# Patient Record
Sex: Male | Born: 1950
Health system: Southern US, Community
[De-identification: ages and names within clinical notes are randomized; demographics above are authoritative.]

## PROBLEM LIST (undated history)

## (undated) DIAGNOSIS — N179 Acute kidney failure, unspecified: Secondary | ICD-10-CM

## (undated) DIAGNOSIS — I10 Essential (primary) hypertension: Secondary | ICD-10-CM

## (undated) DIAGNOSIS — I219 Acute myocardial infarction, unspecified: Secondary | ICD-10-CM

## (undated) DIAGNOSIS — D649 Anemia, unspecified: Secondary | ICD-10-CM

## (undated) DIAGNOSIS — G629 Polyneuropathy, unspecified: Secondary | ICD-10-CM

## (undated) DIAGNOSIS — F32A Depression, unspecified: Secondary | ICD-10-CM

## (undated) DIAGNOSIS — M459 Ankylosing spondylitis of unspecified sites in spine: Secondary | ICD-10-CM

## (undated) DIAGNOSIS — I209 Angina pectoris, unspecified: Secondary | ICD-10-CM

## (undated) DIAGNOSIS — I2510 Atherosclerotic heart disease of native coronary artery without angina pectoris: Secondary | ICD-10-CM

## (undated) DIAGNOSIS — E785 Hyperlipidemia, unspecified: Secondary | ICD-10-CM

## (undated) DIAGNOSIS — M1990 Unspecified osteoarthritis, unspecified site: Secondary | ICD-10-CM

## (undated) DIAGNOSIS — H269 Unspecified cataract: Secondary | ICD-10-CM

## (undated) DIAGNOSIS — F419 Anxiety disorder, unspecified: Secondary | ICD-10-CM

## (undated) DIAGNOSIS — F329 Major depressive disorder, single episode, unspecified: Secondary | ICD-10-CM

## (undated) DIAGNOSIS — K219 Gastro-esophageal reflux disease without esophagitis: Secondary | ICD-10-CM

## (undated) HISTORY — PX: ACHILLES TENDON REPAIR: SUR1153

## (undated) HISTORY — DX: Acute kidney failure, unspecified: N17.9

## (undated) HISTORY — DX: Acute myocardial infarction, unspecified: I21.9

## (undated) HISTORY — PX: CORONARY ARTERY BYPASS GRAFT: SHX141

## (undated) HISTORY — PX: CARDIAC CATHETERIZATION: SHX172

## (undated) HISTORY — PX: CORONARY ANGIOPLASTY WITH STENT PLACEMENT: SHX49

## (undated) HISTORY — DX: Atherosclerotic heart disease of native coronary artery without angina pectoris: I25.10

## (undated) HISTORY — PX: TONSILLECTOMY: SUR1361

## (undated) HISTORY — DX: Hyperlipidemia, unspecified: E78.5

## (undated) HISTORY — DX: Anxiety disorder, unspecified: F41.9

## (undated) HISTORY — DX: Depression, unspecified: F32.A

## (undated) HISTORY — DX: Ankylosing spondylitis of unspecified sites in spine: M45.9

## (undated) HISTORY — DX: Major depressive disorder, single episode, unspecified: F32.9

## (undated) HISTORY — DX: Gastro-esophageal reflux disease without esophagitis: K21.9

## (undated) HISTORY — DX: Polyneuropathy, unspecified: G62.9

## (undated) HISTORY — DX: Unspecified cataract: H26.9

## (undated) HISTORY — PX: CATARACT EXTRACTION: SUR2

## (undated) HISTORY — PX: FINGER SURGERY: SHX640

## (undated) HISTORY — DX: Essential (primary) hypertension: I10

## (undated) HISTORY — DX: Anemia, unspecified: D64.9

---

## 1999-06-14 ENCOUNTER — Emergency Department (HOSPITAL_COMMUNITY): Admission: EM | Admit: 1999-06-14 | Discharge: 1999-06-14 | Payer: Self-pay | Admitting: Emergency Medicine

## 1999-06-24 ENCOUNTER — Emergency Department (HOSPITAL_COMMUNITY): Admission: EM | Admit: 1999-06-24 | Discharge: 1999-06-24 | Payer: Self-pay | Admitting: Emergency Medicine

## 2000-08-09 ENCOUNTER — Inpatient Hospital Stay (HOSPITAL_COMMUNITY): Admission: EM | Admit: 2000-08-09 | Discharge: 2000-08-14 | Payer: Self-pay | Admitting: Internal Medicine

## 2000-08-09 ENCOUNTER — Encounter: Payer: Self-pay | Admitting: Emergency Medicine

## 2000-09-09 ENCOUNTER — Encounter (HOSPITAL_COMMUNITY): Admission: RE | Admit: 2000-09-09 | Discharge: 2000-10-08 | Payer: Self-pay | Admitting: Psychiatry

## 2000-10-09 ENCOUNTER — Encounter (HOSPITAL_COMMUNITY): Admission: RE | Admit: 2000-10-09 | Discharge: 2000-10-27 | Payer: Self-pay | Admitting: Internal Medicine

## 2001-03-11 HISTORY — PX: KNEE ARTHROSCOPY W/ LASER: SHX1872

## 2004-05-02 ENCOUNTER — Ambulatory Visit: Payer: Self-pay | Admitting: Cardiology

## 2004-05-10 ENCOUNTER — Ambulatory Visit: Payer: Self-pay

## 2004-05-23 ENCOUNTER — Ambulatory Visit: Payer: Self-pay | Admitting: Cardiology

## 2004-05-23 ENCOUNTER — Inpatient Hospital Stay (HOSPITAL_COMMUNITY): Admission: AD | Admit: 2004-05-23 | Discharge: 2004-05-26 | Payer: Self-pay | Admitting: Cardiology

## 2004-06-04 ENCOUNTER — Ambulatory Visit: Payer: Self-pay | Admitting: Cardiology

## 2004-06-11 ENCOUNTER — Ambulatory Visit: Payer: Self-pay | Admitting: Cardiology

## 2004-09-18 ENCOUNTER — Ambulatory Visit: Payer: Self-pay | Admitting: Cardiology

## 2004-09-20 ENCOUNTER — Ambulatory Visit: Payer: Self-pay | Admitting: Cardiology

## 2004-11-14 ENCOUNTER — Ambulatory Visit: Payer: Self-pay

## 2004-11-14 ENCOUNTER — Ambulatory Visit: Payer: Self-pay | Admitting: Cardiology

## 2004-12-19 ENCOUNTER — Ambulatory Visit: Payer: Self-pay | Admitting: Cardiology

## 2005-06-17 ENCOUNTER — Ambulatory Visit: Payer: Self-pay | Admitting: Cardiology

## 2005-06-17 ENCOUNTER — Ambulatory Visit: Payer: Self-pay

## 2005-06-25 ENCOUNTER — Ambulatory Visit: Payer: Self-pay | Admitting: Cardiology

## 2006-06-09 ENCOUNTER — Ambulatory Visit: Payer: Self-pay

## 2006-06-09 LAB — CONVERTED CEMR LAB
Albumin: 3.7 g/dL (ref 3.5–5.2)
Bilirubin, Direct: 0.1 mg/dL (ref 0.0–0.3)
Direct LDL: 79.7 mg/dL
HDL: 46 mg/dL (ref >39.0)
Total Bilirubin: 0.7 mg/dL (ref 0.3–1.2)
Triglycerides: 237 mg/dL (ref 0–149)
VLDL: 47 mg/dL — ABNORMAL HIGH (ref 0–40)

## 2006-07-01 ENCOUNTER — Ambulatory Visit: Payer: Self-pay | Admitting: Cardiology

## 2006-07-08 ENCOUNTER — Ambulatory Visit: Payer: Self-pay | Admitting: Cardiology

## 2006-07-08 LAB — CONVERTED CEMR LAB
BUN: 8 mg/dL (ref 6–23)
Creatinine, Ser: 1 mg/dL (ref 0.4–1.5)
GFR calc non Af Amer: 82 mL/min
Potassium: 5.4 meq/L — ABNORMAL HIGH (ref 3.5–5.1)

## 2006-07-13 ENCOUNTER — Emergency Department (HOSPITAL_COMMUNITY): Admission: EM | Admit: 2006-07-13 | Discharge: 2006-07-13 | Payer: Self-pay | Admitting: Emergency Medicine

## 2006-07-16 ENCOUNTER — Encounter: Admission: RE | Admit: 2006-07-16 | Discharge: 2006-07-16 | Payer: Self-pay | Admitting: Sports Medicine

## 2007-06-08 ENCOUNTER — Ambulatory Visit: Payer: Self-pay

## 2007-06-08 LAB — CONVERTED CEMR LAB
ALT: 25 units/L (ref 0–53)
AST: 29 units/L (ref 0–37)
Albumin: 3.8 g/dL (ref 3.5–5.2)
Alkaline Phosphatase: 50 units/L (ref 39–117)
BUN: 9 mg/dL (ref 6–23)
CO2: 31 meq/L (ref 19–32)
Chloride: 105 meq/L (ref 96–112)
Cholesterol: 140 mg/dL (ref 0–200)
Creatinine, Ser: 0.9 mg/dL (ref 0.4–1.5)
GFR calc non Af Amer: 92 mL/min
Glucose, Bld: 86 mg/dL (ref 70–99)
LDL Cholesterol: 76 mg/dL (ref 0–99)
Total Protein: 7.3 g/dL (ref 6.0–8.3)
Triglycerides: 50 mg/dL (ref 0–149)

## 2007-06-26 ENCOUNTER — Ambulatory Visit: Payer: Self-pay | Admitting: Cardiology

## 2007-08-17 ENCOUNTER — Inpatient Hospital Stay (HOSPITAL_COMMUNITY): Admission: RE | Admit: 2007-08-17 | Discharge: 2007-08-18 | Payer: Self-pay | Admitting: Orthopedic Surgery

## 2008-06-15 DIAGNOSIS — I10 Essential (primary) hypertension: Secondary | ICD-10-CM | POA: Insufficient documentation

## 2008-06-15 DIAGNOSIS — K219 Gastro-esophageal reflux disease without esophagitis: Secondary | ICD-10-CM | POA: Insufficient documentation

## 2008-06-15 DIAGNOSIS — F329 Major depressive disorder, single episode, unspecified: Secondary | ICD-10-CM

## 2008-06-15 DIAGNOSIS — M459 Ankylosing spondylitis of unspecified sites in spine: Secondary | ICD-10-CM

## 2008-06-15 DIAGNOSIS — E785 Hyperlipidemia, unspecified: Secondary | ICD-10-CM | POA: Insufficient documentation

## 2008-06-15 DIAGNOSIS — F320 Major depressive disorder, single episode, mild: Secondary | ICD-10-CM

## 2008-06-15 DIAGNOSIS — I2510 Atherosclerotic heart disease of native coronary artery without angina pectoris: Secondary | ICD-10-CM

## 2008-06-15 HISTORY — DX: Major depressive disorder, single episode, mild: F32.0

## 2008-06-15 HISTORY — DX: Major depressive disorder, single episode, unspecified: F32.9

## 2008-06-16 ENCOUNTER — Ambulatory Visit: Payer: Self-pay | Admitting: Cardiology

## 2008-06-16 ENCOUNTER — Encounter: Payer: Self-pay | Admitting: Cardiology

## 2008-06-16 LAB — CONVERTED CEMR LAB
ALT: 23 units/L (ref 0–53)
AST: 27 units/L (ref 0–37)
Basophils Absolute: 0 10*3/uL (ref 0.0–0.1)
Bilirubin, Direct: 0 mg/dL (ref 0.0–0.3)
CO2: 29 meq/L (ref 19–32)
Calcium: 8.8 mg/dL (ref 8.4–10.5)
Chloride: 104 meq/L (ref 96–112)
Creatinine, Ser: 0.9 mg/dL (ref 0.4–1.5)
Direct LDL: 70.2 mg/dL
Eosinophils Relative: 6 % — ABNORMAL HIGH (ref 0.0–5.0)
Lymphocytes Relative: 38.9 % (ref 12.0–46.0)
Monocytes Relative: 12 % (ref 3.0–12.0)
Neutrophils Relative %: 42.6 % — ABNORMAL LOW (ref 43.0–77.0)
Platelets: 186 10*3/uL (ref 150.0–400.0)
RDW: 12.1 % (ref 11.5–14.6)
Sodium: 139 meq/L (ref 135–145)
TSH: 2.89 microintl units/mL (ref 0.35–5.50)
Total Bilirubin: 0.6 mg/dL (ref 0.3–1.2)
Total CHOL/HDL Ratio: 3
Triglycerides: 210 mg/dL — ABNORMAL HIGH (ref 0.0–149.0)
VLDL: 42 mg/dL — ABNORMAL HIGH (ref 0.0–40.0)
WBC: 3.6 10*3/uL — ABNORMAL LOW (ref 4.5–10.5)

## 2008-06-20 ENCOUNTER — Telehealth: Payer: Self-pay | Admitting: Cardiology

## 2008-06-21 ENCOUNTER — Telehealth: Payer: Self-pay | Admitting: Cardiology

## 2008-06-22 ENCOUNTER — Telehealth: Payer: Self-pay | Admitting: Cardiology

## 2008-06-27 ENCOUNTER — Telehealth: Payer: Self-pay | Admitting: Cardiology

## 2009-06-28 ENCOUNTER — Telehealth (INDEPENDENT_AMBULATORY_CARE_PROVIDER_SITE_OTHER): Payer: Self-pay

## 2009-06-29 ENCOUNTER — Encounter (HOSPITAL_COMMUNITY): Admission: RE | Admit: 2009-06-29 | Discharge: 2009-09-12 | Payer: Self-pay | Admitting: Cardiology

## 2009-06-29 ENCOUNTER — Ambulatory Visit: Payer: Self-pay | Admitting: Cardiology

## 2009-06-29 ENCOUNTER — Ambulatory Visit: Payer: Self-pay | Admitting: Internal Medicine

## 2009-06-29 ENCOUNTER — Ambulatory Visit: Payer: Self-pay

## 2009-07-05 ENCOUNTER — Telehealth: Payer: Self-pay | Admitting: Cardiology

## 2009-07-17 ENCOUNTER — Encounter: Payer: Self-pay | Admitting: Cardiology

## 2009-07-18 ENCOUNTER — Ambulatory Visit: Payer: Self-pay | Admitting: Cardiology

## 2010-01-22 ENCOUNTER — Ambulatory Visit: Payer: Self-pay | Admitting: Cardiology

## 2010-02-06 ENCOUNTER — Telehealth: Payer: Self-pay | Admitting: Cardiology

## 2010-02-06 LAB — CONVERTED CEMR LAB
ALT: 104 units/L — ABNORMAL HIGH (ref 0–53)
AST: 97 units/L — ABNORMAL HIGH (ref 0–37)
Alkaline Phosphatase: 62 units/L (ref 39–117)
Bilirubin, Direct: 0.3 mg/dL (ref 0.0–0.3)
Cholesterol: 158 mg/dL (ref 0–200)
Total Bilirubin: 1.1 mg/dL (ref 0.3–1.2)
Total Protein: 6.9 g/dL (ref 6.0–8.3)

## 2010-02-14 ENCOUNTER — Telehealth: Payer: Self-pay | Admitting: Cardiology

## 2010-03-28 ENCOUNTER — Encounter: Payer: Self-pay | Admitting: Cardiovascular Disease

## 2010-04-08 LAB — CONVERTED CEMR LAB
ALT: 59 units/L — ABNORMAL HIGH (ref 0–53)
AST: 44 units/L — ABNORMAL HIGH (ref 0–37)
Albumin: 3.7 g/dL (ref 3.5–5.2)
Alkaline Phosphatase: 48 units/L (ref 39–117)
CO2: 28 meq/L (ref 19–32)
Calcium: 9.2 mg/dL (ref 8.4–10.5)
Cholesterol: 135 mg/dL (ref 0–200)
Creatinine, Ser: 0.9 mg/dL (ref 0.4–1.5)
HDL: 60.8 mg/dL (ref >39.00)
Sodium: 142 meq/L (ref 135–145)
Total CHOL/HDL Ratio: 2
Total Protein: 7.1 g/dL (ref 6.0–8.3)
Triglycerides: 66 mg/dL (ref 0.0–149.0)

## 2010-04-09 ENCOUNTER — Other Ambulatory Visit: Payer: Self-pay | Admitting: Cardiovascular Disease

## 2010-04-09 ENCOUNTER — Ambulatory Visit
Admission: RE | Admit: 2010-04-09 | Discharge: 2010-04-09 | Payer: Self-pay | Source: Home / Self Care | Attending: Cardiovascular Disease | Admitting: Cardiovascular Disease

## 2010-04-09 LAB — HEPATIC FUNCTION PANEL
ALT: 63 U/L — ABNORMAL HIGH (ref 0–53)
Albumin: 3.4 g/dL — ABNORMAL LOW (ref 3.5–5.2)
Bilirubin, Direct: 0.1 mg/dL (ref 0.0–0.3)
Total Protein: 6.4 g/dL (ref 6.0–8.3)

## 2010-04-09 LAB — LIPID PANEL
Cholesterol: 136 mg/dL (ref 0–200)
HDL: 45.4 mg/dL (ref >39.00)
Triglycerides: 232 mg/dL — ABNORMAL HIGH (ref 0.0–149.0)

## 2010-04-09 LAB — PSA: PSA: 9.34 ng/mL — ABNORMAL HIGH (ref 0.10–4.00)

## 2010-04-12 NOTE — Progress Notes (Signed)
Summary: pt returning heather's call  Phone Note Call from Patient   Caller: Patient [phone] Reason for Call: Talk to Nurse Summary of Call: pt returning heather's call-pls call Initial call taken by: Glynda Jaeger,  February 14, 2010 3:41 PM  Follow-up for Phone Call        Pt. had question regarding starting medication for ED. I advised him that usually his PCP should see him for this problem but to be cautious b/c he should not take erectile dysfunction meds. with his nitrates. He is aware and will dicuss this with his PCP. Whitney Maeola Sarah RN  February 14, 2010 3:56 PM  Follow-up by: Whitney Maeola Sarah RN,  February 14, 2010 3:56 PM

## 2010-04-12 NOTE — Progress Notes (Signed)
Summary: returning call  Phone Note Call from Patient Call back at Home Phone [phone]   Caller: Patient Reason for Call: Talk to Nurse Summary of Call: returning call  Initial call taken by: Migdalia Dk,  July 05, 2009 1:55 PM  Follow-up for Phone Call        I called and spoke with the pt. Follow-up by: Sherri Rad, RN, BSN,  July 05, 2009 2:11 PM
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
## 2010-04-12 NOTE — Progress Notes (Signed)
Summary: pt returned call  Phone Note Call from Patient   Caller: Patient [phone] Reason for Call: Talk to Nurse Summary of Call: pt returned call Initial call taken by: Glynda Jaeger,  February 06, 2010 11:41 AM  Follow-up for Phone Call        I spoke with the pt. Follow-up by: Sherri Rad, RN, BSN,  February 06, 2010 12:09 PM

## 2010-04-12 NOTE — Miscellaneous (Signed)
Summary: added stress test results  Clinical Lists Changes  Observations: Added new observation of NUCLEAR NOS:   Overall Impression   Exercise Capacity: Good exercise capacity. BP Response: Hypertensive blood pressure response. Clinical Symptoms: No chest pain ECG Impression: No significant ST segment change suggestive of ischemia. Overall Impression: Normal stress nuclear study.  (06/29/2009 15:36)      Nuclear Study  Procedure date:  06/29/2009  Findings:        Overall Impression   Exercise Capacity: Good exercise capacity. BP Response: Hypertensive blood pressure response. Clinical Symptoms: No chest pain ECG Impression: No significant ST segment change suggestive of ischemia. Overall Impression: Normal stress nuclear study.

## 2010-04-12 NOTE — Progress Notes (Signed)
Summary: Nuc. Pre-Procedure  Phone Note Outgoing Call Call back at [facility] Phone [phone]   Call placed by: Irean Hong, RN,  June 28, 2009 3:29 PM Summary of Call: Reviewed information on Myoview Information Sheet (see scanned document for further details).  Spoke with patient. The patient reguested follow-up lab work tomorrow. I spoke with Dr. Leonard Schwartz Brodie's nurse, and Labs added for BMP, lipid and liver profile.My Madariaga,RN.     Nuclear Med Background Indications for Stress Test: Evaluation for Ischemia, Stent Patency, PTCA Patency   History: Angioplasty, Heart Catheterization, Myocardial Infarction, Myocardial Perfusion Study, Stents  History Comments: '02  STEMI>CFX Stent, and PTCA ostial CFX.  '06 Cath: EF=60%, CFX 90%,80%, RCA 95% > Stents CFX,RCA with residual LAD of 70%. 3/09 MPS:(-) ischemia, EF=62%.      Nuclear Pre-Procedure Cardiac Risk Factors: History of Smoking, Hypertension, Lipids Height (in): 74

## 2010-04-12 NOTE — Assessment & Plan Note (Signed)
Summary: Cardiology Nuclear Study  Nuclear Med Background Indications for Stress Test: Evaluation for Ischemia, Stent Patency, PTCA Patency   History: Angioplasty, Heart Catheterization, Myocardial Infarction, Myocardial Perfusion Study, Stents  History Comments: '02  STEMI>CFX Stent, and PTCA ostial CFX.  '06 Cath: EF=60%, CFX 90%,80%, RCA 95% > Stents CFX,RCA with residual LAD of 70%. 3/09 MPS:(-) ischemia, EF=62%.   Symptoms: Palpitations    Nuclear Pre-Procedure Cardiac Risk Factors: History of Smoking, Hypertension, Lipids Caffeine/Decaff Intake: None NPO After: 8:00 PM Lungs: clear IV 0.9% NS with Angio Cath: 20g     IV Site: (R) AC IV Started by: Irean Hong RN Chest Size (in) 46     Height (in): 74 Weight (lb): 229 BMI: 29.51 Tech Comments: Metoprolol held x 48 hrs.  Nuclear Med Study 1 or 2 day study:  1 day     Stress Test Type:  Stress Reading MD:  Arvilla Meres, MD     Referring MD:  B.Brodie Resting Radionuclide:  Technetium 54m Tetrofosmin     Resting Radionuclide Dose:  11.0 mCi  Stress Radionuclide:  Technetium 33m Tetrofosmin     Stress Radionuclide Dose:  33.0 mCi   Stress Protocol Exercise Time (min):  9:00 min     Max HR:  142 bpm     Predicted Max HR:  161 bpm  Max Systolic BP: 201 mm Hg     Percent Max HR:  88.20 %     METS: 10.40 Rate Pressure Product:  16109    Stress Test Technologist:  Milana Na EMT-P     Nuclear Technologist:  Domenic Polite CNMT  Rest Procedure  Myocardial perfusion imaging was performed at rest 45 minutes following the intravenous administration of Myoview Technetium 59m Tetrofosmin.  Stress Procedure  The patient exercised for 9:00. The patient stopped due to fatigue and denied any chest pain.  There were no significant ST-T wave changes and rare pacs.  Myoview was injected at peak exercise and myocardial perfusion imaging was performed after a brief delay.  QPS Raw Data Images:  Normal; no motion artifact;  normal heart/lung ratio. Stress Images:  There is normal uptake in all areas. Rest Images:  Normal homogeneous uptake in all areas of the myocardium. Subtraction (SDS):  Normal Transient Ischemic Dilatation:  .93  (Normal <1.22)  Lung/Heart Ratio:  .34  (Normal <0.45)  Quantitative Gated Spect Images QGS EDV:  94 ml QGS ESV:  35 ml QGS EF:  63 % QGS cine images:  Normal  Findings Normal nuclear study      Overall Impression  Exercise Capacity: Good exercise capacity. BP Response: Hypertensive blood pressure response. Clinical Symptoms: No chest pain ECG Impression: No significant ST segment change suggestive of ischemia. Overall Impression: Normal stress nuclear study.  Appended Document: Cardiology Nuclear Study The pt is aware of his results.

## 2010-04-18 NOTE — Miscellaneous (Signed)
Summary: Orders Update  Clinical Lists Changes  Orders: Added new Test order of TLB-PSA (Prostate Specific Antigen) (84153-PSA) - Signed 

## 2010-04-20 ENCOUNTER — Encounter: Payer: Self-pay | Admitting: Cardiovascular Disease

## 2010-05-02 NOTE — Medication Information (Signed)
Summary: Medical Specialties  Medical Specialties   Imported By: Lenard Forth 04/27/2010 13:27:43  _____________________________________________________________________  External Attachment:    Type:   Image     Comment:   External Document

## 2010-05-29 NOTE — Medication Information (Signed)
Summary: Drug Interaction Notification   Drug Interaction Notification   Imported By: Roderic Ovens 05/21/2010 09:20:33  _____________________________________________________________________  External Attachment:    Type:   Image     Comment:   External Document

## 2010-07-02 ENCOUNTER — Telehealth: Payer: Self-pay | Admitting: Cardiovascular Disease

## 2010-07-02 MED ORDER — CLOPIDOGREL BISULFATE 75 MG PO TABS: 75.0000 mg | ORAL_TABLET | Freq: Every day | ORAL | Status: DC

## 2010-07-02 NOTE — Telephone Encounter (Signed)
Script sent  
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
## 2010-07-24 NOTE — H&P (Signed)
NAME:  RANKIN, COOLMAN                ACCOUNT NO.:  [id number]   MEDICAL RECORD NO.:  [id number]          PATIENT TYPE:  OIB   LOCATION:  5032                         FACILITY:  [facility]   PHYSICIAN:  Gregory Post, MD    DATE OF BIRTH:  1950/09/12   DATE OF ADMISSION:  08/17/2007  DATE OF DISCHARGE:                              HISTORY & PHYSICAL   CHIEF COMPLAINT:  Left leg swelling and drainage.   PRIMARY CARE PHYSICIAN:  Gregory Hansen. Gregory Seat, MD.   HISTORY:  [name] is a 60 year old gentleman who was  weedwacking this past weekend and had a rock or something hit him in the  lower extremity.  He has been followed at Urgent Care and has had  increasing cellulitis, redness, and last night had a significant degree  of purulent drainage come from his left lower extremity.  He reports  recent subjective chills.  He says that the redness has been getting  worse and spreading.  He has had increasing pain.  He was seen in Urgent  Care and then repeat visit at Urgent Care demonstrated worsening of his  lower extremity, and he was referred to our clinic for emergent  evaluation.   PAST MEDICAL HISTORY:  Significant for a cardiac event during which he  underwent embolization and stent placement.  He denies any diabetes.   FAMILY HISTORY:  Positive for cardiac disease and both of his brothers  have had cardiac events.   SOCIAL HISTORY:  He is a nonsmoker.   REVIEW OF SYSTEMS:  He denies any other review of systems and  specifically no recent chest pain or shortness of breath.  Musculoskeletal review of systems is as indicated in the history of  present illness as is the constitutional review of systems.  All other  review of systems are negative.   PHYSICAL EXAM:  GENERAL:  He is lying on a gurney in no acute distress.  NECK:  He has full range of motion with no radiculopathy and a midline  trachea.  CARDIAC:  He has a regular rate and rhythm and he has good perfusion  throughout.   He has minimal peripheral edema, but he has some soft  tissue swelling over the left leg.  RESPIRATORY:  He has no cyanosis.  GI:  He has no hepatosplenomegaly and his abdomen is soft and nontender.  LYMPHATIC:  He has no lymphadenopathy of his neck or axilla.  PSYCHIATRIC:  His mood and affect are appropriate.  NEUROLOGIC:  His sensation is intact distally.  EXTREMITIES:  Left lower extremity exam, he has cellulitis from about  the level of the knee down to the level of the ankle.  He has a punctate  wound that is scabbed over at the level of the mid calf on the lateral  aspect.  He has some fluctuance in this area as well.   He has an x-ray from outside, which per report indicates the presence of  a foreign body.   IMPRESSION:  1. Left leg cellulitis with abscess formation and the presence of a  foreign body.  2. Coronary artery disease status Hansen stent placement on Plavix.   PLAN:  Gregory Hansen requires emergent irrigation and debridement.  We are  going to start antibiotics postoperatively.  We will take cultures  intraoperatively.  It is not clear to me whether he has been on  antibiotics, but I will confirm this with Dr. Neva Hansen.  Nevertheless, he  needs an I&D with a removal of foreign body.  He may be in the hospital  for a period of a couple days depending on what I find intraoperatively.  He will certainly need IV antibiotics at least overnight.  We will plan  to proceed emergently to the operating room for emergent surgery with  risk factors.      Gregory Post, MD  Electronically Signed     JPL/MEDQ  D:  08/17/2007  T:  08/18/2007  Job:  [id number]   cc:   Gregory Hansen, M.D.

## 2010-07-25 ENCOUNTER — Other Ambulatory Visit: Payer: Self-pay | Admitting: *Deleted

## 2010-07-25 MED ORDER — ROSUVASTATIN CALCIUM 20 MG PO TABS: 20.0000 mg | ORAL_TABLET | Freq: Every day | ORAL | Status: DC

## 2010-07-26 ENCOUNTER — Telehealth: Payer: Self-pay | Admitting: Cardiovascular Disease

## 2010-07-26 NOTE — Telephone Encounter (Signed)
Clarify directions for plavix- at [facility] [phone] ref [id number]

## 2010-07-26 NOTE — Telephone Encounter (Signed)
Called [facility] and advised that Plavix is 75mg  1 per day. Can have #90 with 3 refills.

## 2010-07-27 NOTE — Discharge Summary (Signed)
[place]  Patient:    Gregory Hansen, Gregory Hansen                         MRN: [id number] Adm. Date:  08/09/00 Disc. Date: 08/14/00 Attending:  Doylene Canning. Ladona Ridgel, M.D. [facility] Dictator:   Gene Serpe, P.A. CC:         Dr. Quincy Sheehan                  Referring Physician Discharge [place]  PROCEDURES: 1. Stent RCA - August 11, 2000. 2. Stent OM/PTCA circumflex - August 13, 2000.  REASON FOR ADMISSION:  Please refer to dictated admission note.  LABORATORY DATA:  Cardiac enzymes:  Peak CPK 170/40 (23.7%), peak troponin I 2.08.  Lipid profile:  Cholesterol 162, triglyceride 101, HDL 32, LDL 110. CBC and BMP normal at discharge.  Homocysteine normal.  Admission chest x-ray:  No acute findings.  HOSPITAL COURSE:  Following transfer from [facility] patient was maintained on intravenous heparin and Integrilin for management of aborted inferior myocardial infarction.  Cardiac enzymes were consistent with non-Q-wave myocardial infarction.  Initial coronary angiogram revealed three-vessel CAD notable for 70% distal, diffuse LAD; 70-80% ostial, 80% mid circumflex; 95% proximal, 50% mid, 50% distal RCA.  LV function preserved (EF 55%).  Dr. Juanda Chance proceeded with successful stenting of the 95% proximal RCA lesion to less than 10% residual stenosis with plans to have patient return in 48 hours for a staged intervention of the circumflex lesions.  Patient returned to laboratory on June 5 for successful stenting of the 90% OM-CFX and "cutting balloon" PTCA of the 80% ostial circumflex by Dr. Juanda Chance to 0% and less than 20% residual stenosis respectively.  No noted complications.  Patient cleared for discharge the following morning in hemodynamically-stable condition.  NEW MEDICATIONS:  Plavix (x 1 month), aspirin, Zocor, and Toprol.  DISCHARGE MEDICATIONS: 1. Plavix 75 mg q.d. (x 1 month). 2. Aspirin 325 mg q.d. 3. Zocor 20 mg q.h.s. 4. Toprol-XL 50 mg q.d. 5.  Serzone 400 mg b.i.d. 6. Neurontin 800 mg b.i.d. 7. Tagamet 150 mg b.i.d. 8. Nitrostat 0.4 mg as directed.  INSTRUCTIONS:  ACTIVITY:  No driving x 1 week.  May return to work in two weeks (part-time). No international travel.  DIET:  Low fat/cholesterol diet.  WOUND CARE:  Call the office if there is any bleeding/swelling of the groin.  FOLLOW-UP:  Patient scheduled to follow up with Dr. Leonard Schwartz. [facility]/P.A. clinic on Tuesday, June 25 at 10:15 a.m.  DISCHARGE DIAGNOSES: 1. Aborted inferior myocardial infarction.    a. Elective stent right coronary artery - August 11, 2000.    b. Stent obtuse marginal/percutaneous transluminal coronary angioplasty       circumflex - August 13, 2000.    c. Normal left ventricular function.    d. Enrolled in [employer] trial. 2. Sinus bradycardia. 3. Dislipidemia. 4. History of tobacco. 5. Gastroesophageal reflux disease. 6. Ankylosing spondylitis.DD:  08/14/00 TD:  08/14/00 Job: [id number]

## 2010-07-27 NOTE — Assessment & Plan Note (Signed)
[facility]                            CARDIOLOGY OFFICE NOTE   NAME:Gregory Hansen, Gregory Hansen                       MRN:          [id number]  DATE:07/01/2006                            DOB:          [birth year]    PRIMARY CARE PHYSICIAN:  Dr. Conchita Paris, in [place].   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  In 2002, he had  a heart attack and had a bare metal stent placed in the proximal right  coronary artery.  In 2006, he had stenting of the distal rectum with a  drug-eluting stent and stenting of the marginal branch to the circumflex  artery and cutting balloon angioplasty of the ostium of the circumflex  artery.  The stent in the marginal branch was a drug-eluting stent.   He has done quite well since that time, has had no recent chest pain,  shortness of breath or palpitations.  He is quite active and rides a  recumbent bike outside fairly long distances.  He has had no symptoms  related to this.   PAST MEDICAL HISTORY:  Is noted for:  1. Hyperlipidemia.  2. Depression.   CURRENT MEDICATIONS:  Toprol, aspirin, Niaspan, multivitamins, Prilosec,  Plavix, Crestor and Serzone.   EXAMINATION TODAY:  The blood pressure is 140/80, the pulse 59 and  regular.  There is no venous distention.  The carotid pulses were full  without bruits.  CHEST:  Clear.  CARDIAC RHYTHM:  Regular.  There are no murmurs or gallops.  ABDOMEN:  Soft without organomegaly.  Peripheral pulses were full, there  was no peripheral edema.   He had a recent Myoview scan, showed good exercise tolerance with no  chest pain or ECG changes and a negative scan.  His recent lipid profile  showed an HDL of 46, an LDL of 79, a total cholesterol of 151 and  triglyceride of 237 with normal liver function tests.   IMPRESSION:  1. Coronary artery disease status multiple percutaneous interventions  with now stable negative Myoview scans.  2. Hyperlipidemia.  3. Hypertension.  4. History of depression.   RECOMMENDATIONS:  I think Mr. Rago is doing quite well.  His blood  pressure is somewhat elevated and was also elevated with his stress  test, so I think it should be treated and since there is also some  probable additional benefit from ACE inhibitors in patients with  vascular disease will plan to start him on Altace 5 mg a day and get a  Chem 7 in 1 week.  I will plan to see him back in a month and will do a  rest stress exercise Myoview scan prior to that visit as well as labs.     Bruce Elvera Lennox Juanda Chance, MD, [facility]  Electronically Signed    BRB/MedQ  DD: 07/01/2006  DT: 07/01/2006  Job #: [id number]   cc:   Conchita Paris

## 2010-07-27 NOTE — Cardiovascular Report (Signed)
NAME:  Gregory Hansen, Gregory Hansen NO.:  [id number]   MEDICAL RECORD NO.:  [id number]          PATIENT TYPE:  OIB   LOCATION:  6529                         FACILITY:  [facility]   PHYSICIAN:  Charlies Constable, M.D. 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  He developed recurrent angina and had an abnormal  Cardiolite scan and underwent a cath and stenting of a tight distal light  right lesion two days ago.  He was brought back today for intervention on  the circumflex marginal and ostial lesion of the circumflex artery which  were treated with cutting balloon angioplasty in 2003.   PROCEDURE:  The procedure was performed via the right femoral artery using  an arterial sheath and a CLS 4 [place] guiding catheter with side holes.  We  crossed the lesion in the ostium and the marginal branch of the circumflex  artery with a Prowater wire without difficulty.  The patient was given  Angiomax bolus and infusion and had been on Plavix.  We predilated the  marginal lesion with a 2.5 x 15 mm Maverick performing one inflation up to 8  atmospheres for 30 seconds.  We then deployed a 2.5 x 20 mm Taxus stent  deploying this with one inflation of 12 atmospheres by 30 seconds.  We then  post dilated with a 2.75 x 15 mm quantum Maverick performing two inflations  up to 16 atmospheres for 30 seconds.   We then IVUSED the circumflex lesion with the Atlantis IVUS catheter.  The  patient had some pain during the IVUS  procedure so she was treated with  intracoronary nitroglycerin.  This demonstrated that the ostial lesion was  moderately tight with dimensions of about 1.8 x 2.1 and a vessel that was 40  in size.  The lumen downstream was about 30  and we elected to treat this  with a 30 x 6 mm cutting balloon.  We performed three inflations up to 8  atmospheres for 30 seconds.  Repeat diagnostics was then performed with a  guiding catheter.  The patient tolerated the procedure well and left he  laboratory in satisfactory condition.   RESULTS:  The circumflex marginal lesion was initially 90%.  Following  stenting this improved to 0%.   The ostial lesion of the circumflex artery was initially 80%.  Following  cutting balloon angioplasty was improved to 20%.   CONCLUSION:  1.  Successful stenting of the marginal branch of the circumflex artery      using a Taxus drug-eluting stent and improvement in central narrowing      from 90% to 0%  2.  Successful cutting balloon angioplasty of the ostial lesion of the      circumflex artery with improvement in central narrowing from 80% to 20%.   DISPOSITION:  The patient __________  observation.  The patient will remain  on Plavix for one year.      BB/MEDQ  D:  05/25/2004  T:  05/25/2004  Job:  [id number]   cc:   Conchita Paris  [address]  [place]  [place] [address]  Fax: [phone]

## 2010-07-27 NOTE — H&P (Signed)
[place]  Patient:    Gregory Hansen, Gregory Hansen                         MRN: [id number] Adm. Date:  [id number] Attending:  Lewayne Bunting Dictator:   Abelino Derrick, P.A.C. [facility]                         History and Physical  HISTORY OF PRESENT ILLNESS:  Mr. Gassman is a 60 year old male followed by Dr. Jimmie Molly. [place], with a history of ankylosing spondylitis, who was admitted to [facility] Emergency Room with an acute MI.  He presented to [facility] Emergency Room with EKG changes that included some ST elevation inferiorly. He was treated with heparin, aspirin and nitrates and had a drop in blood pressure and heart rate transiently.  He was treated with atropine and had resolution of his chest pain.  He was transferred to [facility] where he was pain-free and his EKG is back to baseline.  PAST MEDICAL HISTORY:  Remarkable for ankylosing spondylitis, ileitis and arthritis.  MEDICATIONS AT HOME: 1. Serzone 400 mg b.i.d. 2. Neurontin 800 mg b.i.d.  ALLERGIES:  He has no known drug allergies.  SOCIAL HISTORY:  He has been married twice and divorced once.  He smoked 2 packs a day for 20 years, quit about 15 years ago.  FAMILY HISTORY:  Remarkable in that his father died of an MI and mother died of breast cancer.  One brother had an MI.  REVIEW OF SYSTEMS:  Remarkable for rare reflux, some DJD symptoms in his low back.  No history of GI bleeding or peptic ulcer disease or stroke or renal failure.  PHYSICAL EXAMINATION:  GENERAL:  Patient is a well-developed, well-nourished male in no acute distress.  VITAL SIGNS:  Blood pressure 106/72.  Pulse 80.  Respirations 20.  HEENT:  Normocephalic.  Extraocular movements are intact.  Sclerae are nonicteric.  Lids and conjunctivae are within normal limits.  NECK:  Without bruit and without JVD.  CHEST:  Clear to auscultation and percussion.  CARDIAC:  Regular rate and rhythm without murmur, rub or gallop.  Normal  S1 and S2.  There is an S4 noted.  ABDOMEN:  Nontender.  No hepatosplenomegaly.  EXTREMITIES:  Without edema.  Distal pulses are 2+/4 bilaterally.  NEUROLOGIC:  Grossly intact.  He is awake, alert, oriented and cooperative.  LABORATORY DATA:  EKG shows normal sinus rhythm now with no significant ST changes.  Labs are pending.  IMPRESSION: 1. Aborted diaphragmatic myocardial infarction. 2. Ankylosing spondylitis. 3. History of reflux. 4. Ex-smoker.  PLAN:  Patient will be continued on IV heparin, integrilin and aspirin.  Will add low-dose beta blocker.  He will be catheterized Monday. DD:  08/10/00 TD:  08/11/00 Job: [id number]

## 2010-07-27 NOTE — Cardiovascular Report (Signed)
NAMEEVERETT, EHRLER                ACCOUNT NO.:  [id number]   MEDICAL RECORD NO.:  [id number]          PATIENT TYPE:  OIB   LOCATION:  2899                         FACILITY:  [facility]   PHYSICIAN:  Charlies Constable, M.D. [facility] DATE OF BIRTH:  08-03-1950   DATE OF PROCEDURE:  05/23/2004  DATE OF DISCHARGE:                              CARDIAC CATHETERIZATION   PROCEDURE PERFORMED:  Cardiac catheterization.   CARDIOLOGIST:  Charlies Constable, M.D.   CLINICAL HISTORY:  Mr. Spiker is 60 years old who is quite active with  biking and other sport's activities.  He had a stenting of the right  coronary artery in 2002 and cutting balloon angioplasty of the ostium,  circumflex and a circumflex marginal vessel in 2002.  Recently he had  symptoms suggestive of angina and a Cardiolite scan, which suggested  inferior ischemia; and, he was brought in for evaluation with angiography.   DESCRIPTION OF PROCEDURE:  The procedure was performed via the right femoral  artery using arterial sheath and 6 French preformed coronary catheters.  A  femoral arterial puncture was performed and Omnipaque was used.   After completion of the diagnostic study and after reviewing the filming  with Dr. Samule Ohm we made a decision to proceed with staged intervention.  We  planned to do the right coronary artery today and the circumflex artery in a  couple of days.   The patient was given Angiomax bolus and infusion, and 300 mg of Plavix.  We  used a JR-4 6 [place] guiding catheter with side holes and a ProWater wire.  We crossed the lesion in the distal right coronary artery with the wire  without difficulty.  We repeat dilated with a 2.5 x 50 mm Maverick  performing two inflations up to eight atmospheres for 30 seconds.  We then  deployed a 3.0 x 18 mm Cypher stent in the distal right coronary overlapping  the posterior descending branch.  We deployed this with one inflation at 14  atmospheres for 30 seconds.  The posterior  descending branch arose at near  right angles and appeared to have an ostium that was free of disease.  We  then post dilated the stent with a 3.5 x 50 Quantum Maverick performing two  inflations up 14 atmospheres for 30 seconds.  Repeat studies were then  performed through the guiding catheter.   The patient tolerated the procedure well and left the laboratory in  satisfactory condition.   RESULTS:   ANGIOGRAPHIC DATA:  Left Main Coronary Artery:  The left main coronary  artery is free of significant disease.   Left Anterior Descending Artery:  The left anterior descending artery was  diffusely irregular with quite a large amount of plaque.  The LAD gave rise  to two diagonal branches and two septal perforators.  There was 70%  narrowing in the proximal LAD, which overlapped the first diagonal branch.  There was 70% narrowing in the distal LAD after the second diagonal branch.   Circumflex Artery:  The circumflex artery gave rise to a large marginal and  small AV branch.  There was 70-80% stenosis in the ostium of the circumflex  artery at the previous PTCA site.  There was 90% stenosis in the first part  of the marginal branch at the previous PTCA site.   Right Coronary Artery:  The right coronary artery is a large dominant vessel  that supplied a conus branch, two right ventricular branches, the posterior  descending branch and three posterolateral branches.  There was less than  10% narrowing in the stent site in the proximal right coronary artery.  There was 40% narrowing in the midvessel.  There was 95% narrowing in the  distal vessel just after the posterior descending branch.   VENTRICULOGRAPHIC DATA:  Left Ventriculogram:  The left ventriculogram  performed in the RAO projection showed good wall motion with no areas of  hypokinesis.  The estimated ejection fraction was 60%.   Following stenting of the lesion in the distal right coronary artery the  stenosis improved from  95% to 0%.  The posterior descending branch was  pinched some.  There did not appear to be any major ostial disease initially  and there was about 50 narrowing at the end of the procedure.   CONCLUSION:  1.  Coronary artery disease status post prior percutaneous coronary      interventions as described above with 70% proximal and 70% distal      stenoses in the left anterior descending artery, 70-80% ostial stenosis      in the circumflex artery with 90% Steri-strips in the first marginal      branch, less than 10% narrowing at the stent site in the proximal right      coronary artery with 95% stenosis in the distal right coronary artery      and normal left ventricular function.  2.  Successful stenting of the lesion in the distal right coronary artery in      _________ percent narrowing from 95% to 0%.   DISPOSITION:  The patient was returned to __________ for further  observation.  We will plan staged procedure of the circumflex artery on  Friday and will considering IVUS of the circumflex artery as well.      BB/MEDQ  D:  05/23/2004  T:  05/24/2004  Job:  [id number]   cc:   Conchita Paris, M.D.  [place], [employer]

## 2010-07-27 NOTE — Discharge Summary (Signed)
NAME:  Gregory Hansen, Gregory Hansen                ACCOUNT NO.:  [id number]   MEDICAL RECORD NO.:  [id number]          PATIENT TYPE:  OIB   LOCATION:  6529                         FACILITY:  [facility]   PHYSICIAN:  Dorian Pod, NP    DATE OF BIRTH:  [birth year]   DATE OF ADMISSION:  05/23/2004  DATE OF DISCHARGE:  05/24/2004                                 DISCHARGE SUMMARY   DISCHARGING DIAGNOSIS:  Status post cardiac catheterization showing coronary  artery disease, status post prior percutaneous coronary interventions, with  70% proximal and 70% distal stenosis in the left anterior descending, 70-80%  ostial stenosis in the circumflex artery, with 90% __________ in the first  marginal branch, less than 10% narrowing at the stent site in the proximal  right coronary artery, with 95% stenosis in the distal right coronary  artery, and normal left ventricular function.  Successful stenting of the  lesion in the distal right coronary artery, with a 95% narrowing decreased  to 0%.  This procedure was done on May 23, 2004.  On May 25, 2004, the  patient returned to the catheterization lab, with placement of a stent to  the obtuse marginal, and a percutaneous transluminal coronary angioplasty to  the circumflex ostium, with intravascular ultrasound, circumflex obtuse  marginal decreased from 90% to 0, circumflex ostium 80% to 20%.   PAST MEDICAL HISTORY:  1.  Documented coronary artery disease, with stent in the right coronary      artery in 2002, followed by staged cutting balloon angioplasty to the      ostium of the circumflex artery and stenting of the marginal branch.  2.  Hyperlipidemia.  3.  Gastroesophageal reflux disease.  4.  Ankylosing spondylitis.  5.  Intolerance to LIPITOR and ZOCOR.  6.  Status post stress test Myoview study on May 10, 2004 which was      clinically positive for ischemia.   DISPOSITION:  Patient being discharged home on Plavix 75 mg daily x 1 year;  aspirin 325 mg  daily for at least one year; Crestor 10 mg daily, as  previously; Niacin 1,000 mg at bedtime, as previously; Serzone, as  previously; Toprol XL 50 mg daily, as previously; Prilosec, as previously  taken.   PAIN MANAGEMENT:  1.  Tylenol for general discomfort.  2.  Nitroglycerin for chest discomfort.   ACTIVITY:  No driving x 2 days.  No lifting over 10 pounds x 1 week.   He is to follow a lowfat, low salt diet.   WOUND CARE:  Gently clean catheterization site with soap and water.  No tub  bathing x 1 week.   He is to call our office for any fever or any pain or swelling from  catheterization site.  He will have a follow-up appointment with Dr. Juanda Chance  in one to two weeks at the [facility] office.  Our office will  call the patient with appointment.   HOSPITAL COURSE:  This is a 60 year old gentleman whose primary care  physician is Dr. Conchita Paris, cardiologist Dr. Juanda Chance.  He recently saw Dr.  Juanda Chance on May 02, 2004 for visit concerning symptoms suggestive of  angina.  He also has features of GERD.  It was arranged for the patient to  have an outpatient stress test.  Stress test completed on May 10, 2004,  results as stated above.  Patient notified of results, arranged for  outpatient cardiac catheterization.  Admitted to [facility] on May 23, 2004 for cardiac catheterization, results as stated above.  The patient  tolerated the procedure without complications.  Being discharged home today  after being seen by Dr. Samule Ohm.      MB/MEDQ  D:  05/26/2004  T:  05/27/2004  Job:  [id number]

## 2010-07-27 NOTE — Cardiovascular Report (Signed)
[facility]  Patient:    Gregory Hansen, Gregory Hansen                         MRN: [id number] Proc. Date: 08/11/00 Adm. Date:  [id number] Attending:  Lewayne Bunting CC:         Conchita Paris, M.D., [facility] W. Ladona Ridgel, M.D. [facility]  Cardiopulmonary Laboratory   Cardiac Catheterization  PROCEDURES PERFORMED:  Cardiac catheterization.  CLINICAL HISTORY:  The patient is 60 years old and has no prior history of known heart disease.  He does have a markedly positive family history for coronary heart disease.  He also has ankylosing spondylitis.  He works in Market [employer] for a [employer] and travels quite a bit in his work.  He lives in [place] but his office is in [place].  He presented to [facility] this weekend with chest pain and ECG changes and acute diaphragmatic wall infarction.  He was brought to the lab but his ST segments resolved and he was not intervened upon and scheduled for a catheterization today.  DESCRIPTION OF PROCEDURE:  The procedure was performed via the right femoral artery using an arterial sheath and 6 French preformed coronary catheters.  A sheath was placed in the right femoral vein for access.  After completion of the diagnostic study and with review of the films with Dr. Riley Kill, we made a decision to proceed with intervention of the right coronary artery.  There is also an ostial lesion of the circumflex artery and a lesion in the mid margin after which we had planned to stage.  We considered him for the PercuSurge EMERALD trial as a roll-in patient, but because of a need for a second vessel intervention within the next several days, we elected not to do this.  The patient was given weight-adjusted heparin to prolong the ACT to greater than 200 seconds and was given double bolus Integrilin in infusion.  We used a JR4, 7 [place] guiding catheter with side  holes and a luge wire.  We crossed the lesion with the wire without difficulty.  We predilated with a 3.5 x 20 mm Quantum Ranger performing two inflations of 8 and 6 atmospheres for 22 seconds each.  We then deployed a 3.5 x 20 mm Radius stent.  We then post-dilated with a 3.5 x 20 mm Quantum Ranger performing one inflation of 12 atmospheres for 31 seconds.  Repeat diagnostic studies were then performed through the guiding catheter.  The patient tolerated the procedure well and left the laboratory in satisfactory condition.  RESULTS:  The left main coronary artery:  The left main coronary artery was free of significant disease.  Left anterior descending:  The left anterior descending artery gave rise to a small septal perforator and moderate sized diagonal branch, two large septal perforators and a second larger diagonal branch.  There were 30-40% stenoses proximally.  There was 70% stenosis in the mid vessel after the second diagonal branch.  The distal vessel was diffusely irregular and diffusely diseased.  Circumflex artery:  The circumflex artery gave rise to an intermediate branch and a marginal branch.  There was 70-80% ostial stenosis in the circumflex artery.  There was 80% eccentric lesion in the marginal branch.  Right coronary artery:  The right coronary is a large dominant vessel that gave rise to a conus branch, right ventricular branch, posterior descending branch,  and two posterolateral branches.  There was a 95% proximal stenosis with a slight filling defect.  There was 50% mid and 50% distal stenosis after the posterior descending branch.  LEFT VENTRICULOGRAPHY:  The left ventriculogram performed in the RAO projection showed slight hypokinesis of the inferobasal wall.  The overall wall motion was quite good with estimated ejection fraction of 55%.  LEFT VENTRICULOGRAM:  The left ventriculogram performed in the LAO projection showed good wall motion with no areas of  hypokinesis.  Following stenting of the proximal LAD lesion the stenosis improved from 95% to less than 10%.  There is no dissection seen.  CONCLUSIONS: 1. Recent acute diaphragmatic wall infarction with spontaneous reperfusion    with 95% stenosis in the proximal right coronary artery, 50% mid and    50% distal stenosis in the right coronary artery, 30-40% stenosis in the    proximal left anterior descending with 70% stenosis in the mid    left anterior descending, 70-80% stenosis in the ostium of the circumflex    artery with 80% stenosis in the marginal branch and minimal inferobasal    wall hypokinesis. 2. Successful stent deployment in the proximal right coronary artery with    improvement in percent diameter narrowing from 95% to less than 10%.  DISPOSITION:  The patient was returned to the postangioplasty unit for further observation.  We will plan to stage the intervention of the circumflex artery for Wednesday. DD:  08/11/00 TD:  08/11/00 Job: [id number]

## 2010-07-27 NOTE — Cardiovascular Report (Signed)
[facility]  Patient:    Gregory Hansen, Gregory Hansen                         MRN: [id number] Proc. Date: 08/13/00 Adm. Date:  [id number] Attending:  Lewayne Bunting                        Cardiac Catheterization  PROCEDURES PERFORMED:  Percutaneous intervention.  CLINICAL HISTORY:  The patient is 60 years old and has no prior history of heart disease and came to [facility] with chest pain.  While he was there his ECG showed ST elevation which then resolved with heparin and aspirin.  He was studied on Monday and had a stent placed to the right coronary artery and is brought back today for intervention on the circumflex artery.  He also had diffuse disease in the distal LAD.  His LAD function showed only very minimal inferior hypokinesis.  DESCRIPTION OF PROCEDURE:  The procedure was performed via the left femoral artery using an arterial sheath and a Voda 3.5 guiding catheter, 7 [place] with side holes.  The patient was given weight-adjusted heparin to prolong the ACT greater than 200 seconds and was given double bolus Integrilin and infusion. We used a luge wire and crossed the lesion in the ostium and circumflex marginal vessel without problems.  We direct stented the lesion in the marginal branch with a 2.5 x 13 mm NIR stent deploying this with one inflation of 12 atmospheres for 35 seconds.  We then used a 2.75 x 10 mm Cutting Balloon in the ostium of the circumflex artery and performed three inflations at 6 atmospheres for 31 seconds.  We then upgraded to a 3.0 x 10 mm Cutting Balloon and performed three inflations of 8, 9, and 10 atmospheres at 37, 33, and 33 seconds at the ostium.  Repeat diagnostic studies were then performed through the guiding catheter.  The patient has ankylosing spondylitis and received fentanyl and Versed during the procedure for pain and sedation, but tolerated the procedure well and left the laboratory in satisfactory  condition.  RESULTS:  Initially the stenosis in the marginal branch was estimated at 90%. Following stenting this improved to 0%.  The ostial stenosis was initially estimated at 80% and following Cutting Balloon angioplasty improved to less than 20%.  CONCLUSIONS: 1. Successful stenting of the marginal branch of the circumflex artery    with improvement in percent diameter narrowing from 90% to 0%. 2. Successful Cutting Balloon angioplasty of the ostial lesion of the    circumflex artery with improvement in percent diameter narrowing from    80% to less than 20%.  DISPOSITION:  The patient was returned to the postangioplasty unit for further observation. DD:  08/13/00 TD:  08/13/00 Job: [id number]

## 2010-07-30 ENCOUNTER — Encounter: Payer: Self-pay | Admitting: Cardiovascular Disease

## 2010-08-07 ENCOUNTER — Encounter: Payer: Self-pay | Admitting: Cardiovascular Disease

## 2010-08-07 ENCOUNTER — Ambulatory Visit (INDEPENDENT_AMBULATORY_CARE_PROVIDER_SITE_OTHER): Payer: BC Managed Care – PPO | Admitting: Cardiovascular Disease

## 2010-08-07 ENCOUNTER — Other Ambulatory Visit: Payer: Self-pay | Admitting: *Deleted

## 2010-08-07 VITALS — BP 138/82 | HR 79 | Resp 18 | Ht 74.0 in | Wt 231.0 lb

## 2010-08-07 DIAGNOSIS — I2510 Atherosclerotic heart disease of native coronary artery without angina pectoris: Secondary | ICD-10-CM

## 2010-08-07 DIAGNOSIS — E785 Hyperlipidemia, unspecified: Secondary | ICD-10-CM

## 2010-08-07 DIAGNOSIS — I10 Essential (primary) hypertension: Secondary | ICD-10-CM

## 2010-08-07 MED ORDER — METOPROLOL SUCCINATE ER 50 MG PO TB24: 50.0000 mg | ORAL_TABLET | Freq: Every day | ORAL | Status: DC

## 2010-08-07 NOTE — Patient Instructions (Signed)
Your physician recommends that you continue on your current medications as directed. Please refer to the Current Medication list given to you today.  Your physician has requested that you have an exercise tolerance test in 1 YEAR. For further information please visit [email or web address] Please also follow instruction sheet, as given.  Your physician recommends that you return for a FASTING lipid and liver profile next Spring.

## 2010-08-24 NOTE — Progress Notes (Signed)
HPI:  This is a 60 year old gentleman presenting for followup evaluation.  The patient has CAD and initially presented with an inferior wall myocardial infarction in 2002. He was treated with a bare-metal stent to the right coronary artery at that time. He has subsequently undergone PCI of the RCA with a drug-eluting stent and PCI of the left circumflex utilizing stenting and cutting balloon angioplasty.  The patient is physically active without exertional symptoms. He specifically denies chest pain, dyspnea, edema, orthopnea, or PND. He reports compliance with his medications.  Outpatient Encounter Prescriptions as of 08/07/2010  Medication Sig Dispense Refill  . aspirin 325 MG EC tablet Take 325 mg by mouth daily.        Marland Kitchen buPROPion (WELLBUTRIN XL) 150 MG 24 hr tablet Take 150 mg by mouth daily.        . clopidogrel (PLAVIX) 75 MG tablet Take 1 tablet (75 mg total) by mouth daily.  30 tablet  9  . multivitamin (THERAGRAN) per tablet Take 1 tablet by mouth daily.        . nefazodone (SERZONE) 100 MG tablet Take 200 mg by mouth every evening.        . niacin (NIASPAN) 500 MG CR tablet Take 500 mg by mouth daily.        . nitroGLYCERIN (NITROSTAT) 0.4 MG SL tablet Place 0.4 mg under the tongue every 5 (five) minutes as needed.        Marland Kitchen omeprazole (PRILOSEC) 20 MG capsule Take 20 mg by mouth daily.        . prednisoLONE acetate (PRED FORTE) 1 % ophthalmic suspension Place 1 drop into both eyes as needed.        . ramipril (ALTACE) 5 MG capsule Take 5 mg by mouth daily.        . rosuvastatin (CRESTOR) 20 MG tablet Take 1 tablet (20 mg total) by mouth at bedtime.  90 tablet  1  . DISCONTD: metoprolol (TOPROL-XL) 50 MG 24 hr tablet Take 50 mg by mouth daily.          No Known Allergies  Past Medical History  Diagnosis Date  . Hyperlipidemia     mixed  . Spondylitis, ankylosing   . GERD (gastroesophageal reflux disease)   . Depression   . Hypertension   . Coronary artery disease     s/p  multiple percutaneous interventions, PCI 202 for DMI and DES distal RCA and CFX-OM 2006    ROS: Negative except as per HPI  BP 138/82  Pulse 79  Resp 18  Ht 6\' 2"  (1.88 m)  Wt 231 lb (104.781 kg)  BMI 29.66 kg/m2  PHYSICAL EXAM: Pt is alert and oriented, NAD HEENT: normal Neck: JVP - normal, carotids 2+= without bruits Lungs: CTA bilaterally CV: RRR without murmur or gallop Abd: soft, NT, Positive BS, no hepatomegaly Ext: no C/C/E, distal pulses intact and equal Skin: warm/dry no rash  EKG:  Normal sinus rhythm 79 beats per minute, within normal limits.  ASSESSMENT AND PLAN:

## 2010-08-24 NOTE — Assessment & Plan Note (Signed)
The patient is stable without angina. His last Myoview scan from April 2011 was negative for ischemia.  I recommended that he remain on his current medical program and return for an exercise treadmill tolerance test in 12 months.

## 2010-08-24 NOTE — Assessment & Plan Note (Signed)
Blood pressure control with ramipril.

## 2010-08-24 NOTE — Assessment & Plan Note (Signed)
Lipids are at goal with an LDL of 70 on Crestor.

## 2010-12-04 ENCOUNTER — Other Ambulatory Visit: Payer: Self-pay | Admitting: *Deleted

## 2010-12-04 MED ORDER — RAMIPRIL 5 MG PO CAPS: 5.0000 mg | ORAL_CAPSULE | Freq: Every day | ORAL | Status: DC

## 2010-12-06 LAB — GRAM STAIN: Gram Stain: NONE SEEN

## 2010-12-06 LAB — BASIC METABOLIC PANEL
Calcium: 9
Chloride: 102
Creatinine, Ser: 0.79
GFR calc Af Amer: >60
GFR calc non Af Amer: >60
Glucose, Bld: 103 — ABNORMAL HIGH
Potassium: 3.8
Sodium: 138
Sodium: 140

## 2010-12-06 LAB — ANAEROBIC CULTURE: Gram Stain: NONE SEEN

## 2010-12-06 LAB — WOUND CULTURE

## 2010-12-06 LAB — SEDIMENTATION RATE: Sed Rate: 10

## 2010-12-06 LAB — C-REACTIVE PROTEIN: CRP: 4 — ABNORMAL HIGH (ref <0.6)

## 2010-12-06 LAB — CBC
RBC: 4.01 — ABNORMAL LOW
WBC: 5.5

## 2011-01-01 ENCOUNTER — Telehealth: Payer: Self-pay | Admitting: Cardiovascular Disease

## 2011-01-01 MED ORDER — NITROGLYCERIN 0.4 MG SL SUBL: 0.4000 mg | SUBLINGUAL_TABLET | SUBLINGUAL | Status: DC | PRN

## 2011-01-01 NOTE — Telephone Encounter (Signed)
Pt wants refill of nitroglycerin thru [facility] he has no pills now please call when done

## 2011-01-01 NOTE — Telephone Encounter (Signed)
Per pt. Request refill sent to [facility] pharmacy

## 2011-01-28 ENCOUNTER — Other Ambulatory Visit: Payer: Self-pay | Admitting: Cardiovascular Disease

## 2011-04-04 ENCOUNTER — Ambulatory Visit (INDEPENDENT_AMBULATORY_CARE_PROVIDER_SITE_OTHER): Payer: BC Managed Care – PPO

## 2011-04-04 DIAGNOSIS — H66009 Acute suppurative otitis media without spontaneous rupture of ear drum, unspecified ear: Secondary | ICD-10-CM

## 2011-04-04 DIAGNOSIS — J019 Acute sinusitis, unspecified: Secondary | ICD-10-CM

## 2011-04-16 ENCOUNTER — Other Ambulatory Visit: Payer: Self-pay | Admitting: Cardiovascular Disease

## 2011-04-16 MED ORDER — NIACIN ER (ANTIHYPERLIPIDEMIC) 500 MG PO TBCR: 500.0000 mg | EXTENDED_RELEASE_TABLET | Freq: Every day | ORAL | Status: DC

## 2011-04-16 NOTE — Telephone Encounter (Signed)
Pt  Wants 90 day supply

## 2011-04-22 ENCOUNTER — Telehealth: Payer: Self-pay | Admitting: Cardiovascular Disease

## 2011-04-22 DIAGNOSIS — I2510 Atherosclerotic heart disease of native coronary artery without angina pectoris: Secondary | ICD-10-CM

## 2011-04-22 NOTE — Telephone Encounter (Signed)
New msg: Pt said there was a change in group # on pt insurance therefore pt needs new RX's called into [facility]  Please call 90 day supply in:  -clopidogrel bisulfate 75 mg -metoprolol XL(toprol) 50 mg

## 2011-04-23 MED ORDER — METOPROLOL SUCCINATE ER 50 MG PO TB24: 50.0000 mg | ORAL_TABLET | Freq: Every day | ORAL | Status: DC

## 2011-04-23 MED ORDER — CLOPIDOGREL BISULFATE 75 MG PO TABS: 75.0000 mg | ORAL_TABLET | Freq: Every day | ORAL | Status: DC

## 2011-04-23 NOTE — Telephone Encounter (Signed)
Called and spoke with pt, sent new scripts to [facility]

## 2011-04-24 ENCOUNTER — Ambulatory Visit (INDEPENDENT_AMBULATORY_CARE_PROVIDER_SITE_OTHER): Payer: BC Managed Care – PPO | Admitting: Physician Assistant

## 2011-04-24 VITALS — BP 130/90 | HR 73 | Temp 99.1°F | Resp 16 | Ht 73.0 in | Wt 238.0 lb

## 2011-04-24 DIAGNOSIS — H6500 Acute serous otitis media, unspecified ear: Secondary | ICD-10-CM

## 2011-04-24 DIAGNOSIS — J019 Acute sinusitis, unspecified: Secondary | ICD-10-CM

## 2011-04-24 MED ORDER — IPRATROPIUM BROMIDE 0.06 % NA SOLN: 2.0000 | Freq: Two times a day (BID) | NASAL | Status: DC

## 2011-04-24 MED ORDER — AZITHROMYCIN 500 MG PO TABS: 500.0000 mg | ORAL_TABLET | Freq: Every day | ORAL | Status: AC

## 2011-04-24 NOTE — Progress Notes (Signed)
Patient ID: Gregory Hansen MRN: [id number], DOB: October 24, 1950, 61 y.o. Date of Encounter: 04/24/2011, 3:00 PM  Primary Physician: No primary provider on file.  Chief Complaint:  Chief Complaint  Patient presents with  . Sinusitis    chills. hot flashes    HPI: 61 y.o. year old male presents with 6-7 day history of return of nasal congestion, sinus pressure, bilateral ear fullness, and post nasal drip. Was initially seen for the above on 04/04/11 and treat for sinusitis with Amoxicillin 875 mg 1 po bid. Symptoms improved, however they did return with the above. Has previously had episodes of sinusitis that did not resolve with one treatment course of antibiotic. Subjective fever and chills. Typical for patient when he has sinusitis. Cough is nonproductive and associated with post nasal drip. Worse when he lays down at night. Nasal congestion thick and green. Hearing is muffled.  Sinus pressure along maxillary sinus Appetite ok. Leaving here and going to [facility]. "I want some chicken." Medications: Zyrtec and Amoxicillin in Jan. No GI symptoms Positive sick contacts: no No Recent antibiotics  No leg trauma, sedentary periods, h/o cancer, or tobacco use.  Past Medical History  Diagnosis Date  . Hyperlipidemia     mixed  . Spondylitis, ankylosing   . GERD (gastroesophageal reflux disease)   . Depression   . Hypertension   . Coronary artery disease     s/p multiple percutaneous interventions, PCI 202 for DMI and DES distal RCA and CFX-OM 2006     Home Meds: Prior to Admission medications   Medication Sig Start Date End Date Taking? Authorizing Provider  aspirin 325 MG EC tablet Take 325 mg by mouth daily.     Yes Historical Provider, MD  buPROPion (WELLBUTRIN XL) 150 MG 24 hr tablet Take 150 mg by mouth daily.     Yes Historical Provider, MD  clopidogrel (PLAVIX) 75 MG tablet Take 1 tablet (75 mg total) by mouth daily. 04/23/11 04/22/12 Yes Micheline Chapman, MD  CRESTOR 20  MG tablet TAKE 1 TABLET AT BEDTIME 01/28/11  Yes Micheline Chapman, MD  metoprolol succinate (TOPROL-XL) 50 MG 24 hr tablet Take 1 tablet (50 mg total) by mouth daily. 04/23/11  Yes Micheline Chapman, MD  multivitamin [facility]) per tablet Take 1 tablet by mouth daily.     Yes Historical Provider, MD  nefazodone (SERZONE) 100 MG tablet Take 200 mg by mouth every evening.     Yes Historical Provider, MD  niacin (NIASPAN) 500 MG CR tablet Take 1 tablet (500 mg total) by mouth daily. 04/16/11  Yes Micheline Chapman, MD  nitroGLYCERIN (NITROSTAT) 0.4 MG SL tablet Place 1 tablet (0.4 mg total) under the tongue every 5 (five) minutes as needed. 01/01/11  Yes Micheline Chapman, MD  omeprazole (PRILOSEC) 20 MG capsule Take 20 mg by mouth daily.     Yes Historical Provider, MD  prednisoLONE acetate (PRED FORTE) 1 % ophthalmic suspension Place 1 drop into both eyes as needed.     Yes Historical Provider, MD  ramipril (ALTACE) 5 MG capsule Take 1 capsule (5 mg total) by mouth daily. 12/04/10  Yes Micheline Chapman, MD    Allergies: No Known Allergies  History   Social History  . Marital Status: Single    Spouse Name: N/A    Number of Children: N/A  . Years of Education: N/A   Occupational History  . Not on file.   Social History Main Topics  .  Smoking status: Never Smoker   . Smokeless tobacco: Not on file  . Alcohol Use: Yes     glass of wine at dinner everyday.  . Drug Use: No  . Sexually Active: Not on file   Other Topics Concern  . Not on file   Social History Narrative  . No narrative on file     Review of Systems: Constitutional: negative for night sweats or weight changes Cardiovascular: negative for chest pain or palpitations Respiratory: negative for hemoptysis, wheezing, or shortness of breath Abdominal: negative for abdominal pain, nausea, vomiting or diarrhea Dermatological: negative for rash Neurologic: negative for headache   Physical Exam: Blood pressure 130/90, pulse 73,  temperature 99.1 F (37.3 C), temperature source Oral, resp. rate 16, height 6\' 1"  (1.854 m), weight 238 lb (107.956 kg)., Body mass index is 31.40 kg/(m^2). General: Well developed, well nourished, in no acute distress. Head: Normocephalic, atraumatic, eyes without discharge, sclera non-icteric, nares are congested. Bilateral auditory canals clear, TM's are without perforation, pearly grey with reflective cone of light bilaterally. Serous effusion bilaterally. Oral cavity moist, dentition with dental work. Posterior pharynx with post nasal drip and mild erythema. No peritonsillar abscess or tonsillar exudate. TTP over maxillary sinus bilaterally. Neck: Supple. No thyromegaly. Full ROM. No lymphadenopathy. Lungs: Clear bilaterally to auscultation without wheezes, rales, or rhonchi. Breathing is unlabored. Heart: RRR with S1 S2. No murmurs, rubs, or gallops appreciated. Msk:  Strength and tone normal for age. Extremities: No clubbing or cyanosis. No edema. Neuro: Alert and oriented X 3. Moves all extremities spontaneously. CNII-XII grossly in tact. Psych:  Responds to questions appropriately with a normal affect.     ASSESSMENT AND PLAN:  61 y.o. year old male with sinusitis. -Azithromycin 500 mg #5 1 po daily no RF -Atrovent NS 0.06% 2 sprays each nare bid prn #1 no RF -Mucinex -Tylenol/Motrin prn -Rest/fluids -RTC precautions -RTC 3-5 days if no improvement  Signed, Eula Listen, PA-C 04/24/2011 3:00 PM

## 2011-05-17 ENCOUNTER — Other Ambulatory Visit: Payer: Self-pay | Admitting: Cardiovascular Disease

## 2011-05-22 ENCOUNTER — Other Ambulatory Visit: Payer: Self-pay

## 2011-05-22 DIAGNOSIS — E785 Hyperlipidemia, unspecified: Secondary | ICD-10-CM

## 2011-05-22 DIAGNOSIS — I2510 Atherosclerotic heart disease of native coronary artery without angina pectoris: Secondary | ICD-10-CM

## 2011-05-22 DIAGNOSIS — I10 Essential (primary) hypertension: Secondary | ICD-10-CM

## 2011-08-01 ENCOUNTER — Other Ambulatory Visit (INDEPENDENT_AMBULATORY_CARE_PROVIDER_SITE_OTHER): Payer: BC Managed Care – PPO

## 2011-08-01 ENCOUNTER — Other Ambulatory Visit: Payer: Self-pay | Admitting: Cardiovascular Disease

## 2011-08-01 DIAGNOSIS — I2510 Atherosclerotic heart disease of native coronary artery without angina pectoris: Secondary | ICD-10-CM

## 2011-08-01 DIAGNOSIS — E785 Hyperlipidemia, unspecified: Secondary | ICD-10-CM

## 2011-08-01 DIAGNOSIS — I10 Essential (primary) hypertension: Secondary | ICD-10-CM

## 2011-08-01 LAB — HEPATIC FUNCTION PANEL
Bilirubin, Direct: 0.1 mg/dL (ref 0.0–0.3)
Total Bilirubin: 0.6 mg/dL (ref 0.3–1.2)

## 2011-08-01 LAB — LIPID PANEL
HDL: 47.6 mg/dL (ref >39.00)
Total CHOL/HDL Ratio: 3
Triglycerides: 223 mg/dL — ABNORMAL HIGH (ref 0.0–149.0)
VLDL: 44.6 mg/dL — ABNORMAL HIGH (ref 0.0–40.0)

## 2011-08-07 ENCOUNTER — Encounter: Payer: Self-pay | Admitting: Cardiovascular Disease

## 2011-08-07 ENCOUNTER — Ambulatory Visit (INDEPENDENT_AMBULATORY_CARE_PROVIDER_SITE_OTHER): Payer: BC Managed Care – PPO | Admitting: Cardiovascular Disease

## 2011-08-07 ENCOUNTER — Other Ambulatory Visit: Payer: Self-pay | Admitting: *Deleted

## 2011-08-07 DIAGNOSIS — E785 Hyperlipidemia, unspecified: Secondary | ICD-10-CM

## 2011-08-07 DIAGNOSIS — I10 Essential (primary) hypertension: Secondary | ICD-10-CM

## 2011-08-07 DIAGNOSIS — I2510 Atherosclerotic heart disease of native coronary artery without angina pectoris: Secondary | ICD-10-CM

## 2011-08-07 NOTE — Procedures (Signed)
Exercise Treadmill Test  Pre-Exercise Testing Evaluation Rhythm: normal sinus  Rate: 81   PR:  .14 QRS:  .09  QT:  .37 QTc: .43     Test  Exercise Tolerance Test Ordering MD: [name], MD  Interpreting MD: [name], MD  Unique Test No: 1  Treadmill:  1  Indication for ETT: known ASHD  Contraindication to ETT: No   Stress Modality: exercise - treadmill  Cardiac Imaging Performed: non   Protocol: standard Bruce - maximal  Max BP:  153/70  Max MPHR (bpm):  159 85% MPR (bpm):  135  MPHR obtained (bpm):  130 % MPHR obtained:  82%  Reached 85% MPHR (min:sec):  N/A Total Exercise Time (min-sec):  8:18  Workload in METS:  10.4 Borg Scale: 19  Reason ETT Terminated:  dyspnea    ST Segment Analysis At Rest: normal ST segments - no evidence of significant ST depression With Exercise: no evidence of significant ST depression  Other Information Arrhythmia:  No Angina during ETT:  absent (0) Quality of ETT:  diagnostic  ETT Interpretation:  normal - no evidence of ischemia by ST analysis  Comments: 1. Fair exercise tolerance 2. No ST changes, angina, or arrhythmia with exertion 3. Did not achieve target heart rate (pt took beta-blocker today)  Recommendations: Graded exercise program. Continue same medical therapy. Repeat lipids and lft's in 6 months with a FOV after the labwork.

## 2011-08-08 ENCOUNTER — Telehealth: Payer: Self-pay | Admitting: *Deleted

## 2011-08-08 NOTE — Telephone Encounter (Signed)
Pt notified lipids are normal

## 2011-08-12 ENCOUNTER — Other Ambulatory Visit: Payer: Self-pay | Admitting: Cardiovascular Disease

## 2011-08-14 NOTE — Telephone Encounter (Signed)
Pt needs appointment then refill can be made Fax Received. Refill Completed. Alliah Boulanger Chowoe (R.M.A)   

## 2011-09-30 ENCOUNTER — Encounter: Payer: Self-pay | Admitting: Cardiovascular Disease

## 2011-09-30 ENCOUNTER — Ambulatory Visit (INDEPENDENT_AMBULATORY_CARE_PROVIDER_SITE_OTHER): Payer: BC Managed Care – PPO | Admitting: Cardiovascular Disease

## 2011-09-30 ENCOUNTER — Encounter: Payer: Self-pay | Admitting: *Deleted

## 2011-09-30 ENCOUNTER — Telehealth: Payer: Self-pay | Admitting: Cardiovascular Disease

## 2011-09-30 ENCOUNTER — Other Ambulatory Visit: Payer: Self-pay | Admitting: Cardiovascular Disease

## 2011-09-30 VITALS — BP 140/90 | HR 81 | Ht 74.0 in | Wt 241.0 lb

## 2011-09-30 DIAGNOSIS — E785 Hyperlipidemia, unspecified: Secondary | ICD-10-CM

## 2011-09-30 DIAGNOSIS — R079 Chest pain, unspecified: Secondary | ICD-10-CM

## 2011-09-30 DIAGNOSIS — I10 Essential (primary) hypertension: Secondary | ICD-10-CM

## 2011-09-30 DIAGNOSIS — I208 Other forms of angina pectoris: Secondary | ICD-10-CM | POA: Insufficient documentation

## 2011-09-30 DIAGNOSIS — I209 Angina pectoris, unspecified: Secondary | ICD-10-CM

## 2011-09-30 DIAGNOSIS — I2089 Other forms of angina pectoris: Secondary | ICD-10-CM | POA: Insufficient documentation

## 2011-09-30 LAB — BASIC METABOLIC PANEL
CO2: 28 mEq/L (ref 19–32)
Calcium: 9.1 mg/dL (ref 8.4–10.5)
Creat: 0.8 mg/dL (ref 0.50–1.35)
Sodium: 138 mEq/L (ref 135–145)

## 2011-09-30 LAB — CBC WITH DIFFERENTIAL/PLATELET
Basophils Relative: 0 % (ref 0–1)
Eosinophils Absolute: 0.7 10*3/uL (ref 0.0–0.7)
Eosinophils Relative: 9 % — ABNORMAL HIGH (ref 0–5)
MCH: 32.6 pg (ref 26.0–34.0)
MCHC: 34.2 g/dL (ref 30.0–36.0)
MCV: 95.1 fL (ref 78.0–100.0)
Neutrophils Relative %: 62 % (ref 43–77)
Platelets: 171 10*3/uL (ref 150–400)
RDW: 12 % (ref 11.5–15.5)

## 2011-09-30 LAB — PROTIME-INR: Prothrombin Time: 13.4 seconds (ref 11.6–15.2)

## 2011-09-30 NOTE — Patient Instructions (Signed)
Lab work today for pre cath.

## 2011-09-30 NOTE — Assessment & Plan Note (Signed)
The patient has recent onset exertional angina with a background of known coronary artery disease. His symptoms are very compelling as they are classic for angina and they are similar to his previous episodes. He is on a good medical program which includes dual antiplatelet therapy. He started on a beta blocker and ACE inhibitor as well as a statin drug. I reviewed the risks, indications, and alternatives to cardiac catheterization and possible PCI. The patient understands and agrees to proceed. He will be scheduled in the inpatient cardiac cath lab tomorrow as there is a high likelihood that he will require coronary intervention. I explained to him that if he develops resting chest pain unresponsive to a single nitroglycerin I would like him to call [phone] immediately.

## 2011-09-30 NOTE — Progress Notes (Signed)
HPI:  61 year old gentleman presenting for followup evaluation. The patient has coronary artery disease and initially presented in 2002 with an acute inferior wall myocardial infarction. He was treated with bare-metal stenting of the right coronary artery and then later underwent drug-eluting stent implantation in the RCA and the left circumflex. The patient just had an exercise treadmill study 08/07/2011. At that time he demonstrated fair exercise tolerance and had no ST segment changes or anginal symptoms with exertion. He did not achieve target heart rate as his beta blocker was on interrupted.  Unfortunately, the patient has developed exertional tightness in his epigastrium and lower chest. This is happened over the past one to 2 weeks. His symptoms are nonradiating. This feels like a pressure or tightness. He rates it at a 5/10 on the pain intensity scale. It is similar to his previous anginal symptoms and feels like his prior heart attack. His symptoms resolve within a few minutes of rest. Symptoms have occurred with mowing his lawn over the past few weeks. They have progressed and today he had exertional discomfort with walking into the office. He has had no resting pain. He has not required sublingual nitroglycerin. He does complain of associated shortness of breath. He denies diaphoresis, nausea, or vomiting. He has remained on his home medical program and denies any recent medicine changes.  Outpatient Encounter Prescriptions as of 09/30/2011  Medication Sig Dispense Refill  . aspirin 325 MG EC tablet Take 325 mg by mouth daily.        Marland Kitchen buPROPion (WELLBUTRIN XL) 150 MG 24 hr tablet Take 150 mg by mouth daily.        . clopidogrel (PLAVIX) 75 MG tablet TAKE 1 TABLET DAILY  90 tablet  2  . CRESTOR 20 MG tablet TAKE 1 TABLET AT BEDTIME  90 tablet  6  . ipratropium (ATROVENT) 0.06 % nasal spray Place 2 sprays into the nose 2 (two) times daily.  15 mL  0  . metoprolol succinate (TOPROL-XL) 50 MG  24 hr tablet TAKE 1 TABLET DAILY  90 tablet  0  . multivitamin (THERAGRAN) per tablet Take 1 tablet by mouth daily.        . nefazodone (SERZONE) 100 MG tablet Take 200 mg by mouth every morning.       . niacin (NIASPAN) 500 MG CR tablet Take 1 tablet (500 mg total) by mouth daily.  90 tablet  1  . nitroGLYCERIN (NITROSTAT) 0.4 MG SL tablet Place 1 tablet (0.4 mg total) under the tongue every 5 (five) minutes as needed.  25 tablet  2  . omeprazole (PRILOSEC) 20 MG capsule Take 20 mg by mouth daily.        . prednisoLONE acetate (PRED FORTE) 1 % ophthalmic suspension Place 1 drop into both eyes as needed.        . ramipril (ALTACE) 5 MG capsule Take 1 capsule (5 mg total) by mouth daily.  90 capsule  3    No Known Allergies  Past Medical History  Diagnosis Date  . Hyperlipidemia     mixed  . Spondylitis, ankylosing   . GERD (gastroesophageal reflux disease)   . Depression   . Hypertension   . Coronary artery disease     s/p multiple percutaneous interventions, PCI 202 for DMI and DES distal RCA and CFX-OM 2006    ROS: Negative except as per HPI  BP 140/90  Pulse 81  Ht 6\' 2"  (1.88 m)  Wt 109.317 kg (241  lb)  BMI 30.94 kg/m2  PHYSICAL EXAM: Pt is alert and oriented, pleasant overweight male in NAD HEENT: normal Neck: JVP - normal, carotids 2+= without bruits Lungs: CTA bilaterally CV: RRR without murmur or gallop Abd: soft, NT, Positive BS, no hepatomegaly Ext: no C/C/E, distal pulses intact and equal Skin: warm/dry no rash  EKG:  Normal sinus rhythm 82 beats per minute, left axis deviation, otherwise within limits there  ASSESSMENT AND PLAN:

## 2011-09-30 NOTE — Telephone Encounter (Signed)
I spoke with the pt and he complains of SOB and a tightness in his lower chest with exertion.  The pt push mowed his yard a week ago and after making two laps he developed symptoms.  The pt sat down to rest and his symptoms resolved.  The pt did another two laps and developed symptoms again and these resolved with rest.  Today the pt walked to his trash can outside the home and he developed the same symptoms.  The pt is concerned about symptoms and would like to be evaluated.  I have scheduled the pt to come into the office today to see Dr Excell Seltzer.

## 2011-09-30 NOTE — Assessment & Plan Note (Signed)
Blood pressure is borderline today. The patient is stressed about his symptoms. We'll reevaluate tomorrow in the cardiac catheterization lab.

## 2011-09-30 NOTE — Telephone Encounter (Signed)
Please return call to patient at [phone]  Patient c/o SOB and tightness in chest/stomach upon exhertion area over the last week.

## 2011-09-30 NOTE — Assessment & Plan Note (Signed)
Lipids have been at goal, but his transaminases have been modestly elevated. He remains on Crestor and we will continue to follow him at regular intervals.

## 2011-10-01 ENCOUNTER — Ambulatory Visit (HOSPITAL_COMMUNITY)
Admission: RE | Admit: 2011-10-01 | Discharge: 2011-10-02 | Disposition: A | Discharge status: Home / Self Care | Payer: BC Managed Care – PPO | Source: Ambulatory Visit | Attending: Cardiovascular Disease | Admitting: Cardiovascular Disease

## 2011-10-01 ENCOUNTER — Encounter (HOSPITAL_COMMUNITY)
Admission: RE | Disposition: A | Discharge status: Home / Self Care | Payer: Self-pay | Source: Ambulatory Visit | Attending: Cardiovascular Disease

## 2011-10-01 ENCOUNTER — Encounter: Payer: Self-pay | Admitting: Cardiovascular Disease

## 2011-10-01 DIAGNOSIS — M459 Ankylosing spondylitis of unspecified sites in spine: Secondary | ICD-10-CM | POA: Insufficient documentation

## 2011-10-01 DIAGNOSIS — I2510 Atherosclerotic heart disease of native coronary artery without angina pectoris: Principal | ICD-10-CM | POA: Insufficient documentation

## 2011-10-01 DIAGNOSIS — I10 Essential (primary) hypertension: Secondary | ICD-10-CM | POA: Insufficient documentation

## 2011-10-01 DIAGNOSIS — E785 Hyperlipidemia, unspecified: Secondary | ICD-10-CM | POA: Insufficient documentation

## 2011-10-01 DIAGNOSIS — I252 Old myocardial infarction: Secondary | ICD-10-CM | POA: Insufficient documentation

## 2011-10-01 DIAGNOSIS — I200 Unstable angina: Secondary | ICD-10-CM | POA: Insufficient documentation

## 2011-10-01 HISTORY — PX: LEFT HEART CATHETERIZATION WITH CORONARY ANGIOGRAM: SHX5451

## 2011-10-01 LAB — POCT ACTIVATED CLOTTING TIME: Activated Clotting Time: 564 seconds

## 2011-10-01 LAB — CARDIAC PANEL(CRET KIN+CKTOT+MB+TROPI): Total CK: 45 U/L (ref 7–232)

## 2011-10-01 SURGERY — LEFT HEART CATHETERIZATION WITH CORONARY ANGIOGRAM
Anesthesia: LOCAL

## 2011-10-01 MED ORDER — HYDROMORPHONE HCL PF 2 MG/ML IJ SOLN
INTRAMUSCULAR | Status: AC
  Filled 2011-10-01: qty 1

## 2011-10-01 MED ORDER — MULTIVITAMINS PO TABS: 1.0000 | ORAL_TABLET | Freq: Every day | ORAL | Status: DC

## 2011-10-01 MED ORDER — MORPHINE SULFATE 10 MG/ML IJ SOLN: 2.0000 mg | INTRAMUSCULAR | Status: DC | PRN

## 2011-10-01 MED ORDER — NITROGLYCERIN 0.4 MG SL SUBL: 0.4000 mg | SUBLINGUAL_TABLET | SUBLINGUAL | Status: DC | PRN

## 2011-10-01 MED ORDER — ASPIRIN 81 MG PO CHEW: 324.0000 mg | CHEWABLE_TABLET | ORAL | Status: DC

## 2011-10-01 MED ORDER — PANTOPRAZOLE SODIUM 40 MG PO TBEC: 40.0000 mg | DELAYED_RELEASE_TABLET | Freq: Every day | ORAL | Status: DC

## 2011-10-01 MED ORDER — SODIUM CHLORIDE 0.9 % IJ SOLN: 3.0000 mL | INTRAMUSCULAR | Status: DC | PRN

## 2011-10-01 MED ORDER — SODIUM CHLORIDE 0.9 % IJ SOLN: 3.0000 mL | Freq: Two times a day (BID) | INTRAMUSCULAR | Status: DC

## 2011-10-01 MED ORDER — DIAZEPAM 5 MG PO TABS
ORAL_TABLET | ORAL | Status: AC
  Filled 2011-10-01: qty 1

## 2011-10-01 MED ORDER — FENTANYL CITRATE 0.05 MG/ML IJ SOLN
INTRAMUSCULAR | Status: AC
  Filled 2011-10-01: qty 2

## 2011-10-01 MED ORDER — RAMIPRIL 5 MG PO CAPS
5.0000 mg | ORAL_CAPSULE | Freq: Every day | ORAL | Status: DC
  Filled 2011-10-01: qty 1

## 2011-10-01 MED ORDER — MIDAZOLAM HCL 2 MG/2ML IJ SOLN
INTRAMUSCULAR | Status: AC
  Filled 2011-10-01: qty 2

## 2011-10-01 MED ORDER — ASPIRIN 300 MG RE SUPP: 300.0000 mg | RECTAL | Status: DC

## 2011-10-01 MED ORDER — SODIUM CHLORIDE 0.9 % IV SOLN: 250.0000 mL | INTRAVENOUS | Status: DC | PRN

## 2011-10-01 MED ORDER — SODIUM CHLORIDE 0.9 % IV SOLN: 1.0000 mL/kg/h | INTRAVENOUS | Status: AC

## 2011-10-01 MED ORDER — SODIUM CHLORIDE 0.9 % IV SOLN: 250.0000 mL | INTRAVENOUS | Status: DC

## 2011-10-01 MED ORDER — BIVALIRUDIN 250 MG IV SOLR
INTRAVENOUS | Status: AC
  Filled 2011-10-01: qty 250

## 2011-10-01 MED ORDER — NITROGLYCERIN IN D5W 200-5 MCG/ML-% IV SOLN: 2.0000 ug/min | INTRAVENOUS | Status: DC

## 2011-10-01 MED ORDER — ADULT MULTIVITAMIN W/MINERALS CH
1.0000 | ORAL_TABLET | Freq: Every day | ORAL | Status: DC
  Filled 2011-10-01: qty 1

## 2011-10-01 MED ORDER — ACETAMINOPHEN 325 MG PO TABS: 650.0000 mg | ORAL_TABLET | ORAL | Status: DC | PRN

## 2011-10-01 MED ORDER — NIACIN ER (ANTIHYPERLIPIDEMIC) 500 MG PO TBCR
500.0000 mg | EXTENDED_RELEASE_TABLET | Freq: Every day | ORAL | Status: DC
  Filled 2011-10-01: qty 1

## 2011-10-01 MED ORDER — ATORVASTATIN CALCIUM 40 MG PO TABS
40.0000 mg | ORAL_TABLET | Freq: Every day | ORAL | Status: DC
  Administered 2011-10-01: 22:00:00 40 mg via ORAL
  Filled 2011-10-01 (×3): qty 1

## 2011-10-01 MED ORDER — IPRATROPIUM BROMIDE 0.06 % NA SOLN
2.0000 | Freq: Two times a day (BID) | NASAL | Status: DC
  Filled 2011-10-01 (×2): qty 15

## 2011-10-01 MED ORDER — ASPIRIN 81 MG PO CHEW
324.0000 mg | CHEWABLE_TABLET | ORAL | Status: AC
  Administered 2011-10-01: 324 mg via ORAL

## 2011-10-01 MED ORDER — ONDANSETRON HCL 4 MG/2ML IJ SOLN: 4.0000 mg | Freq: Four times a day (QID) | INTRAMUSCULAR | Status: DC | PRN

## 2011-10-01 MED ORDER — HEPARIN (PORCINE) IN NACL 2-0.9 UNIT/ML-% IJ SOLN
INTRAMUSCULAR | Status: AC
  Filled 2011-10-01: qty 2000

## 2011-10-01 MED ORDER — NEFAZODONE HCL 200 MG PO TABS
200.0000 mg | ORAL_TABLET | Freq: Every morning | ORAL | Status: DC
  Filled 2011-10-01: qty 1

## 2011-10-01 MED ORDER — ASPIRIN 81 MG PO CHEW
CHEWABLE_TABLET | ORAL | Status: AC
  Filled 2011-10-01: qty 4

## 2011-10-01 MED ORDER — HEPARIN SODIUM (PORCINE) 1000 UNIT/ML IJ SOLN
INTRAMUSCULAR | Status: AC
  Filled 2011-10-01: qty 1

## 2011-10-01 MED ORDER — SODIUM CHLORIDE 0.9 % IJ SOLN
3.0000 mL | Freq: Two times a day (BID) | INTRAMUSCULAR | Status: DC
  Administered 2011-10-02: 3 mL via INTRAVENOUS

## 2011-10-01 MED ORDER — SODIUM CHLORIDE 0.9 % IV SOLN: INTRAVENOUS | Status: DC

## 2011-10-01 MED ORDER — METOPROLOL SUCCINATE ER 50 MG PO TB24
50.0000 mg | ORAL_TABLET | Freq: Every day | ORAL | Status: DC
  Filled 2011-10-01: qty 1

## 2011-10-01 MED ORDER — OXYCODONE-ACETAMINOPHEN 5-325 MG PO TABS
1.0000 | ORAL_TABLET | ORAL | Status: DC | PRN
  Administered 2011-10-02: 1 via ORAL
  Filled 2011-10-01: qty 1

## 2011-10-01 MED ORDER — CLOPIDOGREL BISULFATE 75 MG PO TABS
75.0000 mg | ORAL_TABLET | Freq: Every day | ORAL | Status: DC
  Administered 2011-10-02: 08:00:00 75 mg via ORAL
  Filled 2011-10-01: qty 1

## 2011-10-01 MED ORDER — ASPIRIN EC 81 MG PO TBEC
81.0000 mg | DELAYED_RELEASE_TABLET | Freq: Every day | ORAL | Status: DC
  Filled 2011-10-01: qty 1

## 2011-10-01 MED ORDER — BUPROPION HCL ER (XL) 150 MG PO TB24
150.0000 mg | ORAL_TABLET | Freq: Every day | ORAL | Status: DC
  Filled 2011-10-01: qty 1

## 2011-10-01 MED ORDER — VERAPAMIL HCL 2.5 MG/ML IV SOLN
INTRAVENOUS | Status: AC
  Filled 2011-10-01: qty 2

## 2011-10-01 MED ORDER — NITROGLYCERIN 0.2 MG/ML ON CALL CATH LAB
INTRAVENOUS | Status: AC
  Filled 2011-10-01: qty 1

## 2011-10-01 MED ORDER — CLOPIDOGREL BISULFATE 75 MG PO TABS: 75.0000 mg | ORAL_TABLET | ORAL | Status: DC

## 2011-10-01 MED ORDER — MORPHINE SULFATE 2 MG/ML IJ SOLN: 2.0000 mg | INTRAMUSCULAR | Status: DC | PRN

## 2011-10-01 MED ORDER — LIDOCAINE HCL (PF) 1 % IJ SOLN
INTRAMUSCULAR | Status: AC
  Filled 2011-10-01: qty 30

## 2011-10-01 MED ORDER — DIAZEPAM 5 MG PO TABS
5.0000 mg | ORAL_TABLET | ORAL | Status: DC
  Administered 2011-10-01: 5 mg via ORAL

## 2011-10-01 NOTE — H&P (View-Only) (Signed)
 HPI:  61-year-old gentleman presenting for followup evaluation. The patient has coronary artery disease and initially presented in 2002 with an acute inferior wall myocardial infarction. He was treated with bare-metal stenting of the right coronary artery and then later underwent drug-eluting stent implantation in the RCA and the left circumflex. The patient just had an exercise treadmill study 08/07/2011. At that time he demonstrated fair exercise tolerance and had no ST segment changes or anginal symptoms with exertion. He did not achieve target heart rate as his beta blocker was on interrupted.  Unfortunately, the patient has developed exertional tightness in his epigastrium and lower chest. This is happened over the past one to 2 weeks. His symptoms are nonradiating. This feels like a pressure or tightness. He rates it at a 5/10 on the pain intensity scale. It is similar to his previous anginal symptoms and feels like his prior heart attack. His symptoms resolve within a few minutes of rest. Symptoms have occurred with mowing his lawn over the past few weeks. They have progressed and today he had exertional discomfort with walking into the office. He has had no resting pain. He has not required sublingual nitroglycerin. He does complain of associated shortness of breath. He denies diaphoresis, nausea, or vomiting. He has remained on his home medical program and denies any recent medicine changes.  Outpatient Encounter Prescriptions as of 09/30/2011  Medication Sig Dispense Refill  . aspirin 325 MG EC tablet Take 325 mg by mouth daily.        . buPROPion (WELLBUTRIN XL) 150 MG 24 hr tablet Take 150 mg by mouth daily.        . clopidogrel (PLAVIX) 75 MG tablet TAKE 1 TABLET DAILY  90 tablet  2  . CRESTOR 20 MG tablet TAKE 1 TABLET AT BEDTIME  90 tablet  6  . ipratropium (ATROVENT) 0.06 % nasal spray Place 2 sprays into the nose 2 (two) times daily.  15 mL  0  . metoprolol succinate (TOPROL-XL) 50 MG  24 hr tablet TAKE 1 TABLET DAILY  90 tablet  0  . multivitamin (THERAGRAN) per tablet Take 1 tablet by mouth daily.        . nefazodone (SERZONE) 100 MG tablet Take 200 mg by mouth every morning.       . niacin (NIASPAN) 500 MG CR tablet Take 1 tablet (500 mg total) by mouth daily.  90 tablet  1  . nitroGLYCERIN (NITROSTAT) 0.4 MG SL tablet Place 1 tablet (0.4 mg total) under the tongue every 5 (five) minutes as needed.  25 tablet  2  . omeprazole (PRILOSEC) 20 MG capsule Take 20 mg by mouth daily.        . prednisoLONE acetate (PRED FORTE) 1 % ophthalmic suspension Place 1 drop into both eyes as needed.        . ramipril (ALTACE) 5 MG capsule Take 1 capsule (5 mg total) by mouth daily.  90 capsule  3    No Known Allergies  Past Medical History  Diagnosis Date  . Hyperlipidemia     mixed  . Spondylitis, ankylosing   . GERD (gastroesophageal reflux disease)   . Depression   . Hypertension   . Coronary artery disease     s/p multiple percutaneous interventions, PCI 202 for DMI and DES distal RCA and CFX-OM 2006    ROS: Negative except as per HPI  BP 140/90  Pulse 81  Ht 6' 2" (1.88 m)  Wt 109.317 kg (241   lb)  BMI 30.94 kg/m2  PHYSICAL EXAM: Pt is alert and oriented, pleasant overweight male in NAD HEENT: normal Neck: JVP - normal, carotids 2+= without bruits Lungs: CTA bilaterally CV: RRR without murmur or gallop Abd: soft, NT, Positive BS, no hepatomegaly Ext: no C/C/E, distal pulses intact and equal Skin: warm/dry no rash  EKG:  Normal sinus rhythm 82 beats per minute, left axis deviation, otherwise within limits there  ASSESSMENT AND PLAN:     

## 2011-10-01 NOTE — CV Procedure (Signed)
Cardiac Catheterization Procedure Note  Name: Gregory Hansen MRN: [id number] DOB: 01/11/1951  Procedure: Left Heart Cath, Selective Coronary Angiography, LV angiography, PTCA and stenting of the right coronary artery  Indication: 61 year old gentleman with known CAD who last underwent cardiac catheterization and PCI 2006. He was seen in the office yesterday because of progressive angina with low-level exertion. He presented today for cardiac catheterization and possible PCI. While he was in waiting area he developed resting chest pain and was admitted to the hospital with unstable angina. He has been maintained on aspirin and Plavix and now presents emergently for cardiac catheterization.  Procedural Details:  The right wrist was prepped, draped, and anesthetized with 1% lidocaine. Using the modified Seldinger technique, a 5 French sheath was introduced into the right radial artery. 3 mg of verapamil was administered through the sheath, weight-based unfractionated heparin was administered intravenously. Standard Judkins catheters were used for selective coronary angiography and left ventriculography. Catheter exchanges were performed over an exchange length guidewire.  PROCEDURAL FINDINGS Hemodynamics: AO 145/88 with a mean of 114 LV 142/14   Coronary angiography: Coronary dominance: right  Left mainstem: The left main is a short segment with 30% stenosis.  Left anterior descending (LAD): The LAD is calcified. The proximal vessel at the level of the first diagonal has diffuse 50% stenosis. This is unchanged from the previous study. There is segmental disease at the junction of the mid and distal LAD with 70% stenosis. The LAD is small through this region and is not suitable for PCI. The first and second diagonal branches are both patent.  Left circumflex (LCx): The left circumflex has 30% ostial stenosis. The stented segment in the mid vessel is widely patent. The vessel gives off 2 obtuse  marginal branches without significant stenosis.  Right coronary artery (RCA): The RCA is severely diseased. The vessel has nonobstructive stenosis proximally. The mid vessel has critical stenosis with tandem 99% lesions and filling defects suggestive of acute thrombus. The distal vessel is patent the PDA fills late from antegrade flow and also fills from left to right collaterals. The posterolateral branch fills both antegrade and from left to right collaterals.   Left ventriculography: Left ventricular systolic function is normal, LVEF is estimated at 55-65%, there is no significant mitral regurgitation   PCI Note:  Following the diagnostic procedure, the decision was made to proceed with PCI. The radial sheath was upsized to a 6 [place]. Weight-based bivalirudin was given for anticoagulation. The patient has been on long-term aspirin and Plavix. Once a therapeutic ACT was achieved, a 6 [place] JR 4 guide catheter was inserted.  A whisper coronary guidewire was used to cross the lesion.   The lesion was predilated with a 2.0 x 12 mm sprinter balloon.The lesion was somewhat resistant and it was subsequently dilated with a 3.0 x 15 mm sprinter balloon to 14 atmospheres on 2 inflations. I tried to pass a stent but I was unable to do so. I then passed a grand slam buddy wire. I was still unable to pass the stent. The lesion was then dilated with a 3.5 x 20 mm [place] Quantum apex balloon to 16 atmospheres on 2 inflations. I was able to pass a 3.25 x 23 mm Xience Xpedition drug-eluting stent. The stent was deployed at 18 atmospheres.  The stent was postdilated with the same 3.5 mm noncompliant balloon to high pressure.  Following PCI, there was 0% residual stenosis and TIMI-3 flow. Final angiography confirmed an excellent result. The  patient tolerated the procedure well. There were no immediate procedural complications. A TR band was used for radial hemostasis. The patient was transferred to the post catheterization  recovery area for further monitoring.  PCI Data: Vessel - RCA/Segment - mid Percent Stenosis (pre)  99 TIMI-flow 2 Stent 3.5 x 23 mm drug-eluting Percent Stenosis (post) 0 TIMI-flow (post) 3  Final Conclusions:   1. Severe RCA stenosis with successful PCI using a drug-eluting stent platform 2. Moderate LAD stenosis, stable from previous study in 2006 3. Continued patency of the left circumflex stent 4. Preserved LV function   Recommendations:  Continue long-term dual antiplatelet therapy with aspirin and Plavix   [name] 10/01/2011, 4:27 PM

## 2011-10-01 NOTE — Interval H&P Note (Signed)
History and Physical Interval Note:  10/01/2011 2:53 PM  Gregory Hansen  has presented today for surgery, with the diagnosis of chest pain  The various methods of treatment have been discussed with the patient and family. After consideration of risks, benefits and other options for treatment, the patient has consented to  Procedure(s) (LRB): LEFT HEART CATHETERIZATION WITH CORONARY ANGIOGRAM (N/A) as a surgical intervention .  The patient's history has been reviewed, patient examined, no change in status, stable for surgery.  I have reviewed the patient's chart and labs.  Questions were answered to the patient's satisfaction.    The patient presents for outpatient cardiac catheterization. He developed resting chest pain in the short stay area and was brought urgently over to the precath holding area and will undergo cardiac catheterization. He is now clearly having symptoms of unstable angina.   [name] 10/01/2011 2:53 PM

## 2011-10-02 DIAGNOSIS — I2510 Atherosclerotic heart disease of native coronary artery without angina pectoris: Secondary | ICD-10-CM

## 2011-10-02 DIAGNOSIS — I200 Unstable angina: Secondary | ICD-10-CM | POA: Diagnosis present

## 2011-10-02 LAB — CBC
HCT: 40.1 % (ref 39.0–52.0)
Platelets: 134 10*3/uL — ABNORMAL LOW (ref 150–400)
RDW: 12.4 % (ref 11.5–15.5)
WBC: 6.4 10*3/uL (ref 4.0–10.5)

## 2011-10-02 LAB — BASIC METABOLIC PANEL
BUN: 8 mg/dL (ref 6–23)
Chloride: 100 mEq/L (ref 96–112)
GFR calc Af Amer: >90 mL/min (ref >90)
Potassium: 4 mEq/L (ref 3.5–5.1)

## 2011-10-02 LAB — CARDIAC PANEL(CRET KIN+CKTOT+MB+TROPI)
Relative Index: INVALID (ref 0.0–2.5)
Total CK: 41 U/L (ref 7–232)

## 2011-10-02 LAB — LIPID PANEL: Total CHOL/HDL Ratio: 3.2 RATIO

## 2011-10-02 MED ORDER — FAMOTIDINE 20 MG PO TABS: 20.0000 mg | ORAL_TABLET | Freq: Two times a day (BID) | ORAL | Status: DC

## 2011-10-02 MED ORDER — ASPIRIN EC 81 MG PO TBEC: 81.0000 mg | DELAYED_RELEASE_TABLET | Freq: Every day | ORAL | Status: DC

## 2011-10-02 MED FILL — Dextrose Inj 5%: INTRAVENOUS | Qty: 2000 | Status: AC

## 2011-10-02 NOTE — Progress Notes (Deleted)
Error

## 2011-10-02 NOTE — Discharge Summary (Signed)
CARDIOLOGY DISCHARGE SUMMARY   Patient ID: Gregory Hansen MRN: [id number] DOB/AGE: 11/18/1950 61 y.o.  Admit date: 10/01/2011 Discharge date: 10/02/2011  Primary Discharge Diagnosis:    *Crescendo angina - s/p 3.25 x 23 mm Xience Xpedition drug-eluting stent to the mid-RCA   Secondary Discharge Diagnosis:  Past Medical History  Diagnosis Date  . Hyperlipidemia     mixed  . Spondylitis, ankylosing   . GERD (gastroesophageal reflux disease)   . Depression   . Hypertension   . Coronary artery disease     s/p multiple percutaneous interventions, PCI 202 for DMI and DES distal RCA and CFX-OM 2006   Procedures:Left Heart Cath, Selective Coronary Angiography, LV angiography, PTCA and stenting of the right coronary artery  Hospital Course: Gregory Hansen is a 61 year old male with a history of CAD. He was seen in the office and having progressive anginal symptoms. He was scheduled for a cardiac catheterization the next day and came in for the procedure on 10/01/2011.  The catheterization results are described below. He tolerated the procedure well. Post-procedure, he was able to ambulate without chest pain or SOB. His meds were reviewed and he is to stop the omeprazole and substitute Pepcid for GERD symptoms. He is aware of the med change and will comply.  On 10/02/2011, Gregory Hansen was seen by Dr Clifton James and by cardiac rehab. He was having no chest pain with ambulation and his cath site was without bruit or hematoma. He is considered stable for discharge, to follow up as an outpatient.   Labs:   Lab Results  Component Value Date   WBC 6.4 10/02/2011   HGB 13.8 10/02/2011   HCT 40.1 10/02/2011   MCV 96.4 10/02/2011   PLT 134* 10/02/2011    Lab 10/02/11 0325  NA 137  K 4.0  CL 100  CO2 26  BUN 8  CREATININE 0.82  CALCIUM 8.9  PROT --  BILITOT --  ALKPHOS --  ALT --  AST --  GLUCOSE 93    Basename 10/02/11 0325 10/01/11 2131  CKTOTAL 41 45  CKMB 2.2 2.3  CKMBINDEX -- --    TROPONINI <0.30 <0.30   Lipid Panel     Component Value Date/Time   CHOL 137 10/02/2011 0325   TRIG 119 10/02/2011 0325   HDL 43 10/02/2011 0325   CHOLHDL 3.2 10/02/2011 0325   VLDL 24 10/02/2011 0325   LDLCALC 70 10/02/2011 0325    Basename 09/30/11 1656  INR 0.98   Cardiac Cath: 10/01/2011 Left mainstem: The left main is a short segment with 30% stenosis.  Left anterior descending (LAD): The LAD is calcified. The proximal vessel at the level of the first diagonal has diffuse 50% stenosis. This is unchanged from the previous study. There is segmental disease at the junction of the mid and distal LAD with 70% stenosis. The LAD is small through this region and is not suitable for PCI. The first and second diagonal branches are both patent.  Left circumflex (LCx): The left circumflex has 30% ostial stenosis. The stented segment in the mid vessel is widely patent. The vessel gives off 2 obtuse marginal branches without significant stenosis.  Right coronary artery (RCA): The RCA is severely diseased. The vessel has nonobstructive stenosis proximally. The mid vessel has critical stenosis with tandem 99% lesions and filling defects suggestive of acute thrombus. The distal vessel is patent the PDA fills late from antegrade flow and also fills from left to right collaterals. The  posterolateral branch fills both antegrade and from left to right collaterals.  Left ventriculography: Left ventricular systolic function is normal, LVEF is estimated at 55-65%, there is no significant mitral regurgitation  PCI Data:  Vessel - RCA/Segment - mid  Percent Stenosis (pre) 99  TIMI-flow 2  Stent 3.5 x 23 mm drug-eluting  Percent Stenosis (post) 0  TIMI-flow (post) 3  Final Conclusions:  1. Severe RCA stenosis with successful PCI using a drug-eluting stent platform  2. Moderate LAD stenosis, stable from previous study in 2006  3. Continued patency of the left circumflex stent  4. Preserved LV function   EKG:  02-Oct-2011 04:44:35  Normal sinus rhythm Vent. rate 60 BPM PR interval 156 ms QRS duration 100 ms QT/QTc 426/426 ms P-R-T axes 55 57 68  FOLLOW UP PLANS AND APPOINTMENTS No Known Allergies   Medication List  As of 10/02/2011  9:10 AM   STOP taking these medications         omeprazole 20 MG capsule         TAKE these medications         aspirin EC 81 MG tablet   Take 1 tablet (81 mg total) by mouth daily.      buPROPion 150 MG 24 hr tablet   Commonly known as: WELLBUTRIN XL   Take 150 mg by mouth daily.      clopidogrel 75 MG tablet   Commonly known as: PLAVIX   TAKE 1 TABLET DAILY      CRESTOR 20 MG tablet   Generic drug: rosuvastatin   TAKE 1 TABLET AT BEDTIME      famotidine 20 MG tablet   Commonly known as: PEPCID   Take 1 tablet (20 mg total) by mouth 2 (two) times daily. Take 1 tab daily, can take BID PRN      ipratropium 0.06 % nasal spray   Commonly known as: ATROVENT   Place 2 sprays into the nose 2 (two) times daily.      metoprolol succinate 50 MG 24 hr tablet   Commonly known as: TOPROL-XL   TAKE 1 TABLET DAILY      multivitamin per tablet   Take 1 tablet by mouth daily.      nefazodone 100 MG tablet   Commonly known as: SERZONE   Take 200 mg by mouth every morning.      niacin 500 MG CR tablet   Commonly known as: NIASPAN   Take 1 tablet (500 mg total) by mouth daily.      nitroGLYCERIN 0.4 MG SL tablet   Commonly known as: NITROSTAT   Place 1 tablet (0.4 mg total) under the tongue every 5 (five) minutes as needed.      PRED FORTE 1 % ophthalmic suspension   Generic drug: prednisoLONE acetate   Place 1 drop into both eyes as needed.      ramipril 5 MG capsule   Commonly known as: ALTACE   Take 1 capsule (5 mg total) by mouth daily.             Follow-up Information    Follow up with Gregory Bollman, MD. (The office will call.)    Contact information:   [address] [place]  [address] [phone]         BRING ALL MEDICATIONS WITH YOU TO FOLLOW UP APPOINTMENTS  Time spent with patient to include physician time: 35 min Signed: Theodore Demark 10/02/2011, 7:23 AM Co-Sign MD

## 2011-10-02 NOTE — Progress Notes (Signed)
See full note this am. cdm 

## 2011-10-02 NOTE — Progress Notes (Signed)
SUBJECTIVE: No chest pain or SOB. NO events.   BP 138/80  Pulse 60  Temp 98.3 F (36.8 C) (Oral)  Resp 18  Ht 6\' 2"  (1.88 m)  Wt 240 lb 15.4 oz (109.3 kg)  BMI 30.94 kg/m2  SpO2 97%  Intake/Output Summary (Last 24 hours) at 10/02/11 1191 Last data filed at 10/02/11 0400  Gross per 24 hour  Intake   1120 ml  Output    400 ml  Net    720 ml    PHYSICAL EXAM General: Well developed, well nourished, in no acute distress. Alert and oriented x 3.  Psych:  Good affect, responds appropriately Neck: No JVD. No masses noted.  Lungs: Clear bilaterally with no wheezes or rhonci noted.  Heart: RRR with no murmurs noted. Abdomen: Bowel sounds are present. Soft, non-tender.  Extremities: No lower extremity edema.   LABS: Basic Metabolic Panel:  Basename 10/02/11 0325 09/30/11 1656  NA 137 138  K 4.0 4.0  CL 100 101  CO2 26 28  GLUCOSE 93 91  BUN 8 11  CREATININE 0.82 0.80  CALCIUM 8.9 9.1  MG -- --  PHOS -- --   CBC:  Basename 10/02/11 0325 09/30/11 1656  WBC 6.4 7.7  NEUTROABS -- 4.7  HGB 13.8 15.3  HCT 40.1 44.7  MCV 96.4 95.1  PLT 134* 171   Cardiac Enzymes:  Basename 10/02/11 0325 10/01/11 2131  CKTOTAL 41 45  CKMB 2.2 2.3  CKMBINDEX -- --  TROPONINI <0.30 <0.30   Fasting Lipid Panel:  Basename 10/02/11 0325  CHOL 137  HDL 43  LDLCALC 70  TRIG 119  CHOLHDL 3.2  LDLDIRECT --    Current Meds:    . aspirin  324 mg Oral Pre-Cath  . aspirin  81 mg Oral Daily  . atorvastatin  40 mg Oral q1800  . bivalirudin      . bivalirudin      . buPROPion  150 mg Oral Daily  . clopidogrel  75 mg Oral Q breakfast  . fentaNYL      . heparin      . heparin      . HYDROmorphone      . ipratropium  2 spray Nasal BID  . lidocaine      . metoprolol succinate  50 mg Oral Daily  . midazolam      . midazolam      . multivitamin with minerals  1 tablet Oral Daily  . nefazodone  200 mg Oral q morning - 10a  . niacin  500 mg Oral Daily  . nitroGLYCERIN        . pantoprazole  40 mg Oral Q1200  . ramipril  5 mg Oral Daily  . sodium chloride  3 mL Intravenous Q12H  . sodium chloride  3 mL Intravenous Q12H  . sodium chloride  3 mL Intravenous Q12H  . verapamil      . DISCONTD: aspirin  324 mg Oral NOW  . DISCONTD: aspirin  300 mg Rectal NOW  . DISCONTD: clopidogrel  75 mg Oral Pre-Cath  . DISCONTD: diazepam  5 mg Oral On Call  . DISCONTD: multivitamin  1 tablet Oral Daily     ASSESSMENT AND PLAN:  1. CAD: s/p PCI of RCA yesterday per Dr. Excell Seltzer with DES x 1 RCA. Doing well. Will need ASA and Plavix for at least one year. Will continue statin, beta blocker, Ace-inh.   2. Dispo: D/C home today. Follow up  with Dr. Excell Seltzer in 3-4 weeks.    Gregory Hansen  7/24/20137:12 AM

## 2011-10-02 NOTE — Progress Notes (Signed)
CARDIAC REHAB PHASE I   PRE:  Rate/Rhythm: 71SR  BP:  Supine: 158/90  Sitting:   Standing:    SaO2: 100%RA  MODE:  Ambulation: 700 ft   POST:  Rate/Rhythem: 93SR  BP:  Supine: 158/98  Sitting:   Standing:    SaO2: 98%RA 4540-9811 Pt walked 700 ft with steady gait. Tolerated well. Stated he could tell difference already in his walking without chest pressure. Education done. Declined CRP2. Has done before.  Duanne Limerick

## 2011-10-03 NOTE — Discharge Summary (Signed)
See full [email or web address] 

## 2011-10-16 ENCOUNTER — Encounter: Payer: Self-pay | Admitting: Physician Assistant

## 2011-10-16 ENCOUNTER — Ambulatory Visit (INDEPENDENT_AMBULATORY_CARE_PROVIDER_SITE_OTHER): Payer: BC Managed Care – PPO | Admitting: Physician Assistant

## 2011-10-16 VITALS — BP 108/75 | HR 63 | Ht 74.0 in | Wt 239.1 lb

## 2011-10-16 DIAGNOSIS — I10 Essential (primary) hypertension: Secondary | ICD-10-CM

## 2011-10-16 DIAGNOSIS — I2510 Atherosclerotic heart disease of native coronary artery without angina pectoris: Secondary | ICD-10-CM

## 2011-10-16 NOTE — Patient Instructions (Signed)
Your physician recommends that you schedule a follow-up appointment in: 3-4 months with Dr Excell Seltzer

## 2011-10-16 NOTE — Progress Notes (Signed)
HPI:   This is a 61 year old male patient with history of coronary artery disease was admitted for cardiac catheterization because of progressive anginal symptoms. He underwent successful PCI using a drug-eluting stent for severe RCA stenosis. He also has moderate LAD stenosis that is stable from previous study in 2006, and continued patency of the left circumflex stent. He had preserved LV function.  The patient is doing extremely well since discharge. He wrote his bike 20 miles last week without difficulty. He denies any chest pain, palpitations, dyspnea, dyspnea on exertion, dizziness, or presyncope. He had slight headache for her couple days after discharge but this has resolved.  No Known Allergies  Current Outpatient Prescriptions on File Prior to Visit: aspirin 81 MG tablet, Take 1 tablet (81 mg total) by mouth daily., Disp: , Rfl:  buPROPion (WELLBUTRIN XL) 150 MG 24 hr tablet, Take 150 mg by mouth daily.  , Disp: , Rfl:  clopidogrel (PLAVIX) 75 MG tablet, TAKE 1 TABLET DAILY, Disp: 90 tablet, Rfl: 2 CRESTOR 20 MG tablet, TAKE 1 TABLET AT BEDTIME, Disp: 90 tablet, Rfl: 6 famotidine (PEPCID) 20 MG tablet, Take 1 tablet (20 mg total) by mouth 2 (two) times daily. Take 1 tab daily, can take BID PRN, Disp: , Rfl:  ipratropium (ATROVENT) 0.06 % nasal spray, Place 2 sprays into the nose 2 (two) times daily., Disp: 15 mL, Rfl: 0 metoprolol succinate (TOPROL-XL) 50 MG 24 hr tablet, TAKE 1 TABLET DAILY, Disp: 90 tablet, Rfl: 0 multivitamin (THERAGRAN) per tablet, Take 1 tablet by mouth daily.  , Disp: , Rfl:  nefazodone (SERZONE) 100 MG tablet, Take 200 mg by mouth every morning. , Disp: , Rfl:  niacin (NIASPAN) 500 MG CR tablet, Take 1 tablet (500 mg total) by mouth daily., Disp: 90 tablet, Rfl: 1 nitroGLYCERIN (NITROSTAT) 0.4 MG SL tablet, Place 1 tablet (0.4 mg total) under the tongue every 5 (five) minutes as needed., Disp: 25 tablet, Rfl: 2 prednisoLONE acetate (PRED FORTE) 1 % ophthalmic  suspension, Place 1 drop into both eyes as needed.  , Disp: , Rfl:  ramipril (ALTACE) 5 MG capsule, Take 1 capsule (5 mg total) by mouth daily., Disp: 90 capsule, Rfl: 3    Past Medical History:   Hyperlipidemia                                                 Comment:mixed   Spondylitis, ankylosing                                      GERD (gastroesophageal reflux disease)                       Depression                                                   Hypertension  Coronary artery disease                                        Comment:s/p multiple percutaneous interventions, PCI               202 for DMI and DES distal RCA and CFX-OM 2006  Past Surgical History:   CARDIAC CATHETERIZATION                                        Comment:stent RCA June3, 2002, Stent OM/PTCA circumflex              August 13, 2000  Review of patient's family history indicates:   Heart disease                                             Comment: positive for cardiac disease and both               brothers have had cardiac events   Social History   Marital Status: Single              Spouse Name:                      Years of Education:                 Number of children:             Occupational History   None on file  Social History Main Topics   Smoking Status: Never Smoker                     Smokeless Status: Never Used                       Alcohol Use: Yes               Comment: glass of wine at dinner everyday.   Drug Use: No             Sexual Activity: Not on file        Other Topics            Concern   None on file  Social History Narrative   None on file    ROS:see history of present illness otherwise negative   PHYSICAL EXAM: Well-nournished, in no acute distress. Neck: No JVD, HJR, Bruit, or thyroid enlargement  Lungs: No tachypnea, clear without wheezing, rales, or rhonchi  Cardiovascular: RRR, PMI not displaced, heart  sounds normal, no murmurs, gallops, bruit, thrill, or heave.  Abdomen: BS normal. Soft without organomegaly, masses, lesions or tenderness.  Extremities:right radial artery without hematoma or hemorrhage, good Brachial pulse, lower extremities without cyanosis, clubbing or edema. Good distal pulses bilateral  SKin: Warm, no lesions or rashes   Musculoskeletal: No deformities  Neuro: no focal signs  BP 108/75  Pulse 63  Ht 6\' 2"  (1.88 m)  Wt 239 lb 1.9 oz (108.464 kg)  BMI 30.70 kg/m2   UJW:JXBJYN sinus rhythm, normal EKG   Cardiac Cath: 10/01/2011 Left mainstem: The left main is a short segment with 30% stenosis.  Left anterior descending (LAD): The LAD is calcified. The proximal vessel at the level of the first diagonal has diffuse 50% stenosis. This is unchanged from the previous study. There is segmental disease at the junction of the mid and distal LAD with 70% stenosis. The LAD is small through this region and is not suitable for PCI. The first and second diagonal branches are both patent.   Left circumflex (LCx): The left circumflex has 30% ostial stenosis. The stented segment in the mid vessel is widely patent. The vessel gives off 2 obtuse marginal branches without significant stenosis.   Right coronary artery (RCA): The RCA is severely diseased. The vessel has nonobstructive stenosis proximally. The mid vessel has critical stenosis with tandem 99% lesions and filling defects suggestive of acute thrombus. The distal vessel is patent the PDA fills late from antegrade flow and also fills from left to right collaterals. The posterolateral branch fills both antegrade and from left to right collaterals.   Left ventriculography: Left ventricular systolic function is normal, LVEF is estimated at 55-65%, there is no significant mitral regurgitation   PCI Data:   Vessel - RCA/Segment - mid   Percent Stenosis (pre) 99   TIMI-flow 2   Stent 3.5 x 23 mm drug-eluting   Percent Stenosis  (post) 0   TIMI-flow (post) 3   Final Conclusions:   1. Severe RCA stenosis with successful PCI using a drug-eluting stent platform   2. Moderate LAD stenosis, stable from previous study in 2006   3. Continued patency of the left circumflex stent   4. Preserved LV function

## 2011-10-16 NOTE — Assessment & Plan Note (Signed)
stable °

## 2011-10-16 NOTE — Assessment & Plan Note (Signed)
Patient underwent drug-eluting stent for severe RCA stenosis on 10/01/11, and she has moderate LAD stenosis stable from previous study in 2006, and continued patency of the left circumflex stent, with preserved LV function. Patient is doing well without cardiac symptoms. He is going to try and plant based diet to see if this helps.

## 2011-11-05 ENCOUNTER — Other Ambulatory Visit: Payer: Self-pay | Admitting: Cardiovascular Disease

## 2011-12-22 ENCOUNTER — Other Ambulatory Visit: Payer: Self-pay | Admitting: Cardiovascular Disease

## 2012-01-06 ENCOUNTER — Telehealth: Payer: Self-pay | Admitting: Cardiovascular Disease

## 2012-01-06 NOTE — Telephone Encounter (Signed)
Pt's niaspan  was denied, pt calling to see if he's not to be taking it anymore? pls call [phone] to [facility]

## 2012-01-10 NOTE — Telephone Encounter (Signed)
He can stop niaspan. Would repeat lipids and lft's in 12 weeks.

## 2012-01-10 NOTE — Telephone Encounter (Signed)
I spoke with the pt and made him aware of Dr Cooper's response. Pt scheduled for labs in February.

## 2012-01-15 ENCOUNTER — Other Ambulatory Visit: Payer: BC Managed Care – PPO

## 2012-01-22 ENCOUNTER — Ambulatory Visit (INDEPENDENT_AMBULATORY_CARE_PROVIDER_SITE_OTHER): Payer: BC Managed Care – PPO | Admitting: Cardiovascular Disease

## 2012-01-22 ENCOUNTER — Encounter: Payer: Self-pay | Admitting: Cardiovascular Disease

## 2012-01-22 VITALS — BP 128/80 | HR 96 | Ht 74.0 in | Wt 239.0 lb

## 2012-01-22 DIAGNOSIS — I2510 Atherosclerotic heart disease of native coronary artery without angina pectoris: Secondary | ICD-10-CM

## 2012-01-22 DIAGNOSIS — E785 Hyperlipidemia, unspecified: Secondary | ICD-10-CM

## 2012-01-22 MED ORDER — ATORVASTATIN CALCIUM 40 MG PO TABS: 40.0000 mg | ORAL_TABLET | Freq: Every day | ORAL | Status: DC

## 2012-01-22 NOTE — Patient Instructions (Addendum)
Stop Crestor.  Start Lipitor 40mg  daily.  Your physician recommends that you return for fasting lab work in: 3 months - Lipid and Liver  Your physician wants you to follow-up in: 6 months with Dr. Excell Seltzer.  You will receive a reminder letter in the mail two months in advance. If you don't receive a letter, please call our office to schedule the follow-up appointment.

## 2012-01-23 ENCOUNTER — Other Ambulatory Visit: Payer: Self-pay | Admitting: Cardiovascular Disease

## 2012-01-23 NOTE — Telephone Encounter (Signed)
Spoke with patient and he states he had an appointment with Dr. Excell Seltzer yesterday and he asked his questions then.

## 2012-01-23 NOTE — Telephone Encounter (Signed)
New problem ° ° ° °Question regarding medication °

## 2012-01-27 ENCOUNTER — Ambulatory Visit (INDEPENDENT_AMBULATORY_CARE_PROVIDER_SITE_OTHER): Payer: BC Managed Care – PPO | Admitting: Family Medicine

## 2012-01-27 VITALS — BP 128/74 | HR 74 | Temp 98.1°F | Resp 18 | Ht 74.0 in | Wt 237.0 lb

## 2012-01-27 DIAGNOSIS — J0100 Acute maxillary sinusitis, unspecified: Secondary | ICD-10-CM

## 2012-01-27 MED ORDER — SULFAMETHOXAZOLE-TRIMETHOPRIM 800-160 MG PO TABS: 1.0000 | ORAL_TABLET | Freq: Two times a day (BID) | ORAL | Status: DC

## 2012-01-27 MED ORDER — FLUTICASONE PROPIONATE 50 MCG/ACT NA SUSP: 2.0000 | Freq: Every day | NASAL | Status: DC

## 2012-01-27 NOTE — Patient Instructions (Addendum)
Lots of steam exposure and hot showers to try to open up your nares and your sinuses  Sinusitis Sinusitis is redness, soreness, and swelling (inflammation) of the paranasal sinuses. Paranasal sinuses are air pockets within the bones of your face (beneath the eyes, the middle of the forehead, or above the eyes). In healthy paranasal sinuses, mucus is able to drain out, and air is able to circulate through them by way of your nose. However, when your paranasal sinuses are inflamed, mucus and air can become trapped. This can allow bacteria and other germs to grow and cause infection. Sinusitis can develop quickly and last only a short time (acute) or continue over a long period (chronic). Sinusitis that lasts for more than 12 weeks is considered chronic.  CAUSES  Causes of sinusitis include:  Allergies.  Structural abnormalities, such as displacement of the cartilage that separates your nostrils (deviated septum), which can decrease the air flow through your nose and sinuses and affect sinus drainage.  Functional abnormalities, such as when the small hairs (cilia) that line your sinuses and help remove mucus do not work properly or are not present. SYMPTOMS  Symptoms of acute and chronic sinusitis are the same. The primary symptoms are pain and pressure around the affected sinuses. Other symptoms include:  Upper toothache.  Earache.  Headache.  Bad breath.  Decreased sense of smell and taste.  A cough, which worsens when you are lying flat.  Fatigue.  Fever.  Thick drainage from your nose, which often is green and may contain pus (purulent).  Swelling and warmth over the affected sinuses. DIAGNOSIS  Your caregiver will perform a physical exam. During the exam, your caregiver may:  Look in your nose for signs of abnormal growths in your nostrils (nasal polyps).  Tap over the affected sinus to check for signs of infection.  View the inside of your sinuses (endoscopy) with a  special imaging device with a light attached (endoscope), which is inserted into your sinuses. If your caregiver suspects that you have chronic sinusitis, one or more of the following tests may be recommended:  Allergy tests.  Nasal culture A sample of mucus is taken from your nose and sent to a lab and screened for bacteria.  Nasal cytology A sample of mucus is taken from your nose and examined by your caregiver to determine if your sinusitis is related to an allergy. TREATMENT  Most cases of acute sinusitis are related to a viral infection and will resolve on their own within 10 days. Sometimes medicines are prescribed to help relieve symptoms (pain medicine, decongestants, nasal steroid sprays, or saline sprays).  However, for sinusitis related to a bacterial infection, your caregiver will prescribe antibiotic medicines. These are medicines that will help kill the bacteria causing the infection.  Rarely, sinusitis is caused by a fungal infection. In theses cases, your caregiver will prescribe antifungal medicine. For some cases of chronic sinusitis, surgery is needed. Generally, these are cases in which sinusitis recurs more than 3 times per year, despite other treatments. HOME CARE INSTRUCTIONS   Drink plenty of water. Water helps thin the mucus so your sinuses can drain more easily.  Use a humidifier.  Inhale steam 3 to 4 times a day (for example, sit in the bathroom with the shower running).  Apply a warm, moist washcloth to your face 3 to 4 times a day, or as directed by your caregiver.  Use saline nasal sprays to help moisten and clean your sinuses.  Take  over-the-counter or prescription medicines for pain, discomfort, or fever only as directed by your caregiver. SEEK IMMEDIATE MEDICAL CARE IF:  You have increasing pain or severe headaches.  You have nausea, vomiting, or drowsiness.  You have swelling around your face.  You have vision problems.  You have a stiff  neck.  You have difficulty breathing. MAKE SURE YOU:   Understand these instructions.  Will watch your condition.  Will get help right away if you are not doing well or get worse. Document Released: 02/25/2005 Document Revised: 05/20/2011 Document Reviewed: 03/12/2011 [facility] Patient Information 2013 [place], [place].

## 2012-01-27 NOTE — Progress Notes (Signed)
Subjective:    Patient ID: Gregory Hansen, male    DOB: 1951-01-17, 61 y.o.   MRN: [id number]  HPI  Sxs started w/ head cold several wks ago and if ears don't unplug and eustachian tubes drain he always gets an ear infection.  And yesterday he started having right ear pain.  This a.m. He developed redness and puffiness around right nose over maxiallary sinuses which is tender.  Using afrin for sev day but had to stop so was using an old rx nasal spray (ipratropium??) but it ran out and once a day loratadine. Uses sinus rinse every day w/ partially dried blood and green large chuncks out of nose. Severe postnasal drip w/ laying so has to sleep sitting up  Past Medical History  Diagnosis Date  . Hyperlipidemia     mixed  . Spondylitis, ankylosing   . GERD (gastroesophageal reflux disease)   . Depression   . Hypertension   . Coronary artery disease     s/p multiple percutaneous interventions, PCI 202 for DMI and DES distal RCA and CFX-OM 2006  . Myocardial infarction   . Anxiety    Current Outpatient Prescriptions on File Prior to Visit  Medication Sig Dispense Refill  . aspirin 81 MG tablet Take 1 tablet (81 mg total) by mouth daily.      Marland Kitchen atorvastatin (LIPITOR) 40 MG tablet Take 1 tablet (40 mg total) by mouth daily.  90 tablet  3  . buPROPion (WELLBUTRIN XL) 150 MG 24 hr tablet Take 300 mg by mouth daily.       . clopidogrel (PLAVIX) 75 MG tablet TAKE 1 TABLET DAILY  90 tablet  2  . desonide (DESOWEN) 0.05 % lotion Apply 1 application topically daily as needed.      . famotidine (PEPCID) 20 MG tablet Take 20 mg by mouth. Take 1 tab daily, can take BID PRN      . loratadine (CLARITIN) 10 MG tablet Take 10 mg by mouth daily.      . metoprolol succinate (TOPROL-XL) 50 MG 24 hr tablet TAKE 1 TABLET DAILY  90 tablet  3  . multivitamin (THERAGRAN) per tablet Take 1 tablet by mouth daily.        . nefazodone (SERZONE) 100 MG tablet Take 200 mg by mouth every morning.       . nitroGLYCERIN  (NITROSTAT) 0.4 MG SL tablet Place 1 tablet (0.4 mg total) under the tongue every 5 (five) minutes as needed.  25 tablet  2  . prednisoLONE acetate (PRED FORTE) 1 % ophthalmic suspension Place 1 drop into both eyes as needed.        . ramipril (ALTACE) 5 MG capsule TAKE 1 CAPSULE DAILY  90 capsule  2  . fluticasone (FLONASE) 50 MCG/ACT nasal spray Place 2 sprays into the nose daily.  16 g  6  . ipratropium (ATROVENT) 0.06 % nasal spray Place 2 sprays into the nose 2 (two) times daily.  15 mL  0  . LEVITRA 20 MG tablet          Review of Systems  Constitutional: Positive for chills, diaphoresis and fatigue. Negative for fever, activity change, appetite change and unexpected weight change.  HENT: Positive for ear pain, nosebleeds, congestion, facial swelling, rhinorrhea, sneezing, postnasal drip and sinus pressure. Negative for hearing loss, sore throat, neck pain, neck stiffness and ear discharge.   Respiratory: Negative for cough and shortness of breath.   Cardiovascular: Negative for chest pain.  Gastrointestinal: Negative for nausea, vomiting, abdominal pain, diarrhea and constipation.  Musculoskeletal: Negative for myalgias and arthralgias.  Skin: Positive for color change and rash.  Neurological: Positive for headaches.  Hematological: Negative for adenopathy.  Psychiatric/Behavioral: Positive for sleep disturbance.      BP 128/74  Pulse 74  Temp 98.1 F (36.7 C) (Oral)  Resp 18  Ht 6\' 2"  (1.88 m)  Wt 237 lb (107.502 kg)  BMI 30.43 kg/m2  SpO2 98% Objective:   Physical Exam  Constitutional: He is oriented to person, place, and time. He appears well-developed and well-nourished. No distress.  HENT:  Head: Normocephalic and atraumatic.  Right Ear: External ear and ear canal normal. Tympanic membrane is injected.  Left Ear: External ear and ear canal normal. Tympanic membrane is injected.  Nose: Mucosal edema, rhinorrhea and sinus tenderness present. Epistaxis is observed.  Right sinus exhibits maxillary sinus tenderness. Left sinus exhibits maxillary sinus tenderness.  Mouth/Throat: Uvula is midline and mucous membranes are normal. Posterior oropharyngeal erythema present. No oropharyngeal exudate or posterior oropharyngeal edema.  Eyes: Conjunctivae normal are normal. No scleral icterus.  Neck: Normal range of motion. Neck supple. No thyromegaly present.  Cardiovascular: Normal rate, regular rhythm, normal heart sounds and intact distal pulses.   Pulmonary/Chest: Effort normal and breath sounds normal. No respiratory distress.  Musculoskeletal: He exhibits no edema.  Lymphadenopathy:       Head (right side): Submandibular adenopathy present. No tonsillar, no preauricular and no posterior auricular adenopathy present.       Head (left side): Submandibular adenopathy present. No tonsillar, no preauricular and no posterior auricular adenopathy present.    He has no cervical adenopathy.       Right: No supraclavicular adenopathy present.       Left: No supraclavicular adenopathy present.  Neurological: He is alert and oriented to person, place, and time.  Skin: Skin is warm and dry. He is not diaphoretic. There is erythema.       Erythema and edema over right maxillary area extending up side right side of nose  Psychiatric: He has a normal mood and affect. His behavior is normal.      Assessment & Plan:   1. Sinusitis, acute maxillary  sulfamethoxazole-trimethoprim (BACTRIM DS,SEPTRA DS) 800-160 MG per tablet, fluticasone (FLONASE) 50 MCG/ACT nasal spray  gave fast track card for recheck as I am concerned about the erythema and edema on his face - hopefully this is related to his sinusitis but want to ensure no concurrent cellulitis/erysipelas - RTC in 2d if no improvement or worsening and RTC 3d if improving Pt will continue steam, hot showers, sinus rinse as well. If still w/ infection, cons cbc and augmenting w/ z-pack or keflex and imaging. If improving but  still cong at f/u, can consider adding prednisone.

## 2012-01-28 ENCOUNTER — Other Ambulatory Visit: Payer: Self-pay

## 2012-01-28 DIAGNOSIS — E7800 Pure hypercholesterolemia, unspecified: Secondary | ICD-10-CM

## 2012-01-30 ENCOUNTER — Ambulatory Visit (INDEPENDENT_AMBULATORY_CARE_PROVIDER_SITE_OTHER): Payer: BC Managed Care – PPO | Admitting: Emergency Medicine

## 2012-01-30 VITALS — BP 122/82 | HR 80 | Temp 98.6°F | Resp 18 | Wt 241.0 lb

## 2012-01-30 DIAGNOSIS — L03211 Cellulitis of face: Secondary | ICD-10-CM

## 2012-01-30 DIAGNOSIS — L0390 Cellulitis, unspecified: Secondary | ICD-10-CM

## 2012-01-30 DIAGNOSIS — J329 Chronic sinusitis, unspecified: Secondary | ICD-10-CM

## 2012-01-30 DIAGNOSIS — L0201 Cutaneous abscess of face: Secondary | ICD-10-CM

## 2012-01-30 DIAGNOSIS — J019 Acute sinusitis, unspecified: Secondary | ICD-10-CM

## 2012-01-30 NOTE — Progress Notes (Signed)
  Subjective:    Patient ID: Gregory Hansen, male    DOB: [birth year], 61 y.o.   MRN: [id number]  HPI Pt here today for recheck. Pt states he is feeling much better. The swelling went down and the pain subsided. Patient was seen by Dr. Clelia Croft and treated with Septra. He is feeling significantly better. The redness is improving with some scaling. The swelling around his right eye has also improved the     Review of Systems     Objective:   Physical Exam is redness involving the forehead and some over both cheeks. His TMs are clear. The nose is reveals some dry crusted erythema and bleeding. Throat is normal. Chest is clear to        Assessment & Plan:  Patient is definitely improved on Septra. He'll finish out his full course of antibiotics. Recheck only if he starts to worsen again.

## 2012-02-09 ENCOUNTER — Encounter: Payer: Self-pay | Admitting: Cardiovascular Disease

## 2012-02-09 NOTE — Progress Notes (Signed)
HPI:  61 year old gentleman presenting for followup evaluation. The patient has coronary artery disease and initially presented in 2002 with an inferior wall MI. He initially was treated with bare-metal stenting of the right coronary artery and later underwent drug-eluting stent implantation in the right coronary artery and left circumflex vessels. He presented in July with class IV anginal symptoms and was taken urgently to the cardiac cath lab. He was found to have critical stenosis in the right coronary artery and was treated with a drug-eluting stent. He has been maintained on long-term dual antiplatelet therapy with aspirin and Plavix. He is having no further anginal symptoms. He complains of generalized fatigue. He's been trying to increase the amount of exercise he gets. He specifically denies exertional chest pain, dyspnea, palpitations, or edema.  Outpatient Encounter Prescriptions as of 01/22/2012  Medication Sig Dispense Refill  . aspirin 81 MG tablet Take 1 tablet (81 mg total) by mouth daily.      Marland Kitchen buPROPion (WELLBUTRIN XL) 150 MG 24 hr tablet Take 300 mg by mouth daily.       . clopidogrel (PLAVIX) 75 MG tablet TAKE 1 TABLET DAILY  90 tablet  2  . desonide (DESOWEN) 0.05 % lotion Apply 1 application topically daily as needed.      . famotidine (PEPCID) 20 MG tablet Take 20 mg by mouth. Take 1 tab daily, can take BID PRN      . ipratropium (ATROVENT) 0.06 % nasal spray Place 2 sprays into the nose 2 (two) times daily.  15 mL  0  . metoprolol succinate (TOPROL-XL) 50 MG 24 hr tablet TAKE 1 TABLET DAILY  90 tablet  3  . multivitamin (THERAGRAN) per tablet Take 1 tablet by mouth daily.        . nefazodone (SERZONE) 100 MG tablet Take 200 mg by mouth every morning.       . nitroGLYCERIN (NITROSTAT) 0.4 MG SL tablet Place 1 tablet (0.4 mg total) under the tongue every 5 (five) minutes as needed.  25 tablet  2  . prednisoLONE acetate (PRED FORTE) 1 % ophthalmic suspension Place 1 drop into  both eyes as needed.        . ramipril (ALTACE) 5 MG capsule TAKE 1 CAPSULE DAILY  90 capsule  2  . [DISCONTINUED] CRESTOR 20 MG tablet TAKE 1 TABLET AT BEDTIME  90 tablet  6  . [DISCONTINUED] famotidine (PEPCID) 20 MG tablet Take 1 tablet (20 mg total) by mouth 2 (two) times daily. Take 1 tab daily, can take BID PRN      . atorvastatin (LIPITOR) 40 MG tablet Take 1 tablet (40 mg total) by mouth daily.  90 tablet  3    No Known Allergies  Past Medical History  Diagnosis Date  . Hyperlipidemia     mixed  . Spondylitis, ankylosing   . GERD (gastroesophageal reflux disease)   . Depression   . Hypertension   . Coronary artery disease     s/p multiple percutaneous interventions, PCI 202 for DMI and DES distal RCA and CFX-OM 2006  . Myocardial infarction   . Anxiety     ROS: Negative except as per HPI  BP 128/80  Pulse 96  Ht 6\' 2"  (1.88 m)  Wt 108.41 kg (239 lb)  BMI 30.69 kg/m2  SpO2 98%  PHYSICAL EXAM: Pt is alert and oriented, NAD HEENT: normal Neck: JVP - normal, carotids 2+= without bruits Lungs: CTA bilaterally CV: RRR without murmur or gallop Abd:  soft, NT, Positive BS, no hepatomegaly Ext: no C/C/E, distal pulses intact and equal Skin: warm/dry no rash  ASSESSMENT AND PLAN: 1. Coronary artery disease, native vessel. Continue dual antiplatelet therapy with aspirin and Plavix. Continue risk reduction measures and regular exercise.  2. Hyperlipidemia. The patient's most recent lipids were reviewed. He will return in 3 months for lipids and LFTs. He remains on a statin drug. Last LDL is 70 mg/dL.  3. Hypertension. Blood pressure controlled on ramipril and metoprolol  [name] 02/09/2012 10:18 PM

## 2012-03-26 ENCOUNTER — Ambulatory Visit (INDEPENDENT_AMBULATORY_CARE_PROVIDER_SITE_OTHER): Payer: BC Managed Care – PPO | Admitting: Emergency Medicine

## 2012-03-26 VITALS — BP 149/81 | HR 76 | Temp 98.0°F | Resp 16 | Ht 74.0 in | Wt 241.4 lb

## 2012-03-26 DIAGNOSIS — N529 Male erectile dysfunction, unspecified: Secondary | ICD-10-CM

## 2012-03-26 DIAGNOSIS — J0100 Acute maxillary sinusitis, unspecified: Secondary | ICD-10-CM

## 2012-03-26 LAB — TESTOSTERONE: Testosterone: 222.24 ng/dL — ABNORMAL LOW (ref 300–890)

## 2012-03-26 MED ORDER — PSEUDOEPHEDRINE-GUAIFENESIN ER 60-600 MG PO TB12: 1.0000 | ORAL_TABLET | Freq: Two times a day (BID) | ORAL | Status: DC

## 2012-03-26 MED ORDER — SILDENAFIL CITRATE 100 MG PO TABS: 100.0000 mg | ORAL_TABLET | Freq: Every day | ORAL | Status: DC | PRN

## 2012-03-26 MED ORDER — AMOXICILLIN-POT CLAVULANATE 875-125 MG PO TABS: 1.0000 | ORAL_TABLET | Freq: Two times a day (BID) | ORAL | Status: DC

## 2012-03-26 NOTE — Progress Notes (Signed)
[facility] [address] [place] [address] [id number]  Date:  03/26/2012   Name:  JAMERE STIDHAM   DOB:  12/24/1950   MRN:  [id number]  PCP:  No primary provider on file.    Chief Complaint: Sinusitis   History of Present Illness:  APOLLOS TENBRINK is a 62 y.o. very pleasant male patient who presents with the following:  Ill since yesterday with nasal congestion and and post nasal drainage.  Has tenderness and pain in right maxillary sinus. Just over the flu a week ago.  No fever or chills, myalgias or arthralgias.  No cough.   Erectile dysfunction not responding well to levitra  Patient Active Problem List  Diagnosis  . HYPERLIPIDEMIA-MIXED  . DEPRESSION  . HYPERTENSION, BENIGN  . CAD, UNSPECIFIED SITE  . GERD  . SPONDYLITIS, ANKYLOSING  . Exertional angina  . Crescendo angina    Past Medical History  Diagnosis Date  . Hyperlipidemia     mixed  . Spondylitis, ankylosing   . GERD (gastroesophageal reflux disease)   . Depression   . Hypertension   . Coronary artery disease     s/p multiple percutaneous interventions, PCI 202 for DMI and DES distal RCA and CFX-OM 2006  . Myocardial infarction   . Anxiety     Past Surgical History  Procedure Date  . Cardiac catheterization     stent RCA June3, 2002, Stent OM/PTCA circumflex August 13, 2000  . Coronary angioplasty with stent placement     first one in 2006; had another place 3 months ago (August 2013)    History  Substance Use Topics  . Smoking status: Never Smoker   . Smokeless tobacco: Never Used  . Alcohol Use: 8.4 oz/week    14 Glasses of wine per week     Comment: glass of wine at dinner everyday.    Family History  Problem Relation Age of Onset  . Heart disease      positive for cardiac disease and both brothers have had cardiac events  . Breast cancer Mother   . Heart disease Father   . Heart disease Brother   . Leukemia Maternal Grandmother   . Heart disease Maternal  Grandfather     No Known Allergies  Medication list has been reviewed and updated.  Current Outpatient Prescriptions on File Prior to Visit  Medication Sig Dispense Refill  . atorvastatin (LIPITOR) 40 MG tablet Take 1 tablet (40 mg total) by mouth daily.  90 tablet  3  . buPROPion (WELLBUTRIN XL) 150 MG 24 hr tablet Take 300 mg by mouth daily.       . clopidogrel (PLAVIX) 75 MG tablet TAKE 1 TABLET DAILY  90 tablet  2  . famotidine (PEPCID) 20 MG tablet Take 20 mg by mouth. Take 1 tab daily, can take BID PRN      . fluticasone (FLONASE) 50 MCG/ACT nasal spray Place 2 sprays into the nose daily.  16 g  6  . metoprolol succinate (TOPROL-XL) 50 MG 24 hr tablet TAKE 1 TABLET DAILY  90 tablet  3  . multivitamin (THERAGRAN) per tablet Take 1 tablet by mouth daily.        . nefazodone (SERZONE) 100 MG tablet Take 200 mg by mouth every morning.       . nitroGLYCERIN (NITROSTAT) 0.4 MG SL tablet Place 1 tablet (0.4 mg total) under the tongue every 5 (five) minutes as needed.  25 tablet  2  . ramipril (ALTACE) 5 MG capsule TAKE 1 CAPSULE DAILY  90 capsule  2  . sulfamethoxazole-trimethoprim (BACTRIM DS,SEPTRA DS) 800-160 MG per tablet Take 1 tablet by mouth 2 (two) times daily.  28 tablet  0  . acidophilus (RISAQUAD) CAPS Take 1 capsule by mouth daily.      Marland Kitchen aspirin 81 MG tablet Take 1 tablet (81 mg total) by mouth daily.      . CRESTOR 20 MG tablet       . desonide (DESOWEN) 0.05 % lotion Apply 1 application topically daily as needed.      Marland Kitchen ipratropium (ATROVENT) 0.06 % nasal spray Place 2 sprays into the nose 2 (two) times daily.  15 mL  0  . LEVITRA 20 MG tablet       . loratadine (CLARITIN) 10 MG tablet Take 10 mg by mouth daily.      . prednisoLONE acetate (PRED FORTE) 1 % ophthalmic suspension Place 1 drop into both eyes as needed.          Review of Systems:  As per HPI, otherwise negative.    Physical Examination: Filed Vitals:   03/26/12 1345  BP: 149/81  Pulse: 76  Temp: 98  F (36.7 C)  Resp: 16   Filed Vitals:   03/26/12 1345  Height: 6\' 2"  (1.88 m)  Weight: 241 lb 6.4 oz (109.498 kg)   Body mass index is 30.99 kg/(m^2). Ideal Body Weight: Weight in (lb) to have BMI = 25: 194.3   GEN: WDWN, NAD, Non-toxic, A & O x 3 HEENT: Atraumatic, Normocephalic. Neck supple. No masses, No LAD.  Cellulitis over right maxillary sinus Ears and Nose: No external deformity.  Purulent nasal drainage CV: RRR, No M/G/R. No JVD. No thrill. No extra heart sounds. PULM: CTA B, no wheezes, crackles, rhonchi. No retractions. No resp. distress. No accessory muscle use. ABD: S, NT, ND, +BS. No rebound. No HSM. EXTR: No c/c/e NEURO Normal gait.  PSYCH: Normally interactive. Conversant. Not depressed or anxious appearing.  Calm demeanor.    Assessment and Plan: Sinusitis augmentin mucinex  Erectile dysfunction testosterone   Carmelina Dane, MD

## 2012-03-26 NOTE — Patient Instructions (Addendum)

## 2012-04-02 MED ORDER — TESTOSTERONE 20.25 MG/ACT (1.62%) TD GEL: 4.0000 | TRANSDERMAL | Status: DC

## 2012-04-02 NOTE — Addendum Note (Signed)
Addended by: Carmelina Dane on: 04/02/2012 02:42 PM   Modules accepted: Orders

## 2012-04-06 ENCOUNTER — Telehealth: Payer: Self-pay

## 2012-04-06 NOTE — Telephone Encounter (Signed)
PT WAS PRESCRIBED ANDROGEL BY DR. Dareen Piano.  HE HAS CONTACTED PHARMACY TWICE AND THEY HAVE NEVER RECEIVED THE SCRIPT FROM [place].  USES [facility] ON [address]  HIS NUMBER IS [phone]

## 2012-04-06 NOTE — Telephone Encounter (Signed)
Called patient to advise  °

## 2012-04-15 NOTE — Progress Notes (Signed)
I had completed prior auth form for Androgel 1.62% and faxed in on 04/09/12. Called today to check status and was called back later to notify that PA has been approved through the lifetime of policy. Faxed notice to pharmacy.

## 2012-04-16 ENCOUNTER — Other Ambulatory Visit (INDEPENDENT_AMBULATORY_CARE_PROVIDER_SITE_OTHER): Payer: BC Managed Care – PPO

## 2012-04-16 DIAGNOSIS — E785 Hyperlipidemia, unspecified: Secondary | ICD-10-CM

## 2012-04-16 DIAGNOSIS — E7800 Pure hypercholesterolemia, unspecified: Secondary | ICD-10-CM

## 2012-04-16 LAB — LIPID PANEL
HDL: 51.5 mg/dL (ref >39.00)
Total CHOL/HDL Ratio: 3

## 2012-04-16 LAB — HEPATIC FUNCTION PANEL
ALT: 57 U/L — ABNORMAL HIGH (ref 0–53)
AST: 53 U/L — ABNORMAL HIGH (ref 0–37)
Bilirubin, Direct: 0.2 mg/dL (ref 0.0–0.3)
Total Bilirubin: 1 mg/dL (ref 0.3–1.2)

## 2012-04-16 LAB — CK: Total CK: 67 U/L (ref 7–232)

## 2012-04-23 ENCOUNTER — Other Ambulatory Visit: Payer: BC Managed Care – PPO

## 2012-04-26 ENCOUNTER — Other Ambulatory Visit: Payer: Self-pay | Admitting: Cardiovascular Disease

## 2012-04-28 ENCOUNTER — Encounter: Payer: Self-pay | Admitting: Cardiovascular Disease

## 2012-04-28 NOTE — Telephone Encounter (Signed)
This encounter was created in error - please disregard.

## 2012-04-28 NOTE — Telephone Encounter (Signed)
Pt was wondering what his lab results where because it is time to renew his meds and he wants to make sure nothing is going to change prior to him getting refills

## 2012-04-29 ENCOUNTER — Other Ambulatory Visit: Payer: Self-pay | Admitting: *Deleted

## 2012-04-29 MED ORDER — SILDENAFIL CITRATE 100 MG PO TABS: 100.0000 mg | ORAL_TABLET | Freq: Every day | ORAL | Status: DC | PRN

## 2012-05-06 ENCOUNTER — Ambulatory Visit (INDEPENDENT_AMBULATORY_CARE_PROVIDER_SITE_OTHER): Payer: BC Managed Care – PPO | Admitting: Physician Assistant

## 2012-05-06 VITALS — BP 136/78 | HR 84 | Temp 98.1°F | Resp 18 | Ht 74.0 in | Wt 240.0 lb

## 2012-05-06 DIAGNOSIS — J0100 Acute maxillary sinusitis, unspecified: Secondary | ICD-10-CM

## 2012-05-06 DIAGNOSIS — L0390 Cellulitis, unspecified: Secondary | ICD-10-CM

## 2012-05-06 DIAGNOSIS — L0291 Cutaneous abscess, unspecified: Secondary | ICD-10-CM

## 2012-05-06 MED ORDER — SULFAMETHOXAZOLE-TRIMETHOPRIM 800-160 MG PO TABS: 1.0000 | ORAL_TABLET | Freq: Two times a day (BID) | ORAL | Status: DC

## 2012-05-06 MED ORDER — GUAIFENESIN ER 1200 MG PO TB12: 1.0000 | ORAL_TABLET | Freq: Two times a day (BID) | ORAL | Status: DC | PRN

## 2012-05-06 MED ORDER — FLUTICASONE PROPIONATE 50 MCG/ACT NA SUSP: 2.0000 | Freq: Every day | NASAL | Status: DC

## 2012-05-06 NOTE — Patient Instructions (Signed)
Get plenty of rest and drink at least 64 ounces of water daily. Restart the Flonase Nasal Spray, and use it daily. Restart a saline nasal spray/mist to moisturize the nasal passages and reduce the bleeding.  Vaseline inside the nostrils (applied with the tip of your pinky finger) can also help. Continue the Loratadine.

## 2012-05-06 NOTE — Progress Notes (Signed)
Subjective:    Patient ID: Gregory Hansen, male    DOB: [birth year], 62 y.o.   MRN: [id number]  HPI  This 62 y.o. male presents for evaluation of Sinusitis and accompanying redness and swelling of the skin on the right cheek.  This is the third episode of the same symptoms, previously las fall, and again last month.  Each time, he was treated with antibiotics with resolution (Septra DS then Augmentin).  Each episode comes on quickly (he reports that he was fine when he went to bed last night, and symptoms were present upon waking).  Describes sinus pressure and drainage, scratchy throat, ear pressure and fullness.  Mild cough.  No fever, chills, GI/GU symptoms.  He has chronic allergies, for which he uses daily loratadine.  Last fall he was given atrovent nasal spray, and last month Flonase, but he stopped each one when his symptoms resolved.  He previously used a neti pot, but stopped when advised to by a previous provider, as a potential cause of his sinusitis/cellulitis.  Past Medical History  Diagnosis Date  . Hyperlipidemia     mixed  . Spondylitis, ankylosing   . GERD (gastroesophageal reflux disease)   . Depression   . Hypertension   . Coronary artery disease     s/p multiple percutaneous interventions, PCI 202 for DMI and DES distal RCA and CFX-OM 2006  . Myocardial infarction   . Anxiety     Past Surgical History  Procedure Laterality Date  . Cardiac catheterization      stent RCA June3, 2002, Stent OM/PTCA circumflex August 13, 2000  . Coronary angioplasty with stent placement      first one in 2006; had another place 3 months ago (August 2013)    Prior to Admission medications   Medication Sig Start Date End Date Taking? Authorizing Provider  aspirin 81 MG tablet Take 1 tablet (81 mg total) by mouth daily. 10/02/11  Yes Rhonda G Barrett, PA  atorvastatin (LIPITOR) 40 MG tablet Take 1 tablet (40 mg total) by mouth daily. 01/22/12  Yes [name], MD  buPROPion  (WELLBUTRIN XL) 150 MG 24 hr tablet Take 300 mg by mouth daily.    Yes Historical Provider, MD  clopidogrel (PLAVIX) 75 MG tablet TAKE 1 TABLET DAILY 04/26/12  Yes [name], MD  desonide (DESOWEN) 0.05 % lotion Apply 1 application topically daily as needed.   Yes Historical Provider, MD  famotidine (PEPCID) 20 MG tablet Take 20 mg by mouth. Take 1 tab daily, can take BID PRN 10/02/11 10/01/12 Yes Rhonda G Barrett, PA  loratadine (CLARITIN) 10 MG tablet Take 10 mg by mouth daily.   Yes Historical Provider, MD  metoprolol succinate (TOPROL-XL) 50 MG 24 hr tablet TAKE 1 TABLET DAILY 11/05/11  Yes [name], MD  multivitamin [facility]) per tablet Take 1 tablet by mouth daily.     Yes Historical Provider, MD  nefazodone (SERZONE) 100 MG tablet Take 200 mg by mouth every morning.    Yes Historical Provider, MD  nitroGLYCERIN (NITROSTAT) 0.4 MG SL tablet Place 1 tablet (0.4 mg total) under the tongue every 5 (five) minutes as needed. 01/01/11  Yes [name], MD  ramipril (ALTACE) 5 MG capsule TAKE 1 CAPSULE DAILY 12/22/11  Yes [name], MD  sildenafil (VIAGRA) 100 MG tablet Take 1 tablet (100 mg total) by mouth daily as needed for erectile dysfunction. 04/29/12  Yes Phillips Odor, MD  Testosterone (ANDROGEL PUMP) 20.25 MG/ACT (1.62%) GEL Place 4  Act onto the skin 1 day or 1 dose. 04/02/12  Yes Phillips Odor, MD  fluticasone (FLONASE) 50 MCG/ACT nasal spray Place 2 sprays into the nose daily. 01/27/12   [name], MD  prednisoLONE acetate (PRED FORTE) 1 % ophthalmic suspension Place 1 drop into both eyes as needed.      Historical Provider, MD    No Known Allergies  History   Social History  . Marital Status: Single    Spouse Name: n/a    Number of Children: 2  . Years of Education: N/A   Occupational History  . antique fountain pens    Social History Main Topics  . Smoking status: Former Smoker    Quit date: 05/06/1982  . Smokeless tobacco: Never Used  . Alcohol Use:  8.4 oz/week    14 Glasses of wine per week     Comment: glass of wine at dinner everyday.  . Drug Use: No  . Sexually Active: Yes    Birth Control/ Protection: Condom   Other Topics Concern  . Not on file   Social History Narrative   His adult son and daughter live with him, saving money for graduate school.    Family History  Problem Relation Age of Onset  . Heart disease      positive for cardiac disease and both brothers have had cardiac events  . Breast cancer Mother   . Heart disease Father   . Heart disease Brother   . Leukemia Maternal Grandmother   . Heart disease Maternal Grandfather     Review of Systems As above.    Objective:   Physical Exam Blood pressure 136/78, pulse 84, temperature 98.1 F (36.7 C), temperature source Oral, resp. rate 18, height 6\' 2"  (1.88 m), weight 240 lb (108.863 kg), SpO2 97.00%. Body mass index is 30.8 kg/(m^2). Well-developed, well nourished WM who is awake, alert and oriented, in NAD. HEENT: [place]/AT, PERRL, EOMI.  Sclera and conjunctiva are clear.  EAC are patent, TMs are normal in appearance. Nasal mucosa is congested,pink and moist, wit crusted drainage and some old blood noted between the turbinates. OP is clear. Mild tenderness with palpation of the RIGHT frontal and maxillary sinuses. Neck: supple, non-tender, no thyromegaly. Mild fullness of the RIGHT tonsillar area without discrete lymphadenopathy. Heart: RRR, no murmur Lungs: normal effort, CTA Extremities: no cyanosis, clubbing or edema. Skin: warm and dry with redness and induration of the skin of the RIGHT cheek and side of the nose.  The skin is non-tender, there is no drainage. Psychologic: good mood and appropriate affect, normal speech and behavior.        Assessment & Plan:  Sinusitis, acute, maxillary - Plan: Guaifenesin (MUCINEX MAXIMUM STRENGTH) 1200 MG TB12, sulfamethoxazole-trimethoprim (BACTRIM DS,SEPTRA DS) 800-160 MG per tablet  Cellulitis - Plan:  sulfamethoxazole-trimethoprim (BACTRIM DS,SEPTRA DS) 800-160 MG per tablet  Patient Instructions  Get plenty of rest and drink at least 64 ounces of water daily. Restart the Flonase Nasal Spray, and use it daily. Restart a saline nasal spray/mist to moisturize the nasal passages and reduce the bleeding.  Vaseline inside the nostrils (applied with the tip of your pinky finger) can also help. Continue the Loratadine.   RTC if symptoms worsen/persist/recur.

## 2012-10-01 ENCOUNTER — Other Ambulatory Visit: Payer: Self-pay | Admitting: Cardiovascular Disease

## 2012-11-05 ENCOUNTER — Other Ambulatory Visit: Payer: Self-pay | Admitting: Cardiovascular Disease

## 2012-11-11 ENCOUNTER — Encounter: Payer: Self-pay | Admitting: Cardiovascular Disease

## 2012-11-11 ENCOUNTER — Ambulatory Visit (INDEPENDENT_AMBULATORY_CARE_PROVIDER_SITE_OTHER): Payer: BC Managed Care – PPO | Admitting: Cardiovascular Disease

## 2012-11-11 VITALS — BP 146/92 | HR 61 | Ht 74.0 in | Wt 244.0 lb

## 2012-11-11 DIAGNOSIS — E785 Hyperlipidemia, unspecified: Secondary | ICD-10-CM

## 2012-11-11 DIAGNOSIS — I2510 Atherosclerotic heart disease of native coronary artery without angina pectoris: Secondary | ICD-10-CM

## 2012-11-11 DIAGNOSIS — I10 Essential (primary) hypertension: Secondary | ICD-10-CM

## 2012-11-11 LAB — HEPATIC FUNCTION PANEL
ALT: 67 U/L — ABNORMAL HIGH (ref 0–53)
Bilirubin, Direct: 0.1 mg/dL (ref 0.0–0.3)
Total Bilirubin: 0.9 mg/dL (ref 0.3–1.2)

## 2012-11-11 LAB — LIPID PANEL
HDL: 54.4 mg/dL (ref >39.00)
LDL Cholesterol: 94 mg/dL (ref 0–99)
Total CHOL/HDL Ratio: 3
VLDL: 18.6 mg/dL (ref 0.0–40.0)

## 2012-11-11 MED ORDER — RAMIPRIL 10 MG PO CAPS: ORAL_CAPSULE | ORAL | Status: DC

## 2012-11-11 NOTE — Progress Notes (Signed)
HPI:   62 year-old gentleman presenting for follow-up evaluation.  The patient has coronary artery disease and initially presented in 2002 with an inferior wall MI. He initially was treated with bare-metal stenting of the right coronary artery and later underwent drug-eluting stent implantation in the right coronary artery and left circumflex vessels. He presented in July 2013 with class IV anginal symptoms and was taken urgently to the cardiac cath lab. He was found to have critical stenosis in the right coronary artery and was treated with a drug-eluting stent. Last lipids February 2014 showed a cholesterol of 159, Triglycerides 110, HDL 51, and LDL 86. Transaminases were mildly elevated but unchanged from previous.  The patient complains of lack of energy. He's gained some weight. He tried testosterone replacement but became fearful of the cardiovascular effects. He denies chest pain, chest pressure, dyspnea, or edema. No anginal symptoms since his PCI last year. No bleeding problems on ASA/plavix combination.  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  Outpatient Encounter Prescriptions as of 11/11/2012  Medication Sig Dispense Refill  . aspirin 81 MG tablet Take 1 tablet (81 mg total) by mouth daily.      Marland Kitchen atorvastatin (LIPITOR) 40 MG tablet Take 1 tablet (40 mg total) by mouth daily.  90 tablet  3  . buPROPion (WELLBUTRIN XL) 150 MG 24 hr tablet Take 300 mg by mouth daily.       . clopidogrel (PLAVIX) 75 MG tablet TAKE 1 TABLET DAILY  90 tablet  3  . desonide (DESOWEN) 0.05 % lotion Apply 1 application topically daily as needed.      . famotidine (PEPCID) 20 MG tablet Take 20 mg by mouth. Take 1 tab daily, can take BID PRN      . fluticasone (FLONASE) 50 MCG/ACT nasal spray Place 2 sprays into the nose daily.  48 g  3  . loratadine (CLARITIN) 10 MG tablet Take 10 mg by mouth daily.      . metoprolol succinate (TOPROL-XL) 50  MG 24 hr tablet TAKE 1 TABLET DAILY  90 tablet  2  . multivitamin (THERAGRAN) per tablet Take 1 tablet by mouth daily.        . nefazodone (SERZONE) 200 MG tablet TAKE ONE AND ONE-HALF DAILY AD S DIRECTED      . nitroGLYCERIN (NITROSTAT) 0.4 MG SL tablet Place 1 tablet (0.4 mg total) under the tongue every 5 (five) minutes as needed.  25 tablet  2  . prednisoLONE acetate (PRED FORTE) 1 % ophthalmic suspension Place 1 drop into both eyes as needed.        . Probiotic Product (PROBIOTIC DAILY PO) Take by mouth. AS DIRECTED      . ramipril (ALTACE) 5 MG capsule TAKE 1 CAPSULE DAILY  90 capsule  1  . sildenafil (VIAGRA) 100 MG tablet Take 1 tablet (100 mg total) by mouth daily as needed for erectile dysfunction.  36 tablet  3  . [DISCONTINUED] Guaifenesin (MUCINEX MAXIMUM STRENGTH) 1200 MG TB12 Take 1 tablet (1,200 mg total) by mouth every 12 (twelve) hours as needed.  14 tablet  1  . [DISCONTINUED] nefazodone (SERZONE) 100 MG tablet Take 200 mg by mouth every morning.       . [DISCONTINUED] sulfamethoxazole-trimethoprim (BACTRIM DS,SEPTRA DS) 800-160 MG per tablet Take 1 tablet by mouth 2 (two) times daily.  20 tablet  0  . [DISCONTINUED] Testosterone (ANDROGEL PUMP) 20.25 MG/ACT (  1.62%) GEL Place 4 Act onto the skin 1 day or 1 dose.  300 g  5   No facility-administered encounter medications on file as of 11/11/2012.    No Known Allergies  Past Medical History  Diagnosis Date  . Hyperlipidemia     mixed  . Spondylitis, ankylosing   . GERD (gastroesophageal reflux disease)   . Depression   . Hypertension   . Coronary artery disease     s/p multiple percutaneous interventions, PCI 202 for DMI and DES distal RCA and CFX-OM 2006  . Myocardial infarction   . Anxiety    ROS: Negative except as per HPI  Pulse 61  Ht 6\' 2"  (1.88 m)  Wt 244 lb (110.678 kg)  BMI 31.31 kg/m2  PHYSICAL EXAM: Pt is alert and oriented, pleasant overweight male in NAD HEENT: normal Neck: JVP - normal, carotids  2+= without bruits Lungs: CTA bilaterally CV: RRR without murmur or gallop Abd: soft, NT, Positive BS, no hepatomegaly Ext: no C/C/E, distal pulses intact and equal Skin: warm/dry no rash  EKG:  NSR with age-indeterminate inferior MI. HR 61 bpm.  ASSESSMENT AND PLAN: 1. CAD, native vessel. No anginal symptoms. Continue current medical therapies. His meds were reviewed today. We discussed pros and cons of testosterone replacement and he understands that this is a risk:benefit, but I do not think his CV risk is prohibitive.  2. Hyperlipidemia. Repeat labs today. Discussion of lifestyle modification. Continue statin drug.  3. HTN. BP elevated. Increase ramipril to 10 mg daily.  [name] 11/11/2012 12:47 PM

## 2012-11-11 NOTE — Patient Instructions (Addendum)
Your physician recommends that you have lab work today: LIPID and LIVER   Your physician wants you to follow-up in: 1 YEAR with Dr Excell Seltzer.  You will receive a reminder letter in the mail two months in advance. If you don't receive a letter, please call our office to schedule the follow-up appointment.  Your physician has recommended you make the following change in your medication: INCREASE Ramipril to 10mg  take one by mouth daily

## 2013-01-14 ENCOUNTER — Other Ambulatory Visit: Payer: Self-pay

## 2013-01-28 ENCOUNTER — Other Ambulatory Visit: Payer: Self-pay | Admitting: Emergency Medicine

## 2013-01-28 NOTE — Telephone Encounter (Signed)
Dr Dareen Piano, do you want to RF? Pt is overdue for recheck of testosterone level. I put a note on pended RF "PATIENT NEEDS OFFICE VISIT/LABS FOR ADDITIONAL REFILLS" for your review if you want to give him a RF. "

## 2013-02-01 ENCOUNTER — Other Ambulatory Visit: Payer: Self-pay | Admitting: Cardiovascular Disease

## 2013-02-03 ENCOUNTER — Other Ambulatory Visit: Payer: Self-pay | Admitting: Emergency Medicine

## 2013-02-10 ENCOUNTER — Other Ambulatory Visit: Payer: Self-pay | Admitting: Emergency Medicine

## 2013-02-12 ENCOUNTER — Telehealth: Payer: Self-pay

## 2013-03-25 ENCOUNTER — Encounter: Payer: Self-pay | Admitting: Cardiovascular Disease

## 2013-03-25 ENCOUNTER — Encounter (HOSPITAL_COMMUNITY): Payer: Self-pay | Admitting: Pharmacy Technician

## 2013-03-25 ENCOUNTER — Ambulatory Visit (INDEPENDENT_AMBULATORY_CARE_PROVIDER_SITE_OTHER): Payer: BC Managed Care – PPO | Admitting: Cardiovascular Disease

## 2013-03-25 ENCOUNTER — Encounter: Payer: Self-pay | Admitting: Nurse Practitioner

## 2013-03-25 ENCOUNTER — Other Ambulatory Visit: Payer: Self-pay | Admitting: Cardiovascular Disease

## 2013-03-25 VITALS — BP 138/89 | HR 73 | Ht 74.0 in | Wt 244.0 lb

## 2013-03-25 DIAGNOSIS — I2510 Atherosclerotic heart disease of native coronary artery without angina pectoris: Secondary | ICD-10-CM

## 2013-03-25 DIAGNOSIS — I208 Other forms of angina pectoris: Secondary | ICD-10-CM

## 2013-03-25 DIAGNOSIS — I209 Angina pectoris, unspecified: Secondary | ICD-10-CM

## 2013-03-25 LAB — BASIC METABOLIC PANEL
BUN: 11 mg/dL (ref 6–23)
CO2: 30 meq/L (ref 19–32)
Calcium: 10 mg/dL (ref 8.4–10.5)
Chloride: 99 mEq/L (ref 96–112)
Creatinine, Ser: 1 mg/dL (ref 0.4–1.5)
GFR: 79.17 mL/min (ref >60.00)
GLUCOSE: 96 mg/dL (ref 70–99)
POTASSIUM: 4.7 meq/L (ref 3.5–5.1)
Sodium: 136 mEq/L (ref 135–145)

## 2013-03-25 LAB — CBC WITH DIFFERENTIAL/PLATELET
Basophils Absolute: 0.1 10*3/uL (ref 0.0–0.1)
Basophils Relative: 0.8 % (ref 0.0–3.0)
EOS PCT: 6.4 % — AB (ref 0.0–5.0)
Eosinophils Absolute: 0.4 10*3/uL (ref 0.0–0.7)
HCT: 48.7 % (ref 39.0–52.0)
Hemoglobin: 16.6 g/dL (ref 13.0–17.0)
Lymphocytes Relative: 26.3 % (ref 12.0–46.0)
Lymphs Abs: 1.7 10*3/uL (ref 0.7–4.0)
MCHC: 34 g/dL (ref 30.0–36.0)
MCV: 95.4 fl (ref 78.0–100.0)
MONOS PCT: 14.3 % — AB (ref 3.0–12.0)
Monocytes Absolute: 0.9 10*3/uL (ref 0.1–1.0)
NEUTROS PCT: 52.2 % (ref 43.0–77.0)
Neutro Abs: 3.3 10*3/uL (ref 1.4–7.7)
PLATELETS: 235 10*3/uL (ref 150.0–400.0)
RBC: 5.1 Mil/uL (ref 4.22–5.81)
RDW: 13 % (ref 11.5–14.6)
WBC: 6.3 10*3/uL (ref 4.5–10.5)

## 2013-03-25 LAB — PROTIME-INR
INR: 1 ratio (ref 0.8–1.0)
Prothrombin Time: 10.8 s (ref 10.2–12.4)

## 2013-03-25 NOTE — Progress Notes (Signed)
  HPI:   62-year-old gentleman presenting for follow up evaluation.  The patient has coronary artery disease and initially presented in 2002 with an inferior wall MI. He initially was treated with bare-metal stenting of the right coronary artery and later underwent drug-eluting stent implantation in the right coronary artery and left circumflex vessels. He presented in July 2013 with class IV anginal symptoms and was taken urgently to the cardiac cath lab. He was found to have critical stenosis in the right coronary artery and was treated with a drug-eluting stent.  Most recent lipid 11/11/2012 showed a cholesterol of 167, triglycerides 93, HDL 54, and LDL 94. LFTs were mildly elevated with an AST of 53 and an ALT of 67.  The patient has been watching his diet very closely. He's been eating a lot of baked fish and salads. He has tried to initiate an exercise program. Unfortunately, he has developed exertional discomfort in his epigastric region and left arm. This is consistently related to exertion within a few minutes of walking. He has to stop and rest and his symptoms resolved within 5 minutes. This is similar to anginal symptoms he's had in the past. Symptoms have been present for 2 months and have slowly progressed. There is mild exertional dyspnea associated with this. There is no diaphoresis, lightheadedness, or syncope. He remains compliant with aspirin and Plavix.  Outpatient Encounter Prescriptions as of 03/25/2013  Medication Sig  . aspirin 81 MG tablet Take 1 tablet (81 mg total) by mouth daily.  . atorvastatin (LIPITOR) 40 MG tablet TAKE 1 TABLET DAILY  . buPROPion (WELLBUTRIN XL) 150 MG 24 hr tablet Take 300 mg by mouth daily.   . clopidogrel (PLAVIX) 75 MG tablet TAKE 1 TABLET DAILY  . desonide (DESOWEN) 0.05 % lotion Apply 1 application topically daily as needed.  . famotidine (PEPCID) 20 MG tablet Take 20 mg by mouth. Take 1 tab daily, can take BID PRN  . fluticasone (FLONASE) 50  MCG/ACT nasal spray Place 2 sprays into the nose daily.  . loratadine (CLARITIN) 10 MG tablet Take 10 mg by mouth daily.  . metoprolol succinate (TOPROL-XL) 50 MG 24 hr tablet TAKE 1 TABLET DAILY  . multivitamin (THERAGRAN) per tablet Take 1 tablet by mouth daily.    . nefazodone (SERZONE) 200 MG tablet TAKE ONE AND ONE-HALF DAILY AD S DIRECTED  . nitroGLYCERIN (NITROSTAT) 0.4 MG SL tablet Place 1 tablet (0.4 mg total) under the tongue every 5 (five) minutes as needed.  . prednisoLONE acetate (PRED FORTE) 1 % ophthalmic suspension Place 1 drop into both eyes as needed.    . ramipril (ALTACE) 10 MG capsule TAKE 1 CAPSULE DAILY  . sildenafil (VIAGRA) 100 MG tablet Take 1 tablet (100 mg total) by mouth daily as needed for erectile dysfunction.  . Testosterone (ANDROGEL PUMP) 20.25 MG/ACT (1.62%) GEL USE 2 PUMPS ON SKIN DAILY  . [DISCONTINUED] ANDROGEL PUMP 20.25 MG/ACT (1.62%) GEL USE 4 PUMPS ON SKIN DAILY  . Probiotic Product (PROBIOTIC DAILY PO) Take by mouth. AS DIRECTED    No Known Allergies  Past Medical History  Diagnosis Date  . Hyperlipidemia     mixed  . Spondylitis, ankylosing   . GERD (gastroesophageal reflux disease)   . Depression   . Hypertension   . Coronary artery disease     s/p multiple percutaneous interventions, PCI 202 for DMI and DES distal RCA and CFX-OM 2006  . Myocardial infarction   . Anxiety     ROS:   Negative except as per HPI  BP 138/89  Pulse 73  Ht 6\' 2"  (1.88 m)  Wt 244 lb (110.678 kg)  BMI 31.31 kg/m2  PHYSICAL EXAM: Pt is alert and oriented, NAD HEENT: normal Neck: JVP - normal, carotids 2+= without bruits Lungs: CTA bilaterally CV: RRR without murmur or gallop Abd: soft, NT, Positive BS, no hepatomegaly Ext: no C/C/E, distal pulses intact and equal Skin: warm/dry no rash  EKG:  Normal sinus rhythm 73 beats per minute, within normal limits.  ASSESSMENT AND PLAN: 1. CAD, native vessel. The patient is experiencing new onset CCS class III  anginal symptoms. He's on background therapy with aspirin, Plavix, long-acting metoprolol, and atorvastatin. The last time he had these symptoms, we found critical stenosis of the right coronary artery. I have reviewed options of advancing medical therapy versus immediate invasive evaluation with cardiac catheterization and possible PCI. He prefers the latter and I agree with this approach. I have reviewed the risks, indications, and alternatives to cardiac catheterization and possible PCI. He understands the serious risks such as stroke, heart attack, major vascular injury, and need for emergency CABG are rare. He agrees to proceed.  2. Hyperlipidemia. The patient is maintained on atorvastatin 40 mg daily. Lipids reviewed as above.  3. Hypertension. Blood pressure is controlled on metoprolol succinate and ramipril.  [name] 03/25/2013 10:30 AM

## 2013-03-25 NOTE — Patient Instructions (Signed)
Your physician has requested that you have a cardiac catheterization. Cardiac catheterization is used to diagnose and/or treat various heart conditions. Doctors may recommend this procedure for a number of different reasons. The most common reason is to evaluate chest pain. Chest pain can be a symptom of coronary artery disease (CAD), and cardiac catheterization can show whether plaque is narrowing or blocking your heart's arteries. This procedure is also used to evaluate the valves, as well as measure the blood flow and oxygen levels in different parts of your heart. For further information please visit www.[email or web address]. Please follow instruction sheet, as given.   Your physician recommends that you continue on your current medications as directed. Please refer to the Current Medication list given to you today.  

## 2013-03-26 ENCOUNTER — Encounter (HOSPITAL_COMMUNITY)
Admission: RE | Disposition: A | Discharge status: Home / Self Care | Payer: Self-pay | Source: Ambulatory Visit | Attending: Cardiovascular Disease

## 2013-03-26 ENCOUNTER — Ambulatory Visit (HOSPITAL_COMMUNITY)
Admission: RE | Admit: 2013-03-26 | Discharge: 2013-03-27 | Disposition: A | Discharge status: Home / Self Care | Payer: BC Managed Care – PPO | Source: Ambulatory Visit | Attending: Cardiovascular Disease | Admitting: Cardiovascular Disease

## 2013-03-26 ENCOUNTER — Encounter (HOSPITAL_COMMUNITY): Payer: Self-pay | Admitting: General Practice

## 2013-03-26 DIAGNOSIS — I252 Old myocardial infarction: Secondary | ICD-10-CM | POA: Insufficient documentation

## 2013-03-26 DIAGNOSIS — F411 Generalized anxiety disorder: Secondary | ICD-10-CM | POA: Insufficient documentation

## 2013-03-26 DIAGNOSIS — I2510 Atherosclerotic heart disease of native coronary artery without angina pectoris: Secondary | ICD-10-CM | POA: Insufficient documentation

## 2013-03-26 DIAGNOSIS — K219 Gastro-esophageal reflux disease without esophagitis: Secondary | ICD-10-CM | POA: Insufficient documentation

## 2013-03-26 DIAGNOSIS — I209 Angina pectoris, unspecified: Secondary | ICD-10-CM | POA: Insufficient documentation

## 2013-03-26 DIAGNOSIS — E782 Mixed hyperlipidemia: Secondary | ICD-10-CM | POA: Insufficient documentation

## 2013-03-26 DIAGNOSIS — F329 Major depressive disorder, single episode, unspecified: Secondary | ICD-10-CM | POA: Insufficient documentation

## 2013-03-26 DIAGNOSIS — I10 Essential (primary) hypertension: Secondary | ICD-10-CM | POA: Insufficient documentation

## 2013-03-26 DIAGNOSIS — F3289 Other specified depressive episodes: Secondary | ICD-10-CM | POA: Insufficient documentation

## 2013-03-26 HISTORY — PX: LEFT HEART CATHETERIZATION WITH CORONARY ANGIOGRAM: SHX5451

## 2013-03-26 HISTORY — DX: Unspecified osteoarthritis, unspecified site: M19.90

## 2013-03-26 HISTORY — PX: CORONARY ANGIOPLASTY WITH STENT PLACEMENT: SHX49

## 2013-03-26 LAB — POCT ACTIVATED CLOTTING TIME
ACTIVATED CLOTTING TIME: 260 s
Activated Clotting Time: 282 seconds

## 2013-03-26 LAB — PLATELET INHIBITION P2Y12: Platelet Function  P2Y12: 88 [PRU] — ABNORMAL LOW (ref 194–418)

## 2013-03-26 SURGERY — LEFT HEART CATHETERIZATION WITH CORONARY ANGIOGRAM
Anesthesia: LOCAL

## 2013-03-26 MED ORDER — ISOSORBIDE MONONITRATE 15 MG HALF TABLET
15.0000 mg | ORAL_TABLET | Freq: Every day | ORAL | Status: DC
  Administered 2013-03-26 – 2013-03-27 (×2): 15 mg via ORAL
  Filled 2013-03-26 (×2): qty 1

## 2013-03-26 MED ORDER — SODIUM CHLORIDE 0.9 % IJ SOLN: 3.0000 mL | INTRAMUSCULAR | Status: DC | PRN

## 2013-03-26 MED ORDER — SODIUM CHLORIDE 0.9 % IV SOLN: 250.0000 mL | INTRAVENOUS | Status: DC | PRN

## 2013-03-26 MED ORDER — HEPARIN (PORCINE) IN NACL 2-0.9 UNIT/ML-% IJ SOLN
INTRAMUSCULAR | Status: AC
  Filled 2013-03-26: qty 1000

## 2013-03-26 MED ORDER — LORATADINE 10 MG PO TABS
10.0000 mg | ORAL_TABLET | Freq: Every day | ORAL | Status: DC
  Filled 2013-03-26: qty 1

## 2013-03-26 MED ORDER — ASPIRIN 81 MG PO CHEW
81.0000 mg | CHEWABLE_TABLET | ORAL | Status: AC
  Administered 2013-03-26: 81 mg via ORAL

## 2013-03-26 MED ORDER — PANTOPRAZOLE SODIUM 20 MG PO TBEC
20.0000 mg | DELAYED_RELEASE_TABLET | Freq: Two times a day (BID) | ORAL | Status: DC
  Administered 2013-03-26: 20 mg via ORAL
  Filled 2013-03-26 (×4): qty 1

## 2013-03-26 MED ORDER — ACETAMINOPHEN 325 MG PO TABS: 650.0000 mg | ORAL_TABLET | ORAL | Status: DC | PRN

## 2013-03-26 MED ORDER — NEFAZODONE HCL 150 MG PO TABS
300.0000 mg | ORAL_TABLET | Freq: Every day | ORAL | Status: DC
  Filled 2013-03-26 (×2): qty 2

## 2013-03-26 MED ORDER — FENTANYL CITRATE 0.05 MG/ML IJ SOLN
INTRAMUSCULAR | Status: AC
  Filled 2013-03-26: qty 2

## 2013-03-26 MED ORDER — RAMIPRIL 10 MG PO CAPS
10.0000 mg | ORAL_CAPSULE | Freq: Every day | ORAL | Status: DC
  Filled 2013-03-26: qty 1

## 2013-03-26 MED ORDER — DIAZEPAM 5 MG PO TABS
5.0000 mg | ORAL_TABLET | ORAL | Status: AC
  Administered 2013-03-26: 5 mg via ORAL

## 2013-03-26 MED ORDER — FAMOTIDINE 20 MG PO TABS
20.0000 mg | ORAL_TABLET | Freq: Two times a day (BID) | ORAL | Status: DC
  Filled 2013-03-26: qty 1

## 2013-03-26 MED ORDER — METOPROLOL SUCCINATE ER 50 MG PO TB24
50.0000 mg | ORAL_TABLET | Freq: Every day | ORAL | Status: DC
  Filled 2013-03-26: qty 1

## 2013-03-26 MED ORDER — MIDAZOLAM HCL 2 MG/2ML IJ SOLN
INTRAMUSCULAR | Status: AC
  Filled 2013-03-26: qty 2

## 2013-03-26 MED ORDER — NITROGLYCERIN 0.2 MG/ML ON CALL CATH LAB
INTRAVENOUS | Status: AC
  Filled 2013-03-26: qty 1

## 2013-03-26 MED ORDER — ASPIRIN 81 MG PO CHEW
CHEWABLE_TABLET | ORAL | Status: AC
  Administered 2013-03-26: 81 mg via ORAL
  Filled 2013-03-26: qty 1

## 2013-03-26 MED ORDER — TESTOSTERONE 20.25 MG/ACT (1.62%) TD GEL: 2.0000 | Freq: Every day | TRANSDERMAL | Status: DC

## 2013-03-26 MED ORDER — BUPROPION HCL ER (XL) 300 MG PO TB24
300.0000 mg | ORAL_TABLET | Freq: Every day | ORAL | Status: DC
  Filled 2013-03-26: qty 1

## 2013-03-26 MED ORDER — SODIUM CHLORIDE 0.9 % IJ SOLN: 3.0000 mL | Freq: Two times a day (BID) | INTRAMUSCULAR | Status: DC

## 2013-03-26 MED ORDER — ONDANSETRON HCL 4 MG/2ML IJ SOLN: 4.0000 mg | Freq: Four times a day (QID) | INTRAMUSCULAR | Status: DC | PRN

## 2013-03-26 MED ORDER — MULTIVITAMINS PO TABS: 1.0000 | ORAL_TABLET | Freq: Every day | ORAL | Status: DC

## 2013-03-26 MED ORDER — OXYCODONE-ACETAMINOPHEN 5-325 MG PO TABS: 1.0000 | ORAL_TABLET | ORAL | Status: DC | PRN

## 2013-03-26 MED ORDER — IBUPROFEN 800 MG PO TABS
800.0000 mg | ORAL_TABLET | Freq: Once | ORAL | Status: AC
  Administered 2013-03-26: 800 mg via ORAL
  Filled 2013-03-26: qty 1

## 2013-03-26 MED ORDER — ADULT MULTIVITAMIN W/MINERALS CH
1.0000 | ORAL_TABLET | Freq: Every day | ORAL | Status: DC
  Filled 2013-03-26: qty 1

## 2013-03-26 MED ORDER — ASPIRIN EC 81 MG PO TBEC
81.0000 mg | DELAYED_RELEASE_TABLET | Freq: Every day | ORAL | Status: DC
  Filled 2013-03-26: qty 1

## 2013-03-26 MED ORDER — NITROGLYCERIN 0.4 MG SL SUBL: 0.4000 mg | SUBLINGUAL_TABLET | SUBLINGUAL | Status: DC | PRN

## 2013-03-26 MED ORDER — SODIUM CHLORIDE 0.9 % IV SOLN
INTRAVENOUS | Status: DC
  Administered 2013-03-26: 09:00:00 via INTRAVENOUS

## 2013-03-26 MED ORDER — MORPHINE SULFATE 2 MG/ML IJ SOLN: 2.0000 mg | INTRAMUSCULAR | Status: DC | PRN

## 2013-03-26 MED ORDER — SODIUM CHLORIDE 0.9 % IV SOLN
1.0000 mL/kg/h | INTRAVENOUS | Status: AC
  Administered 2013-03-26: 1 mL/kg/h via INTRAVENOUS

## 2013-03-26 MED ORDER — FLUTICASONE PROPIONATE 50 MCG/ACT NA SUSP
2.0000 | Freq: Every day | NASAL | Status: DC
  Filled 2013-03-26: qty 16

## 2013-03-26 MED ORDER — ATORVASTATIN CALCIUM 40 MG PO TABS
40.0000 mg | ORAL_TABLET | Freq: Every day | ORAL | Status: DC
  Administered 2013-03-26: 18:00:00 40 mg via ORAL
  Filled 2013-03-26 (×2): qty 1

## 2013-03-26 MED ORDER — VERAPAMIL HCL 2.5 MG/ML IV SOLN
INTRAVENOUS | Status: AC
  Filled 2013-03-26: qty 2

## 2013-03-26 MED ORDER — DIAZEPAM 5 MG PO TABS
ORAL_TABLET | ORAL | Status: AC
  Administered 2013-03-26: 5 mg via ORAL
  Filled 2013-03-26: qty 1

## 2013-03-26 MED ORDER — HEPARIN SODIUM (PORCINE) 1000 UNIT/ML IJ SOLN
INTRAMUSCULAR | Status: AC
  Filled 2013-03-26: qty 1

## 2013-03-26 MED ORDER — CLOPIDOGREL BISULFATE 75 MG PO TABS
75.0000 mg | ORAL_TABLET | Freq: Every day | ORAL | Status: DC
  Filled 2013-03-26: qty 1

## 2013-03-26 MED ORDER — LIDOCAINE HCL (PF) 1 % IJ SOLN
INTRAMUSCULAR | Status: AC
  Filled 2013-03-26: qty 30

## 2013-03-26 NOTE — H&P (View-Only) (Signed)
HPI:   63 year old gentleman presenting for follow up evaluation.  The patient has coronary artery disease and initially presented in 2002 with an inferior wall MI. He initially was treated with bare-metal stenting of the right coronary artery and later underwent drug-eluting stent implantation in the right coronary artery and left circumflex vessels. He presented in July 2013 with class IV anginal symptoms and was taken urgently to the cardiac cath lab. He was found to have critical stenosis in the right coronary artery and was treated with a drug-eluting stent.  Most recent lipid 11/11/2012 showed a cholesterol of 167, triglycerides 93, HDL 54, and LDL 94. LFTs were mildly elevated with an AST of 53 and an ALT of 67.  The patient has been watching his diet very closely. He's been eating a lot of baked fish and salads. He has tried to initiate an exercise program. Unfortunately, he has developed exertional discomfort in his epigastric region and left arm. This is consistently related to exertion within a few minutes of walking. He has to stop and rest and his symptoms resolved within 5 minutes. This is similar to anginal symptoms he's had in the past. Symptoms have been present for 2 months and have slowly progressed. There is mild exertional dyspnea associated with this. There is no diaphoresis, lightheadedness, or syncope. He remains compliant with aspirin and Plavix.  Outpatient Encounter Prescriptions as of 03/25/2013  Medication Sig  . aspirin 81 MG tablet Take 1 tablet (81 mg total) by mouth daily.  Marland Kitchen atorvastatin (LIPITOR) 40 MG tablet TAKE 1 TABLET DAILY  . buPROPion (WELLBUTRIN XL) 150 MG 24 hr tablet Take 300 mg by mouth daily.   . clopidogrel (PLAVIX) 75 MG tablet TAKE 1 TABLET DAILY  . desonide (DESOWEN) 0.05 % lotion Apply 1 application topically daily as needed.  . famotidine (PEPCID) 20 MG tablet Take 20 mg by mouth. Take 1 tab daily, can take BID PRN  . fluticasone (FLONASE) 50  MCG/ACT nasal spray Place 2 sprays into the nose daily.  Marland Kitchen loratadine (CLARITIN) 10 MG tablet Take 10 mg by mouth daily.  . metoprolol succinate (TOPROL-XL) 50 MG 24 hr tablet TAKE 1 TABLET DAILY  . multivitamin (THERAGRAN) per tablet Take 1 tablet by mouth daily.    . nefazodone (SERZONE) 200 MG tablet TAKE ONE AND ONE-HALF DAILY AD S DIRECTED  . nitroGLYCERIN (NITROSTAT) 0.4 MG SL tablet Place 1 tablet (0.4 mg total) under the tongue every 5 (five) minutes as needed.  . prednisoLONE acetate (PRED FORTE) 1 % ophthalmic suspension Place 1 drop into both eyes as needed.    . ramipril (ALTACE) 10 MG capsule TAKE 1 CAPSULE DAILY  . sildenafil (VIAGRA) 100 MG tablet Take 1 tablet (100 mg total) by mouth daily as needed for erectile dysfunction.  . Testosterone (ANDROGEL PUMP) 20.25 MG/ACT (1.62%) GEL USE 2 PUMPS ON SKIN DAILY  . [DISCONTINUED] ANDROGEL PUMP 20.25 MG/ACT (1.62%) GEL USE 4 PUMPS ON SKIN DAILY  . Probiotic Product (PROBIOTIC DAILY PO) Take by mouth. AS DIRECTED    No Known Allergies  Past Medical History  Diagnosis Date  . Hyperlipidemia     mixed  . Spondylitis, ankylosing   . GERD (gastroesophageal reflux disease)   . Depression   . Hypertension   . Coronary artery disease     s/p multiple percutaneous interventions, PCI 202 for DMI and DES distal RCA and CFX-OM 2006  . Myocardial infarction   . Anxiety     ROS:  Negative except as per HPI  BP 138/89  Pulse 73  Ht 6\' 2"  (1.88 m)  Wt 244 lb (110.678 kg)  BMI 31.31 kg/m2  PHYSICAL EXAM: Pt is alert and oriented, NAD HEENT: normal Neck: JVP - normal, carotids 2+= without bruits Lungs: CTA bilaterally CV: RRR without murmur or gallop Abd: soft, NT, Positive BS, no hepatomegaly Ext: no C/C/E, distal pulses intact and equal Skin: warm/dry no rash  EKG:  Normal sinus rhythm 73 beats per minute, within normal limits.  ASSESSMENT AND PLAN: 1. CAD, native vessel. The patient is experiencing new onset CCS class III  anginal symptoms. He's on background therapy with aspirin, Plavix, long-acting metoprolol, and atorvastatin. The last time he had these symptoms, we found critical stenosis of the right coronary artery. I have reviewed options of advancing medical therapy versus immediate invasive evaluation with cardiac catheterization and possible PCI. He prefers the latter and I agree with this approach. I have reviewed the risks, indications, and alternatives to cardiac catheterization and possible PCI. He understands the serious risks such as stroke, heart attack, major vascular injury, and need for emergency CABG are rare. He agrees to proceed.  2. Hyperlipidemia. The patient is maintained on atorvastatin 40 mg daily. Lipids reviewed as above.  3. Hypertension. Blood pressure is controlled on metoprolol succinate and ramipril.  [name] 03/25/2013 10:30 AM

## 2013-03-26 NOTE — Interval H&P Note (Signed)
History and Physical Interval Note:  03/26/2013 9:16 AM  Gregory Hansen  has presented today for surgery, with the diagnosis of angina  The various methods of treatment have been discussed with the patient and family. After consideration of risks, benefits and other options for treatment, the patient has consented to  Procedure(s): LEFT HEART CATHETERIZATION WITH CORONARY ANGIOGRAM (N/A) as a surgical intervention .  The patient's history has been reviewed, patient examined, no change in status, stable for surgery.  I have reviewed the patient's chart and labs.  Questions were answered to the patient's satisfaction.    Cath Lab Visit (complete for each Cath Lab visit)  Clinical Evaluation Leading to the Procedure:   ACS: no  Non-ACS:    Anginal Classification: CCS III  Anti-ischemic medical therapy: Minimal Therapy (1 class of medications)  Non-Invasive Test Results: No non-invasive testing performed  Prior CABG: No previous CABG       [name]

## 2013-03-26 NOTE — CV Procedure (Signed)
Cardiac Catheterization Procedure Note  Name: Gregory Hansen MRN: [id number] DOB: 12/12/1950  Procedure: Left Heart Cath, Selective Coronary Angiography, LV angiography, IVUS, PTCA and stenting of the Ostial/Proximal RCA  Indication: CCS class III angina of recent onset in patient with known CAD.  Procedural Details:  The right wrist was prepped, draped, and anesthetized with 1% lidocaine. Using the modified Seldinger technique, a 5/6 French sheath was introduced into the right radial artery. 3 mg of verapamil was administered through the sheath, weight-based unfractionated heparin was administered intravenously. Standard Judkins catheters were used for selective coronary angiography and left ventriculography. Catheter exchanges were performed over an exchange length guidewire.  PROCEDURAL FINDINGS Hemodynamics: AO 98/63 LV 104/17   Coronary angiography: Coronary dominance: right  Left mainstem: There is mild to moderate left mainstem stenosis estimated at 40-50%. The left main arises from the left coronary cusp and divides into the LAD and left circumflex.  Left anterior descending (LAD): The LAD is moderately calcified. The proximal vessel has 50% stenosis. The first diagonal is small with 50% ostial stenosis. The remaining mid and distal LAD have no evidence of high-grade obstructive disease.  Left circumflex (LCx): The left circumflex is widely patent. The first obtuse marginal is large in caliber. There is 30-40% stenosis where it divides into twin vessels.  Right coronary artery (RCA): The RCA is heavily stented. The long stented segment through the proximal and midportion are patent. In the mid RCA within the stent there is an area of hypodense 30-40% stenosis. The ostium and proximal RCA has severe disease before they stented segment begins. There is irregular 80-90% stenosis of the true ostium and 60-70% stenosis just before the vessel is stented. The distal RCA is patent. The  PDA and PLA branches are patent with diffuse nonobstructive stenosis. There is faint filling of the distal RCA branch from the left coronary system via a septal perforator the LAD.  Left ventriculography: Left ventricular systolic function is normal, LVEF is estimated at 55-65%, there is no significant mitral regurgitation   PCI Note:  Following the diagnostic procedure, the decision was made to proceed with PCI. Intracoronary ultrasound was utilized because the true ostial nature of the lesion made it difficult to assess angiographically. Weight-based heparin was given for anticoagulation. Once a therapeutic ACT was achieved, a 6 [place] JR 5 guide catheter was inserted.  A cougar coronary guidewire was used to cross the lesion.  Nitroglycerin was administered. Intravascular ultrasound was performed via mechanical pullback. This confirmed severe focal stenosis at the ostium of the vessel. The lesion was predilated with a 3.0 mm noncompliant balloon.  The lesion was then stented with a 3.5 x 15 mm Xience Alpine drug-eluting stent.  The stent was postdilated with a 3.75 mm noncompliant balloon.  Following PCI, there was 0% residual stenosis and TIMI-3 flow. Final angiography confirmed an excellent result. Intravascular ultrasound was performed. The patient tolerated the procedure well. There were no immediate procedural complications. A TR band was used for radial hemostasis. The patient was transferred to the post catheterization recovery area for further monitoring.  PCI Data: Vessel - RCA/Segment - proximal/ostial Percent Stenosis (pre)  80 TIMI-flow 3 Stent 3.5 x 15 mm Xience Alpine DES Percent Stenosis (post) 0 TIMI-flow (post) 3  Final Conclusions:   1. Severe ostial RCA stenosis treated successfully with PCI using guidance of intravascular ultrasound  2. Mild to moderate left mainstem stenosis  3. Moderate, stable LAD stenosis  4. Wide patency of the left  circumflex with mild nonobstructive  obtuse marginal disease  5. Preserved LV function  Recommendations:  Continue dual antiplatelet therapy with aspirin and Plavix. Aggressive medical therapy for her residual CAD.  [name] 03/26/2013, 10:47 AM

## 2013-03-26 NOTE — Progress Notes (Signed)
TR BAND REMOVAL  LOCATION:    right radial  DEFLATED PER PROTOCOL:    yes  TIME BAND OFF / DRESSING APPLIED:    1430   SITE UPON ARRIVAL:    Level 0  SITE AFTER BAND REMOVAL:    Level 0  REVERSE ALLEN'S TEST:     positive  CIRCULATION SENSATION AND MOVEMENT:    Within Normal Limits   yes  COMMENTS:   Gauze dressing dry and intact, CSMs wnls, and radial and ulnar pulses +2 when reassessed at 1500

## 2013-03-27 DIAGNOSIS — I2510 Atherosclerotic heart disease of native coronary artery without angina pectoris: Secondary | ICD-10-CM

## 2013-03-27 LAB — CBC
HCT: 39.7 % (ref 39.0–52.0)
Hemoglobin: 13.5 g/dL (ref 13.0–17.0)
MCH: 32.9 pg (ref 26.0–34.0)
MCHC: 34 g/dL (ref 30.0–36.0)
MCV: 96.8 fL (ref 78.0–100.0)
PLATELETS: 178 10*3/uL (ref 150–400)
RBC: 4.1 MIL/uL — ABNORMAL LOW (ref 4.22–5.81)
RDW: 12.6 % (ref 11.5–15.5)
WBC: 5.9 10*3/uL (ref 4.0–10.5)

## 2013-03-27 LAB — BASIC METABOLIC PANEL
BUN: 11 mg/dL (ref 6–23)
CHLORIDE: 101 meq/L (ref 96–112)
CO2: 27 mEq/L (ref 19–32)
Calcium: 8.7 mg/dL (ref 8.4–10.5)
Creatinine, Ser: 0.92 mg/dL (ref 0.50–1.35)
GFR, EST NON AFRICAN AMERICAN: 88 mL/min — AB (ref >90)
Glucose, Bld: 96 mg/dL (ref 70–99)
POTASSIUM: 4.3 meq/L (ref 3.7–5.3)
SODIUM: 139 meq/L (ref 137–147)

## 2013-03-27 MED ORDER — PANTOPRAZOLE SODIUM 20 MG PO TBEC: 20.0000 mg | DELAYED_RELEASE_TABLET | Freq: Two times a day (BID) | ORAL | Status: DC

## 2013-03-27 MED ORDER — ISOSORBIDE MONONITRATE ER 30 MG PO TB24
15.0000 mg | ORAL_TABLET | Freq: Every day | ORAL | Status: DC
Start: 2013-03-27 — End: 2013-04-30

## 2013-03-27 NOTE — Progress Notes (Signed)
Reviewed information with patient and discussed that protonix will be replacing pepcid and he should take the protonix once a day and stop pepcid, verbalized good understanding with teach back.

## 2013-03-27 NOTE — Progress Notes (Signed)
CARDIAC REHAB PHASE I   PRE:  Rate/Rhythm: 70  BP:  Sitting: 122/68     SaO2: 94% RA  MODE:  Ambulation: 500 ft   POST:  Rate/Rhythm: 87  BP:  Sitting: 137/85     SaO2: 99% RA  8:15am-9:12am  Patient walked independently very well! Patient talked the entire time and did not complain of SOB or chest pain.  The patient had been exercising before admission and is eager to get back into it.  The patient declined cardiac rehab because he states he has already been through the program and knows what to do.   [facility], [place] 03/27/2013 9:09 AM

## 2013-03-27 NOTE — Progress Notes (Signed)
Subjective: No complaints.   Objective: Vital signs in last 24 hours: Temp:  [97.4 F (36.3 C)-98.6 F (37 C)] 97.4 F (36.3 C) (01/17 0724) Pulse Rate:  [57-103] 57 (01/17 0724) Resp:  [18-20] 18 (01/17 0724) BP: (104-142)/(50-90) 104/50 mmHg (01/17 0724) SpO2:  [93 %-100 %] 98 % (01/17 0724) Weight:  [240 lb (108.863 kg)] 240 lb (108.863 kg) (01/16 0832) Last BM Date: 03/26/13  Intake/Output from previous day: 01/16 0701 - 01/17 0700 In: 588.9 [P.O.:480; I.V.:108.9] Out: 850 [Urine:850] Intake/Output this shift: Total I/O In: -  Out: 800 [Urine:800]  Medications Current Facility-Administered Medications  Medication Dose Route Frequency Provider Last Rate Last Dose  . 0.9 %  sodium chloride infusion  250 mL Intravenous PRN [name], MD      . acetaminophen (TYLENOL) tablet 650 mg  650 mg Oral Q4H PRN [name], MD      . aspirin EC tablet 81 mg  81 mg Oral Daily [name], MD      . atorvastatin (LIPITOR) tablet 40 mg  40 mg Oral q1800 [name], MD   40 mg at 03/26/13 1800  . buPROPion (WELLBUTRIN XL) 24 hr tablet 300 mg  300 mg Oral Daily [name], MD      . clopidogrel (PLAVIX) tablet 75 mg  75 mg Oral Q breakfast [name], MD      . fluticasone [facility]) 50 MCG/ACT nasal spray 2 spray  2 spray Each Nare Daily [name], MD      . isosorbide mononitrate (IMDUR) 24 hr tablet 15 mg  15 mg Oral Daily [name], MD   15 mg at 03/26/13 1800  . loratadine (CLARITIN) tablet 10 mg  10 mg Oral Daily [name], MD      . metoprolol succinate (TOPROL-XL) 24 hr tablet 50 mg  50 mg Oral Daily [name], MD      . morphine 2 MG/ML injection 2 mg  2 mg Intravenous Q1H PRN [name], MD      . multivitamin with minerals tablet 1 tablet  1 tablet Oral Daily [name], MD      . nefazodone (SERZONE) tablet 300 mg  300 mg Oral QHS [name], MD      . nitroGLYCERIN (NITROSTAT) SL tablet 0.4 mg  0.4 mg Sublingual Q5  min PRN [name], MD      . ondansetron [facility]) injection 4 mg  4 mg Intravenous Q6H PRN [name], MD      . oxyCODONE-acetaminophen (PERCOCET/ROXICET) 5-325 MG per tablet 1-2 tablet  1-2 tablet Oral Q4H PRN [name], MD      . pantoprazole (PROTONIX) EC tablet 20 mg  20 mg Oral BID AC [place], PA-C   20 mg at 03/26/13 1800  . ramipril (ALTACE) capsule 10 mg  10 mg Oral Daily [name], MD      . sodium chloride 0.9 % injection 3 mL  3 mL Intravenous Q12H [name], MD      . sodium chloride 0.9 % injection 3 mL  3 mL Intravenous PRN [name], MD        PE: General appearance: alert, cooperative and no distress Lungs: clear to auscultation bilaterally Heart: regular rate and rhythm, S1, S2 normal, no murmur, click, rub or gallop Extremities: No LEE Pulses: 2+ and symmetric Skin: Right wrist:  no ecchymosis,  Nontender. Neurologic: Grossly normal  Lab Results:   Recent Labs  03/25/13 1041 03/27/13 0435  WBC 6.3 5.9  HGB 16.6 13.5  HCT 48.7 39.7  PLT 235.0 178   BMET  Recent Labs  03/25/13 1041 03/27/13 0435  NA 136 139  K 4.7 4.3  CL 99 101  CO2 30 27  GLUCOSE 96 96  BUN 11 11  CREATININE 1.0 0.92  CALCIUM 10.0 8.7   PT/INR  Recent Labs  03/25/13 1041  LABPROT 10.8  INR 1.0    Assessment/Plan   Active Problems:   HYPERTENSION, BENIGN   CAD (coronary artery disease)   Other and unspecified angina pectoris  Plan:  SP LHC revealing severe ostial RCA stenosis treated successfully with PCI using guidance of intravascular ultrasound. The lesion was then stented with a 3.5 x 15 mm Xience Alpine drug-eluting stent.  Mild to moderate left mainstem stenosis.  Moderate, stable LAD stenosis.  Wide patency of the left circumflex with mild nonobstructive obtuse marginal disease.  Preserved LV function.  P2Y12 88.    ASA, plavix, lipitor, imdur 15, toprol 50, ramipril 10.  BP and HR stable.  DC home today.  Cardiologist: Burt Knack.      LOS: 1 day    HAGER, BRYAN 03/27/2013 7:47 AM  History and all data above reviewed.  Patient examined.  I agree with the findings as above. No pain.  No SOB.  The patient exam reveals COR:RRR  ,  Lungs: Clear  ,  Abd: Positive bowel sounds, no rebound no guarding, Ext No edema, right wrist OK  .  All available labs, radiology testing, previous records reviewed. Agree with documented assessment and plan. OK to discharge.    Jeneen Rinks Caydence Koenig  8:09 AM  03/27/2013

## 2013-03-27 NOTE — Discharge Summary (Signed)
Physician Discharge Summary     Patient ID: Gregory Hansen MRN: [id number] DOB/AGE: December 17, 1950 63 y.o. Cardiologist: Burt Knack PCP: No PCP Per Patient   Admit date: 03/26/2013 Discharge date: 03/27/2013  Admission Diagnoses:   Other and unspecified angina pectoris  Discharge Diagnoses:  Active Problems:   HYPERTENSION, BENIGN   CAD (coronary artery disease)   Other and unspecified angina pectoris   Discharged Condition: stable  Hospital Course:  The patient has coronary artery disease and initially presented in 2002 with an inferior wall MI. He initially was treated with bare-metal stenting of the right coronary artery and later underwent drug-eluting stent implantation in the right coronary artery and left circumflex vessels. He presented in July 2013 with class IV anginal symptoms and was taken urgently to the cardiac cath lab. He was found to have critical stenosis in the right coronary artery and was treated with a drug-eluting stent.  Most recent lipid 11/11/2012 showed a cholesterol of 167, triglycerides 93, HDL 54, and LDL 94. LFTs were mildly elevated with an AST of 53 and an ALT of 67.   The patient has been watching his diet very closely. He's been eating a lot of baked fish and salads. He has tried to initiate an exercise program. Unfortunately, he has developed exertional discomfort in his epigastric region and left arm. This is consistently related to exertion within a few minutes of walking. He has to stop and rest and his symptoms resolved within 5 minutes. This is similar to anginal symptoms he's had in the past. Symptoms have been present for 2 months and have slowly progressed. There is mild exertional dyspnea associated with this. There is no diaphoresis, lightheadedness, or syncope. He remains compliant with aspirin and Plavix.  The patient was brought in for outpatient coronary angiography which revealed severe ostial RCA stenosis treated successfully with PCI using  guidance of intravascular ultrasound. The lesion was then stented with a 3.5 x 15 mm Xience Alpine drug-eluting stent. Mild to moderate left mainstem stenosis. Moderate, stable LAD stenosis. Wide patency of the left circumflex with mild nonobstructive obtuse marginal disease.   He was started on Imdur 15mg .  Augustin Coupe was [facility] at discharge and will be discussed at next office visit.  The patient was seen by Dr. Percival Spanish who felt he was stable for DC home.  Follow up with Dr. Burt Knack or Ext[facility].  Consults: None  Significant Diagnostic Studies:   PROCEDURAL FINDINGS  Hemodynamics:  AO 98/63  LV 104/17  Coronary angiography:  Coronary dominance: right  Left mainstem: There is mild to moderate left mainstem stenosis estimated at 40-50%. The left main arises from the left coronary cusp and divides into the LAD and left circumflex.  Left anterior descending (LAD): The LAD is moderately calcified. The proximal vessel has 50% stenosis. The first diagonal is small with 50% ostial stenosis. The remaining mid and distal LAD have no evidence of high-grade obstructive disease.  Left circumflex (LCx): The left circumflex is widely patent. The first obtuse marginal is large in caliber. There is 30-40% stenosis where it divides into twin vessels.  Right coronary artery (RCA): The RCA is heavily stented. The long stented segment through the proximal and midportion are patent. In the mid RCA within the stent there is an area of hypodense 30-40% stenosis. The ostium and proximal RCA has severe disease before they stented segment begins. There is irregular 80-90% stenosis of the true ostium and 60-70% stenosis just before the vessel is stented. The distal RCA  is patent. The PDA and PLA branches are patent with diffuse nonobstructive stenosis. There is faint filling of the distal RCA branch from the left coronary system via a septal perforator the LAD.  Left ventriculography: Left ventricular systolic function is normal, LVEF  is estimated at 55-65%, there is no significant mitral regurgitation  PCI Note: Following the diagnostic procedure, the decision was made to proceed with PCI. Intracoronary ultrasound was utilized because the true ostial nature of the lesion made it difficult to assess angiographically. Weight-based heparin was given for anticoagulation. Once a therapeutic ACT was achieved, a 6 [place] JR 5 guide catheter was inserted. A cougar coronary guidewire was used to cross the lesion. Nitroglycerin was administered. Intravascular ultrasound was performed via mechanical pullback. This confirmed severe focal stenosis at the ostium of the vessel. The lesion was predilated with a 3.0 mm noncompliant balloon. The lesion was then stented with a 3.5 x 15 mm Xience Alpine drug-eluting stent. The stent was postdilated with a 3.75 mm noncompliant balloon. Following PCI, there was 0% residual stenosis and TIMI-3 flow. Final angiography confirmed an excellent result. Intravascular ultrasound was performed. The patient tolerated the procedure well. There were no immediate procedural complications. A TR band was used for radial hemostasis. The patient was transferred to the post catheterization recovery area for further monitoring.  PCI Data:  Vessel - RCA/Segment - proximal/ostial  Percent Stenosis (pre) 80  TIMI-flow 3  Stent 3.5 x 15 mm Xience Alpine DES  Percent Stenosis (post) 0  TIMI-flow (post) 3  Final Conclusions:  1. Severe ostial RCA stenosis treated successfully with PCI using guidance of intravascular ultrasound  2. Mild to moderate left mainstem stenosis  3. Moderate, stable LAD stenosis  4. Wide patency of the left circumflex with mild nonobstructive obtuse marginal disease  5. Preserved LV function  Recommendations:  Continue dual antiplatelet therapy with aspirin and Plavix. Aggressive medical therapy for her residual CAD.  [name]  03/26/2013, 10:47 AM  Treatments: See above  Discharge  Exam: Blood pressure 104/50, pulse 57, temperature 97.4 F (36.3 C), temperature source Oral, resp. rate 18, height 6\' 2"  (1.88 m), weight 240 lb (108.863 kg), SpO2 98.00%.  Disposition: 01-Home or Self Care      Discharge Orders   Future Appointments Provider Department Dept Phone   04/30/2013 3:45 PM [name], MD [facility] [phone]   Future Orders Complete By Expires   Diet - low sodium heart healthy  As directed    Discharge instructions  As directed    Comments:     No lifting with your right arm for three days.  Review the use of Viagra with Dr. Burt Knack at your next office visit.  Use at the same time with Imdur may be unsafe.   Increase activity slowly  As directed        Medication List    STOP taking these medications       sildenafil 100 MG tablet  Commonly known as:  VIAGRA      TAKE these medications       ANDROGEL PUMP 20.25 MG/ACT (1.62%) Gel  Generic drug:  Testosterone  Place 2 ampules onto the skin daily.     aspirin EC 81 MG tablet  Take 1 tablet (81 mg total) by mouth daily.     atorvastatin 40 MG tablet  Commonly known as:  LIPITOR  Take 40 mg by mouth daily.     buPROPion 150 MG 24 hr tablet  Commonly known as:  WELLBUTRIN XL  Take 300 mg by mouth daily.     clopidogrel 75 MG tablet  Commonly known as:  PLAVIX  Take 75 mg by mouth daily with breakfast.     desonide 0.05 % lotion  Commonly known as:  DESOWEN  Apply 1 application topically daily.     famotidine 20 MG tablet  Commonly known as:  PEPCID  Take 20 mg by mouth 2 (two) times daily. Take 1 tab daily, can take BID PRN     fluticasone 50 MCG/ACT nasal spray  Commonly known as:  FLONASE  Place 2 sprays into the nose daily.     isosorbide mononitrate 30 MG 24 hr tablet  Commonly known as:  IMDUR  Take 0.5 tablets (15 mg total) by mouth daily.     loratadine 10 MG tablet  Commonly known as:  CLARITIN  Take 10 mg by mouth daily.     metoprolol  succinate 50 MG 24 hr tablet  Commonly known as:  TOPROL-XL  Take 50 mg by mouth daily. Take with or immediately following a meal.     multivitamin per tablet  Take 1 tablet by mouth daily.     nefazodone 200 MG tablet  Commonly known as:  SERZONE  Take 300 mg by mouth at bedtime.     nitroGLYCERIN 0.4 MG SL tablet  Commonly known as:  NITROSTAT  Place 0.4 mg under the tongue every 5 (five) minutes as needed for chest pain.     ramipril 10 MG capsule  Commonly known as:  ALTACE  Take 10 mg by mouth daily.       Follow-up Information   Follow up with [name], MD. (The office willcall you with the appt date and time.)    Specialty:  Cardiology   Contact information:   [address] [place] [address] [phone]       Signed: Tarri Fuller 03/27/2013, 9:05 AM  Patient seen and examined.  Plan as discussed in my rounding note for today and outlined above. Minus Breeding  03/27/2013  9:36 AM

## 2013-04-02 ENCOUNTER — Other Ambulatory Visit: Payer: Self-pay | Admitting: *Deleted

## 2013-04-05 ENCOUNTER — Other Ambulatory Visit: Payer: Self-pay | Admitting: *Deleted

## 2013-04-05 MED ORDER — PANTOPRAZOLE SODIUM 20 MG PO TBEC: 20.0000 mg | DELAYED_RELEASE_TABLET | Freq: Two times a day (BID) | ORAL | Status: DC

## 2013-04-08 ENCOUNTER — Ambulatory Visit (INDEPENDENT_AMBULATORY_CARE_PROVIDER_SITE_OTHER): Payer: BC Managed Care – PPO | Admitting: Family Medicine

## 2013-04-08 VITALS — BP 100/61 | HR 102 | Temp 98.0°F | Resp 18 | Ht 73.0 in | Wt 242.0 lb

## 2013-04-08 DIAGNOSIS — L0291 Cutaneous abscess, unspecified: Secondary | ICD-10-CM

## 2013-04-08 DIAGNOSIS — L732 Hidradenitis suppurativa: Secondary | ICD-10-CM

## 2013-04-08 DIAGNOSIS — L0390 Cellulitis, unspecified: Secondary | ICD-10-CM

## 2013-04-08 MED ORDER — DOXYCYCLINE HYCLATE 100 MG PO TABS: 100.0000 mg | ORAL_TABLET | Freq: Two times a day (BID) | ORAL | Status: DC

## 2013-04-08 NOTE — Patient Instructions (Addendum)
Warm compresses over swollen areas 4-5 times per day.  Take antibiotic as instructed.  If any increased size of swollen areas, or other worsening, return for a recheck as we may need to open these areas.  Return to the clinic or go to the nearest emergency room if any of your symptoms worsen or new symptoms occur.  Abscess An abscess is an infected area that contains a collection of pus and debris.It can occur in almost any part of the body. An abscess is also known as a furuncle or boil. CAUSES  An abscess occurs when tissue gets infected. This can occur from blockage of oil or sweat glands, infection of hair follicles, or a minor injury to the skin. As the body tries to fight the infection, pus collects in the area and creates pressure under the skin. This pressure causes pain. People with weakened immune systems have difficulty fighting infections and get certain abscesses more often.  SYMPTOMS Usually an abscess develops on the skin and becomes a painful mass that is red, warm, and tender. If the abscess forms under the skin, you may feel a moveable soft area under the skin. Some abscesses break open (rupture) on their own, but most will continue to get worse without care. The infection can spread deeper into the body and eventually into the bloodstream, causing you to feel ill.  DIAGNOSIS  Your caregiver will take your medical history and perform a physical exam. A sample of fluid may also be taken from the abscess to determine what is causing your infection. TREATMENT  Your caregiver may prescribe antibiotic medicines to fight the infection. However, taking antibiotics alone usually does not cure an abscess. Your caregiver may need to make a small cut (incision) in the abscess to drain the pus. In some cases, gauze is packed into the abscess to reduce pain and to continue draining the area. HOME CARE INSTRUCTIONS   Only take over-the-counter or prescription medicines for pain, discomfort, or  fever as directed by your caregiver.  If you were prescribed antibiotics, take them as directed. Finish them even if you start to feel better.  If gauze is used, follow your caregiver's directions for changing the gauze.  To avoid spreading the infection:  Keep your draining abscess covered with a bandage.  Wash your hands well.  Do not share personal care items, towels, or whirlpools with others.  Avoid skin contact with others.  Keep your skin and clothes clean around the abscess.  Keep all follow-up appointments as directed by your caregiver. SEEK MEDICAL CARE IF:   You have increased pain, swelling, redness, fluid drainage, or bleeding.  You have muscle aches, chills, or a general ill feeling.  You have a fever. MAKE SURE YOU:   Understand these instructions.  Will watch your condition.  Will get help right away if you are not doing well or get worse. Document Released: 12/05/2004 Document Revised: 08/27/2011 Document Reviewed: 05/10/2011 [facility] Patient Information 2014 [place].  Hidradenitis Suppurativa, Sweat Gland Abscess Hidradenitis suppurativa is a long lasting (chronic), uncommon disease of the sweat glands. With this, boil-like lumps and scarring develop in the groin, some times under the arms (axillae), and under the breasts. It may also uncommonly occur behind the ears, in the crease of the buttocks, and around the genitals.  CAUSES  The cause is from a blocking of the sweat glands. They then become infected. It may cause drainage and odor. It is not contagious. So it cannot be given  to someone else. It most often shows up in puberty (about 69 to 64 years of age). But it may happen much later. It is similar to acne which is a disease of the sweat glands. This condition is slightly more common in African-Americans and women. SYMPTOMS   Hidradenitis usually starts as one or more red, tender, swellings in the groin or under the arms (axilla).  Over a  period of hours to days the lesions get larger. They often open to the skin surface, draining clear to yellow-colored fluid.  The infected area heals with scarring. DIAGNOSIS  Your caregiver makes this diagnosis by looking at you. Sometimes cultures (growing germs on plates in the lab) may be taken. This is to see what germ (bacterium) is causing the infection.  TREATMENT   Topical germ killing medicine applied to the skin (antibiotics) are the treatment of choice. Antibiotics taken by mouth (systemic) are sometimes needed when the condition is getting worse or is severe.  Avoid tight-fitting clothing which traps moisture in.  Dirt does not cause hidradenitis and it is not caused by poor hygiene.  Involved areas should be cleaned daily using an antibacterial soap. Some patients find that the liquid form of Lever 2000, applied to the involved areas as a lotion after bathing, can help reduce the odor related to this condition.  Sometimes surgery is needed to drain infected areas or remove scarred tissue. Removal of large amounts of tissue is used only in severe cases.  Birth control pills may be helpful.  Oral retinoids (vitamin A derivatives) for 6 to 12 months which are effective for acne may also help this condition.  Weight loss will improve but not cure hidradenitis. It is made worse by being overweight. But the condition is not caused by being overweight.  This condition is more common in people who have had acne.  It may become worse under stress. There is no medical cure for hidradenitis. It can be controlled, but not cured. The condition usually continues for years with periods of getting worse and getting better (remission). Document Released: 10/10/2003 Document Revised: 05/20/2011 Document Reviewed: 10/26/2007 [facility] Patient Information 2014 [place].

## 2013-04-08 NOTE — Progress Notes (Signed)
Subjective:  .This chart was scribed for Merri Ray, MD by Roxan Diesel, ED scribe.  This patient was seen in [place] [facility] and the patient's care was started at 9:31 AM.   Patient ID: Gregory Hansen, male    DOB: 03/01/1951, 63 y.o.   MRN: [id number]  Chief Complaint  Patient presents with  . Mass    Bumps under (R) armpit x 6 days    HPI  Gregory Hansen is a 63 y.o. male PCP: No PCP Per Patient  Pt presents with multiple bumps under his right armpit that he first noticed 6 days ago.  Pt states the bumps are red and the 2 largest bumps are painful.  Pain worsened by lying in bed.  He denies drainage.  He denies fever.  Pt admits to h/o similar symptoms 2 months ago under both armpits, which resolved on their own in 2 weeks.  He states his current bumps are more painful.  He denies recent changes to deodorant and does not know of any other possible causes of his symptoms.  He uses [employer] deodorant.  He has h/o staph infections but is unsure whether he has had MRSA .  Pt was last in a hospital 19 days ago when he had a stent placed.  He states he has been doing well since then and has been exercising regularly.    Patient Active Problem List   Diagnosis Date Noted  . Other and unspecified angina pectoris 03/26/2013  . Crescendo angina 10/02/2011  . Exertional angina 09/30/2011  . HYPERLIPIDEMIA-MIXED 06/15/2008  . DEPRESSION 06/15/2008  . HYPERTENSION, BENIGN 06/15/2008  . CAD (coronary artery disease) 06/15/2008  . GERD 06/15/2008  . SPONDYLITIS, ANKYLOSING 06/15/2008   Past Medical History  Diagnosis Date  . Hyperlipidemia     mixed  . Spondylitis, ankylosing   . GERD (gastroesophageal reflux disease)   . Depression   . Hypertension   . Coronary artery disease     s/p multiple percutaneous interventions, PCI 202 for DMI and DES distal RCA and CFX-OM 2006  . Myocardial infarction   . Anxiety   . Arthritis     SPINE    Past Surgical History  Procedure  Laterality Date  . Cardiac catheterization      stent RCA June3, 2002, Stent OM/PTCA circumflex August 13, 2000  . Coronary angioplasty with stent placement      first one in 2006; had another place 3 months ago (August 2013)  . Coronary angioplasty with stent placement  03/26/2013    RCA           DR COOPER  . Finger surgery      5  TH     DIGIT RIGHT HAND   No Known Allergies Prior to Admission medications   Medication Sig Start Date End Date Taking? Authorizing Provider  aspirin 81 MG tablet Take 1 tablet (81 mg total) by mouth daily. 10/02/11  Yes Rhonda G Barrett, PA-C  atorvastatin (LIPITOR) 40 MG tablet Take 40 mg by mouth daily.   Yes Historical Provider, MD  buPROPion (WELLBUTRIN XL) 150 MG 24 hr tablet Take 300 mg by mouth daily.    Yes Historical Provider, MD  clopidogrel (PLAVIX) 75 MG tablet Take 75 mg by mouth daily with breakfast.   Yes Historical Provider, MD  desonide (DESOWEN) 0.05 % lotion Apply 1 application topically daily.    Yes Historical Provider, MD  fluticasone (FLONASE) 50 MCG/ACT nasal spray  Place 2 sprays into the nose daily. 05/06/12  Yes Chelle S Jeffery, PA-C  isosorbide mononitrate (IMDUR) 30 MG 24 hr tablet Take 0.5 tablets (15 mg total) by mouth daily. 03/27/13  Yes Tarri Fuller, PA-C  loratadine (CLARITIN) 10 MG tablet Take 10 mg by mouth daily.   Yes Historical Provider, MD  metoprolol succinate (TOPROL-XL) 50 MG 24 hr tablet Take 50 mg by mouth daily. Take with or immediately following a meal.   Yes Historical Provider, MD  multivitamin [facility]) per tablet Take 1 tablet by mouth daily.     Yes Historical Provider, MD  nefazodone (SERZONE) 200 MG tablet Take 300 mg by mouth at bedtime.   Yes Historical Provider, MD  nitroGLYCERIN (NITROSTAT) 0.4 MG SL tablet Place 0.4 mg under the tongue every 5 (five) minutes as needed for chest pain.   Yes Historical Provider, MD  pantoprazole (PROTONIX) 20 MG tablet Take 1 tablet (20 mg total) by mouth 2 (two) times  daily before a meal. 04/05/13  Yes Tarri Fuller, PA-C  ramipril (ALTACE) 10 MG capsule Take 10 mg by mouth daily.   Yes Historical Provider, MD  famotidine (PEPCID) 20 MG tablet Take 20 mg by mouth 2 (two) times daily. Take 1 tab daily, can take BID PRN 10/02/11 03/25/13  Evelene Croon Barrett, PA-C  Testosterone (ANDROGEL PUMP) 20.25 MG/ACT (1.62%) GEL Place 2 ampules onto the skin daily.    Historical Provider, MD   History   Social History  . Marital Status: Single    Spouse Name: n/a    Number of Children: 2  . Years of Education: N/A   Occupational History  . antique [employer]    Social History Main Topics  . Smoking status: Former Smoker    Quit date: 05/06/1982  . Smokeless tobacco: Never Used  . Alcohol Use: 8.4 oz/week    14 Glasses of wine per week     Comment: glass of wine at dinner everyday.  . Drug Use: No  . Sexual Activity: Yes    Birth Control/ Protection: Condom   Other Topics Concern  . Not on file   Social History Narrative   His adult son and daughter live with him, saving money for graduate school.     Review of Systems  Constitutional: Negative for fever.  Skin:       Bumps under right armpit         Objective:   Physical Exam  Nursing note and vitals reviewed. Constitutional: He is oriented to person, place, and time. He appears well-developed and well-nourished. No distress.  HENT:  Head: Normocephalic and atraumatic.  Eyes: EOM are normal.  Neck: Neck supple. No tracheal deviation present.  Cardiovascular: Normal rate.   Pulmonary/Chest: Effort normal. No respiratory distress.  Musculoskeletal: Normal range of motion.  Neurological: He is alert and oriented to person, place, and time.  Skin: Skin is warm and dry.  Multiple erythematous papules on the right axilla.  2 prominent enlarged indurated papules centrally.  More inferior indurated area is approximately 1-1.5 cm, without central fluctuance, no discharge.  The more superior indurate  area is approximately 0.5-1 cm, no central fluctuance, no discharge.  Psychiatric: He has a normal mood and affect. His behavior is normal.     Filed Vitals:   04/08/13 0905  BP: 100/61  Pulse: 102  Temp: 98 F (36.7 C)  TempSrc: Oral  Resp: 18  Height: 6\' 1"  (1.854 m)  Weight: 242 lb (109.77 kg)  SpO2: 98%        Assessment & Plan:   Gregory Hansen is a 63 y.o. male Hidradenitis suppurativa of right axilla - Plan: doxycycline (VIBRA-TABS) 100 MG tablet  Cellulitis - Plan: doxycycline (VIBRA-TABS) 100 MG tablet  Prior self resolution, but current R arm sx's with increased swelling/pain.  Cover with doxycycline as recent hospitalization/increased MRSA risk, but no d/c to obtain cx. Warm compresses, rtc precautions.   Meds ordered this encounter  Medications  . doxycycline (VIBRA-TABS) 100 MG tablet    Sig: Take 1 tablet (100 mg total) by mouth 2 (two) times daily.    Dispense:  20 tablet    Refill:  0   Patient Instructions  Warm compresses over swollen areas 4-5 times per day.  Take antibiotic as instructed.  If any increased size of swollen areas, or other worsening, return for a recheck as we may need to open these areas.  Return to the clinic or go to the nearest emergency room if any of your symptoms worsen or new symptoms occur.  Abscess An abscess is an infected area that contains a collection of pus and debris.It can occur in almost any part of the body. An abscess is also known as a furuncle or boil. CAUSES  An abscess occurs when tissue gets infected. This can occur from blockage of oil or sweat glands, infection of hair follicles, or a minor injury to the skin. As the body tries to fight the infection, pus collects in the area and creates pressure under the skin. This pressure causes pain. People with weakened immune systems have difficulty fighting infections and get certain abscesses more often.  SYMPTOMS Usually an abscess develops on the skin and becomes a  painful mass that is red, warm, and tender. If the abscess forms under the skin, you may feel a moveable soft area under the skin. Some abscesses break open (rupture) on their own, but most will continue to get worse without care. The infection can spread deeper into the body and eventually into the bloodstream, causing you to feel ill.  DIAGNOSIS  Your caregiver will take your medical history and perform a physical exam. A sample of fluid may also be taken from the abscess to determine what is causing your infection. TREATMENT  Your caregiver may prescribe antibiotic medicines to fight the infection. However, taking antibiotics alone usually does not cure an abscess. Your caregiver may need to make a small cut (incision) in the abscess to drain the pus. In some cases, gauze is packed into the abscess to reduce pain and to continue draining the area. HOME CARE INSTRUCTIONS   Only take over-the-counter or prescription medicines for pain, discomfort, or fever as directed by your caregiver.  If you were prescribed antibiotics, take them as directed. Finish them even if you start to feel better.  If gauze is used, follow your caregiver's directions for changing the gauze.  To avoid spreading the infection:  Keep your draining abscess covered with a bandage.  Wash your hands well.  Do not share personal care items, towels, or whirlpools with others.  Avoid skin contact with others.  Keep your skin and clothes clean around the abscess.  Keep all follow-up appointments as directed by your caregiver. SEEK MEDICAL CARE IF:   You have increased pain, swelling, redness, fluid drainage, or bleeding.  You have muscle aches, chills, or a general ill feeling.  You have a fever. MAKE SURE YOU:   Understand these instructions.  Will watch your condition.  Will get help right away if you are not doing well or get worse. Document Released: 12/05/2004 Document Revised: 08/27/2011 Document  Reviewed: 05/10/2011 [facility] Patient Information 2014 [place].  Hidradenitis Suppurativa, Sweat Gland Abscess Hidradenitis suppurativa is a long lasting (chronic), uncommon disease of the sweat glands. With this, boil-like lumps and scarring develop in the groin, some times under the arms (axillae), and under the breasts. It may also uncommonly occur behind the ears, in the crease of the buttocks, and around the genitals.  CAUSES  The cause is from a blocking of the sweat glands. They then become infected. It may cause drainage and odor. It is not contagious. So it cannot be given to someone else. It most often shows up in puberty (about 31 to 64 years of age). But it may happen much later. It is similar to acne which is a disease of the sweat glands. This condition is slightly more common in African-Americans and women. SYMPTOMS   Hidradenitis usually starts as one or more red, tender, swellings in the groin or under the arms (axilla).  Over a period of hours to days the lesions get larger. They often open to the skin surface, draining clear to yellow-colored fluid.  The infected area heals with scarring. DIAGNOSIS  Your caregiver makes this diagnosis by looking at you. Sometimes cultures (growing germs on plates in the lab) may be taken. This is to see what germ (bacterium) is causing the infection.  TREATMENT   Topical germ killing medicine applied to the skin (antibiotics) are the treatment of choice. Antibiotics taken by mouth (systemic) are sometimes needed when the condition is getting worse or is severe.  Avoid tight-fitting clothing which traps moisture in.  Dirt does not cause hidradenitis and it is not caused by poor hygiene.  Involved areas should be cleaned daily using an antibacterial soap. Some patients find that the liquid form of Lever 2000, applied to the involved areas as a lotion after bathing, can help reduce the odor related to this condition.  Sometimes  surgery is needed to drain infected areas or remove scarred tissue. Removal of large amounts of tissue is used only in severe cases.  Birth control pills may be helpful.  Oral retinoids (vitamin A derivatives) for 6 to 12 months which are effective for acne may also help this condition.  Weight loss will improve but not cure hidradenitis. It is made worse by being overweight. But the condition is not caused by being overweight.  This condition is more common in people who have had acne.  It may become worse under stress. There is no medical cure for hidradenitis. It can be controlled, but not cured. The condition usually continues for years with periods of getting worse and getting better (remission). Document Released: 10/10/2003 Document Revised: 05/20/2011 Document Reviewed: 10/26/2007 [facility] Patient Information 2014 [place].

## 2013-04-30 ENCOUNTER — Encounter: Payer: Self-pay | Admitting: Cardiovascular Disease

## 2013-04-30 ENCOUNTER — Ambulatory Visit (INDEPENDENT_AMBULATORY_CARE_PROVIDER_SITE_OTHER): Payer: BC Managed Care – PPO | Admitting: Cardiovascular Disease

## 2013-04-30 VITALS — BP 122/82 | HR 65 | Ht 74.0 in | Wt 246.0 lb

## 2013-04-30 DIAGNOSIS — I2510 Atherosclerotic heart disease of native coronary artery without angina pectoris: Secondary | ICD-10-CM

## 2013-04-30 NOTE — Patient Instructions (Signed)
Your physician wants you to follow-up in:  12 months.  You will receive a reminder letter in the mail two months in advance. If you don't receive a letter, please call our office to schedule the follow-up appointment.   

## 2013-04-30 NOTE — Progress Notes (Signed)
HPI:   63 year old gentleman presenting for followup evaluation. The patient is followed for coronary artery disease. He initially presented in 2002 with an inferior wall MI. He's had multiple PCI procedures since that time. He presented last month with symptoms of unstable angina and was found to have severe stenosis in the ostium of the right coronary artery. He was treated with a drug-eluting stent. He has done very well since that time. He's been physically active with no recurrent symptoms of angina. He discontinued isosorbide on his own because he was feeling so well. He denies dyspnea, orthopnea, PND, or palpitations. He's been compliant with his medications.  Outpatient Encounter Prescriptions as of 04/30/2013  Medication Sig  . aspirin 81 MG tablet Take 1 tablet (81 mg total) by mouth daily.  Marland Kitchen atorvastatin (LIPITOR) 40 MG tablet Take 40 mg by mouth daily.  Marland Kitchen buPROPion (WELLBUTRIN XL) 150 MG 24 hr tablet Take 300 mg by mouth daily.   . clopidogrel (PLAVIX) 75 MG tablet Take 75 mg by mouth daily with breakfast.  . desonide (DESOWEN) 0.05 % lotion Apply 1 application topically daily.   Marland Kitchen loratadine (CLARITIN) 10 MG tablet Take 10 mg by mouth daily.  . metoprolol succinate (TOPROL-XL) 50 MG 24 hr tablet Take 50 mg by mouth daily. Take with or immediately following a meal.  . multivitamin (THERAGRAN) per tablet Take 1 tablet by mouth daily.    . nefazodone (SERZONE) 200 MG tablet Take 300 mg by mouth at bedtime.  . nitroGLYCERIN (NITROSTAT) 0.4 MG SL tablet Place 0.4 mg under the tongue every 5 (five) minutes as needed for chest pain.  . pantoprazole (PROTONIX) 20 MG tablet Take 1 tablet (20 mg total) by mouth 2 (two) times daily before a meal.  . ramipril (ALTACE) 10 MG capsule Take 10 mg by mouth daily.  . sildenafil (VIAGRA) 100 MG tablet Take 100 mg by mouth daily as needed for erectile dysfunction (take 1/2).  . famotidine (PEPCID) 20 MG tablet Take 20 mg by mouth 2 (two) times  daily. Take 1 tab daily, can take BID PRN  . [DISCONTINUED] doxycycline (VIBRA-TABS) 100 MG tablet Take 1 tablet (100 mg total) by mouth 2 (two) times daily.  . [DISCONTINUED] fluticasone (FLONASE) 50 MCG/ACT nasal spray Place 2 sprays into the nose daily.  . [DISCONTINUED] isosorbide mononitrate (IMDUR) 30 MG 24 hr tablet Take 0.5 tablets (15 mg total) by mouth daily.  . [DISCONTINUED] Testosterone (ANDROGEL PUMP) 20.25 MG/ACT (1.62%) GEL Place 2 ampules onto the skin daily.    No Known Allergies  Past Medical History  Diagnosis Date  . Hyperlipidemia     mixed  . Spondylitis, ankylosing   . GERD (gastroesophageal reflux disease)   . Depression   . Hypertension   . Coronary artery disease     s/p multiple percutaneous interventions, PCI 202 for DMI and DES distal RCA and CFX-OM 2006  . Myocardial infarction   . Anxiety   . Arthritis     SPINE     ROS: Negative except as per HPI  BP 122/82  Pulse 65  Ht 6\' 2"  (1.88 m)  Wt 246 lb (111.585 kg)  BMI 31.57 kg/m2  PHYSICAL EXAM: Pt is alert and oriented, NAD HEENT: normal Neck: JVP - normal, carotids 2+= without bruits Lungs: CTA bilaterally CV: RRR without murmur or gallop Abd: soft, NT, Positive BS, no hepatomegaly Ext: no C/C/E, distal pulses intact and equal Skin: warm/dry no rash  EKG:  Normal sinus  rhythm 65 beats per minute, within normal limits  Cardiac catheterization 03/26/2013: Procedure: Left Heart Cath, Selective Coronary Angiography, LV angiography, IVUS, PTCA and stenting of the Ostial/Proximal RCA  Indication: CCS class III angina of recent onset in patient with known CAD.  Procedural Details: The right wrist was prepped, draped, and anesthetized with 1% lidocaine. Using the modified Seldinger technique, a 5/6 French sheath was introduced into the right radial artery. 3 mg of verapamil was administered through the sheath, weight-based unfractionated heparin was administered intravenously. Standard Judkins  catheters were used for selective coronary angiography and left ventriculography. Catheter exchanges were performed over an exchange length guidewire.  PROCEDURAL FINDINGS  Hemodynamics:  AO 98/63  LV 104/17  Coronary angiography:  Coronary dominance: right  Left mainstem: There is mild to moderate left mainstem stenosis estimated at 40-50%. The left main arises from the left coronary cusp and divides into the LAD and left circumflex.  Left anterior descending (LAD): The LAD is moderately calcified. The proximal vessel has 50% stenosis. The first diagonal is small with 50% ostial stenosis. The remaining mid and distal LAD have no evidence of high-grade obstructive disease.  Left circumflex (LCx): The left circumflex is widely patent. The first obtuse marginal is large in caliber. There is 30-40% stenosis where it divides into twin vessels.  Right coronary artery (RCA): The RCA is heavily stented. The long stented segment through the proximal and midportion are patent. In the mid RCA within the stent there is an area of hypodense 30-40% stenosis. The ostium and proximal RCA has severe disease before they stented segment begins. There is irregular 80-90% stenosis of the true ostium and 60-70% stenosis just before the vessel is stented. The distal RCA is patent. The PDA and PLA branches are patent with diffuse nonobstructive stenosis. There is faint filling of the distal RCA branch from the left coronary system via a septal perforator the LAD.  Left ventriculography: Left ventricular systolic function is normal, LVEF is estimated at 55-65%, there is no significant mitral regurgitation  PCI Note: Following the diagnostic procedure, the decision was made to proceed with PCI. Intracoronary ultrasound was utilized because the true ostial nature of the lesion made it difficult to assess angiographically. Weight-based heparin was given for anticoagulation. Once a therapeutic ACT was achieved, a 6 [place] JR 5  guide catheter was inserted. A cougar coronary guidewire was used to cross the lesion. Nitroglycerin was administered. Intravascular ultrasound was performed via mechanical pullback. This confirmed severe focal stenosis at the ostium of the vessel. The lesion was predilated with a 3.0 mm noncompliant balloon. The lesion was then stented with a 3.5 x 15 mm Xience Alpine drug-eluting stent. The stent was postdilated with a 3.75 mm noncompliant balloon. Following PCI, there was 0% residual stenosis and TIMI-3 flow. Final angiography confirmed an excellent result. Intravascular ultrasound was performed. The patient tolerated the procedure well. There were no immediate procedural complications. A TR band was used for radial hemostasis. The patient was transferred to the post catheterization recovery area for further monitoring.  PCI Data:  Vessel - RCA/Segment - proximal/ostial  Percent Stenosis (pre) 80  TIMI-flow 3  Stent 3.5 x 15 mm Xience Alpine DES  Percent Stenosis (post) 0  TIMI-flow (post) 3  Final Conclusions:  1. Severe ostial RCA stenosis treated successfully with PCI using guidance of intravascular ultrasound  2. Mild to moderate left mainstem stenosis  3. Moderate, stable LAD stenosis  4. Wide patency of the left circumflex with mild nonobstructive  obtuse marginal disease  5. Preserved LV function  Recommendations:  Continue dual antiplatelet therapy with aspirin and Plavix. Aggressive medical therapy for her residual CAD.   ASSESSMENT AND PLAN: 1. Coronary artery disease, native vessel. The patient is stable after his recent PCI procedure. He is having no symptoms of angina. He is appropriately treated with a beta blocker, ACE inhibitor, statin drug, aspirin, and Plavix. No medicine changes were made today.  2. Hypertension. Blood pressure is well controlled on a combination of ramipril and metoprolol succinate.  3. Hyperlipidemia. The patient takes atorvastatin 40 mg. Last lipids from  September 2014 showed cholesterol 167, crit) 93, HDL 54, and LDL 94.  For followup I will see him back in one year.  [name] 04/30/2013 6:18 PM

## 2013-05-05 NOTE — Telephone Encounter (Signed)
error 

## 2013-05-10 ENCOUNTER — Other Ambulatory Visit: Payer: Self-pay | Admitting: Cardiovascular Disease

## 2013-08-11 ENCOUNTER — Other Ambulatory Visit: Payer: Self-pay | Admitting: Cardiovascular Disease

## 2013-10-07 ENCOUNTER — Other Ambulatory Visit: Payer: Self-pay | Admitting: Emergency Medicine

## 2013-10-11 ENCOUNTER — Telehealth: Payer: Self-pay | Admitting: Cardiovascular Disease

## 2013-10-11 NOTE — Telephone Encounter (Signed)
New Message  Pt called states that he is having SOB and Tightness in his chest under mild exertion// Sent to triage

## 2013-10-11 NOTE — Telephone Encounter (Signed)
LVM on pts confirmed VM that per Dr Burt Knack, the pt can be seen and worked into his schedule for this Thursday 8/6 for chest tightness and sob.  LVM that someone from our scheduling dept will be calling him very shortly to have this arranged.

## 2013-10-11 NOTE — Telephone Encounter (Signed)
Pt calling to inform Dr Burt Knack and nurse that over the past month he has been experiencing some mid sternal chest tightness and slightly increased sob on exertion only.  Pt states he has not had to use his nitroglycerin at all, because he has not experienced any chest pain.  Pt denies any cardiac complaints at this time.  Pt is fearful that with the tightness and sob on exertion, he is experiencing the same symptoms which put him in the hospital with stents placed earlier in the year.  Pt is not scheduled to see Dr Burt Knack until next year.  Pt would like to see if he could come in for an appt to see Dr Burt Knack to be further evaluated this week. Informed pt that Dr Burt Knack and nurse are both out of the office today.  Informed pt that given he states he's in no acute distress, or has no cardiac complaints at this time, I will forward this message to Dr Burt Knack and nurse and f/u thereafter.  Informed pt that if he starts experiencing any cp, increased sob at rest or on exertion, palpitations, dizziness, syncopal episodes, LEE, or becomes acutely distressed at all, then he should call 911 and inform our office.  Instructed pt to take it easy and avoid prolong exposure to the heat.  Provided education on when and how to take his nitroglycerin for cp.  Pt verbalized understanding and agrees with this plan.

## 2013-10-12 NOTE — Telephone Encounter (Signed)
I spoke with the Gregory Hansen and scheduled him to see Dr Burt Knack on 10/14/13 at 12:30.

## 2013-10-13 ENCOUNTER — Other Ambulatory Visit: Payer: Self-pay | Admitting: Cardiovascular Disease

## 2013-10-14 ENCOUNTER — Encounter: Payer: Self-pay | Admitting: Cardiovascular Disease

## 2013-10-14 ENCOUNTER — Ambulatory Visit (INDEPENDENT_AMBULATORY_CARE_PROVIDER_SITE_OTHER): Payer: BC Managed Care – PPO | Admitting: Cardiovascular Disease

## 2013-10-14 VITALS — BP 130/90 | HR 63 | Ht 74.0 in | Wt 248.0 lb

## 2013-10-14 DIAGNOSIS — I25119 Atherosclerotic heart disease of native coronary artery with unspecified angina pectoris: Secondary | ICD-10-CM

## 2013-10-14 DIAGNOSIS — I2510 Atherosclerotic heart disease of native coronary artery without angina pectoris: Secondary | ICD-10-CM

## 2013-10-14 DIAGNOSIS — I209 Angina pectoris, unspecified: Secondary | ICD-10-CM

## 2013-10-14 DIAGNOSIS — I10 Essential (primary) hypertension: Secondary | ICD-10-CM

## 2013-10-14 MED ORDER — ISOSORBIDE MONONITRATE ER 30 MG PO TB24: 30.0000 mg | ORAL_TABLET | Freq: Every day | ORAL | Status: DC

## 2013-10-14 NOTE — Progress Notes (Signed)
HPI:  63 year old gentleman presenting for followup evaluation. The patient has been followed for coronary artery disease. He initially presented in 2002 with an inferior wall MI. He's had multiple PCI procedures since that time. He presented in January 2015 with symptoms of unstable angina and was found to have severe stenosis in the ostium of the right coronary artery and was treated with a drug-eluting stent. The patient called in with worsening symptoms of chest discomfort and he was added onto our schedule today.  The patient describes return of chest discomfort with activity. When he exercises or walks any significant distance, he develops chest pressure in the center of his chest. This resolves within a few minutes of rest. He has not required nitroglycerin. Symptoms are progressive over the last one month. He also complains of generalized fatigue. He denies lightheadedness or syncope. He has no other complaints. He's been compliant with his medications.   Outpatient Encounter Prescriptions as of 10/14/2013  Medication Sig  . aspirin 81 MG tablet Take 1 tablet (81 mg total) by mouth daily.  Marland Kitchen atorvastatin (LIPITOR) 40 MG tablet Take 40 mg by mouth daily.  Marland Kitchen buPROPion (WELLBUTRIN XL) 150 MG 24 hr tablet Take 300 mg by mouth daily.   . clopidogrel (PLAVIX) 75 MG tablet TAKE 1 TABLET DAILY  . desonide (DESOWEN) 0.05 % lotion Apply 1 application topically daily.   Marland Kitchen loratadine (CLARITIN) 10 MG tablet Take 10 mg by mouth daily.  . metoprolol succinate (TOPROL-XL) 50 MG 24 hr tablet TAKE 1 TABLET DAILY  . multivitamin (THERAGRAN) per tablet Take 1 tablet by mouth daily.    . nefazodone (SERZONE) 200 MG tablet Take 300 mg by mouth at bedtime.  Marland Kitchen NITROSTAT 0.4 MG SL tablet PLACE 1 TABLET (0.4 MG TOTAL) UNDER THE TONGUE EVERY 5 (FIVE) MINUTES AS NEEDED.  Marland Kitchen pantoprazole (PROTONIX) 20 MG tablet Take 1 tablet (20 mg total) by mouth 2 (two) times daily before a meal.  . ramipril (ALTACE) 10 MG  capsule Take 10 mg by mouth daily.  . [DISCONTINUED] sildenafil (VIAGRA) 100 MG tablet Take 100 mg by mouth daily as needed for erectile dysfunction (take 1/2).  Marland Kitchen isosorbide mononitrate (IMDUR) 30 MG 24 hr tablet Take 1 tablet (30 mg total) by mouth daily.  . [DISCONTINUED] clopidogrel (PLAVIX) 75 MG tablet Take 75 mg by mouth daily with breakfast.  . [DISCONTINUED] famotidine (PEPCID) 20 MG tablet Take 20 mg by mouth 2 (two) times daily. Take 1 tab daily, can take BID PRN  . [DISCONTINUED] metoprolol succinate (TOPROL-XL) 50 MG 24 hr tablet Take 50 mg by mouth daily. Take with or immediately following a meal.  . [DISCONTINUED] nitroGLYCERIN (NITROSTAT) 0.4 MG SL tablet Place 0.4 mg under the tongue every 5 (five) minutes as needed for chest pain.    No Known Allergies  Past Medical History  Diagnosis Date  . Hyperlipidemia     mixed  . Spondylitis, ankylosing   . GERD (gastroesophageal reflux disease)   . Depression   . Hypertension   . Coronary artery disease     s/p multiple percutaneous interventions, PCI 202 for DMI and DES distal RCA and CFX-OM 2006  . Myocardial infarction   . Anxiety   . Arthritis     SPINE     ROS: Negative except as per HPI  BP 130/90  Pulse 63  Ht 6\' 2"  (1.88 m)  Wt 248 lb (112.492 kg)  BMI 31.83 kg/m2  PHYSICAL EXAM: Pt is alert  and oriented, pleasant obese male in NAD HEENT: normal Neck: JVP - normal, carotids 2+= without bruits Lungs: CTA bilaterally CV: RRR without murmur or gallop Abd: soft, NT, Positive BS, no hepatomegaly Ext: no C/C/E, distal pulses intact and equal Skin: warm/dry no rash  EKG:  Normal sinus rhythm 63 beats per minute, possible age-indeterminate inferior MI, otherwise within normal limits.  ASSESSMENT AND PLAN: 1. Coronary artery disease, native vessel. The patient has exertional angina, CCS class III. I reviewed his previous cardiac catheterization study and he has mild to moderate left mainstem disease. It is  possible that he has in-stent restenosis as he has undergone multiple PCI procedures involving the RCA. I have recommended that we add isosorbide 15 mg daily for one week then 30 mg daily. He understands that he cannot take medicine for erectile dysfunction while he is on long-acting nitrates. Will also check an exercise Myoview scan to further risk stratify him and try to localize ischemia. Further followup pending the results of the stress test. He may ultimately require repeat cardiac catheterization. Other medications will be continued without changes.  2. Hypertension. Blood pressure is well controlled on a combination of ramipril and metoprolol succinate. Will add a long-acting nitrate as above.  3. Hyperlipidemia. The patient takes atorvastatin 40 mg. Last lipids from September 2014 showed cholesterol 167, crit) 93, HDL 54, and LDL 94.  [name] 10/14/2013 2:27 PM

## 2013-10-14 NOTE — Patient Instructions (Signed)
Your physician has requested that you have an exercise stress myoview. For further information please visit [email or web address]. Please follow instruction sheet, as given.  Your physician has recommended you make the following change in your medication: START Isosorbide MN 30mg  take one tablet by mouth daily, please take one-half tablet by mouth daily for the first week and then increase to whole tablet

## 2013-10-18 ENCOUNTER — Encounter: Payer: Self-pay | Admitting: Cardiology

## 2013-10-19 ENCOUNTER — Encounter (HOSPITAL_COMMUNITY): Payer: Self-pay | Admitting: Pharmacy Technician

## 2013-10-19 ENCOUNTER — Ambulatory Visit (HOSPITAL_COMMUNITY): Payer: BC Managed Care – PPO | Attending: Cardiology | Admitting: Radiology

## 2013-10-19 ENCOUNTER — Other Ambulatory Visit: Payer: Self-pay | Admitting: *Deleted

## 2013-10-19 VITALS — BP 165/95 | Ht 74.0 in | Wt 245.0 lb

## 2013-10-19 DIAGNOSIS — I209 Angina pectoris, unspecified: Secondary | ICD-10-CM | POA: Insufficient documentation

## 2013-10-19 DIAGNOSIS — R079 Chest pain, unspecified: Secondary | ICD-10-CM

## 2013-10-19 DIAGNOSIS — I10 Essential (primary) hypertension: Secondary | ICD-10-CM | POA: Insufficient documentation

## 2013-10-19 DIAGNOSIS — I25119 Atherosclerotic heart disease of native coronary artery with unspecified angina pectoris: Secondary | ICD-10-CM

## 2013-10-19 DIAGNOSIS — I2510 Atherosclerotic heart disease of native coronary artery without angina pectoris: Secondary | ICD-10-CM | POA: Insufficient documentation

## 2013-10-19 MED ORDER — REGADENOSON 0.4 MG/5ML IV SOLN
0.4000 mg | Freq: Once | INTRAVENOUS | Status: AC
  Administered 2013-10-19: 0.4 mg via INTRAVENOUS

## 2013-10-19 MED ORDER — TECHNETIUM TC 99M SESTAMIBI GENERIC - CARDIOLITE
33.0000 | Freq: Once | INTRAVENOUS | Status: AC | PRN
  Administered 2013-10-19: 33 via INTRAVENOUS

## 2013-10-19 MED ORDER — ISOSORBIDE MONONITRATE ER 30 MG PO TB24: 30.0000 mg | ORAL_TABLET | Freq: Every day | ORAL | Status: DC

## 2013-10-19 MED ORDER — TECHNETIUM TC 99M SESTAMIBI GENERIC - CARDIOLITE
11.0000 | Freq: Once | INTRAVENOUS | Status: AC | PRN
  Administered 2013-10-19: 11 via INTRAVENOUS

## 2013-10-19 NOTE — Progress Notes (Signed)
[facility] [address] [place], [place] [address] [phone]    Cardiology Nuclear Med Study  Gregory Hansen is a 63 y.o. male     MRN : [id number]     DOB: [birth year]  Procedure Date: 10/19/2013  Nuclear Med Background Indication for Stress Test:  Evaluation for Ischemia and Stent Patency History:  CAD-MI-CATH-STENT/PTCA:RCA "11 MPI: EF: 63% NL Cardiac Risk Factors: Family History - CAD, History of Smoking, Hypertension and Lipids  Symptoms:  Chest Pain, Chest Tightness (last date of chest discomfort 3-4 days ago) and Palpitations   Nuclear Pre-Procedure Caffeine/Decaff Intake:  None NPO After: 10:00pm   Lungs:  clear O2 Sat: 98% on room air. IV 0.9% NS with Angio Cath:  20g  IV Site: R Wrist  IV Started by:  Matilde Haymaker, RN  Chest Size (in):  46 Cup Size: n/a  Height: 6\' 2"  (1.88 m)  Weight:  245 lb (111.131 kg)  BMI:  Body mass index is 31.44 kg/(m^2). Tech Comments:  No Toprol x 24 hrs. The stress test shown to [name]. Pt set up to be cathed on 10/25/13 by M.Cooper MD the patient's Cardiologist.    Nuclear Med Study 1 or 2 day study: 1 day  Stress Test Type:  Treadmill/Lexiscan  Reading MD: n/a  Order Authorizing Provider:  Legrand Como Cooper,MD  Resting Radionuclide: Technetium 79m Sestamibi  Resting Radionuclide Dose: 11.0 mCi   Stress Radionuclide:  Technetium 23m Sestamibi  Stress Radionuclide Dose: 33.0 mCi           Stress Protocol Rest HR: 59 Stress HR: 107  Rest BP: 165/95 Stress BP: 176/89  Exercise Time (min): 7:15 METS: 7.0   Predicted Max HR: 156 bpm % Max HR: 68.59 bpm Rate Pressure Product: 18832   Dose of Adenosine (mg):  n/a Dose of Lexiscan: 0.4 mg  Dose of Atropine (mg): n/a Dose of Dobutamine: n/a mcg/kg/min (at max HR)  Stress Test Technologist: Ileene Hutchinson, EMT-P  Nuclear Technologist:  Vedia Pereyra, CNMT     Rest Procedure:  Myocardial perfusion imaging was performed at rest 45 minutes  following the intravenous administration of Technetium 60m Sestamibi. Rest ECG: NSR - Normal EKG  Stress Procedure:  The patient exercised on the treadmill utilizing the Bruce Protocol for 7:15 minutes. The patient stopped due to sob, fatigue, unable to keep up with the treadmill, and denied any chest tightness.  Technetium 34m Sestamibi was injected at peak exercise and myocardial perfusion imaging was performed after a brief delay. Stress ECG: 2 mm horizontal ST depression in the inferior leads and V4-6.  Some ECG change lasted to 9 minutes in recovery.  QPS Raw Data Images:  Normal; no motion artifact; normal heart/lung ratio. Stress Images:  Large, moderate-intensity perfusion defect involving most of the anterior wall as well as the apical septal and apical lateral segments.   Rest Images:  Normal homogeneous uptake in all areas of the myocardium. Subtraction (SDS):  Reversible, large, moderate-intensity perfusion defect involving most of the anterior wall as well as the apical septal and apical lateral segments.  Transient Ischemic Dilatation (Normal <1.22):  1.38 Lung/Heart Ratio (Normal <0.45):  0.34  Quantitative Gated Spect Images QGS EDV:  123 ml QGS ESV:  59 ml  Impression Exercise Capacity:  Average, converted to [place].  BP Response:  Normal blood pressure response. Clinical Symptoms:  Chest tightness and dyspnea. ECG Impression:  2 mm horizontal ST depression in the inferior leads and V4-6.  Some ECG change lasted to 9 minutes in recovery.  Comparison with Prior Nuclear Study: No images to compare  Overall Impression:  High risk stress nuclear study with a fully reversible anterior and apical septal/apical lateral perfusion defect.  There were significant ECG changes and chest pain. .  LV Ejection Fraction: 52%.  LV Wall Motion:  apical hypokinesis.  Gregory Hansen 10/19/2013

## 2013-10-20 ENCOUNTER — Telehealth: Payer: Self-pay | Admitting: Physician Assistant

## 2013-10-20 NOTE — Telephone Encounter (Signed)
Follow Up     Faxed over a request for prior authorization for pantoprazole (PROTONIX) 20 MG tablet on 8/10. Calling to follow up on status. Please call.

## 2013-10-21 NOTE — Telephone Encounter (Signed)
Returned call to [employer] with [employer] closed at 5:30 pm.Message sent to Dr.Cooper's nurse Theodosia Quay RN.

## 2013-10-25 ENCOUNTER — Other Ambulatory Visit: Payer: Self-pay

## 2013-10-25 ENCOUNTER — Inpatient Hospital Stay (HOSPITAL_COMMUNITY)
Admission: RE | Admit: 2013-10-25 | Discharge: 2013-11-02 | DRG: 234 | Disposition: A | Discharge status: Home / Self Care | Payer: BC Managed Care – PPO | Source: Ambulatory Visit | Attending: Surgery | Admitting: Surgery

## 2013-10-25 ENCOUNTER — Other Ambulatory Visit: Payer: Self-pay | Admitting: Cardiovascular Disease

## 2013-10-25 ENCOUNTER — Encounter (HOSPITAL_COMMUNITY): Payer: Self-pay | Admitting: *Deleted

## 2013-10-25 ENCOUNTER — Encounter (HOSPITAL_COMMUNITY)
Admission: RE | Disposition: A | Discharge status: Home / Self Care | Payer: Self-pay | Source: Ambulatory Visit | Attending: Surgery

## 2013-10-25 DIAGNOSIS — Z9861 Coronary angioplasty status: Secondary | ICD-10-CM

## 2013-10-25 DIAGNOSIS — E8779 Other fluid overload: Secondary | ICD-10-CM | POA: Diagnosis not present

## 2013-10-25 DIAGNOSIS — D62 Acute posthemorrhagic anemia: Secondary | ICD-10-CM | POA: Diagnosis not present

## 2013-10-25 DIAGNOSIS — K219 Gastro-esophageal reflux disease without esophagitis: Secondary | ICD-10-CM | POA: Diagnosis present

## 2013-10-25 DIAGNOSIS — I2510 Atherosclerotic heart disease of native coronary artery without angina pectoris: Secondary | ICD-10-CM | POA: Diagnosis present

## 2013-10-25 DIAGNOSIS — I209 Angina pectoris, unspecified: Secondary | ICD-10-CM

## 2013-10-25 DIAGNOSIS — E782 Mixed hyperlipidemia: Secondary | ICD-10-CM | POA: Diagnosis present

## 2013-10-25 DIAGNOSIS — F411 Generalized anxiety disorder: Secondary | ICD-10-CM | POA: Diagnosis present

## 2013-10-25 DIAGNOSIS — T82897A Other specified complication of cardiac prosthetic devices, implants and grafts, initial encounter: Secondary | ICD-10-CM | POA: Diagnosis present

## 2013-10-25 DIAGNOSIS — F329 Major depressive disorder, single episode, unspecified: Secondary | ICD-10-CM | POA: Diagnosis present

## 2013-10-25 DIAGNOSIS — Y831 Surgical operation with implant of artificial internal device as the cause of abnormal reaction of the patient, or of later complication, without mention of misadventure at the time of the procedure: Secondary | ICD-10-CM | POA: Diagnosis present

## 2013-10-25 DIAGNOSIS — I10 Essential (primary) hypertension: Secondary | ICD-10-CM | POA: Diagnosis present

## 2013-10-25 DIAGNOSIS — Z7982 Long term (current) use of aspirin: Secondary | ICD-10-CM | POA: Diagnosis not present

## 2013-10-25 DIAGNOSIS — I25110 Atherosclerotic heart disease of native coronary artery with unstable angina pectoris: Secondary | ICD-10-CM

## 2013-10-25 DIAGNOSIS — Z7901 Long term (current) use of anticoagulants: Secondary | ICD-10-CM | POA: Diagnosis not present

## 2013-10-25 DIAGNOSIS — Z951 Presence of aortocoronary bypass graft: Secondary | ICD-10-CM

## 2013-10-25 DIAGNOSIS — Z87891 Personal history of nicotine dependence: Secondary | ICD-10-CM | POA: Diagnosis not present

## 2013-10-25 DIAGNOSIS — I252 Old myocardial infarction: Secondary | ICD-10-CM

## 2013-10-25 DIAGNOSIS — E785 Hyperlipidemia, unspecified: Secondary | ICD-10-CM

## 2013-10-25 DIAGNOSIS — F3289 Other specified depressive episodes: Secondary | ICD-10-CM | POA: Diagnosis present

## 2013-10-25 DIAGNOSIS — I200 Unstable angina: Secondary | ICD-10-CM | POA: Diagnosis present

## 2013-10-25 DIAGNOSIS — Z8249 Family history of ischemic heart disease and other diseases of the circulatory system: Secondary | ICD-10-CM | POA: Diagnosis not present

## 2013-10-25 HISTORY — PX: LEFT HEART CATHETERIZATION WITH CORONARY ANGIOGRAM: SHX5451

## 2013-10-25 HISTORY — DX: Angina pectoris, unspecified: I20.9

## 2013-10-25 LAB — CBC
HEMATOCRIT: 42.8 % (ref 39.0–52.0)
Hemoglobin: 14.9 g/dL (ref 13.0–17.0)
MCH: 33.9 pg (ref 26.0–34.0)
MCHC: 34.8 g/dL (ref 30.0–36.0)
MCV: 97.3 fL (ref 78.0–100.0)
Platelets: 185 10*3/uL (ref 150–400)
RBC: 4.4 MIL/uL (ref 4.22–5.81)
RDW: 12.8 % (ref 11.5–15.5)
WBC: 5 10*3/uL (ref 4.0–10.5)

## 2013-10-25 LAB — SURGICAL PCR SCREEN
MRSA, PCR: POSITIVE — AB
Staphylococcus aureus: POSITIVE — AB

## 2013-10-25 LAB — BASIC METABOLIC PANEL
Anion gap: 16 — ABNORMAL HIGH (ref 5–15)
BUN: 7 mg/dL (ref 6–23)
CO2: 19 meq/L (ref 19–32)
CREATININE: 0.84 mg/dL (ref 0.50–1.35)
Calcium: 9.2 mg/dL (ref 8.4–10.5)
Chloride: 103 mEq/L (ref 96–112)
GFR calc non Af Amer: >90 mL/min (ref >90)
Glucose, Bld: 80 mg/dL (ref 70–99)
POTASSIUM: 4.3 meq/L (ref 3.7–5.3)
Sodium: 138 mEq/L (ref 137–147)

## 2013-10-25 LAB — PROTIME-INR
INR: 1.11 (ref 0.00–1.49)
PROTHROMBIN TIME: 14.3 s (ref 11.6–15.2)

## 2013-10-25 LAB — PLATELET INHIBITION P2Y12: PLATELET FUNCTION P2Y12: 114 [PRU] — AB (ref 194–418)

## 2013-10-25 SURGERY — LEFT HEART CATHETERIZATION WITH CORONARY ANGIOGRAM
Anesthesia: LOCAL

## 2013-10-25 MED ORDER — MORPHINE SULFATE 2 MG/ML IJ SOLN: 2.0000 mg | INTRAMUSCULAR | Status: DC | PRN

## 2013-10-25 MED ORDER — ATORVASTATIN CALCIUM 40 MG PO TABS
40.0000 mg | ORAL_TABLET | Freq: Every day | ORAL | Status: DC
  Filled 2013-10-25: qty 1

## 2013-10-25 MED ORDER — ATORVASTATIN CALCIUM 40 MG PO TABS
40.0000 mg | ORAL_TABLET | Freq: Every day | ORAL | Status: DC
  Administered 2013-10-25: 40 mg via ORAL
  Filled 2013-10-25 (×2): qty 1

## 2013-10-25 MED ORDER — VERAPAMIL HCL 2.5 MG/ML IV SOLN
INTRAVENOUS | Status: AC
  Filled 2013-10-25: qty 2

## 2013-10-25 MED ORDER — ACETAMINOPHEN 325 MG PO TABS: 650.0000 mg | ORAL_TABLET | ORAL | Status: DC | PRN

## 2013-10-25 MED ORDER — CHLORHEXIDINE GLUCONATE CLOTH 2 % EX PADS
6.0000 | MEDICATED_PAD | Freq: Every day | CUTANEOUS | Status: DC
  Administered 2013-10-26: 6 via TOPICAL

## 2013-10-25 MED ORDER — PANTOPRAZOLE SODIUM 20 MG PO TBEC
20.0000 mg | DELAYED_RELEASE_TABLET | Freq: Two times a day (BID) | ORAL | Status: DC
  Administered 2013-10-25 – 2013-10-28 (×7): 20 mg via ORAL
  Filled 2013-10-25 (×9): qty 1

## 2013-10-25 MED ORDER — HEPARIN (PORCINE) IN NACL 100-0.45 UNIT/ML-% IJ SOLN
1650.0000 [IU]/h | INTRAMUSCULAR | Status: DC
  Administered 2013-10-25: 1400 [IU]/h via INTRAVENOUS
  Administered 2013-10-26: 1700 [IU]/h via INTRAVENOUS
  Administered 2013-10-26: 1650 [IU]/h via INTRAVENOUS
  Filled 2013-10-25 (×6): qty 250

## 2013-10-25 MED ORDER — ASPIRIN 81 MG PO CHEW
CHEWABLE_TABLET | ORAL | Status: AC
  Filled 2013-10-25: qty 1

## 2013-10-25 MED ORDER — FENTANYL CITRATE 0.05 MG/ML IJ SOLN
INTRAMUSCULAR | Status: AC
  Filled 2013-10-25: qty 2

## 2013-10-25 MED ORDER — METOPROLOL SUCCINATE ER 50 MG PO TB24
50.0000 mg | ORAL_TABLET | Freq: Every day | ORAL | Status: DC
  Administered 2013-10-26 – 2013-10-28 (×3): 50 mg via ORAL
  Filled 2013-10-25 (×4): qty 1

## 2013-10-25 MED ORDER — MUPIROCIN 2 % EX OINT
1.0000 "application " | TOPICAL_OINTMENT | Freq: Two times a day (BID) | CUTANEOUS | Status: DC
  Administered 2013-10-25 – 2013-10-28 (×7): 1 via NASAL
  Filled 2013-10-25: qty 22

## 2013-10-25 MED ORDER — SODIUM CHLORIDE 0.9 % IJ SOLN: 3.0000 mL | Freq: Two times a day (BID) | INTRAMUSCULAR | Status: DC

## 2013-10-25 MED ORDER — SODIUM CHLORIDE 0.9 % IJ SOLN: 3.0000 mL | INTRAMUSCULAR | Status: DC | PRN

## 2013-10-25 MED ORDER — RAMIPRIL 10 MG PO CAPS
10.0000 mg | ORAL_CAPSULE | Freq: Every day | ORAL | Status: DC
  Administered 2013-10-26: 10 mg via ORAL
  Filled 2013-10-25: qty 1

## 2013-10-25 MED ORDER — MIDAZOLAM HCL 2 MG/2ML IJ SOLN
INTRAMUSCULAR | Status: AC
  Filled 2013-10-25: qty 2

## 2013-10-25 MED ORDER — HEPARIN SODIUM (PORCINE) 1000 UNIT/ML IJ SOLN
INTRAMUSCULAR | Status: AC
  Filled 2013-10-25: qty 1

## 2013-10-25 MED ORDER — ASPIRIN 81 MG PO CHEW
81.0000 mg | CHEWABLE_TABLET | ORAL | Status: AC
  Administered 2013-10-25: 81 mg via ORAL

## 2013-10-25 MED ORDER — ASPIRIN EC 81 MG PO TBEC
81.0000 mg | DELAYED_RELEASE_TABLET | Freq: Every day | ORAL | Status: DC
  Administered 2013-10-25: 81 mg via ORAL

## 2013-10-25 MED ORDER — SODIUM CHLORIDE 0.9 % IV SOLN
INTRAVENOUS | Status: DC
  Administered 2013-10-25: 09:00:00 via INTRAVENOUS

## 2013-10-25 MED ORDER — ISOSORBIDE MONONITRATE ER 30 MG PO TB24
30.0000 mg | ORAL_TABLET | Freq: Every day | ORAL | Status: DC
  Administered 2013-10-26 – 2013-10-27 (×2): 30 mg via ORAL
  Filled 2013-10-25 (×2): qty 1

## 2013-10-25 MED ORDER — SODIUM CHLORIDE 0.9 % IV SOLN
1.0000 mL/kg/h | INTRAVENOUS | Status: AC
  Administered 2013-10-25: 1 mL/kg/h via INTRAVENOUS

## 2013-10-25 MED ORDER — SODIUM CHLORIDE 0.9 % IV SOLN
250.0000 mL | INTRAVENOUS | Status: DC | PRN
  Administered 2013-10-29: 12:00:00 via INTRAVENOUS

## 2013-10-25 MED ORDER — ONDANSETRON HCL 4 MG/2ML IJ SOLN: 4.0000 mg | Freq: Four times a day (QID) | INTRAMUSCULAR | Status: DC | PRN

## 2013-10-25 MED ORDER — ASPIRIN EC 81 MG PO TBEC
81.0000 mg | DELAYED_RELEASE_TABLET | Freq: Every day | ORAL | Status: DC
  Administered 2013-10-26 – 2013-10-28 (×3): 81 mg via ORAL
  Filled 2013-10-25 (×4): qty 1

## 2013-10-25 MED ORDER — LORATADINE 10 MG PO TABS
10.0000 mg | ORAL_TABLET | Freq: Every day | ORAL | Status: DC
  Administered 2013-10-26 – 2013-10-28 (×3): 10 mg via ORAL
  Filled 2013-10-25 (×4): qty 1

## 2013-10-25 MED ORDER — NEFAZODONE HCL 150 MG PO TABS
300.0000 mg | ORAL_TABLET | Freq: Every day | ORAL | Status: DC
  Administered 2013-10-25 – 2013-11-01 (×8): 300 mg via ORAL
  Filled 2013-10-25 (×10): qty 2

## 2013-10-25 MED ORDER — BUPROPION HCL ER (XL) 300 MG PO TB24
300.0000 mg | ORAL_TABLET | Freq: Every day | ORAL | Status: DC
  Administered 2013-10-26 – 2013-11-02 (×7): 300 mg via ORAL
  Filled 2013-10-25 (×8): qty 1

## 2013-10-25 MED ORDER — OXYCODONE-ACETAMINOPHEN 5-325 MG PO TABS: 1.0000 | ORAL_TABLET | ORAL | Status: DC | PRN

## 2013-10-25 MED ORDER — LORATADINE 10 MG PO TABS
10.0000 mg | ORAL_TABLET | Freq: Every day | ORAL | Status: DC
  Filled 2013-10-25: qty 1

## 2013-10-25 MED ORDER — SODIUM CHLORIDE 0.9 % IJ SOLN
3.0000 mL | Freq: Two times a day (BID) | INTRAMUSCULAR | Status: DC
  Administered 2013-10-25 – 2013-10-28 (×7): 3 mL via INTRAVENOUS

## 2013-10-25 MED ORDER — PANTOPRAZOLE SODIUM 20 MG PO TBEC
20.0000 mg | DELAYED_RELEASE_TABLET | Freq: Two times a day (BID) | ORAL | Status: DC
Start: 2013-10-25 — End: 2013-10-25
  Filled 2013-10-25 (×2): qty 1

## 2013-10-25 MED ORDER — SODIUM CHLORIDE 0.9 % IV SOLN: 250.0000 mL | INTRAVENOUS | Status: DC | PRN

## 2013-10-25 MED ORDER — DIAZEPAM 5 MG PO TABS: 5.0000 mg | ORAL_TABLET | ORAL | Status: DC | PRN

## 2013-10-25 NOTE — Consult Note (Signed)
[address]       [place] [address]             [phone]      Cardiothoracic Surgery Consultation    Reason for Consult: High grade left main and multivessel coronary disease Referring Physician: Dr. [name]  Gregory Hansen is an 63 y.o. male.  HPI:   The patient is a 63 year old gentleman with a history of hyperlipidemia, hypertension and a strong family history of heart disease who has undergone multiple stenting procedures for coronary disease in the past. He suffered an inferior MI in 2002 and underwent initial stenting of the RCA at that time. He has had multiple PCI's since then with the last one in January 2015 for severe stenosis in the ostium of the RCA treated with a DES. He now reports progressive exertional chest discomfort over the past few months occuring with walking any distance, riding his bike or mowing the lawn. He has had no symptoms at rest. Cardiac cath today shows 90-95% proximal LM stenosis. The LAD has proximal 75% stenosis and 50-60% mid vessel stenosis. The LCX is widely patent. The RCA has 80-90% proximal stenosis within the ostial and proximal stented segment. LVEF is 55-65%. He has been on Plavix chronically with a P2Y12 level this am of 114 showing significant platelet inhibition.  Past Medical History  Diagnosis Date  . Hyperlipidemia     mixed  . Spondylitis, ankylosing   . GERD (gastroesophageal reflux disease)   . Depression   . Hypertension   . Coronary artery disease     s/p multiple percutaneous interventions, PCI 202 for DMI and DES distal RCA and CFX-OM 2006  . Myocardial infarction   . Anxiety   . Arthritis     SPINE   . Anginal pain     Past Surgical History  Procedure Laterality Date  . Cardiac catheterization      stent RCA June3, 2002, Stent OM/PTCA circumflex August 13, 2000  . Coronary angioplasty with stent placement      first one in 2006; had another place 3 months ago (August 2013)  .  Coronary angioplasty with stent placement  03/26/2013    RCA           DR COOPER  . Finger surgery      5  TH     DIGIT RIGHT HAND    Family History  Problem Relation Age of Onset  . Heart disease      positive for cardiac disease and both brothers have had cardiac events  . Breast cancer Mother   . Heart disease Father   . Heart disease Brother   . Leukemia Maternal Grandmother   . Heart disease Maternal Grandfather     Social History:  reports that he quit smoking about 31 years ago. He has never used smokeless tobacco. He reports that he drinks about 8.4 ounces of alcohol per week. He reports that he does not use illicit drugs.  Allergies: No Known Allergies  Medications:  I have reviewed the patient's current medications. Prior to Admission:  Prescriptions prior to admission  Medication Sig Dispense Refill  . aspirin 81 MG tablet Take 1 tablet (81 mg total) by mouth daily.      Marland Kitchen atorvastatin (LIPITOR) 40 MG tablet Take 40 mg by mouth daily.      Marland Kitchen buPROPion (WELLBUTRIN XL) 300 MG 24 hr tablet Take 300 mg by  mouth daily.      . cetirizine (ZYRTEC) 10 MG tablet Take 10 mg by mouth daily.      . clopidogrel (PLAVIX) 75 MG tablet Take 75 mg by mouth daily.      Marland Kitchen desonide (DESOWEN) 0.05 % lotion Apply 1 application topically daily. To face      . Emollient (LUBRIDERM SERIOUSLY SENSITIVE) LOTN Apply 1 application topically daily. To upper body and face      . guaifenesin (HUMIBID E) 400 MG TABS tablet Take 400 mg by mouth every 4 (four) hours as needed (for sinus drainage).      Marland Kitchen ibuprofen (ADVIL,MOTRIN) 200 MG tablet Take 400-800 mg by mouth every 4 (four) hours as needed for mild pain (for back pain).      . isosorbide mononitrate (IMDUR) 30 MG 24 hr tablet Take 1 tablet (30 mg total) by mouth daily.  90 tablet  0  . metoprolol succinate (TOPROL-XL) 50 MG 24 hr tablet Take 50 mg by mouth daily. Take with or immediately following a meal.      . multivitamin (THERAGRAN) per  tablet Take 1 tablet by mouth daily.        . nefazodone (SERZONE) 200 MG tablet Take 300 mg by mouth at bedtime.      . pantoprazole (PROTONIX) 20 MG tablet Take 1 tablet (20 mg total) by mouth 2 (two) times daily before a meal.  180 tablet  3  . ramipril (ALTACE) 10 MG capsule Take 10 mg by mouth daily.      Marland Kitchen loratadine (CLARITIN) 10 MG tablet Take 10 mg by mouth daily.      Marland Kitchen NITROSTAT 0.4 MG SL tablet PLACE 1 TABLET (0.4 MG TOTAL) UNDER THE TONGUE EVERY 5 (FIVE) MINUTES AS NEEDED.  25 tablet  0   Scheduled: . [START ON 10/26/2013] aspirin EC  81 mg Oral Daily  . atorvastatin  40 mg Oral QHS  . buPROPion  300 mg Oral Daily  . [START ON 10/26/2013] Chlorhexidine Gluconate Cloth  6 each Topical Q0600  . isosorbide mononitrate  30 mg Oral Daily  . loratadine  10 mg Oral Daily  . loratadine  10 mg Oral Daily  . metoprolol succinate  50 mg Oral Daily  . mupirocin ointment  1 application Nasal BID  . nefazodone  300 mg Oral QHS  . pantoprazole  20 mg Oral BID  . ramipril  10 mg Oral Daily  . sodium chloride  3 mL Intravenous Q12H   Continuous: . heparin 1,400 Units/hr (10/25/13 1931)   GUY:QIHKVQ chloride, acetaminophen, diazepam, morphine injection, ondansetron (ZOFRAN) IV, oxyCODONE-acetaminophen, sodium chloride Anti-infectives   None      Results for orders placed during the hospital encounter of 10/25/13 (from the past 48 hour(s))  BASIC METABOLIC PANEL     Status: Abnormal   Collection Time    10/25/13  8:37 AM      Result Value Ref Range   Sodium 138  137 - 147 mEq/L   Potassium 4.3  3.7 - 5.3 mEq/L   Chloride 103  96 - 112 mEq/L   CO2 19  19 - 32 mEq/L   Glucose, Bld 80  70 - 99 mg/dL   BUN 7  6 - 23 mg/dL   Creatinine, Ser 0.84  0.50 - 1.35 mg/dL   Calcium 9.2  8.4 - 10.5 mg/dL   GFR calc non Af Amer >90  >90 mL/min   GFR calc Af Amer >90  >90  mL/min   Comment: (NOTE)     The eGFR has been calculated using the CKD EPI equation.     This calculation has not been  validated in all clinical situations.     eGFR's persistently <90 mL/min signify possible Chronic Kidney     Disease.   Anion gap 16 (*) 5 - 15  CBC     Status: None   Collection Time    10/25/13  8:37 AM      Result Value Ref Range   WBC 5.0  4.0 - 10.5 K/uL   RBC 4.40  4.22 - 5.81 MIL/uL   Hemoglobin 14.9  13.0 - 17.0 g/dL   HCT 42.8  39.0 - 52.0 %   MCV 97.3  78.0 - 100.0 fL   MCH 33.9  26.0 - 34.0 pg   MCHC 34.8  30.0 - 36.0 g/dL   RDW 12.8  11.5 - 15.5 %   Platelets 185  150 - 400 K/uL  PLATELET INHIBITION P2Y12     Status: Abnormal   Collection Time    10/25/13  8:37 AM      Result Value Ref Range   Platelet Function  P2Y12 114 (*) 194 - 418 PRU   Comment:            The literature has shown a direct     correlation of PRU values over     230 with higher risks of     thrombotic events.  Lower PRU     values are associated with     platelet inhibition.  PROTIME-INR     Status: None   Collection Time    10/25/13  8:37 AM      Result Value Ref Range   Prothrombin Time 14.3  11.6 - 15.2 seconds   INR 1.11  0.00 - 1.49  SURGICAL PCR SCREEN     Status: Abnormal   Collection Time    10/25/13  4:40 PM      Result Value Ref Range   MRSA, PCR POSITIVE (*) NEGATIVE   Comment: RESULT CALLED TO, READ BACK BY AND VERIFIED WITH:     H CHURCH RN [id number] 10/25/13 A BROWNING   Staphylococcus aureus POSITIVE (*) NEGATIVE   Comment:            The Xpert SA Assay (FDA     approved for NASAL specimens     in patients over 81 years of age),     is one component of     a comprehensive surveillance     program.  Test performance has     been validated by [employer] for patients greater     than or equal to 67 year old.     It is not intended     to diagnose infection nor to     guide or monitor treatment.    No results found.  Review of Systems  Constitutional: Positive for malaise/fatigue. Negative for fever, chills, weight loss and diaphoresis.  HENT: Negative.   Eyes:  Negative.   Respiratory: Negative for cough and shortness of breath.   Cardiovascular: Positive for chest pain. Negative for orthopnea, leg swelling and PND.  Gastrointestinal: Negative.   Genitourinary: Negative.   Musculoskeletal: Negative.   Skin: Negative.   Neurological: Negative.   Endo/Heme/Allergies: Negative.   Psychiatric/Behavioral: Negative.    Blood pressure 130/77, pulse 83, temperature 97.7 F (36.5 C), temperature source Oral, resp. rate 20,  height _0  (1.88 m), weight 111.131 kg (245 lb), SpO2 92.00%. Physical Exam  Constitutional: He is oriented to person, place, and time. He appears well-developed and well-nourished. No distress.  HENT:  Head: Normocephalic and atraumatic.  Mouth/Throat: Oropharynx is clear and moist.  Eyes: EOM are normal. Pupils are equal, round, and reactive to light.  Neck: Normal range of motion. Neck supple. No JVD present. No thyromegaly present.  Cardiovascular: Normal rate, regular rhythm, normal heart sounds and intact distal pulses.  Exam reveals no friction rub.   No murmur heard. Respiratory: Effort normal and breath sounds normal. No respiratory distress.  GI: Soft. Bowel sounds are normal. He exhibits no distension and no mass. There is no tenderness.  Musculoskeletal: Normal range of motion. He exhibits no edema.  Lymphadenopathy:    He has no cervical adenopathy.  Neurological: He is alert and oriented to person, place, and time. He has normal strength. No cranial nerve deficit or sensory deficit.  Skin: Skin is warm and dry.  Psychiatric: He has a normal mood and affect.   Cardiac Catheterization Procedure Note  Name: RANSOME HELWIG  MRN: [id number]  DOB: 07-Mar-1951  Procedure: Left Heart Cath, Selective Coronary Angiography, LV angiography  Indication: 63 year old gentleman with extensive CAD. He has undergone multiple PCI procedures. He presented with recurrence of his typical anginal symptoms associated with low-level  activity. A nuclear scan was performed in order to localize ischemia. This study demonstrated marked anterolateral ischemia with high risk features. He presents for cardiac catheterization and possible PCI.  Procedural Details: The right wrist was prepped, draped, and anesthetized with 1% lidocaine. Using the modified Seldinger technique, a 5/6 French Slender sheath was introduced into the right radial artery. 3 mg of verapamil was administered through the sheath, weight-based unfractionated heparin was administered intravenously. Standard Judkins catheters were used for selective coronary angiography and left ventriculography. Catheter exchanges were performed over an exchange length guidewire. There were no immediate procedural complications. A TR band was used for radial hemostasis at the completion of the procedure. The patient was transferred to the post catheterization recovery area for further monitoring.  Procedural Findings:  Hemodynamics:  AO 112/65  LV 111/9  Coronary angiography:  Coronary dominance: right  Left mainstem: The left mainstem is heavily calcified. The vessel has 90-95% proximal stenosis, new from the previous study. There is irregularity to the plaque with evidence of ulceration.  Left anterior descending (LAD): The LAD is diffusely diseased. The proximal vessel at the first diagonal branch has 75% stenosis. The mid vessel at the origin of the second diagonal branch as 50-60% stenosis with diffuse disease involving the entire distal vessel.  Left circumflex (LCx): The left circumflex is patent. The mid circumflex was stented with no significant in-stent restenosis. The OM branches are patent.  Right coronary artery (RCA): The right coronary cusp is severely calcified. The RCA is heavily stented and calcified throughout its course. The stented segment involving the ostium and proximal portion of the RCA is severely restenosed with diffuse 80-90% in-stent restenosis. The remaining  portions of the RCA are patent with moderate mid vessel stenosis of 40-50%. The distal vessel has diffuse irregularity. The PDA and PLA branches are patent with mild diffuse irregularity.  Left ventriculography: Left ventricular systolic function is normal, LVEF is estimated at 55-65%, there is no significant mitral regurgitation  Contrast: 70 cc Omnipaque  Radiation dose/Fluoro time: 3.6 minutes  Estimated Blood Loss: minimal  Final Conclusions:  1. Severe left main disease  2. Severe proximal RCA stenosis (in-stent)  3. Severe proximal LAD stenosis  4. Patent LCx  5. Normal LV systolic function  Recommendations: The patient has an accelerating pattern of angina. He had a high-risk nuclear scan and has high-risk coronary anatomy. I have recommended hospital admission and IV heparin for anticoagulation. Cardiac surgical evaluation will be obtained. P2Y12 testing this AM demonstrated significant platelet inhibition with PRU of 114. Recommend recheck of PRU in 48-72 hours.  [name] MD, [facility]  10/25/2013, 11:24 AM   Assessment/Plan:  He has high grade left main and multi-vessel coronary artery disease with progressive anginal symptoms. I agree with the need for coronary artery bypass graft surgery to prevent further ischemia and infarction and sudden death.I discussed the operative procedure with the patient and family including alternatives, benefits and risks; including but not limited to bleeding, blood transfusion, infection, stroke, myocardial infarction, graft failure, heart block requiring a permanent pacemaker, organ dysfunction, and death.  Dillard Essex Jupin understands and agrees to proceed. He has been on Plavix chronically and his PRU was only 114 this am so we will wait until later this week to do surgery. We will schedule surgery for Friday am.    Gaye Pollack 10/25/2013, 8:40 PM

## 2013-10-25 NOTE — Interval H&P Note (Signed)
Cath Lab Visit (complete for each Cath Lab visit)  Clinical Evaluation Leading to the Procedure:   ACS: No.  Non-ACS:    Anginal Classification: CCS III  Anti-ischemic medical therapy: Maximal Therapy (2 or more classes of medications)  Non-Invasive Test Results: High-risk stress test findings: cardiac mortality >3%/year  Prior CABG: No previous CABG      History and Physical Interval Note:  10/25/2013 10:42 AM  Gregory Hansen  has presented today for surgery, with the diagnosis of cad/abnormal mioview  The various methods of treatment have been discussed with the patient and family. After consideration of risks, benefits and other options for treatment, the patient has consented to  Procedure(s): LEFT HEART CATHETERIZATION WITH CORONARY ANGIOGRAM (N/A) as a surgical intervention .  The patient's history has been reviewed, patient examined, no change in status, stable for surgery.  I have reviewed the patient's chart and labs.  Questions were answered to the patient's satisfaction.     [name]

## 2013-10-25 NOTE — H&P (View-Only) (Signed)
HPI:  63 year old gentleman presenting for followup evaluation. The patient has been followed for coronary artery disease. He initially presented in 2002 with an inferior wall MI. He's had multiple PCI procedures since that time. He presented in January 2015 with symptoms of unstable angina and was found to have severe stenosis in the ostium of the right coronary artery and was treated with a drug-eluting stent. The patient called in with worsening symptoms of chest discomfort and he was added onto our schedule today.  The patient describes return of chest discomfort with activity. When he exercises or walks any significant distance, he develops chest pressure in the center of his chest. This resolves within a few minutes of rest. He has not required nitroglycerin. Symptoms are progressive over the last one month. He also complains of generalized fatigue. He denies lightheadedness or syncope. He has no other complaints. He's been compliant with his medications.   Outpatient Encounter Prescriptions as of 10/14/2013  Medication Sig  . aspirin 81 MG tablet Take 1 tablet (81 mg total) by mouth daily.  Marland Kitchen atorvastatin (LIPITOR) 40 MG tablet Take 40 mg by mouth daily.  Marland Kitchen buPROPion (WELLBUTRIN XL) 150 MG 24 hr tablet Take 300 mg by mouth daily.   . clopidogrel (PLAVIX) 75 MG tablet TAKE 1 TABLET DAILY  . desonide (DESOWEN) 0.05 % lotion Apply 1 application topically daily.   Marland Kitchen loratadine (CLARITIN) 10 MG tablet Take 10 mg by mouth daily.  . metoprolol succinate (TOPROL-XL) 50 MG 24 hr tablet TAKE 1 TABLET DAILY  . multivitamin (THERAGRAN) per tablet Take 1 tablet by mouth daily.    . nefazodone (SERZONE) 200 MG tablet Take 300 mg by mouth at bedtime.  Marland Kitchen NITROSTAT 0.4 MG SL tablet PLACE 1 TABLET (0.4 MG TOTAL) UNDER THE TONGUE EVERY 5 (FIVE) MINUTES AS NEEDED.  Marland Kitchen pantoprazole (PROTONIX) 20 MG tablet Take 1 tablet (20 mg total) by mouth 2 (two) times daily before a meal.  . ramipril (ALTACE) 10 MG  capsule Take 10 mg by mouth daily.  . [DISCONTINUED] sildenafil (VIAGRA) 100 MG tablet Take 100 mg by mouth daily as needed for erectile dysfunction (take 1/2).  Marland Kitchen isosorbide mononitrate (IMDUR) 30 MG 24 hr tablet Take 1 tablet (30 mg total) by mouth daily.  . [DISCONTINUED] clopidogrel (PLAVIX) 75 MG tablet Take 75 mg by mouth daily with breakfast.  . [DISCONTINUED] famotidine (PEPCID) 20 MG tablet Take 20 mg by mouth 2 (two) times daily. Take 1 tab daily, can take BID PRN  . [DISCONTINUED] metoprolol succinate (TOPROL-XL) 50 MG 24 hr tablet Take 50 mg by mouth daily. Take with or immediately following a meal.  . [DISCONTINUED] nitroGLYCERIN (NITROSTAT) 0.4 MG SL tablet Place 0.4 mg under the tongue every 5 (five) minutes as needed for chest pain.    No Known Allergies  Past Medical History  Diagnosis Date  . Hyperlipidemia     mixed  . Spondylitis, ankylosing   . GERD (gastroesophageal reflux disease)   . Depression   . Hypertension   . Coronary artery disease     s/p multiple percutaneous interventions, PCI 202 for DMI and DES distal RCA and CFX-OM 2006  . Myocardial infarction   . Anxiety   . Arthritis     SPINE     ROS: Negative except as per HPI  BP 130/90  Pulse 63  Ht 6\' 2"  (1.88 m)  Wt 248 lb (112.492 kg)  BMI 31.83 kg/m2  PHYSICAL EXAM: Pt is alert  and oriented, pleasant obese male in NAD HEENT: normal Neck: JVP - normal, carotids 2+= without bruits Lungs: CTA bilaterally CV: RRR without murmur or gallop Abd: soft, NT, Positive BS, no hepatomegaly Ext: no C/C/E, distal pulses intact and equal Skin: warm/dry no rash  EKG:  Normal sinus rhythm 63 beats per minute, possible age-indeterminate inferior MI, otherwise within normal limits.  ASSESSMENT AND PLAN: 1. Coronary artery disease, native vessel. The patient has exertional angina, CCS class III. I reviewed his previous cardiac catheterization study and he has mild to moderate left mainstem disease. It is  possible that he has in-stent restenosis as he has undergone multiple PCI procedures involving the RCA. I have recommended that we add isosorbide 15 mg daily for one week then 30 mg daily. He understands that he cannot take medicine for erectile dysfunction while he is on long-acting nitrates. Will also check an exercise Myoview scan to further risk stratify him and try to localize ischemia. Further followup pending the results of the stress test. He may ultimately require repeat cardiac catheterization. Other medications will be continued without changes.  2. Hypertension. Blood pressure is well controlled on a combination of ramipril and metoprolol succinate. Will add a long-acting nitrate as above.  3. Hyperlipidemia. The patient takes atorvastatin 40 mg. Last lipids from September 2014 showed cholesterol 167, crit) 93, HDL 54, and LDL 94.  [name] 10/14/2013 2:27 PM

## 2013-10-25 NOTE — Progress Notes (Addendum)
ANTICOAGULATION CONSULT NOTE - Initial Consult  Pharmacy Consult for heparin Indication: chest pain/ACS  No Known Allergies  Patient Measurements: Height: 6\' 2"  (188 cm) Weight: 245 lb (111.131 kg) IBW/kg (Calculated) : 82.2 Heparin Dosing Weight: 105kg  Vital Signs: Temp: 98 F (36.7 C) (08/17 0829) Temp src: Oral (08/17 0829) BP: 127/78 mmHg (08/17 1300) Pulse Rate: 67 (08/17 1300)  Labs:  Recent Labs  10/25/13 0837  HGB 14.9  HCT 42.8  PLT 185  LABPROT 14.3  INR 1.11  CREATININE 0.84    Estimated Creatinine Clearance: 117.9 ml/min (by C-G formula based on Cr of 0.84).   Medical History: Past Medical History  Diagnosis Date  . Hyperlipidemia     mixed  . Spondylitis, ankylosing   . GERD (gastroesophageal reflux disease)   . Depression   . Hypertension   . Coronary artery disease     s/p multiple percutaneous interventions, PCI 202 for DMI and DES distal RCA and CFX-OM 2006  . Myocardial infarction   . Anxiety   . Arthritis     SPINE     Medications:  Prescriptions prior to admission  Medication Sig Dispense Refill  . aspirin 81 MG tablet Take 1 tablet (81 mg total) by mouth daily.      Marland Kitchen atorvastatin (LIPITOR) 40 MG tablet Take 40 mg by mouth daily.      Marland Kitchen buPROPion (WELLBUTRIN XL) 300 MG 24 hr tablet Take 300 mg by mouth daily.      . cetirizine (ZYRTEC) 10 MG tablet Take 10 mg by mouth daily.      . clopidogrel (PLAVIX) 75 MG tablet Take 75 mg by mouth daily.      Marland Kitchen desonide (DESOWEN) 0.05 % lotion Apply 1 application topically daily. To face      . Emollient (LUBRIDERM SERIOUSLY SENSITIVE) LOTN Apply 1 application topically daily. To upper body and face      . guaifenesin (HUMIBID E) 400 MG TABS tablet Take 400 mg by mouth every 4 (four) hours as needed (for sinus drainage).      Marland Kitchen ibuprofen (ADVIL,MOTRIN) 200 MG tablet Take 400-800 mg by mouth every 4 (four) hours as needed for mild pain (for back pain).      . isosorbide mononitrate (IMDUR) 30  MG 24 hr tablet Take 1 tablet (30 mg total) by mouth daily.  90 tablet  0  . metoprolol succinate (TOPROL-XL) 50 MG 24 hr tablet Take 50 mg by mouth daily. Take with or immediately following a meal.      . multivitamin (THERAGRAN) per tablet Take 1 tablet by mouth daily.        . nefazodone (SERZONE) 200 MG tablet Take 300 mg by mouth at bedtime.      . pantoprazole (PROTONIX) 20 MG tablet Take 1 tablet (20 mg total) by mouth 2 (two) times daily before a meal.  180 tablet  3  . ramipril (ALTACE) 10 MG capsule Take 10 mg by mouth daily.      Marland Kitchen loratadine (CLARITIN) 10 MG tablet Take 10 mg by mouth daily.      Marland Kitchen NITROSTAT 0.4 MG SL tablet PLACE 1 TABLET (0.4 MG TOTAL) UNDER THE TONGUE EVERY 5 (FIVE) MINUTES AS NEEDED.  25 tablet  0   Scheduled:  . aspirin  81 mg Oral Daily  . atorvastatin  40 mg Oral Daily  . buPROPion  300 mg Oral Daily  . isosorbide mononitrate  30 mg Oral Daily  . loratadine  10 mg Oral Daily  . loratadine  10 mg Oral Daily  . metoprolol succinate  50 mg Oral Daily  . nefazodone  300 mg Oral QHS  . pantoprazole  20 mg Oral BID AC  . ramipril  10 mg Oral Daily  . sodium chloride  3 mL Intravenous Q12H    Assessment: 63 yo male with CP and history of CAD and s/p cath with severe left main disease and for cardiac surgery evaluation.  Sheath removed 11:19pm and TR band removed.  Heparin to start 4 hours after TR band removal (to removed at about 3:30pm).  Goal of Therapy:  Heparin level 0.3-0.7 units/ml Monitor platelets by anticoagulation protocol: Yes   Plan:  -Start heparin at 1400 units/hr 4 hours post TR band removal (begin at 7:30pm) -Heparin level in 6 hours and daily with CBC daily  Hildred Laser, Pharm D 10/25/2013 1:43 PM

## 2013-10-25 NOTE — Progress Notes (Signed)
Pt eating without complaint.

## 2013-10-25 NOTE — CV Procedure (Signed)
    Cardiac Catheterization Procedure Note  Name: Gregory Hansen MRN: [id number] DOB: 1950-03-26  Procedure: Left Heart Cath, Selective Coronary Angiography, LV angiography  Indication: 63 year old gentleman with extensive CAD. He has undergone multiple PCI procedures. He presented with recurrence of his typical anginal symptoms associated with low-level activity. A nuclear scan was performed in order to localize ischemia. This study demonstrated marked anterolateral ischemia with high risk features. He presents for cardiac catheterization and possible PCI.   Procedural Details: The right wrist was prepped, draped, and anesthetized with 1% lidocaine. Using the modified Seldinger technique, a 5/6 French Slender sheath was introduced into the right radial artery. 3 mg of verapamil was administered through the sheath, weight-based unfractionated heparin was administered intravenously. Standard Judkins catheters were used for selective coronary angiography and left ventriculography. Catheter exchanges were performed over an exchange length guidewire. There were no immediate procedural complications. A TR band was used for radial hemostasis at the completion of the procedure.  The patient was transferred to the post catheterization recovery area for further monitoring.  Procedural Findings: Hemodynamics: AO 112/65 LV 111/9  Coronary angiography: Coronary dominance: right  Left mainstem: The left mainstem is heavily calcified. The vessel has 90-95% proximal stenosis, new from the previous study. There is irregularity to the plaque with evidence of ulceration.  Left anterior descending (LAD): The LAD is diffusely diseased. The proximal vessel at the first diagonal branch has 75% stenosis. The mid vessel at the origin of the second diagonal branch as 50-60% stenosis with diffuse disease involving the entire distal vessel.  Left circumflex (LCx): The left circumflex is patent. The mid circumflex was  stented with no significant in-stent restenosis. The OM branches are patent.  Right coronary artery (RCA): The right coronary cusp is severely calcified. The RCA is heavily stented and calcified throughout its course. The stented segment involving the ostium and proximal portion of the RCA is severely restenosed with diffuse 80-90% in-stent restenosis. The remaining portions of the RCA are patent with moderate mid vessel stenosis of 40-50%. The distal vessel has diffuse irregularity. The PDA and PLA branches are patent with mild diffuse irregularity.  Left ventriculography: Left ventricular systolic function is normal, LVEF is estimated at 55-65%, there is no significant mitral regurgitation   Contrast: 70 cc Omnipaque  Radiation dose/Fluoro time: 3.6 minutes  Estimated Blood Loss: minimal  Final Conclusions:   1. Severe left main disease 2. Severe proximal RCA stenosis (in-stent) 3. Severe proximal LAD stenosis 4. Patent LCx 5. Normal LV systolic function  Recommendations: The patient has an accelerating pattern of angina. He had a high-risk nuclear scan and has high-risk coronary anatomy. I have recommended hospital admission and IV heparin for anticoagulation. Cardiac surgical evaluation will be obtained. P2Y12 testing this AM demonstrated significant platelet inhibition with PRU of 114. Recommend recheck of PRU in 48-72 hours.   [name] MD, [facility] 10/25/2013, 11:24 AM

## 2013-10-25 NOTE — Progress Notes (Signed)
Pt MRSA swab positive. Pt placed on Contact isolation and education done.

## 2013-10-26 DIAGNOSIS — E785 Hyperlipidemia, unspecified: Secondary | ICD-10-CM

## 2013-10-26 DIAGNOSIS — I10 Essential (primary) hypertension: Secondary | ICD-10-CM

## 2013-10-26 DIAGNOSIS — I200 Unstable angina: Secondary | ICD-10-CM

## 2013-10-26 DIAGNOSIS — I2510 Atherosclerotic heart disease of native coronary artery without angina pectoris: Secondary | ICD-10-CM

## 2013-10-26 DIAGNOSIS — Z01810 Encounter for preprocedural cardiovascular examination: Secondary | ICD-10-CM

## 2013-10-26 LAB — CBC
HCT: 38.5 % — ABNORMAL LOW (ref 39.0–52.0)
Hemoglobin: 13.1 g/dL (ref 13.0–17.0)
MCH: 33.1 pg (ref 26.0–34.0)
MCHC: 34 g/dL (ref 30.0–36.0)
MCV: 97.2 fL (ref 78.0–100.0)
Platelets: 157 10*3/uL (ref 150–400)
RBC: 3.96 MIL/uL — ABNORMAL LOW (ref 4.22–5.81)
RDW: 13 % (ref 11.5–15.5)
WBC: 5.2 10*3/uL (ref 4.0–10.5)

## 2013-10-26 LAB — HEPARIN LEVEL (UNFRACTIONATED)
Heparin Unfractionated: 0.11 IU/mL — ABNORMAL LOW (ref 0.30–0.70)
Heparin Unfractionated: 0.67 IU/mL (ref 0.30–0.70)
Heparin Unfractionated: 0.7 IU/mL (ref 0.30–0.70)

## 2013-10-26 MED ORDER — ATORVASTATIN CALCIUM 80 MG PO TABS
80.0000 mg | ORAL_TABLET | Freq: Every day | ORAL | Status: DC
  Administered 2013-10-26 – 2013-11-01 (×7): 80 mg via ORAL
  Filled 2013-10-26 (×9): qty 1

## 2013-10-26 MED ORDER — LISINOPRIL 20 MG PO TABS
20.0000 mg | ORAL_TABLET | Freq: Every day | ORAL | Status: DC
  Administered 2013-10-27: 20 mg via ORAL
  Filled 2013-10-26 (×2): qty 1

## 2013-10-26 NOTE — Progress Notes (Signed)
[place] for heparin Indication: chest pain/ACS  No Known Allergies  Patient Measurements: Height: 6\' 2"  (188 cm) Weight: 245 lb (111.131 kg) IBW/kg (Calculated) : 82.2 Heparin Dosing Weight: 105kg  Vital Signs: Temp: 98.2 F (36.8 C) (08/18 0800) Temp src: Oral (08/18 0800) BP: 153/91 mmHg (08/18 1040) Pulse Rate: 67 (08/18 0800)  Labs:  Recent Labs  10/25/13 0837 10/26/13 0145 10/26/13 1015  HGB 14.9 13.1  --   HCT 42.8 38.5*  --   PLT 185 157  --   LABPROT 14.3  --   --   INR 1.11  --   --   HEPARINUNFRC  --  0.11* 0.67  CREATININE 0.84  --   --     Estimated Creatinine Clearance: 117.9 ml/min (by C-G formula based on Cr of 0.84).   Assessment: 63 yo male with CP and history of CAD and s/p cath with severe left main disease and for CABG on Friday.  Patient on heparin at 1700 units/hr and heparin level is noted at goal (HL= 0.67)   Goal of Therapy:  Heparin level 0.3-0.7 units/ml Monitor platelets by anticoagulation protocol: Yes   Plan:  -No heparin changes needed -Will re-check a heparin level later today to confirm -Daily CBC and heparin level  Hildred Laser, Pharm D 10/26/2013 11:39 AM

## 2013-10-26 NOTE — Progress Notes (Signed)
[place] for heparin Indication: chest pain/ACS  No Known Allergies  Patient Measurements: Height: 6\' 2"  (188 cm) Weight: 245 lb (111.131 kg) IBW/kg (Calculated) : 82.2 Heparin Dosing Weight: 105kg  Vital Signs: Temp: 98 F (36.7 C) (08/17 2307) Temp src: Oral (08/17 2307) BP: 142/77 mmHg (08/18 0000) Pulse Rate: 61 (08/18 0000)  Labs:  Recent Labs  10/25/13 0837 10/26/13 0145  HGB 14.9 13.1  HCT 42.8 38.5*  PLT 185 157  LABPROT 14.3  --   INR 1.11  --   HEPARINUNFRC  --  0.11*  CREATININE 0.84  --     Estimated Creatinine Clearance: 117.9 ml/min (by C-G formula based on Cr of 0.84).   Medical History: Past Medical History  Diagnosis Date  . Hyperlipidemia     mixed  . Spondylitis, ankylosing   . GERD (gastroesophageal reflux disease)   . Depression   . Hypertension   . Coronary artery disease     s/p multiple percutaneous interventions, PCI 202 for DMI and DES distal RCA and CFX-OM 2006  . Myocardial infarction   . Anxiety   . Arthritis     SPINE   . Anginal pain    Assessment: 63 yo male with CP and history of CAD and s/p cath with severe left main disease and for cardiac surgery evaluation.    Initial heparin level below goal on 1400 units/hr. No bleeding or IV line issues noted. Hgb trending down but still within normal limits, pltc 157. Will adjust rate.  P2y12 was 114 - plan to retest in 2-3d  Goal of Therapy:  Heparin level 0.3-0.7 units/ml Monitor platelets by anticoagulation protocol: Yes   Plan:  -Increase heparin to 1700 units/hr -Heparin level in 6 hours and daily with CBC daily  Erin Hearing PharmD., BCPS Clinical Pharmacist Pager [phone] 10/26/2013 3:16 AM

## 2013-10-26 NOTE — Progress Notes (Signed)
Will hold ambulation due to severe LM dz. Pre-op ed completed including sternal precautions, mobility, IS use. Voiced understanding. Gave instructions to read [facility] booklet and guideline. Able to inspire 2500 ml on IS. Will watch video with his kids later. Has been practicing vegan diet this past week. Also very interested in [place]. [phone] Yves Dill CES, ACSM 10:19 AM 10/26/2013

## 2013-10-26 NOTE — Progress Notes (Signed)
Patient Name: Gregory Hansen Date of Encounter: 10/26/2013  Active Problems:   Intermediate coronary syndrome   Length of Stay: 1  SUBJECTIVE  CP free at rest. Tightness on exertion.   CURRENT MEDS . aspirin EC  81 mg Oral Daily  . atorvastatin  40 mg Oral QHS  . buPROPion  300 mg Oral Daily  . Chlorhexidine Gluconate Cloth  6 each Topical Q0600  . isosorbide mononitrate  30 mg Oral Daily  . loratadine  10 mg Oral Daily  . metoprolol succinate  50 mg Oral Daily  . mupirocin ointment  1 application Nasal BID  . nefazodone  300 mg Oral QHS  . pantoprazole  20 mg Oral BID  . ramipril  10 mg Oral Daily  . sodium chloride  3 mL Intravenous Q12H    OBJECTIVE  Filed Vitals:   10/26/13 0359 10/26/13 0400 10/26/13 0800 10/26/13 1040  BP: 131/82 131/82 148/96 153/91  Pulse: 67 69 67   Temp: 98.3 F (36.8 C)  98.2 F (36.8 C)   TempSrc: Oral  Oral   Resp: 18 18 24 24   Height:      Weight:      SpO2: 96% 96% 95%     Intake/Output Summary (Last 24 hours) at 10/26/13 1219 Last data filed at 10/26/13 1100  Gross per 24 hour  Intake 3808.35 ml  Output   2725 ml  Net 1083.35 ml   Filed Weights   10/25/13 0829 10/25/13 1300  Weight: 245 lb (111.131 kg) 245 lb (111.131 kg)   PHYSICAL EXAM  General: Pleasant, NAD. Neuro: Alert and oriented X 3. Moves all extremities spontaneously. Psych: Normal affect. HEENT:  Normal  Neck: Supple without bruits or JVD. Lungs:  Resp regular and unlabored, CTA. Heart: RRR no s3, s4, or murmurs. Abdomen: Soft, non-tender, non-distended, BS + x 4.  Extremities: No clubbing, cyanosis or edema. DP/PT/Radials 2+ and equal bilaterally.  Accessory Clinical Findings  CBC  Recent Labs  10/25/13 0837 10/26/13 0145  WBC 5.0 5.2  HGB 14.9 13.1  HCT 42.8 38.5*  MCV 97.3 97.2  PLT 185 174   Basic Metabolic Panel  Recent Labs  10/25/13 0837  NA 138  K 4.3  CL 103  CO2 19  GLUCOSE 80  BUN 7  CREATININE 0.84  CALCIUM 9.2    Radiology/Studies  No results found.  TELE: SR  ECG:   Left cardiac cath: 10/25/2013  Final Conclusions:  1. Severe left main disease  2. Severe proximal RCA stenosis (in-stent)  3. Severe proximal LAD stenosis  4. Patent LCx  5. Normal LV systolic function  Recommendations: The patient has an accelerating pattern of angina. He had a high-risk nuclear scan and has high-risk coronary anatomy. I have recommended hospital admission and IV heparin for anticoagulation. Cardiac surgical evaluation will be obtained. P2Y12 testing this AM demonstrated significant platelet inhibition with PRU of 114. Recommend recheck of PRU in 48-72 hours.     ASSESSMENT AND PLAN  63 year old gentleman with known CAD, multiple PCIs in the past who was admitted for accelerated angina    1, CAD - cath yesterday showed severe LM disease, severe prox LAD disease and ISR in prox RCA. He was on Plavix with significant platelet inhibition, PRU 114, we will recheck tomorrow. Continue iv Heparin, aspirin, metoprolol, ACEI. No echo on file, but recent nuclear study showed LVEF 53% with apical hypokinesis. No signs of CHF, the patient is euvolemic.   2. HTN -  increase ACEI lisinopril 20 mg po daily.   3. Hyperlipidemia - increase atorvastatin to 80 mg po daily   Signed, Dorothy Spark MD, [facility] 10/26/2013

## 2013-10-26 NOTE — Progress Notes (Signed)
[place] for heparin Indication: chest pain/ACS  No Known Allergies  Patient Measurements: Height: 6\' 2"  (188 cm) Weight: 245 lb (111.131 kg) IBW/kg (Calculated) : 82.2 Heparin Dosing Weight: 105kg  Vital Signs: Temp: 99.6 F (37.6 C) (08/18 1624) Temp src: Oral (08/18 1624) BP: 122/75 mmHg (08/18 1624) Pulse Rate: 61 (08/18 1624)  Labs:  Recent Labs  10/25/13 0837 10/26/13 0145 10/26/13 1015 10/26/13 1635  HGB 14.9 13.1  --   --   HCT 42.8 38.5*  --   --   PLT 185 157  --   --   LABPROT 14.3  --   --   --   INR 1.11  --   --   --   HEPARINUNFRC  --  0.11* 0.67 0.70  CREATININE 0.84  --   --   --     Estimated Creatinine Clearance: 117.9 ml/min (by C-G formula based on Cr of 0.84).   Assessment: 63 yo male with CP and history of CAD and s/p cath with severe left main disease and for CABG on Friday.  Patient on heparin at 1700 units/hr and heparin level is noted at upper end of goal and rising.  Goal of Therapy:  Heparin level 0.3-0.7 units/ml Monitor platelets by anticoagulation protocol: Yes   Plan:  -Decrease heparin to 1650 units/hr. -Daily CBC and heparin level  [employer], Pharm.D., BCPS Clinical Pharmacist Pager [phone] 10/26/2013 5:41 PM

## 2013-10-26 NOTE — Progress Notes (Signed)
Pre-op Cardiac Surgery  Carotid Findings:  Bilateral:  1-39% ICA stenosis.  Vertebral artery flow is antegrade.    Landry Mellow, RDMS, RVT 10/26/2013    LIMB DOPPLERS PENDING   Upper Extremity Right Left  Brachial Pressures    Radial Waveforms    Ulnar Waveforms    Palmar Arch (Allen's Test)     Findings:      Lower  Extremity Right Left  Dorsalis Pedis    Anterior Tibial    Posterior Tibial    Ankle/Brachial Indices      Findings:

## 2013-10-26 NOTE — Plan of Care (Signed)
Problem: Phase II Progression Outcomes Goal: Ambulates up to 600 ft. in hall x 1 Outcome: Not Met (add Reason) Pt is able to ambulate in the room without assistance and without difficulty. Pt has LM 90-95% blockage, will not ambulate in the hall until cleared with surgeon.

## 2013-10-27 ENCOUNTER — Inpatient Hospital Stay (HOSPITAL_COMMUNITY): Payer: BC Managed Care – PPO

## 2013-10-27 DIAGNOSIS — I209 Angina pectoris, unspecified: Secondary | ICD-10-CM

## 2013-10-27 LAB — PULMONARY FUNCTION TEST
FEF 25-75 POST: 2.9 L/s
FEF 25-75 Pre: 1.97 L/sec
FEF2575-%Change-Post: 47 %
FEF2575-%PRED-POST: 92 %
FEF2575-%Pred-Pre: 62 %
FEV1-%Change-Post: 7 %
FEV1-%PRED-POST: 70 %
FEV1-%PRED-PRE: 65 %
FEV1-POST: 2.82 L
FEV1-PRE: 2.62 L
FEV1FVC-%CHANGE-POST: 8 %
FEV1FVC-%PRED-PRE: 100 %
FEV6-%Change-Post: 0 %
FEV6-%Pred-Post: 68 %
FEV6-%Pred-Pre: 67 %
FEV6-Post: 3.47 L
FEV6-Pre: 3.45 L
FEV6FVC-%Change-Post: 0 %
FEV6FVC-%Pred-Post: 105 %
FEV6FVC-%Pred-Pre: 104 %
FVC-%CHANGE-POST: 0 %
FVC-%Pred-Post: 65 %
FVC-%Pred-Pre: 65 %
FVC-Post: 3.47 L
FVC-Pre: 3.48 L
PRE FEV6/FVC RATIO: 99 %
Post FEV1/FVC ratio: 81 %
Post FEV6/FVC ratio: 100 %
Pre FEV1/FVC ratio: 75 %

## 2013-10-27 LAB — CBC
HCT: 39.3 % (ref 39.0–52.0)
Hemoglobin: 13.5 g/dL (ref 13.0–17.0)
MCH: 33.1 pg (ref 26.0–34.0)
MCHC: 34.4 g/dL (ref 30.0–36.0)
MCV: 96.3 fL (ref 78.0–100.0)
Platelets: 143 10*3/uL — ABNORMAL LOW (ref 150–400)
RBC: 4.08 MIL/uL — ABNORMAL LOW (ref 4.22–5.81)
RDW: 12.7 % (ref 11.5–15.5)
WBC: 4.7 10*3/uL (ref 4.0–10.5)

## 2013-10-27 LAB — HEPARIN LEVEL (UNFRACTIONATED): Heparin Unfractionated: 0.71 IU/mL — ABNORMAL HIGH (ref 0.30–0.70)

## 2013-10-27 LAB — PLATELET INHIBITION P2Y12: Platelet Function  P2Y12: 207 [PRU] (ref 194–418)

## 2013-10-27 MED ORDER — HEPARIN (PORCINE) IN NACL 100-0.45 UNIT/ML-% IJ SOLN
1550.0000 [IU]/h | INTRAMUSCULAR | Status: DC
  Administered 2013-10-27 (×2): 1550 [IU]/h via INTRAVENOUS
  Filled 2013-10-27 (×2): qty 250

## 2013-10-27 MED ORDER — ALBUTEROL SULFATE (2.5 MG/3ML) 0.083% IN NEBU
2.5000 mg | INHALATION_SOLUTION | Freq: Once | RESPIRATORY_TRACT | Status: AC
  Administered 2013-10-27: 2.5 mg via RESPIRATORY_TRACT

## 2013-10-27 MED ORDER — ISOSORBIDE MONONITRATE ER 60 MG PO TB24
60.0000 mg | ORAL_TABLET | Freq: Every day | ORAL | Status: DC
  Administered 2013-10-28: 60 mg via ORAL
  Filled 2013-10-27 (×2): qty 1

## 2013-10-27 MED ORDER — LISINOPRIL 40 MG PO TABS
40.0000 mg | ORAL_TABLET | Freq: Every day | ORAL | Status: DC
  Administered 2013-10-28: 40 mg via ORAL
  Filled 2013-10-27 (×2): qty 1

## 2013-10-27 MED ORDER — FLUTICASONE PROPIONATE 50 MCG/ACT NA SUSP
2.0000 | Freq: Every day | NASAL | Status: DC
  Administered 2013-10-27 – 2013-11-01 (×5): 2 via NASAL
  Filled 2013-10-27: qty 16

## 2013-10-27 NOTE — Progress Notes (Signed)
Dr Cyndia Bent by to see patient at bedside. Discussion was held regarding pt ambulating in the hall. Dr Cyndia Bent stated it was OK for the pt to walk short distances in the hallway only if he experienced no symptoms (no CP, SOB, etc.)

## 2013-10-27 NOTE — Progress Notes (Signed)
Pre-op Cardiac Surgery  Carotid Findings:  Bilateral:  1-39% ICA stenosis.  Vertebral artery flow is antegrade.    Landry Mellow, RDMS, RVT 10/27/2013    LIMB DOPPLERS PENDING   Upper Extremity Right Left  Brachial Pressures 120 triphasic 127 triphasic  Radial Waveforms triphasic triphasic  Ulnar Waveforms triphasic triphasic  Palmar Arch (Allen's Test) * *   Findings:  *Doppler waveforms decrease greater than 50% with ulnar and remain normal with radial compressions bilaterally.    Lower  Extremity Right Left  Dorsalis Pedis    Anterior Tibial    Posterior Tibial    Ankle/Brachial Indices      Findings:  Palpable pedal pulses x 4.

## 2013-10-27 NOTE — Progress Notes (Signed)
Patient Name: Gregory Hansen Date of Encounter: 10/27/2013  Active Problems:   Intermediate coronary syndrome   Length of Stay: 2  SUBJECTIVE  Mild chest tightness when walking to the bathroom.  CURRENT MEDS . aspirin EC  81 mg Oral Daily  . atorvastatin  80 mg Oral QHS  . buPROPion  300 mg Oral Daily  . Chlorhexidine Gluconate Cloth  6 each Topical Q0600  . fluticasone  2 spray Each Nare Daily  . isosorbide mononitrate  30 mg Oral Daily  . lisinopril  20 mg Oral Daily  . loratadine  10 mg Oral Daily  . metoprolol succinate  50 mg Oral Daily  . mupirocin ointment  1 application Nasal BID  . nefazodone  300 mg Oral QHS  . pantoprazole  20 mg Oral BID  . sodium chloride  3 mL Intravenous Q12H    OBJECTIVE  Filed Vitals:   10/27/13 0013 10/27/13 0400 10/27/13 0800 10/27/13 1003  BP:  164/96 178/88 154/91  Pulse:    68  Temp: 98.5 F (36.9 C)  98.2 F (36.8 C)   TempSrc: Oral  Oral   Resp:  24 23   Height:      Weight:      SpO2: 95%  96%     Intake/Output Summary (Last 24 hours) at 10/27/13 1015 Last data filed at 10/27/13 1004  Gross per 24 hour  Intake 3183.77 ml  Output   3900 ml  Net -716.23 ml   Filed Weights   10/25/13 0829 10/25/13 1300  Weight: 245 lb (111.131 kg) 245 lb (111.131 kg)   PHYSICAL EXAM  General: Pleasant, NAD. Neuro: Alert and oriented X 3. Moves all extremities spontaneously. Psych: Normal affect. HEENT:  Normal  Neck: Supple without bruits or JVD. Lungs:  Resp regular and unlabored, CTA. Heart: RRR no s3, s4, or murmurs. Abdomen: Soft, non-tender, non-distended, BS + x 4.  Extremities: No clubbing, cyanosis or edema. DP/PT/Radials 2+ and equal bilaterally.  Accessory Clinical Findings  CBC  Recent Labs  10/26/13 0145 10/27/13 0244  WBC 5.2 4.7  HGB 13.1 13.5  HCT 38.5* 39.3  MCV 97.2 96.3  PLT 157 374*   Basic Metabolic Panel  Recent Labs  10/25/13 0837  NA 138  K 4.3  CL 103  CO2 19  GLUCOSE 80  BUN  7  CREATININE 0.84  CALCIUM 9.2   Radiology/Studies  No results found.  TELE: SR  ECG:   Left cardiac cath: 10/25/2013  Final Conclusions:  1. Severe left main disease  2. Severe proximal RCA stenosis (in-stent)  3. Severe proximal LAD stenosis  4. Patent LCx  5. Normal LV systolic function  Recommendations: The patient has an accelerating pattern of angina. He had a high-risk nuclear scan and has high-risk coronary anatomy. I have recommended hospital admission and IV heparin for anticoagulation. Cardiac surgical evaluation will be obtained. P2Y12 testing this AM demonstrated significant platelet inhibition with PRU of 114. Recommend recheck of PRU in 48-72 hours.     ASSESSMENT AND PLAN  64 year old gentleman with known CAD, multiple PCIs in the past who was admitted for accelerated angina    1, CAD - cath yesterday showed severe LM disease, severe prox LAD disease and ISR in prox RCA. He was on Plavix with significant platelet inhibition, PRU 114 --> 207. Continue iv Heparin, aspirin, metoprolol, ACEI. No echo on file, but recent nuclear study showed LVEF 53% with apical hypokinesis. No signs of CHF, the  patient is euvolemic.   2. HTN - we will increase lisinopril further to 40 mg po daily, increase imdur to 60 mg po daily.   3. Hyperlipidemia - increase atorvastatin to 80 mg po daily   Signed, Dorothy Spark MD, [facility] 10/27/2013

## 2013-10-27 NOTE — Progress Notes (Signed)
[place] for heparin Indication: chest pain/ACS  No Known Allergies  Patient Measurements: Height: 6\' 2"  (188 cm) Weight: 245 lb (111.131 kg) IBW/kg (Calculated) : 82.2 Heparin Dosing Weight: 105kg  Vital Signs: Temp: 98.2 F (36.8 C) (08/19 0800) Temp src: Oral (08/19 0800) BP: 154/91 mmHg (08/19 1003) Pulse Rate: 68 (08/19 1003)  Labs:  Recent Labs  10/25/13 0837  10/26/13 0145 10/26/13 1015 10/26/13 1635 10/27/13 0244 10/27/13 0944  HGB 14.9  --  13.1  --   --  13.5  --   HCT 42.8  --  38.5*  --   --  39.3  --   PLT 185  --  157  --   --  143*  --   LABPROT 14.3  --   --   --   --   --   --   INR 1.11  --   --   --   --   --   --   HEPARINUNFRC  --   < > 0.11* 0.67 0.70  --  0.71*  CREATININE 0.84  --   --   --   --   --   --   < > = values in this interval not displayed.  Estimated Creatinine Clearance: 117.9 ml/min (by C-G formula based on Cr of 0.84).   Assessment: 63 yo male with CP and history of CAD and s/p cath with severe left main disease and for CABG on Friday.  Patient on heparin at 1650 units/hr and heparin level is just slightly above goal.  Goal of Therapy:  Heparin level 0.3-0.7 units/ml Monitor platelets by anticoagulation protocol: Yes   Plan:  -Decrease heparin to 1550 units/hr. -Daily CBC and heparin level  Hildred Laser, Pharm D 10/27/2013 11:30 AM

## 2013-10-28 ENCOUNTER — Inpatient Hospital Stay (HOSPITAL_COMMUNITY): Payer: BC Managed Care – PPO

## 2013-10-28 LAB — PROTIME-INR
INR: 1.08 (ref 0.00–1.49)
Prothrombin Time: 14 seconds (ref 11.6–15.2)

## 2013-10-28 LAB — BLOOD GAS, ARTERIAL
Acid-base deficit: 0.6 mmol/L (ref 0.0–2.0)
BICARBONATE: 23.5 meq/L (ref 20.0–24.0)
Drawn by: 42246
FIO2: 0.21 %
O2 SAT: 95.9 %
PH ART: 7.404 (ref 7.350–7.450)
Patient temperature: 98.6
TCO2: 24.7 mmol/L (ref 0–100)
pCO2 arterial: 38.3 mmHg (ref 35.0–45.0)
pO2, Arterial: 80.1 mmHg (ref 80.0–100.0)

## 2013-10-28 LAB — HEMOGLOBIN A1C
Hgb A1c MFr Bld: 5.4 % (ref <5.7)
Mean Plasma Glucose: 108 mg/dL (ref <117)

## 2013-10-28 LAB — CBC
HCT: 38.8 % — ABNORMAL LOW (ref 39.0–52.0)
Hemoglobin: 12.9 g/dL — ABNORMAL LOW (ref 13.0–17.0)
MCH: 31.9 pg (ref 26.0–34.0)
MCHC: 33.2 g/dL (ref 30.0–36.0)
MCV: 96 fL (ref 78.0–100.0)
Platelets: 144 10*3/uL — ABNORMAL LOW (ref 150–400)
RBC: 4.04 MIL/uL — ABNORMAL LOW (ref 4.22–5.81)
RDW: 12.9 % (ref 11.5–15.5)
WBC: 4.3 10*3/uL (ref 4.0–10.5)

## 2013-10-28 LAB — APTT: aPTT: 124 seconds — ABNORMAL HIGH (ref 24–37)

## 2013-10-28 LAB — URINALYSIS, ROUTINE W REFLEX MICROSCOPIC
Bilirubin Urine: NEGATIVE
GLUCOSE, UA: NEGATIVE mg/dL
HGB URINE DIPSTICK: NEGATIVE
Ketones, ur: NEGATIVE mg/dL
Leukocytes, UA: NEGATIVE
Nitrite: NEGATIVE
Protein, ur: NEGATIVE mg/dL
Specific Gravity, Urine: 1.008 (ref 1.005–1.030)
Urobilinogen, UA: 1 mg/dL (ref 0.0–1.0)
pH: 6.5 (ref 5.0–8.0)

## 2013-10-28 LAB — COMPREHENSIVE METABOLIC PANEL
ALK PHOS: 73 U/L (ref 39–117)
ALT: 50 U/L (ref 0–53)
AST: 55 U/L — AB (ref 0–37)
Albumin: 3.3 g/dL — ABNORMAL LOW (ref 3.5–5.2)
Anion gap: 11 (ref 5–15)
BILIRUBIN TOTAL: 0.5 mg/dL (ref 0.3–1.2)
BUN: 4 mg/dL — ABNORMAL LOW (ref 6–23)
CHLORIDE: 100 meq/L (ref 96–112)
CO2: 27 meq/L (ref 19–32)
Calcium: 9.4 mg/dL (ref 8.4–10.5)
Creatinine, Ser: 0.82 mg/dL (ref 0.50–1.35)
GFR calc Af Amer: >90 mL/min (ref >90)
GLUCOSE: 103 mg/dL — AB (ref 70–99)
Potassium: 3.9 mEq/L (ref 3.7–5.3)
SODIUM: 138 meq/L (ref 137–147)
Total Protein: 7.1 g/dL (ref 6.0–8.3)

## 2013-10-28 LAB — ABO/RH: ABO/RH(D): A POS

## 2013-10-28 LAB — TYPE AND SCREEN
ABO/RH(D): A POS
ANTIBODY SCREEN: NEGATIVE

## 2013-10-28 LAB — HEPARIN LEVEL (UNFRACTIONATED): Heparin Unfractionated: 0.35 IU/mL (ref 0.30–0.70)

## 2013-10-28 MED ORDER — SODIUM CHLORIDE 0.9 % IV SOLN
INTRAVENOUS | Status: AC
  Administered 2013-10-29: 69.8 mL/h via INTRAVENOUS
  Filled 2013-10-28: qty 40

## 2013-10-28 MED ORDER — METOPROLOL TARTRATE 12.5 MG HALF TABLET
12.5000 mg | ORAL_TABLET | Freq: Once | ORAL | Status: AC
  Administered 2013-10-29: 12.5 mg via ORAL
  Filled 2013-10-28: qty 1

## 2013-10-28 MED ORDER — CHLORHEXIDINE GLUCONATE CLOTH 2 % EX PADS
6.0000 | MEDICATED_PAD | Freq: Once | CUTANEOUS | Status: AC
  Administered 2013-10-29: 6 via TOPICAL

## 2013-10-28 MED ORDER — PHENYLEPHRINE HCL 10 MG/ML IJ SOLN
30.0000 ug/min | INTRAVENOUS | Status: DC
  Filled 2013-10-28: qty 2

## 2013-10-28 MED ORDER — VANCOMYCIN HCL 10 G IV SOLR
1500.0000 mg | INTRAVENOUS | Status: AC
  Administered 2013-10-29: 1500 mg via INTRAVENOUS
  Filled 2013-10-28: qty 1500

## 2013-10-28 MED ORDER — DEXMEDETOMIDINE HCL IN NACL 400 MCG/100ML IV SOLN
0.1000 ug/kg/h | INTRAVENOUS | Status: AC
  Administered 2013-10-29: 0.4 ug/kg/h via INTRAVENOUS
  Filled 2013-10-28: qty 100

## 2013-10-28 MED ORDER — NITROGLYCERIN IN D5W 200-5 MCG/ML-% IV SOLN
2.0000 ug/min | INTRAVENOUS | Status: AC
  Administered 2013-10-29: 5 ug/min via INTRAVENOUS
  Filled 2013-10-28: qty 250

## 2013-10-28 MED ORDER — POTASSIUM CHLORIDE 2 MEQ/ML IV SOLN
80.0000 meq | INTRAVENOUS | Status: DC
  Filled 2013-10-28: qty 40

## 2013-10-28 MED ORDER — SODIUM CHLORIDE 0.9 % IV SOLN
INTRAVENOUS | Status: DC
  Filled 2013-10-28: qty 30

## 2013-10-28 MED ORDER — MAGNESIUM SULFATE 50 % IJ SOLN
40.0000 meq | INTRAMUSCULAR | Status: DC
  Filled 2013-10-28: qty 10

## 2013-10-28 MED ORDER — DIAZEPAM 5 MG PO TABS
5.0000 mg | ORAL_TABLET | Freq: Once | ORAL | Status: AC
  Administered 2013-10-29: 5 mg via ORAL
  Filled 2013-10-28: qty 1

## 2013-10-28 MED ORDER — DOPAMINE-DEXTROSE 3.2-5 MG/ML-% IV SOLN
2.0000 ug/kg/min | INTRAVENOUS | Status: DC
  Filled 2013-10-28: qty 250

## 2013-10-28 MED ORDER — TEMAZEPAM 15 MG PO CAPS: 15.0000 mg | ORAL_CAPSULE | Freq: Once | ORAL | Status: AC | PRN

## 2013-10-28 MED ORDER — HEPARIN (PORCINE) IN NACL 100-0.45 UNIT/ML-% IJ SOLN
1600.0000 [IU]/h | INTRAMUSCULAR | Status: DC
  Administered 2013-10-28: 1600 [IU]/h via INTRAVENOUS
  Filled 2013-10-28 (×5): qty 250

## 2013-10-28 MED ORDER — DEXTROSE 5 % IV SOLN
1.5000 g | INTRAVENOUS | Status: AC
  Administered 2013-10-29: .75 g via INTRAVENOUS
  Administered 2013-10-29: 1.5 g via INTRAVENOUS
  Filled 2013-10-28: qty 1.5

## 2013-10-28 MED ORDER — CHLORHEXIDINE GLUCONATE CLOTH 2 % EX PADS
6.0000 | MEDICATED_PAD | Freq: Once | CUTANEOUS | Status: AC
  Administered 2013-10-28: 6 via TOPICAL

## 2013-10-28 MED ORDER — SODIUM CHLORIDE 0.9 % IV SOLN
INTRAVENOUS | Status: AC
  Administered 2013-10-29: 1 [IU]/h via INTRAVENOUS
  Filled 2013-10-28: qty 2.5

## 2013-10-28 MED ORDER — DEXTROSE 5 % IV SOLN
750.0000 mg | INTRAVENOUS | Status: DC
  Filled 2013-10-28: qty 750

## 2013-10-28 MED ORDER — EPINEPHRINE HCL 1 MG/ML IJ SOLN
0.5000 ug/min | INTRAVENOUS | Status: DC
  Filled 2013-10-28: qty 4

## 2013-10-28 MED ORDER — BISACODYL 5 MG PO TBEC: 5.0000 mg | DELAYED_RELEASE_TABLET | Freq: Once | ORAL | Status: DC

## 2013-10-28 MED ORDER — PLASMA-LYTE 148 IV SOLN
INTRAVENOUS | Status: AC
  Administered 2013-10-29: 09:00:00
  Filled 2013-10-28: qty 2.5

## 2013-10-28 NOTE — Progress Notes (Signed)
3 Days Post-Op Procedure(s) (LRB): LEFT HEART CATHETERIZATION WITH CORONARY ANGIOGRAM (N/A) Subjective:  No chest pain or shortness of breath today. Up in bathroom cleaning up without difficulty  Objective: Vital signs in last 24 hours: Temp:  [97.7 F (36.5 C)-98.4 F (36.9 C)] 98.4 F (36.9 C) (08/20 1130) Pulse Rate:  [68] 68 (08/19 2321) Cardiac Rhythm:  [-] Normal sinus rhythm (08/20 0900) Resp:  [13-22] 22 (08/20 1130) BP: (120-166)/(62-91) 120/77 mmHg (08/20 1130) SpO2:  [96 %-100 %] 98 % (08/20 1130)  Hemodynamic parameters for last 24 hours:    Intake/Output from previous day: 08/19 0701 - 08/20 0700 In: 2183.1 [P.O.:1800; I.V.:383.1] Out: 0762 [Urine:3450] Intake/Output this shift: Total I/O In: 303 [P.O.:240; I.V.:63] Out: 450 [Urine:450]  General appearance: alert and cooperative Heart: regular rate and rhythm, S1, S2 normal, no murmur, click, rub or gallop Lungs: clear to auscultation bilaterally  Lab Results:  Recent Labs  10/27/13 0244 10/28/13 0420  WBC 4.7 4.3  HGB 13.5 12.9*  HCT 39.3 38.8*  PLT 143* 144*   BMET: No results found for this basename: NA, K, CL, CO2, GLUCOSE, BUN, CREATININE, CALCIUM,  in the last 72 hours  PT/INR: No results found for this basename: LABPROT, INR,  in the last 72 hours ABG No results found for this basename: phart, pco2, po2, hco3, tco2, acidbasedef, o2sat   CBG (last 3)  No results found for this basename: GLUCAP,  in the last 72 hours  Assessment/Plan:   Severe multi-vessel coronary disease. Plan CABG in am.  LOS: 3 days    BARTLE,BRYAN K 10/28/2013

## 2013-10-28 NOTE — Telephone Encounter (Signed)
I spoke with [employer] and they will refax request for prior authorization in regards to Pantoprazole (changed Rx to Dr Burt Knack). The pt is currently hospitalized so I will have to wait until he is discharged before I can complete prior authorization.

## 2013-10-28 NOTE — Progress Notes (Signed)
Patient Name: Gregory Hansen Date of Encounter: 10/28/2013  Active Problems:   Intermediate coronary syndrome   Length of Stay: 3  SUBJECTIVE  No chest pain today  CURRENT MEDS . aspirin EC  81 mg Oral Daily  . atorvastatin  80 mg Oral QHS  . buPROPion  300 mg Oral Daily  . Chlorhexidine Gluconate Cloth  6 each Topical Q0600  . fluticasone  2 spray Each Nare Daily  . isosorbide mononitrate  60 mg Oral Daily  . lisinopril  40 mg Oral Daily  . loratadine  10 mg Oral Daily  . metoprolol succinate  50 mg Oral Daily  . mupirocin ointment  1 application Nasal BID  . nefazodone  300 mg Oral QHS  . pantoprazole  20 mg Oral BID  . sodium chloride  3 mL Intravenous Q12H   OBJECTIVE  Filed Vitals:   10/28/13 0000 10/28/13 0405 10/28/13 0714 10/28/13 1130  BP: 159/74 160/91 166/87 120/77  Pulse:      Temp:  98.2 F (36.8 C) 98.4 F (36.9 C) 98.4 F (36.9 C)  TempSrc:  Oral Oral Oral  Resp: 19 18 19 22   Height:      Weight:      SpO2:  98% 96% 98%    Intake/Output Summary (Last 24 hours) at 10/28/13 1206 Last data filed at 10/28/13 1100  Gross per 24 hour  Intake 1440.62 ml  Output   2450 ml  Net -1009.38 ml   Filed Weights   10/25/13 0829 10/25/13 1300  Weight: 245 lb (111.131 kg) 245 lb (111.131 kg)   PHYSICAL EXAM  General: Pleasant, NAD. Neuro: Alert and oriented X 3. Moves all extremities spontaneously. Psych: Normal affect. HEENT:  Normal  Neck: Supple without bruits or JVD. Lungs:  Resp regular and unlabored, CTA. Heart: RRR no s3, s4, or murmurs. Abdomen: Soft, non-tender, non-distended, BS + x 4.  Extremities: No clubbing, cyanosis or edema. DP/PT/Radials 2+ and equal bilaterally.  Accessory Clinical Findings  CBC  Recent Labs  10/27/13 0244 10/28/13 0420  WBC 4.7 4.3  HGB 13.5 12.9*  HCT 39.3 38.8*  MCV 96.3 96.0  PLT 143* 540*   Basic Metabolic Panel No results found for this basename: NA, K, CL, CO2, GLUCOSE, BUN, CREATININE,  CALCIUM, MG, PHOS,  in the last 72 hours Radiology/Studies  No results found.  TELE: SR  ECG:   Left cardiac cath: 10/25/2013  Final Conclusions:  1. Severe left main disease  2. Severe proximal RCA stenosis (in-stent)  3. Severe proximal LAD stenosis  4. Patent LCx  5. Normal LV systolic function  Recommendations: The patient has an accelerating pattern of angina. He had a high-risk nuclear scan and has high-risk coronary anatomy. I have recommended hospital admission and IV heparin for anticoagulation. Cardiac surgical evaluation will be obtained. P2Y12 testing this AM demonstrated significant platelet inhibition with PRU of 114. Recommend recheck of PRU in 48-72 hours.     ASSESSMENT AND PLAN  63 year old gentleman with known CAD, multiple PCIs in the past who was admitted for accelerated angina   1. CAD - cath showed severe LM disease, severe prox LAD disease and ISR in prox RCA. He was on Plavix with significant platelet inhibition, PRU 114 --> 207. Continue iv Heparin, aspirin, metoprolol, ACEI. CABG scheduled for tomorrow. LVEF 53% with apical hypokinesis. No signs of CHF, the patient is euvolemic.   2. HTN - better controlled after increasing lisinopril and imdur  3. Hyperlipidemia -  increase atorvastatin to 80 mg po daily   Signed, Dorothy Spark MD, [facility] 10/28/2013

## 2013-10-28 NOTE — Progress Notes (Signed)
ANTICOAGULATION CONSULT NOTE - Follow Up Consult  Pharmacy Consult for Heparin Indication: chest pain/ACS  No Known Allergies  Patient Measurements: Height: 6\' 2"  (188 cm) Weight: 245 lb (111.131 kg) IBW/kg (Calculated) : 82.2 Heparin Dosing Weight: 105kg  Vital Signs: Temp: 98.4 F (36.9 C) (08/20 0714) Temp src: Oral (08/20 0714) BP: 166/87 mmHg (08/20 0714) Pulse Rate: 68 (08/19 2321)  Labs:  Recent Labs  10/25/13 0837 10/26/13 0145  10/26/13 1635 10/27/13 0244 10/27/13 0944 10/28/13 0420  HGB 14.9 13.1  --   --  13.5  --  12.9*  HCT 42.8 38.5*  --   --  39.3  --  38.8*  PLT 185 157  --   --  143*  --  144*  LABPROT 14.3  --   --   --   --   --   --   INR 1.11  --   --   --   --   --   --   HEPARINUNFRC  --  0.11*  < > 0.70  --  0.71* 0.35  CREATININE 0.84  --   --   --   --   --   --   < > = values in this interval not displayed.  Estimated Creatinine Clearance: 117.9 ml/min (by C-G formula based on Cr of 0.84).   Medications:  Heparin @ 1550 units/hr  Assessment: 63yom s/p cath 8/17 found to have severe left main disease, severe proximal RCA instent stenosis, and severe proximal LAD stenosis. He continues on heparin with plans for CABG tomorrow. Heparin level is therapeutic but on the low end. CBC stable. No bleeding reported.  Goal of Therapy:  Heparin level 0.3-0.7 units/ml Monitor platelets by anticoagulation protocol: Yes   Plan:  1) Increase heparin to 1600 units/hr  2) Follow up after CABG tomorrow  Deboraha Sprang 10/28/2013,8:36 AM

## 2013-10-29 ENCOUNTER — Encounter (HOSPITAL_COMMUNITY)
Admission: RE | Disposition: A | Discharge status: Home / Self Care | Payer: BC Managed Care – PPO | Source: Ambulatory Visit | Attending: Surgery

## 2013-10-29 ENCOUNTER — Inpatient Hospital Stay (HOSPITAL_COMMUNITY): Payer: BC Managed Care – PPO

## 2013-10-29 ENCOUNTER — Encounter (HOSPITAL_COMMUNITY): Payer: Self-pay | Admitting: Certified Registered Nurse Anesthetist

## 2013-10-29 ENCOUNTER — Encounter (HOSPITAL_COMMUNITY): Payer: BC Managed Care – PPO | Admitting: Anesthesiology

## 2013-10-29 ENCOUNTER — Inpatient Hospital Stay (HOSPITAL_COMMUNITY): Payer: BC Managed Care – PPO | Admitting: Anesthesiology

## 2013-10-29 DIAGNOSIS — Z951 Presence of aortocoronary bypass graft: Secondary | ICD-10-CM

## 2013-10-29 DIAGNOSIS — I2510 Atherosclerotic heart disease of native coronary artery without angina pectoris: Secondary | ICD-10-CM

## 2013-10-29 HISTORY — PX: INTRAOPERATIVE TRANSESOPHAGEAL ECHOCARDIOGRAM: SHX5062

## 2013-10-29 HISTORY — PX: CORONARY ARTERY BYPASS GRAFT: SHX141

## 2013-10-29 LAB — PLATELET COUNT: Platelets: 111 10*3/uL — ABNORMAL LOW (ref 150–400)

## 2013-10-29 LAB — POCT I-STAT, CHEM 8
BUN: <3 mg/dL — ABNORMAL LOW (ref 6–23)
BUN: <3 mg/dL — ABNORMAL LOW (ref 6–23)
BUN: <3 mg/dL — ABNORMAL LOW (ref 6–23)
BUN: <3 mg/dL — ABNORMAL LOW (ref 6–23)
CALCIUM ION: 1.19 mmol/L (ref 1.13–1.30)
CALCIUM ION: 1.17 mmol/L (ref 1.13–1.30)
CALCIUM ION: 1.01 mmol/L — AB (ref 1.13–1.30)
CALCIUM ION: 1.08 mmol/L — AB (ref 1.13–1.30)
CHLORIDE: 107 meq/L (ref 96–112)
CHLORIDE: 109 meq/L (ref 96–112)
CHLORIDE: 106 meq/L (ref 96–112)
CREATININE: 0.3 mg/dL — AB (ref 0.50–1.35)
Calcium, Ion: 0.82 mmol/L — ABNORMAL LOW (ref 1.13–1.30)
Calcium, Ion: 1.04 mmol/L — ABNORMAL LOW (ref 1.13–1.30)
Calcium, Ion: 1.2 mmol/L (ref 1.13–1.30)
Chloride: 107 mEq/L (ref 96–112)
Chloride: 112 mEq/L (ref 96–112)
Chloride: 100 mEq/L (ref 96–112)
Chloride: 98 mEq/L (ref 96–112)
Creatinine, Ser: 0.5 mg/dL (ref 0.50–1.35)
Creatinine, Ser: 0.5 mg/dL (ref 0.50–1.35)
Creatinine, Ser: 0.6 mg/dL (ref 0.50–1.35)
Creatinine, Ser: 0.5 mg/dL (ref 0.50–1.35)
Creatinine, Ser: 0.6 mg/dL (ref 0.50–1.35)
Creatinine, Ser: 0.6 mg/dL (ref 0.50–1.35)
GLUCOSE: 105 mg/dL — AB (ref 70–99)
GLUCOSE: 104 mg/dL — AB (ref 70–99)
Glucose, Bld: 91 mg/dL (ref 70–99)
Glucose, Bld: 101 mg/dL — ABNORMAL HIGH (ref 70–99)
Glucose, Bld: 91 mg/dL (ref 70–99)
Glucose, Bld: 131 mg/dL — ABNORMAL HIGH (ref 70–99)
Glucose, Bld: 103 mg/dL — ABNORMAL HIGH (ref 70–99)
HCT: 36 % — ABNORMAL LOW (ref 39.0–52.0)
HCT: 25 % — ABNORMAL LOW (ref 39.0–52.0)
HCT: 26 % — ABNORMAL LOW (ref 39.0–52.0)
HCT: 32 % — ABNORMAL LOW (ref 39.0–52.0)
HEMATOCRIT: 24 % — AB (ref 39.0–52.0)
HEMATOCRIT: 26 % — AB (ref 39.0–52.0)
HEMATOCRIT: 36 % — AB (ref 39.0–52.0)
HEMOGLOBIN: 8.2 g/dL — AB (ref 13.0–17.0)
Hemoglobin: 12.2 g/dL — ABNORMAL LOW (ref 13.0–17.0)
Hemoglobin: 10.9 g/dL — ABNORMAL LOW (ref 13.0–17.0)
Hemoglobin: 8.5 g/dL — ABNORMAL LOW (ref 13.0–17.0)
Hemoglobin: 8.8 g/dL — ABNORMAL LOW (ref 13.0–17.0)
Hemoglobin: 8.8 g/dL — ABNORMAL LOW (ref 13.0–17.0)
Hemoglobin: 12.2 g/dL — ABNORMAL LOW (ref 13.0–17.0)
POTASSIUM: 3.5 meq/L — AB (ref 3.7–5.3)
POTASSIUM: 3.8 meq/L (ref 3.7–5.3)
Potassium: 3.4 mEq/L — ABNORMAL LOW (ref 3.7–5.3)
Potassium: 3.2 mEq/L — ABNORMAL LOW (ref 3.7–5.3)
Potassium: 3.9 mEq/L (ref 3.7–5.3)
Potassium: 4 mEq/L (ref 3.7–5.3)
Potassium: 3.7 mEq/L (ref 3.7–5.3)
SODIUM: 143 meq/L (ref 137–147)
Sodium: 135 mEq/L — ABNORMAL LOW (ref 137–147)
Sodium: 135 mEq/L — ABNORMAL LOW (ref 137–147)
Sodium: 136 mEq/L — ABNORMAL LOW (ref 137–147)
Sodium: 138 mEq/L (ref 137–147)
Sodium: 127 mEq/L — ABNORMAL LOW (ref 137–147)
Sodium: 135 mEq/L — ABNORMAL LOW (ref 137–147)
TCO2: 27 mmol/L (ref 0–100)
TCO2: 24 mmol/L (ref 0–100)
TCO2: 25 mmol/L (ref 0–100)
TCO2: 26 mmol/L (ref 0–100)
TCO2: 24 mmol/L (ref 0–100)
TCO2: 26 mmol/L (ref 0–100)
TCO2: 21 mmol/L (ref 0–100)

## 2013-10-29 LAB — POCT I-STAT 3, ART BLOOD GAS (G3+)
ACID-BASE DEFICIT: 1 mmol/L (ref 0.0–2.0)
Acid-Base Excess: 1 mmol/L (ref 0.0–2.0)
Acid-Base Excess: 3 mmol/L — ABNORMAL HIGH (ref 0.0–2.0)
Acid-base deficit: 1 mmol/L (ref 0.0–2.0)
Acid-base deficit: 2 mmol/L (ref 0.0–2.0)
BICARBONATE: 26.7 meq/L — AB (ref 20.0–24.0)
BICARBONATE: 24.1 meq/L — AB (ref 20.0–24.0)
Bicarbonate: 26.8 mEq/L — ABNORMAL HIGH (ref 20.0–24.0)
Bicarbonate: 24.6 mEq/L — ABNORMAL HIGH (ref 20.0–24.0)
Bicarbonate: 24 mEq/L (ref 20.0–24.0)
O2 SAT: 100 %
O2 SAT: 95 %
O2 SAT: 98 %
O2 Saturation: 100 %
O2 Saturation: 95 %
PCO2 ART: 47 mmHg — AB (ref 35.0–45.0)
PCO2 ART: 37.2 mmHg (ref 35.0–45.0)
PCO2 ART: 38.5 mmHg (ref 35.0–45.0)
PCO2 ART: 44.9 mmHg (ref 35.0–45.0)
PCO2 ART: 45.8 mmHg — AB (ref 35.0–45.0)
PH ART: 7.362 (ref 7.350–7.450)
PH ART: 7.348 — AB (ref 7.350–7.450)
PH ART: 7.326 — AB (ref 7.350–7.450)
PO2 ART: 71 mmHg — AB (ref 80.0–100.0)
PO2 ART: 80 mmHg (ref 80.0–100.0)
Patient temperature: 35.9
Patient temperature: 37.2
Patient temperature: 37
TCO2: 28 mmol/L (ref 0–100)
TCO2: 28 mmol/L (ref 0–100)
TCO2: 25 mmol/L (ref 0–100)
TCO2: 26 mmol/L (ref 0–100)
TCO2: 25 mmol/L (ref 0–100)
pH, Arterial: 7.467 — ABNORMAL HIGH (ref 7.350–7.450)
pH, Arterial: 7.4 (ref 7.350–7.450)
pO2, Arterial: 392 mmHg — ABNORMAL HIGH (ref 80.0–100.0)
pO2, Arterial: 469 mmHg — ABNORMAL HIGH (ref 80.0–100.0)
pO2, Arterial: 107 mmHg — ABNORMAL HIGH (ref 80.0–100.0)

## 2013-10-29 LAB — CBC
HCT: 43.1 % (ref 39.0–52.0)
HEMATOCRIT: 31.2 % — AB (ref 39.0–52.0)
HEMATOCRIT: 34.4 % — AB (ref 39.0–52.0)
Hemoglobin: 14.6 g/dL (ref 13.0–17.0)
Hemoglobin: 11.2 g/dL — ABNORMAL LOW (ref 13.0–17.0)
Hemoglobin: 11.8 g/dL — ABNORMAL LOW (ref 13.0–17.0)
MCH: 32.9 pg (ref 26.0–34.0)
MCH: 33.9 pg (ref 26.0–34.0)
MCH: 33 pg (ref 26.0–34.0)
MCHC: 33.9 g/dL (ref 30.0–36.0)
MCHC: 35.9 g/dL (ref 30.0–36.0)
MCHC: 34.3 g/dL (ref 30.0–36.0)
MCV: 97.1 fL (ref 78.0–100.0)
MCV: 94.5 fL (ref 78.0–100.0)
MCV: 96.1 fL (ref 78.0–100.0)
Platelets: 153 10*3/uL (ref 150–400)
Platelets: 91 10*3/uL — ABNORMAL LOW (ref 150–400)
Platelets: 113 10*3/uL — ABNORMAL LOW (ref 150–400)
RBC: 4.44 MIL/uL (ref 4.22–5.81)
RBC: 3.3 MIL/uL — ABNORMAL LOW (ref 4.22–5.81)
RBC: 3.58 MIL/uL — ABNORMAL LOW (ref 4.22–5.81)
RDW: 13 % (ref 11.5–15.5)
RDW: 13 % (ref 11.5–15.5)
RDW: 13.3 % (ref 11.5–15.5)
WBC: 6 10*3/uL (ref 4.0–10.5)
WBC: 7.9 10*3/uL (ref 4.0–10.5)
WBC: 9 10*3/uL (ref 4.0–10.5)

## 2013-10-29 LAB — BASIC METABOLIC PANEL
Anion gap: 13 (ref 5–15)
BUN: 4 mg/dL — ABNORMAL LOW (ref 6–23)
CALCIUM: 9.3 mg/dL (ref 8.4–10.5)
CO2: 24 mEq/L (ref 19–32)
Chloride: 104 mEq/L (ref 96–112)
Creatinine, Ser: 0.76 mg/dL (ref 0.50–1.35)
GFR calc Af Amer: >90 mL/min (ref >90)
GFR calc non Af Amer: >90 mL/min (ref >90)
Glucose, Bld: 86 mg/dL (ref 70–99)
Potassium: 3.7 mEq/L (ref 3.7–5.3)
SODIUM: 141 meq/L (ref 137–147)

## 2013-10-29 LAB — POCT I-STAT 4, (NA,K, GLUC, HGB,HCT)
GLUCOSE: 103 mg/dL — AB (ref 70–99)
HEMATOCRIT: 35 % — AB (ref 39.0–52.0)
Hemoglobin: 11.9 g/dL — ABNORMAL LOW (ref 13.0–17.0)
POTASSIUM: 3.4 meq/L — AB (ref 3.7–5.3)
Sodium: 141 mEq/L (ref 137–147)

## 2013-10-29 LAB — PROTIME-INR
INR: 1.36 (ref 0.00–1.49)
Prothrombin Time: 16.8 seconds — ABNORMAL HIGH (ref 11.6–15.2)

## 2013-10-29 LAB — MAGNESIUM: Magnesium: 2.8 mg/dL — ABNORMAL HIGH (ref 1.5–2.5)

## 2013-10-29 LAB — CREATININE, SERUM
CREATININE: 0.68 mg/dL (ref 0.50–1.35)
GFR calc Af Amer: >90 mL/min (ref >90)
GFR calc non Af Amer: >90 mL/min (ref >90)

## 2013-10-29 LAB — HEMOGLOBIN AND HEMATOCRIT, BLOOD
HEMATOCRIT: 27.1 % — AB (ref 39.0–52.0)
Hemoglobin: 9.4 g/dL — ABNORMAL LOW (ref 13.0–17.0)

## 2013-10-29 LAB — APTT: aPTT: 38 seconds — ABNORMAL HIGH (ref 24–37)

## 2013-10-29 SURGERY — CORONARY ARTERY BYPASS GRAFTING (CABG)
Anesthesia: Monitor Anesthesia Care | Site: Chest

## 2013-10-29 MED ORDER — HEPARIN SODIUM (PORCINE) 1000 UNIT/ML IJ SOLN
INTRAMUSCULAR | Status: AC
  Filled 2013-10-29: qty 2

## 2013-10-29 MED ORDER — BISACODYL 5 MG PO TBEC
10.0000 mg | DELAYED_RELEASE_TABLET | Freq: Every day | ORAL | Status: DC
  Administered 2013-10-30 – 2013-10-31 (×2): 10 mg via ORAL
  Filled 2013-10-29 (×2): qty 2

## 2013-10-29 MED ORDER — OXYCODONE HCL 5 MG PO TABS
5.0000 mg | ORAL_TABLET | ORAL | Status: DC | PRN
  Administered 2013-10-30 – 2013-10-31 (×3): 10 mg via ORAL
  Filled 2013-10-29 (×3): qty 2

## 2013-10-29 MED ORDER — PROTAMINE SULFATE 10 MG/ML IV SOLN
INTRAVENOUS | Status: DC | PRN
  Administered 2013-10-29: 320 mg via INTRAVENOUS

## 2013-10-29 MED ORDER — FENTANYL CITRATE 0.05 MG/ML IJ SOLN
INTRAMUSCULAR | Status: AC
  Filled 2013-10-29: qty 5

## 2013-10-29 MED ORDER — VANCOMYCIN HCL IN DEXTROSE 1-5 GM/200ML-% IV SOLN
1000.0000 mg | Freq: Once | INTRAVENOUS | Status: AC
  Administered 2013-10-29: 1000 mg via INTRAVENOUS
  Filled 2013-10-29: qty 200

## 2013-10-29 MED ORDER — NITROGLYCERIN IN D5W 200-5 MCG/ML-% IV SOLN: 0.0000 ug/min | INTRAVENOUS | Status: DC

## 2013-10-29 MED ORDER — ALBUMIN HUMAN 5 % IV SOLN
INTRAVENOUS | Status: DC | PRN
  Administered 2013-10-29: 12:00:00 via INTRAVENOUS

## 2013-10-29 MED ORDER — PHENYLEPHRINE 40 MCG/ML (10ML) SYRINGE FOR IV PUSH (FOR BLOOD PRESSURE SUPPORT)
PREFILLED_SYRINGE | INTRAVENOUS | Status: AC
  Filled 2013-10-29: qty 10

## 2013-10-29 MED ORDER — ACETAMINOPHEN 160 MG/5ML PO SOLN: 650.0000 mg | Freq: Once | ORAL | Status: AC

## 2013-10-29 MED ORDER — LIDOCAINE HCL (CARDIAC) 20 MG/ML IV SOLN
INTRAVENOUS | Status: AC
  Filled 2013-10-29: qty 5

## 2013-10-29 MED ORDER — PROPOFOL 10 MG/ML IV BOLUS
INTRAVENOUS | Status: DC | PRN
  Administered 2013-10-29: 100 mg via INTRAVENOUS

## 2013-10-29 MED ORDER — ACETAMINOPHEN 500 MG PO TABS
1000.0000 mg | ORAL_TABLET | Freq: Four times a day (QID) | ORAL | Status: DC
  Administered 2013-10-30 – 2013-10-31 (×8): 1000 mg via ORAL
  Filled 2013-10-29 (×10): qty 2

## 2013-10-29 MED ORDER — SODIUM CHLORIDE 0.9 % IV SOLN
INTRAVENOUS | Status: DC
  Filled 2013-10-29: qty 2.5

## 2013-10-29 MED ORDER — HEMOSTATIC AGENTS (NO CHARGE) OPTIME
TOPICAL | Status: DC | PRN
  Administered 2013-10-29: 1 via TOPICAL
  Administered 2013-10-29: 3 via TOPICAL

## 2013-10-29 MED ORDER — METOPROLOL TARTRATE 12.5 MG HALF TABLET
12.5000 mg | ORAL_TABLET | Freq: Two times a day (BID) | ORAL | Status: DC
  Administered 2013-10-30 – 2013-10-31 (×3): 12.5 mg via ORAL
  Filled 2013-10-29 (×5): qty 1

## 2013-10-29 MED ORDER — MAGNESIUM SULFATE 4000MG/100ML IJ SOLN
4.0000 g | Freq: Once | INTRAMUSCULAR | Status: AC
  Administered 2013-10-29: 4 g via INTRAVENOUS
  Filled 2013-10-29: qty 100

## 2013-10-29 MED ORDER — ASPIRIN EC 325 MG PO TBEC
325.0000 mg | DELAYED_RELEASE_TABLET | Freq: Every day | ORAL | Status: DC
  Administered 2013-10-30 – 2013-10-31 (×2): 325 mg via ORAL
  Filled 2013-10-29 (×2): qty 1

## 2013-10-29 MED ORDER — DOCUSATE SODIUM 100 MG PO CAPS
200.0000 mg | ORAL_CAPSULE | Freq: Every day | ORAL | Status: DC
  Administered 2013-10-30 – 2013-10-31 (×2): 200 mg via ORAL
  Filled 2013-10-29 (×2): qty 2

## 2013-10-29 MED ORDER — ASPIRIN 81 MG PO CHEW: 324.0000 mg | CHEWABLE_TABLET | Freq: Every day | ORAL | Status: DC

## 2013-10-29 MED ORDER — FAMOTIDINE IN NACL 20-0.9 MG/50ML-% IV SOLN
20.0000 mg | Freq: Two times a day (BID) | INTRAVENOUS | Status: AC
  Administered 2013-10-29: 20 mg via INTRAVENOUS

## 2013-10-29 MED ORDER — METOPROLOL TARTRATE 25 MG/10 ML ORAL SUSPENSION
12.5000 mg | Freq: Two times a day (BID) | ORAL | Status: DC
  Filled 2013-10-29 (×5): qty 5

## 2013-10-29 MED ORDER — HYDRALAZINE HCL 20 MG/ML IJ SOLN
INTRAMUSCULAR | Status: DC | PRN
  Administered 2013-10-29 (×2): 4 mg via INTRAVENOUS

## 2013-10-29 MED ORDER — ALBUMIN HUMAN 5 % IV SOLN
250.0000 mL | INTRAVENOUS | Status: AC | PRN
  Administered 2013-10-29 (×2): 250 mL via INTRAVENOUS

## 2013-10-29 MED ORDER — MIDAZOLAM HCL 2 MG/2ML IJ SOLN
INTRAMUSCULAR | Status: AC
  Filled 2013-10-29: qty 2

## 2013-10-29 MED ORDER — LACTATED RINGERS IV SOLN
INTRAVENOUS | Status: DC | PRN
  Administered 2013-10-29 (×4): via INTRAVENOUS

## 2013-10-29 MED ORDER — ROCURONIUM BROMIDE 100 MG/10ML IV SOLN
INTRAVENOUS | Status: DC | PRN
  Administered 2013-10-29: 50 mg via INTRAVENOUS

## 2013-10-29 MED ORDER — DEXMEDETOMIDINE HCL IN NACL 200 MCG/50ML IV SOLN
0.1000 ug/kg/h | INTRAVENOUS | Status: DC
  Administered 2013-10-29 (×2): 0.5 ug/kg/h via INTRAVENOUS
  Filled 2013-10-29 (×2): qty 50

## 2013-10-29 MED ORDER — MIDAZOLAM HCL 2 MG/2ML IJ SOLN: 2.0000 mg | INTRAMUSCULAR | Status: DC | PRN

## 2013-10-29 MED ORDER — NITROGLYCERIN 0.2 MG/ML ON CALL CATH LAB
INTRAVENOUS | Status: DC | PRN
  Administered 2013-10-29: 80 ug via INTRAVENOUS

## 2013-10-29 MED ORDER — THROMBIN 20000 UNITS EX SOLR
CUTANEOUS | Status: AC
  Filled 2013-10-29: qty 20000

## 2013-10-29 MED ORDER — ACETAMINOPHEN 160 MG/5ML PO SOLN
1000.0000 mg | Freq: Four times a day (QID) | ORAL | Status: DC
  Filled 2013-10-29: qty 40

## 2013-10-29 MED ORDER — MORPHINE SULFATE 2 MG/ML IJ SOLN
2.0000 mg | INTRAMUSCULAR | Status: DC | PRN
  Administered 2013-10-29: 2 mg via INTRAVENOUS
  Administered 2013-10-29: 4 mg via INTRAVENOUS
  Administered 2013-10-29 – 2013-10-30 (×2): 2 mg via INTRAVENOUS
  Administered 2013-10-30: 4 mg via INTRAVENOUS
  Filled 2013-10-29: qty 1
  Filled 2013-10-29 (×2): qty 2
  Filled 2013-10-29 (×3): qty 1

## 2013-10-29 MED ORDER — ROCURONIUM BROMIDE 50 MG/5ML IV SOLN
INTRAVENOUS | Status: AC
  Filled 2013-10-29: qty 1

## 2013-10-29 MED ORDER — DEXTROSE 5 % IV SOLN
1.5000 g | Freq: Two times a day (BID) | INTRAVENOUS | Status: AC
  Administered 2013-10-29 – 2013-10-31 (×4): 1.5 g via INTRAVENOUS
  Filled 2013-10-29 (×4): qty 1.5

## 2013-10-29 MED ORDER — ONDANSETRON HCL 4 MG/2ML IJ SOLN: 4.0000 mg | Freq: Four times a day (QID) | INTRAMUSCULAR | Status: DC | PRN

## 2013-10-29 MED ORDER — LACTATED RINGERS IV SOLN: 500.0000 mL | Freq: Once | INTRAVENOUS | Status: AC | PRN

## 2013-10-29 MED ORDER — ARTIFICIAL TEARS OP OINT
TOPICAL_OINTMENT | OPHTHALMIC | Status: DC | PRN
  Administered 2013-10-29: 1 via OPHTHALMIC

## 2013-10-29 MED ORDER — ARTIFICIAL TEARS OP OINT
TOPICAL_OINTMENT | OPHTHALMIC | Status: AC
  Filled 2013-10-29: qty 3.5

## 2013-10-29 MED ORDER — LACTATED RINGERS IV SOLN: INTRAVENOUS | Status: DC

## 2013-10-29 MED ORDER — POTASSIUM CHLORIDE 10 MEQ/50ML IV SOLN
10.0000 meq | INTRAVENOUS | Status: AC
  Administered 2013-10-29 (×3): 10 meq via INTRAVENOUS

## 2013-10-29 MED ORDER — BISACODYL 10 MG RE SUPP
10.0000 mg | Freq: Every day | RECTAL | Status: DC
Start: 2013-10-30 — End: 2013-10-31

## 2013-10-29 MED ORDER — SODIUM CHLORIDE 0.9 % IJ SOLN: 3.0000 mL | INTRAMUSCULAR | Status: DC | PRN

## 2013-10-29 MED ORDER — PANTOPRAZOLE SODIUM 40 MG PO TBEC
40.0000 mg | DELAYED_RELEASE_TABLET | Freq: Every day | ORAL | Status: DC
  Administered 2013-10-31: 40 mg via ORAL
  Filled 2013-10-29: qty 1

## 2013-10-29 MED ORDER — MORPHINE SULFATE 2 MG/ML IJ SOLN
1.0000 mg | INTRAMUSCULAR | Status: AC | PRN
  Administered 2013-10-29 (×2): 2 mg via INTRAVENOUS
  Filled 2013-10-29: qty 1

## 2013-10-29 MED ORDER — CETYLPYRIDINIUM CHLORIDE 0.05 % MT LIQD
7.0000 mL | Freq: Two times a day (BID) | OROMUCOSAL | Status: DC
  Administered 2013-10-30 (×2): 7 mL via OROMUCOSAL

## 2013-10-29 MED ORDER — HEPARIN SODIUM (PORCINE) 1000 UNIT/ML IJ SOLN
INTRAMUSCULAR | Status: DC | PRN
  Administered 2013-10-29: 32000 [IU] via INTRAVENOUS

## 2013-10-29 MED ORDER — SODIUM CHLORIDE 0.9 % IJ SOLN
3.0000 mL | Freq: Two times a day (BID) | INTRAMUSCULAR | Status: DC
  Administered 2013-10-30 – 2013-10-31 (×2): 10 mL via INTRAVENOUS

## 2013-10-29 MED ORDER — SODIUM CHLORIDE 0.9 % IV SOLN
INTRAVENOUS | Status: DC
  Administered 2013-10-29 – 2013-10-30 (×2): 10 mL/h via INTRAVENOUS

## 2013-10-29 MED ORDER — VECURONIUM BROMIDE 10 MG IV SOLR
INTRAVENOUS | Status: AC
  Filled 2013-10-29: qty 10

## 2013-10-29 MED ORDER — SODIUM CHLORIDE 0.45 % IV SOLN
INTRAVENOUS | Status: DC
  Administered 2013-10-29: 20 mL/h via INTRAVENOUS

## 2013-10-29 MED ORDER — MIDAZOLAM HCL 10 MG/2ML IJ SOLN
INTRAMUSCULAR | Status: AC
  Filled 2013-10-29: qty 2

## 2013-10-29 MED ORDER — THROMBIN 20000 UNITS EX SOLR
CUTANEOUS | Status: DC | PRN
  Administered 2013-10-29: 20000 [IU] via TOPICAL

## 2013-10-29 MED ORDER — FENTANYL CITRATE 0.05 MG/ML IJ SOLN
INTRAMUSCULAR | Status: DC | PRN
  Administered 2013-10-29 (×2): 50 ug via INTRAVENOUS
  Administered 2013-10-29: 100 ug via INTRAVENOUS
  Administered 2013-10-29: 50 ug via INTRAVENOUS
  Administered 2013-10-29: 250 ug via INTRAVENOUS
  Administered 2013-10-29: 100 ug via INTRAVENOUS
  Administered 2013-10-29 (×2): 50 ug via INTRAVENOUS
  Administered 2013-10-29: 250 ug via INTRAVENOUS
  Administered 2013-10-29: 150 ug via INTRAVENOUS
  Administered 2013-10-29 (×2): 250 ug via INTRAVENOUS
  Administered 2013-10-29 (×3): 100 ug via INTRAVENOUS
  Administered 2013-10-29: 250 ug via INTRAVENOUS
  Administered 2013-10-29: 100 ug via INTRAVENOUS

## 2013-10-29 MED ORDER — VECURONIUM BROMIDE 10 MG IV SOLR
INTRAVENOUS | Status: DC | PRN
  Administered 2013-10-29 (×4): 5 mg via INTRAVENOUS

## 2013-10-29 MED ORDER — PROTAMINE SULFATE 10 MG/ML IV SOLN
INTRAVENOUS | Status: AC
  Filled 2013-10-29: qty 25

## 2013-10-29 MED ORDER — INSULIN REGULAR BOLUS VIA INFUSION
0.0000 [IU] | Freq: Three times a day (TID) | INTRAVENOUS | Status: DC
  Filled 2013-10-29: qty 10

## 2013-10-29 MED ORDER — PROTAMINE SULFATE 10 MG/ML IV SOLN
INTRAVENOUS | Status: AC
  Filled 2013-10-29: qty 10

## 2013-10-29 MED ORDER — MIDAZOLAM HCL 5 MG/5ML IJ SOLN
INTRAMUSCULAR | Status: DC | PRN
  Administered 2013-10-29: 1 mg via INTRAVENOUS
  Administered 2013-10-29 (×3): 2 mg via INTRAVENOUS
  Administered 2013-10-29: 1 mg via INTRAVENOUS
  Administered 2013-10-29: 2 mg via INTRAVENOUS

## 2013-10-29 MED ORDER — HEPARIN SODIUM (PORCINE) 1000 UNIT/ML IJ SOLN
INTRAMUSCULAR | Status: AC
  Filled 2013-10-29: qty 1

## 2013-10-29 MED ORDER — METOPROLOL TARTRATE 1 MG/ML IV SOLN: 2.5000 mg | INTRAVENOUS | Status: DC | PRN

## 2013-10-29 MED ORDER — SODIUM CHLORIDE 0.9 % IV SOLN: 250.0000 mL | INTRAVENOUS | Status: DC

## 2013-10-29 MED ORDER — GLYCOPYRROLATE 0.2 MG/ML IJ SOLN
INTRAMUSCULAR | Status: DC | PRN
  Administered 2013-10-29: 0.2 mg via INTRAVENOUS

## 2013-10-29 MED ORDER — PHENYLEPHRINE HCL 10 MG/ML IJ SOLN
0.0000 ug/min | INTRAVENOUS | Status: DC
  Filled 2013-10-29: qty 2

## 2013-10-29 MED ORDER — ACETAMINOPHEN 650 MG RE SUPP
650.0000 mg | Freq: Once | RECTAL | Status: AC
  Administered 2013-10-29: 650 mg via RECTAL

## 2013-10-29 MED ORDER — METOCLOPRAMIDE HCL 5 MG/ML IJ SOLN
10.0000 mg | Freq: Four times a day (QID) | INTRAMUSCULAR | Status: AC
  Administered 2013-10-29 – 2013-10-30 (×4): 10 mg via INTRAVENOUS
  Filled 2013-10-29 (×3): qty 2

## 2013-10-29 MED ORDER — PROPOFOL 10 MG/ML IV BOLUS
INTRAVENOUS | Status: AC
  Filled 2013-10-29: qty 20

## 2013-10-29 MED FILL — Mannitol IV Soln 20%: INTRAVENOUS | Qty: 500 | Status: AC

## 2013-10-29 MED FILL — Electrolyte-R (PH 7.4) Solution: INTRAVENOUS | Qty: 3000 | Status: AC

## 2013-10-29 MED FILL — Heparin Sodium (Porcine) Inj 1000 Unit/ML: INTRAMUSCULAR | Qty: 30 | Status: AC

## 2013-10-29 MED FILL — Sodium Bicarbonate IV Soln 8.4%: INTRAVENOUS | Qty: 50 | Status: AC

## 2013-10-29 MED FILL — Lidocaine HCl IV Inj 20 MG/ML: INTRAVENOUS | Qty: 5 | Status: AC

## 2013-10-29 MED FILL — Sodium Chloride IV Soln 0.9%: INTRAVENOUS | Qty: 2000 | Status: AC

## 2013-10-29 SURGICAL SUPPLY — 108 items
APL SKNCLS STERI-STRIP NONHPOA (GAUZE/BANDAGES/DRESSINGS) ×2
ATTRACTOMAT 16X20 MAGNETIC DRP (DRAPES) ×4 IMPLANT
BAG DECANTER FOR FLEXI CONT (MISCELLANEOUS) ×4 IMPLANT
BANDAGE ELASTIC 4 VELCRO ST LF (GAUZE/BANDAGES/DRESSINGS) ×4 IMPLANT
BANDAGE ELASTIC 6 VELCRO ST LF (GAUZE/BANDAGES/DRESSINGS) ×4 IMPLANT
BASKET HEART  (ORDER IN 25'S) (MISCELLANEOUS) ×1
BASKET HEART (ORDER IN 25'S) (MISCELLANEOUS) ×1
BASKET HEART (ORDER IN 25S) (MISCELLANEOUS) ×2 IMPLANT
BENZOIN TINCTURE PRP APPL 2/3 (GAUZE/BANDAGES/DRESSINGS) ×2 IMPLANT
BLADE STERNUM SYSTEM 6 (BLADE) ×4 IMPLANT
BLADE SURG 11 STRL SS (BLADE) ×2 IMPLANT
BNDG GAUZE ELAST 4 BULKY (GAUZE/BANDAGES/DRESSINGS) ×4 IMPLANT
CANISTER SUCTION 2500CC (MISCELLANEOUS) ×4 IMPLANT
CANNULA ARTERIAL NVNT 3/8 22FR (MISCELLANEOUS) ×2 IMPLANT
CARDIAC SUCTION (MISCELLANEOUS) ×4 IMPLANT
CATH ROBINSON RED A/P 18FR (CATHETERS) ×8 IMPLANT
CATH THORACIC 28FR (CATHETERS) ×4 IMPLANT
CATH THORACIC 36FR (CATHETERS) ×4 IMPLANT
CATH THORACIC 36FR RT ANG (CATHETERS) ×4 IMPLANT
CLIP TI MEDIUM 24 (CLIP) IMPLANT
CLIP TI WIDE RED SMALL 24 (CLIP) IMPLANT
CLOSURE STERI-STRIP 1/2X4 (GAUZE/BANDAGES/DRESSINGS) ×1
CLSR STERI-STRIP ANTIMIC 1/2X4 (GAUZE/BANDAGES/DRESSINGS) ×2 IMPLANT
COVER SURGICAL LIGHT HANDLE (MISCELLANEOUS) ×4 IMPLANT
CRADLE DONUT ADULT HEAD (MISCELLANEOUS) ×4 IMPLANT
DRAPE CARDIOVASCULAR INCISE (DRAPES) ×4
DRAPE SLUSH/WARMER DISC (DRAPES) ×4 IMPLANT
DRAPE SRG 135X102X78XABS (DRAPES) ×2 IMPLANT
DRSG COVADERM 4X14 (GAUZE/BANDAGES/DRESSINGS) ×4 IMPLANT
ELECT CAUTERY BLADE 6.4 (BLADE) ×4 IMPLANT
ELECT REM PT RETURN 9FT ADLT (ELECTROSURGICAL) ×8
ELECTRODE REM PT RTRN 9FT ADLT (ELECTROSURGICAL) ×4 IMPLANT
GAUZE SPONGE 4X4 12PLY STRL (GAUZE/BANDAGES/DRESSINGS) ×8 IMPLANT
GLOVE BIO SURGEON STRL SZ 6 (GLOVE) ×2 IMPLANT
GLOVE BIO SURGEON STRL SZ 6.5 (GLOVE) ×1 IMPLANT
GLOVE BIO SURGEON STRL SZ7 (GLOVE) IMPLANT
GLOVE BIO SURGEON STRL SZ7.5 (GLOVE) IMPLANT
GLOVE BIO SURGEONS STRL SZ 6.5 (GLOVE) ×1
GLOVE BIOGEL PI IND STRL 6 (GLOVE) IMPLANT
GLOVE BIOGEL PI IND STRL 6.5 (GLOVE) IMPLANT
GLOVE BIOGEL PI IND STRL 7.0 (GLOVE) ×1 IMPLANT
GLOVE BIOGEL PI INDICATOR 6 (GLOVE) ×2
GLOVE BIOGEL PI INDICATOR 6.5 (GLOVE) ×4
GLOVE BIOGEL PI INDICATOR 7.0 (GLOVE) ×2
GLOVE EUDERMIC 7 POWDERFREE (GLOVE) ×10 IMPLANT
GLOVE ORTHO TXT STRL SZ7.5 (GLOVE) IMPLANT
GOWN STRL REUS W/ TWL LRG LVL3 (GOWN DISPOSABLE) ×8 IMPLANT
GOWN STRL REUS W/ TWL XL LVL3 (GOWN DISPOSABLE) ×2 IMPLANT
GOWN STRL REUS W/TWL LRG LVL3 (GOWN DISPOSABLE) ×16
GOWN STRL REUS W/TWL XL LVL3 (GOWN DISPOSABLE) ×4
HEMOSTAT POWDER SURGIFOAM 1G (HEMOSTASIS) ×12 IMPLANT
HEMOSTAT SURGICEL 2X14 (HEMOSTASIS) ×4 IMPLANT
INSERT FOGARTY 61MM (MISCELLANEOUS) IMPLANT
INSERT FOGARTY XLG (MISCELLANEOUS) IMPLANT
KIT BASIN OR (CUSTOM PROCEDURE TRAY) ×4 IMPLANT
KIT CATH CPB BARTLE (MISCELLANEOUS) ×4 IMPLANT
KIT ROOM TURNOVER OR (KITS) ×4 IMPLANT
KIT SUCTION CATH 14FR (SUCTIONS) ×6 IMPLANT
KIT VASOVIEW 6 PRO VH 2400 (KITS) ×2 IMPLANT
KIT VASOVIEW W/TROCAR VH 2000 (KITS) ×4 IMPLANT
NS IRRIG 1000ML POUR BTL (IV SOLUTION) ×20 IMPLANT
PACK OPEN HEART (CUSTOM PROCEDURE TRAY) ×4 IMPLANT
PAD ARMBOARD 7.5X6 YLW CONV (MISCELLANEOUS) ×8 IMPLANT
PAD ELECT DEFIB RADIOL ZOLL (MISCELLANEOUS) ×4 IMPLANT
PENCIL BUTTON HOLSTER BLD 10FT (ELECTRODE) ×4 IMPLANT
PUNCH AORTIC ROTATE 4.0MM (MISCELLANEOUS) IMPLANT
PUNCH AORTIC ROTATE 4.5MM 8IN (MISCELLANEOUS) ×4 IMPLANT
PUNCH AORTIC ROTATE 5MM 8IN (MISCELLANEOUS) IMPLANT
SET CARDIOPLEGIA MPS 5001102 (MISCELLANEOUS) ×2 IMPLANT
SPONGE GAUZE 4X4 12PLY STER LF (GAUZE/BANDAGES/DRESSINGS) ×4 IMPLANT
SPONGE INTESTINAL PEANUT (DISPOSABLE) IMPLANT
SPONGE LAP 18X18 X RAY DECT (DISPOSABLE) IMPLANT
SPONGE LAP 4X18 X RAY DECT (DISPOSABLE) ×4 IMPLANT
SUT BONE WAX W31G (SUTURE) ×4 IMPLANT
SUT MNCRL AB 4-0 PS2 18 (SUTURE) ×2 IMPLANT
SUT PROLENE 3 0 SH DA (SUTURE) IMPLANT
SUT PROLENE 3 0 SH1 36 (SUTURE) ×4 IMPLANT
SUT PROLENE 4 0 RB 1 (SUTURE)
SUT PROLENE 4 0 SH DA (SUTURE) IMPLANT
SUT PROLENE 4-0 RB1 .5 CRCL 36 (SUTURE) IMPLANT
SUT PROLENE 5 0 C 1 36 (SUTURE) IMPLANT
SUT PROLENE 6 0 C 1 30 (SUTURE) IMPLANT
SUT PROLENE 7 0 BV 1 (SUTURE) ×2 IMPLANT
SUT PROLENE 7 0 BV1 MDA (SUTURE) ×4 IMPLANT
SUT PROLENE 8 0 BV175 6 (SUTURE) IMPLANT
SUT SILK  1 MH (SUTURE) ×4
SUT SILK 1 MH (SUTURE) IMPLANT
SUT STEEL STERNAL CCS#1 18IN (SUTURE) IMPLANT
SUT STEEL SZ 6 DBL 3X14 BALL (SUTURE) ×6 IMPLANT
SUT VIC AB 1 CTX 36 (SUTURE) ×12
SUT VIC AB 1 CTX36XBRD ANBCTR (SUTURE) ×4 IMPLANT
SUT VIC AB 2-0 CT1 27 (SUTURE) ×4
SUT VIC AB 2-0 CT1 TAPERPNT 27 (SUTURE) ×2 IMPLANT
SUT VIC AB 2-0 CTX 27 (SUTURE) IMPLANT
SUT VIC AB 3-0 SH 27 (SUTURE)
SUT VIC AB 3-0 SH 27X BRD (SUTURE) IMPLANT
SUT VIC AB 3-0 X1 27 (SUTURE) IMPLANT
SUT VICRYL 4-0 PS2 18IN ABS (SUTURE) IMPLANT
SUTURE E-PAK OPEN HEART (SUTURE) ×4 IMPLANT
SYSTEM SAHARA CHEST DRAIN ATS (WOUND CARE) ×4 IMPLANT
TAPE CLOTH SURG 4X10 WHT LF (GAUZE/BANDAGES/DRESSINGS) ×4 IMPLANT
TOWEL OR 17X24 6PK STRL BLUE (TOWEL DISPOSABLE) ×4 IMPLANT
TOWEL OR 17X26 10 PK STRL BLUE (TOWEL DISPOSABLE) ×4 IMPLANT
TRAY FOLEY IC TEMP SENS 16FR (CATHETERS) ×4 IMPLANT
TUBING INSUFFLATION (TUBING) ×2 IMPLANT
TUBING INSUFFLATION 10FT LAP (TUBING) ×4 IMPLANT
UNDERPAD 30X30 INCONTINENT (UNDERPADS AND DIAPERS) ×4 IMPLANT
WATER STERILE IRR 1000ML POUR (IV SOLUTION) ×8 IMPLANT

## 2013-10-29 NOTE — Progress Notes (Signed)
Patient ID: Gregory Hansen, male   DOB: [birth year], 63 y.o.   MRN: [id number]  SICU Evening Rounds:   Hemodynamically stable  CI = 2.5  Extubated  Urine output good  CT output low  CBC    Component Value Date/Time   WBC 9.0 10/29/2013 1845   RBC 3.58* 10/29/2013 1845   HGB 12.2* 10/29/2013 1852   HCT 36.0* 10/29/2013 1852   PLT 113* 10/29/2013 1845   MCV 96.1 10/29/2013 1845   MCH 33.0 10/29/2013 1845   MCHC 34.3 10/29/2013 1845   RDW 13.3 10/29/2013 1845   LYMPHSABS 1.7 03/25/2013 1041   MONOABS 0.9 03/25/2013 1041   EOSABS 0.4 03/25/2013 1041   BASOSABS 0.1 03/25/2013 1041     BMET    Component Value Date/Time   NA 143 10/29/2013 1852   K 3.8 10/29/2013 1852   CL 106 10/29/2013 1852   CO2 24 10/29/2013 0309   GLUCOSE 103* 10/29/2013 1852   BUN <3* 10/29/2013 1852   CREATININE 0.60 10/29/2013 1852   CREATININE 0.80 09/30/2011 1656   CALCIUM 9.3 10/29/2013 0309   GFRNONAA >90 10/29/2013 0309   GFRAA >90 10/29/2013 0309     A/P:  Stable postop course. Continue current plans

## 2013-10-29 NOTE — Brief Op Note (Signed)
10/25/2013 - 10/29/2013  11:07 AM  PATIENT:  Gregory Hansen  63 y.o. male  PRE-OPERATIVE DIAGNOSIS:  Coronary Artery Disease  POST-OPERATIVE DIAGNOSIS:  Coronary Artery Disease  PROCEDURE:   CORONARY ARTERY BYPASS GRAFTING x 4 (LIMA-LAD, SVG-OM, SVG-PD-PL) ENDOSCOPIC VEIN HARVEST RIGHT THIGH  SURGEON:  Surgeon(s): Gaye Pollack, MD  ASSISTANT: Suzzanne Cloud, PA-C  ANESTHESIA:   general  PATIENT CONDITION:  ICU - intubated and hemodynamically stable.  PRE-OPERATIVE WEIGHT: 108 kg

## 2013-10-29 NOTE — Anesthesia Postprocedure Evaluation (Signed)
  Anesthesia Post-op Note  Patient: Gregory Hansen  Procedure(s) Performed: Procedure(s): CORONARY ARTERY BYPASS GRAFTING x 4 (LIMA-LAD, SVG-OM, SVG-PD-PL) ENDOSCOPIC VEIN HARVEST RIGHT THIGH (N/A) INTRAOPERATIVE TRANSESOPHAGEAL ECHOCARDIOGRAM (N/A)  Patient Location: SICU  Anesthesia Type:General  Level of Consciousness: sedated, unresponsive and Patient remains intubated per anesthesia plan  Airway and Oxygen Therapy: Patient remains intubated per anesthesia plan and Patient placed on Ventilator (see vital sign flow sheet for setting)  Post-op Pain: none  Post-op Assessment: Post-op Vital signs reviewed and Patient's Cardiovascular Status Stable  Post-op Vital Signs: stable  Last Vitals:  Filed Vitals:   10/29/13 1304  BP: 99/43  Pulse: 70  Temp:   Resp: 12    Complications: No apparent anesthesia complications

## 2013-10-29 NOTE — Op Note (Signed)
CARDIOVASCULAR SURGERY OPERATIVE NOTE  10/29/2013  Surgeon:  Gaye Pollack, MD  First Assistant: Suzzanne Cloud,  PA-C   Preoperative Diagnosis:  Severe multi-vessel coronary artery disease   Postoperative Diagnosis:  Same   Procedure:  1. Median Sternotomy 2. Extracorporeal circulation 3.   Coronary artery bypass grafting x 4   Left internal mammary graft to the LAD  SVG to OM  Sequential SVG to PDA and PL  4.   Endoscopic vein harvest from the right leg   Anesthesia:  General Endotracheal   Clinical History/Surgical Indication:  The patient is a 63 year old gentleman with a history of hyperlipidemia, hypertension and a strong family history of heart disease who has undergone multiple stenting procedures for coronary disease in the past. He suffered an inferior MI in 2002 and underwent initial stenting of the RCA at that time. He has had multiple PCI's since then with the last one in January 2015 for severe stenosis in the ostium of the RCA treated with a DES. He now reports progressive exertional chest discomfort over the past few months occuring with walking any distance, riding his bike or mowing the lawn. He has had no symptoms at rest. Cardiac cath today shows 90-95% proximal LM stenosis. The LAD has proximal 75% stenosis and 50-60% mid vessel stenosis. The LCX is widely patent. The RCA has 80-90% proximal stenosis within the ostial and proximal stented segment. LVEF is 55-65%. He has been on Plavix chronically with a P2Y12 level on presentation of 114. It was necessary to wait for the Plavix to wash out. He has high grade left main and multi-vessel coronary artery disease with progressive anginal symptoms. I agree with the need for coronary artery bypass graft surgery to prevent further ischemia and infarction and sudden death.I discussed the operative procedure with the patient and  family including alternatives, benefits and risks; including but not limited to bleeding, blood transfusion, infection, stroke, myocardial infarction, graft failure, heart block requiring a permanent pacemaker, organ dysfunction, and death. Dillard Essex Billard understands and agrees to proceed.    Preparation:  The patient was seen in the preoperative holding area and the correct patient, correct operation were confirmed with the patient after reviewing the medical record and catheterization. The consent was signed by me. Preoperative antibiotics were given. A pulmonary arterial line and radial arterial line were placed by the anesthesia team. The patient was taken back to the operating room and positioned supine on the operating room table. After being placed under general endotracheal anesthesia by the anesthesia team a foley catheter was placed. The neck, chest, abdomen, and both legs were prepped with betadine soap and solution and draped in the usual sterile manner. A surgical time-out was taken and the correct patient and operative procedure were confirmed with the nursing and anesthesia staff.   Cardiopulmonary Bypass:  A median sternotomy was performed. The pericardium was opened in the midline. Right ventricular function appeared normal. The ascending aorta was of normal size and had no palpable plaque. There were no contraindications to aortic cannulation or cross-clamping. The patient was fully systemically heparinized and the ACT was maintained > 400 sec. The proximal aortic arch was cannulated with a 36 F aortic cannula for arterial inflow. Venous cannulation was performed via the right atrial appendage using a two-staged venous cannula. An antegrade cardioplegia/vent cannula was inserted into the mid-ascending aorta. Aortic occlusion was performed with a single cross-clamp. Systemic cooling to 32 degrees Centigrade and topical cooling of the heart  with iced saline were used. Hyperkalemic  antegrade cold blood cardioplegia was used to induce diastolic arrest and was then given at about 20 minute intervals throughout the period of arrest to maintain myocardial temperature at or below 10 degrees centigrade. A temperature probe was inserted into the interventricular septum and an insulating pad was placed in the pericardium.   Left internal mammary harvest:  The left side of the sternum was retracted using the Rultract retractor. The left internal mammary artery was harvested as a pedicle graft. All side branches were clipped. It was a medium-sized vessel of good quality with excellent blood flow. It was ligated distally and divided. It was sprayed with topical papaverine solution to prevent vasospasm.   Endoscopic vein harvest:  The right greater saphenous vein was harvested endoscopically through a 2 cm incision medial to the right knee. It was harvested from the upper thigh to below the knee. It was a medium-sized vein of good quality. The side branches were all ligated with 4-0 silk ties.    Coronary arteries:  The coronary arteries were examined.   LAD:  Moderate sized vessel with mild distal disease  LCX:  The OM is a large vessel with minimal distal disease  RCA:  The PDA is large with no disease visible. The PL is small but graftable. There appeared to be a lesion between the PDA and PL on cath.   Grafts:  1. LIMA to the LAD: 1.75 mm. It was sewn end to side using 8-0 prolene continuous suture. 2. SVG to OM:  1.75 mm. It was sewn end to side using 7-0 prolene continuous suture. 3. Sequential SVG to PDA :  1.75 mm. It was sewn sequential side to side using 7-0 prolene continuous suture. 4. Sequential SVG to PL:  1.5 mm. It was sewn sequential end to side using 7-0 prolene continuous suture.  The proximal vein graft anastomoses were performed to the mid-ascending aorta using continuous 6-0 prolene suture. Graft markers were placed around the proximal  anastomoses.   Completion:  The patient was rewarmed to 37 degrees Centigrade. The clamp was removed from the LIMA pedicle and there was rapid warming of the septum and return of ventricular fibrillation. The crossclamp was removed with a time of 83 minutes. There was spontaneous return of sinus rhythm. The distal and proximal anastomoses were checked for hemostasis. The position of the grafts was satisfactory. Two temporary epicardial pacing wires were placed on the right atrium and two on the right ventricle. The patient was weaned from CPB without difficulty on no inotropes. CPB time was 98 minutes. Cardiac output was 6 LPM. Heparin was fully reversed with protamine and the aortic and venous cannulas removed. Hemostasis was achieved. Mediastinal and left pleural drainage tubes were placed. The sternum was closed with double #6 stainless steel wires. The fascia was closed with continuous # 1 vicryl suture. The subcutaneous tissue was closed with 2-0 vicryl continuous suture. The skin was closed with 3-0 vicryl subcuticular suture. All sponge, needle, and instrument counts were reported correct at the end of the case. Dry sterile dressings were placed over the incisions and around the chest tubes which were connected to pleurevac suction. The patient was then transported to the surgical intensive care unit in critical but stable condition.

## 2013-10-29 NOTE — Transfer of Care (Signed)
Immediate Anesthesia Transfer of Care Note  Patient: Gregory Hansen  Procedure(s) Performed: Procedure(s): CORONARY ARTERY BYPASS GRAFTING x 4 (LIMA-LAD, SVG-OM, SVG-PD-PL) ENDOSCOPIC VEIN HARVEST RIGHT THIGH (N/A) INTRAOPERATIVE TRANSESOPHAGEAL ECHOCARDIOGRAM (N/A)  Patient Location: ICU  Anesthesia Type:General  Level of Consciousness: sedated and Patient remains intubated per anesthesia plan  Airway & Oxygen Therapy: Patient remains intubated per anesthesia plan and Patient placed on Ventilator (see vital sign flow sheet for setting)  Post-op Assessment: Report given to PACU RN and Post -op Vital signs reviewed and stable  Post vital signs: Reviewed and stable  Complications: No apparent anesthesia complications

## 2013-10-29 NOTE — Anesthesia Procedure Notes (Addendum)
Procedure Name: Intubation Date/Time: 10/29/2013 7:53 AM Performed by: Ned Grace Pre-anesthesia Checklist: Patient identified, Emergency Drugs available, Suction available, Patient being monitored and Timeout performed Patient Re-evaluated:Patient Re-evaluated prior to inductionOxygen Delivery Method: Circle system utilized Preoxygenation: Pre-oxygenation with 100% oxygen Intubation Type: IV induction Ventilation: Mask ventilation without difficulty Laryngoscope Size: Mac and 4 Grade View: Grade I Tube type: Oral Tube size: 8.5 mm Number of attempts: 1 Airway Equipment and Method: Stylet Placement Confirmation: ETT inserted through vocal cords under direct vision,  positive ETCO2 and breath sounds checked- equal and bilateral Secured at: 23 cm Tube secured with: Tape Dental Injury: Teeth and Oropharynx as per pre-operative assessment  Comments: Head/neck positioned to patient comfort prior to induction, maintained neutral position throughout DL.

## 2013-10-29 NOTE — Progress Notes (Signed)
*  PRELIMINARY RESULTS* Echocardiogram Echocardiogram Transesophageal has been performed.  Gregory Hansen 10/29/2013, 8:35 AM

## 2013-10-29 NOTE — Anesthesia Preprocedure Evaluation (Signed)
Anesthesia Evaluation  Patient identified by MRN, date of birth, ID band Patient awake    Reviewed: Allergy & Precautions, H&P , NPO status , Patient's Chart, lab work & pertinent test results  Airway       Dental   Pulmonary former smoker,          Cardiovascular hypertension, + angina + CAD and + Past MI     Neuro/Psych Anxiety Depression    GI/Hepatic GERD-  ,  Endo/Other    Renal/GU      Musculoskeletal   Abdominal   Peds  Hematology   Anesthesia Other Findings   Reproductive/Obstetrics                           Anesthesia Physical Anesthesia Plan  ASA: III  Anesthesia Plan: MAC   Post-op Pain Management:    Induction: Intravenous  Airway Management Planned: Oral ETT  Additional Equipment: Arterial line, CVP, TEE and Ultrasound Guidance Line Placement  Intra-op Plan:   Post-operative Plan: Post-operative intubation/ventilation  Informed Consent: I have reviewed the patients History and Physical, chart, labs and discussed the procedure including the risks, benefits and alternatives for the proposed anesthesia with the patient or authorized representative who has indicated his/her understanding and acceptance.   Dental advisory given  Plan Discussed with: CRNA and Surgeon  Anesthesia Plan Comments:         Anesthesia Quick Evaluation

## 2013-10-29 NOTE — Progress Notes (Signed)
Patient ID: Gregory Hansen, male   DOB: [birth year], 63 y.o.   MRN: [id number]  SICU Evening Rounds:   Hemodynamically stable  CI = 2.5  Extubated  Urine output good  CT output low  CBC    Component Value Date/Time   WBC 7.9 10/29/2013 1330   RBC 3.30* 10/29/2013 1330   HGB 12.2* 10/29/2013 1852   HCT 36.0* 10/29/2013 1852   PLT 91* 10/29/2013 1330   MCV 94.5 10/29/2013 1330   MCH 33.9 10/29/2013 1330   MCHC 35.9 10/29/2013 1330   RDW 13.0 10/29/2013 1330   LYMPHSABS 1.7 03/25/2013 1041   MONOABS 0.9 03/25/2013 1041   EOSABS 0.4 03/25/2013 1041   BASOSABS 0.1 03/25/2013 1041     BMET    Component Value Date/Time   NA 143 10/29/2013 1852   K 3.8 10/29/2013 1852   CL 106 10/29/2013 1852   CO2 24 10/29/2013 0309   GLUCOSE 103* 10/29/2013 1852   BUN <3* 10/29/2013 1852   CREATININE 0.60 10/29/2013 1852   CREATININE 0.80 09/30/2011 1656   CALCIUM 9.3 10/29/2013 0309   GFRNONAA >90 10/29/2013 0309   GFRAA >90 10/29/2013 0309     A/P:  Stable postop course. Continue current plans

## 2013-10-29 NOTE — Procedures (Signed)
Extubation Procedure Note  Patient Details:   Name: Gregory Hansen DOB: 1950/08/15 MRN: [id number]   Airway Documentation:     Evaluation  O2 sats: stable throughout Complications: No apparent complications Patient did tolerate procedure well. Bilateral Breath Sounds: Clear;Diminished   Yes Patient tolerated rapid wean. NIF -60 and VC 1.1 L. Positive for cuff leak. Patient extubated to a 3 Lpm nasal cannula. No signs of stridor or dyspnea. Patient achieved 1257mL, five times with good patient effort. RN at bedside.  Myrtie Neither 10/29/2013, 6:11 PM

## 2013-10-29 NOTE — OR Nursing (Signed)
45 Minute call to SICU made @ 1141.

## 2013-10-30 ENCOUNTER — Inpatient Hospital Stay (HOSPITAL_COMMUNITY): Payer: BC Managed Care – PPO

## 2013-10-30 LAB — GLUCOSE, CAPILLARY
GLUCOSE-CAPILLARY: 105 mg/dL — AB (ref 70–99)
GLUCOSE-CAPILLARY: 105 mg/dL — AB (ref 70–99)
GLUCOSE-CAPILLARY: 107 mg/dL — AB (ref 70–99)
GLUCOSE-CAPILLARY: 94 mg/dL (ref 70–99)
Glucose-Capillary: 104 mg/dL — ABNORMAL HIGH (ref 70–99)
Glucose-Capillary: 106 mg/dL — ABNORMAL HIGH (ref 70–99)
Glucose-Capillary: 109 mg/dL — ABNORMAL HIGH (ref 70–99)
Glucose-Capillary: 109 mg/dL — ABNORMAL HIGH (ref 70–99)
Glucose-Capillary: 112 mg/dL — ABNORMAL HIGH (ref 70–99)
Glucose-Capillary: 121 mg/dL — ABNORMAL HIGH (ref 70–99)
Glucose-Capillary: 122 mg/dL — ABNORMAL HIGH (ref 70–99)
Glucose-Capillary: 103 mg/dL — ABNORMAL HIGH (ref 70–99)
Glucose-Capillary: 103 mg/dL — ABNORMAL HIGH (ref 70–99)
Glucose-Capillary: 98 mg/dL (ref 70–99)
Glucose-Capillary: 89 mg/dL (ref 70–99)
Glucose-Capillary: 140 mg/dL — ABNORMAL HIGH (ref 70–99)
Glucose-Capillary: 102 mg/dL — ABNORMAL HIGH (ref 70–99)
Glucose-Capillary: 148 mg/dL — ABNORMAL HIGH (ref 70–99)

## 2013-10-30 LAB — CBC
HCT: 33.5 % — ABNORMAL LOW (ref 39.0–52.0)
HCT: 34.3 % — ABNORMAL LOW (ref 39.0–52.0)
HEMOGLOBIN: 11.5 g/dL — AB (ref 13.0–17.0)
HEMOGLOBIN: 11.5 g/dL — AB (ref 13.0–17.0)
MCH: 33.5 pg (ref 26.0–34.0)
MCH: 33.2 pg (ref 26.0–34.0)
MCHC: 34.3 g/dL (ref 30.0–36.0)
MCHC: 33.5 g/dL (ref 30.0–36.0)
MCV: 97.7 fL (ref 78.0–100.0)
MCV: 99.1 fL (ref 78.0–100.0)
PLATELETS: 106 10*3/uL — AB (ref 150–400)
Platelets: 114 10*3/uL — ABNORMAL LOW (ref 150–400)
RBC: 3.43 MIL/uL — ABNORMAL LOW (ref 4.22–5.81)
RBC: 3.46 MIL/uL — AB (ref 4.22–5.81)
RDW: 13.8 % (ref 11.5–15.5)
RDW: 14 % (ref 11.5–15.5)
WBC: 10.2 10*3/uL (ref 4.0–10.5)
WBC: 10 10*3/uL (ref 4.0–10.5)

## 2013-10-30 LAB — POCT I-STAT, CHEM 8
BUN: 5 mg/dL — AB (ref 6–23)
CALCIUM ION: 1.19 mmol/L (ref 1.13–1.30)
Chloride: 99 mEq/L (ref 96–112)
Creatinine, Ser: 0.7 mg/dL (ref 0.50–1.35)
Glucose, Bld: 110 mg/dL — ABNORMAL HIGH (ref 70–99)
HCT: 36 % — ABNORMAL LOW (ref 39.0–52.0)
Hemoglobin: 12.2 g/dL — ABNORMAL LOW (ref 13.0–17.0)
Potassium: 4 mEq/L (ref 3.7–5.3)
Sodium: 137 mEq/L (ref 137–147)
TCO2: 24 mmol/L (ref 0–100)

## 2013-10-30 LAB — CREATININE, SERUM: CREATININE: 0.67 mg/dL (ref 0.50–1.35)

## 2013-10-30 LAB — BASIC METABOLIC PANEL
Anion gap: 12 (ref 5–15)
BUN: 6 mg/dL (ref 6–23)
CALCIUM: 8.1 mg/dL — AB (ref 8.4–10.5)
CO2: 23 mEq/L (ref 19–32)
Chloride: 108 mEq/L (ref 96–112)
Creatinine, Ser: 0.69 mg/dL (ref 0.50–1.35)
Glucose, Bld: 99 mg/dL (ref 70–99)
Potassium: 4.1 mEq/L (ref 3.7–5.3)
Sodium: 143 mEq/L (ref 137–147)

## 2013-10-30 LAB — MAGNESIUM
Magnesium: 2.3 mg/dL (ref 1.5–2.5)
Magnesium: 2.1 mg/dL (ref 1.5–2.5)

## 2013-10-30 MED ORDER — FUROSEMIDE 10 MG/ML IJ SOLN
40.0000 mg | Freq: Two times a day (BID) | INTRAMUSCULAR | Status: AC
  Administered 2013-10-30 – 2013-10-31 (×2): 40 mg via INTRAVENOUS
  Filled 2013-10-30: qty 4

## 2013-10-30 MED ORDER — INSULIN ASPART 100 UNIT/ML ~~LOC~~ SOLN: 0.0000 [IU] | SUBCUTANEOUS | Status: DC

## 2013-10-30 MED ORDER — FUROSEMIDE 10 MG/ML IJ SOLN
40.0000 mg | Freq: Once | INTRAMUSCULAR | Status: AC
  Administered 2013-10-30: 40 mg via INTRAVENOUS
  Filled 2013-10-30: qty 4

## 2013-10-30 MED ORDER — INSULIN ASPART 100 UNIT/ML ~~LOC~~ SOLN
0.0000 [IU] | SUBCUTANEOUS | Status: DC
  Administered 2013-10-30 (×2): 2 [IU] via SUBCUTANEOUS

## 2013-10-30 MED ORDER — POTASSIUM CHLORIDE CRYS ER 20 MEQ PO TBCR
20.0000 meq | EXTENDED_RELEASE_TABLET | Freq: Two times a day (BID) | ORAL | Status: AC
  Administered 2013-10-30 (×2): 20 meq via ORAL
  Filled 2013-10-30 (×2): qty 1

## 2013-10-30 MED ORDER — ENOXAPARIN SODIUM 40 MG/0.4ML ~~LOC~~ SOLN
40.0000 mg | Freq: Every day | SUBCUTANEOUS | Status: DC
  Administered 2013-10-30 – 2013-11-01 (×3): 40 mg via SUBCUTANEOUS
  Filled 2013-10-30 (×4): qty 0.4

## 2013-10-30 NOTE — Progress Notes (Signed)
1 Day Post-Op Procedure(s) (LRB): CORONARY ARTERY BYPASS GRAFTING x 4 (LIMA-LAD, SVG-OM, SVG-PD-PL) ENDOSCOPIC VEIN HARVEST RIGHT THIGH (N/A) INTRAOPERATIVE TRANSESOPHAGEAL ECHOCARDIOGRAM (N/A) Subjective: Only complaint is left back pain   Objective: Vital signs in last 24 hours: Temp:  [96.4 F (35.8 C)-99.9 F (37.7 C)] 98.4 F (36.9 C) (08/22 1100) Pulse Rate:  [70-97] 89 (08/22 1100) Cardiac Rhythm:  [-] Normal sinus rhythm (08/22 1000) Resp:  [12-27] 16 (08/22 1100) BP: (89-124)/(43-83) 95/55 mmHg (08/22 1100) SpO2:  [92 %-100 %] 95 % (08/22 1100) Arterial Line BP: (82-140)/(43-68) 100/50 mmHg (08/22 1100) FiO2 (%):  [40 %-50 %] 40 % (08/21 1725) Weight:  [112.356 kg (247 lb 11.2 oz)] 112.356 kg (247 lb 11.2 oz) (08/22 0500)  Hemodynamic parameters for last 24 hours: PAP: (18-42)/(8-26) 27/12 mmHg CO:  [4.8 L/min-6.8 L/min] 6.8 L/min CI:  [2.1 L/min/m2-2.9 L/min/m2] 2.9 L/min/m2  Intake/Output from previous day: 08/21 0701 - 08/22 0700 In: 4235.9 [I.V.:2505.9; Blood:400; NG/GT:30; IV MEQASTMHD:6222] Out: 9798 [Urine:4870; Blood:800; Chest Tube:570] Intake/Output this shift: Total I/O In: 609.5 [P.O.:360; I.V.:199.5; IV Piggyback:50] Out: 180 [Urine:120; Chest Tube:60]  General appearance: alert and cooperative Neurologic: intact Heart: regular rate and rhythm, S1, S2 normal, no murmur, click, rub or gallop Lungs: clear to auscultation bilaterally Extremities: edema mile Wound: dressings dry  Lab Results:  Recent Labs  10/29/13 1845 10/29/13 1852 10/30/13 0445  WBC 9.0  --  10.2  HGB 11.8* 12.2* 11.5*  HCT 34.4* 36.0* 33.5*  PLT 113*  --  114*   BMET:  Recent Labs  10/29/13 0309  10/29/13 1852 10/30/13 0445  NA 141  < > 143 143  K 3.7  < > 3.8 4.1  CL 104  < > 106 108  CO2 24  --   --  23  GLUCOSE 86  < > 103* 99  BUN 4*  < > <3* 6  CREATININE 0.76  < > 0.60 0.69  CALCIUM 9.3  --   --  8.1*  < > = values in this interval not displayed.   PT/INR:  Recent Labs  10/29/13 1330  LABPROT 16.8*  INR 1.36   ABG    Component Value Date/Time   PHART 7.326* 10/29/2013 1845   HCO3 24.0 10/29/2013 1845   TCO2 21 10/29/2013 1852   ACIDBASEDEF 2.0 10/29/2013 1845   O2SAT 95.0 10/29/2013 1845   CBG (last 3)   Recent Labs  10/30/13 0259 10/30/13 0354 10/30/13 0858  GLUCAP 98 94 89   CLINICAL DATA: Postop CABG  EXAM:  PORTABLE CHEST - 1 VIEW  COMPARISON: Prior chest x-ray 10/29/2013  FINDINGS:  Interval extubation and removal of nasogastric tube. Right IJ  vascular sheath continues to convey a Swan-Ganz catheter into the  heart. The tip of the Swan projects over the proximal right main  pulmonary artery. Mediastinal and left-sided chest tubes remain in  good position. No evidence of pneumothorax. Stable inspiratory  volumes and mild vascular congestion without overt edema. Persistent  left basilar opacity. No pneumothorax. Stable cardiac and  mediastinal contours.  IMPRESSION:  1. Interval extubation and removal of nasogastric tube.  2. Other support apparatus in stable and satisfactory position.  3. No significant interval change in the appearance of the lungs.  Electronically Signed  By: Jacqulynn Cadet M.D.  On: 10/30/2013 10:12  ECG: NSR 85, incomplete RBBB  Assessment/Plan: S/P Procedure(s) (LRB): CORONARY ARTERY BYPASS GRAFTING x 4 (LIMA-LAD, SVG-OM, SVG-PD-PL) ENDOSCOPIC VEIN HARVEST RIGHT THIGH (N/A) INTRAOPERATIVE TRANSESOPHAGEAL ECHOCARDIOGRAM (N/A) Mobilize  Diuresis Preop Hgb A1c was 5.4 Expected acute blood loss anemia Continue foley due to diuresing patient and patient in ICU See progression orders   LOS: 5 days    Temple Ewart K 10/30/2013

## 2013-10-30 NOTE — Progress Notes (Signed)
Patient ID: Gregory Hansen, male   DOB: 06/13/1950, 63 y.o.   MRN: [id number]  SICU Evening Rounds:  Hemodynamically stable  Diuresing  Walked well  Looks great

## 2013-10-31 ENCOUNTER — Inpatient Hospital Stay (HOSPITAL_COMMUNITY): Payer: BC Managed Care – PPO

## 2013-10-31 LAB — GLUCOSE, CAPILLARY
GLUCOSE-CAPILLARY: 112 mg/dL — AB (ref 70–99)
Glucose-Capillary: 90 mg/dL (ref 70–99)
Glucose-Capillary: 97 mg/dL (ref 70–99)
Glucose-Capillary: 99 mg/dL (ref 70–99)

## 2013-10-31 LAB — BASIC METABOLIC PANEL
Anion gap: 10 (ref 5–15)
BUN: 8 mg/dL (ref 6–23)
CHLORIDE: 101 meq/L (ref 96–112)
CO2: 27 meq/L (ref 19–32)
CREATININE: 0.69 mg/dL (ref 0.50–1.35)
Calcium: 8 mg/dL — ABNORMAL LOW (ref 8.4–10.5)
GFR calc Af Amer: >90 mL/min (ref >90)
GFR calc non Af Amer: >90 mL/min (ref >90)
Glucose, Bld: 93 mg/dL (ref 70–99)
Potassium: 3.7 mEq/L (ref 3.7–5.3)
Sodium: 138 mEq/L (ref 137–147)

## 2013-10-31 LAB — CBC
HEMATOCRIT: 31.2 % — AB (ref 39.0–52.0)
HEMOGLOBIN: 10.6 g/dL — AB (ref 13.0–17.0)
MCH: 33.2 pg (ref 26.0–34.0)
MCHC: 34 g/dL (ref 30.0–36.0)
MCV: 97.8 fL (ref 78.0–100.0)
Platelets: 103 10*3/uL — ABNORMAL LOW (ref 150–400)
RBC: 3.19 MIL/uL — ABNORMAL LOW (ref 4.22–5.81)
RDW: 13.6 % (ref 11.5–15.5)
WBC: 8.7 10*3/uL (ref 4.0–10.5)

## 2013-10-31 MED ORDER — BISACODYL 10 MG RE SUPP: 10.0000 mg | Freq: Every day | RECTAL | Status: DC | PRN

## 2013-10-31 MED ORDER — METOPROLOL TARTRATE 25 MG PO TABS
25.0000 mg | ORAL_TABLET | Freq: Two times a day (BID) | ORAL | Status: DC
  Administered 2013-10-31 – 2013-11-02 (×4): 25 mg via ORAL
  Filled 2013-10-31 (×5): qty 1

## 2013-10-31 MED ORDER — TRAMADOL HCL 50 MG PO TABS: 50.0000 mg | ORAL_TABLET | ORAL | Status: DC | PRN

## 2013-10-31 MED ORDER — DOCUSATE SODIUM 100 MG PO CAPS
200.0000 mg | ORAL_CAPSULE | Freq: Every day | ORAL | Status: DC
  Administered 2013-10-31 – 2013-11-02 (×3): 200 mg via ORAL
  Filled 2013-10-31 (×3): qty 2

## 2013-10-31 MED ORDER — ASPIRIN EC 325 MG PO TBEC
325.0000 mg | DELAYED_RELEASE_TABLET | Freq: Every day | ORAL | Status: DC
  Administered 2013-10-31 – 2013-11-02 (×3): 325 mg via ORAL
  Filled 2013-10-31 (×3): qty 1

## 2013-10-31 MED ORDER — PANTOPRAZOLE SODIUM 40 MG PO TBEC
40.0000 mg | DELAYED_RELEASE_TABLET | Freq: Every day | ORAL | Status: DC
  Administered 2013-11-01 – 2013-11-02 (×2): 40 mg via ORAL
  Filled 2013-10-31 (×2): qty 1

## 2013-10-31 MED ORDER — POTASSIUM CHLORIDE CRYS ER 20 MEQ PO TBCR
20.0000 meq | EXTENDED_RELEASE_TABLET | ORAL | Status: DC | PRN
  Administered 2013-10-31: 20 meq via ORAL
  Filled 2013-10-31: qty 1

## 2013-10-31 MED ORDER — BISACODYL 5 MG PO TBEC
10.0000 mg | DELAYED_RELEASE_TABLET | Freq: Every day | ORAL | Status: DC | PRN
  Filled 2013-10-31: qty 2

## 2013-10-31 MED ORDER — SODIUM CHLORIDE 0.9 % IJ SOLN: 3.0000 mL | INTRAMUSCULAR | Status: DC | PRN

## 2013-10-31 MED ORDER — SODIUM CHLORIDE 0.9 % IV SOLN: 250.0000 mL | INTRAVENOUS | Status: DC | PRN

## 2013-10-31 MED ORDER — POTASSIUM CHLORIDE CRYS ER 20 MEQ PO TBCR
20.0000 meq | EXTENDED_RELEASE_TABLET | Freq: Two times a day (BID) | ORAL | Status: DC
  Administered 2013-10-31 – 2013-11-02 (×4): 20 meq via ORAL
  Filled 2013-10-31 (×5): qty 1

## 2013-10-31 MED ORDER — FUROSEMIDE 40 MG PO TABS
40.0000 mg | ORAL_TABLET | Freq: Every day | ORAL | Status: DC
  Administered 2013-10-31: 40 mg via ORAL
  Filled 2013-10-31 (×2): qty 1

## 2013-10-31 MED ORDER — ACETAMINOPHEN 325 MG PO TABS: 650.0000 mg | ORAL_TABLET | Freq: Four times a day (QID) | ORAL | Status: DC | PRN

## 2013-10-31 MED ORDER — OXYCODONE HCL 5 MG PO TABS
5.0000 mg | ORAL_TABLET | ORAL | Status: DC | PRN
  Administered 2013-10-31 – 2013-11-02 (×6): 10 mg via ORAL
  Filled 2013-10-31 (×6): qty 2

## 2013-10-31 MED ORDER — MOVING RIGHT ALONG BOOK
Freq: Once | Status: AC
  Administered 2013-10-31: 19:00:00
  Filled 2013-10-31: qty 1

## 2013-10-31 MED ORDER — SODIUM CHLORIDE 0.9 % IJ SOLN
3.0000 mL | Freq: Two times a day (BID) | INTRAMUSCULAR | Status: DC
  Administered 2013-10-31 – 2013-11-01 (×2): 3 mL via INTRAVENOUS

## 2013-10-31 MED ORDER — ONDANSETRON HCL 4 MG/2ML IJ SOLN: 4.0000 mg | Freq: Four times a day (QID) | INTRAMUSCULAR | Status: DC | PRN

## 2013-10-31 MED ORDER — ONDANSETRON HCL 4 MG PO TABS: 4.0000 mg | ORAL_TABLET | Freq: Four times a day (QID) | ORAL | Status: DC | PRN

## 2013-10-31 NOTE — Progress Notes (Signed)
2 Days Post-Op Procedure(s) (LRB): CORONARY ARTERY BYPASS GRAFTING x 4 (LIMA-LAD, SVG-OM, SVG-PD-PL) ENDOSCOPIC VEIN HARVEST RIGHT THIGH (N/A) INTRAOPERATIVE TRANSESOPHAGEAL ECHOCARDIOGRAM (N/A) Subjective: No complaints. Walking very well  Objective: Vital signs in last 24 hours: Temp:  [97.7 F (36.5 C)-98.6 F (37 C)] 98.1 F (36.7 C) (08/23 0800) Pulse Rate:  [84-98] 96 (08/23 0900) Cardiac Rhythm:  [-] Normal sinus rhythm (08/23 0800) Resp:  [16-26] 22 (08/23 0900) BP: (80-137)/(49-78) 118/61 mmHg (08/23 0900) SpO2:  [93 %-100 %] 93 % (08/23 0900) Arterial Line BP: (100-138)/(50-68) 128/62 mmHg (08/22 1300) Weight:  [112.2 kg (247 lb 5.7 oz)] 112.2 kg (247 lb 5.7 oz) (08/23 0500)  Hemodynamic parameters for last 24 hours: PAP: (27-31)/(12-14) 31/14 mmHg  Intake/Output from previous day: 08/22 0701 - 08/23 0700 In: 2089.5 [P.O.:1520; I.V.:469.5; IV Piggyback:100] Out: 2670 [Urine:2560; Chest Tube:110] Intake/Output this shift: Total I/O In: 390 [P.O.:300; I.V.:40; IV Piggyback:50] Out: 440 [Urine:440]  General appearance: alert and cooperative Heart: regular rate and rhythm, S1, S2 normal, no murmur, click, rub or gallop Lungs: clear to auscultation bilaterally Extremities: extremities normal, atraumatic, no cyanosis or edema Wound: incisions ok  Lab Results:  Recent Labs  10/30/13 1735 10/30/13 1742 10/31/13 0427  WBC 10.0  --  8.7  HGB 11.5* 12.2* 10.6*  HCT 34.3* 36.0* 31.2*  PLT 106*  --  103*   BMET:  Recent Labs  10/30/13 0445  10/30/13 1742 10/31/13 0427  NA 143  --  137 138  K 4.1  --  4.0 3.7  CL 108  --  99 101  CO2 23  --   --  27  GLUCOSE 99  --  110* 93  BUN 6  --  5* 8  CREATININE 0.69  < > 0.70 0.69  CALCIUM 8.1*  --   --  8.0*  < > = values in this interval not displayed.  PT/INR:  Recent Labs  10/29/13 1330  LABPROT 16.8*  INR 1.36   ABG    Component Value Date/Time   PHART 7.326* 10/29/2013 1845   HCO3 24.0 10/29/2013  1845   TCO2 24 10/30/2013 1742   ACIDBASEDEF 2.0 10/29/2013 1845   O2SAT 95.0 10/29/2013 1845   CBG (last 3)   Recent Labs  10/30/13 2347 10/31/13 0417 10/31/13 0747  GLUCAP 112* 90 97    Assessment/Plan: S/P Procedure(s) (LRB): CORONARY ARTERY BYPASS GRAFTING x 4 (LIMA-LAD, SVG-OM, SVG-PD-PL) ENDOSCOPIC VEIN HARVEST RIGHT THIGH (N/A) INTRAOPERATIVE TRANSESOPHAGEAL ECHOCARDIOGRAM (N/A) He looks great Mobilize Diuresis Plan for transfer to step-down: see transfer orders   LOS: 6 days    BARTLE,BRYAN K 10/31/2013

## 2013-11-01 MED ORDER — FERROUS SULFATE 325 (65 FE) MG PO TABS
325.0000 mg | ORAL_TABLET | Freq: Every day | ORAL | Status: DC
  Administered 2013-11-02: 325 mg via ORAL
  Filled 2013-11-01 (×2): qty 1

## 2013-11-01 MED ORDER — LEVALBUTEROL HCL 0.63 MG/3ML IN NEBU
0.6300 mg | INHALATION_SOLUTION | Freq: Once | RESPIRATORY_TRACT | Status: AC
  Administered 2013-11-01: 0.63 mg via RESPIRATORY_TRACT
  Filled 2013-11-01: qty 3

## 2013-11-01 MED ORDER — LACTULOSE 10 GM/15ML PO SOLN
20.0000 g | Freq: Once | ORAL | Status: AC
  Administered 2013-11-01: 20 g via ORAL
  Filled 2013-11-01: qty 30

## 2013-11-01 MED ORDER — FUROSEMIDE 10 MG/ML IJ SOLN
40.0000 mg | Freq: Once | INTRAMUSCULAR | Status: AC
  Administered 2013-11-01: 40 mg via INTRAVENOUS
  Filled 2013-11-01: qty 4

## 2013-11-01 MED ORDER — FUROSEMIDE 40 MG PO TABS
40.0000 mg | ORAL_TABLET | Freq: Every day | ORAL | Status: DC
  Administered 2013-11-02: 40 mg via ORAL
  Filled 2013-11-01: qty 1

## 2013-11-01 MED ORDER — FUROSEMIDE 40 MG PO TABS: 40.0000 mg | ORAL_TABLET | Freq: Every day | ORAL | Status: DC

## 2013-11-01 MED ORDER — LEVALBUTEROL HCL 0.63 MG/3ML IN NEBU: 0.6300 mg | INHALATION_SOLUTION | Freq: Three times a day (TID) | RESPIRATORY_TRACT | Status: DC | PRN

## 2013-11-01 NOTE — Progress Notes (Signed)
Nursing note Patient ambulated in hallway without complaints. Will continue to monitor patient. Braelin Costlow, Bettina Gavia RN

## 2013-11-01 NOTE — Discharge Instructions (Signed)
Activity: 1.May walk up steps °               2.No lifting more than ten pounds for four weeks.  °               3.No driving for four weeks. °               4.Stop any activity that causes chest pain, shortness of breath, dizziness, sweating or excessive weakness. °               5.Avoid straining. °               6.Continue with your breathing exercises daily. ° °Diet: Low fat, Low salt diet ° °Wound Care: May shower.  Clean wounds with mild soap and water daily. Contact the office at [phone] if any problems arise. ° ° °Coronary Artery Bypass Grafting, Care After °Refer to this sheet in the next few weeks. These instructions provide you with information on caring for yourself after your procedure. Your health care provider may also give you more specific instructions. Your treatment has been planned according to current medical practices, but problems sometimes occur. Call your health care provider if you have any problems or questions after your procedure. °WHAT TO EXPECT AFTER THE PROCEDURE °Recovery from surgery will be different for everyone. Some people feel well after 3 or 4 weeks, while for others it takes longer. After your procedure, it is typical to have the following: °· Nausea and a lack of appetite.   °· Constipation. °· Weakness and fatigue.   °· Depression or irritability.   °· Pain or discomfort at your incision site. °HOME CARE INSTRUCTIONS °· Take medicines only as directed by your health care provider. Do not stop taking medicines or start any new medicines without first checking with your health care provider. °· Take your pulse as directed by your health care provider. °· Perform deep breathing as directed by your health care provider. If you were given a device called an incentive spirometer, use it to practice deep breathing several times a day. Support your chest with a pillow or your arms when you take deep breaths or cough. °· Keep incision areas clean, dry, and protected. Remove or  change any bandages (dressings) only as directed by your health care provider. You may have skin adhesive strips over the incision areas. Do not take the strips off. They will fall off on their own. °· Check incision areas daily for any swelling, redness, or drainage. °· If incisions were made in your legs, do the following: °¨ Avoid crossing your legs.   °¨ Avoid sitting for long periods of time. Change positions every 30 minutes.   °¨ Elevate your legs when you are sitting. °· Wear compression stockings as directed by your health care provider. These stockings help keep blood clots from forming in your legs. °· Take showers once your health care provider approves. Until then, only take sponge baths. Pat incisions dry. Do not rub incisions with a washcloth or towel. Do not take baths, swim, or use a hot tub until your health care provider approves. °· Eat foods that are high in fiber, such as raw fruits and vegetables, whole grains, beans, and nuts. Meats should be lean cut. Avoid canned, processed, and fried foods. °· Drink enough fluid to keep your urine clear or pale yellow. °· Weigh yourself every day. This helps identify if you are retaining fluid that may make your heart and lungs work harder. °·   Rest and limit activity as directed by your health care provider. You may be instructed to: °¨ Stop any activity at once if you have chest pain, shortness of breath, irregular heartbeats, or dizziness. Get help right away if you have any of these symptoms. °¨ Move around frequently for short periods or take short walks as directed by your health care provider. Increase your activities gradually. You may need physical therapy or cardiac rehabilitation to help strengthen your muscles and build your endurance. °¨ Avoid lifting, pushing, or pulling anything heavier than 10 lb (4.5 kg) for at least 6 weeks after surgery. °· Do not drive until your health care provider approves.  °· Ask your health care provider when you  may return to work. °· Ask your health care provider when you may resume sexual activity. °· Keep all follow-up visits as directed by your health care provider. This is important. °SEEK MEDICAL CARE IF: °· You have swelling, redness, increasing pain, or drainage at the site of an incision. °· You have a fever. °· You have swelling in your ankles or legs. °· You have pain in your legs.   °· You gain 2 or more pounds (0.9 kg) a day. °· You are nauseous or vomit. °· You have diarrhea.  °SEEK IMMEDIATE MEDICAL CARE IF: °· You have chest pain that goes to your jaw or arms. °· You have shortness of breath.   °· You have a fast or irregular heartbeat.   °· You notice a "clicking" in your breastbone (sternum) when you move.   °· You have numbness or weakness in your arms or legs. °· You feel dizzy or light-headed.   °MAKE SURE YOU: °· Understand these instructions. °· Will watch your condition. °· Will get help right away if you are not doing well or get worse. °Document Released: 09/14/2004 Document Revised: 07/12/2013 Document Reviewed: 08/04/2012 °[employer]® Patient Information ©2015 [employer]. This information is not intended to replace advice given to you by your health care provider. Make sure you discuss any questions you have with your health care provider. ° ° ° °

## 2013-11-01 NOTE — Discharge Summary (Signed)
Physician Discharge Summary       [address]       [place] [address]             [phone]    Patient ID: Gregory Hansen MRN: [id number] DOB/AGE: 07/10/1950 63 y.o.  Admit date: 10/25/2013 Discharge date: 11/02/2013  Admission Diagnoses: 1. History of coronary artery disease (s/p MI,PCI with stents) 2. History of hyperlipidemia 3. History of hypertension 4. History of tobacco abuse 5. History of GERD 6. History of depression  Discharge Diagnoses:  1. History of coronary artery disease (s/p MI,PCI with stents) 2. History of hyperlipidemia 3. History of hypertension 4. History of tobacco abuse 5. History of GERD 6. History of depression 7. ABL anemia 8. Thrombocytopenia  Procedure (s):  Cardiac catheterization done by Dr. Burt Knack on 10/25/2013: Coronary angiography:  Coronary dominance: right  Left mainstem: The left mainstem is heavily calcified. The vessel has 90-95% proximal stenosis, new from the previous study. There is irregularity to the plaque with evidence of ulceration.  Left anterior descending (LAD): The LAD is diffusely diseased. The proximal vessel at the first diagonal branch has 75% stenosis. The mid vessel at the origin of the second diagonal branch as 50-60% stenosis with diffuse disease involving the entire distal vessel.  Left circumflex (LCx): The left circumflex is patent. The mid circumflex was stented with no significant in-stent restenosis. The OM branches are patent.  Right coronary artery (RCA): The right coronary cusp is severely calcified. The RCA is heavily stented and calcified throughout its course. The stented segment involving the ostium and proximal portion of the RCA is severely restenosed with diffuse 80-90% in-stent restenosis. The remaining portions of the RCA are patent with moderate mid vessel stenosis of 40-50%. The distal vessel has diffuse irregularity. The PDA and PLA branches are patent with mild diffuse  irregularity.  Left ventriculography: Left ventricular systolic function is normal, LVEF is estimated at 55-65%, there is no significant mitral regurgitation   1. Median Sternotomy 2. Extracorporeal circulation 3. Coronary artery bypass grafting x 4  Left internal mammary graft to the LAD  SVG to OM  Sequential SVG to PDA and PL 4. Endoscopic vein harvest from the right leg by Dr. Cyndia Bent on 10/29/2013.  History of Presenting Illness: The patient is a 63 year old gentleman with a history of hyperlipidemia, hypertension and a strong family history of heart disease who has undergone multiple stenting procedures for coronary disease in the past. He suffered an inferior MI in 2002 and underwent initial stenting of the RCA at that time. He has had multiple PCI's since then with the last one in January 2015 for severe stenosis in the ostium of the RCA treated with a DES. He now reports progressive exertional chest discomfort over the past few months occuring with walking any distance, riding his bike or mowing the lawn. He has had no symptoms at rest. Cardiac cath today shows 90-95% proximal LM stenosis. The LAD has proximal 75% stenosis and 50-60% mid vessel stenosis. The LCX is widely patent. The RCA has 80-90% proximal stenosis within the ostial and proximal stented segment. LVEF is 55-65%. He has been on Plavix chronically with a P2Y12 level this am of 114 showing significant platelet inhibition. Dr. Cyndia Bent was consulted for the consideration of coronary artery bypass grafting surgery. Potential risks, complications, and benefits were discussed and the patient agreed to proceed with surgery. Pre operative carotid duplex showed no significant internal carotid artery disease bilaterally. After  a Plavis washout, he underwent a CABGx4 on 10/29/2013.  Brief Hospital Course:  The patient was extubated the evening of surgery without difficulty. He remained afebrile and hemodynamically stable. Gordy Councilman, a line,  and chest tubes were removed early in the post operative course. Foley did remain for a few days as patient remained in ICU and was being diuresed. Lopressor was started and titrated accordingly. He was volume over loaded and diuresed. He was weaned off the insulin drip. The patient's HGA1C pre op was 5.4. The patient was felt surgically stable for transfer from the ICU to [facility]U for further convalescence on 10/31/2012. He continues to progress with cardiac rehab. He/she was ambulating on room air. He has been tolerating a diet and has had a bowel movement. Epicardial pacing wires will be removed prior to discharge. Chest tube sutures will be removed in the office after discharge. He is felt surgically stable for discharge.   Latest Vital Signs: Blood pressure 143/87, pulse 86, temperature 98.6 F (37 C), temperature source Oral, resp. rate 18, height 6\' 2"  (1.88 m), weight 243 lb (110.224 kg), SpO2 96.00%.  Physical Exam: Cardiovascular: RRR, no murmurs, gallops, or rubs.  Pulmonary: Clear to auscultation bilaterally; no rales, wheezes, or rhonchi.  Abdomen: Soft, non tender, some distention,bowel sounds present.  Extremities: Mild bilateral lower extremity edema.  Wounds: Clean and dry. No erythema or signs of infection.   Discharge Condition:Stable  Recent laboratory studies:  Lab Results  Component Value Date   WBC 8.7 10/31/2013   HGB 10.6* 10/31/2013   HCT 31.2* 10/31/2013   MCV 97.8 10/31/2013   PLT 103* 10/31/2013   Lab Results  Component Value Date   NA 138 10/31/2013   K 3.7 10/31/2013   CL 101 10/31/2013   CO2 27 10/31/2013   CREATININE 0.69 10/31/2013   GLUCOSE 93 10/31/2013      Diagnostic Studies: Dg Chest Port 1 View  10/31/2013   CLINICAL DATA:  Postop cardiac surgery  EXAM: PORTABLE CHEST - 1 VIEW  COMPARISON:  portable chest x-ray of October 30, 2013  FINDINGS: The lungs remain hypoinflated. Retrocardiac density on the left persists. The cardiopericardial silhouette is  enlarged. The pulmonary vascularity is not engorged. There is no significant pleural effusion. The Cordis sheath on the right remains in place. The Swan-Ganz catheter is been withdrawn and chest tubes on the left have been removed.  IMPRESSION: There is an improved appearance of the chest since yesterday's study. Left lower lobe atelectasis persists.   Electronically Signed   By: David  Martinique   On: 10/31/2013 07:28   Discharge Medications:   Medication List    STOP taking these medications       clopidogrel 75 MG tablet  Commonly known as:  PLAVIX     guaifenesin 400 MG Tabs tablet  Commonly known as:  HUMIBID E     ibuprofen 200 MG tablet  Commonly known as:  ADVIL,MOTRIN     isosorbide mononitrate 30 MG 24 hr tablet  Commonly known as:  IMDUR     NITROSTAT 0.4 MG SL tablet  Generic drug:  nitroGLYCERIN      TAKE these medications       aspirin 325 MG EC tablet  Take 1 tablet (325 mg total) by mouth daily.     atorvastatin 80 MG tablet  Commonly known as:  LIPITOR  Take 1 tablet (80 mg total) by mouth daily.     buPROPion 300 MG 24 hr tablet  Commonly known as:  WELLBUTRIN XL  Take 300 mg by mouth daily.     cetirizine 10 MG tablet  Commonly known as:  ZYRTEC  Take 10 mg by mouth daily.     desonide 0.05 % lotion  Commonly known as:  DESOWEN  Apply 1 application topically daily. To face     ferrous sulfate 325 (65 FE) MG tablet  Take 1 tablet (325 mg total) by mouth daily with breakfast.     furosemide 40 MG tablet  Commonly known as:  LASIX  Take 1 tablet (40 mg total) by mouth daily. For 5 days then stop.     loratadine 10 MG tablet  Commonly known as:  CLARITIN  Take 10 mg by mouth daily.     lubriderm seriously sensitive Lotn  Apply 1 application topically daily. To upper body and face     metoprolol succinate 50 MG 24 hr tablet  Commonly known as:  TOPROL-XL  Take 50 mg by mouth daily. Take with or immediately following a meal.     multivitamin  per tablet  Take 1 tablet by mouth daily.     nefazodone 200 MG tablet  Commonly known as:  SERZONE  Take 300 mg by mouth at bedtime.     oxyCODONE 5 MG immediate release tablet  Commonly known as:  Oxy IR/ROXICODONE  Take 1-2 tablets (5-10 mg total) by mouth every 4 (four) hours as needed for severe pain.     pantoprazole 20 MG tablet  Commonly known as:  PROTONIX  Take 1 tablet (20 mg total) by mouth 2 (two) times daily before a meal.     potassium chloride SA 20 MEQ tablet  Commonly known as:  K-DUR,KLOR-CON  Take 1 tablet (20 mEq total) by mouth daily. For 5 days then stop.     ramipril 5 MG capsule  Commonly known as:  ALTACE  Take 1 capsule (5 mg total) by mouth daily.        The patient has been discharged on:   1.Beta Blocker:  Yes [ x  ]                              No   [   ]                              If No, reason:  2.Ace Inhibitor/ARB: Yes [  x ]                                     No  [    ]                                     If No, reason:  3.Statin:   Yes [ x  ]                  No  [   ]                  If No, reason:  4.Ecasa:  Yes  [x   ]                  No   [   ]  If No, reason:  Follow Up Appointments: Follow-up Information   Follow up with Richardson Dopp, PA-C On 11/19/2013. (Appointment time is at 11:10 am)    Specialty:  Physician Assistant   Contact information:   [address]. [place] [address] [phone]       Follow up with Gaye Pollack, MD On 12/01/2013. (PA/LAT CXR to be taken (at [facility] which is in the same building as Dr. Vivi Martens office) on 12/01/2013 at 9:30 am;Appointment with Dr. Cyndia Bent is at 10:30 am)    Specialty:  Cardiothoracic Surgery   Contact information:   [address] [place] [address] [phone]       Follow up with Gaye Pollack, MD On 11/08/2013. (Appointment is with NURSE only for chest tube suture removal only. Appointment time is  at 9:15  am)    Specialty:  Cardiothoracic Surgery   Contact information:   [place] [address] [phone]       Signed: Lars Pinks MPA-C 11/02/2013, 8:52 AM

## 2013-11-01 NOTE — Progress Notes (Signed)
CARDIAC REHAB PHASE I   PRE:  Rate/Rhythm: 95 SR    BP: sitting 123/87    SaO2: 98 RA  MODE:  Ambulation: 550 ft   POST:  Rate/Rhythm: 115 ST    BP: sitting 116/69     SaO2: 97 RA  Tolerated well. Moving well without c/o. Some SOB noted. HR elevated. To recliner. Encouraged to walk x2 more. [place], [place], ACSM 11/01/2013 10:31 AM

## 2013-11-01 NOTE — Progress Notes (Addendum)
Patient ambulated in hallway independently,600 ft patient states he feels better this walk. Will monitor patient. Kerin Kren, Bettina Gavia RN

## 2013-11-01 NOTE — Progress Notes (Addendum)
      [address]       [place] [address]             [phone]        3 Days Post-Op Procedure(s) (LRB): CORONARY ARTERY BYPASS GRAFTING x 4 (LIMA-LAD, SVG-OM, SVG-PD-PL) ENDOSCOPIC VEIN HARVEST RIGHT THIGH (N/A) INTRAOPERATIVE TRANSESOPHAGEAL ECHOCARDIOGRAM (N/A)  Subjective: Patient passing flatus but no bowel movement yet.  Objective: Vital signs in last 24 hours: Temp:  [98.1 F (36.7 C)-98.9 F (37.2 C)] 98.9 F (37.2 C) (08/24 0457) Pulse Rate:  [89-98] 89 (08/24 0457) Cardiac Rhythm:  [-] Normal sinus rhythm;Sinus tachycardia (08/23 2100) Resp:  [13-23] 18 (08/24 0457) BP: (103-136)/(53-72) 120/53 mmHg (08/24 0457) SpO2:  [91 %-99 %] 91 % (08/24 0457) Weight:  [247 lb 3.2 oz (112.129 kg)] 247 lb 3.2 oz (112.129 kg) (08/24 0457)  Pre op weight 108 kg Current Weight  11/01/13 247 lb 3.2 oz (112.129 kg)      Intake/Output from previous day: 08/23 0701 - 08/24 0700 In: 450 [P.O.:300; I.V.:100; IV Piggyback:50] Out: 1740 [Urine:1040]   Physical Exam:  Cardiovascular: RRR, no murmurs, gallops, or rubs. Pulmonary: Clear to auscultation bilaterally; no rales, wheezes, or rhonchi. Abdomen: Soft, non tender, some distention,bowel sounds present. Extremities: Mild bilateral lower extremity edema. Wounds: Clean and dry.  No erythema or signs of infection.  Lab Results: CBC: Recent Labs  10/30/13 1735 10/30/13 1742 10/31/13 0427  WBC 10.0  --  8.7  HGB 11.5* 12.2* 10.6*  HCT 34.3* 36.0* 31.2*  PLT 106*  --  103*   BMET:  Recent Labs  10/30/13 0445  10/30/13 1742 10/31/13 0427  NA 143  --  137 138  K 4.1  --  4.0 3.7  CL 108  --  99 101  CO2 23  --   --  27  GLUCOSE 99  --  110* 93  BUN 6  --  5* 8  CREATININE 0.69  < > 0.70 0.69  CALCIUM 8.1*  --   --  8.0*  < > = values in this interval not displayed.  PT/INR:  Lab Results  Component Value Date   INR 1.36 10/29/2013   INR 1.08 10/28/2013   INR 1.11 10/25/2013   ABG:    INR: Will add last result for INR, ABG once components are confirmed Will add last 4 CBG results once components are confirmed  Assessment/Plan:  1. CV - SR in the 90's. On Lopressor 25 bid. Restart low dose Ramipril when BP allows. 2.  Pulmonary - Encourage incentive spirometer 3. Volume Overload - On Lasix 40 daily. Will give IV this am 4.  Acute blood loss anemia - H and H yesterday 10.6 and 31.2. Start Ferrous sulfate 6. Remove EPW in am 7.LOC constipation 8. Likely discharge in am   ZIMMERMAN,DONIELLE MPA-C 11/01/2013,8:11 AM   Chart reviewed, patient examined, agree with above. Feels well. Had BM today Plan home tomorrow if no changes. DC PW in am.

## 2013-11-01 NOTE — Progress Notes (Signed)
Patient ambulated approximately 400 ft with RN and tolerated well.  Steady on his feet, HR in 100s, no SOB.  Left in room following walk with call bell within reach.  Will continue to monitor.  Randell Patient

## 2013-11-02 ENCOUNTER — Encounter (HOSPITAL_COMMUNITY): Payer: Self-pay | Admitting: Surgery

## 2013-11-02 ENCOUNTER — Telehealth: Payer: Self-pay | Admitting: Cardiovascular Disease

## 2013-11-02 MED ORDER — FUROSEMIDE 40 MG PO TABS: 40.0000 mg | ORAL_TABLET | Freq: Every day | ORAL | Status: DC

## 2013-11-02 MED ORDER — PANTOPRAZOLE SODIUM 40 MG PO TBEC: 40.0000 mg | DELAYED_RELEASE_TABLET | Freq: Every day | ORAL | Status: DC

## 2013-11-02 MED ORDER — OXYCODONE HCL 5 MG PO TABS: 5.0000 mg | ORAL_TABLET | ORAL | Status: DC | PRN

## 2013-11-02 MED ORDER — FERROUS SULFATE 325 (65 FE) MG PO TABS: 325.0000 mg | ORAL_TABLET | Freq: Every day | ORAL | Status: DC

## 2013-11-02 MED ORDER — RAMIPRIL 5 MG PO CAPS
5.0000 mg | ORAL_CAPSULE | Freq: Every day | ORAL | Status: DC
Start: 2013-11-02 — End: 2013-11-02
  Filled 2013-11-02: qty 1

## 2013-11-02 MED ORDER — ATORVASTATIN CALCIUM 80 MG PO TABS: 80.0000 mg | ORAL_TABLET | Freq: Every day | ORAL | Status: DC

## 2013-11-02 MED ORDER — POTASSIUM CHLORIDE CRYS ER 20 MEQ PO TBCR: 20.0000 meq | EXTENDED_RELEASE_TABLET | Freq: Every day | ORAL | Status: DC

## 2013-11-02 MED ORDER — ASPIRIN 325 MG PO TBEC: 325.0000 mg | DELAYED_RELEASE_TABLET | Freq: Every day | ORAL | Status: DC

## 2013-11-02 MED ORDER — RAMIPRIL 5 MG PO CAPS: 5.0000 mg | ORAL_CAPSULE | Freq: Every day | ORAL | Status: DC

## 2013-11-02 MED FILL — Phenylephrine HCl Inj 10 MG/ML: INTRAMUSCULAR | Qty: 2 | Status: AC

## 2013-11-02 MED FILL — Potassium Chloride Inj 2 mEq/ML: INTRAVENOUS | Qty: 40 | Status: AC

## 2013-11-02 MED FILL — Magnesium Sulfate Inj 50%: INTRAMUSCULAR | Qty: 10 | Status: AC

## 2013-11-02 MED FILL — Dextrose Inj 5%: INTRAVENOUS | Qty: 250 | Status: AC

## 2013-11-02 MED FILL — Heparin Sodium (Porcine) Inj 1000 Unit/ML: INTRAMUSCULAR | Qty: 30 | Status: AC

## 2013-11-02 NOTE — Progress Notes (Addendum)
      [address]       [place] [address]             [phone]        4 Days Post-Op Procedure(s) (LRB): CORONARY ARTERY BYPASS GRAFTING x 4 (LIMA-LAD, SVG-OM, SVG-PD-PL) ENDOSCOPIC VEIN HARVEST RIGHT THIGH (N/A) INTRAOPERATIVE TRANSESOPHAGEAL ECHOCARDIOGRAM (N/A)  Subjective: Patient with large bowel movement. Feels good.  Objective: Vital signs in last 24 hours: Temp:  [97.7 F (36.5 C)-98.6 F (37 C)] 98.6 F (37 C) (08/25 0500) Pulse Rate:  [86-107] 86 (08/25 0500) Cardiac Rhythm:  [-] Normal sinus rhythm;Sinus tachycardia (08/24 2204) Resp:  [18] 18 (08/25 0500) BP: (116-143)/(64-89) 143/87 mmHg (08/25 0500) SpO2:  [96 %-98 %] 96 % (08/25 0500) Weight:  [243 lb (110.224 kg)] 243 lb (110.224 kg) (08/25 0500)  Pre op weight 108 kg Current Weight  11/02/13 243 lb (110.224 kg)      Intake/Output from previous day: 08/24 0701 - 08/25 0700 In: 600 [P.O.:600] Out: 1850 [Urine:1850]   Physical Exam:  Cardiovascular: RRR, no murmurs, gallops, or rubs. Pulmonary: Clear to auscultation bilaterally; no rales, wheezes, or rhonchi. Abdomen: Soft, non tender, some distention,bowel sounds present. Extremities: Mild bilateral lower extremity edema. Wounds: Clean and dry.  No erythema or signs of infection.  Lab Results: CBC:  Recent Labs  10/30/13 1735 10/30/13 1742 10/31/13 0427  WBC 10.0  --  8.7  HGB 11.5* 12.2* 10.6*  HCT 34.3* 36.0* 31.2*  PLT 106*  --  103*   BMET:   Recent Labs  10/30/13 1742 10/31/13 0427  NA 137 138  K 4.0 3.7  CL 99 101  CO2  --  27  GLUCOSE 110* 93  BUN 5* 8  CREATININE 0.70 0.69  CALCIUM  --  8.0*    PT/INR:  Lab Results  Component Value Date   INR 1.36 10/29/2013   INR 1.08 10/28/2013   INR 1.11 10/25/2013   ABG:  INR: Will add last result for INR, ABG once components are confirmed Will add last 4 CBG results once components are confirmed  Assessment/Plan:  1. CV - SR in the 80's. On  Lopressor 25 bid. Will restart  low dose Ramipril for better BP control. 2.  Pulmonary - Encourage incentive spirometer 3. Volume Overload - Given Lasix IV yesterday with good diuresis. Keep on Lasix 40 daily for several days.  4.  Acute blood loss anemia - H and H yesterday 10.6 and 31.2. Start Ferrous sulfate 6. Remove EPW  7. Chest tube sutures to remain 8. Discharge in am   ZIMMERMAN,DONIELLE MPA-C 11/02/2013,8:11 AM   Chart reviewed, patient examined, agree with above.

## 2013-11-02 NOTE — Telephone Encounter (Signed)
New message      Need prior auth on pantoprazole 20mg ----1 tab bid.  Insurance only covers 1 tab per day

## 2013-11-02 NOTE — Progress Notes (Signed)
[id number] Cardiac Rehab Completed discharge education with pt.He voices understanding. Pt agrees to [facility]. CRP in [place], will send referral. States that he has watched surgery discharge video. Deon Pilling, RN 11/02/2013 9:46 AM

## 2013-11-02 NOTE — Telephone Encounter (Signed)
I spoke with [facility] and the pharmacist Cherlynn Kaiser) cancelled order for Pantoprazole 20mg  BID. Cherlynn Kaiser did see the order for Pantoprazole 40mg  daily and this order his authorized.

## 2013-11-02 NOTE — Telephone Encounter (Signed)
I spoke with the pt and made him aware that his insurance will not approve Protonix twice a day due to a quantity limitation. I will switch the pt to 40mg  daily. New Rx sent to the pharmacy per the pt's request.

## 2013-11-02 NOTE — Progress Notes (Signed)
EPW removed per protocol, ends intact, patient tolerated well. Routine vitals began per protocol 155 76, hr 83

## 2013-11-02 NOTE — Telephone Encounter (Signed)
New messages    Patient calling regarding to refill protonix 20 mg twice a day  . Need MD approval.

## 2013-11-03 NOTE — Care Management Note (Signed)
    Page 1 of 1   11/03/2013     4:06:30 PM CARE MANAGEMENT NOTE 11/03/2013  Patient:  Gregory Hansen, Gregory Hansen   Account Number:  [id number]  Date Initiated:  10/25/2013  Documentation initiated by:  Elissa Hefty  Subjective/Objective Assessment:   adm w pos card cath, for cvts eval     Action/Plan:   from home   Anticipated DC Date:  11/02/2013   Anticipated DC Plan:  [place]  CM consult      Choice offered to / List presented to:             Status of service:  Completed, signed off Medicare Important Message given?   (If response is "NO", the following Medicare IM given date fields will be blank) Date Medicare IM given:   Medicare IM given by:   Date Additional Medicare IM given:   Additional Medicare IM given by:    Discharge Disposition:  HOME/SELF CARE  Per UR Regulation:  Reviewed for med. necessity/level of care/duration of stay  If discussed at [place] of Stay Meetings, dates discussed:   11/02/2013    Comments:  11/02/13 Ellan Lambert, RN, BSN [phone] Pt s/p CABG on 8/21.  Pt for dc home today with family.  No dc needs identified.

## 2013-11-08 ENCOUNTER — Ambulatory Visit (INDEPENDENT_AMBULATORY_CARE_PROVIDER_SITE_OTHER): Payer: Self-pay | Admitting: *Deleted

## 2013-11-08 DIAGNOSIS — Z951 Presence of aortocoronary bypass graft: Secondary | ICD-10-CM

## 2013-11-08 DIAGNOSIS — I2510 Atherosclerotic heart disease of native coronary artery without angina pectoris: Secondary | ICD-10-CM

## 2013-11-08 DIAGNOSIS — Z4802 Encounter for removal of sutures: Secondary | ICD-10-CM

## 2013-11-08 NOTE — Progress Notes (Signed)
Mr. Twilley returns s/p CABG X 4 on 10/29/13.  His sternal incision is very well healed.  I removed the three sutures from the previous  pleural/mediastinal tube sites without difficulty of discomfort.  These are well healed also.  His right leg EVH site is still a little pink but intact.  He is eating, bowels are o.k. and using minimal pain meds.  There is no LE edema.  He will return as scheduled with a cxr.

## 2013-11-19 ENCOUNTER — Ambulatory Visit (INDEPENDENT_AMBULATORY_CARE_PROVIDER_SITE_OTHER): Payer: BC Managed Care – PPO | Admitting: Physician Assistant

## 2013-11-19 ENCOUNTER — Encounter: Payer: Self-pay | Admitting: Physician Assistant

## 2013-11-19 VITALS — BP 100/70 | HR 80 | Ht 74.0 in | Wt 236.0 lb

## 2013-11-19 DIAGNOSIS — I10 Essential (primary) hypertension: Secondary | ICD-10-CM

## 2013-11-19 DIAGNOSIS — E785 Hyperlipidemia, unspecified: Secondary | ICD-10-CM

## 2013-11-19 DIAGNOSIS — I2510 Atherosclerotic heart disease of native coronary artery without angina pectoris: Secondary | ICD-10-CM

## 2013-11-19 DIAGNOSIS — Z951 Presence of aortocoronary bypass graft: Secondary | ICD-10-CM

## 2013-11-19 MED ORDER — ATORVASTATIN CALCIUM 80 MG PO TABS: 80.0000 mg | ORAL_TABLET | Freq: Every day | ORAL | Status: DC

## 2013-11-19 MED ORDER — ISOSORBIDE MONONITRATE ER 30 MG PO TB24
15.0000 mg | ORAL_TABLET | Freq: Every day | ORAL | Status: DC
Start: 2013-11-19 — End: 2014-01-24

## 2013-11-19 NOTE — Progress Notes (Signed)
Cardiology Office Note    Date:  11/19/2013   ID:  Gregory Hansen, DOB [birth year], MRN [id number]  PCP:  No PCP Per Patient  Cardiologist:  Dr. [name]     History of Present Illness: Gregory Hansen is a 63 y.o. male with a hx of CAD s/p IMI in 2002 and multiple PCI procedures since.  He underwent DES to ostial RCA in 03/2013.  Other hx includes HL, HTN, GERD, ankylosing spondylitis.  He recently presented to see Dr. [name] with CCS class 3 angina.  Nuclear study was high risk and he was set up for LHC which demonstrated high grade LM and LAD disease and RCA in stent restenosis. He was referred for CABG.  He was admitted 8/17-8/25 and underwent CABG x 4 with Dr. Cyndia Bent (L-LAD, S-OM, S-PDA/PL).  Post op course was fairly uneventful.  He returns for FU.  He is doing very well. He has not had any problems since DC.  He has been walking on his own and does not plan to pursue cardiac rehab.  His chest is not that sore.  He denies significant dyspnea, orthopnea, PND, edema.  He denies syncope or near syncope.      Studies:  - Nuclear (8/15):  Ant, apical septal/apical lateral ischemia with ECG changes, EF 52%; HIGH RISK  - LHC (8/15):  prox LM 90-95%, prox LAD 75%, mid LAD 50-60%, mid CFX stents patent, ostial and prox RCA stents with 80-90% ISR, mid RCA 40-50%, EF 55-65% >> CABG  - Carotid US (8/15):  Bilateral ICA 1-39%   Recent Labs/Images: 10/28/2013: ALT 50  10/31/2013: Creatinine 0.69; Hemoglobin 10.6*; Potassium 3.7   Wt Readings from Last 3 Encounters:  11/19/13 236 lb (107.049 kg)  11/02/13 243 lb (110.224 kg)  11/02/13 243 lb (110.224 kg)     Past Medical History  Diagnosis Date  . Hyperlipidemia     mixed  . Spondylitis, ankylosing   . GERD (gastroesophageal reflux disease)   . Depression   . Hypertension   . Coronary artery disease     s/p multiple percutaneous interventions, PCI 202 for DMI and DES distal RCA and CFX-OM 2006  . Myocardial infarction   .  Anxiety   . Arthritis     SPINE   . Anginal pain     Current Outpatient Prescriptions  Medication Sig Dispense Refill  . atorvastatin (LIPITOR) 80 MG tablet Take 1 tablet (80 mg total) by mouth daily.  30 tablet  1  . buPROPion (WELLBUTRIN XL) 300 MG 24 hr tablet Take 300 mg by mouth daily.      Marland Kitchen desonide (DESOWEN) 0.05 % lotion Apply 1 application topically daily. To face      . Emollient (LUBRIDERM SERIOUSLY SENSITIVE) LOTN Apply 1 application topically daily. To upper body and face      . ferrous sulfate 325 (65 FE) MG tablet Take 1 tablet (325 mg total) by mouth daily with breakfast.    0  . isosorbide mononitrate (IMDUR) 30 MG 24 hr tablet Take 30 mg by mouth daily.       Marland Kitchen loratadine (CLARITIN) 10 MG tablet Take 10 mg by mouth daily.      . metoprolol succinate (TOPROL-XL) 50 MG 24 hr tablet Take 50 mg by mouth daily. Take with or immediately following a meal.      . multivitamin (THERAGRAN) per tablet Take 1 tablet by mouth daily.        . nefazodone (  SERZONE) 200 MG tablet Take 300 mg by mouth at bedtime.      Marland Kitchen NITROSTAT 0.4 MG SL tablet Place 0.4 mg under the tongue every 5 (five) minutes as needed.       . pantoprazole (PROTONIX) 40 MG tablet Take 1 tablet (40 mg total) by mouth daily.  90 tablet  3  . ramipril (ALTACE) 5 MG capsule Take 1 capsule (5 mg total) by mouth daily.  30 capsule  1   No current facility-administered medications for this visit.     Allergies:   Review of patient's allergies indicates no known allergies.   Social History:  The patient  reports that he quit smoking about 31 years ago. He has never used smokeless tobacco. He reports that he drinks about 8.4 ounces of alcohol per week. He reports that he does not use illicit drugs.   Family History:  The patient's family history includes Breast cancer in his mother; Heart attack in his brother; Heart disease in his brother, father, maternal grandfather, and another family member; Leukemia in his maternal  grandmother.   ROS:  Please see the history of present illness.      All other systems reviewed and negative.   PHYSICAL EXAM: VS:  BP 100/70  Pulse 80  Ht 6\' 2"  (1.88 m)  Wt 236 lb (107.049 kg)  BMI 30.29 kg/m2 Well nourished, well developed, in no acute distress HEENT: normal Neck: no JVD Cardiac:  normal S1, S2; RRR; no murmur Chest:  Median sternotomy wound well healed without erythema or d/c Lungs:  clear to auscultation bilaterally, no wheezing, rhonchi or rales Abd: soft, nontender, no hepatomegaly Ext: no edema Skin: warm and dry Neuro:  CNs 2-12 intact, no focal abnormalities noted  EKG:  NSR ,HR 80, rightward axis, inc RBBB     ASSESSMENT AND PLAN:  1. Coronary artery disease S/P CABG x 4:  Doing well.  He plans to walk on his own and defer cardiac rehab.  He is still on the Imdur.  BP is running low.  I recommend he decrease this to 15 mg QD.  Continue ASA, statin, beta blocker, ACEI.  FU with Dr. Cyndia Bent as planned.  2. HYPERTENSION, BENIGN:  Adjust nitrates as noted.  3. HYPERLIPIDEMIA-MIXED:  Continue high intensity statin therapy.  Check Lipids and LFTs in 6 weeks.    Disposition:  FU with Dr. [name] in 2 mos   Signed, Versie Starks, MHS 11/19/2013 11:40 AM    [facility] [address], [place], [place]  [address] Phone: [phone]; Fax: [phone]

## 2013-11-19 NOTE — Patient Instructions (Signed)
Your physician has recommended you make the following change in your medication:  1) REDUCE Imdur to 1/2 tablet (15mg ) daily 2) You can stop the Iron supplement An Rx for Atorvastatin has been sent to your pharmacy  Your physician recommends that you return for a FASTING lipid profile and alt: in 6 weeks  Your physician recommends that you schedule a follow-up appointment in: 2 months with Dr.Cooper

## 2013-11-30 ENCOUNTER — Other Ambulatory Visit: Payer: Self-pay | Admitting: Surgery

## 2013-11-30 DIAGNOSIS — I10 Essential (primary) hypertension: Secondary | ICD-10-CM

## 2013-12-01 ENCOUNTER — Ambulatory Visit
Admission: RE | Admit: 2013-12-01 | Discharge: 2013-12-01 | Disposition: A | Discharge status: Home / Self Care | Payer: BC Managed Care – PPO | Source: Ambulatory Visit | Attending: Surgery | Admitting: Surgery

## 2013-12-01 ENCOUNTER — Ambulatory Visit (INDEPENDENT_AMBULATORY_CARE_PROVIDER_SITE_OTHER): Payer: Self-pay | Admitting: Surgery

## 2013-12-01 ENCOUNTER — Encounter: Payer: Self-pay | Admitting: Surgery

## 2013-12-01 VITALS — BP 125/77 | HR 87 | Ht 74.0 in | Wt 236.0 lb

## 2013-12-01 DIAGNOSIS — I10 Essential (primary) hypertension: Secondary | ICD-10-CM

## 2013-12-01 DIAGNOSIS — Z951 Presence of aortocoronary bypass graft: Secondary | ICD-10-CM

## 2013-12-01 NOTE — Progress Notes (Signed)
HPI:  Patient returns for routine postoperative follow-up having undergone CABG x 4 on 10/29/2013. The patient's early postoperative recovery while in the hospital was notable for an uncomplicated postop course. Since hospital discharge the patient reports that he has been feeling well. He has been walking without chest pain or dyspnea.   Current Outpatient Prescriptions  Medication Sig Dispense Refill  . aspirin 81 MG tablet Take 81 mg by mouth daily.      Marland Kitchen atorvastatin (LIPITOR) 80 MG tablet Take 1 tablet (80 mg total) by mouth daily.  90 tablet  1  . buPROPion (WELLBUTRIN XL) 300 MG 24 hr tablet Take 300 mg by mouth daily.      Marland Kitchen desonide (DESOWEN) 0.05 % lotion Apply 1 application topically daily. To face      . Emollient (LUBRIDERM SERIOUSLY SENSITIVE) LOTN Apply 1 application topically daily. To upper body and face      . isosorbide mononitrate (IMDUR) 30 MG 24 hr tablet Take 0.5 tablets (15 mg total) by mouth daily.      Marland Kitchen loratadine (CLARITIN) 10 MG tablet Take 10 mg by mouth daily.      . metoprolol succinate (TOPROL-XL) 50 MG 24 hr tablet Take 50 mg by mouth daily. Take with or immediately following a meal.      . multivitamin (THERAGRAN) per tablet Take 1 tablet by mouth daily.        . nefazodone (SERZONE) 200 MG tablet Take 300 mg by mouth at bedtime.      . pantoprazole (PROTONIX) 40 MG tablet Take 1 tablet (40 mg total) by mouth daily.  90 tablet  3  . Probiotic Product (PROBIOTIC DAILY PO) Take by mouth daily. PROBIOTIC CAP      . ramipril (ALTACE) 5 MG capsule Take 1 capsule (5 mg total) by mouth daily.  30 capsule  1  . NITROSTAT 0.4 MG SL tablet Place 0.4 mg under the tongue every 5 (five) minutes as needed.        No current facility-administered medications for this visit.    Physical Exam: BP 125/77  Pulse 87  Ht 6\' 2"  (1.88 m)  Wt 236 lb (107.049 kg)  BMI 30.29 kg/m2  SpO2 94%  He looks well. Lung exam is clear. Cardiac exam shows a regular rate  and rhythm with normal heart sounds. Chest incision is healing well and sternum is stable. The leg incisions are healing well and there is no peripheral edema.    Diagnostic Tests:  CLINICAL DATA: Status post CABG on October 29, 2013 currently  asymptomatic  EXAM:  CHEST 2 VIEW  COMPARISON: Portable chest x-ray of October 31, 2013  FINDINGS:  The lungs are well-expanded. There is a persistent small amount of  pleural thickening or scarring along the left lower lateral thoracic  wall. The cardiac silhouette is normal in size. The pulmonary  vascularity is not engorged. The mediastinum is normal in width.  There are 7 intact sternal wires. The bony thorax is unremarkable.  IMPRESSION:  There is minimal residual scarring at the left lung base. There is  no evidence of CHF nor other acute cardiopulmonary abnormality.  Electronically Signed  By: David Martinique  On: 12/01/2013 09:37    Impression:  Overall I think he is doing well. I encouraged him to continue walking. He is not planning to participate in cardiac rehab. I told him he could drive his car but should not lift anything heavier than 10  lbs for three months postop. He was sent home on the Imdur that he was on preop for angina. It was decreased to 15 mg daily recently by cardiology due to low BP. I think he can stop this completely postop.   Plan:  He will continue to follow up with cardiology and will contact me if he develops any problems with his incisions.

## 2013-12-07 ENCOUNTER — Telehealth: Payer: Self-pay

## 2013-12-07 NOTE — Telephone Encounter (Signed)
Crystal called the office because the pt is in her dental chair due to tooth pain and requires a root canal.  She needs to know if the pt is okay to have a root canal and does the pt need antibiotics due to recent CABG.  Per Dr Burt Knack the pt is okay from a cardiac standpoint and does not require antibiotics. I spoke with Crystal and made her aware of this information.

## 2013-12-31 ENCOUNTER — Other Ambulatory Visit (INDEPENDENT_AMBULATORY_CARE_PROVIDER_SITE_OTHER): Payer: BC Managed Care – PPO | Admitting: *Deleted

## 2013-12-31 DIAGNOSIS — E785 Hyperlipidemia, unspecified: Secondary | ICD-10-CM

## 2013-12-31 LAB — HEPATIC FUNCTION PANEL
ALT: 42 U/L (ref 0–53)
AST: 47 U/L — ABNORMAL HIGH (ref 0–37)
Albumin: 3.4 g/dL — ABNORMAL LOW (ref 3.5–5.2)
Alkaline Phosphatase: 74 U/L (ref 39–117)
BILIRUBIN DIRECT: 0 mg/dL (ref 0.0–0.3)
TOTAL PROTEIN: 7.7 g/dL (ref 6.0–8.3)
Total Bilirubin: 0.5 mg/dL (ref 0.2–1.2)

## 2013-12-31 LAB — LIPID PANEL
CHOL/HDL RATIO: 3
Cholesterol: 144 mg/dL (ref 0–200)
HDL: 52.8 mg/dL (ref >39.00)
LDL CALC: 62 mg/dL (ref 0–99)
NONHDL: 91.2
TRIGLYCERIDES: 147 mg/dL (ref 0.0–149.0)
VLDL: 29.4 mg/dL (ref 0.0–40.0)

## 2014-01-03 ENCOUNTER — Telehealth: Payer: Self-pay | Admitting: *Deleted

## 2014-01-03 NOTE — Telephone Encounter (Signed)
lmptcb to go over results  

## 2014-01-03 NOTE — Telephone Encounter (Signed)
pt states he say lab results in My CHART today. Pt said he is very happy with his results. I said keep up the good work.

## 2014-01-23 ENCOUNTER — Other Ambulatory Visit: Payer: Self-pay | Admitting: Cardiovascular Disease

## 2014-01-24 ENCOUNTER — Encounter: Payer: Self-pay | Admitting: Cardiovascular Disease

## 2014-01-24 ENCOUNTER — Ambulatory Visit (INDEPENDENT_AMBULATORY_CARE_PROVIDER_SITE_OTHER): Payer: BC Managed Care – PPO | Admitting: Cardiovascular Disease

## 2014-01-24 VITALS — BP 110/72 | HR 76 | Ht 74.0 in | Wt 238.8 lb

## 2014-01-24 DIAGNOSIS — I2510 Atherosclerotic heart disease of native coronary artery without angina pectoris: Secondary | ICD-10-CM

## 2014-01-24 MED ORDER — SILDENAFIL CITRATE 100 MG PO TABS: 100.0000 mg | ORAL_TABLET | Freq: Every day | ORAL | Status: DC | PRN

## 2014-01-24 NOTE — Patient Instructions (Signed)
Your physician recommends that you continue on your current medications as directed. Please refer to the Current Medication list given to you today.  Your physician wants you to follow-up in: 6 MONTHS with Dr Burt Knack.  You will receive a reminder letter in the mail two months in advance. If you don't receive a letter, please call our office to schedule the follow-up appointment.

## 2014-01-24 NOTE — Progress Notes (Signed)
Background: the patient is followed for coronary artery disease. He has undergone multiple PCI procedures in 2002. He's had chronic exertional angina. He presented about 3 months ago with progressive angina and underwent cardiac catheterization demonstrating severe ostial RCA stenosis and severe left main stenosis. The patient underwent four-vessel CABG in August 2015 with a LIMA to LAD, vein graft obtuse marginal, and sequential vein graft to the PDA and PLA branches of the RCA. He had an uncomplicated postoperative course.  HPI:  63 year old gentleman presenting for follow-up evaluation. He is doing well without symptoms of chest pain or pressure, dyspnea, edema, or heart palpitations. He has noticed a popping sound in the lower chest area with certain movements. He does not have associated pain. He has been compliant with his medical program. He's not been engaged in any regular exercise, but earlier this year when he was mowing his lawn he had no exertional symptoms.  Outpatient Encounter Prescriptions as of 01/24/2014  Medication Sig  . aspirin 81 MG tablet Take 81 mg by mouth daily.  Marland Kitchen atorvastatin (LIPITOR) 80 MG tablet Take 1 tablet (80 mg total) by mouth daily.  Marland Kitchen buPROPion (WELLBUTRIN XL) 300 MG 24 hr tablet Take 300 mg by mouth daily.  Marland Kitchen desonide (DESOWEN) 0.05 % lotion Apply 1 application topically daily. To face  . Emollient (LUBRIDERM SERIOUSLY SENSITIVE) LOTN Apply 1 application topically daily. To upper body and face  . loratadine (CLARITIN) 10 MG tablet Take 10 mg by mouth daily.  . metoprolol succinate (TOPROL-XL) 50 MG 24 hr tablet Take 50 mg by mouth daily. Take with or immediately following a meal.  . multivitamin (THERAGRAN) per tablet Take 1 tablet by mouth daily.    . nefazodone (SERZONE) 200 MG tablet Take 300 mg by mouth at bedtime.  Marland Kitchen NITROSTAT 0.4 MG SL tablet Place 0.4 mg under the tongue every 5 (five) minutes as needed.   . pantoprazole (PROTONIX) 40 MG tablet Take  1 tablet (40 mg total) by mouth daily.  . Probiotic Product (PROBIOTIC DAILY PO) Take by mouth daily. PROBIOTIC CAP  . ramipril (ALTACE) 5 MG capsule Take 1 capsule (5 mg total) by mouth daily.  . sildenafil (VIAGRA) 100 MG tablet Take 1 tablet (100 mg total) by mouth daily as needed for erectile dysfunction.  . [DISCONTINUED] isosorbide mononitrate (IMDUR) 30 MG 24 hr tablet Take 0.5 tablets (15 mg total) by mouth daily.    No Known Allergies  Past Medical History  Diagnosis Date  . Hyperlipidemia     mixed  . Spondylitis, ankylosing   . GERD (gastroesophageal reflux disease)   . Depression   . Hypertension   . Coronary artery disease     s/p multiple percutaneous interventions, PCI 202 for DMI and DES distal RCA and CFX-OM 2006  . Myocardial infarction   . Anxiety   . Arthritis     SPINE   . Anginal pain     family history includes Breast cancer in his mother; Heart attack in his brother; Heart disease in his brother, father, maternal grandfather, and another family member; Leukemia in his maternal grandmother.   ROS: Negative except as per HPI  BP 110/72 mmHg  Pulse 76  Ht 6\' 2"  (1.88 m)  Wt 238 lb 12.8 oz (108.319 kg)  BMI 30.65 kg/m2  SpO2 97%  PHYSICAL EXAM: Pt is alert and oriented, NAD HEENT: normal Neck: JVP - normal, carotids 2+= without bruits Lungs: CTA bilaterally CV: RRR without murmur or gallop Chest:  There is no obvious deformity over the sternal area with palpation. I do not appreciate a hernia around the subxiphoid area. Abd: soft, NT, Positive BS, no hepatomegaly Ext: no C/C/E, distal pulses intact and equal Skin: warm/dry no rash  ASSESSMENT AND PLAN: 1. Coronary artery disease, native vessel, without symptoms of angina. He will continue on his current medical program which includes aspirin, atorvastatin, and metoprolol. I will see him back in 6 months. He seems to be doing well, now 3 months out from CABG. I don't appreciate any significant  deformity in his lower sternal area where he feels a popping sensation. He has not particularly bothered by this, but if it worsens I advised him to contact Dr. Cyndia Bent.  2. Hyperlipidemia. Recent lipids reviewed: Lipid Panel     Component Value Date/Time   CHOL 144 12/31/2013 1206   TRIG 147.0 12/31/2013 1206   HDL 52.80 12/31/2013 1206   CHOLHDL 3 12/31/2013 1206   VLDL 29.4 12/31/2013 1206   LDLCALC 62 12/31/2013 1206   LDLDIRECT 89.7 08/01/2011 0921   He will continue on high-dose atorvastatin. We reviewed the importance of diet and routine exercise. His weight is up a few pounds over the last 2 months.  3. Essential hypertension. Blood pressure is well controlled on a combination of ramipril and metoprolol succinate.  [name], MD 01/24/2014 11:31 AM

## 2014-02-17 ENCOUNTER — Encounter (HOSPITAL_COMMUNITY): Payer: Self-pay | Admitting: Cardiovascular Disease

## 2014-02-22 ENCOUNTER — Other Ambulatory Visit: Payer: Self-pay | Admitting: Cardiovascular Disease

## 2014-07-09 ENCOUNTER — Ambulatory Visit (INDEPENDENT_AMBULATORY_CARE_PROVIDER_SITE_OTHER): Payer: BLUE CROSS/BLUE SHIELD | Admitting: Emergency Medicine

## 2014-07-09 VITALS — BP 138/78 | HR 73 | Temp 97.8°F | Resp 18 | Ht 74.5 in | Wt 246.8 lb

## 2014-07-09 DIAGNOSIS — S338XXA Sprain of other parts of lumbar spine and pelvis, initial encounter: Secondary | ICD-10-CM

## 2014-07-09 DIAGNOSIS — S39012A Strain of muscle, fascia and tendon of lower back, initial encounter: Secondary | ICD-10-CM

## 2014-07-09 MED ORDER — CYCLOBENZAPRINE HCL 10 MG PO TABS: 10.0000 mg | ORAL_TABLET | Freq: Three times a day (TID) | ORAL | Status: DC | PRN

## 2014-07-09 NOTE — Patient Instructions (Signed)
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
## 2014-07-12 ENCOUNTER — Other Ambulatory Visit: Payer: Self-pay | Admitting: Physician Assistant

## 2014-09-02 ENCOUNTER — Other Ambulatory Visit: Payer: Self-pay | Admitting: Cardiovascular Disease

## 2014-10-17 ENCOUNTER — Other Ambulatory Visit: Payer: Self-pay | Admitting: *Deleted

## 2014-10-17 MED ORDER — ATORVASTATIN CALCIUM 80 MG PO TABS: 80.0000 mg | ORAL_TABLET | Freq: Every day | ORAL | Status: DC

## 2014-10-17 MED ORDER — METOPROLOL SUCCINATE ER 50 MG PO TB24: 50.0000 mg | ORAL_TABLET | Freq: Every day | ORAL | Status: DC

## 2014-10-17 MED ORDER — RAMIPRIL 10 MG PO CAPS: 10.0000 mg | ORAL_CAPSULE | Freq: Every day | ORAL | Status: DC

## 2014-10-17 MED ORDER — PANTOPRAZOLE SODIUM 40 MG PO TBEC: 40.0000 mg | DELAYED_RELEASE_TABLET | Freq: Every day | ORAL | Status: DC

## 2014-11-01 ENCOUNTER — Telehealth: Payer: Self-pay | Admitting: Cardiovascular Disease

## 2014-11-01 NOTE — Telephone Encounter (Signed)
I spoke with the pt and made him aware that he is due to schedule follow-up with Dr Burt Knack.  Appointment scheduled on 02/20/15.

## 2014-11-01 NOTE — Telephone Encounter (Signed)
New message      Pt received prescriptions and he was told that he needed to call Dr Antionette Char office and he doesn't know why he needs to call the office Please call to discuss

## 2014-11-06 ENCOUNTER — Other Ambulatory Visit: Payer: Self-pay | Admitting: Cardiovascular Disease

## 2014-11-08 NOTE — Telephone Encounter (Signed)
Should the patient be on ramipril 5mg  or 10mg ? Please advise. Thanks, MI

## 2014-11-08 NOTE — Telephone Encounter (Signed)
I spoke with the pt and he was at home.  The pt looked at his ramipril dosage and confirmed he is taking 10mg  daily. I will send Rx into the pharmacy. The pt has a pending appointment in December with Dr Burt Knack.

## 2015-01-07 ENCOUNTER — Other Ambulatory Visit: Payer: Self-pay | Admitting: Cardiovascular Disease

## 2015-02-20 ENCOUNTER — Encounter: Payer: Self-pay | Admitting: Cardiovascular Disease

## 2015-02-20 ENCOUNTER — Ambulatory Visit (INDEPENDENT_AMBULATORY_CARE_PROVIDER_SITE_OTHER): Payer: Medicare Other | Admitting: Cardiovascular Disease

## 2015-02-20 VITALS — BP 132/94 | HR 71 | Ht 74.0 in | Wt 252.8 lb

## 2015-02-20 DIAGNOSIS — E785 Hyperlipidemia, unspecified: Secondary | ICD-10-CM | POA: Diagnosis not present

## 2015-02-20 DIAGNOSIS — I10 Essential (primary) hypertension: Secondary | ICD-10-CM

## 2015-02-20 DIAGNOSIS — I2510 Atherosclerotic heart disease of native coronary artery without angina pectoris: Secondary | ICD-10-CM | POA: Diagnosis not present

## 2015-02-20 MED ORDER — RAMIPRIL 10 MG PO CAPS: 10.0000 mg | ORAL_CAPSULE | Freq: Two times a day (BID) | ORAL | Status: DC

## 2015-02-20 NOTE — Patient Instructions (Addendum)
Medication Instructions:  Your physician has recommended you make the following change in your medication:  1. INCREASE Ramipril to 10mg  take one capsule by mouth twice a day  Labwork: Your physician recommends that you return for a FASTING CMP and LIPID--nothing to eat or drink after midnight, lab opens at 7:30 AM (03/09/15)  Testing/Procedures: No new orders.   Follow-Up: Your physician wants you to follow-up in: 1 YEAR with Dr Burt Knack.  You will receive a reminder letter in the mail two months in advance. If you don't receive a letter, please call our office to schedule the follow-up appointment.   Any Other Special Instructions Will Be Listed Below (If Applicable).     If you need a refill on your cardiac medications before your next appointment, please call your pharmacy.

## 2015-02-20 NOTE — Progress Notes (Signed)
Cardiology Office Note Date:  02/20/2015   ID:  Gregory Hansen, DOB 07-04-1950, MRN [id number]  PCP:  No PCP Per Patient  Cardiologist:  [name], MD    Chief Complaint  Patient presents with  . Annual Exam   History of Present Illness: Gregory Hansen is a 64 y.o. male who presents for follow-up of coronary artery disease. He has undergone multiple PCI procedures in 2002. He's had chronic exertional angina. He presented in 2015 with progressive angina and underwent cardiac catheterization demonstrating severe ostial RCA stenosis and severe left main stenosis. The patient underwent four-vessel CABG in August 2015 with a LIMA to LAD, vein graft obtuse marginal, and sequential vein graft to the PDA and PLA branches of the RCA. He had an uncomplicated postoperative course.  The patient is doing well from a cardiac perspective. He denies any recurrent anginal symptoms. Cystically denies chest pain, chest pressure, or shortness of breath. He has developed problems with tingling and numbness in his feet. He's also developed chronic right leg swelling. He denies shortness of breath, orthopnea, or PND.   Past Medical History  Diagnosis Date  . Hyperlipidemia     mixed  . Spondylitis, ankylosing ([place])   . GERD (gastroesophageal reflux disease)   . Depression   . Hypertension   . Coronary artery disease     s/p multiple percutaneous interventions, PCI 202 for DMI and DES distal RCA and CFX-OM 2006  . Myocardial infarction ([place])   . Anxiety   . Arthritis     SPINE   . Anginal pain [facility])     Past Surgical History  Procedure Laterality Date  . Cardiac catheterization      stent RCA June3, 2002, Stent OM/PTCA circumflex August 13, 2000  . Coronary angioplasty with stent placement      first one in 2006; had another place 3 months ago (August 2013)  . Coronary angioplasty with stent placement  03/26/2013    RCA           DR Zykerria Tanton  . Finger surgery      5  TH     DIGIT RIGHT HAND    . Coronary artery bypass graft N/A 10/29/2013    Procedure: CORONARY ARTERY BYPASS GRAFTING x 4 (LIMA-LAD, SVG-OM, SVG-PD-PL) ENDOSCOPIC VEIN HARVEST RIGHT THIGH;  Surgeon: Gaye Pollack, MD;  Location: [place];  Service: Open Heart Surgery;  Laterality: N/A;  . Intraoperative transesophageal echocardiogram N/A 10/29/2013    Procedure: INTRAOPERATIVE TRANSESOPHAGEAL ECHOCARDIOGRAM;  Surgeon: Gaye Pollack, MD;  Location[facility];  Service: Open Heart Surgery;  Laterality: N/A;  . Left heart catheterization with coronary angiogram N/A 10/01/2011    Procedure: LEFT HEART CATHETERIZATION WITH CORONARY ANGIOGRAM;  Surgeon: [name], MD;  Location: [facility] CATH LAB;  Service: Cardiovascular;  Laterality: N/A;  . Left heart catheterization with coronary angiogram N/A 03/26/2013    Procedure: LEFT HEART CATHETERIZATION WITH CORONARY ANGIOGRAM;  Surgeon: Blane Ohara, MD;  Location: [facility] CATH LAB;  Service: Cardiovascular;  Laterality: N/A;  . Left heart catheterization with coronary angiogram N/A 10/25/2013    Procedure: LEFT HEART CATHETERIZATION WITH CORONARY ANGIOGRAM;  Surgeon: Blane Ohara, MD;  Location: [facility] CATH LAB;  Service: Cardiovascular;  Laterality: N/A;    Current Outpatient Prescriptions  Medication Sig Dispense Refill  . aspirin 81 MG tablet Take 81 mg by mouth daily.    Marland Kitchen atorvastatin (LIPITOR) 80 MG tablet Take 1 tablet by mouth  daily 90  tablet 0  . buPROPion (WELLBUTRIN XL) 300 MG 24 hr tablet Take 300 mg by mouth daily.    Marland Kitchen desonide (DESOWEN) 0.05 % lotion Apply 1 application topically daily. To face    . Emollient (LUBRIDERM SERIOUSLY SENSITIVE) LOTN Apply 1 application topically daily. To upper body and face    . loratadine (CLARITIN) 10 MG tablet Take 10 mg by mouth daily.    . metoprolol succinate (TOPROL-XL) 50 MG 24 hr tablet Take 1 tablet by mouth  daily with or immediately  following a meal 90 tablet 0  . multivitamin (THERAGRAN) per tablet Take 1 tablet by mouth daily.       . nefazodone (SERZONE) 200 MG tablet Take 300 mg by mouth at bedtime.    . nitroGLYCERIN (NITROSTAT) 0.4 MG SL tablet Place 0.4 mg under the tongue every 5 (five) minutes as needed for chest pain. Up to 3 doses    . pantoprazole (PROTONIX) 40 MG tablet Take 1 tablet by mouth  daily 90 tablet 0  . Probiotic Product (PROBIOTIC DAILY PO) Take 1 capsule by mouth daily. PROBIOTIC CAP    . ramipril (ALTACE) 10 MG capsule Take 1 capsule (10 mg total) by mouth 2 (two) times daily. 180 capsule 3  . sildenafil (VIAGRA) 100 MG tablet Take 1 tablet (100 mg total) by mouth daily as needed for erectile dysfunction. 10 tablet 2   No current facility-administered medications for this visit.    Allergies:   Review of patient's allergies indicates no known allergies.   Social History:  The patient  reports that he quit smoking about 32 years ago. He has never used smokeless tobacco. He reports that he drinks about 8.4 oz of alcohol per week. He reports that he does not use illicit drugs.   Family History:  The patient's  family history includes Breast cancer in his mother; Heart attack in his brother; Heart disease in his brother, father, maternal grandfather, and another family member; Leukemia in his maternal grandmother.    ROS:  Please see the history of present illness.  Otherwise, review of systems is positive for tingling/numbness in the feet, right leg swelling.  All other systems are reviewed and negative.    PHYSICAL EXAM: VS:  BP 132/94 mmHg  Pulse 71  Ht '6\' 2"'  (1.88 m)  Wt 252 lb 12.8 oz (114.669 kg)  BMI 32.44 kg/m2 , BMI Body mass index is 32.44 kg/(m^2). GEN: Well nourished, well developed, in no acute distress HEENT: normal Neck: no JVD, no masses. No carotid bruits Cardiac: RRR without murmur or gallop                Respiratory:  clear to auscultation bilaterally, normal work of breathing GI: soft, nontender, nondistended, + BS, obese MS: no deformity or atrophy Ext: no pretibial  edema, pedal pulses 2+= bilaterally Skin: warm and dry, no rash Neuro:  Strength and sensation are intact Psych: euthymic mood, full affect  EKG:  EKG is ordered today. The ekg ordered today shows NSR 71 bpm, incomplete RBBB, no ST-T changes  Recent Labs: No results found for requested labs within last 365 days.   Lipid Panel     Component Value Date/Time   CHOL 144 12/31/2013 1206   TRIG 147.0 12/31/2013 1206   HDL 52.80 12/31/2013 1206   CHOLHDL 3 12/31/2013 1206   VLDL 29.4 12/31/2013 1206   LDLCALC 62 12/31/2013 1206   LDLDIRECT 89.7 08/01/2011 0921      Wt Readings  from Last 3 Encounters:  02/20/15 252 lb 12.8 oz (114.669 kg)  07/09/14 246 lb 12.8 oz (111.948 kg)  01/24/14 238 lb 12.8 oz (108.319 kg)    ASSESSMENT AND PLAN: 1.   CAD, native vessel, without symptoms of angina: The patient is now out approximal 18 months from CABG. He has no recurrence of angina. He will continue on aggressive risk reduction with aspirin , a beta blocker, an ACE inhibitor, and a statin drug.  2. Essential hypertension, uncontrolled: blood pressure on my repeat check is 150/85. I recommended that he increase ramipril to 10 mg twice daily. Will update a metabolic panel. The importance of sodium restriction is reviewed. The importance of weight loss and exercise are stressed with the patient today.  3. Hyperlipidemia: He continues on atorvastatin 80 mg daily. We will update his lipid panel.  Current medicines are reviewed with the patient today.  The patient does not have concerns regarding medicines.  Labs/ tests ordered today include:   Orders Placed This Encounter  Procedures  . Comp Met (CMET)  . Lipid panel  . EKG 12-Lead   Disposition:   FU one year  Signed, [name], MD  02/20/2015 4:23 PM    [facility] [address], [place], [place]  [address] Phone: [phone]; Fax: [phone]

## 2015-03-09 ENCOUNTER — Other Ambulatory Visit: Payer: Medicare Other

## 2015-03-23 ENCOUNTER — Other Ambulatory Visit (INDEPENDENT_AMBULATORY_CARE_PROVIDER_SITE_OTHER): Payer: Medicare Other | Admitting: *Deleted

## 2015-03-23 DIAGNOSIS — I2510 Atherosclerotic heart disease of native coronary artery without angina pectoris: Secondary | ICD-10-CM

## 2015-03-23 DIAGNOSIS — I10 Essential (primary) hypertension: Secondary | ICD-10-CM

## 2015-03-23 DIAGNOSIS — E785 Hyperlipidemia, unspecified: Secondary | ICD-10-CM

## 2015-03-23 LAB — COMPREHENSIVE METABOLIC PANEL
ALT: 61 U/L — ABNORMAL HIGH (ref 9–46)
AST: 80 U/L — ABNORMAL HIGH (ref 10–35)
Albumin: 3.6 g/dL (ref 3.6–5.1)
Alkaline Phosphatase: 87 U/L (ref 40–115)
BUN: 9 mg/dL (ref 7–25)
CALCIUM: 9.1 mg/dL (ref 8.6–10.3)
CHLORIDE: 102 mmol/L (ref 98–110)
CO2: 25 mmol/L (ref 20–31)
Creat: 0.75 mg/dL (ref 0.70–1.25)
GLUCOSE: 101 mg/dL — AB (ref 65–99)
POTASSIUM: 4.9 mmol/L (ref 3.5–5.3)
Sodium: 135 mmol/L (ref 135–146)
Total Bilirubin: 0.8 mg/dL (ref 0.2–1.2)
Total Protein: 7.4 g/dL (ref 6.1–8.1)

## 2015-03-23 LAB — LIPID PANEL
CHOL/HDL RATIO: 2.8 ratio (ref <=5.0)
Cholesterol: 115 mg/dL — ABNORMAL LOW (ref 125–200)
HDL: 41 mg/dL (ref >=40)
LDL CALC: 49 mg/dL (ref <130)
Triglycerides: 125 mg/dL (ref <150)
VLDL: 25 mg/dL (ref <30)

## 2015-03-23 NOTE — Addendum Note (Signed)
Addended by: Eulis Foster on: 03/23/2015 07:46 AM   Modules accepted: Orders

## 2015-03-24 ENCOUNTER — Telehealth: Payer: Self-pay | Admitting: Cardiovascular Disease

## 2015-03-24 DIAGNOSIS — E785 Hyperlipidemia, unspecified: Secondary | ICD-10-CM

## 2015-03-24 NOTE — Telephone Encounter (Signed)
New message      Returning a call to Dr Burt Knack to get lab results

## 2015-03-24 NOTE — Telephone Encounter (Signed)
Spoke with patient about recent lab results 

## 2015-04-15 ENCOUNTER — Other Ambulatory Visit: Payer: Self-pay | Admitting: Cardiovascular Disease

## 2015-04-27 IMAGING — CR DG CHEST 1V PORT
2 series · 2 of 2 positions shown · non-contrast
Comparison: Prior chest x-ray 10/29/2013

CLINICAL DATA: Postop CABG

EXAM:
PORTABLE CHEST - 1 VIEW

[portable (1 of 2)]
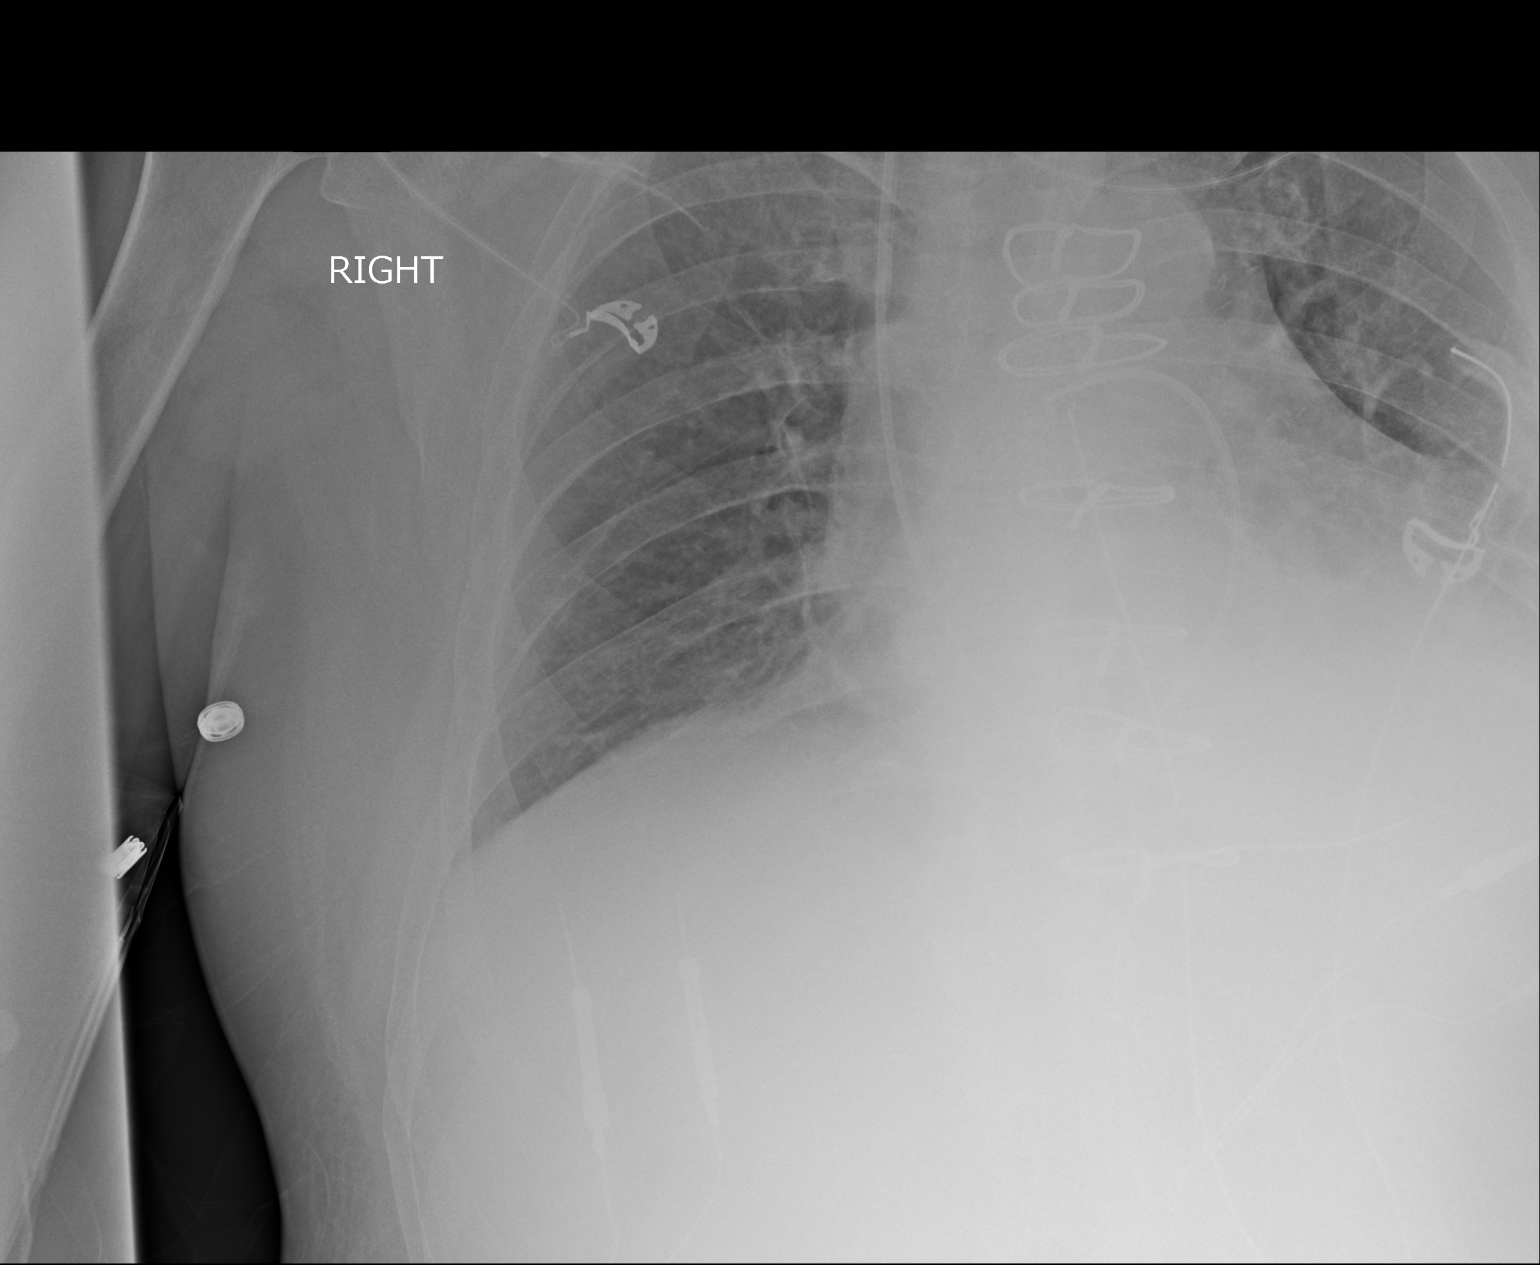

[portable (2 of 2)]
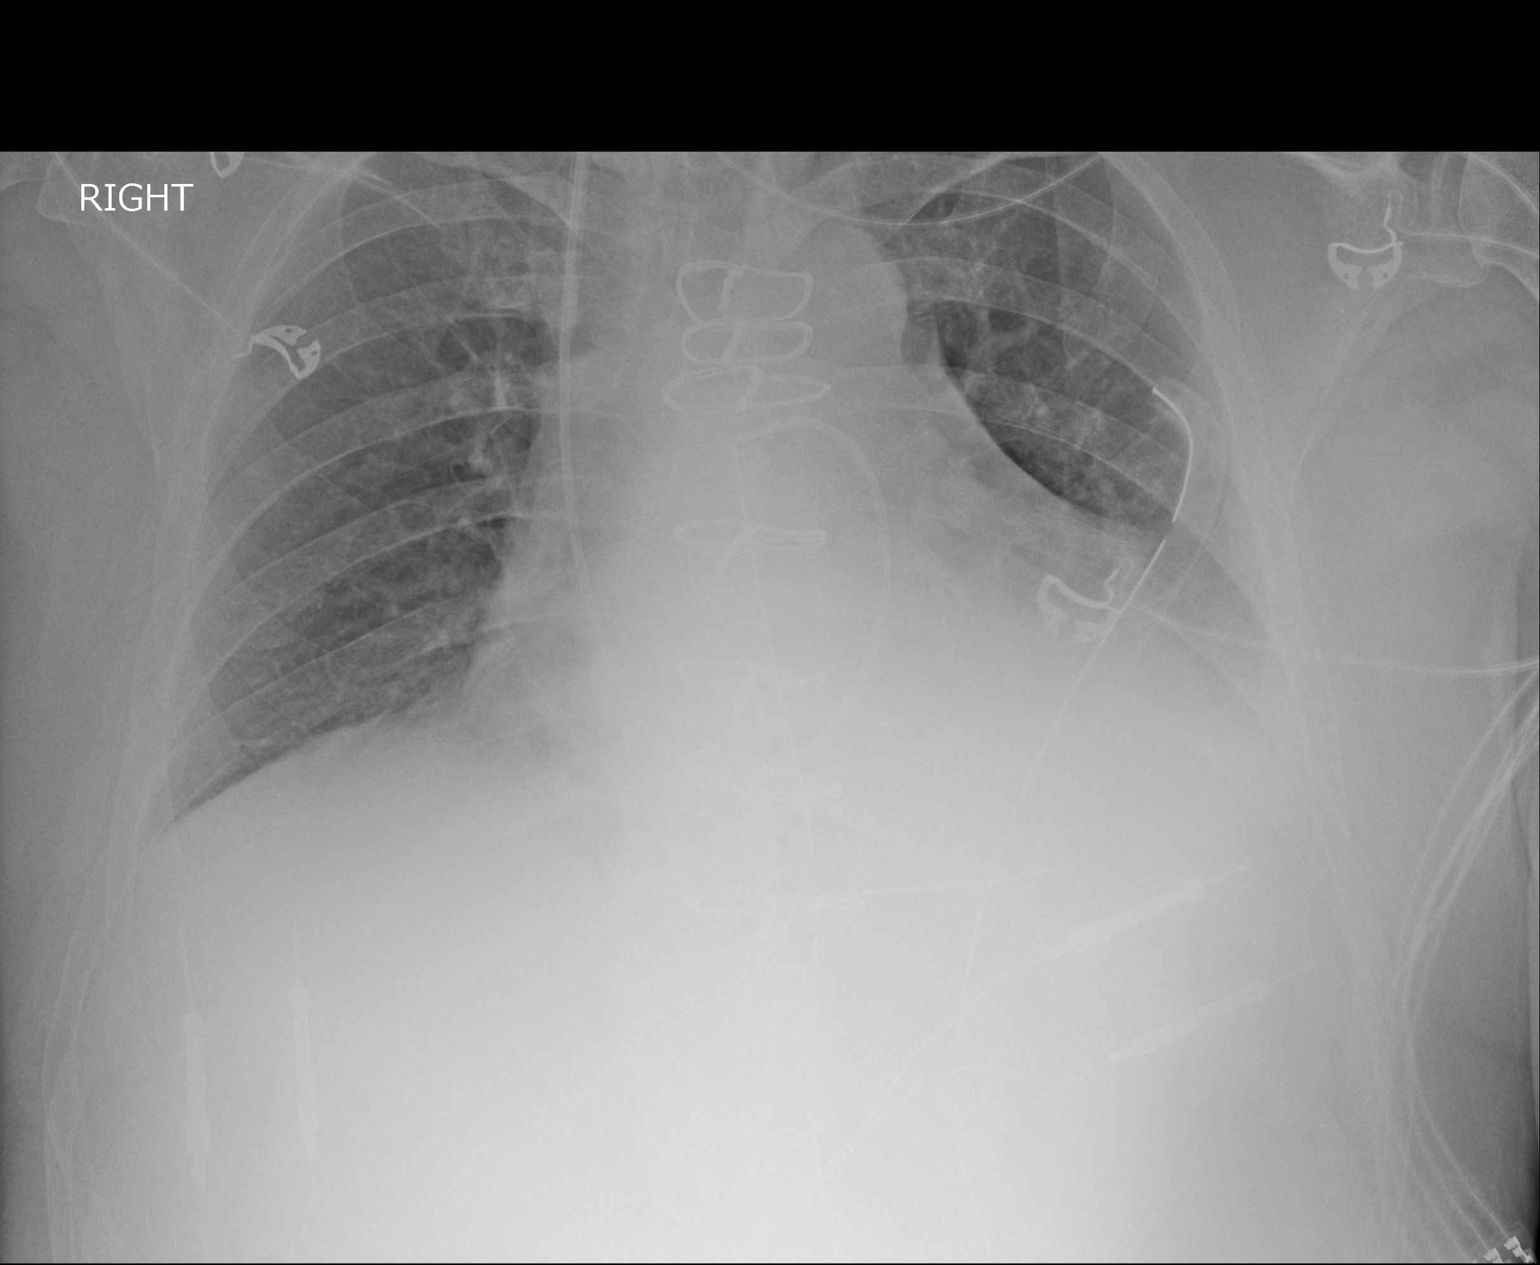

[2 of 2 positions shown; findings below may reference images not displayed]

FINDINGS: Interval extubation and removal of nasogastric tube. Right IJ
vascular sheath continues to convey a Swan-Ganz catheter into the
heart. The tip of the Klever projects over the proximal right main
pulmonary artery. Mediastinal and left-sided chest tubes remain in
good position. No evidence of pneumothorax. Stable inspiratory
volumes and mild vascular congestion without overt edema. Persistent
left basilar opacity. No pneumothorax. Stable cardiac and
mediastinal contours.
IMPRESSION: 1. Interval extubation and removal of nasogastric tube.
2. Other support apparatus in stable and satisfactory position.
3. No significant interval change in the appearance of the lungs.

## 2015-05-25 ENCOUNTER — Other Ambulatory Visit (INDEPENDENT_AMBULATORY_CARE_PROVIDER_SITE_OTHER): Payer: Medicare Other | Admitting: *Deleted

## 2015-05-25 DIAGNOSIS — E785 Hyperlipidemia, unspecified: Secondary | ICD-10-CM | POA: Diagnosis not present

## 2015-05-25 LAB — HEPATIC FUNCTION PANEL
ALK PHOS: 85 U/L (ref 40–115)
ALT: 58 U/L — AB (ref 9–46)
AST: 74 U/L — ABNORMAL HIGH (ref 10–35)
Albumin: 3.7 g/dL (ref 3.6–5.1)
BILIRUBIN DIRECT: 0.3 mg/dL — AB (ref <=0.2)
BILIRUBIN INDIRECT: 0.7 mg/dL (ref 0.2–1.2)
TOTAL PROTEIN: 7.1 g/dL (ref 6.1–8.1)
Total Bilirubin: 1 mg/dL (ref 0.2–1.2)

## 2015-05-25 LAB — LIPID PANEL
CHOL/HDL RATIO: 2.8 ratio (ref <=5.0)
Cholesterol: 124 mg/dL — ABNORMAL LOW (ref 125–200)
HDL: 44 mg/dL (ref >=40)
LDL CALC: 51 mg/dL (ref <130)
TRIGLYCERIDES: 143 mg/dL (ref <150)
VLDL: 29 mg/dL (ref <30)

## 2015-05-25 NOTE — Addendum Note (Signed)
Addended by: Eulis Foster on: 05/25/2015 09:36 AM   Modules accepted: Orders

## 2015-05-29 ENCOUNTER — Other Ambulatory Visit: Payer: Self-pay | Admitting: Cardiovascular Disease

## 2015-05-29 IMAGING — CR DG CHEST 2V
2 series · 2 of 2 positions shown · non-contrast
Comparison: Portable chest x-ray October 31, 2013

CLINICAL DATA: Status post CABG on October 29, 2013 currently
asymptomatic

EXAM:
CHEST  2 VIEW

[w chest pa]
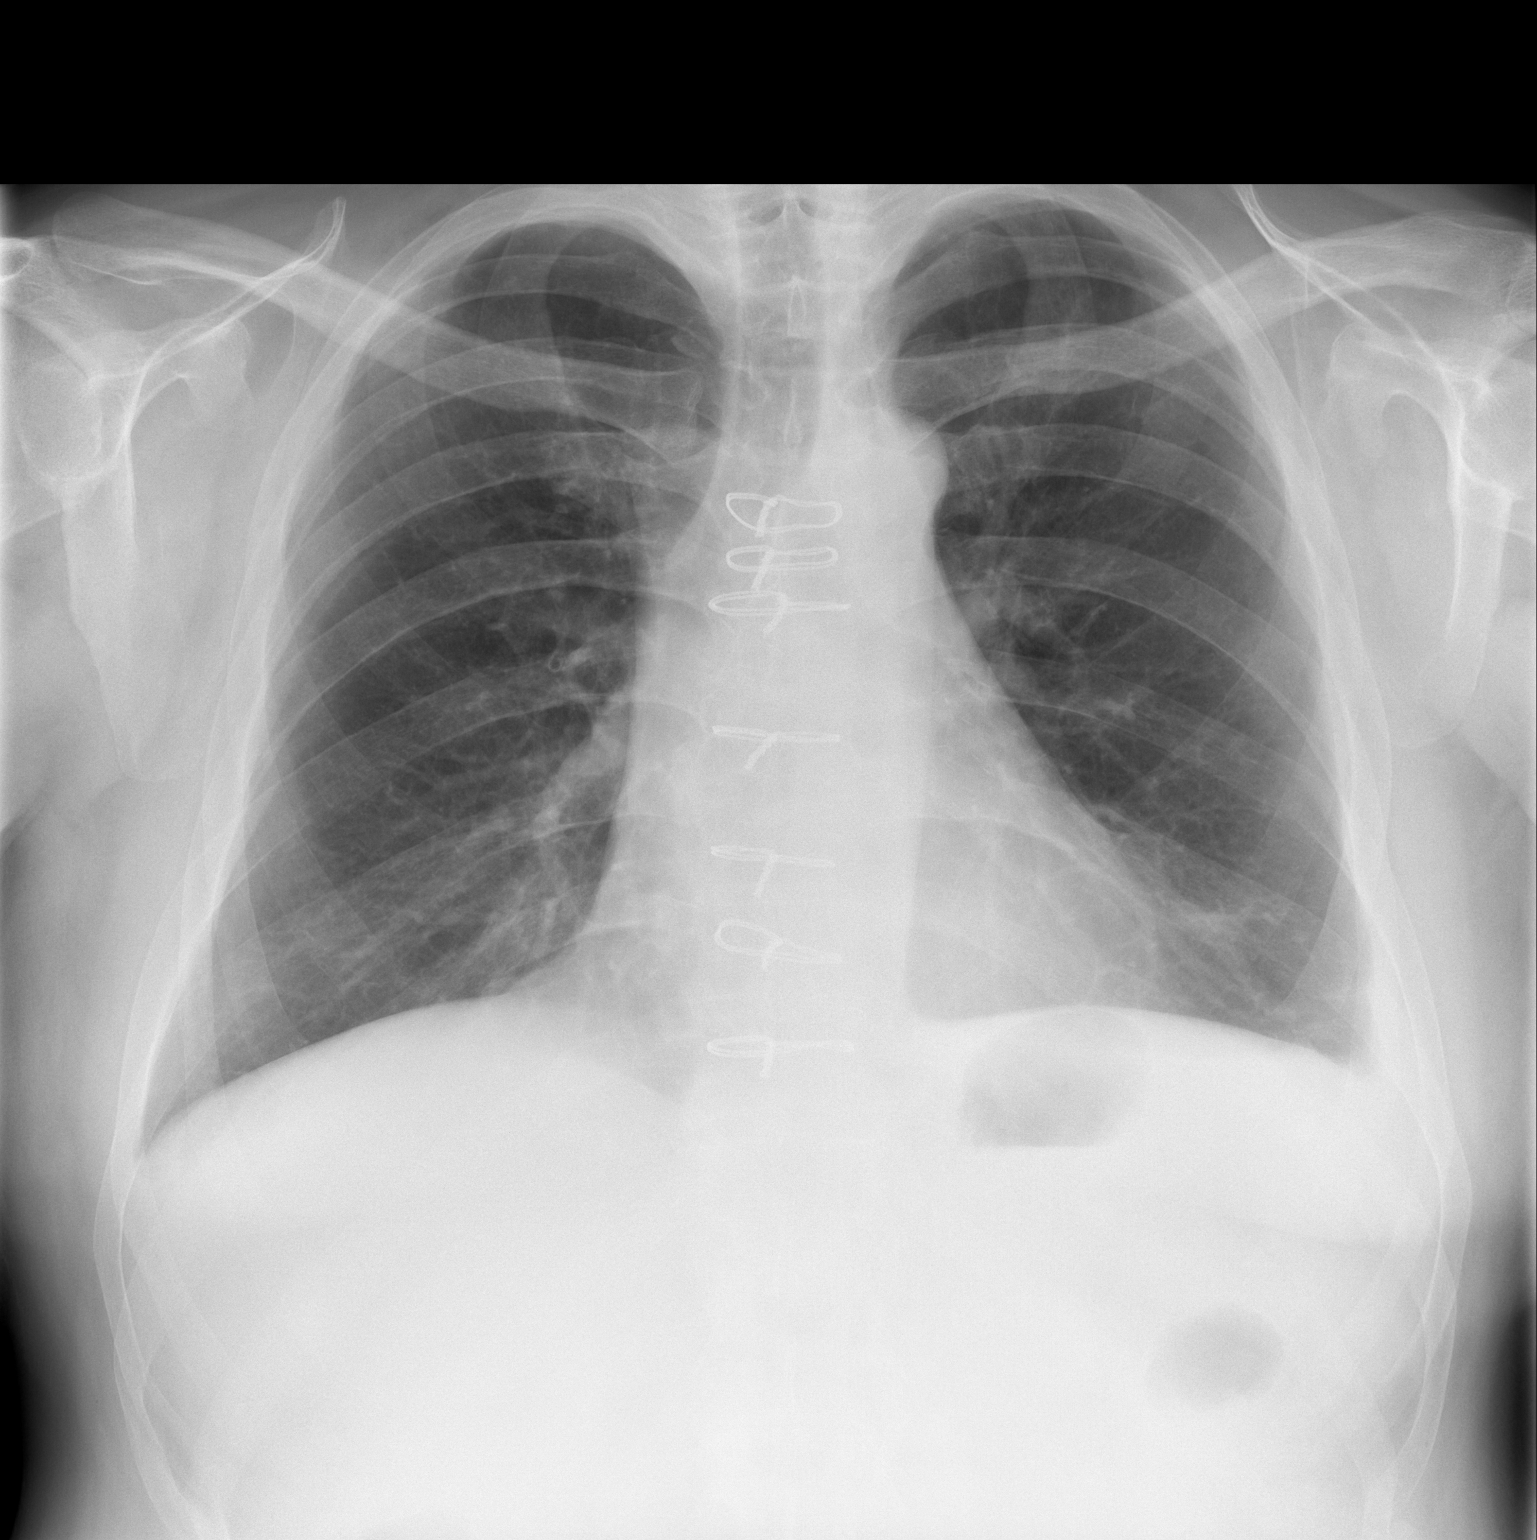

[w chest lat]
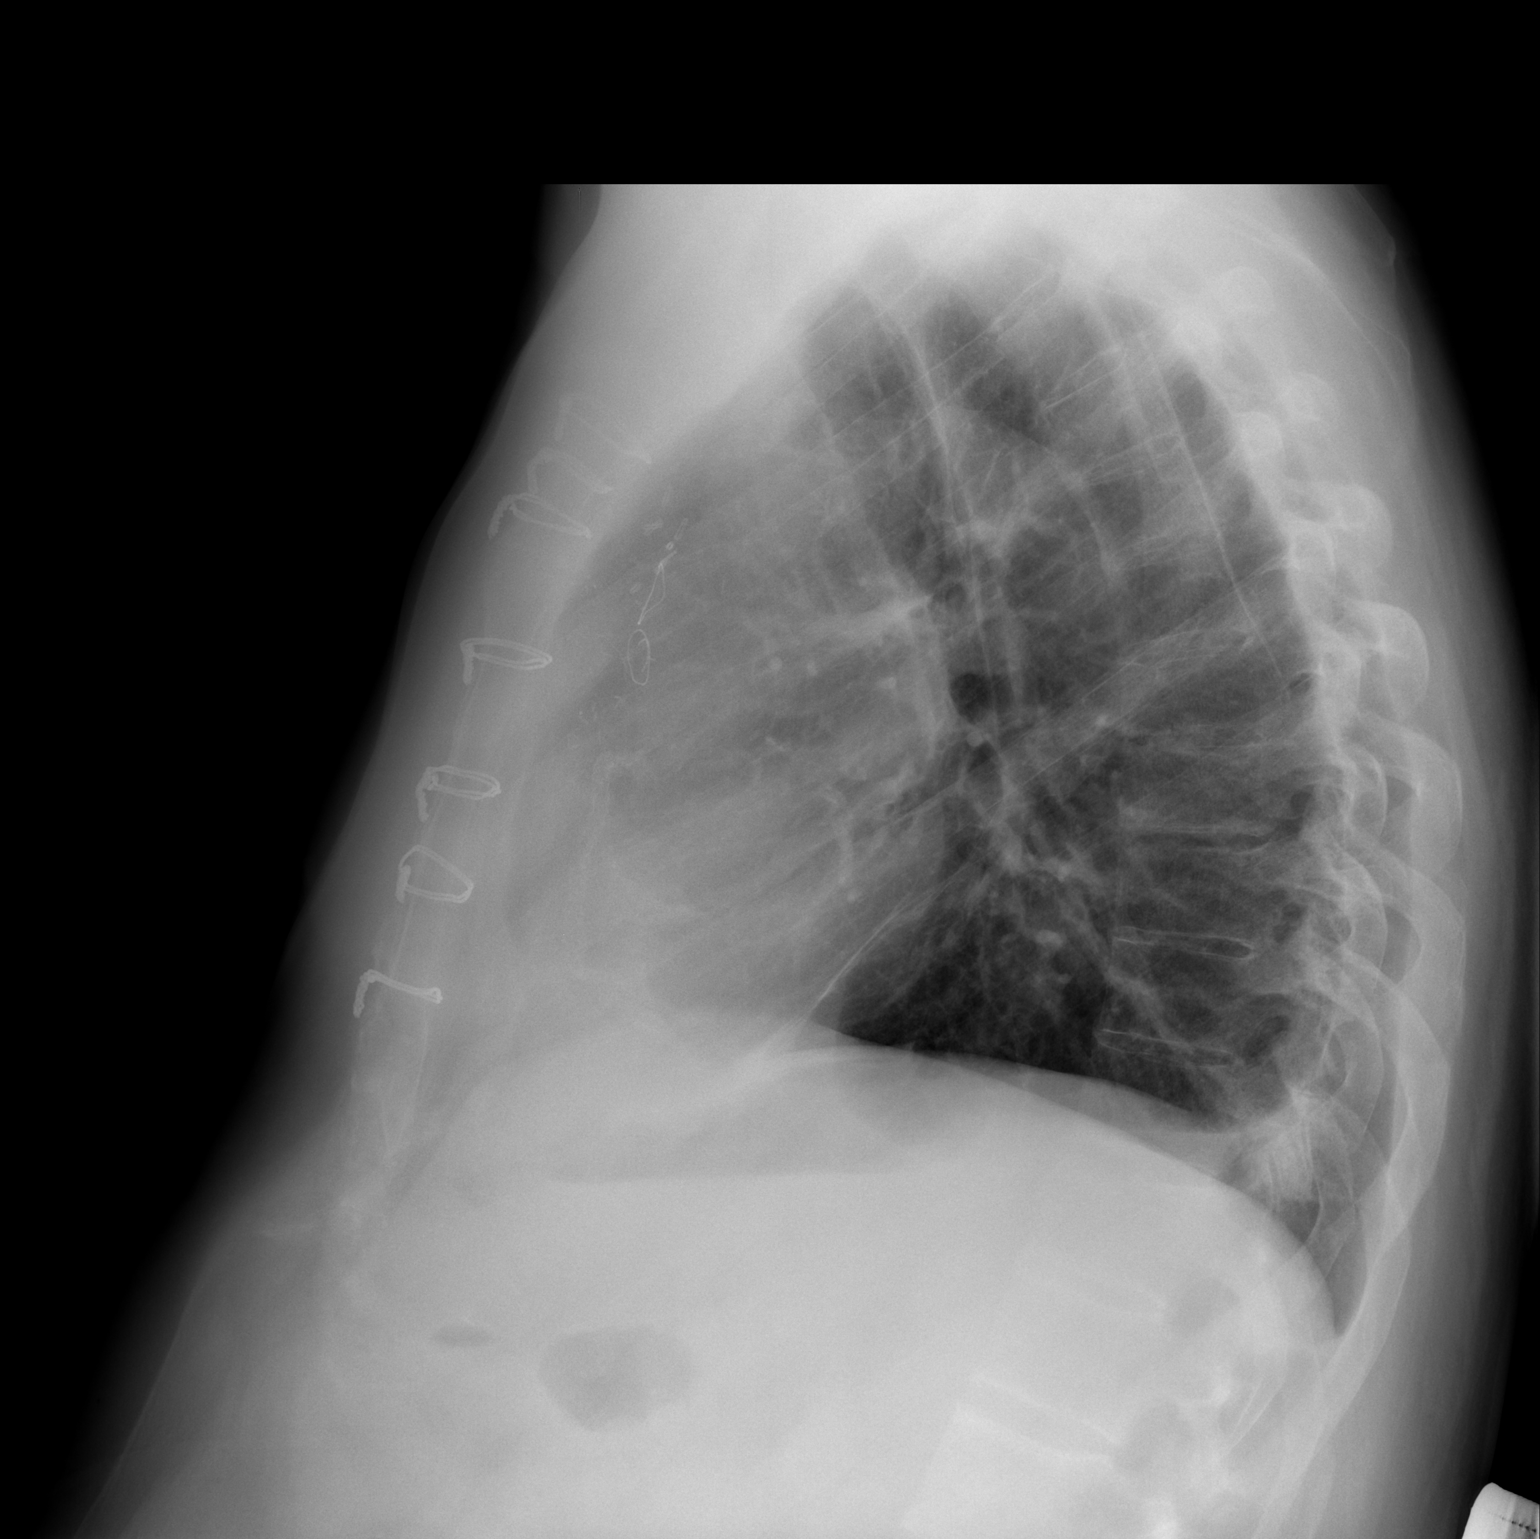

[2 of 2 positions shown; findings below may reference images not displayed]

FINDINGS: The lungs are well-expanded. There is a persistent small amount of
pleural thickening or scarring along the left lower lateral thoracic
wall. The cardiac silhouette is normal in size. The pulmonary
vascularity is not engorged. The mediastinum is normal in width.
There are 7 intact sternal wires. The bony thorax is unremarkable.
IMPRESSION: There is minimal residual scarring at the left lung base. There is
no evidence of CHF nor other acute cardiopulmonary abnormality.

## 2015-05-31 ENCOUNTER — Other Ambulatory Visit: Payer: Self-pay

## 2015-05-31 DIAGNOSIS — R748 Abnormal levels of other serum enzymes: Secondary | ICD-10-CM

## 2015-06-30 ENCOUNTER — Emergency Department (HOSPITAL_COMMUNITY): Payer: Medicare Other

## 2015-06-30 ENCOUNTER — Encounter (HOSPITAL_COMMUNITY): Payer: Self-pay | Admitting: *Deleted

## 2015-06-30 ENCOUNTER — Inpatient Hospital Stay (HOSPITAL_COMMUNITY)
Admission: EM | Admit: 2015-06-30 | Discharge: 2015-07-01 | DRG: 159 | Disposition: A | Discharge status: Home / Self Care | Payer: Medicare Other | Attending: Neurosurgery | Admitting: Neurosurgery

## 2015-06-30 DIAGNOSIS — S0083XA Contusion of other part of head, initial encounter: Secondary | ICD-10-CM | POA: Diagnosis not present

## 2015-06-30 DIAGNOSIS — Z7982 Long term (current) use of aspirin: Secondary | ICD-10-CM

## 2015-06-30 DIAGNOSIS — S062X9A Diffuse traumatic brain injury with loss of consciousness of unspecified duration, initial encounter: Secondary | ICD-10-CM

## 2015-06-30 DIAGNOSIS — F10120 Alcohol abuse with intoxication, uncomplicated: Secondary | ICD-10-CM | POA: Diagnosis not present

## 2015-06-30 DIAGNOSIS — S0990XA Unspecified injury of head, initial encounter: Secondary | ICD-10-CM | POA: Diagnosis present

## 2015-06-30 DIAGNOSIS — S0689AA Other specified intracranial injury with loss of consciousness status unknown, initial encounter: Secondary | ICD-10-CM

## 2015-06-30 DIAGNOSIS — E785 Hyperlipidemia, unspecified: Secondary | ICD-10-CM | POA: Diagnosis not present

## 2015-06-30 DIAGNOSIS — Z951 Presence of aortocoronary bypass graft: Secondary | ICD-10-CM

## 2015-06-30 DIAGNOSIS — W19XXXA Unspecified fall, initial encounter: Secondary | ICD-10-CM | POA: Diagnosis present

## 2015-06-30 DIAGNOSIS — Z23 Encounter for immunization: Secondary | ICD-10-CM | POA: Diagnosis not present

## 2015-06-30 DIAGNOSIS — K219 Gastro-esophageal reflux disease without esophagitis: Secondary | ICD-10-CM | POA: Diagnosis not present

## 2015-06-30 DIAGNOSIS — I252 Old myocardial infarction: Secondary | ICD-10-CM

## 2015-06-30 DIAGNOSIS — I2510 Atherosclerotic heart disease of native coronary artery without angina pectoris: Secondary | ICD-10-CM | POA: Diagnosis present

## 2015-06-30 DIAGNOSIS — I10 Essential (primary) hypertension: Secondary | ICD-10-CM | POA: Diagnosis present

## 2015-06-30 DIAGNOSIS — S0240DA Maxillary fracture, left side, initial encounter for closed fracture: Secondary | ICD-10-CM | POA: Diagnosis present

## 2015-06-30 DIAGNOSIS — Y908 Blood alcohol level of 240 mg/100 ml or more: Secondary | ICD-10-CM | POA: Diagnosis not present

## 2015-06-30 DIAGNOSIS — F10920 Alcohol use, unspecified with intoxication, uncomplicated: Secondary | ICD-10-CM

## 2015-06-30 DIAGNOSIS — Z955 Presence of coronary angioplasty implant and graft: Secondary | ICD-10-CM | POA: Diagnosis not present

## 2015-06-30 DIAGNOSIS — S06330A Contusion and laceration of cerebrum, unspecified, without loss of consciousness, initial encounter: Secondary | ICD-10-CM | POA: Diagnosis present

## 2015-06-30 HISTORY — DX: Acute myocardial infarction, unspecified: I21.9

## 2015-06-30 LAB — CBC
HCT: 42 % (ref 39.0–52.0)
HEMOGLOBIN: 13.8 g/dL (ref 13.0–17.0)
MCH: 32.2 pg (ref 26.0–34.0)
MCHC: 32.9 g/dL (ref 30.0–36.0)
MCV: 98.1 fL (ref 78.0–100.0)
Platelets: 185 10*3/uL (ref 150–400)
RBC: 4.28 MIL/uL (ref 4.22–5.81)
RDW: 13.5 % (ref 11.5–15.5)
WBC: 6.4 10*3/uL (ref 4.0–10.5)

## 2015-06-30 LAB — COMPREHENSIVE METABOLIC PANEL
ALT: 74 U/L — ABNORMAL HIGH (ref 17–63)
ANION GAP: 12 (ref 5–15)
AST: 84 U/L — ABNORMAL HIGH (ref 15–41)
Albumin: 3.1 g/dL — ABNORMAL LOW (ref 3.5–5.0)
Alkaline Phosphatase: 83 U/L (ref 38–126)
BILIRUBIN TOTAL: 0.6 mg/dL (ref 0.3–1.2)
BUN: 12 mg/dL (ref 6–20)
CALCIUM: 8.7 mg/dL — AB (ref 8.9–10.3)
CO2: 23 mmol/L (ref 22–32)
Chloride: 99 mmol/L — ABNORMAL LOW (ref 101–111)
Creatinine, Ser: 0.9 mg/dL (ref 0.61–1.24)
GFR calc non Af Amer: >60 mL/min (ref >60)
Glucose, Bld: 99 mg/dL (ref 65–99)
Potassium: 4.4 mmol/L (ref 3.5–5.1)
Sodium: 134 mmol/L — ABNORMAL LOW (ref 135–145)
TOTAL PROTEIN: 7 g/dL (ref 6.5–8.1)

## 2015-06-30 LAB — I-STAT CHEM 8, ED
BUN: 15 mg/dL (ref 6–20)
CALCIUM ION: 1.09 mmol/L — AB (ref 1.13–1.30)
CHLORIDE: 96 mmol/L — AB (ref 101–111)
Creatinine, Ser: 1.2 mg/dL (ref 0.61–1.24)
GLUCOSE: 94 mg/dL (ref 65–99)
HCT: 47 % (ref 39.0–52.0)
Hemoglobin: 16 g/dL (ref 13.0–17.0)
Potassium: 4.5 mmol/L (ref 3.5–5.1)
Sodium: 135 mmol/L (ref 135–145)
TCO2: 26 mmol/L (ref 0–100)

## 2015-06-30 LAB — ETHANOL: ALCOHOL ETHYL (B): 261 mg/dL — AB (ref <5)

## 2015-06-30 LAB — PROTIME-INR
INR: 1.24 (ref 0.00–1.49)
PROTHROMBIN TIME: 15.8 s — AB (ref 11.6–15.2)

## 2015-06-30 LAB — SAMPLE TO BLOOD BANK

## 2015-06-30 LAB — CDS SEROLOGY

## 2015-06-30 LAB — MRSA PCR SCREENING: MRSA BY PCR: POSITIVE — AB

## 2015-06-30 LAB — I-STAT TROPONIN, ED: TROPONIN I, POC: 0 ng/mL (ref 0.00–0.08)

## 2015-06-30 MED ORDER — METOPROLOL SUCCINATE ER 50 MG PO TB24
50.0000 mg | ORAL_TABLET | Freq: Every day | ORAL | Status: DC
  Administered 2015-07-01: 50 mg via ORAL
  Filled 2015-06-30: qty 1

## 2015-06-30 MED ORDER — MUPIROCIN 2 % EX OINT
1.0000 "application " | TOPICAL_OINTMENT | Freq: Two times a day (BID) | CUTANEOUS | Status: DC
  Administered 2015-06-30 – 2015-07-01 (×2): 1 via NASAL
  Filled 2015-06-30: qty 22

## 2015-06-30 MED ORDER — ATORVASTATIN CALCIUM 40 MG PO TABS
40.0000 mg | ORAL_TABLET | Freq: Every day | ORAL | Status: DC
  Administered 2015-06-30: 40 mg via ORAL
  Filled 2015-06-30 (×2): qty 1

## 2015-06-30 MED ORDER — HYDROCODONE-ACETAMINOPHEN 5-325 MG PO TABS
1.0000 | ORAL_TABLET | ORAL | Status: DC | PRN
  Administered 2015-06-30 – 2015-07-01 (×3): 1 via ORAL
  Filled 2015-06-30 (×3): qty 1

## 2015-06-30 MED ORDER — NEFAZODONE HCL 150 MG PO TABS
300.0000 mg | ORAL_TABLET | Freq: Every day | ORAL | Status: DC
  Administered 2015-06-30: 300 mg via ORAL
  Filled 2015-06-30: qty 2

## 2015-06-30 MED ORDER — SODIUM CHLORIDE 0.9 % IV SOLN
INTRAVENOUS | Status: DC
  Administered 2015-06-30 – 2015-07-01 (×2): via INTRAVENOUS

## 2015-06-30 MED ORDER — ONDANSETRON HCL 4 MG/2ML IJ SOLN: 4.0000 mg | Freq: Four times a day (QID) | INTRAMUSCULAR | Status: DC

## 2015-06-30 MED ORDER — CHLORHEXIDINE GLUCONATE CLOTH 2 % EX PADS
6.0000 | MEDICATED_PAD | Freq: Every day | CUTANEOUS | Status: DC
  Administered 2015-07-01: 6 via TOPICAL

## 2015-06-30 MED ORDER — SODIUM CHLORIDE 0.9 % IV SOLN
INTRAVENOUS | Status: AC | PRN
  Administered 2015-06-30: 1000 mL via INTRAVENOUS

## 2015-06-30 MED ORDER — FLUTICASONE PROPIONATE 50 MCG/ACT NA SUSP
1.0000 | Freq: Every day | NASAL | Status: DC
  Administered 2015-06-30 – 2015-07-01 (×2): 1 via NASAL
  Filled 2015-06-30: qty 16

## 2015-06-30 MED ORDER — PNEUMOCOCCAL VAC POLYVALENT 25 MCG/0.5ML IJ INJ
0.5000 mL | INJECTION | INTRAMUSCULAR | Status: AC
  Administered 2015-07-01: 0.5 mL via INTRAMUSCULAR
  Filled 2015-06-30: qty 0.5

## 2015-06-30 MED ORDER — TETANUS-DIPHTH-ACELL PERTUSSIS 5-2.5-18.5 LF-MCG/0.5 IM SUSP
0.5000 mL | Freq: Once | INTRAMUSCULAR | Status: AC
  Administered 2015-06-30: 0.5 mL via INTRAMUSCULAR

## 2015-06-30 MED ORDER — MORPHINE SULFATE (PF) 2 MG/ML IV SOLN
2.0000 mg | Freq: Once | INTRAVENOUS | Status: AC
  Administered 2015-06-30: 2 mg via INTRAVENOUS
  Filled 2015-06-30: qty 1

## 2015-06-30 MED ORDER — ONDANSETRON HCL 4 MG PO TABS: 4.0000 mg | ORAL_TABLET | Freq: Four times a day (QID) | ORAL | Status: DC

## 2015-06-30 MED ORDER — FLUTICASONE PROPIONATE 50 MCG/ACT NA SUSP
1.0000 | Freq: Every day | NASAL | Status: DC
  Filled 2015-06-30: qty 16

## 2015-06-30 MED ORDER — NITROGLYCERIN 0.4 MG SL SUBL: 0.4000 mg | SUBLINGUAL_TABLET | SUBLINGUAL | Status: DC | PRN

## 2015-06-30 MED ORDER — RAMIPRIL 10 MG PO CAPS
10.0000 mg | ORAL_CAPSULE | Freq: Two times a day (BID) | ORAL | Status: DC
  Administered 2015-06-30 – 2015-07-01 (×2): 10 mg via ORAL
  Filled 2015-06-30 (×2): qty 1

## 2015-06-30 MED ORDER — LORATADINE 10 MG PO TABS
10.0000 mg | ORAL_TABLET | Freq: Every day | ORAL | Status: DC
  Administered 2015-06-30 – 2015-07-01 (×2): 10 mg via ORAL
  Filled 2015-06-30 (×2): qty 1

## 2015-06-30 MED ORDER — METOPROLOL SUCCINATE ER 50 MG PO TB24: 50.0000 mg | ORAL_TABLET | Freq: Every day | ORAL | Status: DC

## 2015-06-30 MED ORDER — PANTOPRAZOLE SODIUM 40 MG PO TBEC
40.0000 mg | DELAYED_RELEASE_TABLET | Freq: Every day | ORAL | Status: DC
  Administered 2015-06-30 – 2015-07-01 (×2): 40 mg via ORAL
  Filled 2015-06-30 (×2): qty 1

## 2015-06-30 MED ORDER — TETANUS-DIPHTH-ACELL PERTUSSIS 5-2.5-18.5 LF-MCG/0.5 IM SUSP
INTRAMUSCULAR | Status: AC
  Filled 2015-06-30: qty 0.5

## 2015-06-30 MED ORDER — BUPROPION HCL ER (XL) 150 MG PO TB24
300.0000 mg | ORAL_TABLET | Freq: Every day | ORAL | Status: DC
  Administered 2015-06-30 – 2015-07-01 (×2): 300 mg via ORAL
  Filled 2015-06-30 (×2): qty 2

## 2015-06-30 NOTE — ED Notes (Signed)
Meal tray ordered 

## 2015-06-30 NOTE — ED Notes (Signed)
Patient taken to ct

## 2015-06-30 NOTE — ED Notes (Signed)
Pt reports blurred vision in the left eye.

## 2015-06-30 NOTE — ED Notes (Signed)
Pt sitting up in bed and using phone.
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
## 2015-06-30 NOTE — ED Notes (Signed)
Dr. Joya Salm paged and spoke with regarding admission orders. States that the floor will page him once pt arrives for orders

## 2015-06-30 NOTE — ED Notes (Signed)
Patient transported to CT 
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
## 2015-06-30 NOTE — ED Notes (Signed)
Phone returned to pt per request.

## 2015-06-30 NOTE — ED Notes (Signed)
Dr. Botero at bedside. 

## 2015-06-30 NOTE — Care Management Obs Status (Signed)
[place] NOTIFICATION   Patient Details  Name: EDVIN IDDINGS MRN: [id number] Date of Birth: 03/20/1950   Medicare Observation Status Notification Given:  Yes    Zenon Mayo, RN 06/30/2015, 4:14 PM

## 2015-07-01 DIAGNOSIS — S0240DA Maxillary fracture, left side, initial encounter for closed fracture: Secondary | ICD-10-CM | POA: Diagnosis not present

## 2015-07-01 NOTE — Progress Notes (Signed)
Dr. Arnoldo Morale notified about patient's elevated BP 170s/90s, which is a change from documented BPs on admit. No new orders received. Will continue to monitor.

## 2015-07-01 NOTE — Discharge Summary (Signed)
Physician Discharge Summary  Patient ID: Gregory Hansen MRN: [id number] DOB/AGE: [birth year] 65 y.o.  Admit date: 06/30/2015 Discharge date: 07/01/2015  Admission Diagnoses:Right frontal contusion, dramatic brain injury  Discharge Diagnoses: The same Active Problems:   Head injury   CHI (closed head injury)   Discharged Condition: good  Hospital Course: Dr. Joya Salm admitted the patient on 06/30/2015 with a diagnosis of a traumatic brain injury with right frontal contusion and a sinus fracture. The patient was observed in the ICU. His hospital course was unremarkable.  On 07/01/2015 the patient looked and felt well. He requested discharge to home. The patient was given written and oral discharge instructions. He was instructed to resume his aspirin and Plavix on Monday. He was instructed to follow-up with his primary doctor for hypertension. All his questions were answered.  Consults: ENT Significant Diagnostic Studies: Head CT Treatments: Observation Discharge Exam: Blood pressure 173/89, pulse 71, temperature 98.7 F (37.1 C), temperature source Oral, resp. rate 16, height 6\' 2"  (1.88 m), weight 106.595 kg (235 lb), SpO2 98 %. The patient is alert and oriented 3. He looks well. Speech is normal. His strength is normal.  Disposition: Home  Discharge Instructions    Call MD for:  difficulty breathing, headache or visual disturbances    Complete by:  As directed      Call MD for:  extreme fatigue    Complete by:  As directed      Call MD for:  hives    Complete by:  As directed      Call MD for:  persistant dizziness or light-headedness    Complete by:  As directed      Call MD for:  persistant nausea and vomiting    Complete by:  As directed      Call MD for:  redness, tenderness, or signs of infection (pain, swelling, redness, odor or green/yellow discharge around incision site)    Complete by:  As directed      Call MD for:  severe uncontrolled pain    Complete by:  As  directed      Call MD for:  temperature >100.4    Complete by:  As directed      Diet - low sodium heart healthy    Complete by:  As directed      Discharge instructions    Complete by:  As directed   Call [phone] for a followup appointment. Take a stool softener while you are using pain medications.     Driving Restrictions    Complete by:  As directed   Do not drive for 2 weeks.     Increase activity slowly    Complete by:  As directed      Lifting restrictions    Complete by:  As directed   Do not lift more than 5 pounds. No excessive bending or twisting.     May shower / Bathe    Complete by:  As directed   He may shower after the pain she is removed 3 days after surgery. Leave the incision alone.     No wound care    Complete by:  As directed             Medication List    TAKE these medications        aspirin 81 MG chewable tablet  Chew 81 mg by mouth daily.     atorvastatin 40 MG tablet  Commonly known as:  LIPITOR  Take  40 mg by mouth daily.     buPROPion 300 MG 24 hr tablet  Commonly known as:  WELLBUTRIN XL  Take 300 mg by mouth daily.     loratadine 10 MG tablet  Commonly known as:  CLARITIN  Take 10 mg by mouth daily.     metoprolol succinate 50 MG 24 hr tablet  Commonly known as:  TOPROL-XL  Take 50 mg by mouth daily. Take with or immediately following a meal.     multivitamin with minerals Tabs tablet  Take 1 tablet by mouth daily.     nefazodone 200 MG tablet  Commonly known as:  SERZONE  Take 300 mg by mouth at bedtime.     nitroGLYCERIN 0.4 MG SL tablet  Commonly known as:  NITROSTAT  Place 0.4 mg under the tongue every 5 (five) minutes as needed for chest pain.     pantoprazole 40 MG tablet  Commonly known as:  PROTONIX  Take 40 mg by mouth daily.     PROBIOTIC PO  Take 1 tablet by mouth daily.     ramipril 10 MG capsule  Commonly known as:  ALTACE  Take 10 mg by mouth 2 (two) times daily.          SignedNewman Pies D 07/01/2015, 9:15 AM
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
## 2015-07-01 NOTE — Progress Notes (Signed)
Discharge Note. Patient given discharge instructions and reviewed all medications.  Patient reports that he doesn't have any blurry vision and is feeling ready to go home. Pt has multiple items in locker with security and will be picking up all belonging with NT as he discharges.

## 2015-07-03 ENCOUNTER — Encounter: Payer: Self-pay | Admitting: Cardiovascular Disease

## 2015-08-31 ENCOUNTER — Other Ambulatory Visit (INDEPENDENT_AMBULATORY_CARE_PROVIDER_SITE_OTHER): Payer: Medicare Other | Admitting: *Deleted

## 2015-08-31 DIAGNOSIS — R748 Abnormal levels of other serum enzymes: Secondary | ICD-10-CM | POA: Diagnosis not present

## 2015-08-31 LAB — HEPATIC FUNCTION PANEL
ALK PHOS: 81 U/L (ref 40–115)
ALT: 55 U/L — AB (ref 9–46)
AST: 63 U/L — AB (ref 10–35)
Albumin: 3.7 g/dL (ref 3.6–5.1)
BILIRUBIN DIRECT: 0.2 mg/dL (ref <=0.2)
BILIRUBIN INDIRECT: 0.4 mg/dL (ref 0.2–1.2)
Total Bilirubin: 0.6 mg/dL (ref 0.2–1.2)
Total Protein: 7.1 g/dL (ref 6.1–8.1)

## 2015-08-31 NOTE — Addendum Note (Signed)
Addended by: Eulis Foster on: 08/31/2015 02:49 PM   Modules accepted: Orders

## 2016-01-27 ENCOUNTER — Other Ambulatory Visit: Payer: Self-pay | Admitting: Cardiovascular Disease

## 2016-03-07 ENCOUNTER — Other Ambulatory Visit: Payer: Self-pay | Admitting: Cardiovascular Disease

## 2016-04-10 ENCOUNTER — Other Ambulatory Visit: Payer: Self-pay | Admitting: Cardiovascular Disease

## 2016-04-12 ENCOUNTER — Encounter: Payer: Self-pay | Admitting: Cardiovascular Disease

## 2016-04-12 ENCOUNTER — Ambulatory Visit (INDEPENDENT_AMBULATORY_CARE_PROVIDER_SITE_OTHER): Payer: Medicare Other | Admitting: Cardiovascular Disease

## 2016-04-12 VITALS — BP 160/88 | HR 75 | Ht 74.0 in | Wt 256.8 lb

## 2016-04-12 DIAGNOSIS — I2510 Atherosclerotic heart disease of native coronary artery without angina pectoris: Secondary | ICD-10-CM

## 2016-04-12 DIAGNOSIS — I10 Essential (primary) hypertension: Secondary | ICD-10-CM | POA: Diagnosis not present

## 2016-04-12 DIAGNOSIS — E785 Hyperlipidemia, unspecified: Secondary | ICD-10-CM | POA: Diagnosis not present

## 2016-04-12 MED ORDER — SILDENAFIL CITRATE 20 MG PO TABS: ORAL_TABLET | ORAL | 1 refills | Status: DC

## 2016-04-12 MED ORDER — METOPROLOL SUCCINATE ER 100 MG PO TB24: 100.0000 mg | ORAL_TABLET | Freq: Every day | ORAL | 3 refills | Status: DC

## 2016-04-12 NOTE — Patient Instructions (Signed)
Medication Instructions:  Your physician has recommended you make the following change in your medication:  1. INCREASE Metoprolol Succinate to 100mg  take one tablet by mouth daily 2. Sildenafil 20mg  take 2-5 tablets by mouth as needed prior to sexual activity  Labwork: Your physician recommends that you have lab work: LIVER  Testing/Procedures: No new orders.   Follow-Up: Your physician wants you to follow-up in: 1 YEAR with Dr Burt Knack.  You will receive a reminder letter in the mail two months in advance. If you don't receive a letter, please call our office to schedule the follow-up appointment.   Any Other Special Instructions Will Be Listed Below (If Applicable).     If you need a refill on your cardiac medications before your next appointment, please call your pharmacy.

## 2016-04-12 NOTE — Progress Notes (Signed)
Cardiology Office Note Date:  04/12/2016   ID:  Gregory Hansen, DOB 04-15-1950, MRN [id number]  PCP:  No PCP Per Patient  Cardiologist:  [name], MD    Chief Complaint  Patient presents with  . Coronary Artery Disease   History of Present Illness: Gregory Hansen is a 66 y.o. male who presents for follow-up of coronary artery disease. He has undergone multiple PCI procedures in 2002. He's had chronic exertional angina. He presented in 2015 with progressive angina and underwent cardiac catheterization demonstrating severe ostial RCA stenosis and severe left main stenosis. The patient underwent four-vessel CABG with a LIMA to LAD, vein graft obtuse marginal, and sequential vein graft to the PDA and PLA branches of the RCA. He had an uncomplicated postoperative course.  He's here alone. Today, he denies symptoms of palpitations, chest pain, shortness of breath, orthopnea, PND, lower extremity edema, dizziness, or syncope. He's gained weight and hasn't been as active. Has been having some issues with his legs that limit his activity level.    Past Medical History:  Diagnosis Date  . Anginal pain ([place])   . Anxiety   . Arthritis    SPINE   . Arthritis   . Coronary artery disease    s/p multiple percutaneous interventions, PCI 202 for DMI and DES distal RCA and CFX-OM 2006  . Coronary artery disease   . Depression   . GERD (gastroesophageal reflux disease)   . Hyperlipidemia    mixed  . Hyperlipidemia   . Hypertension   . MI (myocardial infarction)   . Myocardial infarction   . Spondylitis, ankylosing ([place])     Past Surgical History:  Procedure Laterality Date  . CARDIAC CATHETERIZATION     stent RCA June3, 2002, Stent OM/PTCA circumflex August 13, 2000  . CORONARY ANGIOPLASTY WITH STENT PLACEMENT     first one in 2006; had another place 3 months ago (August 2013)  . CORONARY ANGIOPLASTY WITH STENT PLACEMENT  03/26/2013   RCA           DR Arielys Wandersee  . CORONARY ANGIOPLASTY  WITH STENT PLACEMENT    . CORONARY ARTERY BYPASS GRAFT N/A 10/29/2013   Procedure: CORONARY ARTERY BYPASS GRAFTING x 4 (LIMA-LAD, SVG-OM, SVG-PD-PL) ENDOSCOPIC VEIN HARVEST RIGHT THIGH;  Surgeon: Gaye Pollack, MD;  Location: [facility] OR;  Service: Open Heart Surgery;  Laterality: N/A;  . CORONARY ARTERY BYPASS GRAFT    . FINGER SURGERY     5  TH     DIGIT RIGHT HAND  . INTRAOPERATIVE TRANSESOPHAGEAL ECHOCARDIOGRAM N/A 10/29/2013   Procedure: INTRAOPERATIVE TRANSESOPHAGEAL ECHOCARDIOGRAM;  Surgeon: Gaye Pollack, MD;  Location: [facility] OR;  Service: Open Heart Surgery;  Laterality: N/A;  . LEFT HEART CATHETERIZATION WITH CORONARY ANGIOGRAM N/A 10/01/2011   Procedure: LEFT HEART CATHETERIZATION WITH CORONARY ANGIOGRAM;  Surgeon: [name], MD;  Location: [facility] CATH LAB;  Service: Cardiovascular;  Laterality: N/A;  . LEFT HEART CATHETERIZATION WITH CORONARY ANGIOGRAM N/A 03/26/2013   Procedure: LEFT HEART CATHETERIZATION WITH CORONARY ANGIOGRAM;  Surgeon: Blane Ohara, MD;  Location: [facility] CATH LAB;  Service: Cardiovascular;  Laterality: N/A;  . LEFT HEART CATHETERIZATION WITH CORONARY ANGIOGRAM N/A 10/25/2013   Procedure: LEFT HEART CATHETERIZATION WITH CORONARY ANGIOGRAM;  Surgeon: Blane Ohara, MD;  Location: [facility] CATH LAB;  Service: Cardiovascular;  Laterality: N/A;    Current Outpatient Prescriptions  Medication Sig Dispense Refill  . aspirin 81 MG tablet Take 81 mg by mouth daily.    Marland Kitchen  buPROPion (WELLBUTRIN XL) 300 MG 24 hr tablet Take 300 mg by mouth daily.    . Emollient (LUBRIDERM SERIOUSLY SENSITIVE) LOTN Apply 1 application topically daily. To upper body and face    . loratadine (CLARITIN) 10 MG tablet Take 10 mg by mouth daily.    . metoprolol succinate (TOPROL-XL) 100 MG 24 hr tablet Take 1 tablet (100 mg total) by mouth daily. Take with or immediately following a meal. 90 tablet 3  . Multiple Vitamin (MULTIVITAMIN WITH MINERALS) TABS tablet Take 1 tablet by mouth daily.    . nefazodone  (SERZONE) 200 MG tablet Take 300 mg by mouth at bedtime.    . nitroGLYCERIN (NITROSTAT) 0.4 MG SL tablet Place 0.4 mg under the tongue every 5 (five) minutes as needed for chest pain. Up to 3 doses    . Probiotic Product (PROBIOTIC PO) Take 1 tablet by mouth daily.    . ramipril (ALTACE) 10 MG capsule Take 10 mg by mouth 2 (two) times daily.    Marland Kitchen atorvastatin (LIPITOR) 80 MG tablet TAKE ONE-HALF TABLET BY  MOUTH DAILY 45 tablet 3  . pantoprazole (PROTONIX) 40 MG tablet TAKE 1 TABLET BY MOUTH  DAILY 90 tablet 3  . sildenafil (REVATIO) 20 MG tablet Take 2-5 tablets by mouth as needed prior to sexual activity 50 tablet 1   No current facility-administered medications for this visit.     Allergies:   Patient has no known allergies.   Social History:  The patient  reports that he quit smoking about 36 years ago. His smoking use included Cigarettes. He has a 20.00 pack-year smoking history. He has never used smokeless tobacco. He reports that he drinks about 13.2 oz of alcohol per week . He reports that he does not use drugs.   Family History:  The patient's  family history includes Breast cancer in his mother; Heart attack in his brother; Heart disease in his brother, father, and maternal grandfather; Leukemia in his maternal grandmother.    ROS:  Please see the history of present illness.  Otherwise, review of systems is positive for balance problems, leg pain.  All other systems are reviewed and negative.    PHYSICAL EXAM: VS:  BP (!) 160/88   Pulse 75   Ht 6\' 2"  (1.88 m)   Wt 256 lb 12.8 oz (116.5 kg)   BMI 32.97 kg/m  , BMI Body mass index is 32.97 kg/m. GEN: Well nourished, well developed, in no acute distress  HEENT: normal  Neck: no JVD, no masses. No carotid bruits Cardiac: RRR without murmur or gallop                Respiratory:  clear to auscultation bilaterally, normal work of breathing GI: soft, nontender, nondistended, + BS MS: no deformity or atrophy  Ext: no pretibial  edema, pedal pulses 2+= bilaterally Skin: warm and dry, no rash Neuro:  Strength and sensation are intact Psych: euthymic mood, full affect  EKG:  EKG is ordered today. The ekg ordered today shows NSR 75 bpm, incomplete RBBB  Recent Labs: 06/30/2015: BUN 12; BUN 15; Creatinine, Ser 0.90; Creatinine, Ser 1.20; Hemoglobin 13.8; Hemoglobin 16.0; Platelets 185; Potassium 4.4; Potassium 4.5; Sodium 134; Sodium 135 08/31/2015: ALT 55   Lipid Panel     Component Value Date/Time   CHOL 124 (L) 05/25/2015 0937   TRIG 143 05/25/2015 0937   HDL 44 05/25/2015 0937   CHOLHDL 2.8 05/25/2015 0937   VLDL 29 05/25/2015 [id number]  LDLCALC 51 05/25/2015 0937   LDLDIRECT 89.7 08/01/2011 0921      Wt Readings from Last 3 Encounters:  04/12/16 256 lb 12.8 oz (116.5 kg)  06/30/15 235 lb (106.6 kg)  02/20/15 252 lb 12.8 oz (114.7 kg)     ASSESSMENT AND PLAN: 1.  CAD, native vessel, without angina: continues to do well From a symptomatic perspective since his bypass surgery. His medicines are reviewed and will be continued. He is on a good regimen with an ACE inhibitor, beta blocker, aspirin, and a statin drug. Will increase his beta blocker (see discussion below). He really needs to refocus on diet and exercise and we had a lengthy discussion about this today.  2. Hypertension, uncontrolled: Increase metoprolol succinate 100 mg daily. Work on weight loss.  3. Hyperlipidemia: The patient is treated with atorvastatin 40 mg daily. Lifestyle modification discussed.  4. Erectile dysfunction: Prescription for sildenafil written.  Current medicines are reviewed with the patient today.  The patient does not have concerns regarding medicines.  Labs/ tests ordered today include:   Orders Placed This Encounter  Procedures  . Hepatic function panel  . EKG 12-Lead    Disposition:   FU one year  Signed, [name], MD  04/12/2016 5:31 PM    [facility] [place],  [place], [place]  [address] Phone: [phone]; Fax: [phone]

## 2016-04-13 LAB — HEPATIC FUNCTION PANEL
ALK PHOS: 80 IU/L (ref 39–117)
ALT: 40 IU/L (ref 0–44)
AST: 54 IU/L — ABNORMAL HIGH (ref 0–40)
Albumin: 4 g/dL (ref 3.6–4.8)
Bilirubin Total: 0.7 mg/dL (ref 0.0–1.2)
Bilirubin, Direct: 0.28 mg/dL (ref 0.00–0.40)
Total Protein: 7.4 g/dL (ref 6.0–8.5)

## 2016-05-05 ENCOUNTER — Other Ambulatory Visit: Payer: Self-pay | Admitting: Cardiovascular Disease

## 2016-05-05 DIAGNOSIS — E785 Hyperlipidemia, unspecified: Secondary | ICD-10-CM

## 2016-05-05 DIAGNOSIS — I10 Essential (primary) hypertension: Secondary | ICD-10-CM

## 2016-05-05 DIAGNOSIS — I2510 Atherosclerotic heart disease of native coronary artery without angina pectoris: Secondary | ICD-10-CM

## 2016-11-22 ENCOUNTER — Inpatient Hospital Stay (HOSPITAL_COMMUNITY)
Admission: EM | Admit: 2016-11-22 | Discharge: 2016-11-24 | DRG: 372 | Disposition: A | Discharge status: Home / Self Care | Payer: Medicare Other | Attending: Family Medicine | Admitting: Family Medicine

## 2016-11-22 ENCOUNTER — Emergency Department (HOSPITAL_COMMUNITY): Payer: Medicare Other

## 2016-11-22 ENCOUNTER — Encounter (HOSPITAL_COMMUNITY): Payer: Self-pay | Admitting: Emergency Medicine

## 2016-11-22 ENCOUNTER — Observation Stay (HOSPITAL_COMMUNITY): Payer: Medicare Other

## 2016-11-22 DIAGNOSIS — I10 Essential (primary) hypertension: Secondary | ICD-10-CM | POA: Diagnosis present

## 2016-11-22 DIAGNOSIS — I252 Old myocardial infarction: Secondary | ICD-10-CM

## 2016-11-22 DIAGNOSIS — E876 Hypokalemia: Secondary | ICD-10-CM | POA: Diagnosis present

## 2016-11-22 DIAGNOSIS — E860 Dehydration: Secondary | ICD-10-CM | POA: Diagnosis present

## 2016-11-22 DIAGNOSIS — Z951 Presence of aortocoronary bypass graft: Secondary | ICD-10-CM

## 2016-11-22 DIAGNOSIS — R739 Hyperglycemia, unspecified: Secondary | ICD-10-CM | POA: Diagnosis present

## 2016-11-22 DIAGNOSIS — K760 Fatty (change of) liver, not elsewhere classified: Secondary | ICD-10-CM | POA: Diagnosis present

## 2016-11-22 DIAGNOSIS — A072 Cryptosporidiosis: Secondary | ICD-10-CM | POA: Diagnosis not present

## 2016-11-22 DIAGNOSIS — Z79899 Other long term (current) drug therapy: Secondary | ICD-10-CM

## 2016-11-22 DIAGNOSIS — R197 Diarrhea, unspecified: Secondary | ICD-10-CM | POA: Diagnosis present

## 2016-11-22 DIAGNOSIS — Z955 Presence of coronary angioplasty implant and graft: Secondary | ICD-10-CM

## 2016-11-22 DIAGNOSIS — Z7902 Long term (current) use of antithrombotics/antiplatelets: Secondary | ICD-10-CM

## 2016-11-22 DIAGNOSIS — R809 Proteinuria, unspecified: Secondary | ICD-10-CM | POA: Diagnosis present

## 2016-11-22 DIAGNOSIS — K219 Gastro-esophageal reflux disease without esophagitis: Secondary | ICD-10-CM | POA: Diagnosis present

## 2016-11-22 DIAGNOSIS — M459 Ankylosing spondylitis of unspecified sites in spine: Secondary | ICD-10-CM | POA: Diagnosis present

## 2016-11-22 DIAGNOSIS — K529 Noninfective gastroenteritis and colitis, unspecified: Secondary | ICD-10-CM

## 2016-11-22 DIAGNOSIS — I2510 Atherosclerotic heart disease of native coronary artery without angina pectoris: Secondary | ICD-10-CM | POA: Diagnosis present

## 2016-11-22 DIAGNOSIS — I959 Hypotension, unspecified: Secondary | ICD-10-CM | POA: Diagnosis present

## 2016-11-22 DIAGNOSIS — D696 Thrombocytopenia, unspecified: Secondary | ICD-10-CM | POA: Diagnosis not present

## 2016-11-22 DIAGNOSIS — F329 Major depressive disorder, single episode, unspecified: Secondary | ICD-10-CM | POA: Diagnosis present

## 2016-11-22 DIAGNOSIS — E872 Acidosis: Secondary | ICD-10-CM | POA: Diagnosis present

## 2016-11-22 DIAGNOSIS — Z7982 Long term (current) use of aspirin: Secondary | ICD-10-CM

## 2016-11-22 DIAGNOSIS — R160 Hepatomegaly, not elsewhere classified: Secondary | ICD-10-CM | POA: Diagnosis present

## 2016-11-22 DIAGNOSIS — F419 Anxiety disorder, unspecified: Secondary | ICD-10-CM | POA: Diagnosis present

## 2016-11-22 DIAGNOSIS — N179 Acute kidney failure, unspecified: Secondary | ICD-10-CM | POA: Diagnosis present

## 2016-11-22 DIAGNOSIS — Z87891 Personal history of nicotine dependence: Secondary | ICD-10-CM

## 2016-11-22 DIAGNOSIS — E785 Hyperlipidemia, unspecified: Secondary | ICD-10-CM | POA: Diagnosis present

## 2016-11-22 DIAGNOSIS — Z23 Encounter for immunization: Secondary | ICD-10-CM

## 2016-11-22 LAB — CBC
HEMATOCRIT: 49 % (ref 39.0–52.0)
HEMOGLOBIN: 15.9 g/dL (ref 13.0–17.0)
MCH: 31.5 pg (ref 26.0–34.0)
MCHC: 32.4 g/dL (ref 30.0–36.0)
MCV: 97.2 fL (ref 78.0–100.0)
Platelets: 154 10*3/uL (ref 150–400)
RBC: 5.04 MIL/uL (ref 4.22–5.81)
RDW: 14.6 % (ref 11.5–15.5)
WBC: 7.3 10*3/uL (ref 4.0–10.5)

## 2016-11-22 LAB — I-STAT CG4 LACTIC ACID, ED
LACTIC ACID, VENOUS: 3.28 mmol/L — AB (ref 0.5–1.9)
Lactic Acid, Venous: 2.24 mmol/L (ref 0.5–1.9)

## 2016-11-22 LAB — COMPREHENSIVE METABOLIC PANEL
ALT: 38 U/L (ref 17–63)
ANION GAP: 12 (ref 5–15)
AST: 63 U/L — AB (ref 15–41)
Albumin: 3.7 g/dL (ref 3.5–5.0)
Alkaline Phosphatase: 65 U/L (ref 38–126)
BILIRUBIN TOTAL: 1.3 mg/dL — AB (ref 0.3–1.2)
BUN: 23 mg/dL — AB (ref 6–20)
CHLORIDE: 103 mmol/L (ref 101–111)
CO2: 16 mmol/L — ABNORMAL LOW (ref 22–32)
Calcium: 8.7 mg/dL — ABNORMAL LOW (ref 8.9–10.3)
Creatinine, Ser: 2.8 mg/dL — ABNORMAL HIGH (ref 0.61–1.24)
GFR, EST AFRICAN AMERICAN: 25 mL/min — AB (ref >60)
GFR, EST NON AFRICAN AMERICAN: 22 mL/min — AB (ref >60)
Glucose, Bld: 169 mg/dL — ABNORMAL HIGH (ref 65–99)
POTASSIUM: 3.9 mmol/L (ref 3.5–5.1)
Sodium: 131 mmol/L — ABNORMAL LOW (ref 135–145)
TOTAL PROTEIN: 9 g/dL — AB (ref 6.5–8.1)

## 2016-11-22 LAB — URINALYSIS, ROUTINE W REFLEX MICROSCOPIC
Glucose, UA: NEGATIVE mg/dL
HGB URINE DIPSTICK: NEGATIVE
KETONES UR: NEGATIVE mg/dL
LEUKOCYTES UA: NEGATIVE
Nitrite: NEGATIVE
PH: 5 (ref 5.0–8.0)
Protein, ur: 100 mg/dL — AB
Specific Gravity, Urine: 1.023 (ref 1.005–1.030)

## 2016-11-22 LAB — C DIFFICILE QUICK SCREEN W PCR REFLEX
C Diff antigen: NEGATIVE
C Diff interpretation: NOT DETECTED
C Diff toxin: NEGATIVE

## 2016-11-22 LAB — LACTIC ACID, PLASMA: LACTIC ACID, VENOUS: 1.2 mmol/L (ref 0.5–1.9)

## 2016-11-22 LAB — LIPASE, BLOOD: LIPASE: 28 U/L (ref 11–51)

## 2016-11-22 LAB — TROPONIN I

## 2016-11-22 MED ORDER — SODIUM CHLORIDE 0.9 % IV BOLUS (SEPSIS)
1000.0000 mL | Freq: Once | INTRAVENOUS | Status: AC
  Administered 2016-11-22: 1000 mL via INTRAVENOUS

## 2016-11-22 MED ORDER — SALINE SPRAY 0.65 % NA SOLN
1.0000 | NASAL | Status: DC | PRN
  Administered 2016-11-23: 1 via NASAL
  Filled 2016-11-22: qty 44

## 2016-11-22 MED ORDER — DEXTROSE-NACL 5-0.45 % IV SOLN
INTRAVENOUS | Status: DC
  Administered 2016-11-22 – 2016-11-24 (×4): via INTRAVENOUS

## 2016-11-22 MED ORDER — ONDANSETRON HCL 4 MG/2ML IJ SOLN
4.0000 mg | Freq: Once | INTRAMUSCULAR | Status: AC
  Administered 2016-11-22: 4 mg via INTRAVENOUS
  Filled 2016-11-22: qty 2

## 2016-11-22 MED ORDER — FLUTICASONE PROPIONATE 50 MCG/ACT NA SUSP
2.0000 | Freq: Every day | NASAL | Status: DC
  Administered 2016-11-23 – 2016-11-24 (×2): 2 via NASAL
  Filled 2016-11-22: qty 16

## 2016-11-22 MED ORDER — ATORVASTATIN CALCIUM 40 MG PO TABS
40.0000 mg | ORAL_TABLET | Freq: Every day | ORAL | Status: DC
  Administered 2016-11-23 – 2016-11-24 (×2): 40 mg via ORAL
  Filled 2016-11-22 (×2): qty 1

## 2016-11-22 MED ORDER — METRONIDAZOLE IN NACL 5-0.79 MG/ML-% IV SOLN
500.0000 mg | Freq: Once | INTRAVENOUS | Status: AC
  Administered 2016-11-22: 500 mg via INTRAVENOUS
  Filled 2016-11-22: qty 100

## 2016-11-22 MED ORDER — DEXTROSE-NACL 5-0.45 % IV SOLN: INTRAVENOUS | Status: DC

## 2016-11-22 MED ORDER — ENOXAPARIN SODIUM 30 MG/0.3ML ~~LOC~~ SOLN
30.0000 mg | Freq: Every day | SUBCUTANEOUS | Status: DC
  Administered 2016-11-23: 30 mg via SUBCUTANEOUS
  Filled 2016-11-22: qty 0.3

## 2016-11-22 MED ORDER — METOPROLOL TARTRATE 50 MG PO TABS
50.0000 mg | ORAL_TABLET | Freq: Two times a day (BID) | ORAL | Status: DC
  Administered 2016-11-22 – 2016-11-24 (×4): 50 mg via ORAL
  Filled 2016-11-22 (×4): qty 1

## 2016-11-22 MED ORDER — NEFAZODONE HCL 150 MG PO TABS
300.0000 mg | ORAL_TABLET | Freq: Every day | ORAL | Status: DC
  Administered 2016-11-23 – 2016-11-24 (×2): 300 mg via ORAL
  Filled 2016-11-22 (×2): qty 2

## 2016-11-22 MED ORDER — BUPROPION HCL ER (XL) 150 MG PO TB24
300.0000 mg | ORAL_TABLET | Freq: Every day | ORAL | Status: DC
  Administered 2016-11-23 – 2016-11-24 (×2): 300 mg via ORAL
  Filled 2016-11-22 (×2): qty 2

## 2016-11-22 MED ORDER — CIPROFLOXACIN IN D5W 400 MG/200ML IV SOLN
400.0000 mg | Freq: Once | INTRAVENOUS | Status: AC
  Administered 2016-11-22: 400 mg via INTRAVENOUS
  Filled 2016-11-22: qty 200

## 2016-11-22 MED ORDER — PANTOPRAZOLE SODIUM 40 MG PO TBEC
40.0000 mg | DELAYED_RELEASE_TABLET | Freq: Every day | ORAL | Status: DC
  Administered 2016-11-23 – 2016-11-24 (×2): 40 mg via ORAL
  Filled 2016-11-22 (×2): qty 1

## 2016-11-22 MED ORDER — INFLUENZA VAC SPLIT HIGH-DOSE 0.5 ML IM SUSY
0.5000 mL | PREFILLED_SYRINGE | INTRAMUSCULAR | Status: AC
  Administered 2016-11-23: 0.5 mL via INTRAMUSCULAR
  Filled 2016-11-22: qty 0.5

## 2016-11-22 MED ORDER — ASPIRIN EC 81 MG PO TBEC
81.0000 mg | DELAYED_RELEASE_TABLET | Freq: Every day | ORAL | Status: DC
  Administered 2016-11-23 – 2016-11-24 (×2): 81 mg via ORAL
  Filled 2016-11-22 (×2): qty 1

## 2016-11-22 NOTE — ED Provider Notes (Signed)
[facility] DEPT Provider Note   CSN: [id number] Arrival date & time: 11/22/16  1319     History   Chief Complaint Chief Complaint  Patient presents with  . Abdominal Pain  . blockage    HPI Gregory Hansen is a 66 y.o. male.  Pt presents to the ED today with n/v/d and a possible sbo.  The pt has had diarrhea and vomiting for the past few days.  He went to urgent care who did a KUB x-ray.  It showed a possible sbo.  The pt said that he does not have any pain, but still nausea.      Past Medical History:  Diagnosis Date  . Anginal pain ([place])   . Anxiety   . Arthritis    SPINE   . Arthritis   . Coronary artery disease    s/p multiple percutaneous interventions, PCI 202 for DMI and DES distal RCA and CFX-OM 2006  . Coronary artery disease   . Depression   . GERD (gastroesophageal reflux disease)   . Hyperlipidemia    mixed  . Hyperlipidemia   . Hypertension   . MI (myocardial infarction) ([place])   . Myocardial infarction ([place])   . Spondylitis, ankylosing [facility])     Patient Active Problem List   Diagnosis Date Noted  . Diarrhea 11/22/2016  . Head injury 06/30/2015  . CHI (closed head injury) 06/30/2015  . S/P CABG x 4 10/29/2013  . HYPERLIPIDEMIA-MIXED 06/15/2008  . DEPRESSION 06/15/2008  . HYPERTENSION, BENIGN 06/15/2008  . CAD (coronary artery disease) 06/15/2008  . GERD 06/15/2008  . SPONDYLITIS, ANKYLOSING 06/15/2008    Past Surgical History:  Procedure Laterality Date  . CARDIAC CATHETERIZATION     stent RCA June3, 2002, Stent OM/PTCA circumflex August 13, 2000  . CORONARY ANGIOPLASTY WITH STENT PLACEMENT     first one in 2006; had another place 3 months ago (August 2013)  . CORONARY ANGIOPLASTY WITH STENT PLACEMENT  03/26/2013   RCA           DR COOPER  . CORONARY ANGIOPLASTY WITH STENT PLACEMENT    . CORONARY ARTERY BYPASS GRAFT N/A 10/29/2013   Procedure: CORONARY ARTERY BYPASS GRAFTING x 4 (LIMA-LAD, SVG-OM, SVG-PD-PL) ENDOSCOPIC VEIN HARVEST  RIGHT THIGH;  Surgeon: Gaye Pollack, MD;  Location: [facility] OR;  Service: Open Heart Surgery;  Laterality: N/A;  . CORONARY ARTERY BYPASS GRAFT    . FINGER SURGERY     5  TH     DIGIT RIGHT HAND  . INTRAOPERATIVE TRANSESOPHAGEAL ECHOCARDIOGRAM N/A 10/29/2013   Procedure: INTRAOPERATIVE TRANSESOPHAGEAL ECHOCARDIOGRAM;  Surgeon: Gaye Pollack, MD;  Location: [facility] OR;  Service: Open Heart Surgery;  Laterality: N/A;  . LEFT HEART CATHETERIZATION WITH CORONARY ANGIOGRAM N/A 10/01/2011   Procedure: LEFT HEART CATHETERIZATION WITH CORONARY ANGIOGRAM;  Surgeon: [name], MD;  Location: [facility] CATH LAB;  Service: Cardiovascular;  Laterality: N/A;  . LEFT HEART CATHETERIZATION WITH CORONARY ANGIOGRAM N/A 03/26/2013   Procedure: LEFT HEART CATHETERIZATION WITH CORONARY ANGIOGRAM;  Surgeon: Blane Ohara, MD;  Location: [facility] CATH LAB;  Service: Cardiovascular;  Laterality: N/A;  . LEFT HEART CATHETERIZATION WITH CORONARY ANGIOGRAM N/A 10/25/2013   Procedure: LEFT HEART CATHETERIZATION WITH CORONARY ANGIOGRAM;  Surgeon: Blane Ohara, MD;  Location: [facility] CATH LAB;  Service: Cardiovascular;  Laterality: N/A;       Home Medications    Prior to Admission medications   Medication Sig Start Date End Date Taking? Authorizing Provider  aspirin 81 MG  tablet Take 81 mg by mouth daily.   Yes [provider]  atorvastatin (LIPITOR) 80 MG tablet TAKE ONE-HALF TABLET BY  MOUTH DAILY Patient taking differently: TAKE 40MG  BY MOUTH ONCE  DAILY 04/12/16  Yes [name], MD  buPROPion (WELLBUTRIN XL) 300 MG 24 hr tablet Take 300 mg by mouth daily.   Yes [provider]  cetirizine (KLS ALLER-TEC) 10 MG tablet Take 10 mg by mouth daily.   Yes [provider]  clopidogrel (PLAVIX) 75 MG tablet Take 75 mg by mouth daily.   Yes [provider]  desonide (DESOWEN) 0.05 % cream Apply 1 application topically daily as needed (to face).   Yes [provider]  fluticasone (FLONASE) 50  MCG/ACT nasal spray Place 2 sprays into both nostrils daily.   Yes [provider]  metoprolol succinate (TOPROL-XL) 100 MG 24 hr tablet Take 1 tablet (100 mg total) by mouth daily. Take with or immediately following a meal. 04/12/16  Yes [name], MD  Multiple Vitamin (MULTIVITAMIN WITH MINERALS) TABS tablet Take 1 tablet by mouth daily.   Yes [provider]  nefazodone (SERZONE) 200 MG tablet Take 300 mg by mouth daily.    Yes [provider]  nitroGLYCERIN (NITROSTAT) 0.4 MG SL tablet Place 0.4 mg under the tongue every 5 (five) minutes as needed for chest pain. Up to 3 doses   Yes [provider]  pantoprazole (PROTONIX) 40 MG tablet TAKE 1 TABLET BY MOUTH  DAILY 04/12/16  Yes [name], MD  prednisoLONE acetate (PRED FORTE) 1 % ophthalmic suspension Place 1 drop into both eyes daily as needed.   Yes [provider]  Probiotic Product (PROBIOTIC PO) Take 1 tablet by mouth daily.   Yes [provider]  ramipril (ALTACE) 10 MG capsule TAKE 1 CAPSULE BY MOUTH TWO TIMES DAILY 05/06/16  Yes [name], MD  vitamin B-12 (CYANOCOBALAMIN) 1000 MCG tablet Take 1,000 mcg by mouth daily.   Yes [provider]  sildenafil (REVATIO) 20 MG tablet Take 2-5 tablets by mouth as needed prior to sexual activity Patient not taking: Reported on 11/22/2016 04/12/16   [name], MD    Family History Family History  Problem Relation Age of Onset  . Breast cancer Mother   . Heart disease Father   . Heart disease Brother   . Leukemia Maternal Grandmother   . Heart disease Maternal Grandfather   . Heart disease Unknown        positive for cardiac disease and both brothers have had cardiac events  . Heart attack Brother     Social History Social History  Substance Use Topics  . Smoking status: Former Smoker    Packs/day: 2.00    Years: 10.00    Types: Cigarettes    Quit date: 06/30/1979  . Smokeless tobacco: Never Used  .  Alcohol use 13.2 oz/week    14 Glasses of wine, 2 Shots of liquor per week     Comment: glass of wine at dinner everyday.     Allergies   Patient has no known allergies.   Review of Systems Review of Systems  Gastrointestinal: Positive for abdominal pain, diarrhea, nausea and vomiting.  All other systems reviewed and are negative.    Physical Exam Updated Vital Signs BP (!) 121/58 (BP Location: Right Arm)   Pulse 66   Temp 98.5 F (36.9 C) (Oral)   Resp 18   Ht 6\' 2"  (1.88 m)   Wt 110.6  kg (243 lb 13.3 oz)   SpO2 98%   BMI 31.31 kg/m   Physical Exam  Constitutional: He is oriented to person, place, and time. He appears well-developed and well-nourished.  HENT:  Head: Normocephalic and atraumatic.  Right Ear: External ear normal.  Left Ear: External ear normal.  Nose: Nose normal.  Mouth/Throat: Mucous membranes are dry.  Eyes: Pupils are equal, round, and reactive to light. Conjunctivae and EOM are normal.  Neck: Normal range of motion. Neck supple.  Cardiovascular: Normal rate, regular rhythm, normal heart sounds and intact distal pulses.   Pulmonary/Chest: Effort normal and breath sounds normal.  Abdominal: Soft. Bowel sounds are normal.  Musculoskeletal: Normal range of motion.  Neurological: He is alert and oriented to person, place, and time.  Skin: Skin is warm.  Psychiatric: He has a normal mood and affect. His behavior is normal. Judgment and thought content normal.  Nursing note and vitals reviewed.    ED Treatments / Results  Labs (all labs ordered are listed, but only abnormal results are displayed) Labs Reviewed  COMPREHENSIVE METABOLIC PANEL - Abnormal; Notable for the following:       Result Value   Sodium 131 (*)    CO2 16 (*)    Glucose, Bld 169 (*)    BUN 23 (*)    Creatinine, Ser 2.80 (*)    Calcium 8.7 (*)    Total Protein 9.0 (*)    AST 63 (*)    Total Bilirubin 1.3 (*)    GFR calc non Af Amer 22 (*)    GFR calc Af Amer 25 (*)     All other components within normal limits  URINALYSIS, ROUTINE W REFLEX MICROSCOPIC - Abnormal; Notable for the following:    Color, Urine AMBER (*)    APPearance CLOUDY (*)    Bilirubin Urine SMALL (*)    Protein, ur 100 (*)    Bacteria, UA RARE (*)    Squamous Epithelial / LPF 0-5 (*)    All other components within normal limits  LACTIC ACID, PLASMA - Abnormal; Notable for the following:    Lactic Acid, Venous 2.1 (*)    All other components within normal limits  CBC - Abnormal; Notable for the following:    Platelets 121 (*)    All other components within normal limits  CREATININE, SERUM - Abnormal; Notable for the following:    Creatinine, Ser 2.27 (*)    GFR calc non Af Amer 28 (*)    GFR calc Af Amer 33 (*)    All other components within normal limits  COMPREHENSIVE METABOLIC PANEL - Abnormal; Notable for the following:    Sodium 133 (*)    Potassium 2.7 (*)    CO2 17 (*)    BUN 27 (*)    Creatinine, Ser 2.30 (*)    Calcium 7.5 (*)    Albumin 3.3 (*)    AST 55 (*)    GFR calc non Af Amer 28 (*)    GFR calc Af Amer 32 (*)    All other components within normal limits  CBC - Abnormal; Notable for the following:    Platelets 123 (*)    All other components within normal limits  I-STAT CG4 LACTIC ACID, ED - Abnormal; Notable for the following:    Lactic Acid, Venous 3.28 (*)    All other components within normal limits  I-STAT CG4 LACTIC ACID, ED - Abnormal; Notable for the following:    Lactic  Acid, Venous 2.24 (*)    All other components within normal limits  C DIFFICILE QUICK SCREEN W PCR REFLEX  MRSA PCR SCREENING  GASTROINTESTINAL PANEL BY PCR, STOOL (REPLACES STOOL CULTURE)  LIPASE, BLOOD  CBC  LACTIC ACID, PLASMA  TROPONIN I  HEMOGLOBIN A1C  COMPREHENSIVE METABOLIC PANEL  MAGNESIUM    EKG  EKG Interpretation None       Radiology Ct Abdomen Pelvis Wo Contrast  Result Date: 11/22/2016 CLINICAL DATA:  Diarrhea and emesis for 3 days. Possible small  bowel obstruction on radiograph. EXAM: CT ABDOMEN AND PELVIS WITHOUT CONTRAST TECHNIQUE: Multidetector CT imaging of the abdomen and pelvis was performed following the standard protocol without IV contrast. COMPARISON:  None. FINDINGS: Lower chest: Clear lung bases. Normal heart size wiThout pericardial or pleural effusion. Prior median sternotomy. Hepatobiliary: Hepatic steatosis and hepatomegaly at 21.7 cm craniocaudal. Gallbladder distention, without specific evidence of acute cholecystitis. No biliary duct dilatation. Pancreas: Normal, without mass or ductal dilatation. Spleen: Normal in size, without focal abnormality. Adrenals/Urinary Tract: Normal adrenal glands. An upper pole left renal lesion measures 1.3 cm and 18 HU, favoring a cyst or minimally complex cyst. No renal calculi or hydronephrosis. No hydroureter or ureteric calculi. No bladder calculi. Stomach/Bowel: Normal stomach, without wall thickening. Fluid-filled colon, consistent with a diarrheal state. Scattered colonic diverticula. Normal terminal ileum. Appendix not visualized. Normal small bowel. Vascular/Lymphatic: Aortic and branch vessel atherosclerosis. No abdominopelvic adenopathy. Reproductive: Normal prostate. Other: No significant free fluid. Musculoskeletal: Fusion of the bilateral sacroiliac joints consistent with the clinical history of ankylosing spondylitis. Lower thoracic spine changes are also consistent with ankylosing spondylitis. IMPRESSION: 1.  No acute process in the abdomen or pelvis. 2. Nonspecific gallbladder distension. 3. Hepatic steatosis and hepatomegaly. 4.  Aortic Atherosclerosis (ICD10-I70.0). 5. Fluid-filled colon, consistent with a diarrheal state. 6. Ankylosing spondylitis. Electronically Signed   By: Abigail Miyamoto M.D.   On: 11/22/2016 17:47   Dg Abd 1 View  Result Date: 11/22/2016 CLINICAL DATA:  Abdominal pain and diarrhea for 3 days EXAM: ABDOMEN - 1 VIEW COMPARISON:  None. FINDINGS: The bowel gas pattern  is normal. No radio-opaque calculi or other significant radiographic abnormality are seen. IMPRESSION: Negative. Electronically Signed   By: Kerby Moors M.D.   On: 11/22/2016 21:33    Procedures Procedures (including critical care time)  Medications Ordered in ED Medications  fluticasone (FLONASE) 50 MCG/ACT nasal spray 2 spray (not administered)  metoprolol tartrate (LOPRESSOR) tablet 50 mg (50 mg Oral Given 11/22/16 2326)  atorvastatin (LIPITOR) tablet 40 mg (not administered)  pantoprazole (PROTONIX) EC tablet 40 mg (not administered)  buPROPion (WELLBUTRIN XL) 24 hr tablet 300 mg (not administered)  nefazodone (SERZONE) tablet 300 mg (not administered)  aspirin EC tablet 81 mg (not administered)  enoxaparin (LOVENOX) injection 30 mg (not administered)  dextrose 5 %-0.45 % sodium chloride infusion ( Intravenous New Bag/Given 11/23/16 0533)  Influenza vac split quadrivalent PF (FLUZONE HIGH-DOSE) injection 0.5 mL (not administered)  sodium chloride (OCEAN) 0.65 % nasal spray 1 spray (1 spray Each Nare Given 11/23/16 0045)  clopidogrel (PLAVIX) tablet 75 mg (not administered)  sodium chloride 0.9 % bolus 1,000 mL (0 mLs Intravenous Stopped 11/22/16 1525)  ondansetron (ZOFRAN) injection 4 mg (4 mg Intravenous Given 11/22/16 1430)  sodium chloride 0.9 % bolus 1,000 mL (0 mLs Intravenous Stopped 11/22/16 1618)  sodium chloride 0.9 % bolus 1,000 mL (0 mLs Intravenous Stopped 11/22/16 1808)  metroNIDAZOLE (FLAGYL) IVPB 500 mg (0 mg Intravenous Stopped 11/22/16 1811)  ciprofloxacin (CIPRO) IVPB 400 mg (0 mg Intravenous Stopped 11/22/16 1811)  potassium chloride 10 mEq in 100 mL IVPB (10 mEq Intravenous New Bag/Given 11/23/16 4585)     Initial Impression / Assessment and Plan / ED Course  I have reviewed the triage vital signs and the nursing notes.  Pertinent labs & imaging results that were available during my care of the patient were reviewed by me and considered in my medical decision making  (see chart for details).  Pt is feeling better after IVFs.  Due to AKI and severe diarrhea, will treat for colitis and admit for observation.    Pt's lactic acidosis has improved with IVFs.  The pt d/w unassigned for admission.   Final Clinical Impressions(s) / ED Diagnoses   Final diagnoses:  AKI (acute kidney injury) ([place])  Diarrhea, unspecified type  Colitis  Dehydration  Diarrhea    New Prescriptions Current Discharge Medication List       Isla Pence, MD 11/23/16 [phone]

## 2016-11-22 NOTE — Progress Notes (Signed)
Attempted to receive report from ED; left name and number, ED RN will call this RN back.

## 2016-11-22 NOTE — H&P (Signed)
[facility] Admission History and Physical Service Pager: [phone]  Patient name: Gregory Hansen Medical record number: [id number] Date of birth: July 06, 1950 Age: 66 y.o. Gender: male  Primary Care Provider: Patient, No Pcp Per Consultants: none Code Status: full (obtained on admission)  Chief Complaint: Diarrhea  Assessment and Plan: KWAN SHELLHAMMER is a 66 y.o. male presenting with diarrhea and emesis . PMH is significant for HTN, CAD status post stents x2 and quadruple CABG, depression, anxiety, GERD and ankylosing spondylitis  Acute diarrhea/emesis: watery, nonbloody, afebrile, benign exam and mild disease suggests viral illness (viral gastroenteritis). He has myalgia which concerns me for early flu although he doesn't have URI symptoms except very mild sore throat. Clostridium difficile has been ruled out by PCR in ED. Standing KUB and CT abdomen negative for SBO. Concerned about colitis in ED but abdominal exam without tenderness, and diarrhea is without blood. I also doubt bacterial infection without significant abdominal pain, tenderness or blood in stool. Parasitic diarrhea is also very painful. The acute nature and lack of constitutional symptoms doesn't suggest IBD or malignancy. However, he says he never had colonoscopy. Food contamination is another possibility but patient denies new food. He also denies new medication.  Overall, patient perked up with IV boluses in ED. He is hemodynamically stable. Lactic acidosis which could be due to dehydration improved significantly. He is overall well appearing.  - We will admit to [facility] for observation. Attending Dr. Mingo Amber - NS1/2D at 1 25 mL/h. Status post 3 L of normal saline in ED.  - Hold antibiotic. His presentation not convincing for bacterial or parasitic infection - Follow-up GI panel - If no improvement in his diarrhea, we will check fecal lactoferrin, fecal fat and chemistry - Consider Lomotil if  diarrhea persists and infectious etiologies ruled out - Trend lactic acid - CMP in a.m. - Recheck CBC in a.m. - Needs colonoscopy as an outpatient  AKI: likely prerenal due to dehydration from diarrhea and emesis. No LUTS although he reports history of elevated PSA 7 years ago. He said biopsy was negative at that time.  sCr 2.80 on admit. Baseline about 0.9.  -Hold ramipril and as a nephrotoxic medications -Rehydrated as above -Recheck BMP in the morning  Metabolic acidosis/Lactic acidosis: Lactic acid 3.28 on arrival. Down to 2.24 after IV bolus. Acidosis likely due to bicarbonate loss in diarrhea and dehydration. He appears well hydrated now. -Trend lactic acid - MIVF as above  Hyperglycemia: BMP glucose 169. No history of diabetes. -Check A1c -CBG every 12 hours  History of CAD s/p stent x2 in 2002 & 2006 and quadruple CABG in 2015. Patient reports mild exertional dyspnea and heaviness in his legs bilaterally. This could be just due to dehydration. He denies chest pain. Cardiopulmonary exam within normal limits. He has no signs of fluid overload. Patient is on metoprolol succinate 100 mg, atorvastatin 40 mg and aspirin 81 mg at home. Last dose on his medications was yesterday. He says he hasn't used his nitroglycerin in years. He is also on Viagra but is aware of the danger with nitroglycerin.  -Cardiac monitor for 24 hr -Check EKG and troponin 1 to rule out ACS -Continue metoprolol 50 mg twice a day. He is hemodynamically stable -Continue home aspirin, Plavix and atorvastatin. Not sure about the need for Plavix at this time but will defer this to his PCP/cardiologist  Hypertension: hypotensive to 93/55 in ED. Hypotension resolved after bolus IV fluid. He is now normotensive.  He is on metoprolol succinate 100 mg and ramipril 10 mg daily. Last dose was yesterday morning -Metoprolol 50 mg twice a day -hold ramipril in the setting of AKI  Hepatic steatosis/hepatomegaly on CT: patient is  a former heavy drinker. He has very mild AST elevation which is nonspecific. ALT is normal.  -Outpatient follow-up  Proteinuria: UA with urine protein to 100. -Recommend repeating UA as an outpatient  Depression/anxiety: Stable. Denies suicidal or homicidal ideation -Continue home Wellbutrin and nefazodone  FEN/GI: -Advance diet as tolerated. Heart healthy -Continue home Protonix.   Prophylaxis:  -Lovenox. At adjusted for renal function  Disposition:   History of Present Illness:  Gregory Hansen is a 66 y.o. male presenting with diarrhea and emesis Patient provided history. Patient reports having diarrhea for the last 3 days. He described the diarrhea as watery without blood or dark stool. He reports going to the bathroom 5-7 times a day. He says he went to the bathroom 4 times in this morning and 3 times since he came to emergency department. He also reports emesis for the last 3 days. He reports 2 episodes of emesis today. The emesis has improved. Emesis is food content without blood or bile. He denies nausea but states he felt queasy once. He reports fever and states his Tmax was 100 yesterday. He didn't have further fever. He reports mild cramping abdominal pain. Denies dysuria, hematuria or increased frequency of urination. He denies new medication or food. He denies URI symptoms but reports a little bit of sore throat. He also reports myalgia for the last 2 days. He hasn't had his flu shot yet. Denies recent travel.  Patient went to PCP today. He states they did an x-ray of his chest and his abdomen and there was a concern about partial SBO and they advised him to go to emergency department.  Off note, he also reports dyspnea with exertion and heaviness in his legs bilaterally. He denies chest pain.  Patient had no colonoscopy.  Patient is not a smoker. He reports drinking alcohol socially. Last drink about 4 days ago. He denies drug.  ED course: Vital signs significant for  tachycardia to 101 on arrival to ED. BP 97/67 on arrival and then dropped to 93/55. CBC within normal limits. CMP remarkable for sodium to 131, creatinine of 2.8, bicarbonate 16.  Lactic acidosis 3.28. UA with rare bacteria but no nitrites, leukocytes or ketones. C. difficile negative. GI panel obtained. CT abdomen with nonspecific gallbladder distention, hepatic steatosis and hepatomegaly, fluid-filled colon consistent with diarrheal state otherwise no acute finding. Standing KUB negative for SBO.  He was given 3 boluses with significant improvement in his blood pressure,  heart rate and lactic acidosis from 3.28-2.24. He was started on Cipro and Flagyl out of concern for colitis.  Family medicine was contacted to admit patient for further management of his diarrhea, AKI and questionable colitis  Review Of Systems:  Review of Systems  Constitutional: Positive for fever. Negative for diaphoresis and weight loss.       Tmax 100  HENT: Positive for sore throat.   Eyes: Negative for blurred vision.  Respiratory: Positive for shortness of breath. Negative for cough.        With exertion  Cardiovascular: Negative for chest pain, palpitations, orthopnea, claudication, leg swelling and PND.  Gastrointestinal: Positive for abdominal pain, diarrhea and vomiting. Negative for blood in stool, melena and nausea.       Cramping  Genitourinary: Negative for dysuria, frequency,  hematuria and urgency.  Musculoskeletal: Positive for myalgias.  Skin: Negative for rash.  Neurological: Positive for weakness. Negative for dizziness and sensory change.  Psychiatric/Behavioral: Positive for depression. Negative for memory loss, substance abuse and suicidal ideas. The patient is not nervous/anxious.     Patient Active Problem List   Diagnosis Date Noted  . Diarrhea 11/22/2016  . Head injury 06/30/2015  . CHI (closed head injury) 06/30/2015  . S/P CABG x 4 10/29/2013  . HYPERLIPIDEMIA-MIXED 06/15/2008  .  DEPRESSION 06/15/2008  . HYPERTENSION, BENIGN 06/15/2008  . CAD (coronary artery disease) 06/15/2008  . GERD 06/15/2008  . SPONDYLITIS, ANKYLOSING 06/15/2008    Past Medical History: Past Medical History:  Diagnosis Date  . Anginal pain ([place])   . Anxiety   . Arthritis    SPINE   . Arthritis   . Coronary artery disease    s/p multiple percutaneous interventions, PCI 202 for DMI and DES distal RCA and CFX-OM 2006  . Coronary artery disease   . Depression   . GERD (gastroesophageal reflux disease)   . Hyperlipidemia    mixed  . Hyperlipidemia   . Hypertension   . MI (myocardial infarction) ([place])   . Myocardial infarction ([place])   . Spondylitis, ankylosing ([place])     Past Surgical History: Past Surgical History:  Procedure Laterality Date  . CARDIAC CATHETERIZATION     stent RCA June3, 2002, Stent OM/PTCA circumflex August 13, 2000  . CORONARY ANGIOPLASTY WITH STENT PLACEMENT     first one in 2006; had another place 3 months ago (August 2013)  . CORONARY ANGIOPLASTY WITH STENT PLACEMENT  03/26/2013   RCA           DR COOPER  . CORONARY ANGIOPLASTY WITH STENT PLACEMENT    . CORONARY ARTERY BYPASS GRAFT N/A 10/29/2013   Procedure: CORONARY ARTERY BYPASS GRAFTING x 4 (LIMA-LAD, SVG-OM, SVG-PD-PL) ENDOSCOPIC VEIN HARVEST RIGHT THIGH;  Surgeon: Gaye Pollack, MD;  Location: [facility] OR;  Service: Open Heart Surgery;  Laterality: N/A;  . CORONARY ARTERY BYPASS GRAFT    . FINGER SURGERY     5  TH     DIGIT RIGHT HAND  . INTRAOPERATIVE TRANSESOPHAGEAL ECHOCARDIOGRAM N/A 10/29/2013   Procedure: INTRAOPERATIVE TRANSESOPHAGEAL ECHOCARDIOGRAM;  Surgeon: Gaye Pollack, MD;  Location: [facility] OR;  Service: Open Heart Surgery;  Laterality: N/A;  . LEFT HEART CATHETERIZATION WITH CORONARY ANGIOGRAM N/A 10/01/2011   Procedure: LEFT HEART CATHETERIZATION WITH CORONARY ANGIOGRAM;  Surgeon: [name], MD;  Location: [facility] CATH LAB;  Service: Cardiovascular;  Laterality: N/A;  . LEFT HEART CATHETERIZATION  WITH CORONARY ANGIOGRAM N/A 03/26/2013   Procedure: LEFT HEART CATHETERIZATION WITH CORONARY ANGIOGRAM;  Surgeon: Blane Ohara, MD;  Location: [facility] CATH LAB;  Service: Cardiovascular;  Laterality: N/A;  . LEFT HEART CATHETERIZATION WITH CORONARY ANGIOGRAM N/A 10/25/2013   Procedure: LEFT HEART CATHETERIZATION WITH CORONARY ANGIOGRAM;  Surgeon: Blane Ohara, MD;  Location: [facility] C[facility] LAB;  Service: Cardiovascular;  Laterality: N/A;    Social History: Social History  Substance Use Topics  . Smoking status: Former Smoker    Packs/day: 2.00    Years: 10.00    Types: Cigarettes    Quit date: 06/30/1979  . Smokeless tobacco: Never Used  . Alcohol use 13.2 oz/week    14 Glasses of wine, 2 Shots of liquor per week     Comment: glass of wine at dinner everyday.   Additional social history: See history of present illness Please also refer  to relevant sections of EMR.  Family History: Family History  Problem Relation Age of Onset  . Breast cancer Mother   . Heart disease Father   . Heart disease Brother   . Leukemia Maternal Grandmother   . Heart disease Maternal Grandfather   . Heart disease Unknown        positive for cardiac disease and both brothers have had cardiac events  . Heart attack Brother     Allergies and Medications: No Known Allergies No current facility-administered medications on file prior to encounter.    Current Outpatient Prescriptions on File Prior to Encounter  Medication Sig Dispense Refill  . aspirin 81 MG tablet Take 81 mg by mouth daily.    Marland Kitchen atorvastatin (LIPITOR) 80 MG tablet TAKE ONE-HALF TABLET BY  MOUTH DAILY (Patient taking differently: TAKE 40MG  BY MOUTH ONCE  DAILY) 45 tablet 3  . buPROPion (WELLBUTRIN XL) 300 MG 24 hr tablet Take 300 mg by mouth daily.    . metoprolol succinate (TOPROL-XL) 100 MG 24 hr tablet Take 1 tablet (100 mg total) by mouth daily. Take with or immediately following a meal. 90 tablet 3  . Multiple Vitamin (MULTIVITAMIN WITH  MINERALS) TABS tablet Take 1 tablet by mouth daily.    . nefazodone (SERZONE) 200 MG tablet Take 300 mg by mouth daily.     . nitroGLYCERIN (NITROSTAT) 0.4 MG SL tablet Place 0.4 mg under the tongue every 5 (five) minutes as needed for chest pain. Up to 3 doses    . pantoprazole (PROTONIX) 40 MG tablet TAKE 1 TABLET BY MOUTH  DAILY 90 tablet 3  . Probiotic Product (PROBIOTIC PO) Take 1 tablet by mouth daily.    . ramipril (ALTACE) 10 MG capsule TAKE 1 CAPSULE BY MOUTH TWO TIMES DAILY 180 capsule 3  . sildenafil (REVATIO) 20 MG tablet Take 2-5 tablets by mouth as needed prior to sexual activity (Patient not taking: Reported on 11/22/2016) 50 tablet 1    Objective: BP 122/65   Pulse 81   Temp 98.7 F (37.1 C) (Oral)   Resp 20   SpO2 96%  Exam: GEN: appears well, no apparent distress. Head: normocephalic and atraumatic  Eyes: conjunctiva without injection, sclera anicteric Nares: no rhinorrhea, congestion or erythema Oropharynx: mmm without erythema or exudation HEM: negative for cervical or periauricular lymphadenopathies CVS: RRR, nl s1 & s2, no murmurs, no edema, cap refills brisk RESP: no IWOB, good air movement bilaterally, CTAB GI: BS present & normal, soft, NTND, no guarding, no rebound, no mass GU: no suprapubic or CVA tenderness MSK: no focal tenderness or notable swelling SKIN: no apparent skin lesion ENDO: negative thyromegally NEURO: alert and oiented appropriately, no gross deficits  PSYCH: euthymic mood with congruent affect. No suicidal or homicidal ideation.  Labs and Imaging: CBC BMET   Recent Labs Lab 11/22/16 1335  WBC 7.3  HGB 15.9  HCT 49.0  PLT 154    Recent Labs Lab 11/22/16 1335  NA 131*  K 3.9  CL 103  CO2 16*  BUN 23*  CREATININE 2.80*  GLUCOSE 169*  CALCIUM 8.7*     Ct Abdomen Pelvis Wo Contrast  Result Date: 11/22/2016 CLINICAL DATA:  Diarrhea and emesis for 3 days. Possible small bowel obstruction on radiograph. EXAM: CT ABDOMEN AND  PELVIS WITHOUT CONTRAST TECHNIQUE: Multidetector CT imaging of the abdomen and pelvis was performed following the standard protocol without IV contrast. COMPARISON:  None. FINDINGS: Lower chest: Clear lung bases. Normal heart size wiThout  pericardial or pleural effusion. Prior median sternotomy. Hepatobiliary: Hepatic steatosis and hepatomegaly at 21.7 cm craniocaudal. Gallbladder distention, without specific evidence of acute cholecystitis. No biliary duct dilatation. Pancreas: Normal, without mass or ductal dilatation. Spleen: Normal in size, without focal abnormality. Adrenals/Urinary Tract: Normal adrenal glands. An upper pole left renal lesion measures 1.3 cm and 18 HU, favoring a cyst or minimally complex cyst. No renal calculi or hydronephrosis. No hydroureter or ureteric calculi. No bladder calculi. Stomach/Bowel: Normal stomach, without wall thickening. Fluid-filled colon, consistent with a diarrheal state. Scattered colonic diverticula. Normal terminal ileum. Appendix not visualized. Normal small bowel. Vascular/Lymphatic: Aortic and branch vessel atherosclerosis. No abdominopelvic adenopathy. Reproductive: Normal prostate. Other: No significant free fluid. Musculoskeletal: Fusion of the bilateral sacroiliac joints consistent with the clinical history of ankylosing spondylitis. Lower thoracic spine changes are also consistent with ankylosing spondylitis. IMPRESSION: 1.  No acute process in the abdomen or pelvis. 2. Nonspecific gallbladder distension. 3. Hepatic steatosis and hepatomegaly. 4.  Aortic Atherosclerosis (ICD10-I70.0). 5. Fluid-filled colon, consistent with a diarrheal state. 6. Ankylosing spondylitis. Electronically Signed   By: Abigail Miyamoto M.D.   On: 11/22/2016 17:47   Dg Abd 1 View  Result Date: 11/22/2016 CLINICAL DATA:  Abdominal pain and diarrhea for 3 days EXAM: ABDOMEN - 1 VIEW COMPARISON:  None. FINDINGS: The bowel gas pattern is normal. No radio-opaque calculi or other significant  radiographic abnormality are seen. IMPRESSION: Negative. Electronically Signed   By: Kerby Moors M.D.   On: 11/22/2016 21:33    Mercy Riding, MD 11/22/2016, 9:32 PM PGY-3, [facility] pager: [phone], text pages welcome

## 2016-11-22 NOTE — ED Triage Notes (Addendum)
Pt sent here from [facility] for bowel blockage and was told they would need possible exploratory surgery. Pt comes with a disk from the medical center with Xray results that say "possible small bowel obstruction." Pt has had diarrhea for 4 days, with some vomiting.

## 2016-11-22 NOTE — Progress Notes (Signed)
Received report from ED RN, Gerald Stabs.

## 2016-11-22 NOTE — ED Notes (Signed)
Pt ambulated to bathroom 

## 2016-11-22 NOTE — Progress Notes (Signed)
NURSING PROGRESS NOTE  Gregory Hansen [id number] Admission Data: 11/22/2016 11:47 PM Attending Provider: Alveda Reasons, MD [id number], No Pcp Per Code Status: Full  Allergies:  Patient has no known allergies. Past Medical History:   has a past medical history of Anginal pain ([place]); Anxiety; Arthritis; Arthritis; Coronary artery disease; Coronary artery disease; Depression; GERD (gastroesophageal reflux disease); Hyperlipidemia; Hyperlipidemia; Hypertension; MI (myocardial infarction) ([place]); Myocardial infarction [facility]); and Spondylitis, ankylosing ([place]). Past Surgical History:   has a past surgical history that includes Cardiac catheterization; Coronary angioplasty with stent; Coronary angioplasty with stent (03/26/2013); Finger surgery; Coronary artery bypass graft (N/A, 10/29/2013); Intraoprative transesophageal echocardiogram (N/A, 10/29/2013); left heart catheterization with coronary angiogram (N/A, 10/01/2011); left heart catheterization with coronary angiogram (N/A, 03/26/2013); left heart catheterization with coronary angiogram (N/A, 10/25/2013); Coronary artery bypass graft; and Coronary angioplasty with stent. Social History:   reports that he quit smoking about 37 years ago. His smoking use included Cigarettes. He has a 20.00 pack-year smoking history. He has never used smokeless tobacco. He reports that he drinks about 13.2 oz of alcohol per week . He reports that he does not use drugs.  Gregory Hansen is a 66 y.o. male patient admitted from ED:   Last Documented Vital Signs: Blood pressure (!) 141/85, pulse 90, temperature 98.4 F (36.9 C), temperature source Oral, resp. rate 18, height 6\' 2"  (1.88 m), weight 110.7 kg (244 lb), SpO2 100 %.  Cardiac Monitoring: Box # 28 in place. Cardiac monitor yields:normal sinus rhythm.  IV Fluids:  IV in place, occlusive dsg intact without redness, IV cath antecubital left, condition patent and no redness D5W/0.45 NaCl.   Skin: Appropriate for  ethnicity with abrasion on left lower leg, right great toe, and redness to penis.  Patient orientated to room. Information packet given to patient. Admission inpatient armband information verified with patient to include name and date of birth and placed on patient arm. Side rails up x 2, fall assessment and education completed with patient. Patient able to verbalize understanding of risk associated with falls and verbalized understanding to call for assistance before getting out of bed. Call light within reach. Patient able to voice and demonstrate understanding of unit orientation instructions.    Will continue to evaluate and treat per MD orders.  Clydell Hakim RN, BSN

## 2016-11-22 NOTE — ED Notes (Signed)
Patient returned from CT

## 2016-11-22 NOTE — ED Notes (Signed)
Admitting at bedside 

## 2016-11-23 ENCOUNTER — Encounter (HOSPITAL_COMMUNITY): Payer: Self-pay

## 2016-11-23 DIAGNOSIS — R160 Hepatomegaly, not elsewhere classified: Secondary | ICD-10-CM | POA: Diagnosis present

## 2016-11-23 DIAGNOSIS — Z951 Presence of aortocoronary bypass graft: Secondary | ICD-10-CM | POA: Diagnosis not present

## 2016-11-23 DIAGNOSIS — Z23 Encounter for immunization: Secondary | ICD-10-CM | POA: Diagnosis present

## 2016-11-23 DIAGNOSIS — A072 Cryptosporidiosis: Secondary | ICD-10-CM | POA: Diagnosis present

## 2016-11-23 DIAGNOSIS — R739 Hyperglycemia, unspecified: Secondary | ICD-10-CM | POA: Diagnosis present

## 2016-11-23 DIAGNOSIS — E860 Dehydration: Secondary | ICD-10-CM

## 2016-11-23 DIAGNOSIS — I959 Hypotension, unspecified: Secondary | ICD-10-CM | POA: Diagnosis present

## 2016-11-23 DIAGNOSIS — I25810 Atherosclerosis of coronary artery bypass graft(s) without angina pectoris: Secondary | ICD-10-CM

## 2016-11-23 DIAGNOSIS — E785 Hyperlipidemia, unspecified: Secondary | ICD-10-CM | POA: Diagnosis present

## 2016-11-23 DIAGNOSIS — M459 Ankylosing spondylitis of unspecified sites in spine: Secondary | ICD-10-CM | POA: Diagnosis present

## 2016-11-23 DIAGNOSIS — F329 Major depressive disorder, single episode, unspecified: Secondary | ICD-10-CM | POA: Diagnosis present

## 2016-11-23 DIAGNOSIS — E872 Acidosis: Secondary | ICD-10-CM | POA: Diagnosis present

## 2016-11-23 DIAGNOSIS — Z7902 Long term (current) use of antithrombotics/antiplatelets: Secondary | ICD-10-CM | POA: Diagnosis not present

## 2016-11-23 DIAGNOSIS — I10 Essential (primary) hypertension: Secondary | ICD-10-CM | POA: Diagnosis present

## 2016-11-23 DIAGNOSIS — N179 Acute kidney failure, unspecified: Secondary | ICD-10-CM

## 2016-11-23 DIAGNOSIS — K219 Gastro-esophageal reflux disease without esophagitis: Secondary | ICD-10-CM | POA: Diagnosis present

## 2016-11-23 DIAGNOSIS — R809 Proteinuria, unspecified: Secondary | ICD-10-CM | POA: Diagnosis present

## 2016-11-23 DIAGNOSIS — R197 Diarrhea, unspecified: Secondary | ICD-10-CM | POA: Diagnosis not present

## 2016-11-23 DIAGNOSIS — D696 Thrombocytopenia, unspecified: Secondary | ICD-10-CM | POA: Diagnosis not present

## 2016-11-23 DIAGNOSIS — I2510 Atherosclerotic heart disease of native coronary artery without angina pectoris: Secondary | ICD-10-CM | POA: Diagnosis present

## 2016-11-23 DIAGNOSIS — Z87891 Personal history of nicotine dependence: Secondary | ICD-10-CM | POA: Diagnosis not present

## 2016-11-23 DIAGNOSIS — E876 Hypokalemia: Secondary | ICD-10-CM | POA: Diagnosis present

## 2016-11-23 DIAGNOSIS — Z79899 Other long term (current) drug therapy: Secondary | ICD-10-CM | POA: Diagnosis not present

## 2016-11-23 DIAGNOSIS — F419 Anxiety disorder, unspecified: Secondary | ICD-10-CM | POA: Diagnosis present

## 2016-11-23 DIAGNOSIS — I252 Old myocardial infarction: Secondary | ICD-10-CM | POA: Diagnosis not present

## 2016-11-23 DIAGNOSIS — K760 Fatty (change of) liver, not elsewhere classified: Secondary | ICD-10-CM | POA: Diagnosis present

## 2016-11-23 LAB — COMPREHENSIVE METABOLIC PANEL
ALK PHOS: 50 U/L (ref 38–126)
ALT: 32 U/L (ref 17–63)
ALT: 30 U/L (ref 17–63)
ANION GAP: 7 (ref 5–15)
ANION GAP: 10 (ref 5–15)
AST: 55 U/L — ABNORMAL HIGH (ref 15–41)
AST: 48 U/L — ABNORMAL HIGH (ref 15–41)
Albumin: 3.3 g/dL — ABNORMAL LOW (ref 3.5–5.0)
Albumin: 3.1 g/dL — ABNORMAL LOW (ref 3.5–5.0)
Alkaline Phosphatase: 55 U/L (ref 38–126)
BILIRUBIN TOTAL: 0.7 mg/dL (ref 0.3–1.2)
BUN: 27 mg/dL — ABNORMAL HIGH (ref 6–20)
BUN: 24 mg/dL — ABNORMAL HIGH (ref 6–20)
CALCIUM: 7.4 mg/dL — AB (ref 8.9–10.3)
CHLORIDE: 106 mmol/L (ref 101–111)
CO2: 17 mmol/L — ABNORMAL LOW (ref 22–32)
CO2: 14 mmol/L — AB (ref 22–32)
CREATININE: 1.74 mg/dL — AB (ref 0.61–1.24)
Calcium: 7.5 mg/dL — ABNORMAL LOW (ref 8.9–10.3)
Chloride: 110 mmol/L (ref 101–111)
Creatinine, Ser: 2.3 mg/dL — ABNORMAL HIGH (ref 0.61–1.24)
GFR calc Af Amer: 45 mL/min — ABNORMAL LOW (ref >60)
GFR, EST AFRICAN AMERICAN: 32 mL/min — AB (ref >60)
GFR, EST NON AFRICAN AMERICAN: 39 mL/min — AB (ref >60)
GFR, EST NON AFRICAN AMERICAN: 28 mL/min — AB (ref >60)
Glucose, Bld: 98 mg/dL (ref 65–99)
Glucose, Bld: 105 mg/dL — ABNORMAL HIGH (ref 65–99)
POTASSIUM: 2.7 mmol/L — AB (ref 3.5–5.1)
Potassium: 3.5 mmol/L (ref 3.5–5.1)
SODIUM: 133 mmol/L — AB (ref 135–145)
Sodium: 131 mmol/L — ABNORMAL LOW (ref 135–145)
TOTAL PROTEIN: 7.4 g/dL (ref 6.5–8.1)
Total Bilirubin: 0.9 mg/dL (ref 0.3–1.2)
Total Protein: 7.9 g/dL (ref 6.5–8.1)

## 2016-11-23 LAB — CBC
HCT: 44.3 % (ref 39.0–52.0)
HCT: 45.2 % (ref 39.0–52.0)
Hemoglobin: 14.7 g/dL (ref 13.0–17.0)
Hemoglobin: 14.8 g/dL (ref 13.0–17.0)
MCH: 31.9 pg (ref 26.0–34.0)
MCH: 32.5 pg (ref 26.0–34.0)
MCHC: 32.7 g/dL (ref 30.0–36.0)
MCHC: 33.2 g/dL (ref 30.0–36.0)
MCV: 97.4 fL (ref 78.0–100.0)
MCV: 98 fL (ref 78.0–100.0)
PLATELETS: 123 10*3/uL — AB (ref 150–400)
Platelets: 121 10*3/uL — ABNORMAL LOW (ref 150–400)
RBC: 4.52 MIL/uL (ref 4.22–5.81)
RBC: 4.64 MIL/uL (ref 4.22–5.81)
RDW: 14.4 % (ref 11.5–15.5)
RDW: 14.7 % (ref 11.5–15.5)
WBC: 6.6 10*3/uL (ref 4.0–10.5)
WBC: 7.2 10*3/uL (ref 4.0–10.5)

## 2016-11-23 LAB — MAGNESIUM: Magnesium: 1.3 mg/dL — ABNORMAL LOW (ref 1.7–2.4)

## 2016-11-23 LAB — GASTROINTESTINAL PANEL BY PCR, STOOL (REPLACES STOOL CULTURE)
ASTROVIRUS: NOT DETECTED
Adenovirus F40/41: NOT DETECTED
CRYPTOSPORIDIUM: DETECTED — AB
CYCLOSPORA CAYETANENSIS: NOT DETECTED
Campylobacter species: NOT DETECTED
ENTEROAGGREGATIVE E COLI (EAEC): NOT DETECTED
ENTEROPATHOGENIC E COLI (EPEC): NOT DETECTED
ENTEROTOXIGENIC E COLI (ETEC): NOT DETECTED
Entamoeba histolytica: NOT DETECTED
GIARDIA LAMBLIA: NOT DETECTED
Norovirus GI/GII: NOT DETECTED
PLESIMONAS SHIGELLOIDES: NOT DETECTED
Rotavirus A: NOT DETECTED
Salmonella species: NOT DETECTED
Sapovirus (I, II, IV, and V): NOT DETECTED
Shiga like toxin producing E coli (STEC): NOT DETECTED
Shigella/Enteroinvasive E coli (EIEC): NOT DETECTED
VIBRIO CHOLERAE: NOT DETECTED
VIBRIO SPECIES: NOT DETECTED
YERSINIA ENTEROCOLITICA: NOT DETECTED

## 2016-11-23 LAB — MRSA PCR SCREENING: MRSA BY PCR: NEGATIVE

## 2016-11-23 LAB — HEMOGLOBIN A1C
Hgb A1c MFr Bld: 5.2 % (ref 4.8–5.6)
Mean Plasma Glucose: 102.54 mg/dL

## 2016-11-23 LAB — CREATININE, SERUM
Creatinine, Ser: 2.27 mg/dL — ABNORMAL HIGH (ref 0.61–1.24)
GFR, EST AFRICAN AMERICAN: 33 mL/min — AB (ref >60)
GFR, EST NON AFRICAN AMERICAN: 28 mL/min — AB (ref >60)

## 2016-11-23 LAB — LACTIC ACID, PLASMA
LACTIC ACID, VENOUS: 2.1 mmol/L — AB (ref 0.5–1.9)
Lactic Acid, Venous: 1.5 mmol/L (ref 0.5–1.9)
Lactic Acid, Venous: 1.1 mmol/L (ref 0.5–1.9)

## 2016-11-23 MED ORDER — MAGNESIUM SULFATE 2 GM/50ML IV SOLN
2.0000 g | Freq: Once | INTRAVENOUS | Status: AC
  Administered 2016-11-23: 2 g via INTRAVENOUS
  Filled 2016-11-23: qty 50

## 2016-11-23 MED ORDER — ENOXAPARIN SODIUM 40 MG/0.4ML ~~LOC~~ SOLN
40.0000 mg | Freq: Every day | SUBCUTANEOUS | Status: DC
  Administered 2016-11-24: 40 mg via SUBCUTANEOUS
  Filled 2016-11-23: qty 0.4

## 2016-11-23 MED ORDER — CLOPIDOGREL BISULFATE 75 MG PO TABS
75.0000 mg | ORAL_TABLET | Freq: Every day | ORAL | Status: DC
  Administered 2016-11-23 – 2016-11-24 (×2): 75 mg via ORAL
  Filled 2016-11-23 (×2): qty 1

## 2016-11-23 MED ORDER — POTASSIUM CHLORIDE 10 MEQ/100ML IV SOLN
10.0000 meq | INTRAVENOUS | Status: AC
  Administered 2016-11-23 (×6): 10 meq via INTRAVENOUS
  Filled 2016-11-23 (×6): qty 100

## 2016-11-23 MED ORDER — SODIUM CHLORIDE 0.9 % IV BOLUS (SEPSIS)
1000.0000 mL | Freq: Once | INTRAVENOUS | Status: AC
  Administered 2016-11-23: 1000 mL via INTRAVENOUS

## 2016-11-23 NOTE — Progress Notes (Signed)
Lab phoned with Positive GI Panel for Cryptosporidium, have paged Dr Bretta Bang T. Gonfa; waiting for call back.

## 2016-11-23 NOTE — Progress Notes (Signed)
Critical lab values called to RN of lactic acid of 2.1 at 0041 and potassium 2.7 at 0107.  MD called RN and stated that he did not see the results in patient's chart. RN and charge RN discovered that results were recorded for 11/22/2016 0002 and 11/22/2016 0007 entered in error instead of the date of 11/23/2016.  When result is clicked on comment reads that results were read to RN on 11/23/2016 at [place] [facility] [place].  RN paged MD back to notify him of the error. Lab notified of error and will fix on their end.  Will continue to monitor patient and notify MD as needed.

## 2016-11-23 NOTE — Progress Notes (Signed)
CRITICAL VALUE ALERT  Critical Value:  Lactic acid 2.1  Date & Time Notied:  11/23/2016 0041  Provider Notified: On-call coverage for [facility] Medicine Teaching Service.  Orders Received/Actions taken: No new orders. Continue to monitor patient.

## 2016-11-23 NOTE — Progress Notes (Signed)
Family Medicine Teaching Service Daily Progress Note Intern Pager: [phone]  Patient name: Gregory Hansen Medical record number: [id number] Date of birth: Sep 12, 1950 Age: 66 y.o. Gender: male  Primary Care Provider: Patient, No Pcp Per Consultants: none Code Status: full  Pt Overview and Major Events to Date:  9/14: Admitted due to dehydration and AKI from acute diarrhea and vomiting  Assessment and Plan: Gregory Hansen is a 66 year old male presenting with acute diarrhea and emesis. PMH is significant for HTN, CAD status post stent 2 and quadruple CABG, depression, anxiety, GERD and ankylosing spondylitis  Acute diarrhea/emesis: Improved. Still with watery diarrhea but spaced out. One BM since midnight. Likely due to viral gastroenteritis. C. difficile negative. KUB and CT abdomen negative for surgical abdomen or any significant acute finding. Status post 3 L of NS in ED. Low urine output but improved. -Normal saline bolus 1 L -Continue NS 1/2 NS -Follow-up GI panel -Repeat lactic acid -Follow up CMP -Need colonoscopy as an outpatient  AKI: Improved. Likely prerenal due to dehydration from diarrhea and emesis. sCr down from 2.8-2.2. Baseline 0.9.  -Hold home ramipril. -Rehydrated as above -Follow up CMP  Lactic acidosis/metabolic acidosis: LA down from 3.28-1.2, then back up to 2.1. Wrong time Epic. Confirmed with lab. They will fix the time. Likely due to diarrhea, dehydration and AKI -Hydrated as above -Recheck lactic acid and CMP  Hypokalemia: K down to 2.7. Likely due to GI loss from emesis/diarrhea. Wrong time put in Epic. Confirmed time with lab. They will fix the time.  -Replacing K -Check CMP -Check Mg  Hyperglycemia: BMP glucose 169 on admission. No history of diabetes -Follow up A1c -CBG every 12 hours  History of CAD s/p stent 2 in 2002 and 2006 and quadruple CABG in 2015. Patient with mild exertional dyspnea and heaviness in his legs bilaterally. Troponin and  EKG negative for ACS.  -Continue cardiac monitoring for total of 24hrs -Metoprolol 50 mg twice a day -Continue home aspirin, Plavix and atorvastatin. Not sure if he still needs to be on Plavix but will defer this to PCP and his cardiologist  Hepatic steatosis/hepatomegaly on CT: Former heavy drinker. He has mildly elevated AST which is nonspecific.  -Repeat CMP  Thrombocytopenia: Platelet down to 121 this morning. 154 on admission. Likely hemodilution from IV fluids -We will check CBC  Proteinuria: UA with urine protein to 100 -Recommend repeating UA as an outpatient  Depression/anxiety: Stable. Denies SI/HI -Continue home Wellbutrin and nefazodone  FEN/GI: -Heart healthy diet -Continue on Protonix  PPx: -Lovenox. Adjust dose when renal function improves  Disposition: Continue med surg pending clinical improvement  Subjective:  Reports significant improvement. Last bowel movement about midnight. Still watery. Denies blood in stool. Mild cramping. Urine output low but improving.  Objective: Temp:  [98.4 F (36.9 C)-98.7 F (37.1 C)] 98.5 F (36.9 C) (09/15 0555) Pulse Rate:  [66-101] 66 (09/15 0555) Resp:  [16-30] 18 (09/15 0555) BP: (93-141)/(55-85) 121/58 (09/15 0555) SpO2:  [93 %-100 %] 98 % (09/15 0555) Weight:  [244 lb (110.7 kg)] 244 lb (110.7 kg) (09/14 2258) Physical Exam: General: Appears well, in no acute distress Cardiovascular: RRR, S1 and S2 heard, no murmurs, no edema Respiratory: No increased work of breathing, good air movement bilaterally, CTA B Abdomen: Limited exam due to body habitus. Bowel sounds normal. No tenderness to palpation Extremities: No apparent skin lesion, no edema or focal tenderness  Laboratory:  Recent Labs Lab 11/22/16 0007 11/22/16 1335 11/22/16 2336  WBC  7.2 7.3 6.6  HGB 14.7 15.9 14.8  HCT 44.3 49.0 45.2  PLT 123* 154 121*    Recent Labs Lab 11/22/16 0007 11/22/16 1335 11/22/16 2336  NA 133* 131*  --   K 2.7* 3.9   --   CL 106 103  --   CO2 17* 16*  --   BUN 27* 23*  --   CREATININE 2.30* 2.80* 2.27*  CALCIUM 7.5* 8.7*  --   PROT 7.9 9.0*  --   BILITOT 0.9 1.3*  --   ALKPHOS 55 65  --   ALT 32 38  --   AST 55* 63*  --   GLUCOSE 98 169*  --     Imaging/Diagnostic Tests: Ct Abdomen Pelvis Wo Contrast  Result Date: 11/22/2016 CLINICAL DATA:  Diarrhea and emesis for 3 days. Possible small bowel obstruction on radiograph. EXAM: CT ABDOMEN AND PELVIS WITHOUT CONTRAST TECHNIQUE: Multidetector CT imaging of the abdomen and pelvis was performed following the standard protocol without IV contrast. COMPARISON:  None. FINDINGS: Lower chest: Clear lung bases. Normal heart size wiThout pericardial or pleural effusion. Prior median sternotomy. Hepatobiliary: Hepatic steatosis and hepatomegaly at 21.7 cm craniocaudal. Gallbladder distention, without specific evidence of acute cholecystitis. No biliary duct dilatation. Pancreas: Normal, without mass or ductal dilatation. Spleen: Normal in size, without focal abnormality. Adrenals/Urinary Tract: Normal adrenal glands. An upper pole left renal lesion measures 1.3 cm and 18 HU, favoring a cyst or minimally complex cyst. No renal calculi or hydronephrosis. No hydroureter or ureteric calculi. No bladder calculi. Stomach/Bowel: Normal stomach, without wall thickening. Fluid-filled colon, consistent with a diarrheal state. Scattered colonic diverticula. Normal terminal ileum. Appendix not visualized. Normal small bowel. Vascular/Lymphatic: Aortic and branch vessel atherosclerosis. No abdominopelvic adenopathy. Reproductive: Normal prostate. Other: No significant free fluid. Musculoskeletal: Fusion of the bilateral sacroiliac joints consistent with the clinical history of ankylosing spondylitis. Lower thoracic spine changes are also consistent with ankylosing spondylitis. IMPRESSION: 1.  No acute process in the abdomen or pelvis. 2. Nonspecific gallbladder distension. 3. Hepatic  steatosis and hepatomegaly. 4.  Aortic Atherosclerosis (ICD10-I70.0). 5. Fluid-filled colon, consistent with a diarrheal state. 6. Ankylosing spondylitis. Electronically Signed   By: Abigail Miyamoto M.D.   On: 11/22/2016 17:47   Dg Abd 1 View  Result Date: 11/22/2016 CLINICAL DATA:  Abdominal pain and diarrhea for 3 days EXAM: ABDOMEN - 1 VIEW COMPARISON:  None. FINDINGS: The bowel gas pattern is normal. No radio-opaque calculi or other significant radiographic abnormality are seen. IMPRESSION: Negative. Electronically Signed   By: Kerby Moors M.D.   On: 11/22/2016 21:33    Mercy Riding, MD 11/23/2016, 6:27 AM PGY-3, [place] Intern pager: [phone], text pages welcome

## 2016-11-23 NOTE — Progress Notes (Signed)
CRITICAL VALUE ALERT  Critical Value:  Potassium 2.7   Date & Time Notied:  11/23/2016 0107  Provider Notified: On-call coverage for [facility] Medicine Teaching Services.  Orders Received/Actions taken: IVPB potassium 10 mg x 6 ordered

## 2016-11-24 LAB — BASIC METABOLIC PANEL
ANION GAP: 6 (ref 5–15)
BUN: 10 mg/dL (ref 6–20)
CHLORIDE: 110 mmol/L (ref 101–111)
CO2: 17 mmol/L — AB (ref 22–32)
Calcium: 7.6 mg/dL — ABNORMAL LOW (ref 8.9–10.3)
Creatinine, Ser: 1.01 mg/dL (ref 0.61–1.24)
GFR calc non Af Amer: >60 mL/min (ref >60)
Glucose, Bld: 106 mg/dL — ABNORMAL HIGH (ref 65–99)
Potassium: 3.2 mmol/L — ABNORMAL LOW (ref 3.5–5.1)
Sodium: 133 mmol/L — ABNORMAL LOW (ref 135–145)

## 2016-11-24 LAB — CBC
HEMATOCRIT: 40.2 % (ref 39.0–52.0)
HEMOGLOBIN: 13.7 g/dL (ref 13.0–17.0)
MCH: 32.6 pg (ref 26.0–34.0)
MCHC: 34.1 g/dL (ref 30.0–36.0)
MCV: 95.7 fL (ref 78.0–100.0)
Platelets: 120 10*3/uL — ABNORMAL LOW (ref 150–400)
RBC: 4.2 MIL/uL — ABNORMAL LOW (ref 4.22–5.81)
RDW: 14.3 % (ref 11.5–15.5)
WBC: 6.4 10*3/uL (ref 4.0–10.5)

## 2016-11-24 LAB — MAGNESIUM: Magnesium: 1.6 mg/dL — ABNORMAL LOW (ref 1.7–2.4)

## 2016-11-24 LAB — GLUCOSE, CAPILLARY: Glucose-Capillary: 114 mg/dL — ABNORMAL HIGH (ref 65–99)

## 2016-11-24 MED ORDER — POTASSIUM CHLORIDE CRYS ER 20 MEQ PO TBCR
40.0000 meq | EXTENDED_RELEASE_TABLET | Freq: Two times a day (BID) | ORAL | Status: DC
  Administered 2016-11-24: 40 meq via ORAL
  Filled 2016-11-24: qty 2

## 2016-11-24 MED ORDER — ATORVASTATIN CALCIUM 80 MG PO TABS: ORAL_TABLET | ORAL | 3 refills | Status: DC

## 2016-11-24 MED ORDER — MAGNESIUM SULFATE 2 GM/50ML IV SOLN
2.0000 g | Freq: Once | INTRAVENOUS | Status: AC
  Administered 2016-11-24: 2 g via INTRAVENOUS
  Filled 2016-11-24: qty 50

## 2016-11-24 NOTE — Progress Notes (Signed)
Family Medicine Teaching Service Daily Progress Note Intern Pager: [phone]  Patient name: Gregory Hansen Medical record number: [id number] Date of birth: 07-13-1950 Age: 66 y.o. Gender: male  Primary Care Provider: Patient, No Pcp Per Consultants: none Code Status: full  Pt Overview and Major Events to Date:  9/14: Admitted due to dehydration and AKI from acute diarrhea and vomiting 9/16 GIPP with Cryptosporidium   Assessment and Plan: Gregory Hansen is a 66 year old male presenting with acute diarrhea and emesis. PMH is significant for HTN, CAD status post stent 2 and quadruple CABG, depression, anxiety, GERD and ankylosing spondylitis.   Acute diarrhea/emesis: Improved. 1 soft BM today. GI pathogen panel with Cryptosporidium. Status post 3 L of NS in ED 2/2 low urine output, patient euvolemic on exam and feels improved today. -d/c fluids -Need colonoscopy as an outpatient -advanced diet to [facility] -discharge today if patient feels well after solid breakfast  AKI: Resolved. Likely prerenal due to dehydration from diarrhea and emesis. sCr down from 2.8-2.2. Baseline 0.9.  -restart home ramipril on d/c -Rehydrated as above  Hypokalemia: K 3.2, likely due to GI loss.  -Replacing K  Hypomagnesemia: Mg 1.6.  -2g Mg now  Hyperglycemia: BMP glucose 169 on admission. A1c 5.2. -continue to follow per PCP outpatient  History of CAD s/p stent 2 in 2002 and 2006 and quadruple CABG in 2015. Patient with mild exertional dyspnea and heaviness in his legs bilaterally. Troponin and EKG negative for ACS.  -dc tele -Metoprolol 50 mg twice a day -Continue home aspirin, Plavix and atorvastatin. Not sure if he still needs to be on Plavix but will defer this to PCP and his cardiologist  Hepatic steatosis/hepatomegaly on CT: Former heavy drinker. He has mildly elevated AST which is nonspecific. Persistent on repeat though slightly improved. Differential includes AFLD, NAFLD, hepatic cirrhosis.   -follow up as an outpatient  Thrombocytopenia: Platelet down to 121. 154 on admission. Likely hemodilution from IV fluids. Stable on repeat CBC.   Proteinuria: UA with urine protein to 100 -Recommend repeating UA as an outpatient  Depression/anxiety: Stable. Denies SI/HI -Continue home Wellbutrin and nefazodone  FEN/GI: -Heart healthy diet -Continue on Protonix  PPx: -Lovenox. Adjust dose when renal function improves  Disposition: home today if able to tolerate PO  Subjective:  Reports significant improvement. Last bowel movement this am but formed. Feels like he can go home, eager to eat solid breakfast.   Objective: Temp:  [97.7 F (36.5 C)-98.4 F (36.9 C)] 98.4 F (36.9 C) (09/16 0612) Pulse Rate:  [63-66] 64 (09/16 0612) Resp:  [16-18] 16 (09/16 0612) BP: (106-147)/(56-76) 129/74 (09/16 0612) SpO2:  [99 %-100 %] 99 % (09/16 0612) Weight:  [246 lb 6.4 oz (111.8 kg)] 246 lb 6.4 oz (111.8 kg) (09/16 0500) Physical Exam: General: Appears well, in no acute distress Cardiovascular: RRR, S1 and S2 heard, no murmurs, no edema Respiratory: No increased work of breathing, good air movement bilaterally, CTA B Abdomen: Limited exam due to body habitus. Bowel sounds mildly hyperactive. No tenderness to palpation Extremities: No apparent skin lesion, no edema or focal tenderness  Laboratory:  Recent Labs Lab 11/22/16 1335 11/22/16 2336 11/24/16 0656  WBC 7.3 6.6 6.4  HGB 15.9 14.8 13.7  HCT 49.0 45.2 40.2  PLT 154 121* 120*    Recent Labs Lab 11/22/16 0007 11/22/16 1335 11/22/16 2336 11/23/16 0744 11/24/16 0656  NA 133* 131*  --  131* 133*  K 2.7* 3.9  --  3.5 3.2*  CL  106 103  --  110 110  CO2 17* 16*  --  14* 17*  BUN 27* 23*  --  24* 10  CREATININE 2.30* 2.80* 2.27* 1.74* 1.01  CALCIUM 7.5* 8.7*  --  7.4* 7.6*  PROT 7.9 9.0*  --  7.4  --   BILITOT 0.9 1.3*  --  0.7  --   ALKPHOS 55 65  --  50  --   ALT 32 38  --  30  --   AST 55* 63*  --  48*  --    GLUCOSE 98 169*  --  105* 106*    Imaging/Diagnostic Tests: No results found.  Sela Hilding, MD 11/24/2016, 10:03 AM PGY-2, [place] Intern pager: [phone], text pages welcome

## 2016-11-24 NOTE — Progress Notes (Signed)
Gregory Hansen to be D/C'd home per MD order.  Discussed with the patient and all questions fully answered.  VSS, Skin clean, dry and intact without evidence of skin break down, no evidence of skin tears noted. IV catheter discontinued intact. Site without signs and symptoms of complications. Dressing and pressure applied.  An After Visit Summary was printed and given to the patient. Patient received prescription.  D/c education completed with patient/family including follow up instructions, medication list, d/c activities limitations if indicated, with other d/c instructions as indicated by MD - patient able to verbalize understanding, all questions fully answered.   Patient instructed to return to ED, call 911, or call MD for any changes in condition.   Patient escorted via [place], and D/C home via private auto.  Gregory Hansen 11/24/2016 3:50 PM

## 2016-11-24 NOTE — Discharge Summary (Signed)
[facility] Discharge Summary  Patient name: Gregory Hansen Medical record number: [id number] Date of birth: 24-Nov-1950 Age: 66 y.o. Gender: male Date of Admission: 11/22/2016  Date of Discharge: 11/24/16  Admitting Physician: Alveda Reasons, MD  Primary Care Provider: Patient, No Pcp Per Consultants: none  Indication for Hospitalization: diarrhea, dehydration  Discharge Diagnoses/Problem List:  Diarrhea, improving 2/2 Cryptosporidium infection AKI, resolved Hypokalemia and hypomagnesemia, treated HX of CAD and CABG Hepatic steatosis/hepatomegaly on CT Proteinuria Depression  Disposition: home  Discharge Condition: improved  Discharge Exam: see progress note from day of discharge  Brief Hospital Course:  [place] [facility]ix presented with diarrhea x 3 days without known exposure or sick contacts, approximately 7 stools per day, as well as emesis x2 episodes. Went to PCP, KUB c/w SBO, and patient was directed to ED for this. On presentation to ED, tachycardic, hypotensive, with lactic acidosis to 3.28. These vital sign and lab abnormalities resolved with hydration. CT abdomen with nonspecific gallbladder distention, hepatic steatosis and hepatomegaly, fluid-filled colon consistent with diarrheal state otherwise no acute finding. Standing KUB negative for SBO. Patient was started on cipro and flagyl for possible intrabdominal infection by ED MD, but these were discontinued on admission. AKI on admission with Cr to 2.8 likely 2/2 dehydration, which resolved with fluids. Given CAD, troponins were checked and negative x 1. EKG unchanged. Ramipril was held during admission 2/2 AKI. GI pathogen panel resulted with + Cryptosporidium, and patient had significant improvement of diarrhea (one BM per day x 2, formed) prior to discharge. Discharged with symptomatic treatment.   Issues for Follow Up:  1. Cryptosporidium infection - no role for antiparasitics unless he were  to significantly worsen (ex. >2 weeks of diarrhea).  2. CAD - unsure if patient still needs both ASA and plavix, will defer discontinuing plavix to PCP or cardiology.  3. Proteinuria - consider repeating UA as an outpatient when well.  4. Patient due for colonoscopy.  Significant Procedures: none  Significant Labs and Imaging:   Recent Labs Lab 11/22/16 1335 11/22/16 2336 11/24/16 0656  WBC 7.3 6.6 6.4  HGB 15.9 14.8 13.7  HCT 49.0 45.2 40.2  PLT 154 121* 120*    Recent Labs Lab 11/22/16 0007 11/22/16 1335 11/22/16 2336 11/23/16 0744 11/24/16 0656  NA 133* 131*  --  131* 133*  K 2.7* 3.9  --  3.5 3.2*  CL 106 103  --  110 110  CO2 17* 16*  --  14* 17*  GLUCOSE 98 169*  --  105* 106*  BUN 27* 23*  --  24* 10  CREATININE 2.30* 2.80* 2.27* 1.74* 1.01  CALCIUM 7.5* 8.7*  --  7.4* 7.6*  MG  --   --   --  1.3* 1.6*  ALKPHOS 55 65  --  50  --   AST 55* 63*  --  48*  --   ALT 32 38  --  30  --   ALBUMIN 3.3* 3.7  --  3.1*  --    Ct Abdomen Pelvis Wo Contrast  Result Date: 11/22/2016 CLINICAL DATA:  Diarrhea and emesis for 3 days. Possible small bowel obstruction on radiograph. EXAM: CT ABDOMEN AND PELVIS WITHOUT CONTRAST TECHNIQUE: Multidetector CT imaging of the abdomen and pelvis was performed following the standard protocol without IV contrast. COMPARISON:  None. FINDINGS: Lower chest: Clear lung bases. Normal heart size wiThout pericardial or pleural effusion. Prior median sternotomy. Hepatobiliary: Hepatic steatosis and hepatomegaly at 21.7 cm  craniocaudal. Gallbladder distention, without specific evidence of acute cholecystitis. No biliary duct dilatation. Pancreas: Normal, without mass or ductal dilatation. Spleen: Normal in size, without focal abnormality. Adrenals/Urinary Tract: Normal adrenal glands. An upper pole left renal lesion measures 1.3 cm and 18 HU, favoring a cyst or minimally complex cyst. No renal calculi or hydronephrosis. No hydroureter or ureteric calculi.  No bladder calculi. Stomach/Bowel: Normal stomach, without wall thickening. Fluid-filled colon, consistent with a diarrheal state. Scattered colonic diverticula. Normal terminal ileum. Appendix not visualized. Normal small bowel. Vascular/Lymphatic: Aortic and branch vessel atherosclerosis. No abdominopelvic adenopathy. Reproductive: Normal prostate. Other: No significant free fluid. Musculoskeletal: Fusion of the bilateral sacroiliac joints consistent with the clinical history of ankylosing spondylitis. Lower thoracic spine changes are also consistent with ankylosing spondylitis. IMPRESSION: 1.  No acute process in the abdomen or pelvis. 2. Nonspecific gallbladder distension. 3. Hepatic steatosis and hepatomegaly. 4.  Aortic Atherosclerosis (ICD10-I70.0). 5. Fluid-filled colon, consistent with a diarrheal state. 6. Ankylosing spondylitis. Electronically Signed   By: Abigail Miyamoto M.D.   On: 11/22/2016 17:47   Dg Abd 1 View  Result Date: 11/22/2016 CLINICAL DATA:  Abdominal pain and diarrhea for 3 days EXAM: ABDOMEN - 1 VIEW COMPARISON:  None. FINDINGS: The bowel gas pattern is normal. No radio-opaque calculi or other significant radiographic abnormality are seen. IMPRESSION: Negative. Electronically Signed   By: Kerby Moors M.D.   On: 11/22/2016 21:33     Results/Tests Pending at Time of Discharge: none  Discharge Medications:  Allergies as of 11/24/2016   No Known Allergies     Medication List    STOP taking these medications   multivitamin with minerals Tabs tablet   PROBIOTIC PO   sildenafil 20 MG tablet Commonly known as:  REVATIO     TAKE these medications   aspirin 81 MG tablet Take 81 mg by mouth daily.   atorvastatin 80 MG tablet Commonly known as:  LIPITOR TAKE 40MG  BY MOUTH ONCE  DAILY   buPROPion 300 MG 24 hr tablet Commonly known as:  WELLBUTRIN XL Take 300 mg by mouth daily.   clopidogrel 75 MG tablet Commonly known as:  PLAVIX Take 75 mg by mouth daily.    desonide 0.05 % cream Commonly known as:  DESOWEN Apply 1 application topically daily as needed (to face).   fluticasone 50 MCG/ACT nasal spray Commonly known as:  FLONASE Place 2 sprays into both nostrils daily.   KLS ALLER-TEC 10 MG tablet Generic drug:  cetirizine Take 10 mg by mouth daily.   metoprolol succinate 100 MG 24 hr tablet Commonly known as:  TOPROL-XL Take 1 tablet (100 mg total) by mouth daily. Take with or immediately following a meal.   nefazodone 200 MG tablet Commonly known as:  SERZONE Take 300 mg by mouth daily.   nitroGLYCERIN 0.4 MG SL tablet Commonly known as:  NITROSTAT Place 0.4 mg under the tongue every 5 (five) minutes as needed for chest pain. Up to 3 doses   pantoprazole 40 MG tablet Commonly known as:  PROTONIX TAKE 1 TABLET BY MOUTH  DAILY   prednisoLONE acetate 1 % ophthalmic suspension Commonly known as:  PRED FORTE Place 1 drop into both eyes daily as needed.   ramipril 10 MG capsule Commonly known as:  ALTACE TAKE 1 CAPSULE BY MOUTH TWO TIMES DAILY   vitamin B-12 1000 MCG tablet Commonly known as:  CYANOCOBALAMIN Take 1,000 mcg by mouth daily.  Discharge Care Instructions        Start     Ordered   11/24/16 0000  atorvastatin (LIPITOR) 80 MG tablet     11/24/16 1202   11/24/16 0000  Increase activity slowly     11/24/16 1202   11/24/16 0000  Diet - low sodium heart healthy     11/24/16 1202   11/24/16 0000  Discharge instructions    Comments:  Call your doctor if you have many bouts of diarrhea or are concerned that you are getting dehydrated. Continue to try to drink water and eat food as you can tolerate. If you have diarrhea, you can try the BRAT diet (banana, rice, applesauce, toast).   11/24/16 1202      Discharge Instructions: Please refer to Patient Instructions section of EMR for full details.  Patient was counseled important signs and symptoms that should prompt return to medical care, changes in  medications, dietary instructions, activity restrictions, and follow up appointments.   Follow-Up Appointments: Follow up with PCP Within the week.   Sela Hilding, MD 11/24/2016, 12:11 PM PGY-2, [place]

## 2016-12-02 ENCOUNTER — Encounter (HOSPITAL_COMMUNITY): Payer: Self-pay

## 2016-12-02 ENCOUNTER — Inpatient Hospital Stay (HOSPITAL_COMMUNITY)
Admission: EM | Admit: 2016-12-02 | Discharge: 2016-12-07 | DRG: 872 | Disposition: A | Discharge status: Home / Self Care | Payer: Medicare Other | Attending: Family Medicine | Admitting: Family Medicine

## 2016-12-02 DIAGNOSIS — E782 Mixed hyperlipidemia: Secondary | ICD-10-CM | POA: Diagnosis present

## 2016-12-02 DIAGNOSIS — I4581 Long QT syndrome: Secondary | ICD-10-CM | POA: Diagnosis present

## 2016-12-02 DIAGNOSIS — E860 Dehydration: Secondary | ICD-10-CM

## 2016-12-02 DIAGNOSIS — Z955 Presence of coronary angioplasty implant and graft: Secondary | ICD-10-CM

## 2016-12-02 DIAGNOSIS — Z8249 Family history of ischemic heart disease and other diseases of the circulatory system: Secondary | ICD-10-CM

## 2016-12-02 DIAGNOSIS — E872 Acidosis, unspecified: Secondary | ICD-10-CM | POA: Diagnosis present

## 2016-12-02 DIAGNOSIS — I252 Old myocardial infarction: Secondary | ICD-10-CM

## 2016-12-02 DIAGNOSIS — F329 Major depressive disorder, single episode, unspecified: Secondary | ICD-10-CM | POA: Diagnosis present

## 2016-12-02 DIAGNOSIS — N179 Acute kidney failure, unspecified: Secondary | ICD-10-CM

## 2016-12-02 DIAGNOSIS — I2510 Atherosclerotic heart disease of native coronary artery without angina pectoris: Secondary | ICD-10-CM | POA: Diagnosis present

## 2016-12-02 DIAGNOSIS — E861 Hypovolemia: Secondary | ICD-10-CM | POA: Diagnosis present

## 2016-12-02 DIAGNOSIS — E8809 Other disorders of plasma-protein metabolism, not elsewhere classified: Secondary | ICD-10-CM | POA: Diagnosis present

## 2016-12-02 DIAGNOSIS — E869 Volume depletion, unspecified: Secondary | ICD-10-CM

## 2016-12-02 DIAGNOSIS — E876 Hypokalemia: Secondary | ICD-10-CM | POA: Diagnosis not present

## 2016-12-02 DIAGNOSIS — A419 Sepsis, unspecified organism: Secondary | ICD-10-CM | POA: Diagnosis not present

## 2016-12-02 DIAGNOSIS — Z951 Presence of aortocoronary bypass graft: Secondary | ICD-10-CM

## 2016-12-02 DIAGNOSIS — Z7982 Long term (current) use of aspirin: Secondary | ICD-10-CM

## 2016-12-02 DIAGNOSIS — F419 Anxiety disorder, unspecified: Secondary | ICD-10-CM | POA: Diagnosis present

## 2016-12-02 DIAGNOSIS — Z87891 Personal history of nicotine dependence: Secondary | ICD-10-CM

## 2016-12-02 DIAGNOSIS — I10 Essential (primary) hypertension: Secondary | ICD-10-CM | POA: Diagnosis present

## 2016-12-02 DIAGNOSIS — Z7902 Long term (current) use of antithrombotics/antiplatelets: Secondary | ICD-10-CM

## 2016-12-02 DIAGNOSIS — I700 Atherosclerosis of aorta: Secondary | ICD-10-CM | POA: Diagnosis present

## 2016-12-02 DIAGNOSIS — K219 Gastro-esophageal reflux disease without esophagitis: Secondary | ICD-10-CM | POA: Diagnosis present

## 2016-12-02 DIAGNOSIS — R6520 Severe sepsis without septic shock: Secondary | ICD-10-CM

## 2016-12-02 DIAGNOSIS — I959 Hypotension, unspecified: Secondary | ICD-10-CM

## 2016-12-02 DIAGNOSIS — E871 Hypo-osmolality and hyponatremia: Secondary | ICD-10-CM | POA: Diagnosis present

## 2016-12-02 DIAGNOSIS — A072 Cryptosporidiosis: Secondary | ICD-10-CM | POA: Diagnosis present

## 2016-12-02 DIAGNOSIS — Z8619 Personal history of other infectious and parasitic diseases: Secondary | ICD-10-CM

## 2016-12-02 LAB — COMPREHENSIVE METABOLIC PANEL
ALBUMIN: 3.6 g/dL (ref 3.5–5.0)
ALT: 42 U/L (ref 17–63)
ANION GAP: 14 (ref 5–15)
AST: 42 U/L — ABNORMAL HIGH (ref 15–41)
Alkaline Phosphatase: 73 U/L (ref 38–126)
BUN: 42 mg/dL — ABNORMAL HIGH (ref 6–20)
CO2: 13 mmol/L — AB (ref 22–32)
Calcium: 8.9 mg/dL (ref 8.9–10.3)
Chloride: 101 mmol/L (ref 101–111)
Creatinine, Ser: 6.18 mg/dL — ABNORMAL HIGH (ref 0.61–1.24)
GFR calc Af Amer: 10 mL/min — ABNORMAL LOW (ref >60)
GFR calc non Af Amer: 8 mL/min — ABNORMAL LOW (ref >60)
GLUCOSE: 111 mg/dL — AB (ref 65–99)
Potassium: 3.9 mmol/L (ref 3.5–5.1)
SODIUM: 128 mmol/L — AB (ref 135–145)
TOTAL PROTEIN: 8.2 g/dL — AB (ref 6.5–8.1)
Total Bilirubin: 1 mg/dL (ref 0.3–1.2)

## 2016-12-02 LAB — CBC
HCT: 42.5 % (ref 39.0–52.0)
Hemoglobin: 15 g/dL (ref 13.0–17.0)
MCH: 33.1 pg (ref 26.0–34.0)
MCHC: 35.3 g/dL (ref 30.0–36.0)
MCV: 93.8 fL (ref 78.0–100.0)
PLATELETS: 246 10*3/uL (ref 150–400)
RBC: 4.53 MIL/uL (ref 4.22–5.81)
RDW: 13.3 % (ref 11.5–15.5)
WBC: 18.5 10*3/uL — AB (ref 4.0–10.5)

## 2016-12-02 LAB — LIPASE, BLOOD: Lipase: 169 U/L — ABNORMAL HIGH (ref 11–51)

## 2016-12-02 MED ORDER — SODIUM CHLORIDE 0.9 % IV BOLUS (SEPSIS)
2000.0000 mL | Freq: Once | INTRAVENOUS | Status: AC
  Administered 2016-12-02: 2000 mL via INTRAVENOUS

## 2016-12-02 NOTE — ED Notes (Signed)
Unable to get oral temp on pt. Axilary temp was 96.1. Monique - RN aware.

## 2016-12-02 NOTE — ED Triage Notes (Signed)
Pt states he was in hospital a week ago for parasite in stool; pt states diarrhea has returned; pt states 5 episodes today and has taken Imodium with no decrease; Pt presents Hypotensive at triage Pt denies pain on arrival. Pt appears weak at triage

## 2016-12-02 NOTE — ED Notes (Signed)
Pt has consistent low bp and feeling of dizziness; pt is pale and diaphoretic at triage; IV started with fluids; pt transferred to room; last BP 56/39

## 2016-12-03 ENCOUNTER — Emergency Department (HOSPITAL_COMMUNITY): Payer: Medicare Other

## 2016-12-03 ENCOUNTER — Encounter (HOSPITAL_COMMUNITY): Payer: Self-pay | Admitting: Infectious Diseases

## 2016-12-03 DIAGNOSIS — E782 Mixed hyperlipidemia: Secondary | ICD-10-CM

## 2016-12-03 DIAGNOSIS — I10 Essential (primary) hypertension: Secondary | ICD-10-CM

## 2016-12-03 DIAGNOSIS — I4581 Long QT syndrome: Secondary | ICD-10-CM | POA: Diagnosis present

## 2016-12-03 DIAGNOSIS — A419 Sepsis, unspecified organism: Secondary | ICD-10-CM

## 2016-12-03 DIAGNOSIS — E861 Hypovolemia: Secondary | ICD-10-CM | POA: Diagnosis present

## 2016-12-03 DIAGNOSIS — F419 Anxiety disorder, unspecified: Secondary | ICD-10-CM | POA: Diagnosis present

## 2016-12-03 DIAGNOSIS — I959 Hypotension, unspecified: Secondary | ICD-10-CM

## 2016-12-03 DIAGNOSIS — E871 Hypo-osmolality and hyponatremia: Secondary | ICD-10-CM | POA: Diagnosis present

## 2016-12-03 DIAGNOSIS — R6520 Severe sepsis without septic shock: Secondary | ICD-10-CM

## 2016-12-03 DIAGNOSIS — N179 Acute kidney failure, unspecified: Secondary | ICD-10-CM

## 2016-12-03 DIAGNOSIS — Z951 Presence of aortocoronary bypass graft: Secondary | ICD-10-CM

## 2016-12-03 DIAGNOSIS — E869 Volume depletion, unspecified: Secondary | ICD-10-CM | POA: Diagnosis not present

## 2016-12-03 DIAGNOSIS — Z7902 Long term (current) use of antithrombotics/antiplatelets: Secondary | ICD-10-CM | POA: Diagnosis not present

## 2016-12-03 DIAGNOSIS — I700 Atherosclerosis of aorta: Secondary | ICD-10-CM | POA: Diagnosis present

## 2016-12-03 DIAGNOSIS — Z7982 Long term (current) use of aspirin: Secondary | ICD-10-CM | POA: Diagnosis not present

## 2016-12-03 DIAGNOSIS — E872 Acidosis, unspecified: Secondary | ICD-10-CM | POA: Diagnosis present

## 2016-12-03 DIAGNOSIS — Z87891 Personal history of nicotine dependence: Secondary | ICD-10-CM

## 2016-12-03 DIAGNOSIS — F329 Major depressive disorder, single episode, unspecified: Secondary | ICD-10-CM | POA: Diagnosis present

## 2016-12-03 DIAGNOSIS — I2510 Atherosclerotic heart disease of native coronary artery without angina pectoris: Secondary | ICD-10-CM

## 2016-12-03 DIAGNOSIS — Z8249 Family history of ischemic heart disease and other diseases of the circulatory system: Secondary | ICD-10-CM | POA: Diagnosis not present

## 2016-12-03 DIAGNOSIS — Z8619 Personal history of other infectious and parasitic diseases: Secondary | ICD-10-CM

## 2016-12-03 DIAGNOSIS — E8809 Other disorders of plasma-protein metabolism, not elsewhere classified: Secondary | ICD-10-CM | POA: Diagnosis present

## 2016-12-03 DIAGNOSIS — K219 Gastro-esophageal reflux disease without esophagitis: Secondary | ICD-10-CM | POA: Diagnosis present

## 2016-12-03 DIAGNOSIS — K529 Noninfective gastroenteritis and colitis, unspecified: Secondary | ICD-10-CM

## 2016-12-03 DIAGNOSIS — A072 Cryptosporidiosis: Secondary | ICD-10-CM | POA: Diagnosis present

## 2016-12-03 DIAGNOSIS — I252 Old myocardial infarction: Secondary | ICD-10-CM | POA: Diagnosis not present

## 2016-12-03 DIAGNOSIS — E860 Dehydration: Secondary | ICD-10-CM | POA: Diagnosis present

## 2016-12-03 DIAGNOSIS — E876 Hypokalemia: Secondary | ICD-10-CM | POA: Diagnosis not present

## 2016-12-03 DIAGNOSIS — Z955 Presence of coronary angioplasty implant and graft: Secondary | ICD-10-CM | POA: Diagnosis not present

## 2016-12-03 HISTORY — DX: Severe sepsis without septic shock: A41.9

## 2016-12-03 LAB — GASTROINTESTINAL PANEL BY PCR, STOOL (REPLACES STOOL CULTURE)
Adenovirus F40/41: NOT DETECTED
Astrovirus: NOT DETECTED
CAMPYLOBACTER SPECIES: NOT DETECTED
CRYPTOSPORIDIUM: DETECTED — AB
Cyclospora cayetanensis: NOT DETECTED
Entamoeba histolytica: NOT DETECTED
Enteroaggregative E coli (EAEC): NOT DETECTED
Enteropathogenic E coli (EPEC): NOT DETECTED
Enterotoxigenic E coli (ETEC): NOT DETECTED
GIARDIA LAMBLIA: NOT DETECTED
Norovirus GI/GII: NOT DETECTED
PLESIMONAS SHIGELLOIDES: NOT DETECTED
ROTAVIRUS A: NOT DETECTED
SALMONELLA SPECIES: NOT DETECTED
SAPOVIRUS (I, II, IV, AND V): NOT DETECTED
SHIGELLA/ENTEROINVASIVE E COLI (EIEC): NOT DETECTED
Shiga like toxin producing E coli (STEC): NOT DETECTED
Vibrio cholerae: NOT DETECTED
Vibrio species: NOT DETECTED
YERSINIA ENTEROCOLITICA: NOT DETECTED

## 2016-12-03 LAB — CBC
HEMATOCRIT: 37.5 % — AB (ref 39.0–52.0)
HEMOGLOBIN: 12.6 g/dL — AB (ref 13.0–17.0)
MCH: 31.7 pg (ref 26.0–34.0)
MCHC: 33.6 g/dL (ref 30.0–36.0)
MCV: 94.2 fL (ref 78.0–100.0)
Platelets: 175 10*3/uL (ref 150–400)
RBC: 3.98 MIL/uL — ABNORMAL LOW (ref 4.22–5.81)
RDW: 13.5 % (ref 11.5–15.5)
WBC: 11.4 10*3/uL — AB (ref 4.0–10.5)

## 2016-12-03 LAB — COMPREHENSIVE METABOLIC PANEL
ALT: 33 U/L (ref 17–63)
ANION GAP: 12 (ref 5–15)
AST: 30 U/L (ref 15–41)
Albumin: 2.9 g/dL — ABNORMAL LOW (ref 3.5–5.0)
Alkaline Phosphatase: 57 U/L (ref 38–126)
BILIRUBIN TOTAL: 0.9 mg/dL (ref 0.3–1.2)
BUN: 42 mg/dL — ABNORMAL HIGH (ref 6–20)
CO2: 12 mmol/L — ABNORMAL LOW (ref 22–32)
Calcium: 7.6 mg/dL — ABNORMAL LOW (ref 8.9–10.3)
Chloride: 105 mmol/L (ref 101–111)
Creatinine, Ser: 4.91 mg/dL — ABNORMAL HIGH (ref 0.61–1.24)
GFR calc Af Amer: 13 mL/min — ABNORMAL LOW (ref >60)
GFR, EST NON AFRICAN AMERICAN: 11 mL/min — AB (ref >60)
Glucose, Bld: 100 mg/dL — ABNORMAL HIGH (ref 65–99)
POTASSIUM: 3.8 mmol/L (ref 3.5–5.1)
Sodium: 129 mmol/L — ABNORMAL LOW (ref 135–145)
TOTAL PROTEIN: 6.8 g/dL (ref 6.5–8.1)

## 2016-12-03 LAB — I-STAT CG4 LACTIC ACID, ED: LACTIC ACID, VENOUS: 1.01 mmol/L (ref 0.5–1.9)

## 2016-12-03 LAB — C DIFFICILE QUICK SCREEN W PCR REFLEX
C DIFFICILE (CDIFF) INTERP: NOT DETECTED
C DIFFICILE (CDIFF) TOXIN: NEGATIVE
C Diff antigen: NEGATIVE

## 2016-12-03 LAB — HIV ANTIBODY (ROUTINE TESTING W REFLEX): HIV SCREEN 4TH GENERATION: NONREACTIVE

## 2016-12-03 LAB — TROPONIN I

## 2016-12-03 LAB — GLUCOSE, CAPILLARY: Glucose-Capillary: 78 mg/dL (ref 65–99)

## 2016-12-03 MED ORDER — ENSURE ENLIVE PO LIQD
237.0000 mL | Freq: Two times a day (BID) | ORAL | Status: DC
  Administered 2016-12-03 – 2016-12-04 (×3): 237 mL via ORAL

## 2016-12-03 MED ORDER — SODIUM CHLORIDE 0.9% FLUSH
3.0000 mL | Freq: Two times a day (BID) | INTRAVENOUS | Status: DC
  Administered 2016-12-03 – 2016-12-07 (×5): 3 mL via INTRAVENOUS

## 2016-12-03 MED ORDER — HEPARIN SODIUM (PORCINE) 5000 UNIT/ML IJ SOLN
5000.0000 [IU] | Freq: Three times a day (TID) | INTRAMUSCULAR | Status: DC
  Administered 2016-12-03 – 2016-12-07 (×13): 5000 [IU] via SUBCUTANEOUS
  Filled 2016-12-03 (×13): qty 1

## 2016-12-03 MED ORDER — BUPROPION HCL ER (XL) 150 MG PO TB24
300.0000 mg | ORAL_TABLET | Freq: Every day | ORAL | Status: DC
  Administered 2016-12-03 – 2016-12-07 (×5): 300 mg via ORAL
  Filled 2016-12-03 (×5): qty 2

## 2016-12-03 MED ORDER — FLUTICASONE PROPIONATE 50 MCG/ACT NA SUSP
1.0000 | Freq: Every day | NASAL | Status: DC
  Filled 2016-12-03: qty 16

## 2016-12-03 MED ORDER — ACETAMINOPHEN 650 MG RE SUPP: 650.0000 mg | Freq: Four times a day (QID) | RECTAL | Status: DC | PRN

## 2016-12-03 MED ORDER — SODIUM CHLORIDE 0.9 % IV BOLUS (SEPSIS)
1000.0000 mL | Freq: Once | INTRAVENOUS | Status: AC
  Administered 2016-12-03: 1000 mL via INTRAVENOUS

## 2016-12-03 MED ORDER — PANTOPRAZOLE SODIUM 40 MG PO TBEC
40.0000 mg | DELAYED_RELEASE_TABLET | Freq: Every day | ORAL | Status: DC
  Administered 2016-12-03 – 2016-12-07 (×5): 40 mg via ORAL
  Filled 2016-12-03 (×5): qty 1

## 2016-12-03 MED ORDER — SODIUM CHLORIDE 0.9 % IV BOLUS (SEPSIS)
1500.0000 mL | Freq: Once | INTRAVENOUS | Status: AC
  Administered 2016-12-03: 1500 mL via INTRAVENOUS

## 2016-12-03 MED ORDER — ATORVASTATIN CALCIUM 40 MG PO TABS
40.0000 mg | ORAL_TABLET | Freq: Every day | ORAL | Status: DC
  Administered 2016-12-03 – 2016-12-07 (×5): 40 mg via ORAL
  Filled 2016-12-03 (×5): qty 1

## 2016-12-03 MED ORDER — PIPERACILLIN-TAZOBACTAM IN DEX 2-0.25 GM/50ML IV SOLN
2.2500 g | Freq: Three times a day (TID) | INTRAVENOUS | Status: DC
Start: 2016-12-03 — End: 2016-12-03
  Filled 2016-12-03: qty 50

## 2016-12-03 MED ORDER — PROMETHAZINE HCL 25 MG PO TABS
12.5000 mg | ORAL_TABLET | Freq: Four times a day (QID) | ORAL | Status: DC | PRN
  Administered 2016-12-04: 12.5 mg via ORAL
  Filled 2016-12-03: qty 1

## 2016-12-03 MED ORDER — NEFAZODONE HCL 150 MG PO TABS
300.0000 mg | ORAL_TABLET | Freq: Every day | ORAL | Status: DC
  Administered 2016-12-03 – 2016-12-07 (×5): 300 mg via ORAL
  Filled 2016-12-03 (×5): qty 2

## 2016-12-03 MED ORDER — ASPIRIN 81 MG PO CHEW
81.0000 mg | CHEWABLE_TABLET | Freq: Every evening | ORAL | Status: DC
  Administered 2016-12-03 – 2016-12-06 (×4): 81 mg via ORAL
  Filled 2016-12-03 (×4): qty 1

## 2016-12-03 MED ORDER — CLOPIDOGREL BISULFATE 75 MG PO TABS
75.0000 mg | ORAL_TABLET | Freq: Every day | ORAL | Status: DC
  Administered 2016-12-03 – 2016-12-07 (×5): 75 mg via ORAL
  Filled 2016-12-03 (×5): qty 1

## 2016-12-03 MED ORDER — PIPERACILLIN-TAZOBACTAM 3.375 G IVPB 30 MIN
3.3750 g | Freq: Once | INTRAVENOUS | Status: AC
  Administered 2016-12-03: 3.375 g via INTRAVENOUS
  Filled 2016-12-03: qty 50

## 2016-12-03 MED ORDER — FLUTICASONE PROPIONATE 50 MCG/ACT NA SUSP
1.0000 | Freq: Every day | NASAL | Status: DC
  Administered 2016-12-03 – 2016-12-06 (×5): 1 via NASAL
  Filled 2016-12-03: qty 16

## 2016-12-03 MED ORDER — ACETAMINOPHEN 325 MG PO TABS: 650.0000 mg | ORAL_TABLET | Freq: Four times a day (QID) | ORAL | Status: DC | PRN

## 2016-12-03 MED ORDER — SODIUM CHLORIDE 0.9 % IV SOLN
INTRAVENOUS | Status: DC
  Administered 2016-12-03: 250 mL/h via INTRAVENOUS
  Administered 2016-12-03: 1000 mL via INTRAVENOUS
  Administered 2016-12-04: 250 mL/h via INTRAVENOUS
  Administered 2016-12-04: 07:00:00 via INTRAVENOUS

## 2016-12-03 NOTE — Care Management Note (Addendum)
Case Management Note  Patient Details  Name: Gregory Hansen MRN: [id number] Date of Birth: [birth year]  Subjective/Objective:   From home with his son, pta indep, Presents with sepsis, acidosis, acute renal failure, hx of cad,depression, anxiety. He states he goes to the urgent care on [address], but would like to have follow up at [facility].  NCM will scheduled a follow up apt for patient.                  Action/Plan: NCM will follow for dc needs.   Expected Discharge Date:                  Expected Discharge Plan:     In-House Referral:     Discharge planning Services  CM Consult  Post Acute Care Choice:    Choice offered to:     DME Arranged:    DME Agency:     HH Arranged:    HH Agency:     Status of Service:  In process, will continue to follow  If discussed at Long Length of Stay Meetings, dates discussed:    Additional Comments:  Zenon Mayo, RN 12/03/2016, 2:32 PM

## 2016-12-03 NOTE — Plan of Care (Signed)
Problem: Fluid Volume: Goal: Hemodynamic stability will improve Outcome: Progressing Pt BP, HR, cap refill, skin turgor WDL; NS being given at 125  Problem: Physical Regulation: Goal: Signs and symptoms of infection will decrease Outcome: Progressing Pt ordered Zosyn; running now   Problem: Respiratory: Goal: Ability to maintain adequate ventilation will improve Outcome: Completed/Met Date Met: 12/03/16 Will keep monitoring; pt shows no signs of trouble with ventilation  Problem: Physical Regulation: Goal: Will remain free from infection Outcome: Progressing Zosyn running  Problem: Activity: Goal: Risk for activity intolerance will decrease Outcome: Progressing Pt says he doesn't feel dizzy anymore; pt still feels weak but states it isn't as bad as when he came in to the ED; pt remains free of SOB

## 2016-12-03 NOTE — ED Notes (Signed)
Pt voiding in bedside commode with stool, unable to collect specimen. Will attempt recollect.

## 2016-12-03 NOTE — Progress Notes (Signed)
Family Medicine Teaching Service Daily Progress Note Intern Pager: [phone]  Patient name: Gregory Hansen Medical record number: [id number] Date of birth: 04/20/1950 Age: 66 y.o. Gender: male  Primary Care Provider: Patient, No Pcp Per Consultants: Pharmacy Code Status: Full  Pt Overview and Major Events to Date:  9/25 - admitted for intractable diarrhea  Assessment and Plan: Gregory Hansen is a 66 y.o. male presenting with intractable diarrhea. PMH is significant for HTN, CAD status post stent 2 and quadruple CABG, depression, anxiety, GERD and ankylosing spondylitis.   Sepsis 2/2 Diarrhea with associated nausea/vomiting: Initially meeting sepsis criteria with T96.1, RR 22, WBC 18.5, and suspected source with diarrheal illness. Vitals stabilized with 3.5L NS in the ED. Systolic pressure in the 42P on presentation. Since, his BP has stabilized systolic 536R. Patient was recently seen and treated in the hospital for acute diarrhea and found to have Cryptosporidium. Patient was immunocompetent and responded well to rehydration and symptomatic treatment and was discharged on 9/16. Etiology is continued Cryptosporidium vs gastritis (via new viral pathogen) vs colitis. Cryptosporidium is most likely etiology as can relapse after initial treatment with symptoms persisting for 10-14 days after initial incubation but further work up is merited. C diff negative. No vomiting since yesterday afternoon. S/p 3.5L bolus in the ED. - monitor on telemetry - IVF NS @175mL /hr - full thickened liquids - GI pathogen panel - Phenergan PRN for nausea - BCx pending - Started on empiric Zosyn dosed per pharmacy for sepsis coverage. Most likely Gram negative sepsis. If fails to improve with zosyn can consider adding G+ coverage. - consider starting anti-protozoal   Acidosis: NAGMA due to profuse diarrhea and vomiting. BMP 128/3.9/101/13/42/6.18<111 Anticipate acidosis will improve as diarrhea subsides and volume  status improves.  - monitor with BMP - continue IV hydration NS 144ml/hr  Acute renal failure: Cr on admission 6.18, baseline Cr of ~0.9. Highly likely this is prerenal as patient was dehydrated on exam and has known cause for being volume depleted. Patient presented with decreased urination, has urinated since admitted, total UOP 75ml. Net +3.4L. No signs of volume overload on exam. Cr 9/25 4.91. Weight down 8 lbs since admission. - Continue aggressive IV hydration NS 127ml/hr - Monitor with BMP daily - monitor strict I/O, daily weights  Borderline Prolonged QT: stable Qtc noted to be 482 on admission. - Holding agents that could prolong his QT, such as Zofran - on telemetry  History of CAD: s/p stent 2 in 2002 and 2006 and quadruple CABG in 2015. No chest pain and EKG negative for ACS.  - Holding Metoprolol 50 mg twice a day because of marked hypotension on admission (65/41). BP improved after IV hydration. - Continue home aspirin, Plavix and atorvastatin. - add back metop once BP has stabilized  Hyperglycemia: BMP glucose 111 on admission. A1c 5.2. CBG 78 this am 9/25. -continue to follow per PCP outpatient  Depression/anxiety: Stable. Denies SI/HI -Continue home Wellbutrin and nefazodone  FEN/GI: thickened liquids Prophylaxis: Heparin  Disposition: continue inpatient management with IV hydration, diarrhea workup  Subjective:  Pt feels better today, no further episodes of vomiting since before admission, believes it may be due to use of Lomotil. Able to get up out of bed and use the bathroom, encouraged assistance due to low BPs.   Objective: Temp:  [96.1 F (35.6 C)-98.2 F (36.8 C)] 98.2 F (36.8 C) (09/25 0830) Pulse Rate:  [56-135] 73 (09/25 0830) Resp:  [15-23] 20 (09/25 0830) BP: (65-110)/(41-71) 105/45 (09/25  0830) SpO2:  [95 %-100 %] 99 % (09/25 0830) Weight:  [242 lb 15.2 oz (110.2 kg)-250 lb (113.4 kg)] 242 lb 15.2 oz (110.2 kg) (09/25 0400)  Physical  Exam: General: appears comfortable lying in bed, in NAD Cardiovascular: RRR, no murmur Respiratory: CTA bilaterally, no wheezes/crackes Abdomen: non tender to palpation, hyperactive BS, no hepatosplenomegaly Extremities: WWP, no LE edema, dorsal pedal pulses appreciated  Laboratory:  Recent Labs Lab 12/02/16 2300 12/03/16 0615  WBC 18.5* 11.4*  HGB 15.0 12.6*  HCT 42.5 37.5*  PLT 246 175    Recent Labs Lab 12/02/16 2300 12/03/16 0615  NA 128* 129*  K 3.9 3.8  CL 101 105  CO2 13* 12*  BUN 42* 42*  CREATININE 6.18* 4.91*  CALCIUM 8.9 7.6*  PROT 8.2* 6.8  BILITOT 1.0 0.9  ALKPHOS 73 57  ALT 42 33  AST 42* 30  GLUCOSE 111* 100*   Imaging/Diagnostic Tests:  CXR 9/25 FINDINGS: Unchanged cardiomegaly. Status post median sternotomy. No focal airspace consolidation or pulmonary edema. No pneumothorax or sizable pleural effusion. IMPRESSION: Unchanged cardiomegaly without active cardiopulmonary disease.  [name], DO 12/03/2016, 9:38 AM PGY-1, [place] Intern pager: [phone], text pages welcome

## 2016-12-03 NOTE — H&P (Signed)
[facility] Admission History and Physical Service Pager: [phone]  Patient name: Gregory Hansen Medical record number: [id number] Date of birth: September 13, 1950 Age: 66 y.o. Gender: male  Primary Care Provider: Patient, No Pcp Per Consultants: Pharmacy Code Status: FULL  Chief Complaint:  Intractable diarrhea w/ associated nausea and vomiting, found to have acute kidney failure  Assessment and Plan: Gregory Hansen is a 66 y.o. male presenting with intractable diarrhea. PMH is significant for HTN, CAD status post stent 2 and quadruple CABG, depression, anxiety, GERD and ankylosing spondylitis.   Sepsis 2/2 Diarrhea with associated nausea/vomiting: Initially meeting sepsis criteria with T96.1, RR 22, WBC 18.5, and suspected source with diarrheal illness. Vitals stabilized with 3.5L NS in the ED. Systolic pressure in the 58N on presentation. Since, his BP has stabilized systolic above 277/82. Patient was recently seen and treated in the hospital for acute diarrhea and found to have Cryptosporidium. Patient was immunocompetent and responded well to rehydration and symptomatic treatment and was discharged on 9/16. Etiology is continued Cryptosporidium vs gastritis (via new viral pathogen) vs colitis. Cryptosporidium is most likely but further work up is merited. He received 3.5L bolus in the ED due to  - Admit to FPTS stepdown, attending Dr. McDiarmid - monitor on telemetry - IVF NS @125mL /hr - GI pathogen panel - C. Diff panel ordered - Phenergan PRN for nausea - BCx pending - Started on empiric Zosyn dosed per pharmacy for sepsis coverage. Most likely Gram negative sepsis. If fails to improve with zosyn can consider adding G+ coverage.  Acidosis: NAGMA due to profuse diarrhea and vomiting. BMP 128/3.9/101/13/42/6.18<111 Anticipate acidosis will improve as diarrhea subsides and volume status improves. - monitor with BMP - continue IV hydration  Acute renal failure:  Cr on admission 6.18 from  a baseline Cr of 0.9. Highly likely this is prerenal as patient is dehydrated on exam and has known cause for being volume depleted. Patient last urinated at 9PM about 5 hours before examined in ED. However, no signs of volume overload on exam. - Continue aggressive IV hydration - Monitor with BMP daily - monitor strict I/O, daily weights  Borderline Prolonged QT: stable Qtc noted to be 482 on admission. - Holding agents that could prolong his QT, such as Zofran - on telemetry  History of CAD s/p stent 2 in 2002 and 2006 and quadruple CABG in 2015. No chest pain and EKG negative for ACS.  - Holding Metoprolol 50 mg twice a day because of marked hypotension on admission (65/41) - Continue home aspirin, Plavix and atorvastatin. - add back metop once BP has stabilized  Hyperglycemia: BMP glucose 111 on admission. A1c 5.2. -continue to follow per PCP outpatient  Depression/anxiety: Stable. Denies SI/HI -Continue home Wellbutrin and nefazodone  FEN/GI: NPO, sips with meds Prophylaxis: Heparin  Disposition: watcher, admit to telemetry  History of Present Illness:  Gregory Hansen is a 66 y.o. male presenting with continued diarrhea that has gotten progressively worse since leaving the hospital on 9/16. He states a few days after he was discharged he felt better, he still had diarrhea but it was only a couple of times per day (1-3) and it had started to have some solidity. However, over the past 2-3 days his diarrhea became worse, increased in frequency (8-10 per day) and became watery and had a yellow-green color. He denies any black tarry stools or blood in his stools. He also began having diffuse cramping abdominal pain that was intermittent and  would be rated as high as an 8/10. He has also had several episodes of nausea and vomiting over the past few days that seems to be associated with eating. He denies any blood and describes the vomit as greenish-yellow. He had  an episode today where he got up to take a shower he felt extremely dizzy and had been experiencing fatigue on exertion over the past few days. He states "I could tell I was behind on my fluids".  He has had decreased appetite and had been urinating regularly (3-4 times per day) until this morning. He last urinated around 9pm today (9/24) and has not gone since.  He denies fevers, any new sick contacts, any chest pain, SOB, difficulty breathing, pain or burning on urination, no blood in the urine.   Review Of Systems: Per HPI with the following additions: None. See above.  ROS  Patient Active Problem List   Diagnosis Date Noted  . Sepsis ([place]) 12/03/2016  . AKI (acute kidney injury) ([place])   . Dehydration   . Diarrhea 11/22/2016  . Head injury 06/30/2015  . CHI (closed head injury) 06/30/2015  . S/P CABG x 4 10/29/2013  . HYPERLIPIDEMIA-MIXED 06/15/2008  . DEPRESSION 06/15/2008  . HYPERTENSION, BENIGN 06/15/2008  . CAD (coronary artery disease) 06/15/2008  . GERD 06/15/2008  . SPONDYLITIS, ANKYLOSING 06/15/2008    Past Medical History: Past Medical History:  Diagnosis Date  . Anginal pain ([place])   . Anxiety   . Arthritis    SPINE   . Arthritis   . Coronary artery disease    s/p multiple percutaneous interventions, PCI 202 for DMI and DES distal RCA and CFX-OM 2006  . Coronary artery disease   . Depression   . GERD (gastroesophageal reflux disease)   . Hyperlipidemia    mixed  . Hyperlipidemia   . Hypertension   . MI (myocardial infarction) ([place])   . Myocardial infarction ([place])   . Spondylitis, ankylosing ([place])     Past Surgical History: Past Surgical History:  Procedure Laterality Date  . CARDIAC CATHETERIZATION     stent RCA June3, 2002, Stent OM/PTCA circumflex August 13, 2000  . CORONARY ANGIOPLASTY WITH STENT PLACEMENT     first one in 2006; had another place 3 months ago (August 2013)  . CORONARY ANGIOPLASTY WITH STENT PLACEMENT  03/26/2013   RCA           DR  COOPER  . CORONARY ANGIOPLASTY WITH STENT PLACEMENT    . CORONARY ARTERY BYPASS GRAFT N/A 10/29/2013   Procedure: CORONARY ARTERY BYPASS GRAFTING x 4 (LIMA-LAD, SVG-OM, SVG-PD-PL) ENDOSCOPIC VEIN HARVEST RIGHT THIGH;  Surgeon: Gaye Pollack, MD;  Location: [facility] OR;  Service: Open Heart Surgery;  Laterality: N/A;  . CORONARY ARTERY BYPASS GRAFT    . FINGER SURGERY     5  TH     DIGIT RIGHT HAND  . INTRAOPERATIVE TRANSESOPHAGEAL ECHOCARDIOGRAM N/A 10/29/2013   Procedure: INTRAOPERATIVE TRANSESOPHAGEAL ECHOCARDIOGRAM;  Surgeon: Gaye Pollack, MD;  Location[facility] OR;  Service: Open Heart Surgery;  Laterality: N/A;  . LEFT HEART CATHETERIZATION WITH CORONARY ANGIOGRAM N/A 10/01/2011   Procedure: LEFT HEART CATHETERIZATION WITH CORONARY ANGIOGRAM;  Surgeon: [name], MD;  Location[facility] C[facility] LAB;  Service: Cardiovascular;  Laterality: N/A;  . LEFT HEART CATHETERIZATION WITH CORONARY ANGIOGRAM N/A 03/26/2013   Procedure: LEFT HEART CATHETERIZATION WITH CORONARY ANGIOGRAM;  Surgeon: Blane Ohara, MD;  Location: [facility] CATH LAB;  Service: Cardiovascular;  Laterality: N/A;  .  LEFT HEART CATHETERIZATION WITH CORONARY ANGIOGRAM N/A 10/25/2013   Procedure: LEFT HEART CATHETERIZATION WITH CORONARY ANGIOGRAM;  Surgeon: Blane Ohara, MD;  Location: [facility] CATH LAB;  Service: Cardiovascular;  Laterality: N/A;    Social History: Social History  Substance Use Topics  . Smoking status: Former Smoker    Packs/day: 2.00    Years: 10.00    Types: Cigarettes    Quit date: 06/30/1979  . Smokeless tobacco: Never Used  . Alcohol use 13.2 oz/week    14 Glasses of wine, 2 Shots of liquor per week     Comment: glass of wine at dinner everyday.   Additional social history: none Please also refer to relevant sections of EMR.  Family History: Family History  Problem Relation Age of Onset  . Breast cancer Mother   . Heart disease Father   . Heart disease Brother   . Leukemia Maternal Grandmother   . Heart disease  Maternal Grandfather   . Heart disease Unknown        positive for cardiac disease and both brothers have had cardiac events  . Heart attack Brother     Allergies and Medications: No Known Allergies No current facility-administered medications on file prior to encounter.    Current Outpatient Prescriptions on File Prior to Encounter  Medication Sig Dispense Refill  . aspirin 81 MG tablet Take 81 mg by mouth every evening.     Marland Kitchen atorvastatin (LIPITOR) 80 MG tablet TAKE 40MG  BY MOUTH ONCE  DAILY 45 tablet 3  . buPROPion (WELLBUTRIN XL) 300 MG 24 hr tablet Take 300 mg by mouth daily.    . cetirizine (KLS ALLER-TEC) 10 MG tablet Take 10 mg by mouth daily.    . clopidogrel (PLAVIX) 75 MG tablet Take 75 mg by mouth daily.    Marland Kitchen desonide (DESOWEN) 0.05 % cream Apply 1 application topically daily as needed (to face).    . fluticasone (FLONASE) 50 MCG/ACT nasal spray Place 2 sprays into both nostrils daily.    . metoprolol succinate (TOPROL-XL) 100 MG 24 hr tablet Take 1 tablet (100 mg total) by mouth daily. Take with or immediately following a meal. 90 tablet 3  . nefazodone (SERZONE) 200 MG tablet Take 300 mg by mouth daily.     . nitroGLYCERIN (NITROSTAT) 0.4 MG SL tablet Place 0.4 mg under the tongue every 5 (five) minutes as needed for chest pain. Up to 3 doses    . pantoprazole (PROTONIX) 40 MG tablet TAKE 1 TABLET BY MOUTH  DAILY 90 tablet 3  . prednisoLONE acetate (PRED FORTE) 1 % ophthalmic suspension Place 1 drop into both eyes daily as needed.    . ramipril (ALTACE) 10 MG capsule TAKE 1 CAPSULE BY MOUTH TWO TIMES DAILY 180 capsule 3  . vitamin B-12 (CYANOCOBALAMIN) 1000 MCG tablet Take 1,000 mcg by mouth daily.      Objective: BP 104/70   Pulse 69   Temp (!) 97.2 F (36.2 C) (Oral)   Resp 20   Ht 6\' 2"  (1.88 m)   Wt 250 lb (113.4 kg)   SpO2 100%   BMI 32.10 kg/m  Exam: General: Alert and Oriented x 3, NAD, not acutely toxic-appearing but pale and cool Eyes: EOMI,  PERRLA Cardiovascular: RRR, normal S1, S2, no murmurs, tachycardic when sits up for exam, improves with lying flat. Cap refill >2s, extremities are cool to the touch Respiratory: CTAB, no wheezing, no rales, good effort, comfortgable work of breathing Gastrointestinal: hyperactive bowel sounds, soft,  non-tender, non-distended, no hepatomegaly MSK: FROM in all extremities Ext: cap refill >2 secs, no cyanosis, no edema. Derm: somewhat cool in the lower extremities bilaterally, dry, intact, no rash, Neuro: CN II-XII  intact, 5/5 strength upper and lower extremity strength bilaterally, gross sensation intact  Labs and Imaging: CBC BMET   Recent Labs Lab 12/02/16 2300  WBC 18.5*  HGB 15.0  HCT 42.5  PLT 246    Recent Labs Lab 12/02/16 2300  NA 128*  K 3.9  CL 101  CO2 13*  BUN 42*  CREATININE 6.18*  GLUCOSE 111*  CALCIUM 8.9     9/24 - EKG: NSR, RBBB, borderline prolonged QT; RBBB is unchanged from previous EKG on 11/22/2016 9/25 - CXR: IMPRESSION: Unchanged cardiomegaly without active cardiopulmonary disease.  Harolyn Rutherford, DO PGY-1, [place] Intern pager: [phone], text pages welcome  I have separately seen and examined the patient. I have discussed the findings and exam with Dr. Garlan Fillers and agree with the above note.  My changes/additions are outlined in [place].  Everrett Coombe, MD PGY-2 [facility]

## 2016-12-03 NOTE — ED Notes (Signed)
Attempted report 

## 2016-12-03 NOTE — ED Notes (Signed)
Verified with Dr. Dina Rich, no antibiotics at this time.

## 2016-12-03 NOTE — Progress Notes (Signed)
FPTS Interim Progress Note  S: [name] is resting comfortably in bed. He denies chest pain at this time and his only complaint is diarrhea that is continuing. He denies abdominal pain.  O: Cardio: No JVD, RRR, no murmurs, no gallops       Resp: CTAB, no wheezes, no crackles, rate while in the room was in 80s       Abd: soft, +bs non-tender BP (!) 88/42 (BP Location: Left Arm)   Pulse (!) 56   Temp 98.2 F (36.8 C) (Oral)   Resp (!) 21   Ht 6\' 2"  (1.88 m)   Wt 242 lb 15.2 oz (110.2 kg)   SpO2 100%   BMI 31.19 kg/m     A/P: - Stopped Zosyn, following ID recommendations - Gave 1L bolus around 9:30pm with maintenance fluids, continue to monitor his BP and HR - Trending troponin to ensure his low BP is not straining his heart - Continue supportive care for Crypto infection  Nuala Alpha, DO 12/03/2016, 9:50 PM PGY-1, [place] Medicine Service pager [phone]

## 2016-12-03 NOTE — ED Notes (Signed)
Admitting team at bedside.

## 2016-12-03 NOTE — ED Provider Notes (Signed)
[facility] DEPT Provider Note   CSN: [id number] Arrival date & time: 12/02/16  2211     History   Chief Complaint Chief Complaint  Patient presents with  . Diarrhea  . Abdominal Cramping  . Weakness    HPI AIREN STIEHL is a 66 y.o. male.  HPI  This is a 66 year old male with a history of coronary artery disease, hypertension, hyperlipidemia and recent history of diarrheal illness with positive cryptosporidium who presents with continued diarrhea. Patient reports that since discharge he just hasn't gotten any better. He reports 6-7 loose watery stools per day. He also reports occasional vomiting.  He reports that he feels lightheaded especially with position change. He has a history of high blood pressure and is taking his medications as directed. Denies any chest pain, shortness of breath. He does report some crampy abdominal pain. However, he states that that has improved "some" since hospitalization.  Chart review. Patient admitted for dehydration and diarrheal illness. Positive cryptosporidium. No antiparasitic given. He did not receive any antibodies during hospitalization.  Past Medical History:  Diagnosis Date  . Anginal pain ([place])   . Anxiety   . Arthritis    SPINE   . Arthritis   . Coronary artery disease    s/p multiple percutaneous interventions, PCI 202 for DMI and DES distal RCA and CFX-OM 2006  . Coronary artery disease   . Depression   . GERD (gastroesophageal reflux disease)   . Hyperlipidemia    mixed  . Hyperlipidemia   . Hypertension   . MI (myocardial infarction) ([place])   . Myocardial infarction ([place])   . Spondylitis, ankylosing [facility])     Patient Active Problem List   Diagnosis Date Noted  . AKI (acute kidney injury) ([place])   . Dehydration   . Diarrhea 11/22/2016  . Head injury 06/30/2015  . CHI (closed head injury) 06/30/2015  . S/P CABG x 4 10/29/2013  . HYPERLIPIDEMIA-MIXED 06/15/2008  . DEPRESSION 06/15/2008  . HYPERTENSION, BENIGN  06/15/2008  . CAD (coronary artery disease) 06/15/2008  . GERD 06/15/2008  . SPONDYLITIS, ANKYLOSING 06/15/2008    Past Surgical History:  Procedure Laterality Date  . CARDIAC CATHETERIZATION     stent RCA June3, 2002, Stent OM/PTCA circumflex August 13, 2000  . CORONARY ANGIOPLASTY WITH STENT PLACEMENT     first one in 2006; had another place 3 months ago (August 2013)  . CORONARY ANGIOPLASTY WITH STENT PLACEMENT  03/26/2013   RCA           DR COOPER  . CORONARY ANGIOPLASTY WITH STENT PLACEMENT    . CORONARY ARTERY BYPASS GRAFT N/A 10/29/2013   Procedure: CORONARY ARTERY BYPASS GRAFTING x 4 (LIMA-LAD, SVG-OM, SVG-PD-PL) ENDOSCOPIC VEIN HARVEST RIGHT THIGH;  Surgeon: Gaye Pollack, MD;  Location: [facility] OR;  Service: Open Heart Surgery;  Laterality: N/A;  . CORONARY ARTERY BYPASS GRAFT    . FINGER SURGERY     5  TH     DIGIT RIGHT HAND  . INTRAOPERATIVE TRANSESOPHAGEAL ECHOCARDIOGRAM N/A 10/29/2013   Procedure: INTRAOPERATIVE TRANSESOPHAGEAL ECHOCARDIOGRAM;  Surgeon: Gaye Pollack, MD;  Location[facility] OR;  Service: Open Heart Surgery;  Laterality: N/A;  . LEFT HEART CATHETERIZATION WITH CORONARY ANGIOGRAM N/A 10/01/2011   Procedure: LEFT HEART CATHETERIZATION WITH CORONARY ANGIOGRAM;  Surgeon: [name], MD;  Location: [facility] CATH LAB;  Service: Cardiovascular;  Laterality: N/A;  . LEFT HEART CATHETERIZATION WITH CORONARY ANGIOGRAM N/A 03/26/2013   Procedure: LEFT HEART CATHETERIZATION WITH CORONARY ANGIOGRAM;  Surgeon: Blane Ohara, MD;  Location: [facility] CATH LAB;  Service: Cardiovascular;  Laterality: N/A;  . LEFT HEART CATHETERIZATION WITH CORONARY ANGIOGRAM N/A 10/25/2013   Procedure: LEFT HEART CATHETERIZATION WITH CORONARY ANGIOGRAM;  Surgeon: Blane Ohara, MD;  Location: [facility] CATH LAB;  Service: Cardiovascular;  Laterality: N/A;       Home Medications    Prior to Admission medications   Medication Sig Start Date End Date Taking? Authorizing Provider  aspirin 81 MG tablet Take 81  mg by mouth every evening.    Yes [provider]  atorvastatin (LIPITOR) 80 MG tablet TAKE 40MG  BY MOUTH ONCE  DAILY 11/24/16  Yes Sela Hilding, MD  buPROPion (WELLBUTRIN XL) 300 MG 24 hr tablet Take 300 mg by mouth daily.   Yes [provider]  cetirizine (KLS ALLER-TEC) 10 MG tablet Take 10 mg by mouth daily.   Yes [provider]  clopidogrel (PLAVIX) 75 MG tablet Take 75 mg by mouth daily.   Yes [provider]  desonide (DESOWEN) 0.05 % cream Apply 1 application topically daily as needed (to face).   Yes [provider]  fluticasone (FLONASE) 50 MCG/ACT nasal spray Place 2 sprays into both nostrils daily.   Yes [provider]  metoprolol succinate (TOPROL-XL) 100 MG 24 hr tablet Take 1 tablet (100 mg total) by mouth daily. Take with or immediately following a meal. 04/12/16  Yes [name], MD  Multiple Vitamin (MULTIVITAMIN WITH MINERALS) TABS tablet Take 1 tablet by mouth daily.   Yes [provider]  nefazodone (SERZONE) 200 MG tablet Take 300 mg by mouth daily.    Yes [provider]  nitroGLYCERIN (NITROSTAT) 0.4 MG SL tablet Place 0.4 mg under the tongue every 5 (five) minutes as needed for chest pain. Up to 3 doses   Yes [provider]  pantoprazole (PROTONIX) 40 MG tablet TAKE 1 TABLET BY MOUTH  DAILY 04/12/16  Yes [name], MD  prednisoLONE acetate (PRED FORTE) 1 % ophthalmic suspension Place 1 drop into both eyes daily as needed.   Yes [provider]  ramipril (ALTACE) 10 MG capsule TAKE 1 CAPSULE BY MOUTH TWO TIMES DAILY 05/06/16  Yes [name], MD  vitamin B-12 (CYANOCOBALAMIN) 1000 MCG tablet Take 1,000 mcg by mouth daily.   Yes [provider]    Family History Family History  Problem Relation Age of Onset  . Breast cancer Mother   . Heart disease Father   . Heart disease Brother   . Leukemia Maternal Grandmother   . Heart disease Maternal Grandfather     . Heart disease Unknown        positive for cardiac disease and both brothers have had cardiac events  . Heart attack Brother     Social History Social History  Substance Use Topics  . Smoking status: Former Smoker    Packs/day: 2.00    Years: 10.00    Types: Cigarettes    Quit date: 06/30/1979  . Smokeless tobacco: Never Used  . Alcohol use 13.2 oz/week    14 Glasses of wine, 2 Shots of liquor per week     Comment: glass of wine at dinner everyday.     Allergies   Patient has no known allergies.   Review of Systems Review of Systems  Constitutional: Negative for fever.  Respiratory: Negative for shortness of breath.   Cardiovascular: Negative for chest pain.  Gastrointestinal: Positive for abdominal pain, diarrhea, nausea and vomiting. Negative for  blood in stool.  Genitourinary: Negative for dysuria.  All other systems reviewed and are negative.    Physical Exam Updated Vital Signs BP (!) 97/57   Pulse 73   Temp (!) 97.2 F (36.2 C) (Oral)   Resp 17   Ht 6\' 2"  (1.88 m)   Wt 113.4 kg (250 lb)   SpO2 100%   BMI 32.10 kg/m   Physical Exam  Constitutional: He is oriented to person, place, and time.  Ill-appearing but nontoxic  HENT:  Head: Normocephalic and atraumatic.  Mucous membranes dry  Cardiovascular: Normal rate, regular rhythm and normal heart sounds.   No murmur heard. Pulmonary/Chest: Effort normal and breath sounds normal. No respiratory distress. He has no wheezes.  Abdominal: Soft. There is no tenderness. There is no rebound.  Hyperactive bowel sounds  Neurological: He is alert and oriented to person, place, and time.  Skin: Skin is warm and dry.  Psychiatric: He has a normal mood and affect.  Nursing note and vitals reviewed.    ED Treatments / Results  Labs (all labs ordered are listed, but only abnormal results are displayed) Labs Reviewed  LIPASE, BLOOD - Abnormal; Notable for the following:       Result Value   Lipase 169 (*)     All other components within normal limits  COMPREHENSIVE METABOLIC PANEL - Abnormal; Notable for the following:    Sodium 128 (*)    CO2 13 (*)    Glucose, Bld 111 (*)    BUN 42 (*)    Creatinine, Ser 6.18 (*)    Total Protein 8.2 (*)    AST 42 (*)    GFR calc non Af Amer 8 (*)    GFR calc Af Amer 10 (*)    All other components within normal limits  CBC - Abnormal; Notable for the following:    WBC 18.5 (*)    All other components within normal limits  CULTURE, BLOOD (ROUTINE X 2)  CULTURE, BLOOD (ROUTINE X 2)  C DIFFICILE QUICK SCREEN W PCR REFLEX  URINALYSIS, ROUTINE W REFLEX MICROSCOPIC  I-STAT CG4 LACTIC ACID, ED    EKG  EKG Interpretation  Date/Time:  Monday December 02 2016 22:48:52 EDT Ventricular Rate:  66 PR Interval:  154 QRS Duration: 114 QT Interval:  460 QTC Calculation: 482 R Axis:   100 Text Interpretation:  Normal sinus rhythm Right bundle branch block Abnormal ECG When compared with ECG of 11/22/2016, No significant change was found Confirmed by Delora Fuel ([id number]) on 12/02/2016 11:30:14 PM       Radiology Dg Chest Portable 1 View  Result Date: 12/03/2016 CLINICAL DATA:  Diarrhea EXAM: PORTABLE CHEST 1 VIEW COMPARISON:  Chest radiograph 12/01/2013 FINDINGS: Unchanged cardiomegaly. Status post median sternotomy. No focal airspace consolidation or pulmonary edema. No pneumothorax or sizable pleural effusion. IMPRESSION: Unchanged cardiomegaly without active cardiopulmonary disease. Electronically Signed   By: Ulyses Jarred M.D.   On: 12/03/2016 00:55    Procedures Procedures (including critical care time)  CRITICAL CARE Performed by: Merryl Hacker   Total critical care time: 50 minutes  Critical care time was exclusive of separately billable procedures and treating other patients.  Critical care was necessary to treat or prevent imminent or life-threatening deterioration.  Critical care was time spent personally by me on the following  activities: development of treatment plan with patient and/or surrogate as well as nursing, discussions with consultants, evaluation of patient's response to treatment, examination of patient, obtaining history  from patient or surrogate, ordering and performing treatments and interventions, ordering and review of laboratory studies, ordering and review of radiographic studies, pulse oximetry and re-evaluation of patient's condition.   Medications Ordered in ED Medications  sodium chloride 0.9 % bolus 2,000 mL (0 mLs Intravenous Stopped 12/02/16 2354)  sodium chloride 0.9 % bolus 1,500 mL (0 mLs Intravenous Stopped 12/03/16 0107)     Initial Impression / Assessment and Plan / ED Course  I have reviewed the triage vital signs and the nursing notes.  Pertinent labs & imaging results that were available during my care of the patient were reviewed by me and considered in my medical decision making (see chart for details).     Patient presents with persistent diarrhea. Noted to be hypotensive in triage. He is nontoxic-appearing but persistently hypotensive with initial blood pressures in the 70s. He reports persistent watery diarrhea since discharge. Known cryptosporidium positive. He is otherwise afebrile. Workup was initiated. Patient was given 30 mL/kg fluid silicone however, doubt overt sepsis. Likely a large component of dehydration. Lab work is notable for mildly elevated lactate. Creatinine of 6.1 which is agreed distally elevated. Patient does report he continues to urinate without difficulty. White blood count 18.5. Chest x-ray shows no evidence of pneumonia. Urinalysis pending. While at face value, labs would be suggestive of septic shock, patient is fluid responsive after 3.5 L of fluid now his blood pressure is 710 systolic. He states he feels much better. Lactate is 1.01. Broad-spectrum antibiotics held for concern for worsening of diarrheal illness given known positive results for  cryptosporidium. I have called the pharmacist to assist with dosing antiprotozoal medicines. On multiple rechecks, patient is nontoxic-appearing and feels much better after fluid resuscitation. Discussed with the family medicine service who will come and admit the patient likely to the step down unit. At this time I do not feel he needs further blood pressure support or pressors.  The patient is noted to have a MAP's <65/ SBP's <90. With the current information available to me, I don't think the patient is in septic shock. The MAP's <65/ SBP's <90, is related to an acute condition that is not due to an infection.  Acute diarrheal illness causing dehydration.  .    Final Clinical Impressions(s) / ED Diagnoses   Final diagnoses:  Hypotension, unspecified hypotension type  Diarrhea due to cryptosporidium ([place])  Acute kidney injury ([place])  Dehydration    New Prescriptions New Prescriptions   No medications on file     Merryl Hacker, MD 12/03/16 [phone]

## 2016-12-03 NOTE — Consult Note (Addendum)
[facility] for Infectious Disease    Date of Admission:  12/02/2016   Total days of antibiotics 1 zosyn               Reason for Consult: Cryptosporidum in stool assay    Referring Provider: FPTS   Assessment: Gastroenteritis Cryptosporidium AKI  Plan: 1. Consider repeating his abd films 2. Not treat crypto at this point 3. Consider stopping zosyn 4. Hydrate aggressively  Comment- Although he has recrudesced from his original illness, he is not immunocompromised. Crypto is typically a self limited illness. Will observe with hydration.  It is early, but post-infectious IBS could be a consideration as well.    Principal Problem:   Sepsis ([place]) Active Problems:   S/P CABG x 4   Acute kidney injury ([place])   Aortic atherosclerosis (HCC)   Hyponatremia   Hypoalbuminemia   Metabolic acidosis   Hypovolemia with active loss of fluid   Diarrhea due to cryptosporidium (H[facility])   Hypotension   . aspirin  81 mg Oral QPM  . atorvastatin  40 mg Oral Daily  . buPROPion  300 mg Oral Daily  . clopidogrel  75 mg Oral Daily  . feeding supplement (ENSURE ENLIVE)  237 mL Oral BID BM  . fluticasone  1 spray Each Nare QHS  . heparin  5,000 Units Subcutaneous Q8H  . nefazodone  300 mg Oral Daily  . pantoprazole  40 mg Oral Daily  . sodium chloride flush  3 mL Intravenous Q12H    HPI: Gregory Hansen is a 66 y.o. male with hx of CABG 2006, ankylosing spondylitis, hospitalization from 9-14 to 93-16. He had diarrhea and emesis and was treated with supportive care. He was d/c home with dx of viral gastroenteritis.  Her returns today with worsening diarrhea since that time (8-10 BM/day) and n/v. He also has had, decreased urine output,  fatigue and dizziness.  He was dx as "sepsis" and started on zosyn for possible gram negative infection.  He is noted to have crypto on his stool assay.  In ED he had temp 96.1 but has since been normal. He has been intermittently hypotensive.    His AG is 12 (prev 14 with a CO2 of 13 on adm), his potassium is normal. His WBC is 18.5 -->11.4. HIV (-).  He has BCx pending.   8-9 BM today, urinating. No BRBPR. No abd pain. No f/c.   Review of Systems: Review of Systems  Constitutional: Negative for chills and fever.  Eyes: Negative for blurred vision.  Respiratory: Negative for cough and shortness of breath.   Gastrointestinal: Positive for diarrhea, nausea and vomiting. Negative for abdominal pain and blood in stool.  Genitourinary: Negative for dysuria.  Skin: Negative for rash.  Please see HPI. 12 point ROS o/w (-)   Past Medical History:  Diagnosis Date  . Anginal pain ([place])   . Anxiety   . Arthritis    SPINE   . Arthritis   . Coronary artery disease    s/p multiple percutaneous interventions, PCI 202 for DMI and DES distal RCA and CFX-OM 2006  . Coronary artery disease   . Depression   . GERD (gastroesophageal reflux disease)   . Hyperlipidemia    mixed  . Hyperlipidemia   . Hypertension   . MI (myocardial infarction) ([place])   . Myocardial infarction ([place])   . Spondylitis, ankylosing ([place])     Social History  Substance Use  Topics  . Smoking status: Former Smoker    Packs/day: 2.00    Years: 10.00    Types: Cigarettes    Quit date: 06/30/1979  . Smokeless tobacco: Never Used  . Alcohol use 13.2 oz/week    14 Glasses of wine, 2 Shots of liquor per week     Comment: glass of wine at dinner everyday.    Family History  Problem Relation Age of Onset  . Breast cancer Mother   . Heart disease Father   . Heart disease Brother   . Leukemia Maternal Grandmother   . Heart disease Maternal Grandfather   . Heart disease Unknown        positive for cardiac disease and both brothers have had cardiac events  . Heart attack Brother      Medications:  Scheduled: . aspirin  81 mg Oral QPM  . atorvastatin  40 mg Oral Daily  . buPROPion  300 mg Oral Daily  . clopidogrel  75 mg Oral Daily  . feeding  supplement (ENSURE ENLIVE)  237 mL Oral BID BM  . fluticasone  1 spray Each Nare QHS  . heparin  5,000 Units Subcutaneous Q8H  . nefazodone  300 mg Oral Daily  . pantoprazole  40 mg Oral Daily  . sodium chloride flush  3 mL Intravenous Q12H    Abtx:  Anti-infectives    Start     Dose/Rate Route Frequency Ordered Stop   12/03/16 1400  piperacillin-tazobactam (ZOSYN) IVPB 2.25 g  Status:  Discontinued     2.25 g 100 mL/hr over 30 Minutes Intravenous Every 8 hours 12/03/16 0330 12/03/16 1248   12/03/16 0330  piperacillin-tazobactam (ZOSYN) IVPB 3.375 g     3.375 g 100 mL/hr over 30 Minutes Intravenous  Once 12/03/16 0330 12/03/16 0456        OBJECTIVE: Blood pressure (!) 88/42, pulse (!) 56, temperature 98.2 F (36.8 C), temperature source Oral, resp. rate (!) 21, height '6\' 2"'  (1.88 m), weight 110.2 kg (242 lb 15.2 oz), SpO2 100 %.  Physical Exam  Constitutional: No distress.  HENT:  Mouth/Throat: No oropharyngeal exudate.  Eyes: Pupils are equal, round, and reactive to light. EOM are normal.  Neck: Neck supple.  Cardiovascular: Normal rate, regular rhythm and normal heart sounds.   Pulmonary/Chest: Effort normal and breath sounds normal.  Abdominal: Soft. Bowel sounds are normal. He exhibits distension. There is no tenderness. There is no rebound.  Musculoskeletal: He exhibits no edema.  Lymphadenopathy:    He has no cervical adenopathy.  Skin: Skin is warm. No erythema.  Psychiatric: Mood normal.    Lab Results Results for orders placed or performed during the hospital encounter of 12/02/16 (from the past 48 hour(s))  Lipase, blood     Status: Abnormal   Collection Time: 12/02/16 11:00 PM  Result Value Ref Range   Lipase 169 (H) 11 - 51 U/L  Comprehensive metabolic panel     Status: Abnormal   Collection Time: 12/02/16 11:00 PM  Result Value Ref Range   Sodium 128 (L) 135 - 145 mmol/L   Potassium 3.9 3.5 - 5.1 mmol/L   Chloride 101 101 - 111 mmol/L   CO2 13 (L) 22  - 32 mmol/L   Glucose, Bld 111 (H) 65 - 99 mg/dL   BUN 42 (H) 6 - 20 mg/dL   Creatinine, Ser 6.18 (H) 0.61 - 1.24 mg/dL   Calcium 8.9 8.9 - 10.3 mg/dL   Total Protein 8.2 (H) 6.5 -  8.1 g/dL   Albumin 3.6 3.5 - 5.0 g/dL   AST 42 (H) 15 - 41 U/L   ALT 42 17 - 63 U/L   Alkaline Phosphatase 73 38 - 126 U/L   Total Bilirubin 1.0 0.3 - 1.2 mg/dL   GFR calc non Af Amer 8 (L) >60 mL/min   GFR calc Af Amer 10 (L) >60 mL/min    Comment: (NOTE) The eGFR has been calculated using the CKD EPI equation. This calculation has not been validated in all clinical situations. eGFR's persistently <60 mL/min signify possible Chronic Kidney Disease.    Anion gap 14 5 - 15  CBC     Status: Abnormal   Collection Time: 12/02/16 11:00 PM  Result Value Ref Range   WBC 18.5 (H) 4.0 - 10.5 K/uL   RBC 4.53 4.22 - 5.81 MIL/uL   Hemoglobin 15.0 13.0 - 17.0 g/dL   HCT 42.5 39.0 - 52.0 %   MCV 93.8 78.0 - 100.0 fL   MCH 33.1 26.0 - 34.0 pg   MCHC 35.3 30.0 - 36.0 g/dL   RDW 13.3 11.5 - 15.5 %   Platelets 246 150 - 400 K/uL  C difficile quick scan w PCR reflex     Status: None   Collection Time: 12/02/16 11:49 PM  Result Value Ref Range   C Diff antigen NEGATIVE NEGATIVE   C Diff toxin NEGATIVE NEGATIVE   C Diff interpretation No C. difficile detected.   Gastrointestinal Panel by PCR , Stool     Status: Abnormal   Collection Time: 12/02/16 11:49 PM  Result Value Ref Range   Campylobacter species NOT DETECTED NOT DETECTED   Plesimonas shigelloides NOT DETECTED NOT DETECTED   Salmonella species NOT DETECTED NOT DETECTED   Yersinia enterocolitica NOT DETECTED NOT DETECTED   Vibrio species NOT DETECTED NOT DETECTED   Vibrio cholerae NOT DETECTED NOT DETECTED   Enteroaggregative E coli (EAEC) NOT DETECTED NOT DETECTED   Enteropathogenic E coli (EPEC) NOT DETECTED NOT DETECTED   Enterotoxigenic E coli (ETEC) NOT DETECTED NOT DETECTED   Shiga like toxin producing E coli (STEC) NOT DETECTED NOT DETECTED    Shigella/Enteroinvasive E coli (EIEC) NOT DETECTED NOT DETECTED   Cryptosporidium DETECTED (A) NOT DETECTED   Cyclospora cayetanensis NOT DETECTED NOT DETECTED   Entamoeba histolytica NOT DETECTED NOT DETECTED   Giardia lamblia NOT DETECTED NOT DETECTED   Adenovirus F40/41 NOT DETECTED NOT DETECTED   Astrovirus NOT DETECTED NOT DETECTED   Norovirus GI/GII NOT DETECTED NOT DETECTED   Rotavirus A NOT DETECTED NOT DETECTED   Sapovirus (I, II, IV, and V) NOT DETECTED NOT DETECTED  I-Stat CG4 Lactic Acid, ED     Status: None   Collection Time: 12/03/16 12:17 AM  Result Value Ref Range   Lactic Acid, Venous 1.01 0.5 - 1.9 mmol/L  HIV antibody     Status: None   Collection Time: 12/03/16  6:15 AM  Result Value Ref Range   HIV Screen 4th Generation wRfx Non Reactive Non Reactive    Comment: (NOTE) Performed At: [facility] [place], [place] [id number] Lindon Romp MD [id number]   Comprehensive metabolic panel     Status: Abnormal   Collection Time: 12/03/16  6:15 AM  Result Value Ref Range   Sodium 129 (L) 135 - 145 mmol/L   Potassium 3.8 3.5 - 5.1 mmol/L   Chloride 105 101 - 111 mmol/L   CO2 12 (L) 22 - 32 mmol/L  Glucose, Bld 100 (H) 65 - 99 mg/dL   BUN 42 (H) 6 - 20 mg/dL   Creatinine, Ser 4.91 (H) 0.61 - 1.24 mg/dL   Calcium 7.6 (L) 8.9 - 10.3 mg/dL   Total Protein 6.8 6.5 - 8.1 g/dL   Albumin 2.9 (L) 3.5 - 5.0 g/dL   AST 30 15 - 41 U/L   ALT 33 17 - 63 U/L   Alkaline Phosphatase 57 38 - 126 U/L   Total Bilirubin 0.9 0.3 - 1.2 mg/dL   GFR calc non Af Amer 11 (L) >60 mL/min   GFR calc Af Amer 13 (L) >60 mL/min    Comment: (NOTE) The eGFR has been calculated using the CKD EPI equation. This calculation has not been validated in all clinical situations. eGFR's persistently <60 mL/min signify possible Chronic Kidney Disease.    Anion gap 12 5 - 15  CBC     Status: Abnormal   Collection Time: 12/03/16  6:15 AM  Result Value Ref Range   WBC  11.4 (H) 4.0 - 10.5 K/uL   RBC 3.98 (L) 4.22 - 5.81 MIL/uL   Hemoglobin 12.6 (L) 13.0 - 17.0 g/dL   HCT 37.5 (L) 39.0 - 52.0 %   MCV 94.2 78.0 - 100.0 fL   MCH 31.7 26.0 - 34.0 pg   MCHC 33.6 30.0 - 36.0 g/dL   RDW 13.5 11.5 - 15.5 %   Platelets 175 150 - 400 K/uL  Glucose, capillary     Status: None   Collection Time: 12/03/16  8:23 AM  Result Value Ref Range   Glucose-Capillary 78 65 - 99 mg/dL      Component Value Date/Time   SDES WOUND LEG LEFT 08/17/2007 1600   SDES WOUND LEG LEFT 08/17/2007 1600   SDES WOUND LEG LEFT 08/17/2007 1600   SPECREQUEST NONE 08/17/2007 1600   SPECREQUEST NONE 08/17/2007 1600   SPECREQUEST NONE 08/17/2007 1600   CULT NO ANAEROBES ISOLATED 08/17/2007 1600   CULT NO GROWTH 2 DAYS 08/17/2007 1600   REPTSTATUS 08/17/2007 FINAL 08/17/2007 1600   REPTSTATUS 08/21/2007 FINAL 08/17/2007 1600   REPTSTATUS 08/20/2007 FINAL 08/17/2007 1600   Dg Chest Portable 1 View  Result Date: 12/03/2016 CLINICAL DATA:  Diarrhea EXAM: PORTABLE CHEST 1 VIEW COMPARISON:  Chest radiograph 12/01/2013 FINDINGS: Unchanged cardiomegaly. Status post median sternotomy. No focal airspace consolidation or pulmonary edema. No pneumothorax or sizable pleural effusion. IMPRESSION: Unchanged cardiomegaly without active cardiopulmonary disease. Electronically Signed   By: Ulyses Jarred M.D.   On: 12/03/2016 00:55   Recent Results (from the past 240 hour(s))  C difficile quick scan w PCR reflex     Status: None   Collection Time: 12/02/16 11:49 PM  Result Value Ref Range Status   C Diff antigen NEGATIVE NEGATIVE Final   C Diff toxin NEGATIVE NEGATIVE Final   C Diff interpretation No C. difficile detected.  Final  Gastrointestinal Panel by PCR , Stool     Status: Abnormal   Collection Time: 12/02/16 11:49 PM  Result Value Ref Range Status   Campylobacter species NOT DETECTED NOT DETECTED Final   Plesimonas shigelloides NOT DETECTED NOT DETECTED Final   Salmonella species NOT DETECTED  NOT DETECTED Final   Yersinia enterocolitica NOT DETECTED NOT DETECTED Final   Vibrio species NOT DETECTED NOT DETECTED Final   Vibrio cholerae NOT DETECTED NOT DETECTED Final   Enteroaggregative E coli (EAEC) NOT DETECTED NOT DETECTED Final   Enteropathogenic E coli (EPEC) NOT DETECTED NOT DETECTED Final  Enterotoxigenic E coli (ETEC) NOT DETECTED NOT DETECTED Final   Shiga like toxin producing E coli (STEC) NOT DETECTED NOT DETECTED Final   Shigella/Enteroinvasive E coli (EIEC) NOT DETECTED NOT DETECTED Final   Cryptosporidium DETECTED (A) NOT DETECTED Final   Cyclospora cayetanensis NOT DETECTED NOT DETECTED Final   Entamoeba histolytica NOT DETECTED NOT DETECTED Final   Giardia lamblia NOT DETECTED NOT DETECTED Final   Adenovirus F40/41 NOT DETECTED NOT DETECTED Final   Astrovirus NOT DETECTED NOT DETECTED Final   Norovirus GI/GII NOT DETECTED NOT DETECTED Final   Rotavirus A NOT DETECTED NOT DETECTED Final   Sapovirus (I, II, IV, and V) NOT DETECTED NOT DETECTED Final    Microbiology: Recent Results (from the past 240 hour(s))  C difficile quick scan w PCR reflex     Status: None   Collection Time: 12/02/16 11:49 PM  Result Value Ref Range Status   C Diff antigen NEGATIVE NEGATIVE Final   C Diff toxin NEGATIVE NEGATIVE Final   C Diff interpretation No C. difficile detected.  Final  Gastrointestinal Panel by PCR , Stool     Status: Abnormal   Collection Time: 12/02/16 11:49 PM  Result Value Ref Range Status   Campylobacter species NOT DETECTED NOT DETECTED Final   Plesimonas shigelloides NOT DETECTED NOT DETECTED Final   Salmonella species NOT DETECTED NOT DETECTED Final   Yersinia enterocolitica NOT DETECTED NOT DETECTED Final   Vibrio species NOT DETECTED NOT DETECTED Final   Vibrio cholerae NOT DETECTED NOT DETECTED Final   Enteroaggregative E coli (EAEC) NOT DETECTED NOT DETECTED Final   Enteropathogenic E coli (EPEC) NOT DETECTED NOT DETECTED Final   Enterotoxigenic E  coli (ETEC) NOT DETECTED NOT DETECTED Final   Shiga like toxin producing E coli (STEC) NOT DETECTED NOT DETECTED Final   Shigella/Enteroinvasive E coli (EIEC) NOT DETECTED NOT DETECTED Final   Cryptosporidium DETECTED (A) NOT DETECTED Final   Cyclospora cayetanensis NOT DETECTED NOT DETECTED Final   Entamoeba histolytica NOT DETECTED NOT DETECTED Final   Giardia lamblia NOT DETECTED NOT DETECTED Final   Adenovirus F40/41 NOT DETECTED NOT DETECTED Final   Astrovirus NOT DETECTED NOT DETECTED Final   Norovirus GI/GII NOT DETECTED NOT DETECTED Final   Rotavirus A NOT DETECTED NOT DETECTED Final   Sapovirus (I, II, IV, and V) NOT DETECTED NOT DETECTED Final    Bobby Rumpf, MD [facility] for Infectious Disease [facility] [phone] 12/03/2016, 8:45 PM

## 2016-12-03 NOTE — Progress Notes (Addendum)
Pharmacy Antibiotic Note  Gregory Hansen is a 66 y.o. male admitted on 12/02/2016 with concern for sepsis w/ abdominal source.  Pharmacy has been consulted for Zosyn dosing.  Pt w/ AKI, SCr 6.18, baseline 1.  Plan: Zosyn 3.375g x1 then 2.25g IV q8h.  Height: 6\' 2"  (188 cm) Weight: 250 lb (113.4 kg) IBW/kg (Calculated) : 82.2  Temp (24hrs), Avg:96.7 F (35.9 C), Min:96.1 F (35.6 C), Max:97.2 F (36.2 C)   Recent Labs Lab 12/02/16 2300 12/03/16 0017  WBC 18.5*  --   CREATININE 6.18*  --   LATICACIDVEN  --  1.01    Estimated Creatinine Clearance: 15.5 mL/min (A) (by C-G formula based on SCr of 6.18 mg/dL (H)).    No Known Allergies   Thank you for allowing pharmacy to be a part of this patient's care.  Wynona Neat, PharmD, BCPS  12/03/2016 3:27 AM

## 2016-12-04 LAB — COMPREHENSIVE METABOLIC PANEL
ALBUMIN: 2.7 g/dL — AB (ref 3.5–5.0)
ALT: 29 U/L (ref 17–63)
ANION GAP: 6 (ref 5–15)
AST: 27 U/L (ref 15–41)
Alkaline Phosphatase: 54 U/L (ref 38–126)
BUN: 29 mg/dL — ABNORMAL HIGH (ref 6–20)
CALCIUM: 7.5 mg/dL — AB (ref 8.9–10.3)
CO2: 14 mmol/L — AB (ref 22–32)
Chloride: 115 mmol/L — ABNORMAL HIGH (ref 101–111)
Creatinine, Ser: 1.89 mg/dL — ABNORMAL HIGH (ref 0.61–1.24)
GFR calc non Af Amer: 35 mL/min — ABNORMAL LOW (ref >60)
GFR, EST AFRICAN AMERICAN: 41 mL/min — AB (ref >60)
GLUCOSE: 110 mg/dL — AB (ref 65–99)
POTASSIUM: 3.3 mmol/L — AB (ref 3.5–5.1)
SODIUM: 135 mmol/L (ref 135–145)
Total Bilirubin: 0.7 mg/dL (ref 0.3–1.2)
Total Protein: 6.6 g/dL (ref 6.5–8.1)

## 2016-12-04 LAB — CBC WITH DIFFERENTIAL/PLATELET
Basophils Absolute: 0 10*3/uL (ref 0.0–0.1)
Basophils Relative: 1 %
EOS ABS: 0.1 10*3/uL (ref 0.0–0.7)
EOS PCT: 2 %
HCT: 34.8 % — ABNORMAL LOW (ref 39.0–52.0)
Hemoglobin: 11.9 g/dL — ABNORMAL LOW (ref 13.0–17.0)
LYMPHS ABS: 1.3 10*3/uL (ref 0.7–4.0)
LYMPHS PCT: 17 %
MCH: 32.2 pg (ref 26.0–34.0)
MCHC: 34.2 g/dL (ref 30.0–36.0)
MCV: 94.3 fL (ref 78.0–100.0)
MONOS PCT: 9 %
Monocytes Absolute: 0.7 10*3/uL (ref 0.1–1.0)
Neutro Abs: 5.3 10*3/uL (ref 1.7–7.7)
Neutrophils Relative %: 71 %
PLATELETS: 144 10*3/uL — AB (ref 150–400)
RBC: 3.69 MIL/uL — AB (ref 4.22–5.81)
RDW: 13.7 % (ref 11.5–15.5)
WBC: 7.5 10*3/uL (ref 4.0–10.5)

## 2016-12-04 LAB — TROPONIN I
Troponin I: <0.03 ng/mL (ref <0.03)
Troponin I: <0.03 ng/mL (ref <0.03)

## 2016-12-04 MED ORDER — POTASSIUM CHLORIDE IN NACL 20-0.9 MEQ/L-% IV SOLN
INTRAVENOUS | Status: DC
  Administered 2016-12-04 – 2016-12-05 (×3): via INTRAVENOUS
  Filled 2016-12-04 (×4): qty 1000

## 2016-12-04 MED ORDER — ENSURE ENLIVE PO LIQD
237.0000 mL | Freq: Two times a day (BID) | ORAL | Status: DC
  Administered 2016-12-04 – 2016-12-05 (×2): 237 mL via ORAL

## 2016-12-04 MED ORDER — SODIUM CHLORIDE 0.9 % IV SOLN
INTRAVENOUS | Status: DC
  Administered 2016-12-04 (×2): via INTRAVENOUS
  Filled 2016-12-04 (×7): qty 1000

## 2016-12-04 MED ORDER — ENSURE ENLIVE PO LIQD: 237.0000 mL | Freq: Three times a day (TID) | ORAL | Status: DC

## 2016-12-04 NOTE — Plan of Care (Signed)
Problem: Physical Regulation: Goal: Ability to maintain clinical measurements within normal limits will improve Outcome: Progressing Patient had 4 liquid stools last night. Able to sleep between trips to bathroom. Patient states he feels weak , but not dizzy. Able to maintain blood pressures with 1000 mL bolus and NS running at 250/hr overnight. No signs of fluid overload.

## 2016-12-04 NOTE — Progress Notes (Signed)
INFECTIOUS DISEASE PROGRESS NOTE  ID: Gregory Hansen is a 66 y.o. male with  Principal Problem:   Sepsis ([place]) Active Problems:   S/P CABG x 4   Acute kidney injury ([place])   Aortic atherosclerosis ([place])   Hyponatremia   Hypoalbuminemia   Metabolic acidosis   Hypovolemia with active loss of fluid   Diarrhea due to cryptosporidium (HCC)   Hypotension  Subjective: Resting quielty  Abtx:  Anti-infectives    Start     Dose/Rate Route Frequency Ordered Stop   12/03/16 1400  piperacillin-tazobactam (ZOSYN) IVPB 2.25 g  Status:  Discontinued     2.25 g 100 mL/hr over 30 Minutes Intravenous Every 8 hours 12/03/16 0330 12/03/16 1248   12/03/16 0330  piperacillin-tazobactam (ZOSYN) IVPB 3.375 g     3.375 g 100 mL/hr over 30 Minutes Intravenous  Once 12/03/16 0330 12/03/16 0456      Medications:  Scheduled: . aspirin  81 mg Oral QPM  . atorvastatin  40 mg Oral Daily  . buPROPion  300 mg Oral Daily  . clopidogrel  75 mg Oral Daily  . feeding supplement (ENSURE ENLIVE)  237 mL Oral BID BM  . fluticasone  1 spray Each Nare QHS  . heparin  5,000 Units Subcutaneous Q8H  . nefazodone  300 mg Oral Daily  . pantoprazole  40 mg Oral Daily  . sodium chloride flush  3 mL Intravenous Q12H    Objective: Vital signs in last 24 hours: Temp:  [97.5 F (36.4 C)-98.5 F (36.9 C)] 98 F (36.7 C) (09/26 1232) Pulse Rate:  [56-95] 78 (09/26 1232) Resp:  [18-24] 23 (09/26 1232) BP: (88-156)/(42-79) 137/73 (09/26 1232) SpO2:  [98 %-100 %] 98 % (09/26 1232) Weight:  [113.7 kg (250 lb 9.6 oz)] 113.7 kg (250 lb 9.6 oz) (09/26 0500)   General appearance: fatigued and no distress Resp: clear to auscultation bilaterally Cardio: regular rate and rhythm GI: normal findings: bowel sounds normal and soft, non-tender and abnormal findings:  distended  Lab Results  Recent Labs  12/03/16 0615 12/04/16 0408  WBC 11.4* 7.5  HGB 12.6* 11.9*  HCT 37.5* 34.8*  NA 129* 135  K 3.8 3.3*  CL 105  115*  CO2 12* 14*  BUN 42* 29*  CREATININE 4.91* 1.89*   Liver Panel  Recent Labs  12/03/16 0615 12/04/16 0408  PROT 6.8 6.6  ALBUMIN 2.9* 2.7*  AST 30 27  ALT 33 29  ALKPHOS 57 54  BILITOT 0.9 0.7   Sedimentation Rate No results for input(s): ESRSEDRATE in the last 72 hours. C-Reactive Protein No results for input(s): CRP in the last 72 hours.  Microbiology: Recent Results (from the past 240 hour(s))  C difficile quick scan w PCR reflex     Status: None   Collection Time: 12/02/16 11:49 PM  Result Value Ref Range Status   C Diff antigen NEGATIVE NEGATIVE Final   C Diff toxin NEGATIVE NEGATIVE Final   C Diff interpretation No C. difficile detected.  Final  Gastrointestinal Panel by PCR , Stool     Status: Abnormal   Collection Time: 12/02/16 11:49 PM  Result Value Ref Range Status   Campylobacter species NOT DETECTED NOT DETECTED Final   Plesimonas shigelloides NOT DETECTED NOT DETECTED Final   Salmonella species NOT DETECTED NOT DETECTED Final   Yersinia enterocolitica NOT DETECTED NOT DETECTED Final   Vibrio species NOT DETECTED NOT DETECTED Final   Vibrio cholerae NOT DETECTED NOT DETECTED Final  Enteroaggregative E coli (EAEC) NOT DETECTED NOT DETECTED Final   Enteropathogenic E coli (EPEC) NOT DETECTED NOT DETECTED Final   Enterotoxigenic E coli (ETEC) NOT DETECTED NOT DETECTED Final   Shiga like toxin producing E coli (STEC) NOT DETECTED NOT DETECTED Final   Shigella/Enteroinvasive E coli (EIEC) NOT DETECTED NOT DETECTED Final   Cryptosporidium DETECTED (A) NOT DETECTED Final   Cyclospora cayetanensis NOT DETECTED NOT DETECTED Final   Entamoeba histolytica NOT DETECTED NOT DETECTED Final   Giardia lamblia NOT DETECTED NOT DETECTED Final   Adenovirus F40/41 NOT DETECTED NOT DETECTED Final   Astrovirus NOT DETECTED NOT DETECTED Final   Norovirus GI/GII NOT DETECTED NOT DETECTED Final   Rotavirus A NOT DETECTED NOT DETECTED Final   Sapovirus (I, II, IV, and V)  NOT DETECTED NOT DETECTED Final    Studies/Results: Dg Chest Portable 1 View  Result Date: 12/03/2016 CLINICAL DATA:  Diarrhea EXAM: PORTABLE CHEST 1 VIEW COMPARISON:  Chest radiograph 12/01/2013 FINDINGS: Unchanged cardiomegaly. Status post median sternotomy. No focal airspace consolidation or pulmonary edema. No pneumothorax or sizable pleural effusion. IMPRESSION: Unchanged cardiomegaly without active cardiopulmonary disease. Electronically Signed   By: Ulyses Jarred M.D.   On: 12/03/2016 00:55     Assessment/Plan: Gastroenteritis Cryptosporidium   Total days of antibiotics: off  8 bm charted since 10:30pm Temp normal, not tachycardic Cr improved, CO2 better but still not normal Will continue to watch.            Bobby Rumpf MD, FACP Infectious Diseases (pager) [phone] [email or web address] 12/04/2016, 1:46 PM  LOS: 1 day

## 2016-12-04 NOTE — Progress Notes (Signed)
Patient trasfered from 57M to [phone] via bed; alert and oriented x 4; no complaints of pain; IV saline locked in LFA. Orient patient to room and unit; instructed how to use the call bell and  fall risk precautions. Will continue to monitor the patient.

## 2016-12-04 NOTE — Progress Notes (Signed)
Family Medicine Teaching Service Daily Progress Note Intern Pager: [phone]  Patient name: Gregory Hansen Medical record number: [id number] Date of birth: 1950-04-22 Age: 66 y.o. Gender: male  Primary Care Provider: Patient, No Pcp Per Consultants: Pharmacy, Infectious Disease Code Status: Full  Pt Overview and Major Events to Date:  9/24 - admitted for intractable diarrhea, sepsis criteria met 9/25 - Zosyn d/ced, ID consult, BP improving  Assessment and Plan: Gregory Hansen is a 66 y.o. male presenting with intractable diarrhea. PMH is significant for HTN, CAD status post stent 2 and quadruple CABG, depression, anxiety, GERD and ankylosing spondylitis.   Sepsis 2/2 Diarrhea with associated nausea/vomiting: Initially meeting sepsis criteria with T96.1, RR 22, WBC 18.5, SBP in 70s and suspected source with diarrheal illness. BP has since improved with IV hydration however remains low normal. S/p 4.5L NS bolus. Patient was recently seen and treated in the hospital for acute diarrhea and found to have Cryptosporidium. Patient was immunocompetent and responded well to rehydration and symptomatic treatment and was discharged on 9/16. Etiology is continued Cryptosporidium vs gastritis (via new viral pathogen) vs colitis. New pathogen not likely as GI panel only positive for cryptosporidium and can relapse after initial treatment with symptoms persisting for 10-14 days after initial incubation. Could also consider post-infectious IBS per ID but too early after initial presentation to tell. C diff negative.  ID consulted and does not recommend treatment with anti-protozoal at this time and continue to hydrate aggressively with supportive management. S/p 1 dose zosyn (9/25). No vomiting since before admission. - monitor on telemetry - IVF NS _0 /hr with KCl - full thickened liquids - Phenergan PRN for nausea - BCx pending - consider repeating abdominal xray if continued pain  Acidosis: NAGMA due  to profuse diarrhea and vomiting. BMP 128/3.9/101/13/42/6.18<111 Anticipate acidosis will improve as diarrhea subsides and volume status improves. Na improving to 135 and Cr 1.89 this am 9/26. CO2 slowly improving to 14. - monitor with BMP - continue IV hydration   Acute renal failure: Cr on admission 6.18, baseline Cr of ~0.9. Highly likely this is prerenal as patient was dehydrated on exam and has known cause for being volume depleted. Patient presented with decreased urination, has urinated since admitted, total UOP 187m last 24 hours although 4 unmeasured outputs. Net +9.6L. No signs of volume overload on exam. Cr 4.91 > 1.89 9/26. Back to admission weight, 250lbs. 256 in 04/2016 - Continue aggressive IV hydration - Monitor with BMP daily - monitor strict I/O, daily weights  Borderline Prolonged QT: stable Qtc noted to be 482 on admission. - Holding agents that could prolong his QT, such as Zofran - on telemetry  History of CAD: s/p stent 2 in 2002 and 2006 and quadruple CABG in 2015. No chest pain and EKG negative for ACS. Presented with marked hypotension (65/41) in the setting of persistent diarrhea. Trop x2 obtained yesterday to ensure low BP weren't straining his heart and were negative x2. No chest pain. BP improving with aggressive IV hydration. - Holding Metoprolol 50 mg twice a day. - Continue home aspirin, Plavix and atorvastatin. - add back metop once BP has stabilized  Hyperglycemia: BMP glucose 111 on admission. A1c 5.2. CBG 110 this am 9/25. -continue to follow per PCP outpatient  Hypokalemia 3.3 this am 9/26. - added 258m to IVF  Depression/anxiety: Stable. Denies SI/HI -Continue home Wellbutrin and nefazodone  FEN/GI: thickened liquids Prophylaxis: Heparin  Disposition: continue inpatient management with IV hydration, diarrhea workup  Subjective:  Pt feels ok today, denies abdominal pain, vomiting. Continues to endorse diarrheal episodes, no change. Able  to stand and walk without assistance. Understands treatment plan for today.  Objective: Temp:  [97.3 F (36.3 C)-98.5 F (36.9 C)] 98.5 F (36.9 C) (09/26 0330) Pulse Rate:  [56-95] 95 (09/26 0330) Resp:  [19-23] 22 (09/26 0330) BP: (83-156)/(42-77) 156/77 (09/26 0330) SpO2:  [97 %-100 %] 100 % (09/25 1925) Weight:  [250 lb 9.6 oz (113.7 kg)] 250 lb 9.6 oz (113.7 kg) (09/26 0500)  Physical Exam: General: appears comfortable lying in bed, in NAD, able to walk without assistance Cardiovascular: RRR, no murmur Respiratory: CTA bilaterally, no wheezes/crackles Abdomen: non tender to palpation, hyperactive BS, slightly distended Extremities: WWP, no LE edema, dorsal pedal pulses appreciated  Laboratory:  Recent Labs Lab 12/02/16 2300 12/03/16 0615 12/04/16 0408  WBC 18.5* 11.4* 7.5  HGB 15.0 12.6* 11.9*  HCT 42.5 37.5* 34.8*  PLT 246 175 144*    Recent Labs Lab 12/02/16 2300 12/03/16 0615 12/04/16 0408  NA 128* 129* 135  K 3.9 3.8 3.3*  CL 101 105 115*  CO2 13* 12* 14*  BUN 42* 42* 29*  CREATININE 6.18* 4.91* 1.89*  CALCIUM 8.9 7.6* 7.5*  PROT 8.2* 6.8 6.6  BILITOT 1.0 0.9 0.7  ALKPHOS 73 57 54  ALT 42 33 29  AST 42* 30 27  GLUCOSE 111* 100* 110*   9/25 Trop x2 - negative GI panel - positive for Cryptosporidium C diff - negative Lactic acid - 1.01 HIV non reactive  Imaging/Diagnostic Tests:  CXR 9/25 FINDINGS: Unchanged cardiomegaly. Status post median sternotomy. No focal airspace consolidation or pulmonary edema. No pneumothorax or sizable pleural effusion. IMPRESSION: Unchanged cardiomegaly without active cardiopulmonary disease.  Gregory Hansen 12/04/2016, 6:50 AM PGY-1, [place] Intern pager: [phone], text pages welcome

## 2016-12-04 NOTE — Progress Notes (Signed)
Initial Nutrition Assessment  DOCUMENTATION CODES:   Obesity unspecified  INTERVENTION:   -Continue Ensure Enlive BID (each supplement provides 350 kcals and 20 grams of protein)  NUTRITION DIAGNOSIS:   Inadequate oral intake related to acute illness (diarrhea, n/v) as evidenced by meal completion < 50%.  GOAL:   Patient will meet greater than or equal to 90% of their needs  MONITOR:   PO intake, Labs, Supplement acceptance, Weight trends  REASON FOR ASSESSMENT:   Malnutrition Screening Tool    ASSESSMENT:   66 year old presenting with intractable diarrhea and cryptosporidium. PMH of HTN, CAD status post stent x 2, quadruple CABG, ankylosing spondylitis, GERD, depression, and anxiety.  PTA pt reported eating bananas, apples, rice, toast, and vegetable broth as tolerated. Pt reports continued diarrhea, 8-9 times per day. Pt states he's still nauseous.   Pt ate 2 strips of bacon, peaches, and a cup of orange juice for breakfast. He then had a strawberry Ensure and reported really "liking it". Per RN, pt vomited up breakfast yesterday. Per chart pt has averaged 50% meal consumption.  Pt reports losing 15 lbs in past two weeks. Per weight readings in the chart pt has lost 4 lbs since 11/24/16.  Pt reports feeling extremely thirsty and dehydrated with low appetite.  Medications: Ensure (2x/ day), Protonix  Labs: Glucose 110 (H), Cr 1.89 (H), Ca 7.5 (H), GFR 35 (L)  Nutrition Focused Physical Exam Findings:  Normal   Diet Order:  Diet regular Room service appropriate? Yes; Fluid consistency: Thin  Skin:  Reviewed, no issues (Moisture skin damage on buttocks)  Last BM:  12/03/16  Height:   Ht Readings from Last 1 Encounters:  12/03/16 6\' 2"  (1.88 m)    Weight:   Wt Readings from Last 1 Encounters:  12/04/16 250 lb 9.6 oz (113.7 kg)    Ideal Body Weight:  86 kg  BMI:  Body mass index is 32.18 kg/m.  Estimated Nutritional Needs:   Kcal:  2200-2400  kcals  Protein:  100-115 grams of protein  Fluid:  >/= 2.2 L  EDUCATION NEEDS:   No education needs identified at this time  [place] Intern Pager: [phone] 12/04/2016 12:10 PM

## 2016-12-05 LAB — CBC
HCT: 35.2 % — ABNORMAL LOW (ref 39.0–52.0)
Hemoglobin: 11.7 g/dL — ABNORMAL LOW (ref 13.0–17.0)
MCH: 31.3 pg (ref 26.0–34.0)
MCHC: 33.2 g/dL (ref 30.0–36.0)
MCV: 94.1 fL (ref 78.0–100.0)
PLATELETS: 131 10*3/uL — AB (ref 150–400)
RBC: 3.74 MIL/uL — AB (ref 4.22–5.81)
RDW: 13.7 % (ref 11.5–15.5)
WBC: 6.1 10*3/uL (ref 4.0–10.5)

## 2016-12-05 LAB — BASIC METABOLIC PANEL
ANION GAP: 4 — AB (ref 5–15)
BUN: 10 mg/dL (ref 6–20)
CO2: 18 mmol/L — ABNORMAL LOW (ref 22–32)
Calcium: 7.7 mg/dL — ABNORMAL LOW (ref 8.9–10.3)
Chloride: 116 mmol/L — ABNORMAL HIGH (ref 101–111)
Creatinine, Ser: 0.86 mg/dL (ref 0.61–1.24)
Glucose, Bld: 95 mg/dL (ref 65–99)
POTASSIUM: 3.6 mmol/L (ref 3.5–5.1)
SODIUM: 138 mmol/L (ref 135–145)

## 2016-12-05 MED ORDER — POTASSIUM CHLORIDE CRYS ER 20 MEQ PO TBCR
40.0000 meq | EXTENDED_RELEASE_TABLET | Freq: Once | ORAL | Status: AC
  Administered 2016-12-05: 40 meq via ORAL
  Filled 2016-12-05: qty 2

## 2016-12-05 MED ORDER — METOPROLOL SUCCINATE ER 100 MG PO TB24: 100.0000 mg | ORAL_TABLET | Freq: Every day | ORAL | Status: DC

## 2016-12-05 NOTE — Progress Notes (Signed)
Family Medicine Teaching Service Daily Progress Note Intern Pager: [phone]  Patient name: Gregory Hansen Medical record number: [id number] Date of birth: June 06, 1950 Age: 66 y.o. Gender: male  Primary Care Provider: Patient, No Pcp Per Consultants: Pharmacy, Infectious Disease Code Status: Full  Pt Overview and Major Events to Date:  9/24 - admitted for intractable diarrhea, sepsis criteria met 9/25 - Zosyn d/ced, ID consult, BP improving 9/27 - IVF d/ced  Assessment and Plan: Gregory Hansen is a 66 y.o. male presenting with intractable diarrhea. PMH is significant for HTN, CAD status post stent 2 and quadruple CABG, depression, anxiety, GERD and ankylosing spondylitis.   Sepsis 2/2 Diarrhea with associated nausea/vomiting: Initially meeting sepsis criteria with T96.1, RR 22, WBC 18.5, SBP in 70s and suspected source with diarrheal illness. BP has since improved with IV hydration however remains low normal. S/p 4.5L NS bolus. Patient was recently seen and treated in the hospital for acute diarrhea and found to have Cryptosporidium. Patient was immunocompetent and responded well to rehydration and symptomatic treatment and was discharged on 9/16. Etiology is continued Cryptosporidium vs gastritis (via new viral pathogen) vs colitis. New pathogen not likely as GI panel this admission only positive for cryptosporidium and can relapse after initial treatment with symptoms persisting for 10-14 days after initial incubation. Could also consider post-infectious IBS per ID but too early after initial presentation to tell. C diff negative.  Blood cx NGx1d. ID consulted and does not recommend treatment with anti-protozoal at this time and continue to hydrate aggressively with supportive management. S/p 1 dose zosyn (9/25). No vomiting since 9/25. IVF reduced to 73m/hr overnight with good PO intake per patient. - discontinue IVF - soft diet, advance as tolerated - Phenergan PRN for nausea - consider  repeating abdominal xray if continued pain  Acidosis: NAGMA due to profuse diarrhea and vomiting. BMP 128/3.9/101/13/42/6.18<111 Anticipate acidosis will improve as diarrhea subsides and volume status improves. Na improving to 138 and Cr 0.86 this am 9/27. CO2 slowly improving to 18. - monitor with BMP - encourage good PO intake   Acute renal failure: Cr on admission 6.18, baseline Cr of ~0.9. Highly likely this is prerenal as patient was dehydrated on exam and has known cause for being volume depleted. Patient presented with decreased urination, has urinated since admitted, total UOP 1553mlast 24 hours although 4 unmeasured outputs. Net +9.6L. No signs of volume overload on exam. Cr 4.91 > 1.89 > 0.86 9/27. Back to admission weight, 250lbs. 256 in 04/2016 - encourage good PO intake - Monitor with BMP daily - monitor strict I/O, daily weights  Borderline Prolonged QT: stable Qtc noted to be 482 on admission. - Holding agents that could prolong his QT, such as Zofran - on telemetry  History of CAD: s/p stent 2 in 2002 and 2006 and quadruple CABG in 2015. No chest pain and EKG negative for ACS. Presented with marked hypotension (65/41) in the setting of persistent diarrhea. Trop x2 obtained yesterday to ensure low BP weren't straining his heart and were negative x2. No chest pain. BP improving with aggressive IV hydration. - Holding Metoprolol 50 mg twice a day. - Continue home aspirin, Plavix, metoprolol and atorvastatin.  Hyperglycemia: BMP glucose 111 on admission. A1c 5.2. CBG 110 this am 9/25. -continue to follow per PCP outpatient  Hypokalemia 3.3 this am 9/26. - added 2025mto IVF  Depression/anxiety: Stable. Denies SI/HI -Continue home Wellbutrin and nefazodone  FEN/GI: thickened liquids Prophylaxis: Heparin  Disposition: continue inpatient management  to ensure adequate PO hydration  Subjective:  Pt feels ok today, denies abdominal pain, vomiting. Continues to endorse  diarrheal episodes, no change. Able to stand and walk without dizziness or assistance. Understands treatment plan for today.  Objective: Temp:  [97.3 F (36.3 C)-99.3 F (37.4 C)] 98 F (36.7 C) (09/27 0528) Pulse Rate:  [74-88] 86 (09/27 0605) Resp:  [17-24] 20 (09/27 0605) BP: (129-181)/(73-81) 149/76 (09/27 0605) SpO2:  [97 %-100 %] 97 % (09/27 0605) Weight:  [250 lb 3.6 oz (113.5 kg)] 250 lb 3.6 oz (113.5 kg) (09/27 0528)  Physical Exam: General: appears comfortable lying in bed, in NAD Cardiovascular: RRR, no murmur Respiratory: CTA bilaterally, no wheezes/crackles Abdomen: non tender to palpation, hyperactive BS, distended Extremities: WWP, no LE edema, dorsal pedal pulses appreciated  Laboratory:  Recent Labs Lab 12/03/16 0615 12/04/16 0408 12/05/16 0353  WBC 11.4* 7.5 6.1  HGB 12.6* 11.9* 11.7*  HCT 37.5* 34.8* 35.2*  PLT 175 144* 131*    Recent Labs Lab 12/02/16 2300 12/03/16 0615 12/04/16 0408 12/05/16 0353  NA 128* 129* 135 138  K 3.9 3.8 3.3* 3.6  CL 101 105 115* 116*  CO2 13* 12* 14* 18*  BUN 42* 42* 29* 10  CREATININE 6.18* 4.91* 1.89* 0.86  CALCIUM 8.9 7.6* 7.5* 7.7*  PROT 8.2* 6.8 6.6  --   BILITOT 1.0 0.9 0.7  --   ALKPHOS 73 57 54  --   ALT 42 33 29  --   AST 42* 30 27  --   GLUCOSE 111* 100* 110* 95   9/25 Trop x2 - negative GI panel - positive for Cryptosporidium C diff - negative Lactic acid - 1.01 HIV non reactive  Imaging/Diagnostic Tests:  CXR 9/25 FINDINGS: Unchanged cardiomegaly. Status post median sternotomy. No focal airspace consolidation or pulmonary edema. No pneumothorax or sizable pleural effusion. IMPRESSION: Unchanged cardiomegaly without active cardiopulmonary disease.  [name], DO 12/05/2016, 7:29 AM PGY-1, [place] Intern pager: [phone], text pages welcome

## 2016-12-05 NOTE — Progress Notes (Signed)
INFECTIOUS DISEASE PROGRESS NOTE  ID: Gregory Hansen is a 66 y.o. male with  Principal Problem:   Sepsis ([place]) Active Problems:   S/P CABG x 4   Acute kidney injury ([place])   Aortic atherosclerosis ([facility])   Hyponatremia   Hypoalbuminemia   Metabolic acidosis   Hypovolemia with active loss of fluid   Diarrhea due to cryptosporidium ([facility])   Hypotension  Subjective: Unable to count the number of times he has moved his bowels. Mostly after eating. Feels they are becoming more solid.  Eating well.  No abd pain.    Abtx:  Anti-infectives    Start     Dose/Rate Route Frequency Ordered Stop   12/03/16 1400  piperacillin-tazobactam (ZOSYN) IVPB 2.25 g  Status:  Discontinued     2.25 g 100 mL/hr over 30 Minutes Intravenous Every 8 hours 12/03/16 0330 12/03/16 1248   12/03/16 0330  piperacillin-tazobactam (ZOSYN) IVPB 3.375 g     3.375 g 100 mL/hr over 30 Minutes Intravenous  Once 12/03/16 0330 12/03/16 0456      Medications:  Scheduled: . aspirin  81 mg Oral QPM  . atorvastatin  40 mg Oral Daily  . buPROPion  300 mg Oral Daily  . clopidogrel  75 mg Oral Daily  . feeding supplement (ENSURE ENLIVE)  237 mL Oral BID BM  . fluticasone  1 spray Each Nare QHS  . heparin  5,000 Units Subcutaneous Q8H  . nefazodone  300 mg Oral Daily  . pantoprazole  40 mg Oral Daily  . potassium chloride  40 mEq Oral Once  . sodium chloride flush  3 mL Intravenous Q12H    Objective: Vital signs in last 24 hours: Temp:  [97.3 F (36.3 C)-99.3 F (37.4 C)] 98 F (36.7 C) (09/27 0528) Pulse Rate:  [78-88] 86 (09/27 0605) Resp:  [17-20] 20 (09/27 0605) BP: (129-181)/(73-81) 149/76 (09/27 0605) SpO2:  [97 %-100 %] 97 % (09/27 0605) Weight:  [113.5 kg (250 lb 3.6 oz)] 113.5 kg (250 lb 3.6 oz) (09/27 0528)   General appearance: alert, cooperative and no distress Resp: clear to auscultation bilaterally Cardio: regular rate and rhythm GI: normal findings: bowel sounds normal and soft,  non-tender and abnormal findings:  distended  Lab Results  Recent Labs  12/04/16 0408 12/05/16 0353  WBC 7.5 6.1  HGB 11.9* 11.7*  HCT 34.8* 35.2*  NA 135 138  K 3.3* 3.6  CL 115* 116*  CO2 14* 18*  BUN 29* 10  CREATININE 1.89* 0.86   Liver Panel  Recent Labs  12/03/16 0615 12/04/16 0408  PROT 6.8 6.6  ALBUMIN 2.9* 2.7*  AST 30 27  ALT 33 29  ALKPHOS 57 54  BILITOT 0.9 0.7   Sedimentation Rate No results for input(s): ESRSEDRATE in the last 72 hours. C-Reactive Protein No results for input(s): CRP in the last 72 hours.  Microbiology: Recent Results (from the past 240 hour(s))  C difficile quick scan w PCR reflex     Status: None   Collection Time: 12/02/16 11:49 PM  Result Value Ref Range Status   C Diff antigen NEGATIVE NEGATIVE Final   C Diff toxin NEGATIVE NEGATIVE Final   C Diff interpretation No C. difficile detected.  Final  Gastrointestinal Panel by PCR , Stool     Status: Abnormal   Collection Time: 12/02/16 11:49 PM  Result Value Ref Range Status   Campylobacter species NOT DETECTED NOT DETECTED Final   Plesimonas shigelloides NOT DETECTED NOT DETECTED  Final   Salmonella species NOT DETECTED NOT DETECTED Final   Yersinia enterocolitica NOT DETECTED NOT DETECTED Final   Vibrio species NOT DETECTED NOT DETECTED Final   Vibrio cholerae NOT DETECTED NOT DETECTED Final   Enteroaggregative E coli (EAEC) NOT DETECTED NOT DETECTED Final   Enteropathogenic E coli (EPEC) NOT DETECTED NOT DETECTED Final   Enterotoxigenic E coli (ETEC) NOT DETECTED NOT DETECTED Final   Shiga like toxin producing E coli (STEC) NOT DETECTED NOT DETECTED Final   Shigella/Enteroinvasive E coli (EIEC) NOT DETECTED NOT DETECTED Final   Cryptosporidium DETECTED (A) NOT DETECTED Final   Cyclospora cayetanensis NOT DETECTED NOT DETECTED Final   Entamoeba histolytica NOT DETECTED NOT DETECTED Final   Giardia lamblia NOT DETECTED NOT DETECTED Final   Adenovirus F40/41 NOT DETECTED NOT  DETECTED Final   Astrovirus NOT DETECTED NOT DETECTED Final   Norovirus GI/GII NOT DETECTED NOT DETECTED Final   Rotavirus A NOT DETECTED NOT DETECTED Final   Sapovirus (I, II, IV, and V) NOT DETECTED NOT DETECTED Final  Blood culture (routine x 2)     Status: None (Preliminary result)   Collection Time: 12/03/16 12:00 AM  Result Value Ref Range Status   Specimen Description BLOOD LEFT ANTECUBITAL  Final   Special Requests IN PEDIATRIC BOTTLE Blood Culture adequate volume  Final   Culture NO GROWTH 2 DAYS  Final   Report Status PENDING  Incomplete  Blood culture (routine x 2)     Status: None (Preliminary result)   Collection Time: 12/03/16 12:08 AM  Result Value Ref Range Status   Specimen Description BLOOD LEFT HAND  Final   Special Requests   Final    BOTTLES DRAWN AEROBIC AND ANAEROBIC Blood Culture adequate volume   Culture NO GROWTH 2 DAYS  Final   Report Status PENDING  Incomplete    Studies/Results: No results found.   Assessment/Plan: Gastroenteritis Cryptosporidium  Metabolic acidosis AKI  Total days of antibiotics: off  Cr back to normal, no gap. CO2 improving.  2 BM charted in last 24h, not sure how accurate this is.  Hypertensive episodes now.   My great appreciation to FPTS for their excellent care.  Available as needed.           Bobby Rumpf MD, FACP Infectious Diseases (pager) [phone] [email or web address] 12/05/2016, 2:03 PM  LOS: 2 days

## 2016-12-06 LAB — BASIC METABOLIC PANEL
ANION GAP: 4 — AB (ref 5–15)
BUN: <5 mg/dL — ABNORMAL LOW (ref 6–20)
CALCIUM: 7.8 mg/dL — AB (ref 8.9–10.3)
CO2: 18 mmol/L — AB (ref 22–32)
Chloride: 112 mmol/L — ABNORMAL HIGH (ref 101–111)
Creatinine, Ser: 0.72 mg/dL (ref 0.61–1.24)
Glucose, Bld: 103 mg/dL — ABNORMAL HIGH (ref 65–99)
Potassium: 3.7 mmol/L (ref 3.5–5.1)
Sodium: 134 mmol/L — ABNORMAL LOW (ref 135–145)

## 2016-12-06 LAB — CBC
HCT: 34.2 % — ABNORMAL LOW (ref 39.0–52.0)
Hemoglobin: 11.6 g/dL — ABNORMAL LOW (ref 13.0–17.0)
MCH: 32 pg (ref 26.0–34.0)
MCHC: 33.9 g/dL (ref 30.0–36.0)
MCV: 94.2 fL (ref 78.0–100.0)
PLATELETS: 118 10*3/uL — AB (ref 150–400)
RBC: 3.63 MIL/uL — AB (ref 4.22–5.81)
RDW: 13.7 % (ref 11.5–15.5)
WBC: 7.2 10*3/uL (ref 4.0–10.5)

## 2016-12-06 MED ORDER — METOPROLOL TARTRATE 12.5 MG HALF TABLET
12.5000 mg | ORAL_TABLET | Freq: Two times a day (BID) | ORAL | Status: DC
  Administered 2016-12-06 – 2016-12-07 (×3): 12.5 mg via ORAL
  Filled 2016-12-06 (×3): qty 1

## 2016-12-06 MED ORDER — RAMIPRIL 2.5 MG PO CAPS: 10.0000 mg | ORAL_CAPSULE | Freq: Two times a day (BID) | ORAL | Status: DC

## 2016-12-06 NOTE — Progress Notes (Signed)
Family Medicine Teaching Service Daily Progress Note Intern Pager: [phone]  Patient name: Gregory Hansen Medical record number: [id number] Date of birth: 04/19/1950 Age: 66 y.o. Gender: male  Primary Care Provider: Patient, No Pcp Per Consultants: Pharmacy, Infectious Disease Code Status: Full  Pt Overview and Major Events to Date:  9/24 - admitted for intractable diarrhea, sepsis criteria met 9/25 - Zosyn d/ced, ID consult, BP improving 9/27 - IVF d/ced  Assessment and Plan: Gregory Hansen is a 66 y.o. male presenting with intractable diarrhea. PMH is significant for HTN, CAD status post stent 2 and quadruple CABG, depression, anxiety, GERD and ankylosing spondylitis.   Sepsis 2/2 Diarrhea with associated nausea/vomiting: Initially meeting sepsis criteria. S/p 4.5L NS bolus. GI panel this admission positive for cryptosporidium that can relapse after initial treatment, which was on previous admission, d/c on 09/16. Symptoms may persist for 10-14 days after initial incubation. Could also consider post-infectious IBS per ID but too early after initial presentation to tell. Blood cx NGx2d. ID consulted and does not recommend treatment with anti-protozoal at this time and continue to hydrate aggressively with supportive management. S/p 1 dose zosyn (9/25). - discontinue IVF 09/27 - soft diet, advance as tolerated - Phenergan PRN for nausea- had one episode yesterday, will go back to liquid diet for now and see how he tolerates - consider repeating abdominal xray if continued pain  Acidosis: NAGMA due to profuse diarrhea and vomiting. BMP 128/3.9/101/13/42/6.18<111 Anticipate acidosis will improve as diarrhea subsides and volume status improves. Na improving to 138 and Cr 0.86 this am 9/27. CO2 slowly improving to 18. - monitor with BMP - encourage good PO intake   Acute renal failure, resolved: Cr on admission 6.18, baseline Cr of ~0.9. Highly likely was prerenal as patient was dehydrated  on exam and has known cause for being volume depleted. Cr 4.91 > 1.89 > 0.72on  9/28. Weight, 246lbs today. 256 in 04/2016 - encourage good PO intake - Monitor with BMP daily - monitor strict I/O, daily weights  Borderline Prolonged QT: stable Qtc noted to be 482 on admission. - Holding agents that could prolong his QT, such as Zofran - on telemetry  History of CAD: s/p stent 2 in 2002 and 2006 and quadruple CABG in 2015. No chest pain and EKG negative for ACS. Presented with marked hypotension (65/41) in the setting of persistent diarrhea. Trop x2 obtained yesterday to ensure low BP weren't straining his heart and were negative x2. No chest pain. BP improving with aggressive IV hydration. - Holding Metoprolol 50 mg twice a day. - Continue home aspirin, Plavix, metoprolol and atorvastatin.  Hyperglycemia: BMP glucose 111 on admission. A1c 5.2. CBG 110 this am 9/25. -continue to follow per PCP outpatient  Hypokalemia 3.3 this am 9/26. - added 5mq to IVF  Depression/anxiety: Stable. Denies SI/HI -Continue home Wellbutrin and nefazodone  FEN/GI: thickened liquids Prophylaxis: Heparin  Disposition: continue inpatient management to ensure no more vomiting  Subjective:  Pt feels ok today, reports that he had an episode of vomiting yesterday with absence of nausea afterwards. He is still having diarrhea about 8 times in a day. Trying to drink plenty.  Objective: Temp:  [98.7 F (37.1 C)-99 F (37.2 C)] 98.7 F (37.1 C) (09/28 0542) Pulse Rate:  [61-67] 67 (09/28 0542) Resp:  [14-17] 14 (09/28 0542) BP: (170)/(81-98) 170/98 (09/28 0542) SpO2:  [97 %-98 %] 97 % (09/28 0542) Weight:  [246 lb 14.6 oz (112 kg)] 246 lb 14.6 oz (112 kg) (  09/28 0542)  Physical Exam: General: walking around room, in NAD Cardiovascular: RRR, no murmur Respiratory: CTA bilaterally, no wheezes/crackles Abdomen: non tender to palpation, hyperactive BS, moderately distended Extremities: WWP, no LE  edema, dorsal pedal pulses appreciated  Laboratory:  Recent Labs Lab 12/04/16 0408 12/05/16 0353 12/06/16 0348  WBC 7.5 6.1 7.2  HGB 11.9* 11.7* 11.6*  HCT 34.8* 35.2* 34.2*  PLT 144* 131* 118*    Recent Labs Lab 12/02/16 2300 12/03/16 0615 12/04/16 0408 12/05/16 0353 12/06/16 0348  NA 128* 129* 135 138 134*  K 3.9 3.8 3.3* 3.6 3.7  CL 101 105 115* 116* 112*  CO2 13* 12* 14* 18* 18*  BUN 42* 42* 29* 10 <5*  CREATININE 6.18* 4.91* 1.89* 0.86 0.72  CALCIUM 8.9 7.6* 7.5* 7.7* 7.8*  PROT 8.2* 6.8 6.6  --   --   BILITOT 1.0 0.9 0.7  --   --   ALKPHOS 73 57 54  --   --   ALT 42 33 29  --   --   AST 42* 30 27  --   --   GLUCOSE 111* 100* 110* 95 103*   9/25 Trop x2 - negative GI panel - positive for Cryptosporidium C diff - negative Lactic acid - 1.01 HIV non reactive  Imaging/Diagnostic Tests:  CXR 9/25 FINDINGS: Unchanged cardiomegaly. Status post median sternotomy. No focal airspace consolidation or pulmonary edema. No pneumothorax or sizable pleural effusion. IMPRESSION: Unchanged cardiomegaly without active cardiopulmonary disease.  Gregory Jeanmarie, [place], DO 12/06/2016, 6:44 AM PGY-1, [place] Intern pager: [phone], text pages welcome

## 2016-12-06 NOTE — Progress Notes (Signed)
CM scheduled pt's Hospital followup appointment at the [facility] for 01/10/2017 at 9 am with Molli Barrows NP. Noted on AVS, pt made aware per CM. Whitman Hero RN,BSN,CM

## 2016-12-07 DIAGNOSIS — E860 Dehydration: Secondary | ICD-10-CM

## 2016-12-07 LAB — BASIC METABOLIC PANEL
ANION GAP: 7 (ref 5–15)
CHLORIDE: 107 mmol/L (ref 101–111)
CO2: 20 mmol/L — ABNORMAL LOW (ref 22–32)
Calcium: 7.7 mg/dL — ABNORMAL LOW (ref 8.9–10.3)
Creatinine, Ser: 0.66 mg/dL (ref 0.61–1.24)
GFR calc Af Amer: >60 mL/min (ref >60)
GFR calc non Af Amer: >60 mL/min (ref >60)
Glucose, Bld: 88 mg/dL (ref 65–99)
POTASSIUM: 3.3 mmol/L — AB (ref 3.5–5.1)
SODIUM: 134 mmol/L — AB (ref 135–145)

## 2016-12-07 MED ORDER — POTASSIUM CHLORIDE CRYS ER 20 MEQ PO TBCR
40.0000 meq | EXTENDED_RELEASE_TABLET | Freq: Once | ORAL | Status: AC
  Administered 2016-12-07: 40 meq via ORAL
  Filled 2016-12-07: qty 2

## 2016-12-07 MED ORDER — METOPROLOL SUCCINATE ER 25 MG PO TB24: 25.0000 mg | ORAL_TABLET | Freq: Every day | ORAL | 0 refills | Status: DC

## 2016-12-07 MED ORDER — POTASSIUM CHLORIDE CRYS ER 20 MEQ PO TBCR: 40.0000 meq | EXTENDED_RELEASE_TABLET | Freq: Once | ORAL | Status: DC

## 2016-12-07 MED ORDER — POTASSIUM CHLORIDE CRYS ER 20 MEQ PO TBCR: 40.0000 meq | EXTENDED_RELEASE_TABLET | Freq: Every day | ORAL | Status: AC

## 2016-12-07 NOTE — Progress Notes (Signed)
Family Medicine Teaching Service Daily Progress Note Intern Pager: [phone]  Patient name: Gregory Hansen Medical record number: [id number] Date of birth: 03-Aug-1950 Age: 66 y.o. Gender: male  Primary Care Provider: Patient, No Pcp Per Consultants: Pharmacy, Infectious Disease Code Status: Full  Pt Overview and Major Events to Date:  9/24 - admitted for intractable diarrhea, sepsis criteria met 9/25 - Zosyn d/ced, ID consult, BP improving 9/27 - IVF d/ced  Assessment and Plan: Gregory Hansen is a 66 y.o. male presenting with intractable diarrhea. PMH is significant for HTN, CAD status post stent 2 and quadruple CABG, depression, anxiety, GERD and ankylosing spondylitis.   Sepsis 2/2 Diarrhea with associated nausea/vomiting, improving: Initially meeting sepsis criteria. S/p 4.5L NS bolus. GI panel this admission positive for cryptosporidium that can relapse after initial treatment, which was on previous admission, d/c on 09/16. Symptoms may persist for 10-14 days after initial incubation. Could also consider post-infectious IBS per ID but too early after initial presentation to tell. Blood cx NGx3d. ID consulted and does not recommend treatment with anti-protozoal at this time and continue to hydrate aggressively with supportive management, has signed off. S/p 1 dose zosyn (9/25). IVF discontinued 09/27 and lots of PO intake encouraged. Did have one episode of vomiting yesterday, now on liquid diet. However, if able to keep hydration status up, can d/c likely today. - soft diet, advance as tolerated - Phenergan PRN for nausea - consider repeating abdominal xray if continued pain  Acidosis, improving: NAGMA due to profuse diarrhea and vomiting. BMP 128/3.9/101/13/42/6.18<111 on admission. Acidosis slowly improving, CO2 this am 9/29 20. Improving as hydration improves, anticipate further normalization as diarrhea subsides. Na down to 134 today 9/29 from 138 9/27. Cr 0.66 this am 9/29.  -  monitor with BMP - encourage good PO intake   Acute renal failure, resolved: Cr on admission 6.18, baseline Cr of ~0.9. Highly likely was prerenal as patient was dehydrated on exam and has known cause for being volume depleted. Cr 4.91 > 1.89 > 0.66 on 9/29. Back up to previous weight, 257lbs today. 256 in 04/2016. - encourage good PO intake - Monitor with BMP daily - monitor strict I/O, daily weights  Borderline Prolonged QT: stable Qtc noted to be 482 on admission. - Holding agents that could prolong his QT, such as Zofran - on telemetry  History of CAD, stable: s/p stent 2 in 2002 and 2006 and quadruple CABG in 2015. No chest pain and EKG negative for ACS. Presented with marked hypotension (65/41) in the setting of persistent diarrhea. Trop x2 obtained yesterday to ensure low BP weren't straining his heart and were negative x2. No chest pain. BP improving with aggressive IV hydration. - Holding Metoprolol 50 mg twice a day. - Continue home aspirin, Plavix, metoprolol and atorvastatin.  Hyperglycemia: BMP glucose 111 on admission. A1c 5.2. CBG 88 this am 9/29. -continue to follow per PCP outpatient  Hypokalemia 3.3 this am 9/29. - replete with 44mq Kdur  Depression/anxiety: Stable. Denies SI/HI -Continue home Wellbutrin and nefazodone  FEN/GI: thickened liquids Prophylaxis: Heparin  Disposition: discharge home due to good PO intake, resolution of AKI  Subjective:  Pt feels better today, did vomit yesterday but thinks it was due to the sherbet he ate right before. ~14 BM within the last 48 hours. Endorsing good PO hydration and feels well enough to go home today.   Objective: Temp:  [97.6 F (36.4 C)-98.4 F (36.9 C)] 98.4 F (36.9 C) (09/29 0549) Pulse Rate:  [  67-75] 67 (09/29 0549) Resp:  [18] 18 (09/29 0549) BP: (136-170)/(66-87) 136/66 (09/29 0549) SpO2:  [96 %-100 %] 96 % (09/29 0549) Weight:  [257 lb 6.4 oz (116.8 kg)] 257 lb 6.4 oz (116.8 kg) (09/29  0549)  Physical Exam: General: lying in bed, able to get up and walk around room unassisted. In NAD. Cardiovascular: RRR, no murmur Respiratory: CTA bilaterally, no wheezes/crackles Abdomen: non tender to palpation, normoactive BS, distended abdomen Extremities: WWP, no LE edema, dorsal pulses appreciated  Laboratory:  Recent Labs Lab 12/04/16 0408 12/05/16 0353 12/06/16 0348  WBC 7.5 6.1 7.2  HGB 11.9* 11.7* 11.6*  HCT 34.8* 35.2* 34.2*  PLT 144* 131* 118*    Recent Labs Lab 12/02/16 2300 12/03/16 0615 12/04/16 0408 12/05/16 0353 12/06/16 0348 12/07/16 0359  NA 128* 129* 135 138 134* 134*  K 3.9 3.8 3.3* 3.6 3.7 3.3*  CL 101 105 115* 116* 112* 107  CO2 13* 12* 14* 18* 18* 20*  BUN 42* 42* 29* 10 <5* <5*  CREATININE 6.18* 4.91* 1.89* 0.86 0.72 0.66  CALCIUM 8.9 7.6* 7.5* 7.7* 7.8* 7.7*  PROT 8.2* 6.8 6.6  --   --   --   BILITOT 1.0 0.9 0.7  --   --   --   ALKPHOS 73 57 54  --   --   --   ALT 42 33 29  --   --   --   AST 42* 30 27  --   --   --   GLUCOSE 111* 100* 110* 95 103* 88   9/25 Trop x2 - negative GI panel - positive for Cryptosporidium C diff - negative Lactic acid - 1.01 HIV non reactive  Imaging/Diagnostic Tests:  CXR 9/25 FINDINGS: Unchanged cardiomegaly. Status post median sternotomy. No focal airspace consolidation or pulmonary edema. No pneumothorax or sizable pleural effusion. IMPRESSION: Unchanged cardiomegaly without active cardiopulmonary disease.  [name], DO 12/07/2016, 7:05 AM PGY-1, [place] Intern pager: [phone], text pages welcome

## 2016-12-07 NOTE — Discharge Instructions (Signed)
You were readmitted for severe dehydration in the setting of continued diarrhea with poor oral hydration. You were treated with aggressive IV hydration and your kidney function improved. Prior to discharge, your kidney function normalized and you were observed on oral hydration to ensure you would be stable for discharge.  Make sure to continue to drink plenty of fluids and avoid dairy products until your diarrhea resolves.  Follow up with our clinic in one week (12/11/16 at 2:30pm) with Dr. Ky Barban at 2:30pm.

## 2016-12-07 NOTE — Discharge Summary (Signed)
[facility] Discharge Summary  Patient name: Gregory Hansen Medical record number: [id number] Date of birth: 03-26-1950 Age: 66 y.o. Gender: male Date of Admission: 12/02/2016  Date of Discharge: 12/07/2016 Admitting Physician: Blane Ohara McDiarmid, MD  Primary Care Provider: Patient, No Pcp Per Consultants: Infectious Disease  Indication for Hospitalization: AKI in the setting of severe dehydration due to persistent diarrhea  Discharge Diagnoses/Problem List:  Sepsis 2/2 Diarrhea with associated nausea/vomiting, resolved Acidosis, improving Acute renal failure, resolved Borderline prolonged QT, stable History of CAD, stable Hyperglycemia, resolved Hypokalemia, resolved Depresson/anxiety, stable  Disposition: Home  Discharge Condition: Improved  Discharge Exam:  General: lying in bed, able to get up and walk around room unassisted. In NAD. Cardiovascular: RRR, no murmur Respiratory: CTA bilaterally, no wheezes/crackles Abdomen: non tender to palpation, normoactive BS, distended abdomen Extremities: WWP, no LE edema, dorsal pulses appreciated  Brief Hospital Course:  Patient readmitted for relapse in persistent diarrhea 2/2 Cryptosporidium and unsuccessful oral hydration after discharge on 09/16. PMH is significant for HTN, CAD status post stent 2 and quadruple CABG, depression, anxiety, GERD and ankylosing spondylitis. Patient endorsed initial improvement in symptoms for about a week after previous discharge but had relapse of diarrhea and vomiting a few days before this presentation. Cr on admission 6.18 indicating severe dehydration. Also with WBC 18.5, T96.35F and marked hypotension (81/66) meeting SIRS criteria. Aggressively hydrated with IVF, s/p 4.5L bolus, with subsequent normalization of acidosis (BMP 128/3.9/101/13/42/6.18<111 on admission) and resolution of AKI (Cr 0.66 on discharge). Repeat GI panel positive for Cryptosporidium and symptoms likely  due to continuation of previous illness as symptoms can last up to 14 days with relapse after initial improvement. C diff negative. Patient continued to endorse loose bowel movements throughout admission but with resolution of vomiting and decrease in amount of BMs. Infectious Disease consulted this admission and agreed with supportive management. IVF discontinued on 09/27 and watched for toleration of adequate PO intake. Home BP meds held in light of marked hypotension, restarted on discharge with decrease in Metoprolol dose. Consider reevaluation for BP management on follow up. Will also need repeat electrolyte labs to ensure continued normalization.  Issues for Follow Up:  1. Ensure patient is maintaining adequate oral hydration. Assess for resolution of symptoms. 2. Recheck labs: 1. K - Suggest checking as patient had hypokalemia (3.3) resolved with repletion.  2. Cr - AKI resolved with hydration but ensure continued normalization as patient continues with PO intake. 3. Medication changes: 1. Home Metoprolol dosage (100mg  daily) decreased during this admission due to marked hypotension on admission. Remained normotensive on decreased dose (25mg  daily). Consider restarting home dose as diarrhea and volume status improves. 2. Given 7 days of Kdur 44meq daily due to hypokalemia throughout admission, likely 2/2 volume loss.  3. No other medication changes.  Significant Procedures: None  Significant Labs and Imaging:   Recent Labs Lab 12/04/16 0408 12/05/16 0353 12/06/16 0348  WBC 7.5 6.1 7.2  HGB 11.9* 11.7* 11.6*  HCT 34.8* 35.2* 34.2*  PLT 144* 131* 118*    Recent Labs Lab 12/02/16 2300 12/03/16 0615 12/04/16 0408 12/05/16 0353 12/06/16 0348 12/07/16 0359  NA 128* 129* 135 138 134* 134*  K 3.9 3.8 3.3* 3.6 3.7 3.3*  CL 101 105 115* 116* 112* 107  CO2 13* 12* 14* 18* 18* 20*  GLUCOSE 111* 100* 110* 95 103* 88  BUN 42* 42* 29* 10 <5* <5*  CREATININE 6.18* 4.91* 1.89* 0.86  0.72 0.66  CALCIUM 8.9  7.6* 7.5* 7.7* 7.8* 7.7*  ALKPHOS 73 57 54  --   --   --   AST 42* 30 27  --   --   --   ALT 42 33 29  --   --   --   ALBUMIN 3.6 2.9* 2.7*  --   --   --    CXR 9/25 FINDINGS: Unchanged cardiomegaly. Status post median sternotomy. No focal airspace consolidation or pulmonary edema. No pneumothorax or sizable pleural effusion. IMPRESSION: Unchanged cardiomegaly without active cardiopulmonary disease.  Results/Tests Pending at Time of Discharge: None  Discharge Medications:  Allergies as of 12/07/2016   No Known Allergies     Medication List    TAKE these medications   aspirin 81 MG tablet Take 81 mg by mouth every evening.   atorvastatin 80 MG tablet Commonly known as:  LIPITOR TAKE 40MG  BY MOUTH ONCE  DAILY   buPROPion 300 MG 24 hr tablet Commonly known as:  WELLBUTRIN XL Take 300 mg by mouth daily.   clopidogrel 75 MG tablet Commonly known as:  PLAVIX Take 75 mg by mouth daily.   desonide 0.05 % cream Commonly known as:  DESOWEN Apply 1 application topically daily as needed (to face).   fluticasone 50 MCG/ACT nasal spray Commonly known as:  FLONASE Place 2 sprays into both nostrils daily.   KLS ALLER-TEC 10 MG tablet Generic drug:  cetirizine Take 10 mg by mouth daily.   metoprolol succinate 25 MG 24 hr tablet Commonly known as:  TOPROL XL Take 1 tablet (25 mg total) by mouth daily. What changed:  medication strength  how much to take  additional instructions   multivitamin with minerals Tabs tablet Take 1 tablet by mouth daily.   nefazodone 200 MG tablet Commonly known as:  SERZONE Take 300 mg by mouth daily.   nitroGLYCERIN 0.4 MG SL tablet Commonly known as:  NITROSTAT Place 0.4 mg under the tongue every 5 (five) minutes as needed for chest pain. Up to 3 doses   pantoprazole 40 MG tablet Commonly known as:  PROTONIX TAKE 1 TABLET BY MOUTH  DAILY   prednisoLONE acetate 1 % ophthalmic suspension Commonly known as:   PRED FORTE Place 1 drop into both eyes daily as needed.   ramipril 10 MG capsule Commonly known as:  ALTACE TAKE 1 CAPSULE BY MOUTH TWO TIMES DAILY   vitamin B-12 1000 MCG tablet Commonly known as:  CYANOCOBALAMIN Take 1,000 mcg by mouth daily.            Discharge Care Instructions        Start     Ordered   12/07/16 1015  POTASSIUM CHLORIDE CRYS ER 20 MEQ PO TBCR  Daily     12/07/16 1010   12/07/16 0000  metoprolol succinate (TOPROL XL) 25 MG 24 hr tablet  Daily     12/07/16 1010      Discharge Instructions: Please refer to Patient Instructions section of EMR for full details.  Patient was counseled important signs and symptoms that should prompt return to medical care, changes in medications, dietary instructions, activity restrictions, and follow up appointments.   Follow-Up Appointments: Follow-up [place]. Go on 01/10/2017.   Why:   Establish care appointment scheduled for 01/10/2017 at 9 am with Molli Barrows NP Contact information: [place] [phone] [place] [address] [place], [place], DO. Go on 12/11/2016.  Specialty:  Family Medicine Why:  Follow up with Dr. Ky Barban on 12/11/2016 at 2:30pm for hospital f/u Contact information: [address] [place] [address] [place], [place], DO 12/07/2016, Carlisle Cater PM PGY-1, [place]

## 2016-12-08 LAB — CULTURE, BLOOD (ROUTINE X 2)
CULTURE: NO GROWTH
CULTURE: NO GROWTH
Special Requests: ADEQUATE
Special Requests: ADEQUATE

## 2016-12-10 NOTE — Progress Notes (Signed)
   Subjective:   Patient ID: Gregory Hansen    DOB: 1950/04/05, 66 y.o. male   MRN: [id number]  Gregory Hansen is a 66 y.o. male with a history of CAD here for   Recent admission for persistent diarrhea, AKI, acidosis - GI panel positive for Cryptosporidium - met sepsis criteria, marked hypotension - Recommendations on discharge: recheck electrolytes (K), Cr. Recheck BP as metoprolol dose decreased. - amount of BMs per day: about 5, brown, not yellow like before. - Denies vomiting, lightheadedness, fainting. Occasionally has belly cramping. Has some blurry vision but attributes this to not having seen eye doctor recently. Also thinks belly is more distended than before diarrheal illness but improved since discharge. - Tolerating somewhat regular diet. Avoiding fried, fatty foods and dairy. Did have smoothie this morning and tolerated well. - Was supposed to get Kdur on discharge, but did not receive any.  Review of Systems:  Per HPI.   [place]: reviewed. Smoking status reviewed. Medications reviewed.  Objective:   BP 124/74   Pulse 92   Temp 98.1 F (36.7 C) (Oral)   Ht _0  (1.88 m)   Wt 242 lb (109.8 kg)   SpO2 98%   BMI 31.07 kg/m  Vitals and nursing note reviewed.  General: well nourished, well developed, in no acute distress with non-toxic appearance HEENT: normocephalic, atraumatic, moist mucous membranes, poor dentition Neck: supple, non-tender without lymphadenopathy CV: regular rate and rhythm without murmurs, rubs, or gallops, no lower extremity edema Lungs: clear to auscultation bilaterally with normal work of breathing Abdomen: soft, non-tender, slightly distended, no masses or organomegaly palpable, normoactive bowel sounds Skin: warm, dry, no rashes or lesions Extremities: warm and well perfused, normal tone MSK: ROM grossly intact, gait normal Neuro: Alert and oriented, speech normal   Assessment & Plan:   Diarrhea due to cryptosporidium ([name]) Follow up for  recent admission, discharged 12/07/2016. Aggressive IV hydration inpatient with resolution of AKI, metabolic acidosis prior to discharge. Diarrhea improving. Maintaining good oral hydration. - Recheck electrolytes, Cr today.  HYPERTENSION, BENIGN Home Metoprolol dose of 189m daily was decreased in the hospital due to marked hypotension meeting sepsis criteria. Decreased to 266mdaily and was normotensive at discharge. BP 124/74 today. Remain on current dose (2518mand reevaluate in a month if patient chooses to remain with [facility]. Otherwise, should follow up at previously scheduled visit.  Orders Placed This Encounter  Procedures  . Basic Metabolic Panel   No orders of the defined types were placed in this encounter.   AliRory PercyO PGY-1, [facility] 12/11/2016 3:47 PM

## 2016-12-11 ENCOUNTER — Encounter: Payer: Self-pay | Admitting: Family Medicine

## 2016-12-11 ENCOUNTER — Ambulatory Visit (INDEPENDENT_AMBULATORY_CARE_PROVIDER_SITE_OTHER): Payer: Medicare Other | Admitting: Family Medicine

## 2016-12-11 VITALS — BP 124/74 | HR 92 | Temp 98.1°F | Ht 74.0 in | Wt 242.0 lb

## 2016-12-11 DIAGNOSIS — A072 Cryptosporidiosis: Secondary | ICD-10-CM

## 2016-12-11 DIAGNOSIS — I2510 Atherosclerotic heart disease of native coronary artery without angina pectoris: Secondary | ICD-10-CM | POA: Diagnosis not present

## 2016-12-11 DIAGNOSIS — I10 Essential (primary) hypertension: Secondary | ICD-10-CM

## 2016-12-11 DIAGNOSIS — A09 Infectious gastroenteritis and colitis, unspecified: Secondary | ICD-10-CM | POA: Diagnosis present

## 2016-12-11 NOTE — Assessment & Plan Note (Addendum)
Follow up for recent admission, discharged 12/07/2016. Aggressive IV hydration inpatient with resolution of AKI, metabolic acidosis prior to discharge. Diarrhea improving. Maintaining good oral hydration. - Recheck electrolytes, Cr today.

## 2016-12-11 NOTE — Patient Instructions (Signed)
It was great to see you!  For your persistent diarrhea due to Cryptosporidium,  - I am pleased that you are improving!   - Continue to drink plenty of fluids to keep up your hydration. - You can continue to advance your diet as tolerated.  Your blood pressure is well controlled on the decreased dose of Metoprolol (25mg ), continue taking this dose.  We are checking some labs today, we will call you or send you a letter with the results.   Take care and seek immediate care sooner if you develop any concerns.   Gregory Percy, DO [facility]

## 2016-12-11 NOTE — Assessment & Plan Note (Addendum)
Home Metoprolol dose of 100mg  daily was decreased in the hospital due to marked hypotension meeting sepsis criteria. Decreased to 25mg  daily and was normotensive at discharge. BP 124/74 today. Remain on current dose (25mg ) and reevaluate in a month if patient chooses to remain with [facility] as his PCP. Otherwise, should follow up at previously scheduled visit.

## 2016-12-12 LAB — BASIC METABOLIC PANEL
BUN/Creatinine Ratio: 6 — ABNORMAL LOW (ref 10–24)
BUN: 4 mg/dL — AB (ref 8–27)
CALCIUM: 8 mg/dL — AB (ref 8.6–10.2)
CHLORIDE: 102 mmol/L (ref 96–106)
CO2: 22 mmol/L (ref 20–29)
Creatinine, Ser: 0.71 mg/dL — ABNORMAL LOW (ref 0.76–1.27)
GFR, EST AFRICAN AMERICAN: 112 mL/min/{1.73_m2} (ref >59)
GFR, EST NON AFRICAN AMERICAN: 97 mL/min/{1.73_m2} (ref >59)
Glucose: 90 mg/dL (ref 65–99)
Potassium: 3.7 mmol/L (ref 3.5–5.2)
Sodium: 136 mmol/L (ref 134–144)

## 2016-12-12 NOTE — Progress Notes (Signed)
Left message with patient that labs were normal, call the clinic with questions.  [name], DO PGY-1, [facility] 12/12/2016 8:52 AM

## 2016-12-16 ENCOUNTER — Other Ambulatory Visit: Payer: Self-pay | Admitting: Cardiovascular Disease

## 2016-12-18 ENCOUNTER — Inpatient Hospital Stay: Payer: Self-pay | Admitting: Internal Medicine

## 2017-01-10 ENCOUNTER — Ambulatory Visit: Payer: Self-pay | Admitting: Family Medicine

## 2017-01-13 ENCOUNTER — Ambulatory Visit: Payer: Self-pay | Admitting: Family Medicine

## 2017-01-19 ENCOUNTER — Encounter: Payer: Self-pay | Admitting: Family Medicine

## 2017-01-19 NOTE — Progress Notes (Signed)
   Subjective:   Patient ID: Gregory Hansen    DOB: 01-17-1951, 66 y.o. male   MRN: [id number]  Gregory Hansen is a 66 y.o. male with a history of HTN, CAD s/p CABG, ankylosing spondilitis here for   Establish Care - PMH and medications reviewed - H/o CAD s/p CABG - follows with Dr. Burt Knack - H/o depression - on buproprion, serzone. Follows with [facility] for counseling and medication management, Dr. Ebony Hail every 3-4 months. - Currently has partially torn achilles tendon - seeing orthopedics, in soft cast. Starting PT tomorrow, twice weekly for 4 weeks. - Recent hospitalization in 11/2016 for protozoal diarrhea due to Cryptosporidium, has now resolved.  Healthcare Maintenance - Vaccines: pneumococcal  - Colonoscopy: due - A1c: 5.2 11/23/16 - Lipid Panel: 2017 wnl - Hep C screening: due - has eye exam scheduled for December - partial neuropathy in legs, feet - patient recalls negative prostate biopsy due to elevated PSA. Unsure when this was.  Review of Systems:  Per HPI.   [place]: reviewed. Smoking status reviewed. Medications reviewed.  Objective:   BP 138/80   Pulse 61   Temp 98.7 F (37.1 C) (Oral)   Ht 6\' 2"  (1.88 m)   Wt 248 lb (112.5 kg)   SpO2 98%   BMI 31.84 kg/m  Vitals and nursing note reviewed.  General: well nourished, well developed, in no acute distress with non-toxic appearance HEENT: normocephalic, atraumatic, moist mucous membranes CV: regular rate and rhythm without murmurs, rubs, or gallops, 1+ lower extremity edema Lungs: clear to auscultation bilaterally with normal work of breathing Abdomen: soft, non-tender, non-distended, normoactive bowel sounds Skin: warm, dry, no rashes or lesions Extremities: warm and well perfused, normal tone MSK: Full ROM, strength intact, gait normal Neuro: Alert and oriented, speech normal  Assessment & Plan:   Healthcare maintenance PMH, SH, SocH, FH and medications reviewed.  - Gave information to  schedule colonoscopy. - Hep C screening - pneumococcal vaccine given - has eye exam scheduled for December. - follow up with Cardiology, Orthopedics, Psychiatry as scheduled.   Orders Placed This Encounter  Procedures  . Pneumococcal conjugate vaccine 13-valent IM  . Hepatitis C Antibody   Meds ordered this encounter  Medications  . DISCONTD: metoprolol succinate (TOPROL-XL) 100 MG 24 hr tablet  . nitroGLYCERIN (NITROSTAT) 0.4 MG SL tablet    Sig: Place 1 tablet (0.4 mg total) every 5 (five) minutes as needed under the tongue for chest pain. Up to 3 doses    Dispense:  30 tablet    Refill:  0  . metoprolol succinate (TOPROL-XL) 50 MG 24 hr tablet    Sig: Take 1 tablet (50 mg total) daily by mouth.    Dispense:  30 tablet    Refill:  0    [name], DO PGY-1, [facility] 01/20/2017 4:59 PM

## 2017-01-20 ENCOUNTER — Encounter: Payer: Self-pay | Admitting: Family Medicine

## 2017-01-20 ENCOUNTER — Ambulatory Visit (INDEPENDENT_AMBULATORY_CARE_PROVIDER_SITE_OTHER): Payer: Medicare Other | Admitting: Family Medicine

## 2017-01-20 ENCOUNTER — Other Ambulatory Visit: Payer: Self-pay

## 2017-01-20 VITALS — BP 138/80 | HR 61 | Temp 98.7°F | Ht 74.0 in | Wt 248.0 lb

## 2017-01-20 DIAGNOSIS — Z0000 Encounter for general adult medical examination without abnormal findings: Secondary | ICD-10-CM

## 2017-01-20 DIAGNOSIS — Z1159 Encounter for screening for other viral diseases: Secondary | ICD-10-CM | POA: Diagnosis present

## 2017-01-20 DIAGNOSIS — Z23 Encounter for immunization: Secondary | ICD-10-CM | POA: Diagnosis not present

## 2017-01-20 DIAGNOSIS — I2510 Atherosclerotic heart disease of native coronary artery without angina pectoris: Secondary | ICD-10-CM | POA: Diagnosis not present

## 2017-01-20 MED ORDER — NITROGLYCERIN 0.4 MG SL SUBL: 0.4000 mg | SUBLINGUAL_TABLET | SUBLINGUAL | 0 refills | Status: DC | PRN

## 2017-01-20 MED ORDER — METOPROLOL SUCCINATE ER 50 MG PO TB24: 50.0000 mg | ORAL_TABLET | Freq: Every day | ORAL | 0 refills | Status: DC

## 2017-01-20 NOTE — Assessment & Plan Note (Signed)
PMH, SH, SocH, FH and medications reviewed.  - Gave information to schedule colonoscopy. - Hep C screening - pneumococcal vaccine given - has eye exam scheduled for December. - follow up with Cardiology, Orthopedics, Psychiatry as scheduled.

## 2017-01-20 NOTE — Patient Instructions (Signed)
It was great to see you!  - Everything is going well! Continue to follow up with Cardiology, Psychiatry, and Orthopedics as scheduled. - Schedule a colonoscopy for colon cancer screening at your convenience.  We are checking some labs today, we will call you or send you a letter if they are abnormal.   Take care and seek immediate care sooner if you develop any concerns.   [name], DO [facility]

## 2017-01-21 LAB — HEPATITIS C ANTIBODY: Hep C Virus Ab: <0.1 s/co ratio (ref 0.0–0.9)

## 2017-02-18 ENCOUNTER — Other Ambulatory Visit: Payer: Self-pay

## 2017-02-18 ENCOUNTER — Encounter: Payer: Self-pay | Admitting: Family Medicine

## 2017-02-18 ENCOUNTER — Ambulatory Visit (INDEPENDENT_AMBULATORY_CARE_PROVIDER_SITE_OTHER): Payer: Medicare Other | Admitting: Family Medicine

## 2017-02-18 VITALS — BP 130/80 | HR 59 | Temp 98.0°F | Ht 74.0 in | Wt 253.0 lb

## 2017-02-18 DIAGNOSIS — I2510 Atherosclerotic heart disease of native coronary artery without angina pectoris: Secondary | ICD-10-CM

## 2017-02-18 DIAGNOSIS — N62 Hypertrophy of breast: Secondary | ICD-10-CM | POA: Insufficient documentation

## 2017-02-18 NOTE — Assessment & Plan Note (Addendum)
Unilateral L sided gynecomastia with some palpable subareolar tissue. Uncertain etiology. Medication list reviewed, only potential is SSRI serzone which only has < 1% incidence of gynecomastia. Patient is otherwise only on a multivitamin for OTC supplements. No B symptoms to suggest cancer but will get mammogram and check prolactin, testosterone and estradiol.

## 2017-02-18 NOTE — Progress Notes (Signed)
    Subjective:  Gregory Hansen is a 66 y.o. male who presents to the [facility] today with a chief complaint of L nipple pain.   HPI:  Has noticed L breast has been itchy and swollen for the past few months. Initially thought it was due to dry skin because he always has come skin irritation from that but then this persisted and he noticed that L breast was fuller than the other. Is tender to touch No weight loss or weakness/fatigue. Has had night sweats for most of his adult life. No nipple discharge or rashes or skin puckering.   ROS: Per HPI  Objective:  Physical Exam: BP 130/80   Pulse (!) 59   Temp 98 F (36.7 C) (Oral)   Ht 6\' 2"  (1.88 m)   Wt 253 lb (114.8 kg)   SpO2 98%   BMI 32.48 kg/m   Gen: NAD, resting comfortably Breast: L breast visibly larger compared to R on upright view. On supine exam, L breast with palpable 4-5cm of subareolar tissue. No dimpling or skin or nipple discharge.  Skin: warm, dry. No redness or rashes Neuro: grossly normal, moves all extremities Psych: Normal affect and thought content   Assessment/Plan:  Gynecomastia, male Unilateral L sided gynecomastia with some palpable subareolar tissue. Uncertain etiology. Medication list reviewed, only potential is SSRI serzone which only has < 1% incidence of gynecomastia. Patient is otherwise only on a multivitamin for OTC supplements. No B symptoms to suggest cancer but will get mammogram and check prolactin, testosterone and estradiol.    Bufford Lope, DO PGY-2, [facility] 02/18/2017 2:50 PM

## 2017-02-18 NOTE — Patient Instructions (Addendum)
We are checking some labs and a mammogram. If results require attention, either myself or my nurse will get in touch with you. If everything is normal, you will get a message in My Chart. Please give [place] a call if you do not hear from [place] after 2 weeks.  Feel free to call with any questions or concerns at any time, at [phone].   Take care,  Dr. Bufford Lope, DO [facility]  Gynecomastia, Adult Gynecomastia is an overgrowth of gland tissue in a man's breasts. This may cause one or both breasts to become enlarged. This often develops in men who have an imbalance of the male sex hormone (testosterone) and the male sex hormone (estrogen). This means that a man may have too much estrogen, too little testosterone, or both. Gynecomastia may be a normal part of aging for some men. It can also happen to adolescent boys during puberty. What are the causes? Gynecomastia may be caused by:  Certain medicines, such as: ? Estrogen supplements and medicines that act like estrogen in the body. ? Medicines that keep testosterone from functioning normally in the body (testosterone-inhibiting drugs). ? Anabolic steroids. ? Medicines to treat heartburn, cancer, heart disease, mental health problems, HIV (human immunodeficiency virus) or AIDS (acquired immunodeficiency syndrome). ? Antibiotic medicine. ? Chemotherapy medicine.  Recreational drugs, including alcohol, marijuana, and opioids.  Herbal products, including lavender and tea tree oil.  A gene that is passed along from parent to child (inherited).  Tumors in the pituitary or adrenal gland.  An overactive thyroid gland.  Certain inherited disorders, including a genetic disease that causes low testosterone in males (Klinefelter syndrome).  Cancer of the lung, kidney, liver, testicle, or gastrointestinal tract.  Conditions that cause liver or kidney failure.  Poor nutrition and starvation.  Testicle shrinking or failure  (testicularatrophy).  In some cases, the cause may not be known. What increases the risk? You may have a higher risk for gynecomastia if you:  Are 62 years old or older.  Are overweight.  Abuse alcohol or other drugs.  Have a family history of gynecomastia.  What are the signs or symptoms?  Most of the time, breast enlargement is the only symptom. The enlargement may start near the nipple, and the breast tissue may feel firm and rubbery. The breast may feel itchy, painful or tender. How is this diagnosed? This condition may be diagnosed based on:  Your symptoms.  Your medical history.  A physical exam.  Imaging tests, such as: ? An ultrasound. ? A mammogram. ? An MRI.  Blood tests.  Removal of a sample of breast tissue to be tested in a lab (biopsy).  How is this treated? Gynecomastia may go away on its own, without treatment. If gynecomastia is caused by a medical problem or drug abuse, treatment may include:  Getting treatment for the underlying medical problem or for drug abuse.  Changing or stopping medicines.  Medicines to block the effects of estrogen.  Taking a testosterone replacement.  Surgery to remove breast tissue or any lumps in your breasts.  Breast reduction surgery. This may be a possibility if you have severe or painful gynecomastia.  Follow these instructions at home:  Take over-the-counter and prescription medicines only as told by your health care provider.  Talk to your health care provider before taking any herbal medicines or diet supplements.  Do not abuse drugs or alcohol.  Keep all follow-up visits as told by your health care provider. This  is important. Contact a health care provider if:  Your breast tissue grows larger or gets more swollen or painful.  You have a lump in your testicle.  You have blood or discharge coming from your nipples.  Your nipple changes shape.  You develop a hard or painful lump in your  breast. This information is not intended to replace advice given to you by your health care provider. Make sure you discuss any questions you have with your health care provider. Document Released: 04/21/2015 Document Revised: 08/04/2015 Document Reviewed: 04/21/2015 Elsevier Interactive Patient Education  [employer].

## 2017-02-19 ENCOUNTER — Other Ambulatory Visit: Payer: Self-pay | Admitting: Family Medicine

## 2017-02-19 DIAGNOSIS — N62 Hypertrophy of breast: Secondary | ICD-10-CM

## 2017-02-19 LAB — TESTOSTERONE: Testosterone: 231 ng/dL — ABNORMAL LOW (ref 264–916)

## 2017-02-19 LAB — ESTRADIOL: Estradiol: 30.4 pg/mL (ref 7.6–42.6)

## 2017-02-19 LAB — PROLACTIN: Prolactin: 17.8 ng/mL — ABNORMAL HIGH (ref 4.0–15.2)

## 2017-02-24 ENCOUNTER — Ambulatory Visit: Admission: RE | Admit: 2017-02-24 | Payer: Self-pay | Source: Ambulatory Visit

## 2017-02-24 ENCOUNTER — Ambulatory Visit
Admission: RE | Admit: 2017-02-24 | Discharge: 2017-02-24 | Disposition: A | Discharge status: Home / Self Care | Payer: Medicare Other | Source: Ambulatory Visit | Attending: Family Medicine | Admitting: Family Medicine

## 2017-02-24 DIAGNOSIS — N62 Hypertrophy of breast: Secondary | ICD-10-CM

## 2017-03-20 ENCOUNTER — Other Ambulatory Visit: Payer: Self-pay | Admitting: Cardiovascular Disease

## 2017-05-01 ENCOUNTER — Other Ambulatory Visit: Payer: Self-pay | Admitting: Cardiovascular Disease

## 2017-05-01 DIAGNOSIS — E785 Hyperlipidemia, unspecified: Secondary | ICD-10-CM

## 2017-05-01 DIAGNOSIS — I10 Essential (primary) hypertension: Secondary | ICD-10-CM

## 2017-05-01 DIAGNOSIS — I2510 Atherosclerotic heart disease of native coronary artery without angina pectoris: Secondary | ICD-10-CM

## 2017-05-02 ENCOUNTER — Other Ambulatory Visit: Payer: Self-pay | Admitting: Cardiovascular Disease

## 2017-05-02 MED ORDER — METOPROLOL SUCCINATE ER 50 MG PO TB24: 50.0000 mg | ORAL_TABLET | Freq: Every day | ORAL | 0 refills | Status: DC

## 2017-05-22 DIAGNOSIS — M4855XA Collapsed vertebra, not elsewhere classified, thoracolumbar region, initial encounter for fracture: Secondary | ICD-10-CM | POA: Diagnosis not present

## 2017-05-22 DIAGNOSIS — M50323 Other cervical disc degeneration at C6-C7 level: Secondary | ICD-10-CM | POA: Diagnosis not present

## 2017-05-22 DIAGNOSIS — M47812 Spondylosis without myelopathy or radiculopathy, cervical region: Secondary | ICD-10-CM | POA: Diagnosis not present

## 2017-06-05 ENCOUNTER — Other Ambulatory Visit: Payer: Self-pay | Admitting: Cardiovascular Disease

## 2017-07-08 ENCOUNTER — Other Ambulatory Visit: Payer: Self-pay | Admitting: Cardiovascular Disease

## 2017-07-08 DIAGNOSIS — I10 Essential (primary) hypertension: Secondary | ICD-10-CM

## 2017-07-08 DIAGNOSIS — E785 Hyperlipidemia, unspecified: Secondary | ICD-10-CM

## 2017-07-08 DIAGNOSIS — I2510 Atherosclerotic heart disease of native coronary artery without angina pectoris: Secondary | ICD-10-CM

## 2017-07-29 ENCOUNTER — Other Ambulatory Visit: Payer: Self-pay | Admitting: Cardiovascular Disease

## 2017-09-24 ENCOUNTER — Telehealth: Payer: Self-pay | Admitting: Cardiovascular Disease

## 2017-09-24 NOTE — Telephone Encounter (Signed)
Left message for patient to call back  

## 2017-09-24 NOTE — Telephone Encounter (Signed)
New message    Patient requesting order for annual labs

## 2017-09-25 NOTE — Telephone Encounter (Signed)
Returned call to Pt.  Pt coming up on yearly appt.  Is wondering if he should come in for lab work prior to appt?  Advised Pt that after he is assessed the doctor would decide at that time what type of lab work to obtain-which could vary based on Pt's symptoms.    Pt indicates understanding.  Is ok with waiting to get labs until appt.

## 2017-10-01 ENCOUNTER — Ambulatory Visit: Payer: Self-pay | Admitting: Cardiovascular Disease

## 2017-10-03 ENCOUNTER — Other Ambulatory Visit: Payer: Self-pay | Admitting: Cardiovascular Disease

## 2017-10-03 DIAGNOSIS — I10 Essential (primary) hypertension: Secondary | ICD-10-CM

## 2017-10-03 DIAGNOSIS — I2510 Atherosclerotic heart disease of native coronary artery without angina pectoris: Secondary | ICD-10-CM

## 2017-10-03 DIAGNOSIS — E785 Hyperlipidemia, unspecified: Secondary | ICD-10-CM

## 2017-10-13 ENCOUNTER — Other Ambulatory Visit: Payer: Self-pay | Admitting: *Deleted

## 2017-10-13 DIAGNOSIS — I2510 Atherosclerotic heart disease of native coronary artery without angina pectoris: Secondary | ICD-10-CM

## 2017-10-13 DIAGNOSIS — I10 Essential (primary) hypertension: Secondary | ICD-10-CM

## 2017-10-13 DIAGNOSIS — E785 Hyperlipidemia, unspecified: Secondary | ICD-10-CM

## 2017-10-13 MED ORDER — PANTOPRAZOLE SODIUM 40 MG PO TBEC: 40.0000 mg | DELAYED_RELEASE_TABLET | Freq: Every day | ORAL | 0 refills | Status: DC

## 2017-10-13 MED ORDER — METOPROLOL SUCCINATE ER 50 MG PO TB24: 50.0000 mg | ORAL_TABLET | Freq: Every day | ORAL | 0 refills | Status: DC

## 2017-10-13 MED ORDER — RAMIPRIL 10 MG PO CAPS: 10.0000 mg | ORAL_CAPSULE | Freq: Two times a day (BID) | ORAL | 0 refills | Status: DC

## 2017-10-13 NOTE — Telephone Encounter (Signed)
Patient called and stated that per his mail order pharmacy, the office refused to refill his medications and he would like to know why. I asked the patient if he had an appointment scheduled and he stated that he had one scheduled for last month but it was rescheduled as Dr Burt Knack was not in the office on the day that it was originally scheduled for. He has an upcoming appointment later this month. I made him aware that I could send in one refill and that he needs to keep his upcoming appointment for further refills. He verbalized his understanding and appreciation.

## 2017-11-04 ENCOUNTER — Encounter: Payer: Self-pay | Admitting: Cardiology

## 2017-11-04 ENCOUNTER — Ambulatory Visit: Payer: Medicare Other | Admitting: Cardiology

## 2017-11-04 VITALS — BP 122/74 | HR 58 | Ht 74.0 in | Wt 243.0 lb

## 2017-11-04 DIAGNOSIS — I2510 Atherosclerotic heart disease of native coronary artery without angina pectoris: Secondary | ICD-10-CM | POA: Diagnosis not present

## 2017-11-04 DIAGNOSIS — I10 Essential (primary) hypertension: Secondary | ICD-10-CM | POA: Diagnosis not present

## 2017-11-04 DIAGNOSIS — E785 Hyperlipidemia, unspecified: Secondary | ICD-10-CM | POA: Diagnosis not present

## 2017-11-04 DIAGNOSIS — G629 Polyneuropathy, unspecified: Secondary | ICD-10-CM | POA: Diagnosis not present

## 2017-11-04 MED ORDER — ATORVASTATIN CALCIUM 80 MG PO TABS: 80.0000 mg | ORAL_TABLET | Freq: Every day | ORAL | 3 refills | Status: DC

## 2017-11-04 MED ORDER — RAMIPRIL 10 MG PO CAPS: 10.0000 mg | ORAL_CAPSULE | Freq: Two times a day (BID) | ORAL | 3 refills | Status: DC

## 2017-11-04 MED ORDER — PANTOPRAZOLE SODIUM 40 MG PO TBEC: 40.0000 mg | DELAYED_RELEASE_TABLET | Freq: Every day | ORAL | 3 refills | Status: DC

## 2017-11-04 MED ORDER — METOPROLOL SUCCINATE ER 50 MG PO TB24: 50.0000 mg | ORAL_TABLET | Freq: Every day | ORAL | 3 refills | Status: DC

## 2017-11-04 NOTE — Progress Notes (Signed)
Cardiology Office Note:    Date:  11/04/2017   ID:  Gregory Hansen, DOB 05-Jun-1950, MRN [id number]  PCP:  [name], DO  Cardiologist:  [name], MD  Referring MD: [name], DO   Chief Complaint  Patient presents with  . Follow-up    CAD    History of Present Illness:    Gregory Hansen is a 67 y.o. male with a past medical history significant for CAD s/p multiple PCI and then CABG 2015, HTN, HLD, GERD, arthritis and anxiety. He underwent 4 vessel CABG in 2015 with LIMA to LAD, vein graft obtuse marginal, and sequential vein graft to the PDA and PLA branches of the RCA. He had an uncomplicated postoperative course.  He was last seen in the office on 04/12/2016 by Dr. Burt Knack at which time he was doing well.  He was hospitalized in 11/2016 for acute renal injury related to an illness with nausea, vomiting, diarrhea and hypotension.  He was found to have Cryptosporidium infection.  His renal function recovered.  He is here today for yearly follow up and medication management. His son lives with him. He restores vintage fountain pens. He is not currently doing any organized exercise. He does try to park at the back of parking lots. He had a previous achilles tendon tear that limited activity but this is now resolved. He also has peripheral neuropathy which has been treated with supplements, infrared therapy and vibration therapy at the [facility]. His neuropathy has improved. He rides a recumbent outdoor bike a couple of times per week for about 45 minutes. He does housework including vacuuming and mopping. He drives. He denies chest pain/pressure/tightness or shortness of breath at rest or with exertion. He has occ skipped heart beats, chronic for him. He denies lightheadedness, syncope, orthopnea, PND or edema. He has occ night sweats.   Past Medical History:  Diagnosis Date  . Anginal pain ([place])   . Anxiety   . Arthritis    SPINE   . Arthritis     . Coronary artery disease    s/p multiple percutaneous interventions, PCI 202 for DMI and DES distal RCA and CFX-OM 2006  . Coronary artery disease   . Depression   . GERD (gastroesophageal reflux disease)   . Hyperlipidemia    mixed  . Hyperlipidemia   . Hypertension   . MI (myocardial infarction) ([place])   . Myocardial infarction ([place])   . Spondylitis, ankylosing ([place])     Past Surgical History:  Procedure Laterality Date  . CARDIAC CATHETERIZATION     stent RCA June3, 2002, Stent OM/PTCA circumflex August 13, 2000  . CORONARY ANGIOPLASTY WITH STENT PLACEMENT     first one in 2006; had another place 3 months ago (August 2013)  . CORONARY ANGIOPLASTY WITH STENT PLACEMENT  03/26/2013   RCA           DR COOPER  . CORONARY ANGIOPLASTY WITH STENT PLACEMENT    . CORONARY ARTERY BYPASS GRAFT N/A 10/29/2013   Procedure: CORONARY ARTERY BYPASS GRAFTING x 4 (LIMA-LAD, SVG-OM, SVG-PD-PL) ENDOSCOPIC VEIN HARVEST RIGHT THIGH;  Surgeon: Gaye Pollack, MD;  Location: [facility];  Service: Open Heart Surgery;  Laterality: N/A;  . CORONARY ARTERY BYPASS GRAFT    . FINGER SURGERY     5  TH     DIGIT RIGHT HAND  . FINGER SURGERY    . INTRAOPERATIVE TRANSESOPHAGEAL ECHOCARDIOGRAM N/A 10/29/2013   Procedure: INTRAOPERATIVE TRANSESOPHAGEAL  ECHOCARDIOGRAM;  Surgeon: Gaye Pollack, MD;  Location: [facility];  Service: Open Heart Surgery;  Laterality: N/A;  . KNEE ARTHROSCOPY W/ LASER  2003  . LEFT HEART CATHETERIZATION WITH CORONARY ANGIOGRAM N/A 10/01/2011   Procedure: LEFT HEART CATHETERIZATION WITH CORONARY ANGIOGRAM;  Surgeon: [name], MD;  Location: [facility] CATH LAB;  Service: Cardiovascular;  Laterality: N/A;  . LEFT HEART CATHETERIZATION WITH CORONARY ANGIOGRAM N/A 03/26/2013   Procedure: LEFT HEART CATHETERIZATION WITH CORONARY ANGIOGRAM;  Surgeon: Blane Ohara, MD;  Location: [facility] CATH LAB;  Service: Cardiovascular;  Laterality: N/A;  . LEFT HEART CATHETERIZATION WITH CORONARY ANGIOGRAM N/A 10/25/2013    Procedure: LEFT HEART CATHETERIZATION WITH CORONARY ANGIOGRAM;  Surgeon: Blane Ohara, MD;  Location: [facility] CATH LAB;  Service: Cardiovascular;  Laterality: N/A;    Current Medications: Current Meds  Medication Sig  . aspirin 81 MG tablet Take 81 mg by mouth every evening.   Marland Kitchen atorvastatin (LIPITOR) 80 MG tablet Take 1 tablet (80 mg total) by mouth daily.  Marland Kitchen buPROPion (WELLBUTRIN XL) 300 MG 24 hr tablet Take 300 mg by mouth daily.  . cetirizine (KLS ALLER-TEC) 10 MG tablet Take 10 mg by mouth daily.  . fluticasone (FLONASE) 50 MCG/ACT nasal spray Place 2 sprays into both nostrils daily.  . metoprolol succinate (TOPROL-XL) 50 MG 24 hr tablet Take 1 tablet (50 mg total) by mouth daily.  . Multiple Vitamin (MULTIVITAMIN WITH MINERALS) TABS tablet Take 1 tablet by mouth daily.  . nefazodone (SERZONE) 200 MG tablet Take 200 mg daily by mouth.  . nitroGLYCERIN (NITROSTAT) 0.4 MG SL tablet Place 1 tablet (0.4 mg total) every 5 (five) minutes as needed under the tongue for chest pain. Up to 3 doses  . pantoprazole (PROTONIX) 40 MG tablet Take 1 tablet (40 mg total) by mouth daily.  . ramipril (ALTACE) 10 MG capsule Take 1 capsule (10 mg total) by mouth 2 (two) times daily.  . sildenafil (REVATIO) 20 MG tablet TAKE 2-5 TABLETS BY MOUTH AS NEEDED PRIOR TO SEXUAL ACTIVITY  . vitamin B-12 (CYANOCOBALAMIN) 1000 MCG tablet Take 1,000 mcg by mouth daily.  . [DISCONTINUED] atorvastatin (LIPITOR) 80 MG tablet Take 1 tablet by mouth daily.  . [DISCONTINUED] metoprolol succinate (TOPROL-XL) 50 MG 24 hr tablet Take 1 tablet by mouth daily.  . [DISCONTINUED] pantoprazole (PROTONIX) 40 MG tablet Take 1 tablet by mouth  daily  . [DISCONTINUED] ramipril (ALTACE) 10 MG capsule Take 1 capsule by mouth 2 (two) times daily.     Allergies:   Patient has no known allergies.   Social History   Socioeconomic History  . Marital status: Single    Spouse name: n/a  . Number of children: 2  . Years of education: Not  on file  . Highest education level: Not on file  Occupational History  . Occupation: Sports coach: unemployed  Social Needs  . Financial resource strain: Not on file  . Food insecurity:    Worry: Not on file    Inability: Not on file  . Transportation needs:    Medical: Not on file    Non-medical: Not on file  Tobacco Use  . Smoking status: Former Smoker    Packs/day: 2.00    Years: 10.00    Pack years: 20.00    Types: Cigarettes    Last attempt to quit: 06/30/1979    Years since quitting: 38.3  . Smokeless tobacco: Never Used  Substance and Sexual Activity  .  Alcohol use: Yes    Alcohol/week: 16.0 standard drinks    Types: 14 Glasses of wine, 2 Shots of liquor per week    Comment: 3-4 glasses of wine at dinner everyday.  . Drug use: No  . Sexual activity: Never    Birth control/protection: Condom  Lifestyle  . Physical activity:    Days per week: Not on file    Minutes per session: Not on file  . Stress: Not on file  Relationships  . Social connections:    Talks on phone: Not on file    Gets together: Not on file    Attends religious service: Not on file    Active member of club or organization: Not on file    Attends meetings of clubs or organizations: Not on file    Relationship status: Not on file  Other Topics Concern  . Not on file  Social History Narrative   ** Merged History Encounter **       His adult son and daughter live with him, saving money for graduate school.     Family History: The patient's family history includes Breast cancer (age of onset: 58) in his mother; Heart attack in his brother; Heart disease in his brother, father, maternal grandfather, and unknown relative; Leukemia in his maternal grandmother. ROS:   Please see the history of present illness.     All other systems reviewed and are negative.  EKGs/Labs/Other Studies Reviewed:    The following studies were reviewed today: None since his CABG  EKG:  EKG is   ordered today.  The ekg ordered today demonstrates sinus bradycardia, 58 bpm, IRBBB. No significant changes from previous.   Recent Labs: 11/24/2016: Magnesium 1.6 12/04/2016: ALT 29 12/06/2016: Hemoglobin 11.6; Platelets 118 12/11/2016: BUN 4; Creatinine, Ser 0.71; Potassium 3.7; Sodium 136   Recent Lipid Panel    Component Value Date/Time   CHOL 124 (L) 05/25/2015 0937   TRIG 143 05/25/2015 0937   HDL 44 05/25/2015 0937   CHOLHDL 2.8 05/25/2015 0937   VLDL 29 05/25/2015 0937   LDLCALC 51 05/25/2015 0937   LDLDIRECT 89.7 08/01/2011 0921    Physical Exam:    VS:  BP 122/74   Pulse (!) 58   Ht 6\' 2"  (1.88 m)   Wt 243 lb (110.2 kg)   SpO2 96%   BMI 31.20 kg/m     Wt Readings from Last 3 Encounters:  11/04/17 243 lb (110.2 kg)  02/18/17 253 lb (114.8 kg)  01/20/17 248 lb (112.5 kg)     Physical Exam  Constitutional: He is oriented to person, place, and time. He appears well-developed and well-nourished. No distress.  HENT:  Head: Normocephalic and atraumatic.  Poor dentition  Neck: Normal range of motion. Neck supple. No JVD present.  Cardiovascular: Normal rate, regular rhythm, normal heart sounds and intact distal pulses. Exam reveals no gallop and no friction rub.  No murmur heard. Pulmonary/Chest: Effort normal and breath sounds normal. No respiratory distress. He has no wheezes. He has no rales.  Abdominal: Soft. Bowel sounds are normal. He exhibits no distension.  Musculoskeletal: Normal range of motion. He exhibits no edema.  Lower legs with darkened shiny skin. Varicose veins present bilateral legs.   Neurological: He is alert and oriented to person, place, and time.  Skin: Skin is warm and dry.  Psychiatric: He has a normal mood and affect. His behavior is normal. Judgment and thought content normal.  Vitals reviewed.    ASSESSMENT:  1. Coronary artery disease involving native coronary artery of native heart without angina pectoris   2. HYPERTENSION,  BENIGN   3. Hyperlipidemia with target LDL less than 70   4. Peripheral polyneuropathy    PLAN:    In order of problems listed above:  CAD: s/p 4V CABG 10/2013. On aspirin 81 mg, BB, ACE-I, high intensity statin. No anginal symptoms. Continue current therapy.   Hypertension: Managed on Toprol XL 50 mg daily, ramipril 10 mg BID. BP well controlled.   Hyperlipidemia: atorvastatin 80 mg. Last lipid profile in 05/2015 showed LDL 51, at goal of <70. Now taking statin QOD. There is a question of contribution of low cholesterol to nerve damage and his peripheral neuropathy- Dr. Salomon Fick Ward. Will check lipid panel.   Peripheral neuropathy: being treated at the [facility] with some improvement in symptoms.    Medication Adjustments/Labs and Tests Ordered: Current medicines are reviewed at length with the patient today.  Concerns regarding medicines are outlined above. Labs and tests ordered and medication changes are outlined in the patient instructions below:  Patient Instructions  Medication Instructions: Your physician recommends that you continue on your current medications as directed. Please refer to the Current Medication list given to you today.   Labwork: TODAY: BMET, LIPIDS  Procedures/Testing: None Ordered  Follow-Up: Your physician wants you to follow-up in: 1 year with Dr.Cooper You will receive a reminder letter in the mail two months in advance. If you don't receive a letter, please call our office to schedule the follow-up appointment.   Any Additional Special Instructions Will Be Listed Below (If Applicable).   DASH Eating Plan DASH stands for "Dietary Approaches to Stop Hypertension." The DASH eating plan is a healthy eating plan that has been shown to reduce high blood pressure (hypertension). It may also reduce your risk for type 2 diabetes, heart disease, and stroke. The DASH eating plan may also help with weight loss. What are tips for  following this plan? General guidelines  Avoid eating more than 2,300 mg (milligrams) of salt (sodium) a day. If you have hypertension, you may need to reduce your sodium intake to 1,500 mg a day.  Limit alcohol intake to no more than 1 drink a day for nonpregnant women and 2 drinks a day for men. One drink equals 12 oz of beer, 5 oz of wine, or 1 oz of hard liquor.  Work with your health care provider to maintain a healthy body weight or to lose weight. Ask what an ideal weight is for you.  Get at least 30 minutes of exercise that causes your heart to beat faster (aerobic exercise) most days of the week. Activities may include walking, swimming, or biking.  Work with your health care provider or diet and nutrition specialist (dietitian) to adjust your eating plan to your individual calorie needs. Reading food labels  Check food labels for the amount of sodium per serving. Choose foods with less than 5 percent of the Daily Value of sodium. Generally, foods with less than 300 mg of sodium per serving fit into this eating plan.  To find whole grains, look for the word "whole" as the first word in the ingredient list. Shopping  Buy products labeled as "low-sodium" or "no salt added."  Buy fresh foods. Avoid canned foods and premade or frozen meals. Cooking  Avoid adding salt when cooking. Use salt-free seasonings or herbs instead of table salt or sea salt. Check with your health care  provider or pharmacist before using salt substitutes.  Do not fry foods. Cook foods using healthy methods such as baking, boiling, grilling, and broiling instead.  Cook with heart-healthy oils, such as olive, canola, soybean, or sunflower oil. Meal planning   Eat a balanced diet that includes: ? 5 or more servings of fruits and vegetables each day. At each meal, try to fill half of your plate with fruits and vegetables. ? Up to 6-8 servings of whole grains each day. ? Less than 6 oz of lean meat,  poultry, or fish each day. A 3-oz serving of meat is about the same size as a deck of cards. One egg equals 1 oz. ? 2 servings of low-fat dairy each day. ? A serving of nuts, seeds, or beans 5 times each week. ? Heart-healthy fats. Healthy fats called Omega-3 fatty acids are found in foods such as flaxseeds and coldwater fish, like sardines, salmon, and mackerel.  Limit how much you eat of the following: ? Canned or prepackaged foods. ? Food that is high in trans fat, such as fried foods. ? Food that is high in saturated fat, such as fatty meat. ? Sweets, desserts, sugary drinks, and other foods with added sugar. ? Full-fat dairy products.  Do not salt foods before eating.  Try to eat at least 2 vegetarian meals each week.  Eat more home-cooked food and less restaurant, buffet, and fast food.  When eating at a restaurant, ask that your food be prepared with less salt or no salt, if possible. What foods are recommended? The items listed may not be a complete list. Talk with your dietitian about what dietary choices are best for you. Grains Whole-grain or whole-wheat bread. Whole-grain or whole-wheat pasta. Brown rice. [place]. Bulgur. Whole-grain and low-sodium cereals. Pita bread. Low-fat, low-sodium crackers. Whole-wheat flour tortillas. Vegetables Fresh or frozen vegetables (raw, steamed, roasted, or grilled). Low-sodium or reduced-sodium tomato and vegetable juice. Low-sodium or reduced-sodium tomato sauce and tomato paste. Low-sodium or reduced-sodium canned vegetables. Fruits All fresh, dried, or frozen fruit. Canned fruit in natural juice (without added sugar). Meat and other protein foods Skinless chicken or [place]. Ground chicken or [place]. Pork with fat trimmed off. Fish and seafood. Egg whites. Dried beans, peas, or lentils. Unsalted nuts, nut butters, and seeds. Unsalted canned beans. Lean cuts of beef with fat trimmed off. Low-sodium, lean deli meat. Dairy Low-fat  (1%) or fat-free (skim) milk. Fat-free, low-fat, or reduced-fat cheeses. Nonfat, low-sodium ricotta or cottage cheese. Low-fat or nonfat yogurt. Low-fat, low-sodium cheese. Fats and oils Soft margarine without trans fats. Vegetable oil. Low-fat, reduced-fat, or light mayonnaise and salad dressings (reduced-sodium). Canola, safflower, olive, soybean, and sunflower oils. Avocado. Seasoning and other foods Herbs. Spices. Seasoning mixes without salt. Unsalted popcorn and pretzels. Fat-free sweets. What foods are not recommended? The items listed may not be a complete list. Talk with your dietitian about what dietary choices are best for you. Grains Baked goods made with fat, such as croissants, muffins, or some breads. Dry pasta or rice meal packs. Vegetables Creamed or fried vegetables. Vegetables in a cheese sauce. Regular canned vegetables (not low-sodium or reduced-sodium). Regular canned tomato sauce and paste (not low-sodium or reduced-sodium). Regular tomato and vegetable juice (not low-sodium or reduced-sodium). Angie Fava. Olives. Fruits Canned fruit in a light or heavy syrup. Fried fruit. Fruit in cream or butter sauce. Meat and other protein foods Fatty cuts of meat. Ribs. Fried meat. Berniece Salines. Sausage. Bologna and other processed lunch meats. Salami. Fatback.  Hotdogs. Bratwurst. Salted nuts and seeds. Canned beans with added salt. Canned or smoked fish. Whole eggs or egg yolks. Chicken or [place] with skin. Dairy Whole or 2% milk, cream, and half-and-half. Whole or full-fat cream cheese. Whole-fat or sweetened yogurt. Full-fat cheese. Nondairy creamers. Whipped toppings. Processed cheese and cheese spreads. Fats and oils Butter. Stick margarine. Lard. Shortening. Ghee. Bacon fat. Tropical oils, such as coconut, palm kernel, or palm oil. Seasoning and other foods Salted popcorn and pretzels. Onion salt, garlic salt, seasoned salt, table salt, and sea salt. Worcestershire sauce. Tartar sauce.  Barbecue sauce. Teriyaki sauce. Soy sauce, including reduced-sodium. Steak sauce. Canned and packaged gravies. Fish sauce. Oyster sauce. Cocktail sauce. Horseradish that you find on the shelf. Ketchup. Mustard. Meat flavorings and tenderizers. Bouillon cubes. Hot sauce and Tabasco sauce. Premade or packaged marinades. Premade or packaged taco seasonings. Relishes. Regular salad dressings. Where to find more information:  National Heart, Lung, and [place]: https://wilson-eaton.com/  American Heart Association: www.heart.org Summary  The DASH eating plan is a healthy eating plan that has been shown to reduce high blood pressure (hypertension). It may also reduce your risk for type 2 diabetes, heart disease, and stroke.  With the DASH eating plan, you should limit salt (sodium) intake to 2,300 mg a day. If you have hypertension, you may need to reduce your sodium intake to 1,500 mg a day.  When on the DASH eating plan, aim to eat more fresh fruits and vegetables, whole grains, lean proteins, low-fat dairy, and heart-healthy fats.  Work with your health care provider or diet and nutrition specialist (dietitian) to adjust your eating plan to your individual calorie needs. This information is not intended to replace advice given to you by your health care provider. Make sure you discuss any questions you have with your health care provider. Document Released: 02/14/2011 Document Revised: 02/19/2016 Document Reviewed: 02/19/2016 Elsevier Interactive Patient Education  [employer].    If you need a refill on your cardiac medications before your next appointment, please call your pharmacy.      Signed, Daune Perch, NP  11/04/2017 4:40 PM    [facility]

## 2017-11-04 NOTE — Patient Instructions (Addendum)
Medication Instructions: Your physician recommends that you continue on your current medications as directed. Please refer to the Current Medication list given to you today.   Labwork: TODAY: BMET, LIPIDS  Procedures/Testing: None Ordered  Follow-Up: Your physician wants you to follow-up in: 1 year with Dr.Cooper You will receive a reminder letter in the mail two months in advance. If you don't receive a letter, please call our office to schedule the follow-up appointment.   Any Additional Special Instructions Will Be Listed Below (If Applicable).   DASH Eating Plan DASH stands for "Dietary Approaches to Stop Hypertension." The DASH eating plan is a healthy eating plan that has been shown to reduce high blood pressure (hypertension). It may also reduce your risk for type 2 diabetes, heart disease, and stroke. The DASH eating plan may also help with weight loss. What are tips for following this plan? General guidelines  Avoid eating more than 2,300 mg (milligrams) of salt (sodium) a day. If you have hypertension, you may need to reduce your sodium intake to 1,500 mg a day.  Limit alcohol intake to no more than 1 drink a day for nonpregnant women and 2 drinks a day for men. One drink equals 12 oz of beer, 5 oz of wine, or 1 oz of hard liquor.  Work with your health care provider to maintain a healthy body weight or to lose weight. Ask what an ideal weight is for you.  Get at least 30 minutes of exercise that causes your heart to beat faster (aerobic exercise) most days of the week. Activities may include walking, swimming, or biking.  Work with your health care provider or diet and nutrition specialist (dietitian) to adjust your eating plan to your individual calorie needs. Reading food labels  Check food labels for the amount of sodium per serving. Choose foods with less than 5 percent of the Daily Value of sodium. Generally, foods with less than 300 mg of sodium per serving fit into  this eating plan.  To find whole grains, look for the word "whole" as the first word in the ingredient list. Shopping  Buy products labeled as "low-sodium" or "no salt added."  Buy fresh foods. Avoid canned foods and premade or frozen meals. Cooking  Avoid adding salt when cooking. Use salt-free seasonings or herbs instead of table salt or sea salt. Check with your health care provider or pharmacist before using salt substitutes.  Do not fry foods. Cook foods using healthy methods such as baking, boiling, grilling, and broiling instead.  Cook with heart-healthy oils, such as olive, canola, soybean, or sunflower oil. Meal planning   Eat a balanced diet that includes: ? 5 or more servings of fruits and vegetables each day. At each meal, try to fill half of your plate with fruits and vegetables. ? Up to 6-8 servings of whole grains each day. ? Less than 6 oz of lean meat, poultry, or fish each day. A 3-oz serving of meat is about the same size as a deck of cards. One egg equals 1 oz. ? 2 servings of low-fat dairy each day. ? A serving of nuts, seeds, or beans 5 times each week. ? Heart-healthy fats. Healthy fats called Omega-3 fatty acids are found in foods such as flaxseeds and coldwater fish, like sardines, salmon, and mackerel.  Limit how much you eat of the following: ? Canned or prepackaged foods. ? Food that is high in trans fat, such as fried foods. ? Food that is high in  saturated fat, such as fatty meat. ? Sweets, desserts, sugary drinks, and other foods with added sugar. ? Full-fat dairy products.  Do not salt foods before eating.  Try to eat at least 2 vegetarian meals each week.  Eat more home-cooked food and less restaurant, buffet, and fast food.  When eating at a restaurant, ask that your food be prepared with less salt or no salt, if possible. What foods are recommended? The items listed may not be a complete list. Talk with your dietitian about what dietary  choices are best for you. Grains Whole-grain or whole-wheat bread. Whole-grain or whole-wheat pasta. Brown rice. [place]. Bulgur. Whole-grain and low-sodium cereals. Pita bread. Low-fat, low-sodium crackers. Whole-wheat flour tortillas. Vegetables Fresh or frozen vegetables (raw, steamed, roasted, or grilled). Low-sodium or reduced-sodium tomato and vegetable juice. Low-sodium or reduced-sodium tomato sauce and tomato paste. Low-sodium or reduced-sodium canned vegetables. Fruits All fresh, dried, or frozen fruit. Canned fruit in natural juice (without added sugar). Meat and other protein foods Skinless chicken or [place]. Ground chicken or [place]. Pork with fat trimmed off. Fish and seafood. Egg whites. Dried beans, peas, or lentils. Unsalted nuts, nut butters, and seeds. Unsalted canned beans. Lean cuts of beef with fat trimmed off. Low-sodium, lean deli meat. Dairy Low-fat (1%) or fat-free (skim) milk. Fat-free, low-fat, or reduced-fat cheeses. Nonfat, low-sodium ricotta or cottage cheese. Low-fat or nonfat yogurt. Low-fat, low-sodium cheese. Fats and oils Soft margarine without trans fats. Vegetable oil. Low-fat, reduced-fat, or light mayonnaise and salad dressings (reduced-sodium). Canola, safflower, olive, soybean, and sunflower oils. Avocado. Seasoning and other foods Herbs. Spices. Seasoning mixes without salt. Unsalted popcorn and pretzels. Fat-free sweets. What foods are not recommended? The items listed may not be a complete list. Talk with your dietitian about what dietary choices are best for you. Grains Baked goods made with fat, such as croissants, muffins, or some breads. Dry pasta or rice meal packs. Vegetables Creamed or fried vegetables. Vegetables in a cheese sauce. Regular canned vegetables (not low-sodium or reduced-sodium). Regular canned tomato sauce and paste (not low-sodium or reduced-sodium). Regular tomato and vegetable juice (not low-sodium or reduced-sodium).  Angie Fava. Olives. Fruits Canned fruit in a light or heavy syrup. Fried fruit. Fruit in cream or butter sauce. Meat and other protein foods Fatty cuts of meat. Ribs. Fried meat. Berniece Salines. Sausage. Bologna and other processed lunch meats. Salami. Fatback. Hotdogs. Bratwurst. Salted nuts and seeds. Canned beans with added salt. Canned or smoked fish. Whole eggs or egg yolks. Chicken or [place] with skin. Dairy Whole or 2% milk, cream, and half-and-half. Whole or full-fat cream cheese. Whole-fat or sweetened yogurt. Full-fat cheese. Nondairy creamers. Whipped toppings. Processed cheese and cheese spreads. Fats and oils Butter. Stick margarine. Lard. Shortening. Ghee. Bacon fat. Tropical oils, such as coconut, palm kernel, or palm oil. Seasoning and other foods Salted popcorn and pretzels. Onion salt, garlic salt, seasoned salt, table salt, and sea salt. Worcestershire sauce. Tartar sauce. Barbecue sauce. Teriyaki sauce. Soy sauce, including reduced-sodium. Steak sauce. Canned and packaged gravies. Fish sauce. Oyster sauce. Cocktail sauce. Horseradish that you find on the shelf. Ketchup. Mustard. Meat flavorings and tenderizers. Bouillon cubes. Hot sauce and Tabasco sauce. Premade or packaged marinades. Premade or packaged taco seasonings. Relishes. Regular salad dressings. Where to find more information:  National Heart, Lung, and [place]: https://wilson-eaton.com/  American Heart Association: www.heart.org Summary  The DASH eating plan is a healthy eating plan that has been shown to reduce high blood pressure (hypertension). It may also  reduce your risk for type 2 diabetes, heart disease, and stroke.  With the DASH eating plan, you should limit salt (sodium) intake to 2,300 mg a day. If you have hypertension, you may need to reduce your sodium intake to 1,500 mg a day.  When on the DASH eating plan, aim to eat more fresh fruits and vegetables, whole grains, lean proteins, low-fat dairy, and  heart-healthy fats.  Work with your health care provider or diet and nutrition specialist (dietitian) to adjust your eating plan to your individual calorie needs. This information is not intended to replace advice given to you by your health care provider. Make sure you discuss any questions you have with your health care provider. Document Released: 02/14/2011 Document Revised: 02/19/2016 Document Reviewed: 02/19/2016 Elsevier Interactive Patient Education  [employer].    If you need a refill on your cardiac medications before your next appointment, please call your pharmacy.

## 2017-11-05 LAB — LIPID PANEL
CHOLESTEROL TOTAL: 148 mg/dL (ref 100–199)
Chol/HDL Ratio: 2.8 ratio (ref 0.0–5.0)
HDL: 52 mg/dL (ref >39)
LDL Calculated: 82 mg/dL (ref 0–99)
Triglycerides: 71 mg/dL (ref 0–149)
VLDL CHOLESTEROL CAL: 14 mg/dL (ref 5–40)

## 2017-11-05 LAB — BASIC METABOLIC PANEL
BUN / CREAT RATIO: 10 (ref 10–24)
BUN: 9 mg/dL (ref 8–27)
CALCIUM: 9.3 mg/dL (ref 8.6–10.2)
CO2: 25 mmol/L (ref 20–29)
Chloride: 97 mmol/L (ref 96–106)
Creatinine, Ser: 0.87 mg/dL (ref 0.76–1.27)
GFR calc Af Amer: 103 mL/min/{1.73_m2} (ref >59)
GFR calc non Af Amer: 89 mL/min/{1.73_m2} (ref >59)
GLUCOSE: 102 mg/dL — AB (ref 65–99)
Potassium: 4.6 mmol/L (ref 3.5–5.2)
Sodium: 138 mmol/L (ref 134–144)

## 2017-12-18 ENCOUNTER — Ambulatory Visit (INDEPENDENT_AMBULATORY_CARE_PROVIDER_SITE_OTHER): Payer: Medicare Other

## 2017-12-18 VITALS — BP 130/80 | HR 58 | Temp 97.9°F | Ht 74.0 in | Wt 248.4 lb

## 2017-12-18 DIAGNOSIS — Z1211 Encounter for screening for malignant neoplasm of colon: Secondary | ICD-10-CM

## 2017-12-18 DIAGNOSIS — Z0000 Encounter for general adult medical examination without abnormal findings: Secondary | ICD-10-CM | POA: Diagnosis not present

## 2017-12-18 DIAGNOSIS — Z23 Encounter for immunization: Secondary | ICD-10-CM | POA: Diagnosis not present

## 2017-12-18 NOTE — Patient Instructions (Signed)
Gregory Hansen , Thank you for taking time to come for your Medicare Wellness Visit. I appreciate your ongoing commitment to your health goals. Please review the following plan we discussed and let me know if I can assist you in the future.   Your flu vaccine was given today. You will receive a call from [facility] to schedule your colonoscopy.  These are the goals we discussed: you had no stated goals. Goals   None     This is a list of the screening recommended for you and due dates:  Health Maintenance  Topic Date Due  . Colon Cancer Screening  09/29/1999  . Tetanus Vaccine  06/29/2025  . Flu Shot  Completed  .  Hepatitis C: One time screening is recommended by Center for Disease Control  (CDC) for  adults born from [birth year] through 1966.   Completed  . Pneumonia vaccines  Completed     Colonoscopy, Adult A colonoscopy is an exam to look at the large intestine. It is done to check for problems, such as:  Lumps (tumors).  Growths (polyps).  Swelling (inflammation).  Bleeding.  What happens before the procedure? Eating and drinking Follow instructions from your doctor about eating and drinking. These instructions may include:  A few days before the procedure - follow a low-fiber diet. ? Avoid nuts. ? Avoid seeds. ? Avoid dried fruit. ? Avoid raw fruits. ? Avoid vegetables.  1-3 days before the procedure - follow a clear liquid diet. Avoid liquids that have red or purple dye. Drink only clear liquids, such as: ? Clear broth or bouillon. ? Black coffee or tea. ? Clear juice. ? Clear soft drinks or sports drinks. ? Gelatin dessert. ? Popsicles.  On the day of the procedure - do not eat or drink anything during the 2 hours before the procedure.  Bowel prep If you were prescribed an oral bowel prep:  Take it as told by your doctor. Starting the day before your procedure, you will need to drink a lot of liquid. The liquid will cause you to poop (have bowel movements)  until your poop is almost clear or light green.  If your skin or butt gets irritated from diarrhea, you may: ? Wipe the area with wipes that have medicine in them, such as adult wet wipes with aloe and vitamin E. ? Put something on your skin that soothes the area, such as petroleum jelly.  If you throw up (vomit) while drinking the bowel prep, take a break for up to 60 minutes. Then begin the bowel prep again. If you keep throwing up and you cannot take the bowel prep without throwing up, call your doctor.  General instructions  Ask your doctor about changing or stopping your normal medicines. This is important if you take diabetes medicines or blood thinners.  Plan to have someone take you home from the hospital or clinic. What happens during the procedure?  An IV tube may be put into one of your veins.  You will be given medicine to help you relax (sedative).  To reduce your risk of infection: ? Your doctors will wash their hands. ? Your anal area will be washed with soap.  You will be asked to lie on your side with your knees bent.  Your doctor will get a long, thin, flexible tube ready. The tube will have a camera and a light on the end.  The tube will be put into your anus.  The tube will be  gently put into your large intestine.  Air will be delivered into your large intestine to keep it open. You may feel some pressure or cramping.  The camera will be used to take photos.  A small tissue sample may be removed from your body to be looked at under a microscope (biopsy). If any possible problems are found, the tissue will be sent to a lab for testing.  If small growths are found, your doctor may remove them and have them checked for cancer.  The tube that was put into your anus will be slowly removed. The procedure may vary among doctors and hospitals. What happens after the procedure?  Your doctor will check on you often until the medicines you were given have worn  off.  Do not drive for 24 hours after the procedure.  You may have a small amount of blood in your poop.  You may pass gas.  You may have mild cramps or bloating in your belly (abdomen).  It is up to you to get the results of your procedure. Ask your doctor, or the department performing the procedure, when your results will be ready. This information is not intended to replace advice given to you by your health care provider. Make sure you discuss any questions you have with your health care provider. Document Released: 03/30/2010 Document Revised: 12/27/2015 Document Reviewed: 05/09/2015 Elsevier Interactive Patient Education  2017 [employer].

## 2017-12-18 NOTE — Progress Notes (Signed)
Subjective:   Gregory Hansen is a 67 y.o. male who presents for an Initial Medicare Annual Wellness Visit. The patient was informed that the wellness visit is to identify future health risk and educate and initiate measures that can reduce risk for increased disease through the lifespan.   Review of Systems  Physical assessment deferred to PCP.  Cardiac Risk Factors include: advanced age (>40men, >6 women);hypertension;male gender   Objective:    Today's Vitals   12/18/17 1548  BP: 130/80  Pulse: (!) 58  Temp: 97.9 F (36.6 C)  TempSrc: Oral  SpO2: 98%  Weight: 248 lb 6.4 oz (112.7 kg)  Height: 6\' 2"  (1.88 m)  PainSc: 0-No pain   Body mass index is 31.89 kg/m.  Advanced Directives 12/18/2017 02/18/2017 01/20/2017 12/11/2016 12/03/2016 12/02/2016 11/22/2016  Does Patient Have a Medical Advance Directive? Yes No No No No No No  Type of Paramedic of [place];Living will - - - - - -  Does patient want to make changes to medical advance directive? - - - - - - -  Copy of [place] in Chart? No - copy requested - - - - - -  Would patient like information on creating a medical advance directive? - No - Patient declined No - Patient declined No - Patient declined No - Patient declined - Yes (Inpatient - patient requests chaplain consult to create a medical advance directive)    Current Medications (verified) Outpatient Encounter Medications as of 12/18/2017  Medication Sig  . aspirin 81 MG tablet Take 81 mg by mouth every evening.   Marland Kitchen atorvastatin (LIPITOR) 80 MG tablet Take 1 tablet (80 mg total) by mouth daily.  Marland Kitchen buPROPion (WELLBUTRIN XL) 300 MG 24 hr tablet Take 300 mg by mouth daily.  . cetirizine (KLS ALLER-TEC) 10 MG tablet Take 10 mg by mouth daily.  . fluticasone (FLONASE) 50 MCG/ACT nasal spray Place 2 sprays into both nostrils daily.  . metoprolol succinate (TOPROL-XL) 50 MG 24 hr tablet Take 1 tablet (50 mg total) by mouth  daily.  . nefazodone (SERZONE) 200 MG tablet Take 200 mg daily by mouth.  . nitroGLYCERIN (NITROSTAT) 0.4 MG SL tablet Place 1 tablet (0.4 mg total) every 5 (five) minutes as needed under the tongue for chest pain. Up to 3 doses  . pantoprazole (PROTONIX) 40 MG tablet Take 1 tablet (40 mg total) by mouth daily.  . ramipril (ALTACE) 10 MG capsule Take 1 capsule (10 mg total) by mouth 2 (two) times daily.  . sildenafil (REVATIO) 20 MG tablet TAKE 2-5 TABLETS BY MOUTH AS NEEDED PRIOR TO SEXUAL ACTIVITY  . vitamin B-12 (CYANOCOBALAMIN) 1000 MCG tablet Take 1,000 mcg by mouth daily.  . Multiple Vitamin (MULTIVITAMIN WITH MINERALS) TABS tablet Take 1 tablet by mouth daily.   No facility-administered encounter medications on file as of 12/18/2017.     Allergies (verified) Patient has no known allergies.   History: Past Medical History:  Diagnosis Date  . Anginal pain ([place])   . Anxiety   . Arthritis    SPINE   . Arthritis   . Coronary artery disease    s/p multiple percutaneous interventions, PCI 202 for DMI and DES distal RCA and CFX-OM 2006  . Coronary artery disease   . Depression   . GERD (gastroesophageal reflux disease)   . Hyperlipidemia    mixed  . Hyperlipidemia   . Hypertension   . MI (myocardial infarction) ([place])   .  Myocardial infarction ([place])   . Peripheral neuropathy    calves and both feet  . Peripheral neuropathy    calves and both feet  . Spondylitis, ankylosing ([place])    Past Surgical History:  Procedure Laterality Date  . CARDIAC CATHETERIZATION     stent RCA June3, 2002, Stent OM/PTCA circumflex August 13, 2000  . CORONARY ANGIOPLASTY WITH STENT PLACEMENT     first one in 2006; had another place 3 months ago (August 2013)  . CORONARY ANGIOPLASTY WITH STENT PLACEMENT  03/26/2013   RCA           DR COOPER  . CORONARY ANGIOPLASTY WITH STENT PLACEMENT    . CORONARY ARTERY BYPASS GRAFT N/A 10/29/2013   Procedure: CORONARY ARTERY BYPASS GRAFTING x 4 (LIMA-LAD,  SVG-OM, SVG-PD-PL) ENDOSCOPIC VEIN HARVEST RIGHT THIGH;  Surgeon: Gaye Pollack, MD;  Location: [facility] OR;  Service: Open Heart Surgery;  Laterality: N/A;  . CORONARY ARTERY BYPASS GRAFT    . FINGER SURGERY     5  TH     DIGIT RIGHT HAND  . FINGER SURGERY    . INTRAOPERATIVE TRANSESOPHAGEAL ECHOCARDIOGRAM N/A 10/29/2013   Procedure: INTRAOPERATIVE TRANSESOPHAGEAL ECHOCARDIOGRAM;  Surgeon: Gaye Pollack, MD;  Location: [facility] OR;  Service: Open Heart Surgery;  Laterality: N/A;  . KNEE ARTHROSCOPY W/ LASER  2003  . LEFT HEART CATHETERIZATION WITH CORONARY ANGIOGRAM N/A 10/01/2011   Procedure: LEFT HEART CATHETERIZATION WITH CORONARY ANGIOGRAM;  Surgeon: [name], MD;  Location: [facility] CATH LAB;  Service: Cardiovascular;  Laterality: N/A;  . LEFT HEART CATHETERIZATION WITH CORONARY ANGIOGRAM N/A 03/26/2013   Procedure: LEFT HEART CATHETERIZATION WITH CORONARY ANGIOGRAM;  Surgeon: Blane Ohara, MD;  Location: [facility] CATH LAB;  Service: Cardiovascular;  Laterality: N/A;  . LEFT HEART CATHETERIZATION WITH CORONARY ANGIOGRAM N/A 10/25/2013   Procedure: LEFT HEART CATHETERIZATION WITH CORONARY ANGIOGRAM;  Surgeon: Blane Ohara, MD;  Location: [facility] CATH LAB;  Service: Cardiovascular;  Laterality: N/A;   Family History  Problem Relation Age of Onset  . Breast cancer Mother 74  . Heart disease Father   . Heart disease Brother   . Leukemia Maternal Grandmother   . Heart disease Maternal Grandfather   . Heart disease Unknown        positive for cardiac disease and both brothers have had cardiac events  . Heart attack Brother    Social History   Socioeconomic History  . Marital status: Divorced    Spouse name: n/a  . Number of children: 2  . Years of education: Masters degree  . Highest education level: Master's degree (e.g., MA, MS, MEng, MEd, MSW, MBA)  Occupational History  . 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 Financial resource strain: Not hard at all    . Food insecurity:    Worry: Never true    Inability: Never true  . Transportation needs:    Medical: No    Non-medical: No  Tobacco Use  . Smoking status: Former Smoker    Packs/day: 2.00    Years: 10.00    Pack years: 20.00    Types: Cigarettes    Last attempt to quit: 06/30/1979    Years since quitting: 38.4  . Smokeless tobacco: Never Used  . Tobacco comment: no plans to start back  Substance and Sexual Activity  . Alcohol use: Yes    Alcohol/week: 16.0 standard drinks    Types: 14 Glasses of wine, 2 Shots  of liquor per week    Comment: 3-4 glasses of wine at dinner everyday.  . Drug use: No  . Sexual activity: Not Currently    Birth control/protection: Condom  Lifestyle  . Physical activity:    Days per week: 0 days    Minutes per session: 0 min  . Stress: Not at all  Relationships  . Social connections:    Talks on phone: Three times a week    Gets together: Once a week    Attends religious service: Never    Active member of club or organization: No    Attends meetings of clubs or organizations: Never    Relationship status: Divorced  Other Topics Concern  . Not on file  Social History Narrative   ** Merged History Encounter **       Lives in a one level home with son, and dog, Scientist, physiological. Works out of the house. 2 steps into house. Smoke alarms in house. No grab bars in bathroom. Has some area rugs and throw rugs with backing.      No pets of his own. Restores antique pens, does like to walk and cycle but having problems with peripheral neuropathy.       Firearm is safely stored.      Wears seat belts in vehicle. Does use sunblock and tries to avoid sun due to vitiligo and previous sunburns.      Eats varied diet. Likes protein, fresh fruits and veggies. Watches fats, saturated fats. Drink a lot of water.    Tobacco Counseling Counseling given: Not Answered Comment: no plans to start back  Clinical Intake:  Pre-visit preparation completed: Yes  Pain :  No/denies pain Pain Score: 0-No pain   Nutritional Status: BMI > 30  Obese Diabetes: No  How often do you need to have someone help you when you read instructions, pamphlets, or other written materials from your doctor or pharmacy?: 1 - Never What is the last grade level you completed in school?: Master's Degree  Interpreter Needed?: No    Activities of Daily Living In your present state of health, do you have any difficulty performing the following activities: 12/18/2017  Hearing? N  Vision? N  Difficulty concentrating or making decisions? Y  Comment some short term memory issues; forgetting why went into a room  Walking or climbing stairs? N  Dressing or bathing? N  Doing errands, shopping? N  Preparing Food and eating ? N  Using the Toilet? N  In the past six months, have you accidently leaked urine? N  Do you have problems with loss of bowel control? N  Managing your Medications? N  Managing your Finances? N  Housekeeping or managing your Housekeeping? N  Some recent data might be hidden     Immunizations and Health Maintenance Immunization History  Administered Date(s) Administered  . Influenza, High Dose Seasonal PF 11/23/2016  . Influenza,inj,Quad PF,6+ Mos 12/18/2017  . Pneumococcal Conjugate-13 01/20/2017  . Pneumococcal Polysaccharide-23 07/01/2015  . Tdap 06/30/2015   Health Maintenance Due  Topic Date Due  . COLONOSCOPY  09/29/1999   Flu vaccine given today.  Patient Care Team: [name], DO as PCP - General (Family Medicine) [name], MD as PCP - Cardiology (Cardiology) Lorelee Market, MD as Referring Physician (Ophthalmology) Daune Perch, NP as Nurse Practitioner (Nurse Practitioner) Rolm Bookbinder, MD as Consulting Physician (Dermatology) Almedia Balls, MD as Referring Physician (Orthopedic Surgery)  Indicate any recent Medical Services you may have received from  other than [facility] providers in the past year (date may be  approximate).    Assessment:   This is a routine wellness examination for Teddy.  Hearing/Vision screen  Hearing Screening   Method: Audiometry   125Hz  250Hz  500Hz  1000Hz  2000Hz  3000Hz  4000Hz  6000Hz  8000Hz   Right ear:  Pass Pass Pass Pass  Pass    Left ear:  Pass Pass Pass Pass  Pass    Vision Screening Comments: Eye exam up to date with new glasses. Sees Dr. Marybelle Killings.  Dietary issues and exercise activities discussed: Current Exercise Habits: The patient does not participate in regular exercise at present, Exercise limited by: neurologic condition(s)  Goals   None    Depression Screen PHQ 2/9 Scores 12/18/2017 02/18/2017 01/20/2017 12/11/2016  PHQ - 2 Score 0 0 0 0    Fall Risk Fall Risk  12/18/2017 02/18/2017  Falls in the past year? Yes No  Number falls in past yr: 1 -  Injury with Fall? Yes -  Follow up Falls prevention discussed -  Comment Fall was while using a scooter with a cast. -    Is the patient's home free of loose throw rugs in walkways, pet beds, electrical cords, etc?   yes      Grab bars in the bathroom? yes      Handrails on the stairs?   yes      Adequate lighting?   yes   Cognitive Function: MMSE - Mini Mental State Exam 12/18/2017  Orientation to time 5  Orientation to Place 5  Registration 3  Attention/ Calculation 5  Recall 3  Language- name 2 objects 2  Language- repeat 1  Language- follow 3 step command 3  Language- read & follow direction 1  Write a sentence 1  Copy design 1  Total score 30     6CIT Screen 12/18/2017  What Year? 0 points  What month? 0 points  What time? 0 points  Count back from 20 0 points  Months in reverse 0 points  Repeat phrase 0 points  Total Score 0    Screening Tests Health Maintenance  Topic Date Due  . COLONOSCOPY  09/29/1999  . TETANUS/TDAP  06/29/2025  . INFLUENZA VACCINE  Completed  . Hepatitis C Screening  Completed  . PNA vac Low Risk Adult  Completed    Qualifies for Shingles  Vaccine? Info given   Cancer Screenings: Lung: Low Dose CT Chest recommended if Age 48-80 years, 30 pack-year currently smoking OR have quit w/in 15years. Patient does not qualify. Colorectal: referral entered  Additional Screenings:  Hepatitis C Screening: complete     Plan:  Flu vaccine updated today. Referral entered for colonoscopy. Info given on how to obtain Shingles vaccine.  I have personally reviewed and noted the following in the patient's chart:   . Medical and social history . Use of alcohol, tobacco or illicit drugs  . Current medications and supplements . Functional ability and status . Nutritional status . Physical activity . Advanced directives . List of other physicians . Hospitalizations, surgeries, and ER visits in previous 12 months . Vitals . Screenings to include cognitive, depression, and falls . Referrals and appointments  In addition, I have reviewed and discussed with patient certain preventive protocols, quality metrics, and best practice recommendations. A written personalized care plan for preventive services as well as general preventive health recommendations were provided to patient.     Esau Grew, RN   12/18/2017

## 2017-12-18 NOTE — Progress Notes (Signed)
I have reviewed this visit and discussed with Danley Danker, RN, and agree with her documentation.  [name], DO PGY-2, [facility] 12/18/2017 4:43 PM

## 2018-03-27 ENCOUNTER — Other Ambulatory Visit: Payer: Self-pay

## 2018-03-27 ENCOUNTER — Ambulatory Visit (INDEPENDENT_AMBULATORY_CARE_PROVIDER_SITE_OTHER): Payer: Medicare Other | Admitting: Family Medicine

## 2018-03-27 ENCOUNTER — Encounter: Payer: Self-pay | Admitting: Family Medicine

## 2018-03-27 VITALS — BP 144/76 | HR 92 | Temp 98.5°F | Ht 74.0 in | Wt 243.0 lb

## 2018-03-27 DIAGNOSIS — R197 Diarrhea, unspecified: Secondary | ICD-10-CM

## 2018-03-27 DIAGNOSIS — R52 Pain, unspecified: Secondary | ICD-10-CM | POA: Diagnosis not present

## 2018-03-27 DIAGNOSIS — R1110 Vomiting, unspecified: Secondary | ICD-10-CM | POA: Diagnosis not present

## 2018-03-27 LAB — POCT INFLUENZA A/B
Influenza A, POC: NEGATIVE
Influenza B, POC: NEGATIVE

## 2018-03-27 MED ORDER — ONDANSETRON 4 MG PO TBDP: 4.0000 mg | ORAL_TABLET | Freq: Three times a day (TID) | ORAL | 0 refills | Status: DC | PRN

## 2018-03-27 NOTE — Progress Notes (Signed)
   CC: diarrhea  HPI  Feels bad - he is worried about the Crypto again. He has some green watery diarrhea. Hasn't taken his temp. This all started weds or thurs last week. Started with some congestion with aches and pain, chills as well. Last night he was sweaty and shaking this morning. Actually feels like he got better yesterday and then got worse again today. Mild cough, dry heaving. Heaved up some water and bile this morning. Off and on vomiting since onset. Does have GERD at baseline. Vomiting about 3 times so far today, maybe 1-2 yesterday. Diarrhea started yesterday. Ate new vegan chicken salad yesterday. Lives with son - he seemed to be sick last week as well. Has a dry mouth.   ROS: Denies CP, SOB, abdominal pain, dysuria, changes in BMs.   CC, SH/smoking status, and VS noted  Objective: BP (!) 144/76   Pulse 92   Temp 98.5 F (36.9 C) (Oral)   Ht 6\' 2"  (1.88 m)   Wt 243 lb (110.2 kg)   SpO2 95%   BMI 31.20 kg/m  Gen: NAD, alert, cooperative, and pleasant. HEENT: NCAT, EOMI, PERRL, MMM CV: RRR, no murmur Resp: CTAB, no wheezes, non-labored Abd: SNTND, BS present, no guarding or organomegaly Ext: No edema, warm Neuro: Alert and oriented, Speech clear, No gross deficits  Assessment and plan:  Vomiting, diarrhea: well hydrated on exam.  Suspect viral etiology.  This does not seem to be as severe as his previous cryptococcus infection.  We will trial Zofran and continued home p.o. hydration.  Check BMP for any electrolyte derangements.  Flu was negative.  Return if no improvement next week.  Orders Placed This Encounter  Procedures  . Basic metabolic panel  . Influenza A/B    Meds ordered this encounter  Medications  . ondansetron (ZOFRAN-ODT) 4 MG disintegrating tablet    Sig: Take 1 tablet (4 mg total) by mouth every 8 (eight) hours as needed for nausea or vomiting.    Dispense:  20 tablet    Refill:  0     Ralene Ok, MD, PGY3 03/30/2018 10:27 AM

## 2018-03-27 NOTE — Patient Instructions (Signed)
It was a pleasure to see you today! Thank you for choosing [facility] for your primary care. Gregory Hansen was seen for body aches, vomiting, diarrhea.   Our plans for today were:  Keep up the good work trying to stay hydrated. Use the nausea medicine as needed. If you are no better on Monday, we need to know. If you feel like you are progressing in the right direction, you don't need another appt. We will call you if your labs are abnormal. If you get lots worse, go to the Emergency Room.   Best,  Dr. Lindell Noe

## 2018-03-28 LAB — BASIC METABOLIC PANEL
BUN / CREAT RATIO: 6 — AB (ref 10–24)
BUN: 6 mg/dL — ABNORMAL LOW (ref 8–27)
CO2: 25 mmol/L (ref 20–29)
CREATININE: 0.93 mg/dL (ref 0.76–1.27)
Calcium: 9.3 mg/dL (ref 8.6–10.2)
Chloride: 91 mmol/L — ABNORMAL LOW (ref 96–106)
GFR, EST AFRICAN AMERICAN: 97 mL/min/{1.73_m2} (ref >59)
GFR, EST NON AFRICAN AMERICAN: 84 mL/min/{1.73_m2} (ref >59)
GLUCOSE: 117 mg/dL — AB (ref 65–99)
Potassium: 4.2 mmol/L (ref 3.5–5.2)
SODIUM: 133 mmol/L — AB (ref 134–144)

## 2018-03-30 ENCOUNTER — Other Ambulatory Visit: Payer: Self-pay

## 2018-03-30 ENCOUNTER — Encounter (HOSPITAL_COMMUNITY): Payer: Self-pay

## 2018-03-30 ENCOUNTER — Emergency Department (HOSPITAL_COMMUNITY): Payer: Medicare Other

## 2018-03-30 ENCOUNTER — Ambulatory Visit (HOSPITAL_COMMUNITY)
Admission: EM | Admit: 2018-03-30 | Discharge: 2018-03-30 | Disposition: A | Discharge status: Home / Self Care | Payer: Medicare Other | Source: Home / Self Care

## 2018-03-30 ENCOUNTER — Inpatient Hospital Stay (HOSPITAL_COMMUNITY)
Admission: EM | Admit: 2018-03-30 | Discharge: 2018-04-05 | DRG: 871 | Disposition: A | Discharge status: Home / Self Care | Payer: Medicare Other | Attending: Internal Medicine | Admitting: Internal Medicine

## 2018-03-30 ENCOUNTER — Telehealth: Payer: Self-pay

## 2018-03-30 DIAGNOSIS — R6520 Severe sepsis without septic shock: Secondary | ICD-10-CM

## 2018-03-30 DIAGNOSIS — M25571 Pain in right ankle and joints of right foot: Secondary | ICD-10-CM | POA: Diagnosis not present

## 2018-03-30 DIAGNOSIS — Z7951 Long term (current) use of inhaled steroids: Secondary | ICD-10-CM

## 2018-03-30 DIAGNOSIS — I959 Hypotension, unspecified: Secondary | ICD-10-CM | POA: Diagnosis not present

## 2018-03-30 DIAGNOSIS — N170 Acute kidney failure with tubular necrosis: Secondary | ICD-10-CM | POA: Diagnosis present

## 2018-03-30 DIAGNOSIS — E871 Hypo-osmolality and hyponatremia: Secondary | ICD-10-CM | POA: Diagnosis present

## 2018-03-30 DIAGNOSIS — G629 Polyneuropathy, unspecified: Secondary | ICD-10-CM | POA: Diagnosis present

## 2018-03-30 DIAGNOSIS — I252 Old myocardial infarction: Secondary | ICD-10-CM | POA: Diagnosis not present

## 2018-03-30 DIAGNOSIS — L03115 Cellulitis of right lower limb: Secondary | ICD-10-CM | POA: Diagnosis not present

## 2018-03-30 DIAGNOSIS — N179 Acute kidney failure, unspecified: Secondary | ICD-10-CM | POA: Diagnosis not present

## 2018-03-30 DIAGNOSIS — R739 Hyperglycemia, unspecified: Secondary | ICD-10-CM | POA: Diagnosis present

## 2018-03-30 DIAGNOSIS — Z955 Presence of coronary angioplasty implant and graft: Secondary | ICD-10-CM

## 2018-03-30 DIAGNOSIS — K7200 Acute and subacute hepatic failure without coma: Secondary | ICD-10-CM | POA: Diagnosis not present

## 2018-03-30 DIAGNOSIS — I2510 Atherosclerotic heart disease of native coronary artery without angina pectoris: Secondary | ICD-10-CM | POA: Diagnosis not present

## 2018-03-30 DIAGNOSIS — E785 Hyperlipidemia, unspecified: Secondary | ICD-10-CM | POA: Diagnosis present

## 2018-03-30 DIAGNOSIS — Z951 Presence of aortocoronary bypass graft: Secondary | ICD-10-CM | POA: Diagnosis not present

## 2018-03-30 DIAGNOSIS — K219 Gastro-esophageal reflux disease without esophagitis: Secondary | ICD-10-CM | POA: Diagnosis present

## 2018-03-30 DIAGNOSIS — Z7982 Long term (current) use of aspirin: Secondary | ICD-10-CM

## 2018-03-30 DIAGNOSIS — I891 Lymphangitis: Secondary | ICD-10-CM | POA: Diagnosis not present

## 2018-03-30 DIAGNOSIS — I878 Other specified disorders of veins: Secondary | ICD-10-CM | POA: Diagnosis not present

## 2018-03-30 DIAGNOSIS — A419 Sepsis, unspecified organism: Principal | ICD-10-CM | POA: Diagnosis present

## 2018-03-30 DIAGNOSIS — Z87891 Personal history of nicotine dependence: Secondary | ICD-10-CM | POA: Diagnosis not present

## 2018-03-30 DIAGNOSIS — Z791 Long term (current) use of non-steroidal anti-inflammatories (NSAID): Secondary | ICD-10-CM

## 2018-03-30 DIAGNOSIS — Z8249 Family history of ischemic heart disease and other diseases of the circulatory system: Secondary | ICD-10-CM

## 2018-03-30 DIAGNOSIS — M1990 Unspecified osteoarthritis, unspecified site: Secondary | ICD-10-CM | POA: Diagnosis present

## 2018-03-30 DIAGNOSIS — F329 Major depressive disorder, single episode, unspecified: Secondary | ICD-10-CM | POA: Diagnosis present

## 2018-03-30 DIAGNOSIS — Z23 Encounter for immunization: Secondary | ICD-10-CM

## 2018-03-30 DIAGNOSIS — L0390 Cellulitis, unspecified: Secondary | ICD-10-CM | POA: Diagnosis present

## 2018-03-30 DIAGNOSIS — E876 Hypokalemia: Secondary | ICD-10-CM | POA: Diagnosis present

## 2018-03-30 DIAGNOSIS — R6521 Severe sepsis with septic shock: Secondary | ICD-10-CM | POA: Diagnosis not present

## 2018-03-30 DIAGNOSIS — E860 Dehydration: Secondary | ICD-10-CM | POA: Diagnosis not present

## 2018-03-30 DIAGNOSIS — M79671 Pain in right foot: Secondary | ICD-10-CM | POA: Diagnosis not present

## 2018-03-30 DIAGNOSIS — M79661 Pain in right lower leg: Secondary | ICD-10-CM | POA: Diagnosis not present

## 2018-03-30 DIAGNOSIS — I10 Essential (primary) hypertension: Secondary | ICD-10-CM | POA: Diagnosis present

## 2018-03-30 DIAGNOSIS — Z79899 Other long term (current) drug therapy: Secondary | ICD-10-CM

## 2018-03-30 DIAGNOSIS — F419 Anxiety disorder, unspecified: Secondary | ICD-10-CM | POA: Diagnosis present

## 2018-03-30 HISTORY — DX: Acute kidney failure, unspecified: N17.9

## 2018-03-30 HISTORY — DX: Cellulitis, unspecified: L03.90

## 2018-03-30 LAB — CBC WITH DIFFERENTIAL/PLATELET
Abs Immature Granulocytes: 0.11 10*3/uL — ABNORMAL HIGH (ref 0.00–0.07)
Basophils Absolute: 0.1 10*3/uL (ref 0.0–0.1)
Basophils Relative: 1 %
Eosinophils Absolute: 0.3 10*3/uL (ref 0.0–0.5)
Eosinophils Relative: 2 %
HCT: 39 % (ref 39.0–52.0)
Hemoglobin: 12.9 g/dL — ABNORMAL LOW (ref 13.0–17.0)
Immature Granulocytes: 1 %
Lymphocytes Relative: 6 %
Lymphs Abs: 0.7 10*3/uL (ref 0.7–4.0)
MCH: 31.6 pg (ref 26.0–34.0)
MCHC: 33.1 g/dL (ref 30.0–36.0)
MCV: 95.6 fL (ref 80.0–100.0)
MONO ABS: 1 10*3/uL (ref 0.1–1.0)
Monocytes Relative: 8 %
Neutro Abs: 10.3 10*3/uL — ABNORMAL HIGH (ref 1.7–7.7)
Neutrophils Relative %: 82 %
Platelets: 151 10*3/uL (ref 150–400)
RBC: 4.08 MIL/uL — ABNORMAL LOW (ref 4.22–5.81)
RDW: 14.5 % (ref 11.5–15.5)
WBC: 12.5 10*3/uL — ABNORMAL HIGH (ref 4.0–10.5)
nRBC: 0 % (ref 0.0–0.2)

## 2018-03-30 LAB — COMPREHENSIVE METABOLIC PANEL
ALT: 64 U/L — ABNORMAL HIGH (ref 0–44)
AST: 114 U/L — ABNORMAL HIGH (ref 15–41)
Albumin: 2.3 g/dL — ABNORMAL LOW (ref 3.5–5.0)
Alkaline Phosphatase: 112 U/L (ref 38–126)
Anion gap: 14 (ref 5–15)
BILIRUBIN TOTAL: 1.9 mg/dL — AB (ref 0.3–1.2)
BUN: 29 mg/dL — ABNORMAL HIGH (ref 8–23)
CO2: 22 mmol/L (ref 22–32)
Calcium: 7.7 mg/dL — ABNORMAL LOW (ref 8.9–10.3)
Chloride: 89 mmol/L — ABNORMAL LOW (ref 98–111)
Creatinine, Ser: 4.02 mg/dL — ABNORMAL HIGH (ref 0.61–1.24)
GFR calc Af Amer: 17 mL/min — ABNORMAL LOW (ref >60)
GFR, EST NON AFRICAN AMERICAN: 14 mL/min — AB (ref >60)
Glucose, Bld: 223 mg/dL — ABNORMAL HIGH (ref 70–99)
Potassium: 2.6 mmol/L — CL (ref 3.5–5.1)
Sodium: 125 mmol/L — ABNORMAL LOW (ref 135–145)
Total Protein: 7.5 g/dL (ref 6.5–8.1)

## 2018-03-30 LAB — I-STAT CG4 LACTIC ACID, ED
LACTIC ACID, VENOUS: 3.27 mmol/L — AB (ref 0.5–1.9)
LACTIC ACID, VENOUS: 1.86 mmol/L (ref 0.5–1.9)

## 2018-03-30 LAB — PROTIME-INR
INR: 1.24
Prothrombin Time: 15.5 seconds — ABNORMAL HIGH (ref 11.4–15.2)

## 2018-03-30 LAB — CK: Total CK: 89 U/L (ref 49–397)

## 2018-03-30 LAB — C-REACTIVE PROTEIN: CRP: 21.2 mg/dL — ABNORMAL HIGH (ref <1.0)

## 2018-03-30 LAB — SEDIMENTATION RATE: Sed Rate: 68 mm/hr — ABNORMAL HIGH (ref 0–16)

## 2018-03-30 MED ORDER — PIPERACILLIN-TAZOBACTAM 3.375 G IVPB 30 MIN
3.3750 g | Freq: Once | INTRAVENOUS | Status: AC
  Administered 2018-03-30: 3.375 g via INTRAVENOUS
  Filled 2018-03-30: qty 50

## 2018-03-30 MED ORDER — ATORVASTATIN CALCIUM 80 MG PO TABS
80.0000 mg | ORAL_TABLET | Freq: Every day | ORAL | Status: DC
  Administered 2018-03-31: 80 mg via ORAL
  Filled 2018-03-30: qty 1

## 2018-03-30 MED ORDER — VANCOMYCIN HCL 10 G IV SOLR
2000.0000 mg | Freq: Once | INTRAVENOUS | Status: AC
  Administered 2018-03-30: 2000 mg via INTRAVENOUS
  Filled 2018-03-30: qty 2000

## 2018-03-30 MED ORDER — POTASSIUM CHLORIDE CRYS ER 20 MEQ PO TBCR
40.0000 meq | EXTENDED_RELEASE_TABLET | Freq: Once | ORAL | Status: AC
  Administered 2018-03-30: 40 meq via ORAL
  Filled 2018-03-30: qty 2

## 2018-03-30 MED ORDER — LACTATED RINGERS IV BOLUS
1000.0000 mL | Freq: Once | INTRAVENOUS | Status: AC
  Administered 2018-03-30: 1000 mL via INTRAVENOUS

## 2018-03-30 MED ORDER — HYDROCODONE-ACETAMINOPHEN 5-325 MG PO TABS
1.0000 | ORAL_TABLET | Freq: Once | ORAL | Status: AC
  Administered 2018-03-31: 1 via ORAL
  Filled 2018-03-30: qty 1

## 2018-03-30 MED ORDER — POTASSIUM CHLORIDE 10 MEQ/100ML IV SOLN
10.0000 meq | INTRAVENOUS | Status: AC
  Administered 2018-03-30 – 2018-03-31 (×4): 10 meq via INTRAVENOUS
  Filled 2018-03-30 (×4): qty 100

## 2018-03-30 MED ORDER — BUPROPION HCL ER (XL) 150 MG PO TB24
300.0000 mg | ORAL_TABLET | Freq: Every day | ORAL | Status: DC
  Administered 2018-03-31 – 2018-04-05 (×6): 300 mg via ORAL
  Filled 2018-03-30 (×6): qty 2

## 2018-03-30 MED ORDER — LACTATED RINGERS IV SOLN: INTRAVENOUS | Status: DC

## 2018-03-30 MED ORDER — VANCOMYCIN VARIABLE DOSE PER UNSTABLE RENAL FUNCTION (PHARMACIST DOSING): Status: DC

## 2018-03-30 MED ORDER — PANTOPRAZOLE SODIUM 40 MG PO TBEC
40.0000 mg | DELAYED_RELEASE_TABLET | Freq: Every day | ORAL | Status: DC
  Administered 2018-03-31 – 2018-04-05 (×6): 40 mg via ORAL
  Filled 2018-03-30 (×6): qty 1

## 2018-03-30 MED ORDER — CLINDAMYCIN PHOSPHATE 900 MG/50ML IV SOLN
900.0000 mg | Freq: Once | INTRAVENOUS | Status: AC
  Administered 2018-03-30: 900 mg via INTRAVENOUS
  Filled 2018-03-30: qty 50

## 2018-03-30 MED ORDER — POTASSIUM CHLORIDE CRYS ER 20 MEQ PO TBCR: 40.0000 meq | EXTENDED_RELEASE_TABLET | Freq: Once | ORAL | Status: DC

## 2018-03-30 MED ORDER — PIPERACILLIN-TAZOBACTAM 3.375 G IVPB
3.3750 g | Freq: Three times a day (TID) | INTRAVENOUS | Status: DC
  Administered 2018-03-31 – 2018-04-04 (×13): 3.375 g via INTRAVENOUS
  Filled 2018-03-30 (×16): qty 50

## 2018-03-30 MED ORDER — NEFAZODONE HCL 200 MG PO TABS
200.0000 mg | ORAL_TABLET | Freq: Two times a day (BID) | ORAL | Status: DC
  Administered 2018-03-31 – 2018-04-05 (×12): 200 mg via ORAL
  Filled 2018-03-30 (×12): qty 1

## 2018-03-30 MED ORDER — SODIUM CHLORIDE 0.9 % IV SOLN
INTRAVENOUS | Status: DC
  Administered 2018-03-30 – 2018-04-02 (×4): via INTRAVENOUS

## 2018-03-30 MED ORDER — SODIUM CHLORIDE 0.9% FLUSH
3.0000 mL | Freq: Once | INTRAVENOUS | Status: AC
  Administered 2018-03-31: 3 mL via INTRAVENOUS

## 2018-03-30 NOTE — ED Notes (Signed)
Hassan Rowan, emt called [facility].  Lucina Mellow notified ED

## 2018-03-30 NOTE — ED Triage Notes (Signed)
Pt her from urgent care with cellulitis to right leg.  Redness noted to right leg.  A&Ox4, inially hypotensive 50N systolic.  Last bp 112/80.

## 2018-03-30 NOTE — ED Notes (Signed)
Pt has received 1000 mL normal saline from urgent care PTA.  Pt has red streaks going into right groin.  Pulses dopplered.  Full movement and sensation to the foot.

## 2018-03-30 NOTE — ED Notes (Signed)
Patient reports he is feeling better than on arrival to ucc.  Patient's right leg is red, swollen, and painful.  Very red lower leg, red streaks visible on right upper leg.  Legs are equally cool.

## 2018-03-30 NOTE — ED Triage Notes (Signed)
Pt cc right leg redness and painful. Pt states he feels dizzy. The dizziness just started while sitting here.

## 2018-03-30 NOTE — Consult Note (Signed)
Reason for Consult:right leg cellulitis Referring Physician: Dr Almon Hercules is an 68 y.o. male.  HPI: Gregory Hansen is a patient with complicated past medical history who reports worsening erythema over his baseline venous stasis in the right lower extremity beginning Saturday 2 days ago.  Denies any history of trauma or injury to that right foot or leg.  He had tendon harvest in that right foot when he was a child and had vein harvest in that right leg for his coronary artery bypass grafting.  Currently he is on aspirin only for DVT prophylaxis.  Denies any fevers or chills but did present to urgent care today with blood pressure in the 60s.  Work-up in the emergency room today demonstrates normal radiographs of the right lower extremity but progressive cellulitis affecting the right leg.  He has been given IV antibiotics.  Lactic acid on arrival 3.27 but it has decreased over 2 hours to 1.86.  Potassium also low.  Creatinine has increased to 4.02.  He has been volume resuscitated.  Past Medical History:  Diagnosis Date  . Anginal pain ([place])   . Anxiety   . Arthritis    SPINE   . Arthritis   . Coronary artery disease    s/p multiple percutaneous interventions, PCI 202 for DMI and DES distal RCA and CFX-OM 2006  . Coronary artery disease   . Depression   . GERD (gastroesophageal reflux disease)   . Hyperlipidemia    mixed  . Hyperlipidemia   . Hypertension   . MI (myocardial infarction) ([place])   . Myocardial infarction ([place])   . Peripheral neuropathy    calves and both feet  . Peripheral neuropathy    calves and both feet  . Spondylitis, ankylosing ([place])     Past Surgical History:  Procedure Laterality Date  . CARDIAC CATHETERIZATION     stent RCA June3, 2002, Stent OM/PTCA circumflex August 13, 2000  . CORONARY ANGIOPLASTY WITH STENT PLACEMENT     first one in 2006; had another place 3 months ago (August 2013)  . CORONARY ANGIOPLASTY WITH STENT PLACEMENT  03/26/2013   RCA            DR COOPER  . CORONARY ANGIOPLASTY WITH STENT PLACEMENT    . CORONARY ARTERY BYPASS GRAFT N/A 10/29/2013   Procedure: CORONARY ARTERY BYPASS GRAFTING x 4 (LIMA-LAD, SVG-OM, SVG-PD-PL) ENDOSCOPIC VEIN HARVEST RIGHT THIGH;  Surgeon: Gaye Pollack, MD;  Location: [facility] OR;  Service: Open Heart Surgery;  Laterality: N/A;  . CORONARY ARTERY BYPASS GRAFT    . FINGER SURGERY     5  TH     DIGIT RIGHT HAND  . FINGER SURGERY    . INTRAOPERATIVE TRANSESOPHAGEAL ECHOCARDIOGRAM N/A 10/29/2013   Procedure: INTRAOPERATIVE TRANSESOPHAGEAL ECHOCARDIOGRAM;  Surgeon: Gaye Pollack, MD;  Location: [facility] OR;  Service: Open Heart Surgery;  Laterality: N/A;  . KNEE ARTHROSCOPY W/ LASER  2003  . LEFT HEART CATHETERIZATION WITH CORONARY ANGIOGRAM N/A 10/01/2011   Procedure: LEFT HEART CATHETERIZATION WITH CORONARY ANGIOGRAM;  Surgeon: [name], MD;  Location: [facility] CATH LAB;  Service: Cardiovascular;  Laterality: N/A;  . LEFT HEART CATHETERIZATION WITH CORONARY ANGIOGRAM N/A 03/26/2013   Procedure: LEFT HEART CATHETERIZATION WITH CORONARY ANGIOGRAM;  Surgeon: Blane Ohara, MD;  Location: [facility] CATH LAB;  Service: Cardiovascular;  Laterality: N/A;  . LEFT HEART CATHETERIZATION WITH CORONARY ANGIOGRAM N/A 10/25/2013   Procedure: LEFT HEART CATHETERIZATION WITH CORONARY ANGIOGRAM;  Surgeon: Blane Ohara, MD;  Location: [facility] CATH LAB;  Service: Cardiovascular;  Laterality: N/A;    Family History  Problem Relation Age of Onset  . Breast cancer Mother 29  . Heart disease Father   . Heart disease Brother   . Leukemia Maternal Grandmother   . Heart disease Maternal Grandfather   . Heart disease Other        positive for cardiac disease and both brothers have had cardiac events  . Heart attack Brother     Social History:  reports that he quit smoking about 38 years ago. His smoking use included cigarettes. He has a 20.00 pack-year smoking history. He has never used smokeless tobacco. He reports current alcohol use of  about 16.0 standard drinks of alcohol per week. He reports that he does not use drugs.  Allergies: No Known Allergies  Medications: I have reviewed the patient's current medications.  Results for orders placed or performed during the hospital encounter of 03/30/18 (from the past 48 hour(s))  Comprehensive metabolic panel     Status: Abnormal   Collection Time: 03/30/18  9:15 PM  Result Value Ref Range   Sodium 125 (L) 135 - 145 mmol/L   Potassium 2.6 (LL) 3.5 - 5.1 mmol/L    Comment: CRITICAL RESULT CALLED TO, READ BACK BY AND VERIFIED WITH: FUNK B,RN 03/30/18 2210 WAYK    Chloride 89 (L) 98 - 111 mmol/L   CO2 22 22 - 32 mmol/L   Glucose, Bld 223 (H) 70 - 99 mg/dL   BUN 29 (H) 8 - 23 mg/dL   Creatinine, Ser 4.02 (H) 0.61 - 1.24 mg/dL   Calcium 7.7 (L) 8.9 - 10.3 mg/dL   Total Protein 7.5 6.5 - 8.1 g/dL   Albumin 2.3 (L) 3.5 - 5.0 g/dL   AST 114 (H) 15 - 41 U/L   ALT 64 (H) 0 - 44 U/L   Alkaline Phosphatase 112 38 - 126 U/L   Total Bilirubin 1.9 (H) 0.3 - 1.2 mg/dL   GFR calc non Af Amer 14 (L) >60 mL/min   GFR calc Af Amer 17 (L) >60 mL/min   Anion gap 14 5 - 15    Comment: Performed at [facility], [address], [place], [place] [address]  CBC with Differential     Status: Abnormal   Collection Time: 03/30/18  9:15 PM  Result Value Ref Range   WBC 12.5 (H) 4.0 - 10.5 K/uL   RBC 4.08 (L) 4.22 - 5.81 MIL/uL   Hemoglobin 12.9 (L) 13.0 - 17.0 g/dL   HCT 39.0 39.0 - 52.0 %   MCV 95.6 80.0 - 100.0 fL   MCH 31.6 26.0 - 34.0 pg   MCHC 33.1 30.0 - 36.0 g/dL   RDW 14.5 11.5 - 15.5 %   Platelets 151 150 - 400 K/uL   nRBC 0.0 0.0 - 0.2 %   Neutrophils Relative % 82 %   Neutro Abs 10.3 (H) 1.7 - 7.7 K/uL   Lymphocytes Relative 6 %   Lymphs Abs 0.7 0.7 - 4.0 K/uL   Monocytes Relative 8 %   Monocytes Absolute 1.0 0.1 - 1.0 K/uL   Eosinophils Relative 2 %   Eosinophils Absolute 0.3 0.0 - 0.5 K/uL   Basophils Relative 1 %   Basophils Absolute 0.1 0.0 - 0.1 K/uL    Immature Granulocytes 1 %   Abs Immature Granulocytes 0.11 (H) 0.00 - 0.07 K/uL    Comment: Performed at [facility]  [address] [place], [place] [address]  Protime-INR     Status: Abnormal   Collection Time: 03/30/18  9:15 PM  Result Value Ref Range   Prothrombin Time 15.5 (H) 11.4 - 15.2 seconds   INR 1.24     Comment: Performed at [facility] [address], [place], [place] [address]  C-reactive protein     Status: Abnormal   Collection Time: 03/30/18  9:15 PM  Result Value Ref Range   CRP 21.2 (H) <1.0 mg/dL    Comment: Performed at [facility] [address], [place], [place] [address]  Sedimentation rate     Status: Abnormal   Collection Time: 03/30/18  9:15 PM  Result Value Ref Range   Sed Rate 68 (H) 0 - 16 mm/hr    Comment: Performed at [facility] [address], [place], [place] [address]  CK     Status: None   Collection Time: 03/30/18  9:15 PM  Result Value Ref Range   Total CK 89 49 - 397 U/L    Comment: Performed at [facility], [place] [address], [place], [place] [address]  I-Stat CG4 Lactic Acid, ED     Status: Abnormal   Collection Time: 03/30/18  9:17 PM  Result Value Ref Range   Lactic Acid, Venous 3.27 (HH) 0.5 - 1.9 mmol/L   Comment NOTIFIED PHYSICIAN   I-Stat CG4 Lactic Acid, ED     Status: None   Collection Time: 03/30/18 11:48 PM  Result Value Ref Range   Lactic Acid, Venous 1.86 0.5 - 1.9 mmol/L    Dg Tibia/fibula Right  Result Date: 03/30/2018 CLINICAL DATA:  Initial evaluation for acute right lower extremity pain. EXAM: RIGHT TIBIA AND FIBULA - 2 VIEW COMPARISON:  Prior radiograph from 08/17/2007. FINDINGS: No acute fracture or dislocation. Moderate osteoarthritic changes noted about the knee. No acute soft tissue abnormality. Scattered vascular calcifications noted within the leg. IMPRESSION: No acute osseous abnormality about the right tibia/fibula. Electronically Signed   By: Jeannine Boga M.D.   On:  03/30/2018 22:48   Dg Ankle 2 Views Right  Result Date: 03/30/2018 CLINICAL DATA:  Initial evaluation for acute right lower extremity pain. EXAM: RIGHT ANKLE - 2 VIEW COMPARISON:  None. FINDINGS: No acute fracture or dislocation. Talar dome intact. Ankle mortise grossly approximated. Osseous mineralization normal. No acute soft tissue abnormality. Scattered vascular calcifications noted. IMPRESSION: No acute osseous abnormality about the right ankle. Electronically Signed   By: Jeannine Boga M.D.   On: 03/30/2018 22:47   Dg Foot 2 Views Right  Result Date: 03/30/2018 CLINICAL DATA:  Initial evaluation for acute right lower extremity pain. EXAM: RIGHT FOOT - 2 VIEW COMPARISON:  None. FINDINGS: No acute fracture dislocation. Joint spaces fairly well-maintained without significant degenerative or erosive arthropathy. Osseous mineralization normal. No acute soft tissue abnormality. Scattered vascular calcifications noted anterior to the right foot and ankle. IMPRESSION: No acute osseous abnormality about the right foot. Electronically Signed   By: Jeannine Boga M.D.   On: 03/30/2018 22:50    Review of Systems  Constitutional: Positive for malaise/fatigue.  Musculoskeletal: Positive for joint pain.   Blood pressure 128/71, pulse 76, temperature 97.9 F (36.6 C), temperature source Oral, resp. rate (!) 23, SpO2 97 %. Physical Exam  Constitutional: He appears well-developed.  HENT:  Head: Normocephalic.  Eyes: Pupils are equal, round, and reactive to light.  Neck: Normal range of motion.  Cardiovascular: Normal rate.  Respiratory: Effort  normal.  Neurological: He is alert.  Skin: Skin is warm.  Psychiatric: He has a normal mood and affect.  Examination of the right lower extremity demonstrates cellulitis primarily anteriorly and circumferentially to the equator of the right leg.  Compartments are soft.  Ankle dorsiflexion on the right is intact along with plantarflexion.  I do not  detect any abscess or fluctuance in that right leg.  He does have an area of erythema up around the groin as well.  No effusion in the right knee.  Ankle range of motion is nontender.  He does have peripheral neuropathy in both lower extremities.  Pedal pulses not particularly palpable but dopplerable bilateral lower extremities.  The patient does have some improvement in the cellulitis with elevation of the right leg.  Assessment/Plan: Impression is right lower extremity cellulitis with sepsis and increasing creatinine.  Patient has been volume resuscitated and has received 1 dose of IV antibiotics.  Currently on my exam there is no fluctuance erythema or tissue crepitus.  Not sure that there is anything surgically drainable in the leg although the clinical appearance is impressive for cellulitis.  Recommend noncontrast MRI scan of that right leg just to look a little bit closer for evidence of a surgically drainable lesion.  If not the medical management should continue.  Continue n.p.o. for now.  Landry Dyke Dean 03/30/2018, 11:59 PM

## 2018-03-30 NOTE — Telephone Encounter (Signed)
-----   Message from Sela Hilding, MD sent at 03/30/2018 10:29 AM EST ----- Please let patient know his labs look good. Hopefully he is feeling better! He should come back and be seen if not.

## 2018-03-30 NOTE — ED Provider Notes (Signed)
[place] EMERGENCY DEPARTMENT Provider Note   CSN: [id number] Arrival date & time: 03/30/18  2050   History   Chief Complaint Chief Complaint  Patient presents with  . Hypotension  . Cellulitis    HPI Gregory Hansen is a 68 y.o. male.  HPI   Gregory Hansen is a 68 y.o. male with PMH of CAD status post four-vessel CABG, depression, GERD, HLD, HTN, peripheral neuropathy, ankylosing spondylitis, anxiety who presents with significant erythema to his right lower extremity.  Patient reports that on Saturday he noticed a small worsening of the baseline erythema he has in his right lower extremity.  He Hoyes has some edema in his foot as this was the location of his vein harvest for his bypass.  However, he states it is rapidly progressed.  It moved up his foot and lower extremity over the last few days and then when he awoke this morning it had moved significantly further from his leg distal to the knee to the level of the thigh and right groin.  He has had some nausea and nonbloody nonbilious emesis.  Myalgias and arthralgias.  No fever at home.  States he had the flu or flulike symptoms about a week ago which resolved on Friday and Saturday prior to onset of lower extremity pain.  He has had no abdominal pain or urinary symptoms.  Past Medical History:  Diagnosis Date  . Anginal pain ([place])   . Anxiety   . Arthritis    SPINE   . Arthritis   . Coronary artery disease    s/p multiple percutaneous interventions, PCI 202 for DMI and DES distal RCA and CFX-OM 2006  . Coronary artery disease   . Depression   . GERD (gastroesophageal reflux disease)   . Hyperlipidemia    mixed  . Hyperlipidemia   . Hypertension   . MI (myocardial infarction) ([place])   . Myocardial infarction ([place])   . Peripheral neuropathy    calves and both feet  . Peripheral neuropathy    calves and both feet  . Spondylitis, ankylosing [facility])     Patient Active Problem List   Diagnosis Date  Noted  . Cellulitis 03/30/2018  . Acute kidney failure ([place]) 03/30/2018  . Peripheral neuropathy 11/04/2017  . Gynecomastia, male 02/18/2017  . Healthcare maintenance 01/20/2017  . Severe sepsis with acute organ dysfunction ([place]) 12/03/2016  . Aortic atherosclerosis ([place]) 12/03/2016  . Diarrhea due to cryptosporidium ([place])   . S/P CABG x 4 10/29/2013  . Hyperlipidemia with target LDL less than 70 06/15/2008  . DEPRESSION 06/15/2008  . HYPERTENSION, BENIGN 06/15/2008  . CAD (coronary artery disease) 06/15/2008  . GERD 06/15/2008  . SPONDYLITIS, ANKYLOSING 06/15/2008  . Atherosclerotic heart disease of native coronary artery without angina pectoris 06/15/2008  . Gastro-esophageal reflux disease without esophagitis 06/15/2008    Past Surgical History:  Procedure Laterality Date  . CARDIAC CATHETERIZATION     stent RCA June3, 2002, Stent OM/PTCA circumflex August 13, 2000  . CORONARY ANGIOPLASTY WITH STENT PLACEMENT     first one in 2006; had another place 3 months ago (August 2013)  . CORONARY ANGIOPLASTY WITH STENT PLACEMENT  03/26/2013   RCA           DR COOPER  . CORONARY ANGIOPLASTY WITH STENT PLACEMENT    . CORONARY ARTERY BYPASS GRAFT N/A 10/29/2013   Procedure: CORONARY ARTERY BYPASS GRAFTING x 4 (LIMA-LAD, SVG-OM, SVG-PD-PL) ENDOSCOPIC VEIN HARVEST RIGHT THIGH;  Surgeon:  Gaye Pollack, MD;  Location: [place];  Service: Open Heart Surgery;  Laterality: N/A;  . CORONARY ARTERY BYPASS GRAFT    . FINGER SURGERY     5  TH     DIGIT RIGHT HAND  . FINGER SURGERY    . INTRAOPERATIVE TRANSESOPHAGEAL ECHOCARDIOGRAM N/A 10/29/2013   Procedure: INTRAOPERATIVE TRANSESOPHAGEAL ECHOCARDIOGRAM;  Surgeon: Gaye Pollack, MD;  Location: [facility] OR;  Service: Open Heart Surgery;  Laterality: N/A;  . KNEE ARTHROSCOPY W/ LASER  2003  . LEFT HEART CATHETERIZATION WITH CORONARY ANGIOGRAM N/A 10/01/2011   Procedure: LEFT HEART CATHETERIZATION WITH CORONARY ANGIOGRAM;  Surgeon: [name], MD;  Location:  [facility] CATH LAB;  Service: Cardiovascular;  Laterality: N/A;  . LEFT HEART CATHETERIZATION WITH CORONARY ANGIOGRAM N/A 03/26/2013   Procedure: LEFT HEART CATHETERIZATION WITH CORONARY ANGIOGRAM;  Surgeon: Blane Ohara, MD;  Location: [facility] CATH LAB;  Service: Cardiovascular;  Laterality: N/A;  . LEFT HEART CATHETERIZATION WITH CORONARY ANGIOGRAM N/A 10/25/2013   Procedure: LEFT HEART CATHETERIZATION WITH CORONARY ANGIOGRAM;  Surgeon: Blane Ohara, MD;  Location: [facility] CATH LAB;  Service: Cardiovascular;  Laterality: N/A;        Home Medications    Prior to Admission medications   Medication Sig Start Date End Date Taking? Authorizing Provider  aspirin 81 MG tablet Take 81 mg by mouth every evening.    Yes [provider]  atorvastatin (LIPITOR) 80 MG tablet Take 1 tablet (80 mg total) by mouth daily. Patient taking differently: Take 80 mg by mouth at bedtime.  11/04/17  Yes Daune Perch, NP  buPROPion (WELLBUTRIN XL) 300 MG 24 hr tablet Take 300 mg by mouth daily.   Yes [provider]  fluticasone (FLONASE) 50 MCG/ACT nasal spray Place 2 sprays into both nostrils every evening.    Yes [provider]  ibuprofen (ADVIL,MOTRIN) 200 MG tablet Take 400 mg by mouth at bedtime.    Yes [provider]  loratadine (CLARITIN) 10 MG tablet Take 10 mg by mouth daily.   Yes [provider]  metoprolol succinate (TOPROL-XL) 50 MG 24 hr tablet Take 1 tablet (50 mg total) by mouth daily. 11/04/17  Yes Daune Perch, NP  nefazodone (SERZONE) 200 MG tablet Take 200 mg by mouth 2 (two) times daily.    Yes [provider]  nitroGLYCERIN (NITROSTAT) 0.4 MG SL tablet Place 1 tablet (0.4 mg total) every 5 (five) minutes as needed under the tongue for chest pain. Up to 3 doses Patient taking differently: Place 0.4 mg under the tongue every 5 (five) minutes x 3 doses as needed for chest pain.  01/20/17  Yes Rumball, Bryson Ha, DO  ondansetron (ZOFRAN-ODT) 4 MG  disintegrating tablet Take 1 tablet (4 mg total) by mouth every 8 (eight) hours as needed for nausea or vomiting. 03/27/18  Yes Sela Hilding, MD  pantoprazole (PROTONIX) 40 MG tablet Take 1 tablet (40 mg total) by mouth daily. 11/04/17  Yes Daune Perch, NP  ramipril (ALTACE) 10 MG capsule Take 1 capsule (10 mg total) by mouth 2 (two) times daily. 11/04/17  Yes Daune Perch, NP  sildenafil (REVATIO) 20 MG tablet TAKE 2-5 TABLETS BY MOUTH AS NEEDED PRIOR TO SEXUAL ACTIVITY Patient taking differently: Take 40-100 mg by mouth as needed (prior to sexual activity).  12/17/16  Yes [name], MD  vitamin B-12 (CYANOCOBALAMIN) 1000 MCG tablet Take 1,000 mcg by mouth daily.   Yes [provider]    Family History Family History  Problem  Relation Age of Onset  . Breast cancer Mother 52  . Heart disease Father   . Heart disease Brother   . Leukemia Maternal Grandmother   . Heart disease Maternal Grandfather   . Heart disease Other        positive for cardiac disease and both brothers have had cardiac events  . Heart attack Brother     Social History Social History   Tobacco Use  . Smoking status: Former Smoker    Packs/day: 2.00    Years: 10.00    Pack years: 20.00    Types: Cigarettes    Last attempt to quit: 06/30/1979    Years since quitting: 38.7  . Smokeless tobacco: Never Used  . Tobacco comment: no plans to start back  Substance Use Topics  . Alcohol use: Yes    Alcohol/week: 16.0 standard drinks    Types: 14 Glasses of wine, 2 Shots of liquor per week    Comment: 3-4 glasses of wine at dinner everyday.  . Drug use: No     Allergies   Patient has no known allergies.   Review of Systems Review of Systems  Constitutional: Positive for activity change, appetite change and fatigue. Negative for chills and fever.  HENT: Negative for ear pain and sore throat.   Eyes: Negative for pain and visual disturbance.  Respiratory: Negative for cough and  shortness of breath.   Cardiovascular: Positive for leg swelling. Negative for chest pain and palpitations.  Gastrointestinal: Positive for nausea and vomiting. Negative for abdominal pain.  Genitourinary: Negative for dysuria and hematuria.  Musculoskeletal: Positive for arthralgias and myalgias. Negative for back pain.  Skin: Positive for color change, rash and wound.  Neurological: Negative for seizures and syncope.  All other systems reviewed and are negative.    Physical Exam Updated Vital Signs BP 128/71   Pulse 76   Temp 97.9 F (36.6 C) (Oral)   Resp (!) 23   SpO2 97%   Physical Exam Vitals signs and nursing note reviewed.  Constitutional:      Appearance: Normal appearance. He is well-developed. He is ill-appearing.  HENT:     Head: Normocephalic and atraumatic.  Eyes:     Conjunctiva/sclera: Conjunctivae normal.  Neck:     Musculoskeletal: Neck supple.  Cardiovascular:     Rate and Rhythm: Normal rate and regular rhythm.     Pulses:          Dorsalis pedis pulses are detected w/ Doppler on the right side.     Heart sounds: S1 normal and S2 normal. No murmur.  Pulmonary:     Effort: Pulmonary effort is normal. No respiratory distress.     Breath sounds: Normal breath sounds.  Abdominal:     Palpations: Abdomen is soft.     Tenderness: There is no abdominal tenderness.  Musculoskeletal:        General: Swelling and tenderness present.     Right hip: He exhibits normal range of motion and normal strength.     Right lower leg: Edema (1+ pitting, compartments soft) present.       Legs:     Comments: No crepitus or fluctuance noted on exam.  Skin:    General: Skin is warm and dry.     Capillary Refill: Capillary refill takes 2 to 3 seconds.     Findings: Bruising and erythema present.  Neurological:     Mental Status: He is alert.  Psychiatric:  Behavior: Behavior is cooperative.            ED Treatments / Results  Labs (all labs ordered  are listed, but only abnormal results are displayed) Labs Reviewed  COMPREHENSIVE METABOLIC PANEL - Abnormal; Notable for the following components:      Result Value   Sodium 125 (*)    Potassium 2.6 (*)    Chloride 89 (*)    Glucose, Bld 223 (*)    BUN 29 (*)    Creatinine, Ser 4.02 (*)    Calcium 7.7 (*)    Albumin 2.3 (*)    AST 114 (*)    ALT 64 (*)    Total Bilirubin 1.9 (*)    GFR calc non Af Amer 14 (*)    GFR calc Af Amer 17 (*)    All other components within normal limits  CBC WITH DIFFERENTIAL/PLATELET - Abnormal; Notable for the following components:   WBC 12.5 (*)    RBC 4.08 (*)    Hemoglobin 12.9 (*)    Neutro Abs 10.3 (*)    Abs Immature Granulocytes 0.11 (*)    All other components within normal limits  PROTIME-INR - Abnormal; Notable for the following components:   Prothrombin Time 15.5 (*)    All other components within normal limits  C-REACTIVE PROTEIN - Abnormal; Notable for the following components:   CRP 21.2 (*)    All other components within normal limits  SEDIMENTATION RATE - Abnormal; Notable for the following components:   Sed Rate 68 (*)    All other components within normal limits  I-STAT CG4 LACTIC ACID, ED - Abnormal; Notable for the following components:   Lactic Acid, Venous 3.27 (*)    All other components within normal limits  CULTURE, BLOOD (ROUTINE X 2)  CULTURE, BLOOD (ROUTINE X 2)  CK  URINALYSIS, ROUTINE W REFLEX MICROSCOPIC  BASIC METABOLIC PANEL  I-STAT CG4 LACTIC ACID, ED    EKG None  Radiology Dg Tibia/fibula Right  Result Date: 03/30/2018 CLINICAL DATA:  Initial evaluation for acute right lower extremity pain. EXAM: RIGHT TIBIA AND FIBULA - 2 VIEW COMPARISON:  Prior radiograph from 08/17/2007. FINDINGS: No acute fracture or dislocation. Moderate osteoarthritic changes noted about the knee. No acute soft tissue abnormality. Scattered vascular calcifications noted within the leg. IMPRESSION: No acute osseous abnormality  about the right tibia/fibula. Electronically Signed   By: Jeannine Boga M.D.   On: 03/30/2018 22:48   Dg Ankle 2 Views Right  Result Date: 03/30/2018 CLINICAL DATA:  Initial evaluation for acute right lower extremity pain. EXAM: RIGHT ANKLE - 2 VIEW COMPARISON:  None. FINDINGS: No acute fracture or dislocation. Talar dome intact. Ankle mortise grossly approximated. Osseous mineralization normal. No acute soft tissue abnormality. Scattered vascular calcifications noted. IMPRESSION: No acute osseous abnormality about the right ankle. Electronically Signed   By: Jeannine Boga M.D.   On: 03/30/2018 22:47   Dg Foot 2 Views Right  Result Date: 03/30/2018 CLINICAL DATA:  Initial evaluation for acute right lower extremity pain. EXAM: RIGHT FOOT - 2 VIEW COMPARISON:  None. FINDINGS: No acute fracture dislocation. Joint spaces fairly well-maintained without significant degenerative or erosive arthropathy. Osseous mineralization normal. No acute soft tissue abnormality. Scattered vascular calcifications noted anterior to the right foot and ankle. IMPRESSION: No acute osseous abnormality about the right foot. Electronically Signed   By: Jeannine Boga M.D.   On: 03/30/2018 22:50    Procedures Procedures (including critical care time)  Medications Ordered  in ED Medications  sodium chloride flush (NS) 0.9 % injection 3 mL (has no administration in time range)  vancomycin (VANCOCIN) 2,000 mg in sodium chloride 0.9 % 500 mL IVPB (2,000 mg Intravenous New Bag/Given 03/30/18 2302)  potassium chloride 10 mEq in 100 mL IVPB (10 mEq Intravenous New Bag/Given 03/31/18 0005)  atorvastatin (LIPITOR) tablet 80 mg (has no administration in time range)  buPROPion (WELLBUTRIN XL) 24 hr tablet 300 mg (has no administration in time range)  nefazodone (SERZONE) tablet 200 mg (has no administration in time range)  pantoprazole (PROTONIX) EC tablet 40 mg (has no administration in time range)  0.9 %  sodium  chloride infusion ( Intravenous New Bag/Given 03/30/18 2303)  piperacillin-tazobactam (ZOSYN) IVPB 3.375 g (has no administration in time range)  vancomycin variable dose per unstable renal function (pharmacist dosing) (has no administration in time range)  lactated ringers bolus 1,000 mL (0 mLs Intravenous Stopped 03/30/18 2345)  piperacillin-tazobactam (ZOSYN) IVPB 3.375 g (0 g Intravenous Stopped 03/30/18 2247)  clindamycin (CLEOCIN) IVPB 900 mg (0 mg Intravenous Stopped 03/30/18 2247)  lactated ringers bolus 1,000 mL (0 mLs Intravenous Stopped 03/30/18 2345)  potassium chloride SA (K-DUR,KLOR-CON) CR tablet 40 mEq (40 mEq Oral Given 03/30/18 2256)  HYDROcodone-acetaminophen (NORCO/VICODIN) 5-325 MG per tablet 1 tablet (1 tablet Oral Given 03/31/18 0004)     Initial Impression / Assessment and Plan / ED Course  I have reviewed the triage vital signs and the nursing notes.  Pertinent labs & imaging results that were available during my care of the patient were reviewed by me and considered in my medical decision making (see chart for details).      MDM:  Imaging: X-ray of the right knee, tib-fib, foot show no acute pathology.  MRI of the lower extremity pending.  ED Provider Interpretation of EKG: None indicated at this time  Labs: Blood culture x2 in process, CK 89, ESR 68, CRP 21, CBC with a white count of 12.5 and a left shift, CMP with K of 2.6, sodium 125, chloride 89, BUN 29 and creatinine 4, lactate 3.27 downtrending to 1.86  On initial evaluation, patient appears ill. Afebrile but hypotensive in the 94W systolic on initial arrival.  Alert and oriented x4, pleasant, and cooperative.  Patient presents with right lower extremity pain and erythema as detailed above in the HPI.  On exam, patient has significant erythema over the right foot and tib-fib as well as the right groin which is less impressive.  There is area of erythema consistent with lymphangitis connecting these 2 portions.   Dopplerable dorsalis pedis pulse on the right with normal sensation.  No palpable crepitus or fluctuance.  No tenderness around the margins of his erythema in the normal-appearing tissue.  However, given his severity of illness with severe acute kidney injury and profound appearing cellulitis/lymphangitis there was concern initially for necrotizing fasciitis.  Thorough GU exam shows no evidence for Fournier's gangrene.  He is not diabetic.  No evidence for septic arthritis of the hip, knee, or ankle at this time.  Started on clindamycin, Zosyn, and vancomycin.  Given 2 L of IV LR and started on 100 cc of IV LR an hour.  Blood cultures collected as above.  Initial lactic acid 3.27 but down trended to 1.86.  Given 40 mEq of p.o. potassium in the ED.  Offered analgesics and declined.  Found to have septic shock with profound hypotension initially that was fluid responsive.  No indication for vasopressors at this time.  X-ray showed no gas and only shows soft tissue edema.  However, Lrinec score 10 raising suspicion.  Hospitalist consulted for admission to [facility] as patient clinically appeared significantly improved and had no complaints.  General surgery was consulted and counseled as to follow-up with orthopedic surgery.  Orthopedic surgery was consulted but Dr. Dellis Filbert was contacted by secretary although he was no longer on call. After speaking to him Dr. Marlou Sa was consulted and evaluated the patient in the emergency department.  MRI of the right tib-fib was ordered and pending at time of transfer of care to oncoming team at around midnight.  Please see their notes for additional details.  The plan for this patient was discussed with Dr. Ronnald Nian who voiced agreement and who oversaw evaluation and treatment of this patient.   The patient was fully informed and involved with the history taking, evaluation, workup including labs/images, and plan. The patient's concerns and questions were addressed to the patient's  satisfaction and he expressed agreement with the plan to admit.   Final Clinical Impressions(s) / ED Diagnoses   Final diagnoses:  Cellulitis of right leg  Lymphangitis  AKI (acute kidney injury) ([place])  Dehydration  Septic shock ([place])  Sepsis with acute renal failure and septic shock, due to unspecified organism, unspecified acute renal failure type [facility])    ED Discharge Orders    None       Mitchel Delduca, Rodena Goldmann, MD 03/31/18 0015    Lennice Sites, DO 03/31/18 0020

## 2018-03-30 NOTE — ED Notes (Signed)
Report called to ED  

## 2018-03-30 NOTE — ED Notes (Signed)
Patient transported to X-ray 

## 2018-03-30 NOTE — ED Provider Notes (Signed)
[place]    CSN: [id number] Arrival date & time: 03/30/18  1926     History   Chief Complaint Chief Complaint  Patient presents with  . right leg pain and sore    HPI RODRIGUEZ AGUINALDO is a 68 y.o. male.   Patient is a 68 year old male with past medical history of anxiety, arthritis, coronary artery disease, multiple stent placement, MI, hypertension, CABG hyperlipidemia, GERD.  He presents with approximately 2 to 3 days of worsening redness to the right lower extremity.  The redness started in the foot and ankle area and now has progressed and extended up into the right upper leg and into the right thigh area. The leg is warm to touch with increased warmth. He is having pain mostly in the calf area. Today he has been dizzy, light headed and had some diaphoresis. He drove here and felt dizzy upon standing. Upon arrival here he was very diaphoretic and BP found to be 65/48 initially. Most likely pt is septic and needs transferred to the ED. Denies any chest pain, SOB, hx of PE or DVT. He reports he is on plavix.   ROS per HPI       Past Medical History:  Diagnosis Date  . Anginal pain ([place])   . Anxiety   . Arthritis    SPINE   . Arthritis   . Coronary artery disease    s/p multiple percutaneous interventions, PCI 202 for DMI and DES distal RCA and CFX-OM 2006  . Coronary artery disease   . Depression   . GERD (gastroesophageal reflux disease)   . Hyperlipidemia    mixed  . Hyperlipidemia   . Hypertension   . MI (myocardial infarction) ([place])   . Myocardial infarction ([place])   . Peripheral neuropathy    calves and both feet  . Peripheral neuropathy    calves and both feet  . Spondylitis, ankylosing [facility])     Patient Active Problem List   Diagnosis Date Noted  . Peripheral neuropathy 11/04/2017  . Gynecomastia, male 02/18/2017  . Healthcare maintenance 01/20/2017  . Aortic atherosclerosis ([place]) 12/03/2016  . Diarrhea due to cryptosporidium ([place])   .  S/P CABG x 4 10/29/2013  . Hyperlipidemia with target LDL less than 70 06/15/2008  . DEPRESSION 06/15/2008  . HYPERTENSION, BENIGN 06/15/2008  . CAD (coronary artery disease) 06/15/2008  . GERD 06/15/2008  . SPONDYLITIS, ANKYLOSING 06/15/2008  . Atherosclerotic heart disease of native coronary artery without angina pectoris 06/15/2008  . Gastro-esophageal reflux disease without esophagitis 06/15/2008    Past Surgical History:  Procedure Laterality Date  . CARDIAC CATHETERIZATION     stent RCA June3, 2002, Stent OM/PTCA circumflex August 13, 2000  . CORONARY ANGIOPLASTY WITH STENT PLACEMENT     first one in 2006; had another place 3 months ago (August 2013)  . CORONARY ANGIOPLASTY WITH STENT PLACEMENT  03/26/2013   RCA           DR COOPER  . CORONARY ANGIOPLASTY WITH STENT PLACEMENT    . CORONARY ARTERY BYPASS GRAFT N/A 10/29/2013   Procedure: CORONARY ARTERY BYPASS GRAFTING x 4 (LIMA-LAD, SVG-OM, SVG-PD-PL) ENDOSCOPIC VEIN HARVEST RIGHT THIGH;  Surgeon: Gaye Pollack, MD;  Location: [facility] OR;  Service: Open Heart Surgery;  Laterality: N/A;  . CORONARY ARTERY BYPASS GRAFT    . FINGER SURGERY     5  TH     DIGIT RIGHT HAND  . FINGER SURGERY    . INTRAOPERATIVE  TRANSESOPHAGEAL ECHOCARDIOGRAM N/A 10/29/2013   Procedure: INTRAOPERATIVE TRANSESOPHAGEAL ECHOCARDIOGRAM;  Surgeon: Gaye Pollack, MD;  Location: [facility] OR;  Service: Open Heart Surgery;  Laterality: N/A;  . KNEE ARTHROSCOPY W/ LASER  2003  . LEFT HEART CATHETERIZATION WITH CORONARY ANGIOGRAM N/A 10/01/2011   Procedure: LEFT HEART CATHETERIZATION WITH CORONARY ANGIOGRAM;  Surgeon: [name], MD;  Location[facility] CATH LAB;  Service: Cardiovascular;  Laterality: N/A;  . LEFT HEART CATHETERIZATION WITH CORONARY ANGIOGRAM N/A 03/26/2013   Procedure: LEFT HEART CATHETERIZATION WITH CORONARY ANGIOGRAM;  Surgeon: Blane Ohara, MD;  Location: [facility] CATH LAB;  Service: Cardiovascular;  Laterality: N/A;  . LEFT HEART CATHETERIZATION WITH CORONARY  ANGIOGRAM N/A 10/25/2013   Procedure: LEFT HEART CATHETERIZATION WITH CORONARY ANGIOGRAM;  Surgeon: Blane Ohara, MD;  Location: [facility] CATH LAB;  Service: Cardiovascular;  Laterality: N/A;       Home Medications    Prior to Admission medications   Medication Sig Start Date End Date Taking? Authorizing Provider  aspirin 81 MG tablet Take 81 mg by mouth every evening.     [provider]  atorvastatin (LIPITOR) 80 MG tablet Take 1 tablet (80 mg total) by mouth daily. 11/04/17   Daune Perch, NP  buPROPion (WELLBUTRIN XL) 300 MG 24 hr tablet Take 300 mg by mouth daily.    [provider]  cetirizine (KLS ALLER-TEC) 10 MG tablet Take 10 mg by mouth daily.    [provider]  fluticasone (FLONASE) 50 MCG/ACT nasal spray Place 2 sprays into both nostrils daily.    [provider]  metoprolol succinate (TOPROL-XL) 50 MG 24 hr tablet Take 1 tablet (50 mg total) by mouth daily. 11/04/17   Daune Perch, NP  Multiple Vitamin (MULTIVITAMIN WITH MINERALS) TABS tablet Take 1 tablet by mouth daily.    [provider]  nefazodone (SERZONE) 200 MG tablet Take 200 mg daily by mouth.    [provider]  nitroGLYCERIN (NITROSTAT) 0.4 MG SL tablet Place 1 tablet (0.4 mg total) every 5 (five) minutes as needed under the tongue for chest pain. Up to 3 doses 01/20/17   [name], DO  ondansetron (ZOFRAN-ODT) 4 MG disintegrating tablet Take 1 tablet (4 mg total) by mouth every 8 (eight) hours as needed for nausea or vomiting. 03/27/18   Sela Hilding, MD  pantoprazole (PROTONIX) 40 MG tablet Take 1 tablet (40 mg total) by mouth daily. 11/04/17   Daune Perch, NP  ramipril (ALTACE) 10 MG capsule Take 1 capsule (10 mg total) by mouth 2 (two) times daily. 11/04/17   Daune Perch, NP  sildenafil (REVATIO) 20 MG tablet TAKE 2-5 TABLETS BY MOUTH AS NEEDED PRIOR TO SEXUAL ACTIVITY 12/17/16   [name], MD  vitamin B-12 (CYANOCOBALAMIN) 1000 MCG  tablet Take 1,000 mcg by mouth daily.    [provider]    Family History Family History  Problem Relation Age of Onset  . Breast cancer Mother 47  . Heart disease Father   . Heart disease Brother   . Leukemia Maternal Grandmother   . Heart disease Maternal Grandfather   . Heart disease Other        positive for cardiac disease and both brothers have had cardiac events  . Heart attack Brother     Social History Social History   Tobacco Use  . Smoking status: Former Smoker    Packs/day: 2.00    Years: 10.00    Pack years: 20.00    Types: Cigarettes  Last attempt to quit: 06/30/1979    Years since quitting: 38.7  . Smokeless tobacco: Never Used  . Tobacco comment: no plans to start back  Substance Use Topics  . Alcohol use: Yes    Alcohol/week: 16.0 standard drinks    Types: 14 Glasses of wine, 2 Shots of liquor per week    Comment: 3-4 glasses of wine at dinner everyday.  . Drug use: No     Allergies   Patient has no known allergies.   Review of Systems Review of Systems   Physical Exam Triage Vital Signs ED Triage Vitals  Enc Vitals Group     BP 03/30/18 2000 (!) 65/48     Pulse Rate 03/30/18 2000 88     Resp 03/30/18 2000 16     Temp 03/30/18 2000 97.9 F (36.6 C)     Temp src --      SpO2 03/30/18 2000 97 %     Weight 03/30/18 1959 245 lb (111.1 kg)     Height --      Head Circumference --      Peak Flow --      Pain Score 03/30/18 1959 8     Pain Loc --      Pain Edu? --      Excl. in [place]? --    No data found.  Updated Vital Signs BP (!) 92/56 (BP Location: Left Arm)   Pulse 79   Temp 97.9 F (36.6 C)   Resp 16   Wt 245 lb (111.1 kg)   SpO2 97%   BMI 31.46 kg/m   Visual Acuity Right Eye Distance:   Left Eye Distance:   Bilateral Distance:    Right Eye Near:   Left Eye Near:    Bilateral Near:     Physical Exam Vitals signs and nursing note reviewed.  Constitutional:      Appearance: He is well-developed. He is  ill-appearing and toxic-appearing.     Comments: Pale, diaphoretic   HENT:     Head: Normocephalic and atraumatic.  Eyes:     Conjunctiva/sclera: Conjunctivae normal.  Neck:     Musculoskeletal: Neck supple.  Cardiovascular:     Rate and Rhythm: Normal rate and regular rhythm.     Heart sounds: No murmur.  Pulmonary:     Effort: Pulmonary effort is normal. No respiratory distress.     Breath sounds: Normal breath sounds.  Abdominal:     Palpations: Abdomen is soft.     Tenderness: There is no abdominal tenderness.  Musculoskeletal:        General: Tenderness present.     Right lower leg: Edema present.  Skin:    General: Skin is warm and dry.     Findings: Erythema present.     Comments: Erythema and increased warmth in the RLE extending into the upper leg with streaking into the groin area.  1+ pitting edema  Tender to touch  Unable to palpate pedal pulse. Sensation intact.   Neurological:     Mental Status: He is alert.  Psychiatric:        Mood and Affect: Mood normal.      UC Treatments / Results  Labs (all labs ordered are listed, but only abnormal results are displayed) Labs Reviewed - No data to display  EKG None  Radiology No results found.  Procedures Procedures (including critical care time)  Medications Ordered in UC Medications - No data to display  Initial Impression /  Assessment and Plan / UC Course  I have reviewed the triage vital signs and the nursing notes.  Pertinent labs & imaging results that were available during my care of the patient were reviewed by me and considered in my medical decision making (see chart for details).     Sending pt to the ER for further evaluation and management.  Most likely sepsis from cellutlitis BP 92/56 upon discharge from the [facility].  Pt mentation well and dizziness improved.  He will most likely need hospital admission  Final Clinical Impressions(s) / UC Diagnoses   Final diagnoses:  Hypotension,  unspecified hypotension type  Cellulitis of right lower extremity   Discharge Instructions   None    ED Prescriptions    None     Controlled Substance Prescriptions [place] Controlled Substance Registry consulted? Not Applicable   Orvan July, NP 03/30/18 2044

## 2018-03-30 NOTE — ED Notes (Signed)
Dr. Curatolo called about Lactic results 

## 2018-03-30 NOTE — ED Notes (Signed)
Left DP, palpable, right DP is dopplerable (not palpable).

## 2018-03-30 NOTE — ED Notes (Signed)
[facility] has left with patient

## 2018-03-30 NOTE — Telephone Encounter (Signed)
LVM for pt to call the office. If pt calls, please give him the information below. Sharon T Saunders, CMA  

## 2018-03-30 NOTE — Progress Notes (Signed)
Pharmacy Antibiotic Note  Gregory Hansen is a 68 y.o. male admitted on 03/30/2018 with sepsis.  Pharmacy has been consulted for Vancomycin and Zosyn dosing.  Zosyn 3.375gm and Vanc 2gm IV already ordered in ED.  Pt with AKI - SCr up to 4.02, was 0.93 only 3 days ago.  Plan: Vancomycin 2gm IV now as already ordered. Further doses based on Scr Zosyn 3.375gm IV q8h-each dose over 4 hours Will f/u renal function, micro data, and pt's clinical condition    Temp (24hrs), Avg:97.9 F (36.6 C), Min:97.9 F (36.6 C), Max:97.9 F (36.6 C)  Recent Labs  Lab 03/27/18 1110 03/30/18 2115 03/30/18 2117  WBC  --  12.5*  --   CREATININE 0.93 4.02*  --   LATICACIDVEN  --   --  3.27*    Estimated Creatinine Clearance: 23.3 mL/min (A) (by C-G formula based on SCr of 4.02 mg/dL (H)).    No Known Allergies  Antimicrobials this admission: 1/20 Vanc >>  1/20 Zosyn >>   Microbiology results: 1/20 BCx:   Thank you for allowing pharmacy to be a part of this patient's care.  Sherlon Handing, PharmD, BCPS Clinical pharmacist  **Pharmacist phone directory can now be found on [email or web address] ([email or web address]).  Listed under [place]. 03/30/2018 10:18 PM

## 2018-03-30 NOTE — H&P (Addendum)
History and Physical    Gregory Hansen DGL:[id number] DOB: 07-21-1950 DOA: 03/30/2018  PCP: [name], DO  Patient coming from: Home  I have personally briefly reviewed patient's old medical records in [place]  Chief Complaint: Cellulitis  HPI: Gregory Hansen is a 68 y.o. male with medical history significant of CAD s/p CABG, HTN, HLD.  Patient has had 3-4 day history of RLE erythema.  Rapidly worsened overnight last night progressing up his leg.  Associated lightheadedness today.  Went to UC, they found him to have hypotension and patient was sent into the ED.   ED Course: Initial SBP in the 60s, has improved to 120s after IVF.  Started on zosyn, vanc, clinda.  Plain film X rays neg for subq air.  Has AKF with creat 4 up from 0.x a few days ago.  K 2.6, sodium 125, CRP 21.2.  Lactate 3.2 improves to 1.8 after IVF.  Of note the area of ecchymosis / necrosis on dorsal aspect of foot is new even since being at UC earlier today.   Review of Systems: As per HPI otherwise 10 point review of systems negative.   Past Medical History:  Diagnosis Date  . Anginal pain ([place])   . Anxiety   . Arthritis    SPINE   . Arthritis   . Coronary artery disease    s/p multiple percutaneous interventions, PCI 202 for DMI and DES distal RCA and CFX-OM 2006  . Coronary artery disease   . Depression   . GERD (gastroesophageal reflux disease)   . Hyperlipidemia    mixed  . Hyperlipidemia   . Hypertension   . MI (myocardial infarction) ([place])   . Myocardial infarction ([place])   . Peripheral neuropathy    calves and both feet  . Peripheral neuropathy    calves and both feet  . Spondylitis, ankylosing ([place])     Past Surgical History:  Procedure Laterality Date  . CARDIAC CATHETERIZATION     stent RCA June3, 2002, Stent OM/PTCA circumflex August 13, 2000  . CORONARY ANGIOPLASTY WITH STENT PLACEMENT     first one in 2006; had another place 3 months ago (August 2013)  . CORONARY  ANGIOPLASTY WITH STENT PLACEMENT  03/26/2013   RCA           DR COOPER  . CORONARY ANGIOPLASTY WITH STENT PLACEMENT    . CORONARY ARTERY BYPASS GRAFT N/A 10/29/2013   Procedure: CORONARY ARTERY BYPASS GRAFTING x 4 (LIMA-LAD, SVG-OM, SVG-PD-PL) ENDOSCOPIC VEIN HARVEST RIGHT THIGH;  Surgeon: Gaye Pollack, MD;  Location: [facility] OR;  Service: Open Heart Surgery;  Laterality: N/A;  . CORONARY ARTERY BYPASS GRAFT    . FINGER SURGERY     5  TH     DIGIT RIGHT HAND  . FINGER SURGERY    . INTRAOPERATIVE TRANSESOPHAGEAL ECHOCARDIOGRAM N/A 10/29/2013   Procedure: INTRAOPERATIVE TRANSESOPHAGEAL ECHOCARDIOGRAM;  Surgeon: Gaye Pollack, MD;  Location: [facility] OR;  Service: Open Heart Surgery;  Laterality: N/A;  . KNEE ARTHROSCOPY W/ LASER  2003  . LEFT HEART CATHETERIZATION WITH CORONARY ANGIOGRAM N/A 10/01/2011   Procedure: LEFT HEART CATHETERIZATION WITH CORONARY ANGIOGRAM;  Surgeon: [name], MD;  Location[facility] C[facility] LAB;  Service: Cardiovascular;  Laterality: N/A;  . LEFT HEART CATHETERIZATION WITH CORONARY ANGIOGRAM N/A 03/26/2013   Procedure: LEFT HEART CATHETERIZATION WITH CORONARY ANGIOGRAM;  Surgeon: Blane Ohara, MD;  Location: [facility] CATH LAB;  Service: Cardiovascular;  Laterality: N/A;  . LEFT HEART  CATHETERIZATION WITH CORONARY ANGIOGRAM N/A 10/25/2013   Procedure: LEFT HEART CATHETERIZATION WITH CORONARY ANGIOGRAM;  Surgeon: Blane Ohara, MD;  Location: [facility] CATH LAB;  Service: Cardiovascular;  Laterality: N/A;     reports that he quit smoking about 38 years ago. His smoking use included cigarettes. He has a 20.00 pack-year smoking history. He has never used smokeless tobacco. He reports current alcohol use of about 16.0 standard drinks of alcohol per week. He reports that he does not use drugs.  No Known Allergies  Family History  Problem Relation Age of Onset  . Breast cancer Mother 2  . Heart disease Father   . Heart disease Brother   . Leukemia Maternal Grandmother   . Heart disease  Maternal Grandfather   . Heart disease Other        positive for cardiac disease and both brothers have had cardiac events  . Heart attack Brother      Prior to Admission medications   Medication Sig Start Date End Date Taking? Authorizing Provider  aspirin 81 MG tablet Take 81 mg by mouth every evening.    Yes [provider]  atorvastatin (LIPITOR) 80 MG tablet Take 1 tablet (80 mg total) by mouth daily. Patient taking differently: Take 80 mg by mouth at bedtime.  11/04/17  Yes Daune Perch, NP  buPROPion (WELLBUTRIN XL) 300 MG 24 hr tablet Take 300 mg by mouth daily.   Yes [provider]  fluticasone (FLONASE) 50 MCG/ACT nasal spray Place 2 sprays into both nostrils every evening.    Yes [provider]  ibuprofen (ADVIL,MOTRIN) 200 MG tablet Take 400 mg by mouth at bedtime.    Yes [provider]  loratadine (CLARITIN) 10 MG tablet Take 10 mg by mouth daily.   Yes [provider]  metoprolol succinate (TOPROL-XL) 50 MG 24 hr tablet Take 1 tablet (50 mg total) by mouth daily. 11/04/17  Yes Daune Perch, NP  nefazodone (SERZONE) 200 MG tablet Take 200 mg by mouth 2 (two) times daily.    Yes [provider]  nitroGLYCERIN (NITROSTAT) 0.4 MG SL tablet Place 1 tablet (0.4 mg total) every 5 (five) minutes as needed under the tongue for chest pain. Up to 3 doses Patient taking differently: Place 0.4 mg under the tongue every 5 (five) minutes x 3 doses as needed for chest pain.  01/20/17  Yes Rumball, Bryson Ha, DO  ondansetron (ZOFRAN-ODT) 4 MG disintegrating tablet Take 1 tablet (4 mg total) by mouth every 8 (eight) hours as needed for nausea or vomiting. 03/27/18  Yes Sela Hilding, MD  pantoprazole (PROTONIX) 40 MG tablet Take 1 tablet (40 mg total) by mouth daily. 11/04/17  Yes Daune Perch, NP  ramipril (ALTACE) 10 MG capsule Take 1 capsule (10 mg total) by mouth 2 (two) times daily. 11/04/17  Yes Daune Perch, NP  sildenafil  (REVATIO) 20 MG tablet TAKE 2-5 TABLETS BY MOUTH AS NEEDED PRIOR TO SEXUAL ACTIVITY Patient taking differently: Take 40-100 mg by mouth as needed (prior to sexual activity).  12/17/16  Yes [name], MD  vitamin B-12 (CYANOCOBALAMIN) 1000 MCG tablet Take 1,000 mcg by mouth daily.   Yes [provider]    Physical Exam: Vitals:   03/30/18 2115 03/30/18 2145 03/30/18 2200 03/30/18 2241  BP: 118/61 111/66 124/74 121/71  Pulse: 79 80 79 81  Resp: (!) 24 20 (!) 29 19  Temp:      TempSrc:      SpO2: 98% 95% 97%  96%    Constitutional: NAD, calm, comfortable Eyes: PERRL, lids and conjunctivae normal ENMT: Mucous membranes are moist. Posterior pharynx clear of any exudate or lesions.Normal dentition.  Neck: normal, supple, no masses, no thyromegaly Respiratory: clear to auscultation bilaterally, no wheezing, no crackles. Normal respiratory effort. No accessory muscle use.  Cardiovascular: Regular rate and rhythm, no murmurs / rubs / gallops. No extremity edema. 2+ pedal pulses. No carotid bruits.  Abdomen: no tenderness, no masses palpated. No hepatosplenomegaly. Bowel sounds positive.  Musculoskeletal: no clubbing / cyanosis. No joint deformity upper and lower extremities. Good ROM, no contractures. Normal muscle tone.  Skin:     Neurologic: CN 2-12 grossly intact. Sensation intact, DTR normal. Strength 5/5 in all 4.  Psychiatric: Normal judgment and insight. Alert and oriented x 3. Normal mood.    Labs on Admission: I have personally reviewed following labs and imaging studies  CBC: Recent Labs  Lab 03/30/18 2115  WBC 12.5*  NEUTROABS 10.3*  HGB 12.9*  HCT 39.0  MCV 95.6  PLT 854   Basic Metabolic Panel: Recent Labs  Lab 03/27/18 1110 03/30/18 2115  NA 133* 125*  K 4.2 2.6*  CL 91* 89*  CO2 25 22  GLUCOSE 117* 223*  BUN 6* 29*  CREATININE 0.93 4.02*  CALCIUM 9.3 7.7*   GFR: Estimated Creatinine Clearance: 23.3 mL/min (A) (by C-G formula based on SCr  of 4.02 mg/dL (H)). Liver Function Tests: Recent Labs  Lab 03/30/18 2115  AST 114*  ALT 64*  ALKPHOS 112  BILITOT 1.9*  PROT 7.5  ALBUMIN 2.3*   No results for input(s): LIPASE, AMYLASE in the last 168 hours. No results for input(s): AMMONIA in the last 168 hours. Coagulation Profile: Recent Labs  Lab 03/30/18 2115  INR 1.24   Cardiac Enzymes: No results for input(s): CKTOTAL, CKMB, CKMBINDEX, TROPONINI in the last 168 hours. BNP (last 3 results) No results for input(s): PROBNP in the last 8760 hours. HbA1C: No results for input(s): HGBA1C in the last 72 hours. CBG: No results for input(s): GLUCAP in the last 168 hours. Lipid Profile: No results for input(s): CHOL, HDL, LDLCALC, TRIG, CHOLHDL, LDLDIRECT in the last 72 hours. Thyroid Function Tests: No results for input(s): TSH, T4TOTAL, FREET4, T3FREE, THYROIDAB in the last 72 hours. Anemia Panel: No results for input(s): VITAMINB12, FOLATE, FERRITIN, TIBC, IRON, RETICCTPCT in the last 72 hours. Urine analysis:    Component Value Date/Time   COLORURINE AMBER (A) 11/22/2016 1637   APPEARANCEUR CLOUDY (A) 11/22/2016 1637   LABSPEC 1.023 11/22/2016 1637   PHURINE 5.0 11/22/2016 1637   GLUCOSEU NEGATIVE 11/22/2016 1637   HGBUR NEGATIVE 11/22/2016 1637   BILIRUBINUR SMALL (A) 11/22/2016 1637   KETONESUR NEGATIVE 11/22/2016 1637   PROTEINUR 100 (A) 11/22/2016 1637   UROBILINOGEN 1.0 10/28/2013 1857   NITRITE NEGATIVE 11/22/2016 1637   LEUKOCYTESUR NEGATIVE 11/22/2016 1637    Radiological Exams on Admission: Dg Tibia/fibula Right  Result Date: 03/30/2018 CLINICAL DATA:  Initial evaluation for acute right lower extremity pain. EXAM: RIGHT TIBIA AND FIBULA - 2 VIEW COMPARISON:  Prior radiograph from 08/17/2007. FINDINGS: No acute fracture or dislocation. Moderate osteoarthritic changes noted about the knee. No acute soft tissue abnormality. Scattered vascular calcifications noted within the leg. IMPRESSION: No acute  osseous abnormality about the right tibia/fibula. Electronically Signed   By: Jeannine Boga M.D.   On: 03/30/2018 22:48   Dg Ankle 2 Views Right  Result Date: 03/30/2018 CLINICAL DATA:  Initial evaluation for acute right  lower extremity pain. EXAM: RIGHT ANKLE - 2 VIEW COMPARISON:  None. FINDINGS: No acute fracture or dislocation. Talar dome intact. Ankle mortise grossly approximated. Osseous mineralization normal. No acute soft tissue abnormality. Scattered vascular calcifications noted. IMPRESSION: No acute osseous abnormality about the right ankle. Electronically Signed   By: Jeannine Boga M.D.   On: 03/30/2018 22:47   Dg Foot 2 Views Right  Result Date: 03/30/2018 CLINICAL DATA:  Initial evaluation for acute right lower extremity pain. EXAM: RIGHT FOOT - 2 VIEW COMPARISON:  None. FINDINGS: No acute fracture dislocation. Joint spaces fairly well-maintained without significant degenerative or erosive arthropathy. Osseous mineralization normal. No acute soft tissue abnormality. Scattered vascular calcifications noted anterior to the right foot and ankle. IMPRESSION: No acute osseous abnormality about the right foot. Electronically Signed   By: Jeannine Boga M.D.   On: 03/30/2018 22:50    EKG: Independently reviewed.  Assessment/Plan Principal Problem:   Severe sepsis with acute organ dysfunction (HCC) Active Problems:   S/P CABG x 4   Cellulitis   Acute kidney failure ([place])    1. Sepsis, hypotension- 1. Cellulitis vs necrotizing fasciitis 1. EDP calling surgery for emergent evaluation to decide nec fasciitis vs cellulitis (OR management vs medical management) 2. Cont Zosyn, vanc and clinda until [place] fasc ruled out 3. IVF: 2L LR in ED and 125 cc/hr NS 4. Hold all home BP meds 5. NPO until we know for sure if he needs surgery 6. Dr. Marlou Sa evaluating at bedside in ED, wants MRI tib/fib w/o contrast of leg to help determine if this represents nec fasciitis or not.  Have  ordered this stat. 2. AKF - 1. Strict intake and output 2. Repeat BMP in AM 3. Hypokalemia - 1. Replace 40meq IV and 40 meq PO 2. Repeat BMP in AM 4. S/p CABG - 1. Cont ASA if not surgical 2. Cont statin 5. Hyperglycemia - not known to be diabetic at baseline 1. SSI Q4H sensitive scale for now  DVT prophylaxis: L SCD only until we know if he needs surgery or not Code Status: Full Family Communication: No family in room Disposition Plan: TBD Consults called: Gen surg Dr. Redmond Pulling who told EDP to call ortho surg Dr. Marlou Sa Admission status: Admit to inpatient  Severity of Illness: The appropriate patient status for this patient is INPATIENT. Inpatient status is judged to be reasonable and necessary in order to provide the required intensity of service to ensure the patient's safety. The patient's presenting symptoms, physical exam findings, and initial radiographic and laboratory data in the context of their chronic comorbidities is felt to place them at high risk for further clinical deterioration. Furthermore, it is not anticipated that the patient will be medically stable for discharge from the hospital within 2 midnights of admission. The following factors support the patient status of inpatient.   " The patient's presenting symptoms include Cellulitis, lightheadedness. " The worrisome physical exam findings include Hypotension, severe cellulitis with lymphangitis, rapidly progressive, possibly even nec fasciitis as above. " The initial radiographic and laboratory data are worrisome because of AKF with creat 4 up from 0.x a few days ago. " The chronic co-morbidities include CAD s/p CABG, HTN.   * I certify that at the point of admission it is my clinical judgment that the patient will require inpatient hospital care spanning beyond 2 midnights from the point of admission due to high intensity of service, high risk for further deterioration and high frequency of surveillance  required.*  Etta Quill DO [facility] Pager [phone] Only works nights!  If 7AM-7PM, please contact the primary day team physician taking care of patient  [email or web address] Password [facility]  03/30/2018, 10:59 PM

## 2018-03-31 ENCOUNTER — Inpatient Hospital Stay (HOSPITAL_COMMUNITY): Payer: Medicare Other

## 2018-03-31 DIAGNOSIS — E871 Hypo-osmolality and hyponatremia: Secondary | ICD-10-CM | POA: Diagnosis present

## 2018-03-31 DIAGNOSIS — M1990 Unspecified osteoarthritis, unspecified site: Secondary | ICD-10-CM | POA: Diagnosis present

## 2018-03-31 DIAGNOSIS — E860 Dehydration: Secondary | ICD-10-CM | POA: Diagnosis present

## 2018-03-31 DIAGNOSIS — I878 Other specified disorders of veins: Secondary | ICD-10-CM | POA: Diagnosis present

## 2018-03-31 DIAGNOSIS — N170 Acute kidney failure with tubular necrosis: Secondary | ICD-10-CM | POA: Diagnosis present

## 2018-03-31 DIAGNOSIS — F329 Major depressive disorder, single episode, unspecified: Secondary | ICD-10-CM | POA: Diagnosis present

## 2018-03-31 DIAGNOSIS — L03115 Cellulitis of right lower limb: Secondary | ICD-10-CM | POA: Diagnosis not present

## 2018-03-31 DIAGNOSIS — M7989 Other specified soft tissue disorders: Secondary | ICD-10-CM | POA: Diagnosis not present

## 2018-03-31 DIAGNOSIS — Z87891 Personal history of nicotine dependence: Secondary | ICD-10-CM | POA: Diagnosis not present

## 2018-03-31 DIAGNOSIS — G629 Polyneuropathy, unspecified: Secondary | ICD-10-CM | POA: Diagnosis present

## 2018-03-31 DIAGNOSIS — E876 Hypokalemia: Secondary | ICD-10-CM | POA: Diagnosis present

## 2018-03-31 DIAGNOSIS — M79661 Pain in right lower leg: Secondary | ICD-10-CM | POA: Diagnosis not present

## 2018-03-31 DIAGNOSIS — Z955 Presence of coronary angioplasty implant and graft: Secondary | ICD-10-CM | POA: Diagnosis not present

## 2018-03-31 DIAGNOSIS — I10 Essential (primary) hypertension: Secondary | ICD-10-CM | POA: Diagnosis present

## 2018-03-31 DIAGNOSIS — F419 Anxiety disorder, unspecified: Secondary | ICD-10-CM | POA: Diagnosis present

## 2018-03-31 DIAGNOSIS — R6521 Severe sepsis with septic shock: Secondary | ICD-10-CM | POA: Diagnosis present

## 2018-03-31 DIAGNOSIS — N179 Acute kidney failure, unspecified: Secondary | ICD-10-CM | POA: Diagnosis not present

## 2018-03-31 DIAGNOSIS — E785 Hyperlipidemia, unspecified: Secondary | ICD-10-CM | POA: Diagnosis present

## 2018-03-31 DIAGNOSIS — R739 Hyperglycemia, unspecified: Secondary | ICD-10-CM | POA: Diagnosis present

## 2018-03-31 DIAGNOSIS — Z951 Presence of aortocoronary bypass graft: Secondary | ICD-10-CM | POA: Diagnosis not present

## 2018-03-31 DIAGNOSIS — Z23 Encounter for immunization: Secondary | ICD-10-CM | POA: Diagnosis not present

## 2018-03-31 DIAGNOSIS — R6520 Severe sepsis without septic shock: Secondary | ICD-10-CM | POA: Diagnosis not present

## 2018-03-31 DIAGNOSIS — K7200 Acute and subacute hepatic failure without coma: Secondary | ICD-10-CM | POA: Diagnosis not present

## 2018-03-31 DIAGNOSIS — A419 Sepsis, unspecified organism: Secondary | ICD-10-CM | POA: Diagnosis not present

## 2018-03-31 DIAGNOSIS — K219 Gastro-esophageal reflux disease without esophagitis: Secondary | ICD-10-CM | POA: Diagnosis present

## 2018-03-31 DIAGNOSIS — I891 Lymphangitis: Secondary | ICD-10-CM | POA: Diagnosis present

## 2018-03-31 DIAGNOSIS — I252 Old myocardial infarction: Secondary | ICD-10-CM | POA: Diagnosis not present

## 2018-03-31 DIAGNOSIS — I2510 Atherosclerotic heart disease of native coronary artery without angina pectoris: Secondary | ICD-10-CM | POA: Diagnosis present

## 2018-03-31 LAB — BASIC METABOLIC PANEL
Anion gap: 11 (ref 5–15)
BUN: 33 mg/dL — ABNORMAL HIGH (ref 8–23)
CO2: 20 mmol/L — ABNORMAL LOW (ref 22–32)
Calcium: 7.5 mg/dL — ABNORMAL LOW (ref 8.9–10.3)
Chloride: 96 mmol/L — ABNORMAL LOW (ref 98–111)
Creatinine, Ser: 3.41 mg/dL — ABNORMAL HIGH (ref 0.61–1.24)
GFR calc Af Amer: 20 mL/min — ABNORMAL LOW (ref >60)
GFR, EST NON AFRICAN AMERICAN: 17 mL/min — AB (ref >60)
GLUCOSE: 92 mg/dL (ref 70–99)
Potassium: 3.7 mmol/L (ref 3.5–5.1)
Sodium: 127 mmol/L — ABNORMAL LOW (ref 135–145)

## 2018-03-31 LAB — GLUCOSE, CAPILLARY: Glucose-Capillary: 85 mg/dL (ref 70–99)

## 2018-03-31 LAB — CBG MONITORING, ED: Glucose-Capillary: 78 mg/dL (ref 70–99)

## 2018-03-31 LAB — CBC
HCT: 35.8 % — ABNORMAL LOW (ref 39.0–52.0)
Hemoglobin: 11.5 g/dL — ABNORMAL LOW (ref 13.0–17.0)
MCH: 30.2 pg (ref 26.0–34.0)
MCHC: 32.1 g/dL (ref 30.0–36.0)
MCV: 94 fL (ref 80.0–100.0)
Platelets: 124 10*3/uL — ABNORMAL LOW (ref 150–400)
RBC: 3.81 MIL/uL — ABNORMAL LOW (ref 4.22–5.81)
RDW: 14.3 % (ref 11.5–15.5)
WBC: 10.5 10*3/uL (ref 4.0–10.5)
nRBC: 0 % (ref 0.0–0.2)

## 2018-03-31 LAB — MRSA PCR SCREENING: MRSA by PCR: NEGATIVE

## 2018-03-31 LAB — HIV ANTIBODY (ROUTINE TESTING W REFLEX): HIV Screen 4th Generation wRfx: NONREACTIVE

## 2018-03-31 MED ORDER — ONDANSETRON HCL 4 MG/2ML IJ SOLN: 4.0000 mg | Freq: Four times a day (QID) | INTRAMUSCULAR | Status: DC | PRN

## 2018-03-31 MED ORDER — HEPARIN SODIUM (PORCINE) 5000 UNIT/ML IJ SOLN
5000.0000 [IU] | Freq: Three times a day (TID) | INTRAMUSCULAR | Status: DC
  Administered 2018-03-31 – 2018-04-05 (×15): 5000 [IU] via SUBCUTANEOUS
  Filled 2018-03-31 (×15): qty 1

## 2018-03-31 MED ORDER — METOPROLOL SUCCINATE ER 50 MG PO TB24
50.0000 mg | ORAL_TABLET | Freq: Every day | ORAL | Status: DC
  Administered 2018-04-01 – 2018-04-05 (×5): 50 mg via ORAL
  Filled 2018-03-31 (×5): qty 1

## 2018-03-31 MED ORDER — ONDANSETRON HCL 4 MG PO TABS: 4.0000 mg | ORAL_TABLET | Freq: Four times a day (QID) | ORAL | Status: DC | PRN

## 2018-03-31 MED ORDER — SODIUM CHLORIDE 0.9 % IV SOLN
INTRAVENOUS | Status: DC | PRN
  Administered 2018-03-31: 100 mL via INTRAVENOUS

## 2018-03-31 MED ORDER — ACETAMINOPHEN 160 MG/5ML PO SOLN: 650.0000 mg | Freq: Four times a day (QID) | ORAL | Status: DC | PRN

## 2018-03-31 MED ORDER — FENTANYL CITRATE (PF) 100 MCG/2ML IJ SOLN: 50.0000 ug | Freq: Once | INTRAMUSCULAR | Status: DC

## 2018-03-31 MED ORDER — SALINE SPRAY 0.65 % NA SOLN
1.0000 | Freq: Once | NASAL | Status: AC
  Administered 2018-03-31: 1 via NASAL
  Filled 2018-03-31: qty 44

## 2018-03-31 MED ORDER — PNEUMOCOCCAL VAC POLYVALENT 25 MCG/0.5ML IJ INJ
0.5000 mL | INJECTION | INTRAMUSCULAR | Status: AC
  Administered 2018-04-01: 0.5 mL via INTRAMUSCULAR
  Filled 2018-03-31: qty 0.5

## 2018-03-31 MED ORDER — ACETAMINOPHEN 325 MG PO TABS: 650.0000 mg | ORAL_TABLET | Freq: Four times a day (QID) | ORAL | Status: DC | PRN

## 2018-03-31 MED ORDER — INSULIN ASPART 100 UNIT/ML ~~LOC~~ SOLN: 0.0000 [IU] | SUBCUTANEOUS | Status: DC

## 2018-03-31 MED ORDER — ACETAMINOPHEN 650 MG RE SUPP: 650.0000 mg | Freq: Four times a day (QID) | RECTAL | Status: DC | PRN

## 2018-03-31 MED ORDER — HYDROCODONE-ACETAMINOPHEN 5-325 MG PO TABS
1.0000 | ORAL_TABLET | Freq: Four times a day (QID) | ORAL | Status: DC | PRN
  Administered 2018-03-31 – 2018-04-02 (×3): 1 via ORAL
  Filled 2018-03-31 (×3): qty 1

## 2018-03-31 MED ORDER — CLINDAMYCIN PHOSPHATE 900 MG/50ML IV SOLN
900.0000 mg | Freq: Three times a day (TID) | INTRAVENOUS | Status: DC
  Administered 2018-03-31 – 2018-04-02 (×7): 900 mg via INTRAVENOUS
  Filled 2018-03-31 (×8): qty 50

## 2018-03-31 NOTE — ED Notes (Signed)
Pt returned from MRI, no distress noted.

## 2018-03-31 NOTE — ED Provider Notes (Signed)
I have personally seen and examined the patient. I have reviewed the documentation on PMH/FH/Soc Hx. I have discussed the plan of care with the resident and patient.  I have reviewed and agree with the resident's documentation. Please see associated encounter note.  Briefly, the patient is a 68 y.o. male here with right lower leg swelling and pain.  Patient with history of coronary artery disease status post CABG, depression, hypertension, high cholesterol.  Patient with hypotension upon arrival to urgent care that has now resolved upon my evaluation.  Otherwise normal vitals.  Patient received IV fluids before my evaluation by EMS.  Patient otherwise with no tachycardia, no fever.  Does have redness and swelling to his right lower extremity.  There is streaking up into his right thigh.  There is no involvement in his perineum.  He does have palpable pulses bilaterally.  Concerning for cellulitis.  Possible necrotizing fasciitis.  Patient with elevated lactic acid to 3.27, multiple metabolic derangements including kidney injury with a creatinine of 4, potassium 2.6.  Given 2 L normal saline bolus with improvement of blood pressure. Got a Liter with EMS and started on maintence fluids. Lactic acid improved to 1.86. Perfusion stable.  Never had hypotension upon my care.  Patient empirically given IV antibiotics for sepsis due to leg infection. K repleted. Patient had a white count of 12.5.  Patient did have mild elevation in CRP, ESR.  Normal CK.  X-rays of the right lower extremity showed no acute findings.  No gaseous process.  Dr. Marlou Sa with orthopedics was consulted in case patient needs any surgical debridement.  Patient admitted to hospital service for further care.  Hemodynamically stable throughout my care.  Given IV antibiotics and IV fluids with improvement. MRI ordered per ortho request.  .Critical Care Performed by: Lennice Sites, DO Authorized by: Lennice Sites, DO   Critical care provider  statement:    Critical care time (minutes):  35   Critical care time was exclusive of:  Separately billable procedures and treating other patients and teaching time   Critical care was necessary to treat or prevent imminent or life-threatening deterioration of the following conditions:  Sepsis   Critical care was time spent personally by me on the following activities:  Development of treatment plan with patient or surrogate, discussions with primary provider, evaluation of patient's response to treatment, examination of patient, discussions with consultants, obtaining history from patient or surrogate, ordering and review of laboratory studies, ordering and review of radiographic studies, pulse oximetry, re-evaluation of patient's condition, ordering and performing treatments and interventions and review of old charts   I assumed direction of critical care for this patient from another provider in my specialty: no       This chart was dictated using voice recognition software.  Despite best efforts to proofread,  errors can occur which can change the documentation meaning.    EKG Interpretation None        Lennice Sites, DO 03/31/18 0031

## 2018-03-31 NOTE — Telephone Encounter (Signed)
Left detailed message informing pt of below. Katharina Caper, April D, [place]

## 2018-03-31 NOTE — ED Notes (Signed)
Report attempted 

## 2018-03-31 NOTE — ED Notes (Signed)
Patient transported to MRI 

## 2018-03-31 NOTE — Progress Notes (Signed)
[place] TEAM 1 - Stepdown/ICU TEAM  WILMONT OLUND  NWG:[id number] DOB: 1950/04/10 DOA: 03/30/2018 PCP: [name], DO    Brief Narrative:  68 y.o. male with a hx of CAD s/p CABG, HTN, and HLD who presented w/ a  3-4 day history of RLE erythema w/ associated lightheadedness. He first went to UC, where they found him to be hypotensive and sent him to the ED.   In the ED his initial SBP was in the 60s. Plain film X rays were neg for subq air. He was found to have a creat of 4, K 2.6, sodium 125, and lactate 3.2.  Significant Events: 1/20 admit   Subjective: The patient states he is feeling better overall.  He continues to have pain in his right leg but states that it has improved.  He denies chest pain nausea vomiting or abdominal pain.  Assessment & Plan:  Septic shock due to severe rapidly progressive R LE cellutlitis  No evidence of fasciitis on exam or MRI -clinically slowly improving the appearance of the leg is without significant change -margins have been marked with a pen and will be followed with no evidence today of extension beyond the markings made at the time of admission   Acute renal failure  Pre-renal azotemia v/s shock associated ATN - follow w/ volume expansion - appears to be slowly improving - good urine output charted -keep hydrated  Hypotension Resolved w/ volume resuscitation   Hyponatremia Due to volume depletion - improving w/ IVF - follow   Hypokalemia Carefully supplement and follow  Mild transaminitis Likely low-grade shock liver -hold Lipitor -follow-up with volume expansion  CAD s/p CABG 2015 Asymptomatic  Hyperglycemia  Check A1c - no convincing evidence of DM presently   DVT prophylaxis: Subcutaneous heparin Code Status: FULL CODE Family Communication: no family present at time of exam  Disposition Plan: Stable for telemetry bed  Consultants:  Ortho  Antimicrobials:  Zosyn Vanc   Objective: Blood pressure 113/67, pulse 80,  temperature 98.9 F (37.2 C), temperature source Oral, resp. rate 18, SpO2 99 %.  Intake/Output Summary (Last 24 hours) at 03/31/2018 1344 Last data filed at 03/31/2018 1131 Gross per 24 hour  Intake 2800 ml  Output 725 ml  Net 2075 ml   There were no vitals filed for this visit.  Examination: General: No acute respiratory distress Lungs: Clear to auscultation bilaterally without wheezes or crackles Cardiovascular: Regular rate and rhythm without murmur gallop or rub normal S1 and S2 Abdomen: Nontender, nondistended, soft, bowel sounds positive, no rebound, no ascites, no appreciable mass Extremities: Impressive erythema of right lower extremity with no induration or fluctuance -margins marked with a surgical marker without extension of erythema beyond markings  CBC: Recent Labs  Lab 03/30/18 2115 03/31/18 0324  WBC 12.5* 10.5  NEUTROABS 10.3*  --   HGB 12.9* 11.5*  HCT 39.0 35.8*  MCV 95.6 94.0  PLT 151 578*   Basic Metabolic Panel: Recent Labs  Lab 03/27/18 1110 03/30/18 2115 03/31/18 0324  NA 133* 125* 127*  K 4.2 2.6* 3.7  CL 91* 89* 96*  CO2 25 22 20*  GLUCOSE 117* 223* 92  BUN 6* 29* 33*  CREATININE 0.93 4.02* 3.41*  CALCIUM 9.3 7.7* 7.5*   GFR: Estimated Creatinine Clearance: 27.5 mL/min (A) (by C-G formula based on SCr of 3.41 mg/dL (H)).  Liver Function Tests: Recent Labs  Lab 03/30/18 2115  AST 114*  ALT 64*  ALKPHOS 112  BILITOT 1.9*  PROT 7.5  ALBUMIN 2.3*    Coagulation Profile: Recent Labs  Lab 03/30/18 2115  INR 1.24    Cardiac Enzymes: Recent Labs  Lab 03/30/18 2115  CKTOTAL 89    HbA1C: Hgb A1c MFr Bld  Date/Time Value Ref Range Status  11/23/2016 07:44 AM 5.2 4.8 - 5.6 % Final    Comment:    (NOTE) Pre diabetes:          5.7%-6.4% Diabetes:              >6.4% Glycemic control for   <7.0% adults with diabetes   10/28/2013 07:02 PM 5.4 <5.7 % Final    Comment:    (NOTE)                                                                        According to the ADA Clinical Practice Recommendations for 2011, when HbA1c is used as a screening test:  >=6.5%   Diagnostic of Diabetes Mellitus           (if abnormal result is confirmed) 5.7-6.4%   Increased risk of developing Diabetes Mellitus References:Diagnosis and Classification of Diabetes Mellitus,Diabetes WYOV,7858,85(OYDXA 1):S62-S69 and Standards of Medical Care in         Diabetes - 2011,Diabetes Care,2011,34 (Suppl 1):S11-S61.    CBG: Recent Labs  Lab 03/31/18 0411  GLUCAP 78    No results found for this or any previous visit (from the past 240 hour(s)).   Scheduled Meds: . atorvastatin  80 mg Oral QHS  . buPROPion  300 mg Oral Daily  . heparin  5,000 Units Subcutaneous Q8H  . insulin aspart  0-9 Units Subcutaneous Q4H  . nefazodone  200 mg Oral BID  . pantoprazole  40 mg Oral Daily  . vancomycin variable dose per unstable renal function (pharmacist dosing)   Does not apply See admin instructions   Continuous Infusions: . sodium chloride 125 mL/hr at 03/30/18 2303  . clindamycin (CLEOCIN) IV Stopped (03/31/18 0804)  . piperacillin-tazobactam (ZOSYN)  IV Stopped (03/31/18 1106)     LOS: 0 days   Cherene Altes, MD [facility] Office  [phone] Pager - Text Page per Amion  If 7PM-7AM, please contact night-coverage per Amion 03/31/2018, 1:44 PM

## 2018-03-31 NOTE — Progress Notes (Signed)
Discussed with Dr. Roxanne Mins last night at 3 AM the MRI findings. No definite drainable fluid collection in the right tib-fib region Plan at this time is medical management

## 2018-04-01 LAB — CBC
HCT: 33.7 % — ABNORMAL LOW (ref 39.0–52.0)
HEMOGLOBIN: 11.3 g/dL — AB (ref 13.0–17.0)
MCH: 31.4 pg (ref 26.0–34.0)
MCHC: 33.5 g/dL (ref 30.0–36.0)
MCV: 93.6 fL (ref 80.0–100.0)
Platelets: 121 10*3/uL — ABNORMAL LOW (ref 150–400)
RBC: 3.6 MIL/uL — ABNORMAL LOW (ref 4.22–5.81)
RDW: 14.6 % (ref 11.5–15.5)
WBC: 5.9 10*3/uL (ref 4.0–10.5)
nRBC: 0 % (ref 0.0–0.2)

## 2018-04-01 LAB — COMPREHENSIVE METABOLIC PANEL
ALBUMIN: 2 g/dL — AB (ref 3.5–5.0)
ALT: 62 U/L — ABNORMAL HIGH (ref 0–44)
AST: 120 U/L — ABNORMAL HIGH (ref 15–41)
Alkaline Phosphatase: 119 U/L (ref 38–126)
Anion gap: 8 (ref 5–15)
BUN: 26 mg/dL — ABNORMAL HIGH (ref 8–23)
CO2: 21 mmol/L — ABNORMAL LOW (ref 22–32)
Calcium: 7.4 mg/dL — ABNORMAL LOW (ref 8.9–10.3)
Chloride: 101 mmol/L (ref 98–111)
Creatinine, Ser: 2.03 mg/dL — ABNORMAL HIGH (ref 0.61–1.24)
GFR calc Af Amer: 38 mL/min — ABNORMAL LOW (ref >60)
GFR calc non Af Amer: 33 mL/min — ABNORMAL LOW (ref >60)
Glucose, Bld: 87 mg/dL (ref 70–99)
Potassium: 3.7 mmol/L (ref 3.5–5.1)
Sodium: 130 mmol/L — ABNORMAL LOW (ref 135–145)
Total Bilirubin: 1.5 mg/dL — ABNORMAL HIGH (ref 0.3–1.2)
Total Protein: 7.1 g/dL (ref 6.5–8.1)

## 2018-04-01 LAB — HEMOGLOBIN A1C
Hgb A1c MFr Bld: 5.2 % (ref 4.8–5.6)
Mean Plasma Glucose: 102.54 mg/dL

## 2018-04-01 MED ORDER — FLUTICASONE PROPIONATE 50 MCG/ACT NA SUSP
1.0000 | Freq: Every day | NASAL | Status: DC
  Administered 2018-04-01 – 2018-04-05 (×7): 1 via NASAL
  Filled 2018-04-01: qty 16

## 2018-04-01 MED ORDER — VANCOMYCIN HCL 10 G IV SOLR
1250.0000 mg | INTRAVENOUS | Status: DC
  Administered 2018-04-01 – 2018-04-02 (×2): 1250 mg via INTRAVENOUS
  Filled 2018-04-01 (×2): qty 1250

## 2018-04-01 MED ORDER — HYDRALAZINE HCL 20 MG/ML IJ SOLN
10.0000 mg | Freq: Once | INTRAMUSCULAR | Status: AC
  Administered 2018-04-01: 10 mg via INTRAVENOUS
  Filled 2018-04-01: qty 1

## 2018-04-01 NOTE — Progress Notes (Signed)
[place] TEAM 1 - Stepdown/ICU TEAM  Gregory Hansen  SNK:[id number] DOB: 1950/12/02 DOA: 03/30/2018 PCP: [name], DO    Brief Narrative:  69 y.o. male with a hx of CAD s/p CABG, HTN, and HLD who presented w/ a  3-4 day history of RLE erythema w/ associated lightheadedness. He first went to UC, where they found him to be hypotensive and sent him to the ED.   In the ED his initial SBP was in the 60s. Plain film X rays were neg for subq air. He was found to have a creat of 4, K 2.6, sodium 125, and lactate 3.2.  Significant Events: 1/20 admit   Subjective: He says he is feeling better. Still has some pain in his right leg but states that it has improved.  He denies chest pain nausea, vomiting or abdominal pain.  Assessment & Plan:  Septic shock due to severe rapidly progressive R LE cellutlitis  No evidence of fasciitis on exam or MRI  -clinically slowly improving erythema  -margins have been marked with a pen and will be followed with no evidence today of extension beyond the markings.  Acute renal failure  Pre-renal azotemia v/s shock associated ATN - follow w/ volume expansion - appears to be slowly improving - good urine output charted -keep hydrated -Continue to monitor BUN/creatinine.  Hypotension Resolved w/ volume resuscitation  -Continue to monitor blood pressure closely and adjust medications as needed.  Hyponatremia Due to volume depletion - improving w/ IVF - follow   Hypokalemia Replaced.  Continue to follow and replace as needed.  Mild transaminitis Likely low-grade shock liver -hold Lipitor -follow-up with volume expansion  CAD s/p CABG 2015 Asymptomatic  Hyperglycemia  Hb A1c 5.2. No convincing evidence of DM presently   DVT prophylaxis: Subcutaneous heparin Code Status: FULL CODE Family Communication: no family present at time of exam  Disposition Plan: Home when stable  Consultants:  Ortho  Antimicrobials:  Zosyn Vanc    Objective: Blood pressure (!) 154/86, pulse 78, temperature 97.7 F (36.5 C), temperature source Oral, resp. rate 20, height 6\' 2"  (1.88 m), weight 111.8 kg, SpO2 95 %.  Intake/Output Summary (Last 24 hours) at 04/01/2018 1136 Last data filed at 04/01/2018 0930 Gross per 24 hour  Intake 3783.67 ml  Output 2200 ml  Net 1583.67 ml   Filed Weights   03/31/18 2316  Weight: 111.8 kg    Examination: General: No acute respiratory distress Lungs: Clear to auscultation bilaterally without wheezes or crackles Cardiovascular: Regular rate and rhythm without murmur gallop or rub normal S1 and S2 Abdomen: Nontender, nondistended, soft, bowel sounds positive, no rebound, no ascites, no appreciable mass Extremities: Impressive erythema of right lower extremity with no induration or fluctuance -margins marked with a surgical marker without extension of erythema beyond markings  CBC: Recent Labs  Lab 03/30/18 2115 03/31/18 0324 04/01/18 0332  WBC 12.5* 10.5 5.9  NEUTROABS 10.3*  --   --   HGB 12.9* 11.5* 11.3*  HCT 39.0 35.8* 33.7*  MCV 95.6 94.0 93.6  PLT 151 124* 937*   Basic Metabolic Panel: Recent Labs  Lab 03/30/18 2115 03/31/18 0324 04/01/18 0332  NA 125* 127* 130*  K 2.6* 3.7 3.7  CL 89* 96* 101  CO2 22 20* 21*  GLUCOSE 223* 92 87  BUN 29* 33* 26*  CREATININE 4.02* 3.41* 2.03*  CALCIUM 7.7* 7.5* 7.4*   GFR: Estimated Creatinine Clearance: 46.3 mL/min (A) (by C-G formula based on SCr of 2.03  mg/dL (H)).  Liver Function Tests: Recent Labs  Lab 03/30/18 2115 04/01/18 0332  AST 114* 120*  ALT 64* 62*  ALKPHOS 112 119  BILITOT 1.9* 1.5*  PROT 7.5 7.1  ALBUMIN 2.3* 2.0*    Coagulation Profile: Recent Labs  Lab 03/30/18 2115  INR 1.24    Cardiac Enzymes: Recent Labs  Lab 03/30/18 2115  CKTOTAL 89    HbA1C: Hgb A1c MFr Bld  Date/Time Value Ref Range Status  04/01/2018 03:32 AM 5.2 4.8 - 5.6 % Final    Comment:    (NOTE) Pre diabetes:           5.7%-6.4% Diabetes:              >6.4% Glycemic control for   <7.0% adults with diabetes   11/23/2016 07:44 AM 5.2 4.8 - 5.6 % Final    Comment:    (NOTE) Pre diabetes:          5.7%-6.4% Diabetes:              >6.4% Glycemic control for   <7.0% adults with diabetes     CBG: Recent Labs  Lab 03/31/18 0411 03/31/18 1426  GLUCAP 78 85    Recent Results (from the past 240 hour(s))  Culture, blood (Routine x 2)     Status: None (Preliminary result)   Collection Time: 03/30/18  8:45 PM  Result Value Ref Range Status   Specimen Description BLOOD RIGHT HAND  Final   Special Requests   Final    BOTTLES DRAWN AEROBIC AND ANAEROBIC Blood Culture adequate volume   Culture   Final    NO GROWTH 2 DAYS Performed at [facility], [address] [place], [place] [address]    Report Status PENDING  Incomplete  Culture, blood (Routine x 2)     Status: None (Preliminary result)   Collection Time: 03/30/18  9:00 PM  Result Value Ref Range Status   Specimen Description BLOOD LEFT ANTECUBITAL  Final   Special Requests   Final    BOTTLES DRAWN AEROBIC AND ANAEROBIC Blood Culture adequate volume   Culture   Final    NO GROWTH 2 DAYS Performed at [facility], [place] [address], [place], [place] [address]    Report Status PENDING  Incomplete  MRSA PCR Screening     Status: None   Collection Time: 03/31/18  6:00 PM  Result Value Ref Range Status   MRSA by PCR NEGATIVE NEGATIVE Final    Comment:        The GeneXpert MRSA Assay (FDA approved for NASAL specimens only), is one component of a comprehensive MRSA colonization surveillance program. It is not intended to diagnose MRSA infection nor to guide or monitor treatment for MRSA infections. Performed at [facility], [place] [address] [place], [place] [address]      Scheduled Meds: . buPROPion  300 mg Oral Daily  . fluticasone  1 spray Each Nare Daily  . heparin  5,000 Units Subcutaneous Q8H  . metoprolol  succinate  50 mg Oral Daily  . nefazodone  200 mg Oral BID  . pantoprazole  40 mg Oral Daily  . pneumococcal 23 valent vaccine  0.5 mL Intramuscular Tomorrow-1000   Continuous Infusions: . sodium chloride Stopped (04/01/18 1884)  . sodium chloride Stopped (04/01/18 1660)  . clindamycin (CLEOCIN) IV 900 mg (04/01/18 6301)  . piperacillin-tazobactam (ZOSYN)  IV 3.375 g (04/01/18 0628)  . vancomycin  LOS: 1 day   Yaakov Guthrie, MD [facility] Office  [phone] Pager - Text Page per Amion  If 7PM-7AM, please contact night-coverage per Amion 04/01/2018, 11:36 AM

## 2018-04-01 NOTE — Progress Notes (Signed)
Pharmacy Antibiotic Note  Gregory Hansen is a 68 y.o. male admitted on 03/30/2018 with cellulitis.  Pharmacy has been consulted for Vancomycin and Zosyn dosing.  Pt with AKI - Scr improved from 4.02  to 2.03, was 0.93 only 4 days ago.  Plan: Increase Vancomycin to 1250 mg IV q24h Zosyn 3.375gm IV q8h-each dose over 4 hours Will f/u renal function, micro data, and pt's clinical condition Height: 6\' 2"  (188 cm) Weight: 246 lb 7.6 oz (111.8 kg) IBW/kg (Calculated) : 82.2  Temp (24hrs), Avg:98.4 F (36.9 C), Min:97.7 F (36.5 C), Max:98.9 F (37.2 C)  Recent Labs  Lab 03/27/18 1110 03/30/18 2115 03/30/18 2117 03/30/18 2348 03/31/18 0324 04/01/18 0332  WBC  --  12.5*  --   --  10.5 5.9  CREATININE 0.93 4.02*  --   --  3.41* 2.03*  LATICACIDVEN  --   --  3.27* 1.86  --   --     Estimated Creatinine Clearance: 46.3 mL/min (A) (by C-G formula based on SCr of 2.03 mg/dL (H)).    No Known Allergies  Antimicrobials this admission: 1/20 Vanc >>  1/20 Zosyn >>  1/20 Clindamycin>>  Microbiology results: 1/20 BCx: NGTD  Gregory Hansen A. Levada Dy, PharmD, [place] Pager: [phone] Please utilize Amion for appropriate phone number to reach the unit pharmacist ([place])   04/01/2018 9:12 AM

## 2018-04-02 DIAGNOSIS — R6520 Severe sepsis without septic shock: Secondary | ICD-10-CM

## 2018-04-02 DIAGNOSIS — A419 Sepsis, unspecified organism: Principal | ICD-10-CM

## 2018-04-02 LAB — COMPREHENSIVE METABOLIC PANEL
ALK PHOS: 123 U/L (ref 38–126)
ALT: 59 U/L — ABNORMAL HIGH (ref 0–44)
AST: 101 U/L — ABNORMAL HIGH (ref 15–41)
Albumin: 2 g/dL — ABNORMAL LOW (ref 3.5–5.0)
Anion gap: 7 (ref 5–15)
BILIRUBIN TOTAL: 1.5 mg/dL — AB (ref 0.3–1.2)
BUN: 16 mg/dL (ref 8–23)
CO2: 21 mmol/L — ABNORMAL LOW (ref 22–32)
Calcium: 7.3 mg/dL — ABNORMAL LOW (ref 8.9–10.3)
Chloride: 102 mmol/L (ref 98–111)
Creatinine, Ser: 1.35 mg/dL — ABNORMAL HIGH (ref 0.61–1.24)
GFR calc Af Amer: >60 mL/min (ref >60)
GFR calc non Af Amer: 54 mL/min — ABNORMAL LOW (ref >60)
Glucose, Bld: 90 mg/dL (ref 70–99)
Potassium: 3.2 mmol/L — ABNORMAL LOW (ref 3.5–5.1)
Sodium: 130 mmol/L — ABNORMAL LOW (ref 135–145)
Total Protein: 7.7 g/dL (ref 6.5–8.1)

## 2018-04-02 LAB — CBC
HCT: 35.8 % — ABNORMAL LOW (ref 39.0–52.0)
Hemoglobin: 11.7 g/dL — ABNORMAL LOW (ref 13.0–17.0)
MCH: 30.7 pg (ref 26.0–34.0)
MCHC: 32.7 g/dL (ref 30.0–36.0)
MCV: 94 fL (ref 80.0–100.0)
Platelets: 141 10*3/uL — ABNORMAL LOW (ref 150–400)
RBC: 3.81 MIL/uL — ABNORMAL LOW (ref 4.22–5.81)
RDW: 14.7 % (ref 11.5–15.5)
WBC: 4.6 10*3/uL (ref 4.0–10.5)
nRBC: 0 % (ref 0.0–0.2)

## 2018-04-02 MED ORDER — VANCOMYCIN HCL IN DEXTROSE 750-5 MG/150ML-% IV SOLN
750.0000 mg | Freq: Two times a day (BID) | INTRAVENOUS | Status: DC
  Administered 2018-04-02 – 2018-04-04 (×4): 750 mg via INTRAVENOUS
  Filled 2018-04-02 (×4): qty 150

## 2018-04-02 MED ORDER — HYDRALAZINE HCL 20 MG/ML IJ SOLN
5.0000 mg | Freq: Once | INTRAMUSCULAR | Status: AC
  Administered 2018-04-02: 5 mg via INTRAVENOUS
  Filled 2018-04-02: qty 1

## 2018-04-02 MED ORDER — FUROSEMIDE 20 MG PO TABS
20.0000 mg | ORAL_TABLET | Freq: Once | ORAL | Status: AC
  Administered 2018-04-02: 20 mg via ORAL
  Filled 2018-04-02: qty 1

## 2018-04-02 MED ORDER — NITROGLYCERIN 0.4 MG SL SUBL: 0.4000 mg | SUBLINGUAL_TABLET | SUBLINGUAL | Status: DC | PRN

## 2018-04-02 MED ORDER — HYDROCODONE-ACETAMINOPHEN 5-325 MG PO TABS
1.0000 | ORAL_TABLET | Freq: Four times a day (QID) | ORAL | Status: DC | PRN
  Administered 2018-04-02 – 2018-04-05 (×6): 1 via ORAL
  Filled 2018-04-02 (×7): qty 1

## 2018-04-02 MED ORDER — POTASSIUM CHLORIDE CRYS ER 20 MEQ PO TBCR
40.0000 meq | EXTENDED_RELEASE_TABLET | Freq: Once | ORAL | Status: AC
  Administered 2018-04-02: 40 meq via ORAL
  Filled 2018-04-02: qty 2

## 2018-04-02 MED ORDER — SODIUM CHLORIDE 0.9 % IV SOLN
INTRAVENOUS | Status: DC
  Administered 2018-04-02: 11:00:00 via INTRAVENOUS

## 2018-04-02 MED ORDER — AMLODIPINE BESYLATE 10 MG PO TABS
10.0000 mg | ORAL_TABLET | Freq: Every day | ORAL | Status: DC
  Administered 2018-04-02 – 2018-04-05 (×4): 10 mg via ORAL
  Filled 2018-04-02 (×4): qty 1

## 2018-04-02 MED ORDER — VITAMIN B-12 1000 MCG PO TABS
1000.0000 ug | ORAL_TABLET | Freq: Every day | ORAL | Status: DC
  Administered 2018-04-02 – 2018-04-05 (×4): 1000 ug via ORAL
  Filled 2018-04-02 (×4): qty 1

## 2018-04-02 NOTE — Progress Notes (Signed)
[place] TEAM 1 - Stepdown/ICU TEAM  Gregory Hansen  QAS:[id number] DOB: [birth year] DOA: 03/30/2018 PCP: [name], DO    Brief Narrative:  68 y.o. male with a hx of CAD s/p CABG, HTN, and HLD who presented w/ a  3-4 day history of RLE erythema w/ associated lightheadedness. He first went to UC, where they found him to be hypotensive and sent him to the ED.   In the ED his initial SBP was in the 60s. Plain film X rays were neg for subq air. He was found to have a creat of 4, K 2.6, sodium 125, and lactate 3.2.  Significant Events: 1/20 admit   Subjective:  Patient in bed, appears comfortable, denies any headache, no fever, no chest pain or pressure, no shortness of breath , no abdominal pain. No focal weakness.   Assessment & Plan:  Septic shock due to severe right lower extremity cellulitis suspicious for Necrotizing fasciitis.  MRI lower extremity ruled out necrotizing fasciitis, seen by orthopedics.  Placed on broad-spectrum IV antibiotics which include IV vancomycin-Zosyn and clindamycin.  Will discontinue clindamycin on 04/02/2018.  Per patient his infection looks better but overall still quite angry looking right lower extremity.  Continue to follow cultures which are so far negative.  Encouraged to sit up in chair, if continues to improve likely discharge on oral antibiotics next 2 to 3 days.  Sepsis pathophysiology almost completely resolved.   ARF - likely due to ATN from sepsis almost completely resolved.  Hydrate for another 24 hours.  Also had some element of dehydration with hypotension and hyponatremia all resolved after hydration.    Hypokalemia  - replaced.  Will monitor.   Mild Transaminitis -  shock liver due to sepsis and hypotension, improved.  CAD s/p CABG 2015 - Asymptomatic, continue beta-blocker and statin for secondary prevention.      DVT prophylaxis: Subcutaneous heparin Code Status: FULL CODE Family Communication: no family present at time of  exam  Disposition Plan: Home when stable  Consultants:  Ortho  Antimicrobials:  Zosyn Vanc  Clind  Objective: Blood pressure (!) 160/88, pulse 70, temperature 98.1 F (36.7 C), temperature source Oral, resp. rate (!) 24, height 6\' 2"  (1.88 m), weight 111.8 kg, SpO2 96 %.  Intake/Output Summary (Last 24 hours) at 04/02/2018 1037 Last data filed at 04/02/2018 0524 Gross per 24 hour  Intake 1971.59 ml  Output 975 ml  Net 996.59 ml   Filed Weights   03/31/18 2316  Weight: 111.8 kg    Examination:  Awake Alert, Oriented X 3, No new F.N deficits, Normal affect [place].AT,PERRAL Supple Neck,No JVD, No cervical lymphadenopathy appriciated.  Symmetrical Chest wall movement, Good air movement bilaterally, CTAB RRR,No Gallops, Rubs or new Murmurs, No Parasternal Heave +ve B.Sounds, Abd Soft, No tenderness, No organomegaly appriciated, No rebound - guarding or rigidity. No Cyanosis, extensive right lower extremity cellulitis angry looking skin up to right knee, some streaking noted going up the thigh, per patient streaking much improved.  Redness improved, foot appears to be involved the most   CBC: Recent Labs  Lab 03/30/18 2115 03/31/18 0324 04/01/18 0332 04/02/18 0430  WBC 12.5* 10.5 5.9 4.6  NEUTROABS 10.3*  --   --   --   HGB 12.9* 11.5* 11.3* 11.7*  HCT 39.0 35.8* 33.7* 35.8*  MCV 95.6 94.0 93.6 94.0  PLT 151 124* 121* 798*   Basic Metabolic Panel: Recent Labs  Lab 03/31/18 0324 04/01/18 0332 04/02/18 0430  NA 127*  130* 130*  K 3.7 3.7 3.2*  CL 96* 101 102  CO2 20* 21* 21*  GLUCOSE 92 87 90  BUN 33* 26* 16  CREATININE 3.41* 2.03* 1.35*  CALCIUM 7.5* 7.4* 7.3*   GFR: Estimated Creatinine Clearance: 69.6 mL/min (A) (by C-G formula based on SCr of 1.35 mg/dL (H)).  Liver Function Tests: Recent Labs  Lab 03/30/18 2115 04/01/18 0332 04/02/18 0430  AST 114* 120* 101*  ALT 64* 62* 59*  ALKPHOS 112 119 123  BILITOT 1.9* 1.5* 1.5*  PROT 7.5 7.1 7.7  ALBUMIN  2.3* 2.0* 2.0*    Coagulation Profile: Recent Labs  Lab 03/30/18 2115  INR 1.24    Cardiac Enzymes: Recent Labs  Lab 03/30/18 2115  CKTOTAL 89    HbA1C: Hgb A1c MFr Bld  Date/Time Value Ref Range Status  04/01/2018 03:32 AM 5.2 4.8 - 5.6 % Final    Comment:    (NOTE) Pre diabetes:          5.7%-6.4% Diabetes:              >6.4% Glycemic control for   <7.0% adults with diabetes   11/23/2016 07:44 AM 5.2 4.8 - 5.6 % Final    Comment:    (NOTE) Pre diabetes:          5.7%-6.4% Diabetes:              >6.4% Glycemic control for   <7.0% adults with diabetes     CBG: Recent Labs  Lab 03/31/18 0411 03/31/18 1426  GLUCAP 78 85    Recent Results (from the past 240 hour(s))  Culture, blood (Routine x 2)     Status: None (Preliminary result)   Collection Time: 03/30/18  8:45 PM  Result Value Ref Range Status   Specimen Description BLOOD RIGHT HAND  Final   Special Requests   Final    BOTTLES DRAWN AEROBIC AND ANAEROBIC Blood Culture adequate volume   Culture   Final    NO GROWTH 2 DAYS Performed at [facility], [address], [place], [place] [address]    Report Status PENDING  Incomplete  Culture, blood (Routine x 2)     Status: None (Preliminary result)   Collection Time: 03/30/18  9:00 PM  Result Value Ref Range Status   Specimen Description BLOOD LEFT ANTECUBITAL  Final   Special Requests   Final    BOTTLES DRAWN AEROBIC AND ANAEROBIC Blood Culture adequate volume   Culture   Final    NO GROWTH 2 DAYS Performed at [facility], [place] [address], [place], [place] [address]    Report Status PENDING  Incomplete  MRSA PCR Screening     Status: None   Collection Time: 03/31/18  6:00 PM  Result Value Ref Range Status   MRSA by PCR NEGATIVE NEGATIVE Final    Comment:        The GeneXpert MRSA Assay (FDA approved for NASAL specimens only), is one component of a comprehensive MRSA colonization surveillance program. It is not intended to  diagnose MRSA infection nor to guide or monitor treatment for MRSA infections. Performed at [facility], [place] [address] [place], [place] [address]      Scheduled Meds: . buPROPion  300 mg Oral Daily  . fluticasone  1 spray Each Nare Daily  . heparin  5,000 Units Subcutaneous Q8H  . metoprolol succinate  50 mg Oral Daily  . nefazodone  200 mg Oral  BID  . pantoprazole  40 mg Oral Daily   Continuous Infusions: . sodium chloride    . clindamycin (CLEOCIN) IV 900 mg (04/02/18 0520)  . piperacillin-tazobactam (ZOSYN)  IV 3.375 g (04/02/18 0521)  . vancomycin 1,250 mg (04/02/18 0830)     LOS: 2 days   Signature  Lala Lund M.D on 04/02/2018 at 10:37 AM   -  To page go to [email or web address]

## 2018-04-02 NOTE — Consult Note (Signed)
[facility] CM Primary Care Navigator  04/02/2018  Gregory Hansen 11-Mar-1951 [id number]   Met with patient at the bedsideto identify possible discharge needs.  Patient reports having right leg/ thigh redness, swelling, pain with difficulty to walk that had led to this admission. (septic shock due to severe right lower extremity cellulitis suspicious for necrotizing fasciitis, mild transaminitis)  Patient Gregory Peace, DO with [place] [facility] [place].   [facility] and [place] [facility] [address] to obtain medications without difficulty so far.  Patienthasbeen managinghis ownmedications athome with use of "pill box" system filled once a week.   Patienthas been driving prior to admission but son [name]) and daughter [name]) will beable to provide transportation to his doctors' appointments if needed after discharge.  Patientliveswith son at home. He states that his son and daughter will be his primary caregivers if needed when he goes home.  Anticipated discharge planishome when ready per patient.   Patient expressedunderstandingto callhisprimary care provider's officeoncehereturnshomefor a post discharge follow-up within1- 2 weeksor sooner if needs arise.Patient letter (with PCP's contact number) was providedasareminder.   Explained to patientabout [facility] CM services available for health managementand resources,buthe denies having pressing needsor concerns for now except for starting to monitor and keepinga record of blood pressure readings to bring to provider onhisfollow-up visit (plans to obtain blood pressure cuff using [facility] catalog).  Patientexpressed understanding to seekreferral to [facility] care management from primary care provider if deemed necessary and appropriateforanyservicesinthefuture.  [facility] care management  information provided for future needs that he may have.  Primary care provider's office is listed as providing transition of care (TOC) follow-up.  Patient however,verbally agreedand optedforEMMIcalls to follow-up with hisrecovery at home.   Referral made for [facility] General calls after discharge.    For additional questions please contact:  Edwena Felty A. Giselle Brutus, BSN, RN-BC [facility] PRIMARY CARE Navigator Cell: [phone]

## 2018-04-03 LAB — BASIC METABOLIC PANEL
Anion gap: 8 (ref 5–15)
BUN: 11 mg/dL (ref 8–23)
CO2: 20 mmol/L — ABNORMAL LOW (ref 22–32)
Calcium: 7.8 mg/dL — ABNORMAL LOW (ref 8.9–10.3)
Chloride: 102 mmol/L (ref 98–111)
Creatinine, Ser: 1.16 mg/dL (ref 0.61–1.24)
GFR calc non Af Amer: >60 mL/min (ref >60)
Glucose, Bld: 85 mg/dL (ref 70–99)
Potassium: 3.4 mmol/L — ABNORMAL LOW (ref 3.5–5.1)
Sodium: 130 mmol/L — ABNORMAL LOW (ref 135–145)

## 2018-04-03 LAB — MAGNESIUM: Magnesium: 1.2 mg/dL — ABNORMAL LOW (ref 1.7–2.4)

## 2018-04-03 MED ORDER — POTASSIUM CHLORIDE CRYS ER 20 MEQ PO TBCR
40.0000 meq | EXTENDED_RELEASE_TABLET | Freq: Once | ORAL | Status: AC
  Administered 2018-04-03: 40 meq via ORAL
  Filled 2018-04-03: qty 2

## 2018-04-03 MED ORDER — MAGNESIUM SULFATE 2 GM/50ML IV SOLN
2.0000 g | Freq: Once | INTRAVENOUS | Status: AC
  Administered 2018-04-03: 2 g via INTRAVENOUS
  Filled 2018-04-03: qty 50

## 2018-04-03 MED ORDER — POTASSIUM CHLORIDE CRYS ER 20 MEQ PO TBCR: 40.0000 meq | EXTENDED_RELEASE_TABLET | Freq: Once | ORAL | Status: DC

## 2018-04-03 MED ORDER — SODIUM CHLORIDE 0.9 % IV SOLN
INTRAVENOUS | Status: DC | PRN
  Administered 2018-04-03: 100 mL via INTRAVENOUS

## 2018-04-03 NOTE — Progress Notes (Signed)
[place] TEAM 1 - Stepdown/ICU TEAM  Gregory Hansen  UTM:[id number] DOB: 24-Jul-1950 DOA: 03/30/2018 PCP: [name], DO    Brief Narrative:  68 y.o. male with a hx of CAD s/p CABG, HTN, and HLD who presented w/ a  3-4 day history of RLE erythema w/ associated lightheadedness. He first went to UC, where they found him to be hypotensive and sent him to the ED.   In the ED his initial SBP was in the 60s. Plain film X rays were neg for subq air. He was found to have a creat of 4, K 2.6, sodium 125, and lactate 3.2.  Significant Events: 1/20 admit   Subjective:  Patient in bed, appears comfortable, denies any headache, no fever, no chest pain or pressure, no shortness of breath , no abdominal pain. No focal weakness.  Assessment & Plan:  Septic shock due to severe right lower extremity cellulitis suspicious for Necrotizing fasciitis.  MRI lower extremity ruled out necrotizing fasciitis, seen by orthopedics.  Despite IV antibiotics he still has quite angry looking right lower extremity although in comparison there is gradual improvement, will continue IV vancomycin and Zosyn.  Clindamycin was discontinued on 04/02/2018 status no further suspicion for necrotizing fasciitis.  Sepsis pathophysiology has resolved.  Encouraged him to advance activity, keep his right leg elevated under 3 pillows whenever he is in bed or resting.  Continue to monitor.  ARF -due to ATN from sepsis and shock, resolved after hydration.    Hypokalemia & Hypomagnesium  - replaced will monitor.   Mild Transaminitis -  shock liver due to sepsis and hypotension, improved.  Monitor trend.  CAD s/p CABG 2015 - Asymptomatic, continue beta-blocker and statin for secondary prevention.    DVT prophylaxis: Subcutaneous heparin Code Status: FULL CODE Family Communication: no family present at time of exam  Disposition Plan: Home when stable  Consultants:  Ortho  Anti-infectives (From admission, onward)   Start      Dose/Rate Route Frequency Ordered Stop   04/02/18 2100  vancomycin (VANCOCIN) IVPB 750 mg/150 ml premix     750 mg 150 mL/hr over 60 Minutes Intravenous Every 12 hours 04/02/18 1310     04/01/18 0930  vancomycin (VANCOCIN) 1,250 mg in sodium chloride 0.9 % 250 mL IVPB  Status:  Discontinued     1,250 mg 166.7 mL/hr over 90 Minutes Intravenous Every 24 hours 04/01/18 0915 04/02/18 1309   03/31/18 0600  piperacillin-tazobactam (ZOSYN) IVPB 3.375 g     3.375 g 12.5 mL/hr over 240 Minutes Intravenous Every 8 hours 03/30/18 2223     03/31/18 0600  clindamycin (CLEOCIN) IVPB 900 mg  Status:  Discontinued     900 mg 100 mL/hr over 30 Minutes Intravenous Every 8 hours 03/31/18 0032 04/02/18 1039   03/30/18 2222  vancomycin variable dose per unstable renal function (pharmacist dosing)  Status:  Discontinued      Does not apply See admin instructions 03/30/18 2223 04/01/18 0915   03/30/18 2215  vancomycin (VANCOCIN) 2,000 mg in sodium chloride 0.9 % 500 mL IVPB     2,000 mg 250 mL/hr over 120 Minutes Intravenous  Once 03/30/18 2153 03/31/18 0102   03/30/18 2200  piperacillin-tazobactam (ZOSYN) IVPB 3.375 g     3.375 g 100 mL/hr over 30 Minutes Intravenous  Once 03/30/18 2153 03/30/18 2247   03/30/18 2200  clindamycin (CLEOCIN) IVPB 900 mg     900 mg 100 mL/hr over 30 Minutes Intravenous  Once 03/30/18 2153 03/30/18  2247      Objective: Blood pressure 133/64, pulse 73, temperature 98.6 F (37 C), temperature source Oral, resp. rate (!) 24, height 6\' 2"  (1.88 m), weight 111.8 kg, SpO2 97 %.  Intake/Output Summary (Last 24 hours) at 04/03/2018 0933 Last data filed at 04/03/2018 0900 Gross per 24 hour  Intake 3208.95 ml  Output 825 ml  Net 2383.95 ml   Filed Weights   03/31/18 2316  Weight: 111.8 kg    Examination:  Awake Alert, Oriented X 3, No new F.N deficits, Normal affect [place].AT,PERRAL Supple Neck,No JVD, No cervical lymphadenopathy appriciated.  Symmetrical Chest wall movement,  Good air movement bilaterally, CTAB RRR,No Gallops, Rubs or new Murmurs, No Parasternal Heave +ve B.Sounds, Abd Soft, No tenderness, No organomegaly appriciated, No rebound - guarding or rigidity. No Cyanosis, Clubbing or edema, extensive right lower extremity cellulitis angry looking skin up to right knee, some streaking noted going up the thigh, per patient streaking much improved.  Redness improved, foot appears to be involved the most   CBC: Recent Labs  Lab 03/30/18 2115 03/31/18 0324 04/01/18 0332 04/02/18 0430  WBC 12.5* 10.5 5.9 4.6  NEUTROABS 10.3*  --   --   --   HGB 12.9* 11.5* 11.3* 11.7*  HCT 39.0 35.8* 33.7* 35.8*  MCV 95.6 94.0 93.6 94.0  PLT 151 124* 121* 675*   Basic Metabolic Panel: Recent Labs  Lab 04/01/18 0332 04/02/18 0430 04/03/18 0636  NA 130* 130* 130*  K 3.7 3.2* 3.4*  CL 101 102 102  CO2 21* 21* 20*  GLUCOSE 87 90 85  BUN 26* 16 11  CREATININE 2.03* 1.35* 1.16  CALCIUM 7.4* 7.3* 7.8*  MG  --   --  1.2*   GFR: Estimated Creatinine Clearance: 81 mL/min (by C-G formula based on SCr of 1.16 mg/dL).  Liver Function Tests: Recent Labs  Lab 03/30/18 2115 04/01/18 0332 04/02/18 0430  AST 114* 120* 101*  ALT 64* 62* 59*  ALKPHOS 112 119 123  BILITOT 1.9* 1.5* 1.5*  PROT 7.5 7.1 7.7  ALBUMIN 2.3* 2.0* 2.0*    Coagulation Profile: Recent Labs  Lab 03/30/18 2115  INR 1.24    Cardiac Enzymes: Recent Labs  Lab 03/30/18 2115  CKTOTAL 89    HbA1C: Hgb A1c MFr Bld  Date/Time Value Ref Range Status  04/01/2018 03:32 AM 5.2 4.8 - 5.6 % Final    Comment:    (NOTE) Pre diabetes:          5.7%-6.4% Diabetes:              >6.4% Glycemic control for   <7.0% adults with diabetes   11/23/2016 07:44 AM 5.2 4.8 - 5.6 % Final    Comment:    (NOTE) Pre diabetes:          5.7%-6.4% Diabetes:              >6.4% Glycemic control for   <7.0% adults with diabetes     CBG: Recent Labs  Lab 03/31/18 0411 03/31/18 1426  GLUCAP 78 85     Recent Results (from the past 240 hour(s))  Culture, blood (Routine x 2)     Status: None (Preliminary result)   Collection Time: 03/30/18  8:45 PM  Result Value Ref Range Status   Specimen Description BLOOD RIGHT HAND  Final   Special Requests   Final    BOTTLES DRAWN AEROBIC AND ANAEROBIC Blood Culture adequate volume   Culture   Final  NO GROWTH 3 DAYS Performed at [facility], [place] [address] [place], [place] [address]    Report Status PENDING  Incomplete  Culture, blood (Routine x 2)     Status: None (Preliminary result)   Collection Time: 03/30/18  9:00 PM  Result Value Ref Range Status   Specimen Description BLOOD LEFT ANTECUBITAL  Final   Special Requests   Final    BOTTLES DRAWN AEROBIC AND ANAEROBIC Blood Culture adequate volume   Culture   Final    NO GROWTH 3 DAYS Performed at [facility], [place] [address] [place], [place] [address]    Report Status PENDING  Incomplete  MRSA PCR Screening     Status: None   Collection Time: 03/31/18  6:00 PM  Result Value Ref Range Status   MRSA by PCR NEGATIVE NEGATIVE Final    Comment:        The GeneXpert MRSA Assay (FDA approved for NASAL specimens only), is one component of a comprehensive MRSA colonization surveillance program. It is not intended to diagnose MRSA infection nor to guide or monitor treatment for MRSA infections. Performed at [facility], [place] [address] [place], [place] [address]      Scheduled Meds: . amLODipine  10 mg Oral Daily  . buPROPion  300 mg Oral Daily  . fluticasone  1 spray Each Nare Daily  . heparin  5,000 Units Subcutaneous Q8H  . metoprolol succinate  50 mg Oral Daily  . nefazodone  200 mg Oral BID  . pantoprazole  40 mg Oral Daily  . vitamin B-12  1,000 mcg Oral Daily   Continuous Infusions: . sodium chloride Stopped (04/03/18 0618)  . piperacillin-tazobactam (ZOSYN)  IV 3.375 g (04/03/18 0615)  . vancomycin Stopped (04/03/18 0016)     LOS: 3 days    Signature  Lala Lund M.D on 04/03/2018 at 9:33 AM   -  To page go to [email or web address]

## 2018-04-03 NOTE — Care Management Important Message (Signed)
Important Message  Patient Details  Name: Gregory Hansen MRN: [id number] Date of Birth: May 02, 1950   Medicare Important Message Given:  Yes    Macenzie Burford P [place] 04/03/2018, 11:29 AM

## 2018-04-04 LAB — COMPREHENSIVE METABOLIC PANEL
ALK PHOS: 118 U/L (ref 38–126)
ALT: 41 U/L (ref 0–44)
AST: 53 U/L — ABNORMAL HIGH (ref 15–41)
Albumin: 1.9 g/dL — ABNORMAL LOW (ref 3.5–5.0)
Anion gap: 7 (ref 5–15)
BUN: 9 mg/dL (ref 8–23)
CO2: 21 mmol/L — ABNORMAL LOW (ref 22–32)
Calcium: 7.7 mg/dL — ABNORMAL LOW (ref 8.9–10.3)
Chloride: 102 mmol/L (ref 98–111)
Creatinine, Ser: 0.96 mg/dL (ref 0.61–1.24)
GFR calc Af Amer: >60 mL/min (ref >60)
GFR calc non Af Amer: >60 mL/min (ref >60)
GLUCOSE: 99 mg/dL (ref 70–99)
Potassium: 3.7 mmol/L (ref 3.5–5.1)
Sodium: 130 mmol/L — ABNORMAL LOW (ref 135–145)
Total Bilirubin: 1.4 mg/dL — ABNORMAL HIGH (ref 0.3–1.2)
Total Protein: 7.2 g/dL (ref 6.5–8.1)

## 2018-04-04 LAB — CULTURE, BLOOD (ROUTINE X 2)
Culture: NO GROWTH
Culture: NO GROWTH
Special Requests: ADEQUATE
Special Requests: ADEQUATE

## 2018-04-04 LAB — OSMOLALITY: Osmolality: 274 mOsm/kg — ABNORMAL LOW (ref 275–295)

## 2018-04-04 LAB — URIC ACID: Uric Acid, Serum: 2.5 mg/dL — ABNORMAL LOW (ref 3.7–8.6)

## 2018-04-04 LAB — OSMOLALITY, URINE: Osmolality, Ur: 140 mOsm/kg — ABNORMAL LOW (ref 300–900)

## 2018-04-04 LAB — MAGNESIUM: Magnesium: 1.5 mg/dL — ABNORMAL LOW (ref 1.7–2.4)

## 2018-04-04 LAB — SODIUM, URINE, RANDOM: Sodium, Ur: <10 mmol/L

## 2018-04-04 MED ORDER — DOXYCYCLINE HYCLATE 100 MG PO TABS
100.0000 mg | ORAL_TABLET | Freq: Two times a day (BID) | ORAL | Status: DC
  Administered 2018-04-04 – 2018-04-05 (×3): 100 mg via ORAL
  Filled 2018-04-04 (×3): qty 1

## 2018-04-04 MED ORDER — FUROSEMIDE 40 MG PO TABS
40.0000 mg | ORAL_TABLET | Freq: Once | ORAL | Status: AC
  Administered 2018-04-04: 40 mg via ORAL
  Filled 2018-04-04: qty 1

## 2018-04-04 MED ORDER — MAGNESIUM SULFATE 2 GM/50ML IV SOLN
2.0000 g | Freq: Once | INTRAVENOUS | Status: AC
  Administered 2018-04-04: 2 g via INTRAVENOUS
  Filled 2018-04-04: qty 50

## 2018-04-04 NOTE — Progress Notes (Signed)
[facility] TEAM 1 - Stepdown/ICU TEAM  Gregory Hansen  EYC:[id number] DOB: 1950-04-04 DOA: 03/30/2018 PCP: [name], DO    Brief Narrative:  68 y.o. male with a hx of CAD s/p CABG, HTN, and HLD who presented w/ a  3-4 day history of RLE erythema w/ associated lightheadedness. He first went to UC, where they found him to be hypotensive and sent him to the ED.   In the ED his initial SBP was in the 60s. Plain film X rays were neg for subq air. He was found to have a creat of 4, K 2.6, sodium 125, and lactate 3.2.  Significant Events: 1/20 admit   Subjective:  Patient in bed, appears comfortable, denies any headache, no fever, no chest pain or pressure, no shortness of breath , no abdominal pain. No focal weakness.  Assessment & Plan:  Septic shock due to severe right lower extremity cellulitis suspicious for Necrotizing fasciitis.  MRI lower extremity ruled out necrotizing fasciitis, seen by orthopedics.  He was treated with broad-spectrum IV antibiotics and his leg was elevated with 3 pillows, on 04/04/2018 remarkable improvement, will transition to oral doxycycline, requested to continue elevating his leg while in the bed, reevaluate in the morning if better discharge on oral antibiotic on 04/05/2018.  ARF -due to ATN from sepsis and shock, resolved after hydration.    Hypokalemia & Hypomagnesium  -replaced will recheck in the morning.   Mild Transaminitis -  shock liver due to sepsis and hypotension, improved.  Monitor trend.  CAD s/p CABG 2015 - Asymptomatic, continue beta-blocker and statin for secondary prevention.  Hyponatremia.  Appears to be SIADH.  1 dose Lasix and repeat BMP in the morning.  Serum uric acid is less than 2.5.  Urine electrolytes are pending.    DVT prophylaxis: Subcutaneous heparin Code Status: FULL CODE Family Communication: no family present at time of exam  Disposition Plan: Home when stable  Consultants:  Ortho  Anti-infectives (From  admission, onward)   Start     Dose/Rate Route Frequency Ordered Stop   04/04/18 1145  doxycycline (VIBRA-TABS) tablet 100 mg     100 mg Oral Every 12 hours 04/04/18 1132     04/02/18 2100  vancomycin (VANCOCIN) IVPB 750 mg/150 ml premix  Status:  Discontinued     750 mg 150 mL/hr over 60 Minutes Intravenous Every 12 hours 04/02/18 1310 04/04/18 1132   04/01/18 0930  vancomycin (VANCOCIN) 1,250 mg in sodium chloride 0.9 % 250 mL IVPB  Status:  Discontinued     1,250 mg 166.7 mL/hr over 90 Minutes Intravenous Every 24 hours 04/01/18 0915 04/02/18 1309   03/31/18 0600  piperacillin-tazobactam (ZOSYN) IVPB 3.375 g  Status:  Discontinued     3.375 g 12.5 mL/hr over 240 Minutes Intravenous Every 8 hours 03/30/18 2223 04/04/18 1132   03/31/18 0600  clindamycin (CLEOCIN) IVPB 900 mg  Status:  Discontinued     900 mg 100 mL/hr over 30 Minutes Intravenous Every 8 hours 03/31/18 0032 04/02/18 1039   03/30/18 2222  vancomycin variable dose per unstable renal function (pharmacist dosing)  Status:  Discontinued      Does not apply See admin instructions 03/30/18 2223 04/01/18 0915   03/30/18 2215  vancomycin (VANCOCIN) 2,000 mg in sodium chloride 0.9 % 500 mL IVPB     2,000 mg 250 mL/hr over 120 Minutes Intravenous  Once 03/30/18 2153 03/31/18 0102   03/30/18 2200  piperacillin-tazobactam (ZOSYN) IVPB 3.375 g  3.375 g 100 mL/hr over 30 Minutes Intravenous  Once 03/30/18 2153 03/30/18 2247   03/30/18 2200  clindamycin (CLEOCIN) IVPB 900 mg     900 mg 100 mL/hr over 30 Minutes Intravenous  Once 03/30/18 2153 03/30/18 2247      Objective: Blood pressure (!) 146/78, pulse 75, temperature 98.1 F (36.7 C), temperature source Oral, resp. rate 19, height 6\' 2"  (1.88 m), weight 111.8 kg, SpO2 99 %.  Intake/Output Summary (Last 24 hours) at 04/04/2018 1132 Last data filed at 04/04/2018 0900 Gross per 24 hour  Intake 840 ml  Output 750 ml  Net 90 ml   Filed Weights   03/31/18 2316  Weight: 111.8  kg    Examination:  Awake Alert, Oriented X 3, No new F.N deficits, Normal affect [place].AT,PERRAL Supple Neck,No JVD, No cervical lymphadenopathy appriciated.  Symmetrical Chest wall movement, Good air movement bilaterally, CTAB RRR,No Gallops, Rubs or new Murmurs, No Parasternal Heave +ve B.Sounds, Abd Soft, No tenderness, No organomegaly appriciated, No rebound - guarding or rigidity. No Cyanosis,  extensive right lower extremity cellulitis angry looking skin up to right knee, some streaking noted going up the thigh, per patient streaking much improved.  Redness improved, foot appears to be involved the most   CBC: Recent Labs  Lab 03/30/18 2115 03/31/18 0324 04/01/18 0332 04/02/18 0430  WBC 12.5* 10.5 5.9 4.6  NEUTROABS 10.3*  --   --   --   HGB 12.9* 11.5* 11.3* 11.7*  HCT 39.0 35.8* 33.7* 35.8*  MCV 95.6 94.0 93.6 94.0  PLT 151 124* 121* 222*   Basic Metabolic Panel: Recent Labs  Lab 04/02/18 0430 04/03/18 0636 04/04/18 0313  NA 130* 130* 130*  K 3.2* 3.4* 3.7  CL 102 102 102  CO2 21* 20* 21*  GLUCOSE 90 85 99  BUN 16 11 9   CREATININE 1.35* 1.16 0.96  CALCIUM 7.3* 7.8* 7.7*  MG  --  1.2* 1.5*   GFR: Estimated Creatinine Clearance: 97.9 mL/min (by C-G formula based on SCr of 0.96 mg/dL).  Liver Function Tests: Recent Labs  Lab 03/30/18 2115 04/01/18 0332 04/02/18 0430 04/04/18 0313  AST 114* 120* 101* 53*  ALT 64* 62* 59* 41  ALKPHOS 112 119 123 118  BILITOT 1.9* 1.5* 1.5* 1.4*  PROT 7.5 7.1 7.7 7.2  ALBUMIN 2.3* 2.0* 2.0* 1.9*    Coagulation Profile: Recent Labs  Lab 03/30/18 2115  INR 1.24    Cardiac Enzymes: Recent Labs  Lab 03/30/18 2115  CKTOTAL 89    HbA1C: Hgb A1c MFr Bld  Date/Time Value Ref Range Status  04/01/2018 03:32 AM 5.2 4.8 - 5.6 % Final    Comment:    (NOTE) Pre diabetes:          5.7%-6.4% Diabetes:              >6.4% Glycemic control for   <7.0% adults with diabetes   11/23/2016 07:44 AM 5.2 4.8 - 5.6 % Final      Comment:    (NOTE) Pre diabetes:          5.7%-6.4% Diabetes:              >6.4% Glycemic control for   <7.0% adults with diabetes     CBG: Recent Labs  Lab 03/31/18 0411 03/31/18 1426  GLUCAP 78 85    Recent Results (from the past 240 hour(s))  Culture, blood (Routine x 2)     Status: None   Collection Time: 03/30/18  8:45 PM  Result Value Ref Range Status   Specimen Description BLOOD RIGHT HAND  Final   Special Requests   Final    BOTTLES DRAWN AEROBIC AND ANAEROBIC Blood Culture adequate volume   Culture   Final    NO GROWTH 5 DAYS Performed at [facility], [address], [place], [place] [address]    Report Status 04/04/2018 FINAL  Final  Culture, blood (Routine x 2)     Status: None   Collection Time: 03/30/18  9:00 PM  Result Value Ref Range Status   Specimen Description BLOOD LEFT ANTECUBITAL  Final   Special Requests   Final    BOTTLES DRAWN AEROBIC AND ANAEROBIC Blood Culture adequate volume   Culture   Final    NO GROWTH 5 DAYS Performed at [facility], [place] [address] [place], [place] [address]    Report Status 04/04/2018 FINAL  Final  MRSA PCR Screening     Status: None   Collection Time: 03/31/18  6:00 PM  Result Value Ref Range Status   MRSA by PCR NEGATIVE NEGATIVE Final    Comment:        The GeneXpert MRSA Assay (FDA approved for NASAL specimens only), is one component of a comprehensive MRSA colonization surveillance program. It is not intended to diagnose MRSA infection nor to guide or monitor treatment for MRSA infections. Performed at [facility], [place] [address] [place], [place] [address]      Scheduled Meds: . amLODipine  10 mg Oral Daily  . buPROPion  300 mg Oral Daily  . doxycycline  100 mg Oral Q12H  . fluticasone  1 spray Each Nare Daily  . furosemide  40 mg Oral Once  . heparin  5,000 Units Subcutaneous Q8H  . metoprolol succinate  50 mg Oral Daily  . nefazodone  200 mg Oral BID  . pantoprazole   40 mg Oral Daily  . vitamin B-12  1,000 mcg Oral Daily   Continuous Infusions: . sodium chloride Stopped (04/03/18 0618)     LOS: 4 days   Signature  Lala Lund M.D on 04/04/2018 at 11:32 AM   -  To page go to [email or web address]

## 2018-04-04 NOTE — Plan of Care (Signed)
  Problem: Education: Goal: Knowledge of General Education information will improve Description: Including pain rating scale, medication(s)/side effects and non-pharmacologic comfort measures Outcome: Progressing   Problem: Clinical Measurements: Goal: Will remain free from infection Outcome: Progressing   

## 2018-04-05 DIAGNOSIS — Z23 Encounter for immunization: Secondary | ICD-10-CM | POA: Diagnosis not present

## 2018-04-05 LAB — BASIC METABOLIC PANEL
Anion gap: 7 (ref 5–15)
BUN: 8 mg/dL (ref 8–23)
CO2: 21 mmol/L — ABNORMAL LOW (ref 22–32)
Calcium: 8.1 mg/dL — ABNORMAL LOW (ref 8.9–10.3)
Chloride: 103 mmol/L (ref 98–111)
Creatinine, Ser: 1.07 mg/dL (ref 0.61–1.24)
GFR calc Af Amer: >60 mL/min (ref >60)
GFR calc non Af Amer: >60 mL/min (ref >60)
Glucose, Bld: 91 mg/dL (ref 70–99)
Potassium: 3.9 mmol/L (ref 3.5–5.1)
Sodium: 131 mmol/L — ABNORMAL LOW (ref 135–145)

## 2018-04-05 LAB — MAGNESIUM: Magnesium: 1.6 mg/dL — ABNORMAL LOW (ref 1.7–2.4)

## 2018-04-05 MED ORDER — AMLODIPINE BESYLATE 10 MG PO TABS: 10.0000 mg | ORAL_TABLET | Freq: Every day | ORAL | 0 refills | Status: DC

## 2018-04-05 MED ORDER — FUROSEMIDE 10 MG/ML IJ SOLN
20.0000 mg | Freq: Once | INTRAMUSCULAR | Status: AC
  Administered 2018-04-05: 20 mg via INTRAVENOUS
  Filled 2018-04-05: qty 2

## 2018-04-05 MED ORDER — MAGNESIUM SULFATE 2 GM/50ML IV SOLN
2.0000 g | Freq: Once | INTRAVENOUS | Status: AC
  Administered 2018-04-05: 2 g via INTRAVENOUS
  Filled 2018-04-05: qty 50

## 2018-04-05 MED ORDER — DOXYCYCLINE HYCLATE 100 MG PO TABS: 100.0000 mg | ORAL_TABLET | Freq: Two times a day (BID) | ORAL | 0 refills | Status: DC

## 2018-04-05 MED ORDER — POTASSIUM CHLORIDE CRYS ER 20 MEQ PO TBCR
20.0000 meq | EXTENDED_RELEASE_TABLET | Freq: Once | ORAL | Status: AC
  Administered 2018-04-05: 20 meq via ORAL
  Filled 2018-04-05: qty 1

## 2018-04-05 NOTE — Progress Notes (Signed)
Dillard Essex Mondor to be D/C'd home per MD order. Discussed with the patient and all questions fully answered. VVS, Skin clean, dry and intact without evidence of skin break down, no evidence of skin tears noted.  IV catheter discontinued intact. Site without signs and symptoms of complications. Dressing and pressure applied.  An After Visit Summary was printed and given to the patient.  Patient escorted via [place], and D/C home via private auto.  Melonie Florida  04/05/2018 11:10 AM

## 2018-04-05 NOTE — Discharge Instructions (Signed)
Keep your right leg elevated under 3 pillows at all times when you are in bed for the next 1 week.    Follow with Primary MD [name], DO in 7 days   Get CBC, CMP, Magnesium checked  by Primary MD in 5-7 days    Activity: As tolerated with Full fall precautions use walker/cane & assistance as needed  Disposition Home   Diet: Heart Healthy   Special Instructions: If you have smoked or chewed Tobacco  in the last 2 yrs please stop smoking, stop any regular Alcohol  and or any Recreational drug use.  On your next visit with your primary care physician please Get Medicines reviewed and adjusted.  Please request your Prim.MD to go over all Hospital Tests and Procedure/Radiological results at the follow up, please get all Hospital records sent to your Prim MD by signing hospital release before you go home.  If you experience worsening of your admission symptoms, develop shortness of breath, life threatening emergency, suicidal or homicidal thoughts you must seek medical attention immediately by calling 911 or calling your MD immediately  if symptoms less severe.  You Must read complete instructions/literature along with all the possible adverse reactions/side effects for all the Medicines you take and that have been prescribed to you. Take any new Medicines after you have completely understood and accpet all the possible adverse reactions/side effects.

## 2018-04-05 NOTE — Discharge Summary (Signed)
Gregory Hansen VQM:[id number] DOB: 01/01/1951 DOA: 03/30/2018  PCP: [name], DO  Admit date: 03/30/2018  Discharge date: 04/05/2018  Admitted From: Home   Disposition:  Home   Recommendations for Outpatient Follow-up:   Follow up with PCP in 1-2 weeks  PCP Please obtain BMP/CBC, 2 view CXR in 1week,  (see Discharge instructions)   PCP Please follow up on the following pending results:    Home Health: None   Equipment/Devices: None  Consultations: Ortho Discharge Condition: Stable CODE STATUS: Full   Diet Recommendation: Heart Healthy     Chief Complaint  Patient presents with  . Hypotension  . Cellulitis     Brief history of present illness from the day of admission and additional interim summary    67 y.o.malewith a hx ofCAD s/p CABG, HTN, and HLD who presented w/ a  3-4 day history of RLE erythema w/ associated lightheadedness. He first went to UC, where they found him to be hypotensive and sent him to the ED.   In the ED his initial SBP was in the 60s. Plain film X rays were neg for subq air. He was found to have a creat of 4, K 2.6, sodium 125, and lactate 3.2.                                                                 Hospital Course   Septic shock due to severe right lower extremity cellulitis suspicious for Necrotizing fasciitis.  MRI lower extremity ruled out necrotizing fasciitis, seen by orthopedics.  He was treated with broad-spectrum IV antibiotics and his leg was elevated with 3 pillows, on 04/04/2018 remarkable improvement, now stable on doxycycline, remarkable improvement upon leg elevation with pillows under the right leg for the last 2 days.  At this time he is close to his baseline will be discharged on 6 more days of oral doxycycline, requested to keep his legs elevated  under 3 pillows while in bed for the next 1 week and follow-up with PCP in a week.  ARF - due to ATN from sepsis and shock, resolved after hydration.    Hypokalemia & Hypomagnesium  -replaced request PCP to recheck next visit.   Mild Transaminitis -  shock liver due to sepsis and hypotension, improved.  Monitor trend.  CAD s/p CABG 2015 - Asymptomatic, continue beta-blocker and statin for secondary prevention.  Hyponatremia.  Appears to be SIADH.  Improvement with Lasix, Lasix dose repeated prior to discharge, PCP to recheck BMP next visit.   Discharge diagnosis     Principal Problem:   Severe sepsis with acute organ dysfunction (HCC) Active Problems:   S/P CABG x 4   Cellulitis   Acute kidney failure [facility])    Discharge instructions    Discharge Instructions    Diet -  low sodium heart healthy   Complete by:  As directed    Discharge instructions   Complete by:  As directed    Keep your right leg elevated under 3 pillows at all times when you are in bed for the next 1 week.    Follow with Primary MD [name], DO in 7 days   Get CBC, CMP, Magnesium  checked  by Primary MD in 5-7 days    Activity: As tolerated with Full fall precautions use walker/cane & assistance as needed  Disposition Home   Diet: Heart Healthy   Special Instructions: If you have smoked or chewed Tobacco  in the last 2 yrs please stop smoking, stop any regular Alcohol  and or any Recreational drug use.  On your next visit with your primary care physician please Get Medicines reviewed and adjusted.  Please request your Prim.MD to go over all Hospital Tests and Procedure/Radiological results at the follow up, please get all Hospital records sent to your Prim MD by signing hospital release before you go home.  If you experience worsening of your admission symptoms, develop shortness of breath, life threatening emergency, suicidal or homicidal thoughts you must seek medical attention  immediately by calling 911 or calling your MD immediately  if symptoms less severe.  You Must read complete instructions/literature along with all the possible adverse reactions/side effects for all the Medicines you take and that have been prescribed to you. Take any new Medicines after you have completely understood and accpet all the possible adverse reactions/side effects.   Increase activity slowly   Complete by:  As directed       Discharge Medications   Allergies as of 04/05/2018   No Known Allergies     Medication List    TAKE these medications   amLODipine 10 MG tablet Commonly known as:  NORVASC Take 1 tablet (10 mg total) by mouth daily. Start taking on:  April 06, 2018   aspirin 81 MG tablet Take 81 mg by mouth every evening.   atorvastatin 80 MG tablet Commonly known as:  LIPITOR Take 1 tablet (80 mg total) by mouth daily. What changed:  when to take this   buPROPion 300 MG 24 hr tablet Commonly known as:  WELLBUTRIN XL Take 300 mg by mouth daily.   doxycycline 100 MG tablet Commonly known as:  VIBRA-TABS Take 1 tablet (100 mg total) by mouth every 12 (twelve) hours.   fluticasone 50 MCG/ACT nasal spray Commonly known as:  FLONASE Place 2 sprays into both nostrils every evening.   ibuprofen 200 MG tablet Commonly known as:  ADVIL,MOTRIN Take 400 mg by mouth at bedtime.   loratadine 10 MG tablet Commonly known as:  CLARITIN Take 10 mg by mouth daily.   metoprolol succinate 50 MG 24 hr tablet Commonly known as:  TOPROL-XL Take 1 tablet (50 mg total) by mouth daily.   nefazodone 200 MG tablet Commonly known as:  SERZONE Take 200 mg by mouth 2 (two) times daily.   nitroGLYCERIN 0.4 MG SL tablet Commonly known as:  NITROSTAT Place 1 tablet (0.4 mg total) every 5 (five) minutes as needed under the tongue for chest pain. Up to 3 doses What changed:    when to take this  additional instructions   ondansetron 4 MG disintegrating tablet Commonly  known as:  ZOFRAN-ODT Take 1 tablet (4 mg total) by mouth every 8 (eight) hours as needed for nausea or vomiting.   pantoprazole 40 MG tablet  Commonly known as:  PROTONIX Take 1 tablet (40 mg total) by mouth daily.   ramipril 10 MG capsule Commonly known as:  ALTACE Take 1 capsule (10 mg total) by mouth 2 (two) times daily.   vitamin B-12 1000 MCG tablet Commonly known as:  CYANOCOBALAMIN Take 1,000 mcg by mouth daily.       Follow-up Information    [name], DO. Schedule an appointment as soon as possible for a visit in 1 week(s).   Specialty:  Family Medicine Contact information: [address] [place] [address] [phone]        [name], MD .   Specialty:  Cardiology Contact information: [phone] [address] [place] [address] [place] [address] [phone]           Major procedures and Radiology Reports - PLEASE review detailed and final reports thoroughly  -        Dg Tibia/fibula Right  Result Date: 03/30/2018 CLINICAL DATA:  Initial evaluation for acute right lower extremity pain. EXAM: RIGHT TIBIA AND FIBULA - 2 VIEW COMPARISON:  Prior radiograph from 08/17/2007. FINDINGS: No acute fracture or dislocation. Moderate osteoarthritic changes noted about the knee. No acute soft tissue abnormality. Scattered vascular calcifications noted within the leg. IMPRESSION: No acute osseous abnormality about the right tibia/fibula. Electronically Signed   By: Jeannine Boga M.D.   On: 03/30/2018 22:48   Dg Ankle 2 Views Right  Result Date: 03/30/2018 CLINICAL DATA:  Initial evaluation for acute right lower extremity pain. EXAM: RIGHT ANKLE - 2 VIEW COMPARISON:  None. FINDINGS: No acute fracture or dislocation. Talar dome intact. Ankle mortise grossly approximated. Osseous mineralization normal. No acute soft tissue abnormality. Scattered vascular calcifications noted. IMPRESSION: No acute osseous abnormality about the right ankle.  Electronically Signed   By: Jeannine Boga M.D.   On: 03/30/2018 22:47   Mr Tibia Fibula Right Wo Contrast  Result Date: 03/31/2018 CLINICAL DATA:  Right lower leg redness, swelling, and pain. EXAM: MRI OF LOWER RIGHT EXTREMITY WITHOUT CONTRAST TECHNIQUE: Multiplanar, multisequence MR imaging of the right lower leg was performed. No intravenous contrast was administered. COMPARISON:  Right tibia fibula x-rays from same day. FINDINGS: Bones/Joint/Cartilage No marrow signal abnormality. No fracture or dislocation. Normal alignment. Small left knee joint effusion. Small right and moderate left Baker cysts. Ligaments The bilateral ACL, PCL, MCL, and LCL complexes are grossly intact. Suspected tears of the left medial meniscus body and right medial meniscus body/posterior horn junction. Muscles and Tendons Flexor, extensor, peroneal, and Achilles tendons are intact. No muscle edema. Mild diffuse fatty infiltration of the lower leg muscles in all compartments. Soft tissue Moderate circumferential soft tissue swelling of the right lower leg. No fluid collection or hematoma. No soft tissue mass. IMPRESSION: 1. Moderate circumferential superficial soft tissue swelling of the right lower leg, suspicious for cellulitis given clinical history. No evidence of deeper infection. No definite foci of gas to suggest necrotizing infection, however CT is more sensitive. 2. No abscess.  No acute osseous abnormality. 3. Suspected tears of the right medial meniscus body/posterior horn junction and left medial meniscus body. 4. Small right and moderate left Baker cysts. Electronically Signed   By: Titus Dubin M.D.   On: 03/31/2018 08:13   Dg Foot 2 Views Right  Result Date: 03/30/2018 CLINICAL DATA:  Initial evaluation for acute right lower extremity pain. EXAM: RIGHT FOOT - 2 VIEW COMPARISON:  None. FINDINGS: No acute fracture dislocation. Joint spaces fairly well-maintained without significant degenerative or  erosive  arthropathy. Osseous mineralization normal. No acute soft tissue abnormality. Scattered vascular calcifications noted anterior to the right foot and ankle. IMPRESSION: No acute osseous abnormality about the right foot. Electronically Signed   By: Jeannine Boga M.D.   On: 03/30/2018 22:50    Micro Results     Recent Results (from the past 240 hour(s))  Culture, blood (Routine x 2)     Status: None   Collection Time: 03/30/18  8:45 PM  Result Value Ref Range Status   Specimen Description BLOOD RIGHT HAND  Final   Special Requests   Final    BOTTLES DRAWN AEROBIC AND ANAEROBIC Blood Culture adequate volume   Culture   Final    NO GROWTH 5 DAYS Performed at [facility], [address], [place], [place] [address]    Report Status 04/04/2018 FINAL  Final  Culture, blood (Routine x 2)     Status: None   Collection Time: 03/30/18  9:00 PM  Result Value Ref Range Status   Specimen Description BLOOD LEFT ANTECUBITAL  Final   Special Requests   Final    BOTTLES DRAWN AEROBIC AND ANAEROBIC Blood Culture adequate volume   Culture   Final    NO GROWTH 5 DAYS Performed at [facility], [place] [address] [place], [place] [address]    Report Status 04/04/2018 FINAL  Final  MRSA PCR Screening     Status: None   Collection Time: 03/31/18  6:00 PM  Result Value Ref Range Status   MRSA by PCR NEGATIVE NEGATIVE Final    Comment:        The GeneXpert MRSA Assay (FDA approved for NASAL specimens only), is one component of a comprehensive MRSA colonization surveillance program. It is not intended to diagnose MRSA infection nor to guide or monitor treatment for MRSA infections. Performed at [facility], [place] [address] [place], [place] [address]     Today   Subjective    Gregory Hansen today has no headache,no chest abdominal pain,no new weakness tingling or numbness, feels much better wants to go home today.     Objective   Blood pressure (!) 157/81, pulse 75,  temperature 98.3 F (36.8 C), resp. rate 20, height 6\' 2"  (1.88 m), weight 111.8 kg, SpO2 98 %.   Intake/Output Summary (Last 24 hours) at 04/05/2018 0909 Last data filed at 04/04/2018 1400 Gross per 24 hour  Intake 240 ml  Output 480 ml  Net -240 ml    Exam Awake Alert, Oriented x 3, No new F.N deficits, Normal affect [place].AT,PERRAL Supple Neck,No JVD, No cervical lymphadenopathy appriciated.  Symmetrical Chest wall movement, Good air movement bilaterally, CTAB RRR,No Gallops,Rubs or new Murmurs, No Parasternal Heave +ve B.Sounds, Abd Soft, Non tender, No organomegaly appriciated, No rebound -guarding or rigidity. No Cyanosis, Clubbing or edema, R leg redness is now minimal, no warmth   Data Review   CBC w Diff:  Lab Results  Component Value Date   WBC 4.6 04/02/2018   HGB 11.7 (L) 04/02/2018   HCT 35.8 (L) 04/02/2018   PLT 141 (L) 04/02/2018   LYMPHOPCT 6 03/30/2018   MONOPCT 8 03/30/2018   EOSPCT 2 03/30/2018   BASOPCT 1 03/30/2018    CMP:  Lab Results  Component Value Date   NA 131 (L) 04/05/2018   NA 133 (L) 03/27/2018   K 3.9 04/05/2018   CL 103 04/05/2018   CO2 21 (L) 04/05/2018   BUN 8  04/05/2018   BUN 6 (L) 03/27/2018   CREATININE 1.07 04/05/2018   CREATININE 0.75 03/23/2015   PROT 7.2 04/04/2018   PROT 7.4 04/12/2016   ALBUMIN 1.9 (L) 04/04/2018   ALBUMIN 4.0 04/12/2016   BILITOT 1.4 (H) 04/04/2018   BILITOT 0.7 04/12/2016   ALKPHOS 118 04/04/2018   AST 53 (H) 04/04/2018   ALT 41 04/04/2018  .   Total Time in preparing paper work, data evaluation and todays exam - 69 minutes  Lala Lund M.D on 04/05/2018 at 9:09 AM  [facility]   Office  [phone]

## 2018-04-08 ENCOUNTER — Other Ambulatory Visit: Payer: Self-pay

## 2018-04-08 NOTE — Patient Outreach (Signed)
[facility]) Care Management  04/08/2018  Gregory Hansen Floor January 25, 1951 [id number]    EMMI-General Discharge RED ON EMMI ALERT Day # 1 Date: 04/07/2018 Red Alert Reason: " Scheduled follow up? No"   Outreach attempt # 1 to patient. Spoke with patient. He voices that he is doing well. He denies any acute issues or concerns at this time. Reviewed and addressed red alert with patient. He states that after he got EMMI call on yesterday he called PCP office and made an appt. Appt is scheduled for 04/20/2018 which is the earliest appt that was available. Per d/c summary patient to follow up with Dr. Burt Knack as well. Patient states he had forgotten about that. RN CM confirmed that patient has paperwork and he states that he will call office. He denies any issues with transportation. Patient states that he daughter has gotten his meds and he denies any issues regarding them. Advised patient that they would get one more automated EMMI-GENERAL post discharge calls to assess how they are doing following recent hospitalization and will receive a call from a nurse if any of their responses were abnormal. Patient voiced understanding and was appreciative of f/u call.    Plan: RN CM will close case as no further interventions needed at this time.   Enzo Montgomery, RN,BSN,CCM [facility] Telephonic Care Management Coordinator Direct Phone: [phone] Toll Free: [phone] Fax: [phone]

## 2018-04-20 ENCOUNTER — Ambulatory Visit (INDEPENDENT_AMBULATORY_CARE_PROVIDER_SITE_OTHER): Payer: Medicare Other | Admitting: Family Medicine

## 2018-04-20 ENCOUNTER — Encounter: Payer: Self-pay | Admitting: Family Medicine

## 2018-04-20 ENCOUNTER — Other Ambulatory Visit: Payer: Self-pay

## 2018-04-20 VITALS — BP 114/60 | HR 68 | Temp 97.6°F | Wt 234.5 lb

## 2018-04-20 DIAGNOSIS — L03115 Cellulitis of right lower limb: Secondary | ICD-10-CM | POA: Diagnosis not present

## 2018-04-20 DIAGNOSIS — R350 Frequency of micturition: Secondary | ICD-10-CM | POA: Diagnosis not present

## 2018-04-20 DIAGNOSIS — N179 Acute kidney failure, unspecified: Secondary | ICD-10-CM | POA: Diagnosis not present

## 2018-04-20 NOTE — Progress Notes (Signed)
   CC: hosp fu sepsis and cellulitis  HPI  Hosp fu: can't recall any injury that started this - he thinks his skin may have split over dry area. He did have some skin peeling. Has been using coconut oil to hydrate the leg. He had hx of surgery to R 4th toe to remove his tendon to graft onto R 5th digit for slice injury. Occasional asymmetric swelling of the R leg due to this is the leg they harvested his CABG from. No fever or feeling bad since leaving the hospital. Occasional burning sensation over the R leg. Feels better if he elevates the leg. Finished doxy. Leg gets much less red after elevation.   He denies any SOB. None while hospitalized as well. Has been overall tired, staying in bed a lot. Endorses nighttime urination every 2 hours or so but no dysuria or weak stream.   Med review - he is taking 2 ibuprofen every night for back pain due to AS. No lasix at home.   ROS: Denies CP, SOB, abdominal pain, dysuria, changes in BMs.   CC, SH/smoking status, and VS noted  Objective: BP 114/60   Pulse 68   Temp 97.6 F (36.4 C) (Oral)   Wt 234 lb 8 oz (106.4 kg)   SpO2 98%   BMI 30.11 kg/m  Gen: NAD, alert, cooperative, and pleasant. HEENT: NCAT, EOMI, PERRL CV: RRR, no murmur Resp: CTAB, no wheezes, non-labored Abd: SNTND, BS present, no guarding or organomegaly Ext: LLE without edema or skin changes, RLE with erythema when dependent, which decreases significantly when elevated. Unable to appreciate DP pulse. No skin breakdown, scaling of skin around entire lower leg. Nontender, no edema.  Neuro: Alert and oriented, Speech clear, No gross deficits  Assessment and plan:  Hospital follow up RLE cellulitis and sepsis: much improved, finished his abx without any return of infectious sxs. Suspect RLE erythema may be venous insufficiency, especially since his CABG was harvested from this leg. We will continue elevation, compression, and hydration of overlying skin. He denies claudication  sxs. If persistent erythema or claudication sxs, would eval with vascular studies. Asked him to return in 1 month to see PCP To see how this recovers. We will check CBC and CMP as instructed in D/c summ. I do not see any pulmonary complaints or abnormalities on exam to necessitate a CXR as mentioned in dc summ.   Nighttime urination - asked him to decrease after dinner water intake and follow up with PCP in 1 month to check on how this is going.   AS/back pain - asked him to decreased daily ibuprofen dose given concomittant ACEi and frequent AKI.   Orders Placed This Encounter  Procedures  . CBC  . CMP14+EGFR    No orders of the defined types were placed in this encounter.   Ralene Ok, MD, PGY3 04/20/2018 10:30 AM

## 2018-04-20 NOTE — Patient Instructions (Signed)
It was a pleasure to see you today! Thank you for choosing [facility] for your primary care. Gregory Hansen was seen for hospital follow up.   Our plans for today were:  We will check on your labs today.   Try not to drink water after 6/7pm to see if that helps with your nighttime bathroom trips. If not, ask Dr. Ky Barban next time.   Keep trying to decrease your ibuprofen. You can also try a topical cream for back pain, like Capsacin or Icy Hot.    Best,  Dr. Lindell Noe

## 2018-04-21 ENCOUNTER — Other Ambulatory Visit: Payer: Self-pay | Admitting: Family Medicine

## 2018-04-21 DIAGNOSIS — I10 Essential (primary) hypertension: Secondary | ICD-10-CM

## 2018-04-21 LAB — CBC
Hematocrit: 35.3 % — ABNORMAL LOW (ref 37.5–51.0)
Hemoglobin: 12 g/dL — ABNORMAL LOW (ref 13.0–17.7)
MCH: 31.3 pg (ref 26.6–33.0)
MCHC: 34 g/dL (ref 31.5–35.7)
MCV: 92 fL (ref 79–97)
Platelets: 210 10*3/uL (ref 150–450)
RBC: 3.84 x10E6/uL — ABNORMAL LOW (ref 4.14–5.80)
RDW: 13.6 % (ref 11.6–15.4)
WBC: 8.2 10*3/uL (ref 3.4–10.8)

## 2018-04-21 LAB — CMP14+EGFR
ALT: 24 IU/L (ref 0–44)
AST: 37 IU/L (ref 0–40)
Albumin/Globulin Ratio: 0.7 — ABNORMAL LOW (ref 1.2–2.2)
Albumin: 3.4 g/dL — ABNORMAL LOW (ref 3.8–4.8)
Alkaline Phosphatase: 164 IU/L — ABNORMAL HIGH (ref 39–117)
BILIRUBIN TOTAL: 0.6 mg/dL (ref 0.0–1.2)
BUN/Creatinine Ratio: 9 — ABNORMAL LOW (ref 10–24)
BUN: 14 mg/dL (ref 8–27)
CO2: 19 mmol/L — ABNORMAL LOW (ref 20–29)
Calcium: 8.5 mg/dL — ABNORMAL LOW (ref 8.6–10.2)
Chloride: 99 mmol/L (ref 96–106)
Creatinine, Ser: 1.57 mg/dL — ABNORMAL HIGH (ref 0.76–1.27)
GFR calc Af Amer: 52 mL/min/{1.73_m2} — ABNORMAL LOW (ref >59)
GFR calc non Af Amer: 45 mL/min/{1.73_m2} — ABNORMAL LOW (ref >59)
GLUCOSE: 110 mg/dL — AB (ref 65–99)
Globulin, Total: 4.9 g/dL — ABNORMAL HIGH (ref 1.5–4.5)
Potassium: 4 mmol/L (ref 3.5–5.2)
Sodium: 133 mmol/L — ABNORMAL LOW (ref 134–144)
Total Protein: 8.3 g/dL (ref 6.0–8.5)

## 2018-04-22 ENCOUNTER — Ambulatory Visit: Payer: Medicare Other | Admitting: Cardiology

## 2018-04-22 ENCOUNTER — Encounter: Payer: Self-pay | Admitting: Cardiology

## 2018-04-22 VITALS — BP 120/72 | HR 72 | Ht 74.0 in | Wt 232.8 lb

## 2018-04-22 DIAGNOSIS — I10 Essential (primary) hypertension: Secondary | ICD-10-CM | POA: Diagnosis not present

## 2018-04-22 DIAGNOSIS — E785 Hyperlipidemia, unspecified: Secondary | ICD-10-CM

## 2018-04-22 DIAGNOSIS — L03115 Cellulitis of right lower limb: Secondary | ICD-10-CM | POA: Diagnosis not present

## 2018-04-22 DIAGNOSIS — I2510 Atherosclerotic heart disease of native coronary artery without angina pectoris: Secondary | ICD-10-CM

## 2018-04-22 NOTE — Patient Instructions (Addendum)
Medication Instructions:  Your physician recommends that you continue on your current medications as directed. Please refer to the Current Medication list given to you today.  If you need a refill on your cardiac medications before your next appointment, please call your pharmacy.   Lab work: None  If you have labs (blood work) drawn today and your tests are completely normal, you will receive your results only by: Marland Kitchen MyChart Message (if you have MyChart) OR . A paper copy in the mail If you have any lab test that is abnormal or we need to change your treatment, we will call you to review the results.  Testing/Procedures: None   Follow-Up: At [facility], you and your health needs are our priority.  As part of our continuing mission to provide you with exceptional heart care, we have created designated Provider Care Teams.  These Care Teams include your primary Cardiologist (physician) and Advanced Practice Providers (APPs -  Physician Assistants and Nurse Practitioners) who all work together to provide you with the care you need, when you need it. You will need a follow up appointment in:  6 months.  Please call our office 2 months in advance to schedule this appointment.  You may see [name], MD or one of the following Advanced Practice Providers on your designated Care Team: Richardson Dopp, PA-C [place], [place] . Daune Perch, NP  You have been referred to our Lipid clinic   Any Other Special Instructions Will Be Listed Below (If Applicable).  Check blood pressure a couple of times per week, at different times of the day.  Goal blood pressure <130/80.    DASH Eating Plan DASH stands for "Dietary Approaches to Stop Hypertension." The DASH eating plan is a healthy eating plan that has been shown to reduce high blood pressure (hypertension). It may also reduce your risk for type 2 diabetes, heart disease, and stroke. The DASH eating plan may also help with weight loss. What  are tips for following this plan?  General guidelines  Avoid eating more than 2,300 mg (milligrams) of salt (sodium) a day. If you have hypertension, you may need to reduce your sodium intake to 1,500 mg a day.  Limit alcohol intake to no more than 1 drink a day for nonpregnant women and 2 drinks a day for men. One drink equals 12 oz of beer, 5 oz of wine, or 1 oz of hard liquor.  Work with your health care provider to maintain a healthy body weight or to lose weight. Ask what an ideal weight is for you.  Get at least 30 minutes of exercise that causes your heart to beat faster (aerobic exercise) most days of the week. Activities may include walking, swimming, or biking.  Work with your health care provider or diet and nutrition specialist (dietitian) to adjust your eating plan to your individual calorie needs. Reading food labels   Check food labels for the amount of sodium per serving. Choose foods with less than 5 percent of the Daily Value of sodium. Generally, foods with less than 300 mg of sodium per serving fit into this eating plan.  To find whole grains, look for the word "whole" as the first word in the ingredient list. Shopping  Buy products labeled as "low-sodium" or "no salt added."  Buy fresh foods. Avoid canned foods and premade or frozen meals. Cooking  Avoid adding salt when cooking. Use salt-free seasonings or herbs instead of table salt or sea salt. Check  with your health care provider or pharmacist before using salt substitutes.  Do not fry foods. Cook foods using healthy methods such as baking, boiling, grilling, and broiling instead.  Cook with heart-healthy oils, such as olive, canola, soybean, or sunflower oil. Meal planning  Eat a balanced diet that includes: ? 5 or more servings of fruits and vegetables each day. At each meal, try to fill half of your plate with fruits and vegetables. ? Up to 6-8 servings of whole grains each day. ? Less than 6 oz of  lean meat, poultry, or fish each day. A 3-oz serving of meat is about the same size as a deck of cards. One egg equals 1 oz. ? 2 servings of low-fat dairy each day. ? A serving of nuts, seeds, or beans 5 times each week. ? Heart-healthy fats. Healthy fats called Omega-3 fatty acids are found in foods such as flaxseeds and coldwater fish, like sardines, salmon, and mackerel.  Limit how much you eat of the following: ? Canned or prepackaged foods. ? Food that is high in trans fat, such as fried foods. ? Food that is high in saturated fat, such as fatty meat. ? Sweets, desserts, sugary drinks, and other foods with added sugar. ? Full-fat dairy products.  Do not salt foods before eating.  Try to eat at least 2 vegetarian meals each week.  Eat more home-cooked food and less restaurant, buffet, and fast food.  When eating at a restaurant, ask that your food be prepared with less salt or no salt, if possible. What foods are recommended? The items listed may not be a complete list. Talk with your dietitian about what dietary choices are best for you. Grains Whole-grain or whole-wheat bread. Whole-grain or whole-wheat pasta. Brown rice. [place]. Bulgur. Whole-grain and low-sodium cereals. Pita bread. Low-fat, low-sodium crackers. Whole-wheat flour tortillas. Vegetables Fresh or frozen vegetables (raw, steamed, roasted, or grilled). Low-sodium or reduced-sodium tomato and vegetable juice. Low-sodium or reduced-sodium tomato sauce and tomato paste. Low-sodium or reduced-sodium canned vegetables. Fruits All fresh, dried, or frozen fruit. Canned fruit in natural juice (without added sugar). Meat and other protein foods Skinless chicken or [place]. Ground chicken or [place]. Pork with fat trimmed off. Fish and seafood. Egg whites. Dried beans, peas, or lentils. Unsalted nuts, nut butters, and seeds. Unsalted canned beans. Lean cuts of beef with fat trimmed off. Low-sodium, lean deli  meat. Dairy Low-fat (1%) or fat-free (skim) milk. Fat-free, low-fat, or reduced-fat cheeses. Nonfat, low-sodium ricotta or cottage cheese. Low-fat or nonfat yogurt. Low-fat, low-sodium cheese. Fats and oils Soft margarine without trans fats. Vegetable oil. Low-fat, reduced-fat, or light mayonnaise and salad dressings (reduced-sodium). Canola, safflower, olive, soybean, and sunflower oils. Avocado. Seasoning and other foods Herbs. Spices. Seasoning mixes without salt. Unsalted popcorn and pretzels. Fat-free sweets. What foods are not recommended? The items listed may not be a complete list. Talk with your dietitian about what dietary choices are best for you. Grains Baked goods made with fat, such as croissants, muffins, or some breads. Dry pasta or rice meal packs. Vegetables Creamed or fried vegetables. Vegetables in a cheese sauce. Regular canned vegetables (not low-sodium or reduced-sodium). Regular canned tomato sauce and paste (not low-sodium or reduced-sodium). Regular tomato and vegetable juice (not low-sodium or reduced-sodium). Angie Fava. Olives. Fruits Canned fruit in a light or heavy syrup. Fried fruit. Fruit in cream or butter sauce. Meat and other protein foods Fatty cuts of meat. Ribs. Fried meat. Berniece Salines. Sausage. Bologna and other processed lunch  meats. Salami. Fatback. Hotdogs. Bratwurst. Salted nuts and seeds. Canned beans with added salt. Canned or smoked fish. Whole eggs or egg yolks. Chicken or [place] with skin. Dairy Whole or 2% milk, cream, and half-and-half. Whole or full-fat cream cheese. Whole-fat or sweetened yogurt. Full-fat cheese. Nondairy creamers. Whipped toppings. Processed cheese and cheese spreads. Fats and oils Butter. Stick margarine. Lard. Shortening. Ghee. Bacon fat. Tropical oils, such as coconut, palm kernel, or palm oil. Seasoning and other foods Salted popcorn and pretzels. Onion salt, garlic salt, seasoned salt, table salt, and sea salt. Worcestershire  sauce. Tartar sauce. Barbecue sauce. Teriyaki sauce. Soy sauce, including reduced-sodium. Steak sauce. Canned and packaged gravies. Fish sauce. Oyster sauce. Cocktail sauce. Horseradish that you find on the shelf. Ketchup. Mustard. Meat flavorings and tenderizers. Bouillon cubes. Hot sauce and Tabasco sauce. Premade or packaged marinades. Premade or packaged taco seasonings. Relishes. Regular salad dressings. Where to find more information:  National Heart, Lung, and [place]: https://wilson-eaton.com/  American Heart Association: www.heart.org Summary  The DASH eating plan is a healthy eating plan that has been shown to reduce high blood pressure (hypertension). It may also reduce your risk for type 2 diabetes, heart disease, and stroke.  With the DASH eating plan, you should limit salt (sodium) intake to 2,300 mg a day. If you have hypertension, you may need to reduce your sodium intake to 1,500 mg a day.  When on the DASH eating plan, aim to eat more fresh fruits and vegetables, whole grains, lean proteins, low-fat dairy, and heart-healthy fats.  Work with your health care provider or diet and nutrition specialist (dietitian) to adjust your eating plan to your individual calorie needs. This information is not intended to replace advice given to you by your health care provider. Make sure you discuss any questions you have with your health care provider. Document Released: 02/14/2011 Document Revised: 02/19/2016 Document Reviewed: 02/19/2016 Elsevier Interactive Patient Education  2019 [employer].

## 2018-04-22 NOTE — Progress Notes (Signed)
Cardiology Office Note:    Date:  04/22/2018   ID:  Gregory Hansen, DOB 1950/07/11, MRN [id number]  PCP:  [name], DO  Cardiologist:  [name], MD  Referring MD: [name], DO   Chief Complaint  Patient presents with  . Follow-up    CAD    History of Present Illness:    Gregory Hansen is a 68 y.o. male with a past medical history significant for CAD s/p multiple PCI and then CABG 2015, HTN, HLD, GERD, arthritis and anxiety. He underwent 4 vessel CABG in 2015 with LIMA to LAD, vein graft obtuse marginal, and sequential vein graft to the PDA and PLA branches of the RCA. He had an uncomplicated postoperative course.  He was hospitalized 03/30/18 for RLE cellulitis and sepsis, improved with antibiotics. Suspected venous insufficiency of right leg with hx of vein harvesting for CABG.   He is here alone for follow up alone. His RLE is still red but he reports swelling is way down, close to normal. Overall he says is much better. He is still on antibiotics, 2 more weeks. He has had no chest discomfort or shortness of breath. He gets a little lightheadedness when up walking but it lasts only a few seconds and he has not fallen.  He has had no chest discomfort, shortness of breath, palpitations, orthopnea or PND.   Had Bmet per PCP yesterday per primary care. SCr was mildly elevated at 1.57. His ramipril was put on hold with plan to recheck labs on 2/17.   Past Medical History:  Diagnosis Date  . Anginal pain ([place])   . Anxiety   . Arthritis    SPINE   . Arthritis   . Coronary artery disease    s/p multiple percutaneous interventions, PCI 202 for DMI and DES distal RCA and CFX-OM 2006  . Coronary artery disease   . Depression   . GERD (gastroesophageal reflux disease)   . Hyperlipidemia    mixed  . Hyperlipidemia   . Hypertension   . MI (myocardial infarction) ([place])   . Myocardial infarction ([place])   . Peripheral neuropathy    calves and both feet  . Peripheral  neuropathy    calves and both feet  . Spondylitis, ankylosing ([place])     Past Surgical History:  Procedure Laterality Date  . CARDIAC CATHETERIZATION     stent RCA June3, 2002, Stent OM/PTCA circumflex August 13, 2000  . CORONARY ANGIOPLASTY WITH STENT PLACEMENT     first one in 2006; had another place 3 months ago (August 2013)  . CORONARY ANGIOPLASTY WITH STENT PLACEMENT  03/26/2013   RCA           DR COOPER  . CORONARY ANGIOPLASTY WITH STENT PLACEMENT    . CORONARY ARTERY BYPASS GRAFT N/A 10/29/2013   Procedure: CORONARY ARTERY BYPASS GRAFTING x 4 (LIMA-LAD, SVG-OM, SVG-PD-PL) ENDOSCOPIC VEIN HARVEST RIGHT THIGH;  Surgeon: Gaye Pollack, MD;  Location: [facility] OR;  Service: Open Heart Surgery;  Laterality: N/A;  . CORONARY ARTERY BYPASS GRAFT    . FINGER SURGERY     5  TH     DIGIT RIGHT HAND  . FINGER SURGERY    . INTRAOPERATIVE TRANSESOPHAGEAL ECHOCARDIOGRAM N/A 10/29/2013   Procedure: INTRAOPERATIVE TRANSESOPHAGEAL ECHOCARDIOGRAM;  Surgeon: Gaye Pollack, MD;  Location: [facility] OR;  Service: Open Heart Surgery;  Laterality: N/A;  . KNEE ARTHROSCOPY W/ LASER  2003  . LEFT HEART CATHETERIZATION WITH CORONARY ANGIOGRAM N/A 10/01/2011  Procedure: LEFT HEART CATHETERIZATION WITH CORONARY ANGIOGRAM;  Surgeon: [name], MD;  Location: [facility] CATH LAB;  Service: Cardiovascular;  Laterality: N/A;  . LEFT HEART CATHETERIZATION WITH CORONARY ANGIOGRAM N/A 03/26/2013   Procedure: LEFT HEART CATHETERIZATION WITH CORONARY ANGIOGRAM;  Surgeon: Blane Ohara, MD;  Location: [facility] CATH LAB;  Service: Cardiovascular;  Laterality: N/A;  . LEFT HEART CATHETERIZATION WITH CORONARY ANGIOGRAM N/A 10/25/2013   Procedure: LEFT HEART CATHETERIZATION WITH CORONARY ANGIOGRAM;  Surgeon: Blane Ohara, MD;  Location: [facility] CATH LAB;  Service: Cardiovascular;  Laterality: N/A;    Current Medications: Current Meds  Medication Sig  . amLODipine (NORVASC) 10 MG tablet Take 1 tablet (10 mg total) by mouth daily.  Marland Kitchen aspirin  81 MG tablet Take 81 mg by mouth every evening.   Marland Kitchen atorvastatin (LIPITOR) 80 MG tablet Take 80 mg by mouth daily.  Marland Kitchen buPROPion (WELLBUTRIN XL) 300 MG 24 hr tablet Take 300 mg by mouth daily.  Marland Kitchen doxycycline (VIBRA-TABS) 100 MG tablet Take 1 tablet (100 mg total) by mouth every 12 (twelve) hours.  . fluticasone (FLONASE) 50 MCG/ACT nasal spray Place 2 sprays into both nostrils every evening.   Marland Kitchen ibuprofen (ADVIL,MOTRIN) 200 MG tablet Take 400 mg by mouth at bedtime.   Marland Kitchen loratadine (CLARITIN) 10 MG tablet Take 10 mg by mouth daily.  . metoprolol succinate (TOPROL-XL) 50 MG 24 hr tablet Take 1 tablet (50 mg total) by mouth daily.  . nefazodone (SERZONE) 200 MG tablet Take 200 mg by mouth 2 (two) times daily.   . nitroGLYCERIN (NITROSTAT) 0.4 MG SL tablet Place 0.4 mg under the tongue every 5 (five) minutes as needed for chest pain.  . pantoprazole (PROTONIX) 40 MG tablet Take 1 tablet (40 mg total) by mouth daily.  . ramipril (ALTACE) 10 MG capsule Take 1 capsule (10 mg total) by mouth 2 (two) times daily.  . vitamin B-12 (CYANOCOBALAMIN) 1000 MCG tablet Take 1,000 mcg by mouth daily.     Allergies:   Patient has no known allergies.   Social History   Socioeconomic History  . Marital status: Divorced    Spouse name: n/a  . Number of children: 2  . Years of education: Masters degree  . Highest education level: Master's degree (e.g., MA, MS, MEng, MEd, MSW, MBA)  Occupational History  . 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 Financial resource strain: Not hard at all  . Food insecurity:    Worry: Never true    Inability: Never true  . Transportation needs:    Medical: No    Non-medical: No  Tobacco Use  . Smoking status: Former Smoker    Packs/day: 2.00    Years: 10.00    Pack years: 20.00    Types: Cigarettes    Last attempt to quit: 06/30/1979    Years since quitting: 38.8  . Smokeless tobacco: Never Used  . Tobacco comment: no plans to  start back  Substance and Sexual Activity  . Alcohol use: Yes    Alcohol/week: 16.0 standard drinks    Types: 14 Glasses of wine, 2 Shots of liquor per week    Comment: 3-4 glasses of wine at dinner everyday.  . Drug use: No  . Sexual activity: Not Currently    Birth control/protection: Condom  Lifestyle  . Physical activity:    Days per week: 0 days    Minutes per session: 0 min  . Stress: Not at all  Relationships  . Social connections:    Talks on phone: Three times a week    Gets together: Once a week    Attends religious service: Never    Active member of club or organization: No    Attends meetings of clubs or organizations: Never    Relationship status: Divorced  Other Topics Concern  . Not on file  Social History Narrative   ** Merged History Encounter **       Lives in a one level home with son, and dog, Scientist, physiological. Works out of the house. 2 steps into house. Smoke alarms in house. No grab bars in bathroom. Has some area rugs and throw rugs with backing.      No pets of his own. Restores antique pens, does like to walk and cycle but having problems with peripheral neuropathy.       Firearm is safely stored.      Wears seat belts in vehicle. Does use sunblock and tries to avoid sun due to vitiligo and previous sunburns.      Eats varied diet. Likes protein, fresh fruits and veggies. Watches fats, saturated fats. Drink a lot of water.      Family History: The patient's family history includes Breast cancer (age of onset: 69) in his mother; Heart attack in his brother; Heart disease in his brother, father, maternal grandfather, and another family member; Leukemia in his maternal grandmother. ROS:   Please see the history of present illness.     All other systems reviewed and are negative.  EKGs/Labs/Other Studies Reviewed:    The following studies were reviewed today: None since his CABG  EKG:  EKG is not ordered today.    Recent Labs: 04/05/2018: Magnesium  1.6 04/20/2018: ALT 24; BUN 14; Creatinine, Ser 1.57; Hemoglobin 12.0; Platelets 210; Potassium 4.0; Sodium 133   Recent Lipid Panel    Component Value Date/Time   CHOL 148 11/04/2017 1636   TRIG 71 11/04/2017 1636   HDL 52 11/04/2017 1636   CHOLHDL 2.8 11/04/2017 1636   CHOLHDL 2.8 05/25/2015 0937   VLDL 29 05/25/2015 0937   LDLCALC 82 11/04/2017 1636   LDLDIRECT 89.7 08/01/2011 0921    Physical Exam:    VS:  BP 120/72   Pulse 72   Ht 6\' 2"  (1.88 m)   Wt 232 lb 12.8 oz (105.6 kg)   SpO2 97%   BMI 29.89 kg/m     Wt Readings from Last 3 Encounters:  04/22/18 232 lb 12.8 oz (105.6 kg)  04/20/18 234 lb 8 oz (106.4 kg)  03/31/18 246 lb 7.6 oz (111.8 kg)     Physical Exam  Constitutional: He is oriented to person, place, and time. He appears well-developed and well-nourished. No distress.  HENT:  Head: Normocephalic and atraumatic.  Neck: Normal range of motion. Neck supple. No JVD present.  Cardiovascular: Normal rate, regular rhythm, normal heart sounds and intact distal pulses. Exam reveals no gallop and no friction rub.  No murmur heard. Pulmonary/Chest: Effort normal and breath sounds normal. No respiratory distress. He has no wheezes. He has no rales.  Abdominal: Soft. Bowel sounds are normal.  Musculoskeletal: Normal range of motion.        General: Edema present.     Comments: Right lower leg with redness and trace-1+ edema  Neurological: He is alert and oriented to person, place, and time.  Skin: Skin is warm and dry.  Psychiatric: He has a normal mood and affect. His behavior  is normal. Judgment and thought content normal.  Vitals reviewed.    ASSESSMENT:    1. Coronary artery disease involving native coronary artery of native heart without angina pectoris   2. HYPERTENSION, BENIGN   3. Hyperlipidemia with target LDL less than 70   4. Cellulitis of right lower extremity    PLAN:    In order of problems listed above:  CAD: s/p 4V CABG 10/2013. On  aspirin, BB, high intensity statin. ACE-I on hold by PCP due to renal function. Labs are to be rechecked on 2/17. I'm OK with keeping of ACE or reducing dose if needed. He's been having no anginal symptoms.   Hypertension: Managed on Toprol, ramipril-which is currently on hold due to elevated Scr per primary care. BP currently well controlled. He just got a BP cuff and will monitor daily at different times of the day. Target BP <130/80.  -DASH diet, low sodium.   Hyperlipidemia: atorvastatin 80 mg. Last lipid profile in 11/04/2017 showed LDL 82, goal of <70. Will refer to lipid clinic for consideration of PCSK9-I.   Recent RLE cellulitis and sepsis -Improving, still on antibiotics.   Peripheral neuropathy: Was treated at the [place] with some improvement in symptoms.    Medication Adjustments/Labs and Tests Ordered: Current medicines are reviewed at length with the patient today.  Concerns regarding medicines are outlined above. Labs and tests ordered and medication changes are outlined in the patient instructions below:  Patient Instructions  Medication Instructions:  Your physician recommends that you continue on your current medications as directed. Please refer to the Current Medication list given to you today.  If you need a refill on your cardiac medications before your next appointment, please call your pharmacy.   Lab work: None  If you have labs (blood work) drawn today and your tests are completely normal, you will receive your results only by: Marland Kitchen MyChart Message (if you have MyChart) OR . A paper copy in the mail If you have any lab test that is abnormal or we need to change your treatment, we will call you to review the results.  Testing/Procedures: None   Follow-Up: At [facility], you and your health needs are our priority.  As part of our continuing mission to provide you with exceptional heart care, we have created designated Provider  Care Teams.  These Care Teams include your primary Cardiologist (physician) and Advanced Practice Providers (APPs -  Physician Assistants and Nurse Practitioners) who all work together to provide you with the care you need, when you need it. You will need a follow up appointment in:  6 months.  Please call our office 2 months in advance to schedule this appointment.  You may see [name], MD or one of the following Advanced Practice Providers on your designated Care Team: Richardson Dopp, PA-C [place], [place] . Daune Perch, NP  You have been referred to our Lipid clinic   Any Other Special Instructions Will Be Listed Below (If Applicable).  Check blood pressure a couple of times per week, at different times of the day.  Goal blood pressure <130/80.    DASH Eating Plan DASH stands for "Dietary Approaches to Stop Hypertension." The DASH eating plan is a healthy eating plan that has been shown to reduce high blood pressure (hypertension). It may also reduce your risk for type 2 diabetes, heart disease, and stroke. The DASH eating plan may also help with weight loss. What are tips for  following this plan?  General guidelines  Avoid eating more than 2,300 mg (milligrams) of salt (sodium) a day. If you have hypertension, you may need to reduce your sodium intake to 1,500 mg a day.  Limit alcohol intake to no more than 1 drink a day for nonpregnant women and 2 drinks a day for men. One drink equals 12 oz of beer, 5 oz of wine, or 1 oz of hard liquor.  Work with your health care provider to maintain a healthy body weight or to lose weight. Ask what an ideal weight is for you.  Get at least 30 minutes of exercise that causes your heart to beat faster (aerobic exercise) most days of the week. Activities may include walking, swimming, or biking.  Work with your health care provider or diet and nutrition specialist (dietitian) to adjust your eating plan to your individual calorie  needs. Reading food labels   Check food labels for the amount of sodium per serving. Choose foods with less than 5 percent of the Daily Value of sodium. Generally, foods with less than 300 mg of sodium per serving fit into this eating plan.  To find whole grains, look for the word "whole" as the first word in the ingredient list. Shopping  Buy products labeled as "low-sodium" or "no salt added."  Buy fresh foods. Avoid canned foods and premade or frozen meals. Cooking  Avoid adding salt when cooking. Use salt-free seasonings or herbs instead of table salt or sea salt. Check with your health care provider or pharmacist before using salt substitutes.  Do not fry foods. Cook foods using healthy methods such as baking, boiling, grilling, and broiling instead.  Cook with heart-healthy oils, such as olive, canola, soybean, or sunflower oil. Meal planning  Eat a balanced diet that includes: ? 5 or more servings of fruits and vegetables each day. At each meal, try to fill half of your plate with fruits and vegetables. ? Up to 6-8 servings of whole grains each day. ? Less than 6 oz of lean meat, poultry, or fish each day. A 3-oz serving of meat is about the same size as a deck of cards. One egg equals 1 oz. ? 2 servings of low-fat dairy each day. ? A serving of nuts, seeds, or beans 5 times each week. ? Heart-healthy fats. Healthy fats called Omega-3 fatty acids are found in foods such as flaxseeds and coldwater fish, like sardines, salmon, and mackerel.  Limit how much you eat of the following: ? Canned or prepackaged foods. ? Food that is high in trans fat, such as fried foods. ? Food that is high in saturated fat, such as fatty meat. ? Sweets, desserts, sugary drinks, and other foods with added sugar. ? Full-fat dairy products.  Do not salt foods before eating.  Try to eat at least 2 vegetarian meals each week.  Eat more home-cooked food and less restaurant, buffet, and fast  food.  When eating at a restaurant, ask that your food be prepared with less salt or no salt, if possible. What foods are recommended? The items listed may not be a complete list. Talk with your dietitian about what dietary choices are best for you. Grains Whole-grain or whole-wheat bread. Whole-grain or whole-wheat pasta. Brown rice. [place]. Bulgur. Whole-grain and low-sodium cereals. Pita bread. Low-fat, low-sodium crackers. Whole-wheat flour tortillas. Vegetables Fresh or frozen vegetables (raw, steamed, roasted, or grilled). Low-sodium or reduced-sodium tomato and vegetable juice. Low-sodium or reduced-sodium tomato sauce and tomato  paste. Low-sodium or reduced-sodium canned vegetables. Fruits All fresh, dried, or frozen fruit. Canned fruit in natural juice (without added sugar). Meat and other protein foods Skinless chicken or [place]. Ground chicken or [place]. Pork with fat trimmed off. Fish and seafood. Egg whites. Dried beans, peas, or lentils. Unsalted nuts, nut butters, and seeds. Unsalted canned beans. Lean cuts of beef with fat trimmed off. Low-sodium, lean deli meat. Dairy Low-fat (1%) or fat-free (skim) milk. Fat-free, low-fat, or reduced-fat cheeses. Nonfat, low-sodium ricotta or cottage cheese. Low-fat or nonfat yogurt. Low-fat, low-sodium cheese. Fats and oils Soft margarine without trans fats. Vegetable oil. Low-fat, reduced-fat, or light mayonnaise and salad dressings (reduced-sodium). Canola, safflower, olive, soybean, and sunflower oils. Avocado. Seasoning and other foods Herbs. Spices. Seasoning mixes without salt. Unsalted popcorn and pretzels. Fat-free sweets. What foods are not recommended? The items listed may not be a complete list. Talk with your dietitian about what dietary choices are best for you. Grains Baked goods made with fat, such as croissants, muffins, or some breads. Dry pasta or rice meal packs. Vegetables Creamed or fried vegetables. Vegetables  in a cheese sauce. Regular canned vegetables (not low-sodium or reduced-sodium). Regular canned tomato sauce and paste (not low-sodium or reduced-sodium). Regular tomato and vegetable juice (not low-sodium or reduced-sodium). Angie Fava. Olives. Fruits Canned fruit in a light or heavy syrup. Fried fruit. Fruit in cream or butter sauce. Meat and other protein foods Fatty cuts of meat. Ribs. Fried meat. [place] Salines. Sausage. Bologna and other processed lunch meats. Salami. Fatback. Hotdogs. Bratwurst. Salted nuts and seeds. Canned beans with added salt. Canned or smoked fish. Whole eggs or egg yolks. Chicken or [place] with skin. Dairy Whole or 2% milk, cream, and half-and-half. Whole or full-fat cream cheese. Whole-fat or sweetened yogurt. Full-fat cheese. Nondairy creamers. Whipped toppings. Processed cheese and cheese spreads. Fats and oils Butter. Stick margarine. Lard. Shortening. Ghee. Bacon fat. Tropical oils, such as coconut, palm kernel, or palm oil. Seasoning and other foods Salted popcorn and pretzels. Onion salt, garlic salt, seasoned salt, table salt, and sea salt. Worcestershire sauce. Tartar sauce. Barbecue sauce. Teriyaki sauce. Soy sauce, including reduced-sodium. Steak sauce. Canned and packaged gravies. Fish sauce. Oyster sauce. Cocktail sauce. Horseradish that you find on the shelf. Ketchup. Mustard. Meat flavorings and tenderizers. Bouillon cubes. Hot sauce and Tabasco sauce. Premade or packaged marinades. Premade or packaged taco seasonings. Relishes. Regular salad dressings. Where to find more information:  National Heart, Lung, and [place]: https://wilson-eaton.com/  American Heart Association: www.heart.org Summary  The DASH eating plan is a healthy eating plan that has been shown to reduce high blood pressure (hypertension). It may also reduce your risk for type 2 diabetes, heart disease, and stroke.  With the DASH eating plan, you should limit salt (sodium) intake to 2,300 mg a  day. If you have hypertension, you may need to reduce your sodium intake to 1,500 mg a day.  When on the DASH eating plan, aim to eat more fresh fruits and vegetables, whole grains, lean proteins, low-fat dairy, and heart-healthy fats.  Work with your health care provider or diet and nutrition specialist (dietitian) to adjust your eating plan to your individual calorie needs. This information is not intended to replace advice given to you by your health care provider. Make sure you discuss any questions you have with your health care provider. Document Released: 02/14/2011 Document Revised: 02/19/2016 Document Reviewed: 02/19/2016 Elsevier Interactive Patient Education  2019 [place], Daune Perch, NP  04/22/2018 11:31 AM    [facility]

## 2018-04-27 ENCOUNTER — Other Ambulatory Visit: Payer: Medicare Other

## 2018-04-27 DIAGNOSIS — I10 Essential (primary) hypertension: Secondary | ICD-10-CM | POA: Diagnosis not present

## 2018-04-28 LAB — BASIC METABOLIC PANEL
BUN/Creatinine Ratio: 13 (ref 10–24)
BUN: 15 mg/dL (ref 8–27)
CALCIUM: 8.2 mg/dL — AB (ref 8.6–10.2)
CO2: 20 mmol/L (ref 20–29)
Chloride: 101 mmol/L (ref 96–106)
Creatinine, Ser: 1.12 mg/dL (ref 0.76–1.27)
GFR calc non Af Amer: 67 mL/min/{1.73_m2} (ref >59)
GFR, EST AFRICAN AMERICAN: 78 mL/min/{1.73_m2} (ref >59)
Glucose: 100 mg/dL — ABNORMAL HIGH (ref 65–99)
Potassium: 4 mmol/L (ref 3.5–5.2)
Sodium: 135 mmol/L (ref 134–144)

## 2018-05-04 ENCOUNTER — Ambulatory Visit (INDEPENDENT_AMBULATORY_CARE_PROVIDER_SITE_OTHER): Payer: Medicare Other | Admitting: Family Medicine

## 2018-05-04 ENCOUNTER — Other Ambulatory Visit: Payer: Self-pay

## 2018-05-04 ENCOUNTER — Encounter: Payer: Self-pay | Admitting: Family Medicine

## 2018-05-04 VITALS — BP 110/62 | HR 75 | Temp 97.9°F | Ht 74.0 in | Wt 248.6 lb

## 2018-05-04 DIAGNOSIS — R21 Rash and other nonspecific skin eruption: Secondary | ICD-10-CM

## 2018-05-04 MED ORDER — PREDNISONE 20 MG PO TABS: 20.0000 mg | ORAL_TABLET | Freq: Every day | ORAL | 0 refills | Status: DC

## 2018-05-04 NOTE — Progress Notes (Signed)
Date of Visit: 05/04/2018   HPI:  Patient presents to discuss rash on legs.  Has itchy red rash on bilateral legs. Recently admitted to the hospital for six days with severe cellulitis of RLE in late January. Discharged on 6 more day course of doxycycline, which he completed by the time of his follow up visit 2 weeks ago. Recently over the last several days has begun to have red bumps on both legs. Quite itchy and he scratches at them. No one else in the house has them. No new medications or foods. No sores in mouth or genital areas. No fevers. Also has areas on his stomach/torso that are itchy.  ROS: See HPI.  [place]: history of CABG x4, hyperlipidemia, depression, hypertension, GERD, CAD  PHYSICAL EXAM: BP 110/62   Pulse 75   Temp 97.9 F (36.6 C) (Oral)   Ht 6\' 2"  (1.88 m)   Wt 248 lb 9.6 oz (112.8 kg)   SpO2 97%   BMI 31.92 kg/m  Gen: no acute distress, pleasant, cooperative, well appearing overall Extremities: bilateral lower extremities with erythematous papular rash with excoriations, no signs of superinfection. Rash goes up to mid-shin bilaterally. Rash is on top of background chronic mild RLE erythema, improved per patient & long term friend. Trunk also with similar scant papules on anterior abdomen  ASSESSMENT/PLAN:  Rash Etiology unclear. Differential diagnosis includes viral eruption, drug reaction, contact dermatitis, or even possibly insect bites. Patient without systemic symptoms or recent exposures. Will do trial of prednisone 20mg  daily for 1 week to control pruritis, as area too large to reasonably treat with topical steroid. If rash worsens or persists after prednisone will have patient return for another visit, at that point would recommend punch biopsy to further characterize. Patient agreeable to this plan.  FOLLOW UP: Follow up as needed if symptoms worsen or do not improve.   [place]. Ardelia Mems, [place]

## 2018-05-04 NOTE — Patient Instructions (Signed)
Take prednisone 20mg  daily for 1 week Follow up if not improving after that, may need biopsy  Be well, Dr. Ardelia Mems

## 2018-05-12 ENCOUNTER — Ambulatory Visit: Payer: Self-pay

## 2018-05-14 ENCOUNTER — Ambulatory Visit: Payer: Self-pay

## 2018-05-19 ENCOUNTER — Ambulatory Visit (INDEPENDENT_AMBULATORY_CARE_PROVIDER_SITE_OTHER): Payer: Medicare Other | Admitting: Family Medicine

## 2018-05-19 ENCOUNTER — Encounter: Payer: Self-pay | Admitting: Family Medicine

## 2018-05-19 ENCOUNTER — Other Ambulatory Visit: Payer: Self-pay

## 2018-05-19 VITALS — BP 160/66 | HR 87 | Temp 98.5°F | Wt 252.1 lb

## 2018-05-19 DIAGNOSIS — R21 Rash and other nonspecific skin eruption: Secondary | ICD-10-CM

## 2018-05-19 DIAGNOSIS — L03115 Cellulitis of right lower limb: Secondary | ICD-10-CM | POA: Diagnosis not present

## 2018-05-19 DIAGNOSIS — I10 Essential (primary) hypertension: Secondary | ICD-10-CM

## 2018-05-19 MED ORDER — AMLODIPINE BESYLATE 10 MG PO TABS: 10.0000 mg | ORAL_TABLET | Freq: Every day | ORAL | 2 refills | Status: DC

## 2018-05-19 MED ORDER — TRIAMCINOLONE ACETONIDE 0.1 % EX CREA: 1.0000 "application " | TOPICAL_CREAM | Freq: Every day | CUTANEOUS | 0 refills | Status: DC

## 2018-05-19 MED ORDER — AMOXICILLIN-POT CLAVULANATE 875-125 MG PO TABS: 1.0000 | ORAL_TABLET | Freq: Two times a day (BID) | ORAL | 0 refills | Status: DC

## 2018-05-19 MED ORDER — CLOTRIMAZOLE 1 % EX CREA: 1.0000 "application " | TOPICAL_CREAM | Freq: Two times a day (BID) | CUTANEOUS | 0 refills | Status: DC

## 2018-05-19 NOTE — Progress Notes (Signed)
Subjective:   Patient ID: Gregory Hansen    DOB: 10-05-1950, 68 y.o. male   MRN: [id number]  Gregory Hansen is a 68 y.o. male with a history of CABG x4, hyperlipidemia, depression, hypertension, GERD, CAD here for follow up on rash.  Rash: Was seen on 2/24 for rash on bilateral legs, abdomen, and upper arms. Received Prednisone 20mg  QD x 1 week. Noted rash got "less angry" but soon flared back up. Has been using Aveno and Aquaphor moisturizer several times per day. Helps with the itch for a short time. He endorses severe itchiness all over. Recently discontinued Ramipril and Amlodipine, Doxycycline, and ibuprofen. No new medications. Son lives in house with patient and does not have the rash. Has been using Aveno long term but just recently started on Aquaphor and does endorse worsening of rash since starting that. Has history of sensitive skin (allergy tested in past with sensitivity to certain preservatives) and PMH of vitiligo and "scleroderma" per patient. Notes rash started on legs and then spread all over. Denies any sores in mouth or genital areas. Denies fever.   H/o RLE Cellulitis: Patient was hospitalized for severe cellulitis on RLE in late January, discharged with remaining course of Doxycycline which he completed.  Today he notes worsening swelling and redness x ~1 week, redness seems to be spreading upward. Patient endorses it feel more warm to him as well. Denies any pain.   Hypertension: BP today: 160/66. Cardiologist recently discontinued Ramipril and Norvasc per patient. Denies any chest pain, SB, or vision changes.  Review of Systems:  Per HPI.   [place], medications and smoking status reviewed.  Objective:   BP (!) 160/66   Pulse 87   Temp 98.5 F (36.9 C) (Oral)   Wt 252 lb 2 oz (114.4 kg)   SpO2 98%   BMI 32.37 kg/m  Vitals and nursing note reviewed.  General: well nourished, well developed, in no acute distress with non-toxic appearance, sitting comfortably in  exam chair HEENT: normocephalic, atraumatic, moist mucous membranes, oropharynx clear without any noticeable lesions, exudate or erythema Neck: supple, normal ROM CV: regular rate and rhythm without murmurs, rubs, or gallops, 1+ LE pitting edema bilaterally, 2+ radial pulses bilaterally Lungs: clear to auscultation bilaterally with normal work of breathing Abdomen: soft, non-tender, non-distended, normoactive bowel sounds Skin: diffuse erythematous papular rash with scattered patches along LE (including thighs), abdomen, back, UE, and inguinal and axillary intertrignous folds with excoriations, rash superimposed on chronic RLE erythema and thickened skin that has worsened per patient. Increased warmth appreciated on RLE. Small pinpoint spots scattered throughout on UE and LE bilaterally, abdomen, and back. Larger scaly papules on UE's MSK: ROM grossly intact, strength intact, gait normal Neuro: Alert and oriented, speech normal                    Assessment & Plan:   HYPERTENSION, BENIGN  BP elevated during visit. Denied any CV symptoms.   Per patient was recently discontinued from Ramipril and Norvasc. Currently on Metoprolol.   Given rash and recent AKI, will continue to hold ACE  Will restart Norvasc 10mg  QD at this time with close follow up for BP recheck  Cellulitis  Recently discharged for sepsis 2/2 RLE cellulitis on 1/26, treated with Doxycycline   Per patient, worsening swelling and erythema of RLE   Increased warmth appreciated on exam.   Given suspicion for concurrent folliculitis, will treat both with Augmentin BID x 10 days.  RTC in 1-2 weeks for follow up  Rash and nonspecific skin eruption  Etiology unclear and may be multifactorial. Excoriations cloud the overall picture.   S/p Prednisone 20mg  x 1 week with minimal improvement  Areas on B/l thighs and UE appear most consistent with folliculitis. Will treat with Augmentin 875-125 BID x 10 days  and Triamcinolone 1% cream. Also recommended OTC Pramoxine for itch.  Larger scaly papules on UE's have characteristics of nummular eczema but difficult to know for sure without biopsy  Erythema within axillary and inguinal folds make tinea cruris on the differential. Will treat with Lotrimin 1% cream to affected area BID  Given his history of vitiligo, autoimmune etiology is also possible  Arthropod infection lower on differential given son at home does not have any rash  Viral exanthem possible but unlikely given no other signs or symptoms indicative of viral infection   Patient to call [facility] to set up appointment as he has been seen by Dr. Rolm Bookbinder back in 2014.    Patient to continue daily moisturizing with Aveno. Given h/o sensitivity to preservatives on allergy testing and reported worsening of rash since starting Aquaphor, recommended discontinuing at this time  If no improvement and no dermatology appointment established by next visit, recommend biopsy.   Meds ordered this encounter  Medications  . triamcinolone cream (KENALOG) 0.1 %    Sig: Apply 1 application topically daily.    Dispense:  80 g    Refill:  0  . clotrimazole (LOTRIMIN) 1 % cream    Sig: Apply 1 application topically 2 (two) times daily.    Dispense:  30 g    Refill:  0  . DISCONTD: amLODipine (NORVASC) 10 MG tablet    Sig: Take 1 tablet (10 mg total) by mouth daily.    Dispense:  90 tablet    Refill:  2  . amoxicillin-clavulanate (AUGMENTIN) 875-125 MG tablet    Sig: Take 1 tablet by mouth 2 (two) times daily.    Dispense:  20 tablet    Refill:  0  . amLODipine (NORVASC) 10 MG tablet    Sig: Take 1 tablet (10 mg total) by mouth daily.    Dispense:  90 tablet    Refill:  2   Gregory Marble, DO PGY-1, [facility] 05/23/2018 6:19 PM

## 2018-05-19 NOTE — Patient Instructions (Signed)
Thank you for coming to see me today. It was a pleasure. Today we talked about your rash. We have prescribed a steroid cream (Triamcinolone 1% cream to apply to areas that itch once a day), antifungal cream (Clotrimazole 1% cream) to be applied to groin area twice a day, and an antibiotic (Augmetin) to be taken twice a day for 10 days. Please return in 2 weeks for a follow up visit. Please be sure to call you dermatologist to make an appointment at your earliest convenience. We also recommend topical Pramoxine (anti-itch, over the counter) to use for itch.  We have also restarted your blood pressure medicine, Norvasc 10mg  to be taken once a day. We will recheck you blood pressure at your follow up visit.   Please follow-up with PCP in 2 weeks for follow up.  If you have any questions or concerns, please do not hesitate to call the office at [phone].  Take Care,  Dr. Mina Marble, DO Resident Physician [place] [phone]

## 2018-05-23 ENCOUNTER — Encounter: Payer: Self-pay | Admitting: Family Medicine

## 2018-05-23 DIAGNOSIS — R21 Rash and other nonspecific skin eruption: Secondary | ICD-10-CM | POA: Insufficient documentation

## 2018-05-23 NOTE — Assessment & Plan Note (Addendum)
   Etiology unclear and may be multifactorial. Excoriations cloud the overall picture.   S/p Prednisone 20mg  x 1 week with minimal improvement  Areas on B/l thighs and UE appear most consistent with folliculitis. Will treat with Augmentin 875-125 BID x 10 days and Triamcinolone 1% cream. Also recommended OTC Pramoxine for itch.  Larger scaly papules on UE's have characteristics of nummular eczema but difficult to know for sure without biopsy  Erythema within axillary and inguinal folds make tinea cruris on the differential. Will treat with Lotrimin 1% cream to affected area BID  Given his history of vitiligo, autoimmune etiology is also possible  Arthropod infection lower on differential given son at home does not have any rash  Viral exanthem possible but unlikely given no other signs or symptoms indicative of viral infection   Patient to call [facility] of [facility] to set up appointment as he has been seen by Dr. Rolm Bookbinder back in 2014.    Patient to continue daily moisturizing with Aveno. Given h/o sensitivity to preservatives on allergy testing and reported worsening of rash since starting Aquaphor, recommended discontinuing at this time  If no improvement and no dermatology appointment established by next visit, recommend biopsy.

## 2018-05-23 NOTE — Assessment & Plan Note (Addendum)
   BP elevated during visit. Denied any CV symptoms.   Per patient was recently discontinued from Ramipril and Norvasc. Currently on Metoprolol.   Given rash and recent AKI, will continue to hold ACE  Will restart Norvasc 10mg  QD at this time with close follow up for BP recheck

## 2018-05-23 NOTE — Assessment & Plan Note (Addendum)
   Recently discharged for sepsis 2/2 RLE cellulitis on 1/26, treated with Doxycycline   Per patient, worsening swelling and erythema of RLE   Increased warmth appreciated on exam.   Given suspicion for concurrent folliculitis, will treat both with Augmentin BID x 10 days.   RTC in 1-2 weeks for follow up

## 2018-05-28 ENCOUNTER — Telehealth: Payer: Self-pay | Admitting: Student in an Organized Health Care Education/Training Program

## 2018-05-28 NOTE — Telephone Encounter (Signed)
Due to ongoing concerns with social distancing in light of COVID-19 cases, I called this patient to offer options for their visit on 05/31/2017. Their visit was listed for follow-up of rash.   I offered to discuss their care via phone and prescribe medications and order labs as needed, in order to spare them potential exposure to the community by coming to our office. They elected to speak over the phone and potentially cancel a visit on 3/23.   HPI  Was previously seen on 3/10 by Dr. Tarry Kos for generalized rash.  There is appearance of folliculitis and so he was prescribed a course of Vantin.  He continues to take a steroid topical.  Additionally he has been using pramoxine as needed for itch.  He reports that since his previous visit his symptoms are significantly improved.  He reports that the rash is nearly gone.  He continues to have some itching, however the pramoxine improves this problem.  Is not any fevers.  He is still has 2 days of Augmentin left.  Patient advised to continue his antibiotic.  I will keep the appointment on my schedule for Monday as a reminder to give the patient a call.  He would prefer not to come into the office.  I counseled the patient that we are here for them during this trying time. I asked that they reach out via phone with any concerns regarding these problems or others that may come up. They were particularly counseled to call with concerns for cough, SOB, or fever.   Routing to Preceptor in accordance with our procedure. This is a no charge visit.   Everrett Coombe, MD

## 2018-05-31 IMAGING — DX DG CHEST 1V PORT
1 series · 1 of 1 positions shown · non-contrast
Comparison: Chest radiograph 12/01/2013

CLINICAL DATA: Diarrhea

EXAM:
PORTABLE CHEST 1 VIEW

[chest ap]
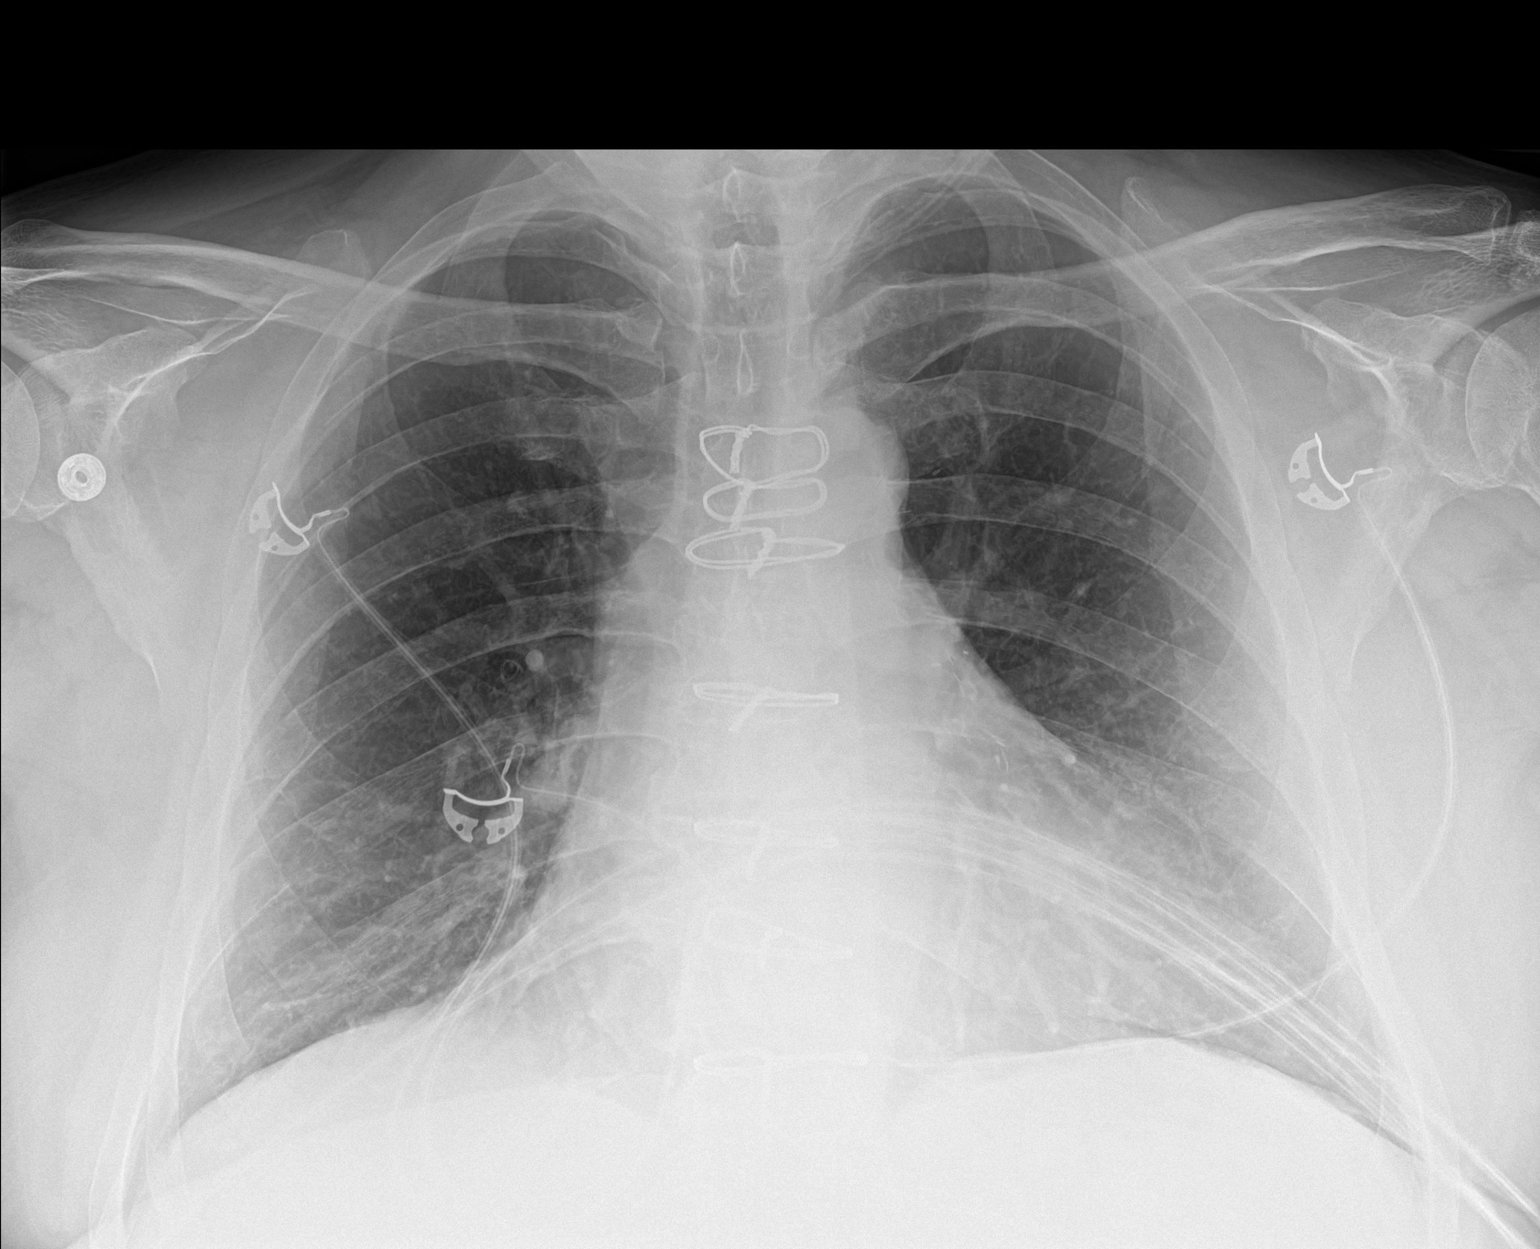

[1 of 1 positions shown; findings below may reference images not displayed]

FINDINGS: Unchanged cardiomegaly. Status post median sternotomy. No focal
airspace consolidation or pulmonary edema. No pneumothorax or
sizable pleural effusion.
IMPRESSION: Unchanged cardiomegaly without active cardiopulmonary disease.

## 2018-06-01 ENCOUNTER — Ambulatory Visit: Payer: Medicare Other | Admitting: Student in an Organized Health Care Education/Training Program

## 2018-06-10 ENCOUNTER — Telehealth: Payer: Self-pay

## 2018-06-10 NOTE — Telephone Encounter (Signed)
Pt LVM on nurse line stating his BP medication was never received by his pharmacy. Pt is requesting this to be resent. O called the pharmacy and they were unable to process due to not having NPI of DO. Information given and verbal given for Amlodipine 10mg  #90.

## 2018-08-04 ENCOUNTER — Other Ambulatory Visit: Payer: Self-pay | Admitting: Cardiology

## 2018-08-25 ENCOUNTER — Encounter: Payer: Self-pay | Admitting: Family Medicine

## 2018-09-21 ENCOUNTER — Other Ambulatory Visit: Payer: Self-pay

## 2018-09-21 NOTE — Patient Outreach (Signed)
[facility]) Care Management  09/21/2018  YASSEN KINNETT 12-Mar-1950 [id number]   Medication Adherence call to Mr. Kipper Buch patient did not answer patient is past due on Ramipril 10 mg and Atorvastatin 80 mg under [place].   [place] Management Direct Dial [phone]  Fax [phone] [email or web address]

## 2018-10-06 DIAGNOSIS — H2513 Age-related nuclear cataract, bilateral: Secondary | ICD-10-CM | POA: Diagnosis not present

## 2018-10-12 ENCOUNTER — Other Ambulatory Visit: Payer: Self-pay

## 2018-10-12 NOTE — Patient Outreach (Signed)
[place] [facility]) Care Management  10/12/2018  Gregory Hansen [birth year] [id number]   Medication Adherence call to [name] HIPPA Compliant Voice message left with a call back number. Gregory Hansen is showing past due on Ramipril 10 mg and Atorvastatin 80 mg under [place].   [place] Management Direct Dial [phone]  Fax [phone] [email or web address]

## 2018-10-16 ENCOUNTER — Telehealth: Payer: Self-pay | Admitting: Cardiovascular Disease

## 2018-10-16 ENCOUNTER — Other Ambulatory Visit: Payer: Self-pay

## 2018-10-16 NOTE — Patient Outreach (Signed)
[facility]) Care Management  10/16/2018  Gregory Hansen Feb 13, 1951 [id number]   Medication Adherence call back from  [name] [place] Identifiers Verify patient is showing past due on Atorvastatin 80 mg and Ramipril 10 mg patient explain he is no longer taking Ramipril doctor took him off and is no taking a different medication for blood pressure on Atorvastatin 80 mg patient has been taking 1/2 tablet instead of 1 tablet because he thought he was soppossed to be taking 1/2 of tablet instead of 1 tablet patient ask if we can call doctors office to Gregory Hansen a message at doctor office to clarify and to give the patient a call back.Gregory Hansen is showing past due under [place].   [place] Management Direct Dial [phone]  Fax [phone] [email or web address]

## 2018-10-16 NOTE — Telephone Encounter (Signed)
Pt should be taking lipitor 80 mg daily. When I saw him last his lipids were not to goal so I referred him to lipid clinic, which has not been done. If he was only taking 1/2 tablet that could be why not to goal. If he can tolerate it he should take the whole pill for his best benefit to reduce future cardiac risk.

## 2018-10-16 NOTE — Telephone Encounter (Signed)
Anna from [employer] called about atorvastatin (LIPITOR) 80 MG tablet, she stated patient as been taking 1/2 tablet.  She wants to clarify if that is what he is suppose to do.  Please call patient.

## 2018-10-16 NOTE — Telephone Encounter (Signed)
Spoke with pt, Pt agreed to start taking the full tablet of his lipitor.

## 2018-11-20 ENCOUNTER — Other Ambulatory Visit: Payer: Self-pay

## 2018-11-20 ENCOUNTER — Ambulatory Visit (INDEPENDENT_AMBULATORY_CARE_PROVIDER_SITE_OTHER): Payer: Medicare Other | Admitting: Family Medicine

## 2018-11-20 ENCOUNTER — Encounter: Payer: Self-pay | Admitting: Family Medicine

## 2018-11-20 VITALS — BP 154/60 | HR 69 | Ht 74.0 in | Wt 255.0 lb

## 2018-11-20 DIAGNOSIS — M7989 Other specified soft tissue disorders: Secondary | ICD-10-CM | POA: Diagnosis not present

## 2018-11-20 DIAGNOSIS — Z23 Encounter for immunization: Secondary | ICD-10-CM | POA: Diagnosis not present

## 2018-11-20 DIAGNOSIS — R252 Cramp and spasm: Secondary | ICD-10-CM | POA: Diagnosis not present

## 2018-11-20 DIAGNOSIS — I10 Essential (primary) hypertension: Secondary | ICD-10-CM

## 2018-11-20 MED ORDER — LOSARTAN POTASSIUM 50 MG PO TABS: 50.0000 mg | ORAL_TABLET | Freq: Every day | ORAL | 0 refills | Status: DC

## 2018-11-20 MED ORDER — ZOSTER VAC RECOMB ADJUVANTED 50 MCG/0.5ML IM SUSR: 0.5000 mL | Freq: Once | INTRAMUSCULAR | 0 refills | Status: AC

## 2018-11-20 NOTE — Progress Notes (Signed)
Subjective:   Patient ID: Gregory Hansen    DOB: 14-May-1950, 68 y.o. male   MRN: [id number]  Gregory Hansen is a 68 y.o. male with a history of CAD s/p CABG x4, h/o cryptosporidium diarrhea, GERD, ankylosing spondylitis, depression, h/o cellulitis, gynecomastia, HLD here for   R leg swelling Has noted R>L leg swelling for the past few months, worse when summer started. Also with redness and swelling but not acutely painful. Has neuropathy and reports chronic throbbing pain occasionally. He has had veins in his RLE harvested for CABG in 2015. Does not wrap his legs. Had admission for sepsis 2/2 cellulitis in 03/2018. Also with some trouble walking 2/2 legs aching that has been going on for the past year. Denies new rashes, fevers, warmth of RLE, chest pain, SOB. Pain relieved with sitting and elevating legs. He does not exercise on a daily basis.  Hypertension: - Medications: amlodipine 10mg , metoprolol 50mg  daily - Compliance: good - Checking BP at home: yes, 140-150s. - Denies any SOB, CP, medication SEs, or symptoms of hypotension .  Review of Systems:  Per HPI.  Medications and smoking status reviewed. Former smoker, 20 pack years.  Objective:   BP (!) 154/60    Pulse 69    Ht 6\' 2"  (1.88 m)    Wt 255 lb (115.7 kg)    SpO2 97%    BMI 32.74 kg/m  Vitals and nursing note reviewed.  General: overweight male, in no acute distress with non-toxic appearance CV: regular rate and rhythm without murmurs, rubs, or gallops, 2+ pitting lower extremity edema to knees Lungs: clear to auscultation bilaterally with normal work of breathing Skin: warm, dry. Erythema extending from R ankle to mid shin without warmth or tenderness to palpation.   Assessment & Plan:   HYPERTENSION, BENIGN Above goal. Previously on ACEI but discontinued at previous hospitalization. Norvasc could certainly be contributing to LE edema. Also would benefit from ACEI/ARB given h/o CAD, will d/c norvasc and start losartan.  Patient to call in one week with BP readings. Obtaining BMP today. F/u in one month.  Leg swelling Bilateral, R>L with associated redness. H/o cellulitis though is not experiencing acute pain or warmth, no fevers, doubt acute infection. Also considered DVT but duration and character of symptoms make less likely. With bilateral leg pain with walking and relief with elevation, could have claudication though typically wouldn't see swelling with this, ABIs 1.39 bilaterally today. Does have h/o vein harvesting for CABG and with addition of norvasc the past few months could be contributing to venous stasis picture with redness consistent with stasis dermatitis. Will refer to medical supply for graduated compression stockings and d/c norvasc today. Advised regular movement and exercise. If leg aches do not improve, recommend suggesting to cardiology to switch statin to one better tolerated such as rosuvastatin. Follow up in one month or sooner if develops worsening of swelling, redness, pain, fevers.  Healthcare Maintenance Provides record of negative FIT testing 10/08/18, will scan to chart. Due for shingles vaccine, Rx provided. Flu shot obtained today. UTD with pneumococcal and Tdap vaccinations.  Orders Placed This Encounter  Procedures   Flu Vaccine QUAD 36+ mos IM   Basic Metabolic Panel   POCT ABI Screening Pilot No Charge    This Order is for screening for the ABI Pilot.  It is a no charge exam.    Meds ordered this encounter  Medications   losartan (COZAAR) 50 MG tablet    Sig: Take 1  tablet (50 mg total) by mouth at bedtime.    Dispense:  90 tablet    Refill:  0   Zoster Vaccine Adjuvanted [facility]) injection    Sig: Inject 0.5 mLs into the muscle once for 1 dose.    Dispense:  0.5 mL    Refill:  0    Ok to give second dose at appropriate interval    Gregory Percy, DO PGY-3, [facility] 11/20/2018 2:56 PM

## 2018-11-20 NOTE — Assessment & Plan Note (Signed)
Above goal. Previously on ACEI but discontinued at previous hospitalization. Norvasc could certainly be contributing to LE edema. Also would benefit from ACEI/ARB given h/o CAD, will d/c norvasc and start losartan. Patient to call in one week with BP readings. Obtaining BMP today. F/u in one month.

## 2018-11-20 NOTE — Patient Instructions (Addendum)
It was great to see you!  Our plans for today:  - We are switching your blood pressure medication today. Check your blood pressure at home and call in one week with readings. - We obtained a vascular study of your legs today, it was normal. - Talk to your cardiologist about switching your atorvastatin to a different statin to maybe help with leg cramps. - We gave you a prescription for the shingles vaccines.  Take this to the pharmacy to get.  You will get a second dose 2-4 months after the first dose.  Some patients do have symptoms after getting this vaccine such as fatigue and muscle aches. - Take the prescription for the compression stockings to "[facility]" medical supply store. Call for an appointment [phone].   We are checking some labs today, we will call you or send you a letter if they are abnormal.   Take care and seek immediate care sooner if you develop any concerns.   Dr. [facility]

## 2018-11-20 NOTE — Assessment & Plan Note (Signed)
Bilateral, R>L with associated redness. H/o cellulitis though is not experiencing acute pain or warmth, no fevers, doubt acute infection. Also considered DVT but duration and character of symptoms make less likely. With bilateral leg pain with walking and relief with elevation, could have claudication though typically wouldn't see swelling with this, ABIs 1.39 bilaterally today. Does have h/o vein harvesting for CABG and with addition of norvasc the past few months could be contributing to venous stasis picture with redness consistent with stasis dermatitis. Will refer to medical supply for graduated compression stockings and d/c norvasc today. Advised regular movement and exercise. If leg aches do not improve, recommend suggesting to cardiology to switch statin to one better tolerated such as rosuvastatin. Follow up in one month or sooner if develops worsening of swelling, redness, pain, fevers.

## 2018-11-21 LAB — BASIC METABOLIC PANEL
BUN/Creatinine Ratio: 13 (ref 10–24)
BUN: 13 mg/dL (ref 8–27)
CO2: 22 mmol/L (ref 20–29)
Calcium: 8.6 mg/dL (ref 8.6–10.2)
Chloride: 102 mmol/L (ref 96–106)
Creatinine, Ser: 1.03 mg/dL (ref 0.76–1.27)
GFR calc Af Amer: 85 mL/min/{1.73_m2} (ref >59)
GFR calc non Af Amer: 74 mL/min/{1.73_m2} (ref >59)
Glucose: 105 mg/dL — ABNORMAL HIGH (ref 65–99)
Potassium: 3.3 mmol/L — ABNORMAL LOW (ref 3.5–5.2)
Sodium: 139 mmol/L (ref 134–144)

## 2018-12-08 ENCOUNTER — Other Ambulatory Visit: Payer: Self-pay | Admitting: Cardiology

## 2018-12-15 ENCOUNTER — Encounter: Payer: Self-pay | Admitting: Family Medicine

## 2018-12-22 ENCOUNTER — Other Ambulatory Visit: Payer: Self-pay

## 2018-12-22 ENCOUNTER — Encounter: Payer: Self-pay | Admitting: Family Medicine

## 2018-12-22 ENCOUNTER — Ambulatory Visit (INDEPENDENT_AMBULATORY_CARE_PROVIDER_SITE_OTHER): Payer: Medicare Other | Admitting: Family Medicine

## 2018-12-22 VITALS — BP 111/60 | HR 70 | Temp 98.8°F | Wt 248.0 lb

## 2018-12-22 DIAGNOSIS — R252 Cramp and spasm: Secondary | ICD-10-CM | POA: Diagnosis not present

## 2018-12-22 DIAGNOSIS — M7989 Other specified soft tissue disorders: Secondary | ICD-10-CM

## 2018-12-22 DIAGNOSIS — I10 Essential (primary) hypertension: Secondary | ICD-10-CM

## 2018-12-22 NOTE — Patient Instructions (Signed)
It was great to see you!  Our plans for today:  - I'm glad your swelling is better! - If you notice you leg cramping getting worse or if stretching doesn't relieve it, let us know and we can consider switching your statin. Make sure you are staying hydrated. Stretching often can help relieve cramps. - Follow up in 3 months.  We are checking some labs today, we will call you or send you a letter if they are abnormal.   Take care and seek immediate care sooner if you develop any concerns.   Dr. [facility]

## 2018-12-22 NOTE — Progress Notes (Signed)
  Subjective:   Patient ID: Gregory Hansen    DOB: [birth year], 68 y.o. male   MRN: [id number]  Gregory Hansen is a 68 y.o. male with a history of CAD s/p CABG x4, HTN, GERD, h/o Cryptosporidium diarrhea, ankylosing spondylitis, depression, HLD here for   Hypertension: - Medications: metoprolol 50mg  daily, losartan 50mg  daily (started at last visit) - Compliance: good - Checking BP at home: yes, 107-150s SBP - Denies any SOB, CP, vision changes, medication SEs, or symptoms of hypotension  Leg swelling -Seen previously 9/11 for bilateral R>L swelling with associated redness.  ABIs at that time 1.39 bilaterally.  Thought to be due to H/o vein harvesting for CABG with addition of Norvasc contributing to venous stasis with stasis dermatitis.  Prescribed graduated compression stocking at that time.  D/C'd Norvasc.  Recommended regular movement and exercise. - swelling is better since d/c norvasc. Did not get graduated compression stockings.  Leg cramping - few episodes of intermittent nocturnal leg cramping.  - relieves with stretching.  - tries to stay hydrated during the day.   Review of Systems:  Per HPI.  Medications and smoking status reviewed.  Objective:   BP 111/60   Pulse 70   Temp 98.8 F (37.1 C) (Oral)   Wt 248 lb (112.5 kg)   SpO2 95%   BMI 31.84 kg/m  Vitals and nursing note reviewed.  General: overweight male, in no acute distress with non-toxic appearance CV: regular rate and rhythm without murmurs, rubs, or gallops, no lower extremity edema Lungs: clear to auscultation bilaterally with normal work of breathing Skin: warm, dry. Chronic stasis dermatitis in RLE. Extremities: warm and well perfused, normal tone MSK: ROM grossly intact, gait normal  Assessment & Plan:   HYPERTENSION, BENIGN BP within goal range though with few low normal measurements, lowest 107. Asymptomatic. Will continue current regimen for now. Will obtain repeat BMP. F/u in 3 months.  Leg  swelling Improved since d/c of norvasc. No swelling on exam today, no need for compression stockings at this time. Continue to monitor.  Leg cramping Intermittent. Discussed switching statin to more tolerable such as rosuvastatin or pravastatin although patient is hesitant to make changes at this time.  Will focus on keeping well-hydrated and stretching.  Recommended yoga may be of some benefit.  Will also check electrolytes today.  Orders Placed This Encounter  Procedures  . Basic Metabolic Panel  . Magnesium   No orders of the defined types were placed in this encounter.   [name], DO PGY-3, [facility] 12/22/2018 5:45 PM

## 2018-12-22 NOTE — Assessment & Plan Note (Addendum)
Intermittent. Discussed switching statin to more tolerable such as rosuvastatin or pravastatin although patient is hesitant to make changes at this time.  Will focus on keeping well-hydrated and stretching.  Recommended yoga may be of some benefit.  Will also check electrolytes today.

## 2018-12-22 NOTE — Assessment & Plan Note (Signed)
BP within goal range though with few low normal measurements, lowest 107. Asymptomatic. Will continue current regimen for now. Will obtain repeat BMP. F/u in 3 months.

## 2018-12-22 NOTE — Assessment & Plan Note (Addendum)
Improved since d/c of norvasc. No swelling on exam today, no need for compression stockings at this time. Continue to monitor.

## 2018-12-23 LAB — BASIC METABOLIC PANEL
BUN/Creatinine Ratio: 11 (ref 10–24)
BUN: 11 mg/dL (ref 8–27)
CO2: 23 mmol/L (ref 20–29)
Calcium: 8.9 mg/dL (ref 8.6–10.2)
Chloride: 101 mmol/L (ref 96–106)
Creatinine, Ser: 1.04 mg/dL (ref 0.76–1.27)
GFR calc Af Amer: 84 mL/min/{1.73_m2} (ref >59)
GFR calc non Af Amer: 73 mL/min/{1.73_m2} (ref >59)
Glucose: 103 mg/dL — ABNORMAL HIGH (ref 65–99)
Potassium: 3.6 mmol/L (ref 3.5–5.2)
Sodium: 138 mmol/L (ref 134–144)

## 2018-12-23 LAB — MAGNESIUM: Magnesium: 1.3 mg/dL — ABNORMAL LOW (ref 1.6–2.3)

## 2019-01-08 ENCOUNTER — Other Ambulatory Visit: Payer: Self-pay | Admitting: Family Medicine

## 2019-01-13 ENCOUNTER — Other Ambulatory Visit: Payer: Self-pay | Admitting: Family Medicine

## 2019-01-26 ENCOUNTER — Emergency Department (HOSPITAL_COMMUNITY): Payer: Medicare Other

## 2019-01-26 ENCOUNTER — Inpatient Hospital Stay (HOSPITAL_COMMUNITY): Payer: Medicare Other

## 2019-01-26 ENCOUNTER — Other Ambulatory Visit: Payer: Self-pay

## 2019-01-26 ENCOUNTER — Inpatient Hospital Stay (HOSPITAL_COMMUNITY)
Admission: EM | Admit: 2019-01-26 | Discharge: 2019-02-08 | DRG: 551 | Disposition: A | Discharge status: Skilled Nursing Facility | Payer: Medicare Other | Attending: Family Medicine | Admitting: Family Medicine

## 2019-01-26 DIAGNOSIS — Z03818 Encounter for observation for suspected exposure to other biological agents ruled out: Secondary | ICD-10-CM | POA: Diagnosis not present

## 2019-01-26 DIAGNOSIS — L03115 Cellulitis of right lower limb: Secondary | ICD-10-CM | POA: Diagnosis not present

## 2019-01-26 DIAGNOSIS — R600 Localized edema: Secondary | ICD-10-CM | POA: Diagnosis not present

## 2019-01-26 DIAGNOSIS — L03116 Cellulitis of left lower limb: Secondary | ICD-10-CM | POA: Diagnosis not present

## 2019-01-26 DIAGNOSIS — R945 Abnormal results of liver function studies: Secondary | ICD-10-CM | POA: Diagnosis not present

## 2019-01-26 DIAGNOSIS — E871 Hypo-osmolality and hyponatremia: Secondary | ICD-10-CM | POA: Diagnosis present

## 2019-01-26 DIAGNOSIS — D696 Thrombocytopenia, unspecified: Secondary | ICD-10-CM | POA: Diagnosis not present

## 2019-01-26 DIAGNOSIS — Y9301 Activity, walking, marching and hiking: Secondary | ICD-10-CM | POA: Diagnosis present

## 2019-01-26 DIAGNOSIS — G629 Polyneuropathy, unspecified: Secondary | ICD-10-CM | POA: Diagnosis not present

## 2019-01-26 DIAGNOSIS — R609 Edema, unspecified: Secondary | ICD-10-CM | POA: Diagnosis not present

## 2019-01-26 DIAGNOSIS — I252 Old myocardial infarction: Secondary | ICD-10-CM

## 2019-01-26 DIAGNOSIS — D689 Coagulation defect, unspecified: Secondary | ICD-10-CM | POA: Diagnosis not present

## 2019-01-26 DIAGNOSIS — S32010A Wedge compression fracture of first lumbar vertebra, initial encounter for closed fracture: Secondary | ICD-10-CM | POA: Diagnosis not present

## 2019-01-26 DIAGNOSIS — F1010 Alcohol abuse, uncomplicated: Secondary | ICD-10-CM | POA: Diagnosis present

## 2019-01-26 DIAGNOSIS — Z743 Need for continuous supervision: Secondary | ICD-10-CM | POA: Diagnosis not present

## 2019-01-26 DIAGNOSIS — K219 Gastro-esophageal reflux disease without esophagitis: Secondary | ICD-10-CM | POA: Diagnosis not present

## 2019-01-26 DIAGNOSIS — I2510 Atherosclerotic heart disease of native coronary artery without angina pectoris: Secondary | ICD-10-CM | POA: Diagnosis not present

## 2019-01-26 DIAGNOSIS — E785 Hyperlipidemia, unspecified: Secondary | ICD-10-CM | POA: Diagnosis present

## 2019-01-26 DIAGNOSIS — Y92015 Private garage of single-family (private) house as the place of occurrence of the external cause: Secondary | ICD-10-CM

## 2019-01-26 DIAGNOSIS — M545 Low back pain: Secondary | ICD-10-CM | POA: Diagnosis not present

## 2019-01-26 DIAGNOSIS — B179 Acute viral hepatitis, unspecified: Secondary | ICD-10-CM

## 2019-01-26 DIAGNOSIS — M1990 Unspecified osteoarthritis, unspecified site: Secondary | ICD-10-CM | POA: Diagnosis present

## 2019-01-26 DIAGNOSIS — Y92009 Unspecified place in unspecified non-institutional (private) residence as the place of occurrence of the external cause: Secondary | ICD-10-CM

## 2019-01-26 DIAGNOSIS — K760 Fatty (change of) liver, not elsewhere classified: Secondary | ICD-10-CM | POA: Diagnosis not present

## 2019-01-26 DIAGNOSIS — E872 Acidosis: Secondary | ICD-10-CM | POA: Diagnosis not present

## 2019-01-26 DIAGNOSIS — I469 Cardiac arrest, cause unspecified: Secondary | ICD-10-CM | POA: Diagnosis not present

## 2019-01-26 DIAGNOSIS — N179 Acute kidney failure, unspecified: Secondary | ICD-10-CM | POA: Diagnosis present

## 2019-01-26 DIAGNOSIS — Z20828 Contact with and (suspected) exposure to other viral communicable diseases: Secondary | ICD-10-CM | POA: Diagnosis present

## 2019-01-26 DIAGNOSIS — D638 Anemia in other chronic diseases classified elsewhere: Secondary | ICD-10-CM | POA: Diagnosis not present

## 2019-01-26 DIAGNOSIS — R5381 Other malaise: Secondary | ICD-10-CM | POA: Diagnosis not present

## 2019-01-26 DIAGNOSIS — W19XXXD Unspecified fall, subsequent encounter: Secondary | ICD-10-CM | POA: Diagnosis not present

## 2019-01-26 DIAGNOSIS — T796XXA Traumatic ischemia of muscle, initial encounter: Secondary | ICD-10-CM | POA: Diagnosis not present

## 2019-01-26 DIAGNOSIS — R161 Splenomegaly, not elsewhere classified: Secondary | ICD-10-CM | POA: Diagnosis not present

## 2019-01-26 DIAGNOSIS — W109XXA Fall (on) (from) unspecified stairs and steps, initial encounter: Secondary | ICD-10-CM | POA: Diagnosis present

## 2019-01-26 DIAGNOSIS — Z806 Family history of leukemia: Secondary | ICD-10-CM

## 2019-01-26 DIAGNOSIS — I10 Essential (primary) hypertension: Secondary | ICD-10-CM | POA: Diagnosis not present

## 2019-01-26 DIAGNOSIS — R21 Rash and other nonspecific skin eruption: Secondary | ICD-10-CM | POA: Diagnosis present

## 2019-01-26 DIAGNOSIS — Z951 Presence of aortocoronary bypass graft: Secondary | ICD-10-CM | POA: Diagnosis not present

## 2019-01-26 DIAGNOSIS — S32018A Other fracture of first lumbar vertebra, initial encounter for closed fracture: Secondary | ICD-10-CM | POA: Diagnosis not present

## 2019-01-26 DIAGNOSIS — L03119 Cellulitis of unspecified part of limb: Secondary | ICD-10-CM | POA: Diagnosis not present

## 2019-01-26 DIAGNOSIS — M2550 Pain in unspecified joint: Secondary | ICD-10-CM | POA: Diagnosis not present

## 2019-01-26 DIAGNOSIS — M459 Ankylosing spondylitis of unspecified sites in spine: Secondary | ICD-10-CM | POA: Diagnosis not present

## 2019-01-26 DIAGNOSIS — Z8719 Personal history of other diseases of the digestive system: Secondary | ICD-10-CM | POA: Diagnosis not present

## 2019-01-26 DIAGNOSIS — R7989 Other specified abnormal findings of blood chemistry: Secondary | ICD-10-CM | POA: Diagnosis not present

## 2019-01-26 DIAGNOSIS — E11618 Type 2 diabetes mellitus with other diabetic arthropathy: Secondary | ICD-10-CM | POA: Diagnosis not present

## 2019-01-26 DIAGNOSIS — Z803 Family history of malignant neoplasm of breast: Secondary | ICD-10-CM

## 2019-01-26 DIAGNOSIS — R778 Other specified abnormalities of plasma proteins: Secondary | ICD-10-CM | POA: Diagnosis not present

## 2019-01-26 DIAGNOSIS — E876 Hypokalemia: Secondary | ICD-10-CM | POA: Diagnosis not present

## 2019-01-26 DIAGNOSIS — S32011A Stable burst fracture of first lumbar vertebra, initial encounter for closed fracture: Principal | ICD-10-CM | POA: Diagnosis present

## 2019-01-26 DIAGNOSIS — K7200 Acute and subacute hepatic failure without coma: Secondary | ICD-10-CM | POA: Diagnosis present

## 2019-01-26 DIAGNOSIS — Z789 Other specified health status: Secondary | ICD-10-CM | POA: Diagnosis not present

## 2019-01-26 DIAGNOSIS — F329 Major depressive disorder, single episode, unspecified: Secondary | ICD-10-CM | POA: Diagnosis present

## 2019-01-26 DIAGNOSIS — M7989 Other specified soft tissue disorders: Secondary | ICD-10-CM | POA: Diagnosis not present

## 2019-01-26 DIAGNOSIS — K7010 Alcoholic hepatitis without ascites: Secondary | ICD-10-CM | POA: Diagnosis present

## 2019-01-26 DIAGNOSIS — M6281 Muscle weakness (generalized): Secondary | ICD-10-CM | POA: Diagnosis not present

## 2019-01-26 DIAGNOSIS — R188 Other ascites: Secondary | ICD-10-CM

## 2019-01-26 DIAGNOSIS — Z8249 Family history of ischemic heart disease and other diseases of the circulatory system: Secondary | ICD-10-CM

## 2019-01-26 DIAGNOSIS — Z87891 Personal history of nicotine dependence: Secondary | ICD-10-CM

## 2019-01-26 DIAGNOSIS — Z955 Presence of coronary angioplasty implant and graft: Secondary | ICD-10-CM

## 2019-01-26 DIAGNOSIS — W19XXXA Unspecified fall, initial encounter: Secondary | ICD-10-CM | POA: Diagnosis not present

## 2019-01-26 DIAGNOSIS — S32010D Wedge compression fracture of first lumbar vertebra, subsequent encounter for fracture with routine healing: Secondary | ICD-10-CM | POA: Diagnosis not present

## 2019-01-26 DIAGNOSIS — Z7401 Bed confinement status: Secondary | ICD-10-CM | POA: Diagnosis not present

## 2019-01-26 DIAGNOSIS — M6282 Rhabdomyolysis: Secondary | ICD-10-CM | POA: Diagnosis not present

## 2019-01-26 DIAGNOSIS — Z7982 Long term (current) use of aspirin: Secondary | ICD-10-CM

## 2019-01-26 DIAGNOSIS — Z79899 Other long term (current) drug therapy: Secondary | ICD-10-CM

## 2019-01-26 HISTORY — DX: Acute viral hepatitis, unspecified: B17.9

## 2019-01-26 LAB — CBC WITH DIFFERENTIAL/PLATELET
Abs Immature Granulocytes: 0.03 10*3/uL (ref 0.00–0.07)
Basophils Absolute: 0 10*3/uL (ref 0.0–0.1)
Basophils Relative: 0 %
Eosinophils Absolute: 0.1 10*3/uL (ref 0.0–0.5)
Eosinophils Relative: 1 %
HCT: 32.4 % — ABNORMAL LOW (ref 39.0–52.0)
Hemoglobin: 10.6 g/dL — ABNORMAL LOW (ref 13.0–17.0)
Immature Granulocytes: 1 %
Lymphocytes Relative: 7 %
Lymphs Abs: 0.4 10*3/uL — ABNORMAL LOW (ref 0.7–4.0)
MCH: 29.9 pg (ref 26.0–34.0)
MCHC: 32.7 g/dL (ref 30.0–36.0)
MCV: 91.3 fL (ref 80.0–100.0)
Monocytes Absolute: 0.7 10*3/uL (ref 0.1–1.0)
Monocytes Relative: 11 %
Neutro Abs: 5.1 10*3/uL (ref 1.7–7.7)
Neutrophils Relative %: 80 %
Platelets: 61 10*3/uL — ABNORMAL LOW (ref 150–400)
RBC: 3.55 MIL/uL — ABNORMAL LOW (ref 4.22–5.81)
RDW: 22.1 % — ABNORMAL HIGH (ref 11.5–15.5)
WBC: 6.4 10*3/uL (ref 4.0–10.5)
nRBC: 0 % (ref 0.0–0.2)

## 2019-01-26 LAB — URINALYSIS, ROUTINE W REFLEX MICROSCOPIC
Bacteria, UA: NONE SEEN
Bilirubin Urine: NEGATIVE
Glucose, UA: NEGATIVE mg/dL
Ketones, ur: NEGATIVE mg/dL
Leukocytes,Ua: NEGATIVE
Nitrite: NEGATIVE
Protein, ur: 100 mg/dL — AB
Specific Gravity, Urine: 1.008 (ref 1.005–1.030)
pH: 6 (ref 5.0–8.0)

## 2019-01-26 LAB — IRON AND TIBC
Iron: 167 ug/dL (ref 45–182)
Saturation Ratios: 67 % — ABNORMAL HIGH (ref 17.9–39.5)
TIBC: 249 ug/dL — ABNORMAL LOW (ref 250–450)
UIBC: 82 ug/dL

## 2019-01-26 LAB — ACETAMINOPHEN LEVEL: Acetaminophen (Tylenol), Serum: <10 ug/mL — ABNORMAL LOW (ref 10–30)

## 2019-01-26 LAB — CK: Total CK: 32119 U/L — ABNORMAL HIGH (ref 49–397)

## 2019-01-26 LAB — RETICULOCYTES
Immature Retic Fract: 23.6 % — ABNORMAL HIGH (ref 2.3–15.9)
RBC.: 3.25 MIL/uL — ABNORMAL LOW (ref 4.22–5.81)
Retic Count, Absolute: 62.4 10*3/uL (ref 19.0–186.0)
Retic Ct Pct: 1.9 % (ref 0.4–3.1)

## 2019-01-26 LAB — COMPREHENSIVE METABOLIC PANEL
ALT: 374 U/L — ABNORMAL HIGH (ref 0–44)
AST: 1632 U/L — ABNORMAL HIGH (ref 15–41)
Albumin: 2.4 g/dL — ABNORMAL LOW (ref 3.5–5.0)
Alkaline Phosphatase: 246 U/L — ABNORMAL HIGH (ref 38–126)
Anion gap: 12 (ref 5–15)
BUN: 39 mg/dL — ABNORMAL HIGH (ref 8–23)
CO2: 17 mmol/L — ABNORMAL LOW (ref 22–32)
Calcium: 7.4 mg/dL — ABNORMAL LOW (ref 8.9–10.3)
Chloride: 99 mmol/L (ref 98–111)
Creatinine, Ser: 2.92 mg/dL — ABNORMAL HIGH (ref 0.61–1.24)
GFR calc Af Amer: 24 mL/min — ABNORMAL LOW (ref >60)
GFR calc non Af Amer: 21 mL/min — ABNORMAL LOW (ref >60)
Glucose, Bld: 105 mg/dL — ABNORMAL HIGH (ref 70–99)
Potassium: 3.6 mmol/L (ref 3.5–5.1)
Sodium: 128 mmol/L — ABNORMAL LOW (ref 135–145)
Total Bilirubin: 6.3 mg/dL — ABNORMAL HIGH (ref 0.3–1.2)
Total Protein: 6.6 g/dL (ref 6.5–8.1)

## 2019-01-26 LAB — LACTATE DEHYDROGENASE: LDH: 1160 U/L — ABNORMAL HIGH (ref 98–192)

## 2019-01-26 LAB — PROTIME-INR
INR: 1.4 — ABNORMAL HIGH (ref 0.8–1.2)
Prothrombin Time: 16.8 seconds — ABNORMAL HIGH (ref 11.4–15.2)

## 2019-01-26 LAB — PHOSPHORUS: Phosphorus: 3.4 mg/dL (ref 2.5–4.6)

## 2019-01-26 LAB — FERRITIN: Ferritin: 494 ng/mL — ABNORMAL HIGH (ref 24–336)

## 2019-01-26 LAB — NA AND K (SODIUM & POTASSIUM), RAND UR
Potassium Urine: 15 mmol/L
Sodium, Ur: 13 mmol/L

## 2019-01-26 LAB — MAGNESIUM: Magnesium: 1.5 mg/dL — ABNORMAL LOW (ref 1.7–2.4)

## 2019-01-26 LAB — TROPONIN I (HIGH SENSITIVITY): Troponin I (High Sensitivity): 26 ng/L — ABNORMAL HIGH (ref <18)

## 2019-01-26 LAB — ETHANOL: Alcohol, Ethyl (B): <10 mg/dL (ref <10)

## 2019-01-26 LAB — APTT: aPTT: 41 seconds — ABNORMAL HIGH (ref 24–36)

## 2019-01-26 LAB — HEMOGLOBIN A1C
Hgb A1c MFr Bld: 5.1 % (ref 4.8–5.6)
Mean Plasma Glucose: 99.67 mg/dL

## 2019-01-26 MED ORDER — METOPROLOL SUCCINATE ER 50 MG PO TB24
50.0000 mg | ORAL_TABLET | Freq: Every day | ORAL | Status: DC
  Administered 2019-01-27 – 2019-02-08 (×13): 50 mg via ORAL
  Filled 2019-01-26 (×13): qty 1

## 2019-01-26 MED ORDER — VITAMIN B-1 100 MG PO TABS
100.0000 mg | ORAL_TABLET | Freq: Every day | ORAL | Status: DC
  Administered 2019-01-26 – 2019-02-08 (×14): 100 mg via ORAL
  Filled 2019-01-26 (×14): qty 1

## 2019-01-26 MED ORDER — SODIUM CHLORIDE 0.9 % IV BOLUS
1000.0000 mL | Freq: Once | INTRAVENOUS | Status: AC
  Administered 2019-01-26: 18:00:00 1000 mL via INTRAVENOUS

## 2019-01-26 MED ORDER — LACTATED RINGERS IV SOLN
INTRAVENOUS | Status: DC
  Administered 2019-01-26 – 2019-01-27 (×2): via INTRAVENOUS

## 2019-01-26 MED ORDER — BUPROPION HCL ER (XL) 150 MG PO TB24
150.0000 mg | ORAL_TABLET | ORAL | Status: DC
  Administered 2019-01-27 – 2019-02-08 (×7): 150 mg via ORAL
  Filled 2019-01-26 (×7): qty 1

## 2019-01-26 MED ORDER — SENNA 8.6 MG PO TABS
1.0000 | ORAL_TABLET | Freq: Two times a day (BID) | ORAL | Status: DC
  Administered 2019-01-27 – 2019-02-08 (×23): 8.6 mg via ORAL
  Filled 2019-01-26 (×24): qty 1

## 2019-01-26 MED ORDER — MORPHINE SULFATE (PF) 4 MG/ML IV SOLN
4.0000 mg | Freq: Once | INTRAVENOUS | Status: AC
  Administered 2019-01-26: 17:00:00 4 mg via INTRAVENOUS
  Filled 2019-01-26: qty 1

## 2019-01-26 MED ORDER — INSULIN ASPART 100 UNIT/ML ~~LOC~~ SOLN
0.0000 [IU] | Freq: Three times a day (TID) | SUBCUTANEOUS | Status: DC
  Administered 2019-01-28: 13:00:00 1 [IU] via SUBCUTANEOUS
  Administered 2019-01-29: 13:00:00 3 [IU] via SUBCUTANEOUS

## 2019-01-26 MED ORDER — ENOXAPARIN SODIUM 40 MG/0.4ML ~~LOC~~ SOLN: 40.0000 mg | SUBCUTANEOUS | Status: DC

## 2019-01-26 MED ORDER — DOCUSATE SODIUM 100 MG PO CAPS
100.0000 mg | ORAL_CAPSULE | Freq: Two times a day (BID) | ORAL | Status: DC
  Administered 2019-01-27 – 2019-02-08 (×22): 100 mg via ORAL
  Filled 2019-01-26 (×24): qty 1

## 2019-01-26 MED ORDER — HEPARIN SODIUM (PORCINE) 5000 UNIT/ML IJ SOLN
5000.0000 [IU] | Freq: Three times a day (TID) | INTRAMUSCULAR | Status: DC
  Administered 2019-01-26 – 2019-02-08 (×37): 5000 [IU] via SUBCUTANEOUS
  Filled 2019-01-26 (×35): qty 1

## 2019-01-26 MED ORDER — ONDANSETRON HCL 4 MG/2ML IJ SOLN: 4.0000 mg | Freq: Four times a day (QID) | INTRAMUSCULAR | Status: DC | PRN

## 2019-01-26 MED ORDER — ADULT MULTIVITAMIN W/MINERALS CH
1.0000 | ORAL_TABLET | Freq: Every day | ORAL | Status: DC
  Administered 2019-01-26 – 2019-02-08 (×14): 1 via ORAL
  Filled 2019-01-26 (×14): qty 1

## 2019-01-26 MED ORDER — FLUTICASONE PROPIONATE 50 MCG/ACT NA SUSP
1.0000 | Freq: Every day | NASAL | Status: DC
  Administered 2019-01-27 – 2019-02-07 (×12): 1 via NASAL
  Filled 2019-01-26: qty 16

## 2019-01-26 MED ORDER — FOLIC ACID 1 MG PO TABS
1.0000 mg | ORAL_TABLET | Freq: Every day | ORAL | Status: DC
  Administered 2019-01-26 – 2019-02-08 (×14): 1 mg via ORAL
  Filled 2019-01-26 (×14): qty 1

## 2019-01-26 MED ORDER — OXYCODONE HCL 5 MG PO TABS
5.0000 mg | ORAL_TABLET | ORAL | Status: DC | PRN
  Administered 2019-01-26: 23:00:00 5 mg via ORAL
  Administered 2019-01-27: 10 mg via ORAL
  Administered 2019-01-27: 18:00:00 5 mg via ORAL
  Administered 2019-01-28: 10 mg via ORAL
  Administered 2019-01-28 – 2019-01-29 (×2): 5 mg via ORAL
  Administered 2019-01-29: 20:00:00 10 mg via ORAL
  Administered 2019-01-29: 02:00:00 5 mg via ORAL
  Administered 2019-01-29 – 2019-02-02 (×17): 10 mg via ORAL
  Administered 2019-02-02: 5 mg via ORAL
  Administered 2019-02-02 – 2019-02-08 (×26): 10 mg via ORAL
  Filled 2019-01-26 (×18): qty 2
  Filled 2019-01-26: qty 1
  Filled 2019-01-26 (×3): qty 2
  Filled 2019-01-26: qty 1
  Filled 2019-01-26 (×5): qty 2
  Filled 2019-01-26: qty 1
  Filled 2019-01-26: qty 2
  Filled 2019-01-26: qty 1
  Filled 2019-01-26 (×10): qty 2
  Filled 2019-01-26: qty 1
  Filled 2019-01-26: qty 2
  Filled 2019-01-26: qty 1
  Filled 2019-01-26 (×10): qty 2

## 2019-01-26 MED ORDER — PANTOPRAZOLE SODIUM 40 MG PO TBEC
40.0000 mg | DELAYED_RELEASE_TABLET | Freq: Every day | ORAL | Status: DC
  Administered 2019-01-27 – 2019-02-08 (×13): 40 mg via ORAL
  Filled 2019-01-26 (×11): qty 1

## 2019-01-26 MED ORDER — ONDANSETRON HCL 4 MG PO TABS: 4.0000 mg | ORAL_TABLET | Freq: Four times a day (QID) | ORAL | Status: DC | PRN

## 2019-01-26 MED ORDER — VITAMIN B-12 1000 MCG PO TABS
1000.0000 ug | ORAL_TABLET | Freq: Every day | ORAL | Status: DC
  Administered 2019-01-27 – 2019-02-08 (×13): 1000 ug via ORAL
  Filled 2019-01-26 (×13): qty 1

## 2019-01-26 MED ORDER — POLYETHYLENE GLYCOL 3350 17 G PO PACK
17.0000 g | PACK | Freq: Every day | ORAL | Status: DC
  Administered 2019-01-28 – 2019-02-08 (×11): 17 g via ORAL
  Filled 2019-01-26 (×10): qty 1

## 2019-01-26 NOTE — H&P (Addendum)
History and Physical    Gregory Hansen [id number] DOB: January 19, 1951 DOA: 01/26/2019  PCP: [name], DO Patient coming from: Home  I have personally briefly reviewed patient's old medical records in [place]  Chief Complaint: Fall  HPI: Gregory Hansen is a 68 y.o. male with medical history significant for CAD/MI status post CABG, ankylosing spondylitis, hypertension, diabetes mellitus type 2 and alcohol abuse who presented to the ED from home after a mechanical fall in his garage.  Patient reports that he was walking up the steps into his home when he felt weak and lightheaded and tried to grab onto the door jambs, but his arms gave out and he fell backward onto his back. He did not lose consciousness. No preceding chest pain or palpitations. He states that he was down on the ground for approximately 1 hour unable to get up and he was assisted into his home by an [place] delivery man.  He endorses pain in his back and weakness in his lower extremities.  He reports that his weakness has been ongoing now for some time and is associated with fatigue.  He has also noticed increased abdominal swelling.  He denies fever, chills, night sweats, weight loss, arthritis/arthralgias, change in urination, hematuria.  He endorses nausea without vomiting and rash that has been fairly chronic, although worsened recently.  He endorses 3-4 shots of bourbon per day, and his daughter corroborates this although she states that they are often double shots. No history of DTs or alcohol withdrawal seizures. He has no known history of prior liver disease, no known history of renal disease.  Denies recent antibiotic use, no new medications.  Denies Tylenol use.  ED Course: In the ED, patient afebrile, normotensive and saturating comfortably on room air.  Labs notable for hemoglobin 10.6, platelets 61, sodium 128, CO2 17, BUN 39, creatinine 2.92, albumin 2.4, T bili 6.3, AST 1632, ALT 1374, ALP 246.  CT of the  L-spine showed acute burst fracture of L1 without body height loss or spinal cord involvement.  Review of Systems: As per HPI otherwise 10 point review of systems negative.   Past Medical History:  Diagnosis Date  . Anginal pain ([place])   . Anxiety   . Arthritis    SPINE   . Arthritis   . Coronary artery disease    s/p multiple percutaneous interventions, PCI 202 for DMI and DES distal RCA and CFX-OM 2006  . Coronary artery disease   . Depression   . GERD (gastroesophageal reflux disease)   . Hyperlipidemia    mixed  . Hyperlipidemia   . Hypertension   . MI (myocardial infarction) ([place])   . Myocardial infarction ([place])   . Peripheral neuropathy    calves and both feet  . Peripheral neuropathy    calves and both feet  . Spondylitis, ankylosing ([place])     Past Surgical History:  Procedure Laterality Date  . CARDIAC CATHETERIZATION     stent RCA June3, 2002, Stent OM/PTCA circumflex August 13, 2000  . CORONARY ANGIOPLASTY WITH STENT PLACEMENT     first one in 2006; had another place 3 months ago (August 2013)  . CORONARY ANGIOPLASTY WITH STENT PLACEMENT  03/26/2013   RCA           DR COOPER  . CORONARY ANGIOPLASTY WITH STENT PLACEMENT    . CORONARY ARTERY BYPASS GRAFT N/A 10/29/2013   Procedure: CORONARY ARTERY BYPASS GRAFTING x 4 (LIMA-LAD, SVG-OM, SVG-PD-PL) ENDOSCOPIC VEIN HARVEST  RIGHT THIGH;  Surgeon: Gaye Pollack, MD;  Location: [facility];  Service: Open Heart Surgery;  Laterality: N/A;  . CORONARY ARTERY BYPASS GRAFT    . FINGER SURGERY     5  TH     DIGIT RIGHT HAND  . FINGER SURGERY    . INTRAOPERATIVE TRANSESOPHAGEAL ECHOCARDIOGRAM N/A 10/29/2013   Procedure: INTRAOPERATIVE TRANSESOPHAGEAL ECHOCARDIOGRAM;  Surgeon: Gaye Pollack, MD;  Location[facility] OR;  Service: Open Heart Surgery;  Laterality: N/A;  . KNEE ARTHROSCOPY W/ LASER  2003  . LEFT HEART CATHETERIZATION WITH CORONARY ANGIOGRAM N/A 10/01/2011   Procedure: LEFT HEART CATHETERIZATION WITH CORONARY ANGIOGRAM;   Surgeon: [name], MD;  Location: [facility] CATH LAB;  Service: Cardiovascular;  Laterality: N/A;  . LEFT HEART CATHETERIZATION WITH CORONARY ANGIOGRAM N/A 03/26/2013   Procedure: LEFT HEART CATHETERIZATION WITH CORONARY ANGIOGRAM;  Surgeon: Blane Ohara, MD;  Location: [facility] CATH LAB;  Service: Cardiovascular;  Laterality: N/A;  . LEFT HEART CATHETERIZATION WITH CORONARY ANGIOGRAM N/A 10/25/2013   Procedure: LEFT HEART CATHETERIZATION WITH CORONARY ANGIOGRAM;  Surgeon: Blane Ohara, MD;  Location: [facility] CATH LAB;  Service: Cardiovascular;  Laterality: N/A;     reports that he quit smoking about 39 years ago. His smoking use included cigarettes. He has a 20.00 pack-year smoking history. He has never used smokeless tobacco. He reports current alcohol use of about 16.0 standard drinks of alcohol per week. He reports that he does not use drugs.  No Known Allergies  Family History  Problem Relation Age of Onset  . Breast cancer Mother 40  . Heart disease Father   . Heart disease Brother   . Leukemia Maternal Grandmother   . Heart disease Maternal Grandfather   . Heart disease Other        positive for cardiac disease and both brothers have had cardiac events  . Heart attack Brother      Prior to Admission medications   Medication Sig Start Date End Date Taking? Authorizing Provider  aspirin 81 MG tablet Take 81 mg by mouth every evening.    Yes [provider]  atorvastatin (LIPITOR) 80 MG tablet TAKE 1 TABLET BY MOUTH  DAILY Patient taking differently: Take 80 mg by mouth daily at 6 PM.  12/08/18  Yes [name], MD  buPROPion (WELLBUTRIN XL) 300 MG 24 hr tablet Take 300 mg by mouth daily.   Yes [provider]  fluticasone (FLONASE) 50 MCG/ACT nasal spray Place 2 sprays into both nostrils every evening.    Yes [provider]  loratadine (CLARITIN) 10 MG tablet Take 10 mg by mouth daily.   Yes [provider]  losartan (COZAAR) 50 MG tablet TAKE 1  TABLET BY MOUTH AT  BEDTIME Patient taking differently: Take 50 mg by mouth at bedtime.  01/08/19  Yes [name], DO  metoprolol succinate (TOPROL-XL) 50 MG 24 hr tablet TAKE 1 TABLET BY MOUTH  DAILY Patient taking differently: Take 50 mg by mouth daily.  12/08/18  Yes [name], MD  nefazodone (SERZONE) 200 MG tablet Take 200 mg by mouth 2 (two) times daily.    Yes [provider]  pantoprazole (PROTONIX) 40 MG tablet TAKE 1 TABLET BY MOUTH  DAILY Patient taking differently: Take 40 mg by mouth daily.  12/08/18  Yes [name], MD  vitamin B-12 (CYANOCOBALAMIN) 1000 MCG tablet Take 1,000 mcg by mouth daily.   Yes [provider]  clotrimazole (LOTRIMIN) 1 % cream Apply 1 application topically  2 (two) times daily. 05/19/18   Mullis, Kiersten P, DO  nitroGLYCERIN (NITROSTAT) 0.4 MG SL tablet Place 0.4 mg under the tongue every 5 (five) minutes as needed for chest pain.    [provider]  triamcinolone cream (KENALOG) 0.1 % Apply 1 application topically daily. 05/19/18   Danna Hefty, DO    Physical Exam: Vitals:   01/26/19 1900 01/26/19 1930 01/26/19 2100 01/26/19 2107  BP: (!) 157/87 (!) 169/86 (!) 152/79   Pulse: 80 79 78   Resp: (!) 23 (!) 21 18   Temp:   97.6 F (36.4 C)   TempSrc:   Oral   SpO2: 99% 99% 98%   Weight:    119.1 kg  Height:    6\' 2"  (1.88 m)    Constitutional: NAD, calm, comfortable Eyes: PERRL, +scleral icterus ENMT: Mucous membranes are moist. Posterior pharynx clear of any exudate or lesions.  Neck: normal, supple, no masses Respiratory: clear to auscultation bilaterally, no wheezing, no crackles Cardiovascular: Regular rate and rhythm, no murmurs / rubs / gallops.  1+ pitting edema of right lower extremity, trace on left. 2+ pedal pulses.  Abdomen: Distended, no tenderness, no masses palpated. Bowel sounds positive.  Musculoskeletal: no clubbing / cyanosis. No joint deformity upper and lower extremities. Good ROM,  no contractures. Normal muscle tone.  Skin: Scaling, blanchable, erythematous lesions on lower abdomen and extremities Neurologic: CN 2-12 grossly intact. Sensation intact. Strength 5/5 in all 4.  Psychiatric: Normal judgment and insight. Alert and oriented x 3. Normal mood.    Labs on Admission: I have personally reviewed following labs and imaging studies  CBC: Recent Labs  Lab 01/26/19 1702  WBC 6.4  NEUTROABS 5.1  HGB 10.6*  HCT 32.4*  MCV 91.3  PLT 61*   Basic Metabolic Panel: Recent Labs  Lab 01/26/19 1702  NA 128*  K 3.6  CL 99  CO2 17*  GLUCOSE 105*  BUN 39*  CREATININE 2.92*  CALCIUM 7.4*   GFR: Estimated Creatinine Clearance: 32.8 mL/min (A) (by C-G formula based on SCr of 2.92 mg/dL (H)). Liver Function Tests: Recent Labs  Lab 01/26/19 1702  AST 1,632*  ALT 374*  ALKPHOS 246*  BILITOT 6.3*  PROT 6.6  ALBUMIN 2.4*   No results for input(s): LIPASE, AMYLASE in the last 168 hours. No results for input(s): AMMONIA in the last 168 hours. Coagulation Profile: No results for input(s): INR, PROTIME in the last 168 hours. Cardiac Enzymes: No results for input(s): CKTOTAL, CKMB, CKMBINDEX, TROPONINI in the last 168 hours. BNP (last 3 results) No results for input(s): PROBNP in the last 8760 hours. HbA1C: No results for input(s): HGBA1C in the last 72 hours. CBG: No results for input(s): GLUCAP in the last 168 hours. Lipid Profile: No results for input(s): CHOL, HDL, LDLCALC, TRIG, CHOLHDL, LDLDIRECT in the last 72 hours. Thyroid Function Tests: No results for input(s): TSH, T4TOTAL, FREET4, T3FREE, THYROIDAB in the last 72 hours. Anemia Panel: No results for input(s): VITAMINB12, FOLATE, FERRITIN, TIBC, IRON, RETICCTPCT in the last 72 hours. Urine analysis:    Component Value Date/Time   COLORURINE AMBER (A) 11/22/2016 1637   APPEARANCEUR CLOUDY (A) 11/22/2016 1637   LABSPEC 1.023 11/22/2016 1637   PHURINE 5.0 11/22/2016 1637   GLUCOSEU NEGATIVE  11/22/2016 1637   HGBUR NEGATIVE 11/22/2016 1637   BILIRUBINUR SMALL (A) 11/22/2016 1637   KETONESUR NEGATIVE 11/22/2016 1637   PROTEINUR 100 (A) 11/22/2016 1637   UROBILINOGEN 1.0 10/28/2013 1857  NITRITE NEGATIVE 11/22/2016 [place] 11/22/2016 1637    Radiological Exams on Admission: Ct Lumbar Spine Wo Contrast  Result Date: 01/26/2019 CLINICAL DATA:  Lumbosacral spine fracture, traumatic. Additional history provided: Patient fell on back from 2 steps up today. Low back pain. Generalized weakness. EXAM: CT LUMBAR SPINE WITHOUT CONTRAST TECHNIQUE: Multidetector CT imaging of the lumbar spine was performed without intravenous contrast administration. Multiplanar CT image reconstructions were also generated. COMPARISON:  CT abdomen/pelvis 11/22/2016. FINDINGS: Segmentation: 5 lumbar vertebrae. Alignment: Lumbar levocurvature. Straightening of the expected lumbar lordosis. No significant spondylolisthesis. Vertebrae: Acute predominantly horizontally oriented fracture of the L1 vertebral body involving both the anterior and middle columns. Fracture lucencies extend through the posterior aspect of the vertebra (series 10, image 43). Fracture lucencies also extend to the superior endplate and into the left L1 pedicle (series 4, image 39). No significant bony retropulsion. There is little if any L1 vertebral body height loss. No other acute fracture identified. Redemonstrated mild chronic T12 superior endplate deformity with superimposed Schmorl node. Redemonstrated fusion of bilateral sacroiliac joints. Paraspinal and other soft tissues: Paraspinal soft tissues within normal limits. Aortoiliac atherosclerosis. Disc levels: No high-grade bony spinal canal or neural foraminal narrowing at any level. IMPRESSION: 1. Acute predominantly horizontally oriented fracture of the L1 vertebral body. Fracture lucencies extend to the L1 superior endplate, through the posterior margin of the vertebra  and into the left L1 pedicle. There is little, if any, L1 vertebral body height loss. No significant bony retropulsion. 2. Redemonstrated mild chronic T12 superior endplate deformity. Electronically Signed   By: Kellie Simmering DO   On: 01/26/2019 17:36    EKG: Independently reviewed.  Sinus rhythm, no ST-T changes, nonspecific conduction delay.  Assessment/Plan Active Problems:   Acute hepatitis  Mr. Pooler presents with acute hepatitis of uncertain etiology.  AST elevation seems out of proportion, and may be related to muscle injury due to fall, however liver enzymes are significantly elevated in both a hepatocellular and congestive pattern.  Will work-up for common causes of acute hepatitis, including viral hepatitides, HIV, Budd-Chiari.  Patient does meet criteria for alcohol abuse, and perhaps his current hepatitis is representative of alcohol-related liver injury, although it does seem somewhat out of proportion to what would be expected in that setting.  Will assess for cirrhotic liver morphology and/or gallbladder disease, although patient is nontender in the right upper quadrant without infectious signs or symptoms which would point away from gallbladder disease.    Accompanying his acute hepatitis is AKI, also of uncertain etiology. Rhabdomyolysis certainly possible and may explain AST elevation out of proportion to other LFTs. Prerenal component versus ATN in the setting of hypotension are also possible etiologies. Urine studies may help elucidate etiology of renal injury.  If patient is found to have cirrhotic liver morphology, hepatorenal syndrome certainly possible. TTP and HUS are on the differential given concurrent renal disease and thrombocytopenia.  In regards to his L1 fracture, he has been evaluated by neurosurgery and will be fitted with a TLSO brace, no spinal cord involvement noted.   Acute hepatitis of uncertain etiology -Consult GI in AM -Check hepatitis panel, HIV -Check  iron profile -Obtain liver US with Doppler -Hold statin -Check Tylenol level, UDS, alcohol level -Daily CMP, INR -Start folate, thiamine, MVI; monitor for alcohol withdrawal symptoms -Neuro checks  AKI -Volume resuscitation given recent nausea and decreased PO intake -Obtain U/A, urine studies -Check CK -Avoid nephrotoxins, renally dose medications -Trend renal function -Consider Nephrology  consult if renal function not improving  Fall with L1 burst fracture -Neurosurgery consulted -TLSO brace fitting -Engage PT when appropriate -Neuro checks as above -No telemetry as fall non-syncopal  Thrombocytopenia, anemia -Likely in the setting of liver disease -Check haptoglobin, LDH, retics to r/o hemolytic process -Consider peripheral smear  Scaling rash on abdomen, extremities -Consider involving Dermatology for further workup  Chronic medical conditions: -CAD s/p CABG: cont BB, holding statin -DM2: SSI, FSBS ACHS  DVT prophylaxis: Heparin Code Status: Full Disposition Plan: Home in 2-3 days Consults called: None Admission status: Med-Surg   Bennie Pierini MD [facility]  If 7PM-7AM, please contact night-coverage [email or web address] Password [facility]  01/26/2019, 9:32 PM

## 2019-01-26 NOTE — Patient Outreach (Signed)
[place] [facility]) Care Management  01/26/2019  ZHAYNE LOU May 25, 1950 [id number]   Medication Adherence call to Mr. Thornton Denzel HIPPA Compliant Voice message left with a call back number.Mr. Figler is showing past due on Atorvastatin 80 mg under [place].   [place] Management Direct Dial [phone]  Fax [phone] [email or web address]

## 2019-01-26 NOTE — ED Triage Notes (Addendum)
Arrived by [facility] from home. Patient fell flat onto back on concrete flooring while working in garage. Patient states for approximately 1 year he has had generalized weakness and becomes fatigued, with cramping in both shoulders and both thighs. Patient tried catching himself but was unable to maintain balance. Patient drug himself back to house to call 911. Patient denies LOC, denies hitting head. Patient c/o lower back pain secondary to fall. EMS reports patient unable to stand because of lower back pain. Multiple abrasions on knees, arms, feet

## 2019-01-26 NOTE — ED Provider Notes (Addendum)
[facility] DEPT Provider Note   CSN: [id number] Arrival date & time: 01/26/19  1532     History   Chief Complaint Chief Complaint  Patient presents with  . Fall    HPI Gregory Hansen is a 68 y.o. male.     Patient is a 68 year old male with past medical history of coronary artery disease with stent, hypertension, arthritis.  He presents today for evaluation of a fall.  Patient states he was in the garage today recycling boxes.  When he turned to walk back in the house, he he lost his balance and fell backward on his low back.  He is complaining of significant discomfort in his low back.  He had to crawl back into the house and called for help.  He was transported here by ambulance.  He denies any radiation into his legs.  He denies any weakness, numbness, or bowel or bladder complaints.  The history is provided by the patient.  Fall This is a new problem. The current episode started less than 1 hour ago. The problem occurs constantly. The problem has not changed since onset.Pertinent negatives include no chest pain and no headaches. Exacerbated by: Movement and palpation. Nothing relieves the symptoms. He has tried nothing for the symptoms. The treatment provided no relief.    Past Medical History:  Diagnosis Date  . Anginal pain ([place])   . Anxiety   . Arthritis    SPINE   . Arthritis   . Coronary artery disease    s/p multiple percutaneous interventions, PCI 202 for DMI and DES distal RCA and CFX-OM 2006  . Coronary artery disease   . Depression   . GERD (gastroesophageal reflux disease)   . Hyperlipidemia    mixed  . Hyperlipidemia   . Hypertension   . MI (myocardial infarction) ([place])   . Myocardial infarction ([place])   . Peripheral neuropathy    calves and both feet  . Peripheral neuropathy    calves and both feet  . Spondylitis, ankylosing [facility])     Patient Active Problem List   Diagnosis Date Noted  . Leg cramping 12/22/2018  .  Leg swelling 11/20/2018  . Rash and nonspecific skin eruption 05/23/2018  . Cellulitis 03/30/2018  . Acute kidney failure ([place]) 03/30/2018  . Peripheral neuropathy 11/04/2017  . Gynecomastia, male 02/18/2017  . Healthcare maintenance 01/20/2017  . Severe sepsis with acute organ dysfunction ([place]) 12/03/2016  . Aortic atherosclerosis ([place]) 12/03/2016  . Diarrhea due to cryptosporidium ([place])   . S/P CABG x 4 10/29/2013  . Hyperlipidemia with target LDL less than 70 06/15/2008  . DEPRESSION 06/15/2008  . HYPERTENSION, BENIGN 06/15/2008  . CAD (coronary artery disease) 06/15/2008  . GERD 06/15/2008  . SPONDYLITIS, ANKYLOSING 06/15/2008  . Atherosclerotic heart disease of native coronary artery without angina pectoris 06/15/2008  . Gastro-esophageal reflux disease without esophagitis 06/15/2008    Past Surgical History:  Procedure Laterality Date  . CARDIAC CATHETERIZATION     stent RCA June3, 2002, Stent OM/PTCA circumflex August 13, 2000  . CORONARY ANGIOPLASTY WITH STENT PLACEMENT     first one in 2006; had another place 3 months ago (August 2013)  . CORONARY ANGIOPLASTY WITH STENT PLACEMENT  03/26/2013   RCA           DR COOPER  . CORONARY ANGIOPLASTY WITH STENT PLACEMENT    . CORONARY ARTERY BYPASS GRAFT N/A 10/29/2013   Procedure: CORONARY ARTERY BYPASS GRAFTING x 4 (LIMA-LAD, SVG-OM,  SVG-PD-PL) ENDOSCOPIC VEIN HARVEST RIGHT THIGH;  Surgeon: Gaye Pollack, MD;  Location: [facility];  Service: Open Heart Surgery;  Laterality: N/A;  . CORONARY ARTERY BYPASS GRAFT    . FINGER SURGERY     5  TH     DIGIT RIGHT HAND  . FINGER SURGERY    . INTRAOPERATIVE TRANSESOPHAGEAL ECHOCARDIOGRAM N/A 10/29/2013   Procedure: INTRAOPERATIVE TRANSESOPHAGEAL ECHOCARDIOGRAM;  Surgeon: Gaye Pollack, MD;  Location[facility];  Service: Open Heart Surgery;  Laterality: N/A;  . KNEE ARTHROSCOPY W/ LASER  2003  . LEFT HEART CATHETERIZATION WITH CORONARY ANGIOGRAM N/A 10/01/2011   Procedure: LEFT HEART  CATHETERIZATION WITH CORONARY ANGIOGRAM;  Surgeon: [name], MD;  Location: [facility] CATH LAB;  Service: Cardiovascular;  Laterality: N/A;  . LEFT HEART CATHETERIZATION WITH CORONARY ANGIOGRAM N/A 03/26/2013   Procedure: LEFT HEART CATHETERIZATION WITH CORONARY ANGIOGRAM;  Surgeon: Blane Ohara, MD;  Location: [facility] CATH LAB;  Service: Cardiovascular;  Laterality: N/A;  . LEFT HEART CATHETERIZATION WITH CORONARY ANGIOGRAM N/A 10/25/2013   Procedure: LEFT HEART CATHETERIZATION WITH CORONARY ANGIOGRAM;  Surgeon: Blane Ohara, MD;  Location: [facility] CATH LAB;  Service: Cardiovascular;  Laterality: N/A;        Home Medications    Prior to Admission medications   Medication Sig Start Date End Date Taking? Authorizing Provider  aspirin 81 MG tablet Take 81 mg by mouth every evening.    Yes [provider]  atorvastatin (LIPITOR) 80 MG tablet TAKE 1 TABLET BY MOUTH  DAILY Patient taking differently: Take 80 mg by mouth daily at 6 PM.  12/08/18  Yes [name], MD  buPROPion (WELLBUTRIN XL) 300 MG 24 hr tablet Take 300 mg by mouth daily.   Yes [provider]  fluticasone (FLONASE) 50 MCG/ACT nasal spray Place 2 sprays into both nostrils every evening.    Yes [provider]  loratadine (CLARITIN) 10 MG tablet Take 10 mg by mouth daily.   Yes [provider]  losartan (COZAAR) 50 MG tablet TAKE 1 TABLET BY MOUTH AT  BEDTIME Patient taking differently: Take 50 mg by mouth at bedtime.  01/08/19  Yes [name], DO  metoprolol succinate (TOPROL-XL) 50 MG 24 hr tablet TAKE 1 TABLET BY MOUTH  DAILY Patient taking differently: Take 50 mg by mouth daily.  12/08/18  Yes [name], MD  nefazodone (SERZONE) 200 MG tablet Take 200 mg by mouth 2 (two) times daily.    Yes [provider]  pantoprazole (PROTONIX) 40 MG tablet TAKE 1 TABLET BY MOUTH  DAILY Patient taking differently: Take 40 mg by mouth daily.  12/08/18  Yes [name], MD  vitamin  B-12 (CYANOCOBALAMIN) 1000 MCG tablet Take 1,000 mcg by mouth daily.   Yes [provider]  clotrimazole (LOTRIMIN) 1 % cream Apply 1 application topically 2 (two) times daily. 05/19/18   Mullis, Kiersten P, DO  nitroGLYCERIN (NITROSTAT) 0.4 MG SL tablet Place 0.4 mg under the tongue every 5 (five) minutes as needed for chest pain.    [provider]  triamcinolone cream (KENALOG) 0.1 % Apply 1 application topically daily. 05/19/18   Mullis, Archie Endo, DO    Family History Family History  Problem Relation Age of Onset  . Breast cancer Mother 77  . Heart disease Father   . Heart disease Brother   . Leukemia Maternal Grandmother   . Heart disease Maternal Grandfather   . Heart disease Other        positive  for cardiac disease and both brothers have had cardiac events  . Heart attack Brother     Social History Social History   Tobacco Use  . Smoking status: Former Smoker    Packs/day: 2.00    Years: 10.00    Pack years: 20.00    Types: Cigarettes    Quit date: 06/30/1979    Years since quitting: 39.6  . Smokeless tobacco: Never Used  . Tobacco comment: no plans to start back  Substance Use Topics  . Alcohol use: Yes    Alcohol/week: 16.0 standard drinks    Types: 14 Glasses of wine, 2 Shots of liquor per week    Comment: 3-4 glasses of wine at dinner everyday.  . Drug use: No     Allergies   Patient has no known allergies.   Review of Systems Review of Systems  Cardiovascular: Negative for chest pain.  Neurological: Negative for headaches.  All other systems reviewed and are negative.    Physical Exam Updated Vital Signs BP 139/68 (BP Location: Left Arm)   Pulse 74   Temp 97.7 F (36.5 C) (Oral)   Resp 20   SpO2 98%   Physical Exam Vitals signs and nursing note reviewed.  Constitutional:      General: He is not in acute distress.    Appearance: He is well-developed. He is not diaphoretic.  HENT:     Head: Normocephalic and atraumatic.   Neck:     Musculoskeletal: Normal range of motion and neck supple.  Cardiovascular:     Rate and Rhythm: Normal rate and regular rhythm.     Heart sounds: No murmur. No friction rub.  Pulmonary:     Effort: Pulmonary effort is normal. No respiratory distress.     Breath sounds: Normal breath sounds. No wheezing or rales.  Abdominal:     General: Bowel sounds are normal. There is no distension.     Palpations: Abdomen is soft.     Tenderness: There is no abdominal tenderness.  Musculoskeletal: Normal range of motion.     Comments: There is tenderness to palpation in the soft tissues of the lumbar region.  There is no bony tenderness or step-off.  Skin:    General: Skin is warm and dry.  Neurological:     Mental Status: He is alert and oriented to person, place, and time.     Coordination: Coordination normal.     Comments: Strength is 5 out of 5 in both lower extremities.  Sensation is intact throughout.  DTRs are symmetrical.      ED Treatments / Results  Labs (all labs ordered are listed, but only abnormal results are displayed) Labs Reviewed  COMPREHENSIVE METABOLIC PANEL  CBC WITH DIFFERENTIAL/PLATELET    EKG ED ECG REPORT   Date: 02/03/2019  Rate: 73  Rhythm: normal sinus rhythm  QRS Axis: normal  Intervals: normal  ST/T Wave abnormalities: normal  Conduction Disutrbances:nonspecific intraventricular conduction delay  Narrative Interpretation:   Old EKG Reviewed: unchanged  I have personally reviewed the EKG tracing and agree with the computerized printout as noted.   Radiology No results found.  Procedures Procedures (including critical care time)  Medications Ordered in ED Medications  morphine 4 MG/ML injection 4 mg (has no administration in time range)     Initial Impression / Assessment and Plan / ED Course  I have reviewed the triage vital signs and the nursing notes.  Pertinent labs & imaging results that were available during my  care of the  patient were reviewed by me and considered in my medical decision making (see chart for details).  Patient is a 68 year old male with history of coronary artery disease presenting after a fall that occurred in the garage.  Patient with severe low back pain and CT scan that shows an L1 compression fracture.  Patient also appears somewhat jaundiced.  His laboratory studies do reveal significant elevations of his AST, and to a lesser degree ALT and alk phos.  His total bili is 6.3.  It does sound as though this patient drinks a considerable amount of alcohol and I suspect that this is the cause.    Patient will require admission for work-up of his elevated liver and renal function as well as treatment of his low back pain.  Care discussed with Dr. Jonnie Finner who agrees to admit.  Final Clinical Impressions(s) / ED Diagnoses   Final diagnoses:  None    ED Discharge Orders    None       Veryl Speak, MD 01/26/19 Karl Bales    Veryl Speak, MD 02/03/19 1321

## 2019-01-26 NOTE — Consult Note (Signed)
Chief Complaint   Chief Complaint  Patient presents with  . Fall    HPI   Consult requested by: Dr Stark Jock, [place] Reason for consult: L1 fracture  HPI: Gregory Hansen is a 68 y.o. male with multiple medical comorbidities who presented to the ER today for evaluation of back pain after a mechanical fall. He was going up his garage stairs when he fell backwards, landing on lower back. He developed excruciating lower back pain afterwards. An Surveyor, quantity helped him inside where he called EMS. He underwent work up by EDP which included CT L spine which revealed an L1 fracture. NSY consultation requested. Labs were also obtained revealing AKI (Cr 2.98 from 1.04) and elevated liver enzymes. TH were called for admission for further work up of this.  He complains of fairly severe midline lower back pain, worse with ambulating. Denies radicular symptoms. Has chronic bilateral hip weakness. No new weakness in LE.   Does endorse drinking at least 3 "shots" of liquor per night.   Patient Active Problem List   Diagnosis Date Noted  . Acute hepatitis 01/26/2019  . Leg cramping 12/22/2018  . Leg swelling 11/20/2018  . Rash and nonspecific skin eruption 05/23/2018  . Cellulitis 03/30/2018  . Acute kidney failure ([place]) 03/30/2018  . Peripheral neuropathy 11/04/2017  . Gynecomastia, male 02/18/2017  . Healthcare maintenance 01/20/2017  . Severe sepsis with acute organ dysfunction ([place]) 12/03/2016  . Aortic atherosclerosis ([place]) 12/03/2016  . Diarrhea due to cryptosporidium ([place])   . S/P CABG x 4 10/29/2013  . Hyperlipidemia with target LDL less than 70 06/15/2008  . DEPRESSION 06/15/2008  . HYPERTENSION, BENIGN 06/15/2008  . CAD (coronary artery disease) 06/15/2008  . GERD 06/15/2008  . SPONDYLITIS, ANKYLOSING 06/15/2008  . Atherosclerotic heart disease of native coronary artery without angina pectoris 06/15/2008  . Gastro-esophageal reflux disease without esophagitis 06/15/2008     PMH: Past Medical History:  Diagnosis Date  . Anginal pain ([place])   . Anxiety   . Arthritis    SPINE   . Arthritis   . Coronary artery disease    s/p multiple percutaneous interventions, PCI 202 for DMI and DES distal RCA and CFX-OM 2006  . Coronary artery disease   . Depression   . GERD (gastroesophageal reflux disease)   . Hyperlipidemia    mixed  . Hyperlipidemia   . Hypertension   . MI (myocardial infarction) ([place])   . Myocardial infarction ([employer])   . Peripheral neuropathy    calves and both feet  . Peripheral neuropathy    calves and both feet  . Spondylitis, ankylosing ([facility])     PSH: Past Surgical History:  Procedure Laterality Date  . CARDIAC CATHETERIZATION     stent RCA June3, 2002, Stent OM/PTCA circumflex August 13, 2000  . CORONARY ANGIOPLASTY WITH STENT PLACEMENT     first one in 2006; had another place 3 months ago (August 2013)  . CORONARY ANGIOPLASTY WITH STENT PLACEMENT  03/26/2013   RCA           DR COOPER  . CORONARY ANGIOPLASTY WITH STENT PLACEMENT    . CORONARY ARTERY BYPASS GRAFT N/A 10/29/2013   Procedure: CORONARY ARTERY BYPASS GRAFTING x 4 (LIMA-LAD, SVG-OM, SVG-PD-PL) ENDOSCOPIC VEIN HARVEST RIGHT THIGH;  Surgeon: Gaye Pollack, MD;  Location: [facility] OR;  Service: Open Heart Surgery;  Laterality: N/A;  . CORONARY ARTERY BYPASS GRAFT    . FINGER SURGERY     5  TH  DIGIT RIGHT HAND  . FINGER SURGERY    . INTRAOPERATIVE TRANSESOPHAGEAL ECHOCARDIOGRAM N/A 10/29/2013   Procedure: INTRAOPERATIVE TRANSESOPHAGEAL ECHOCARDIOGRAM;  Surgeon: Gaye Pollack, MD;  Location: [facility] OR;  Service: Open Heart Surgery;  Laterality: N/A;  . KNEE ARTHROSCOPY W/ LASER  2003  . LEFT HEART CATHETERIZATION WITH CORONARY ANGIOGRAM N/A 10/01/2011   Procedure: LEFT HEART CATHETERIZATION WITH CORONARY ANGIOGRAM;  Surgeon: [name], MD;  Location: [facility] CATH LAB;  Service: Cardiovascular;  Laterality: N/A;  . LEFT HEART CATHETERIZATION WITH CORONARY ANGIOGRAM N/A 03/26/2013    Procedure: LEFT HEART CATHETERIZATION WITH CORONARY ANGIOGRAM;  Surgeon: Blane Ohara, MD;  Location: [facility] CATH LAB;  Service: Cardiovascular;  Laterality: N/A;  . LEFT HEART CATHETERIZATION WITH CORONARY ANGIOGRAM N/A 10/25/2013   Procedure: LEFT HEART CATHETERIZATION WITH CORONARY ANGIOGRAM;  Surgeon: Blane Ohara, MD;  Location: [facility] CATH LAB;  Service: Cardiovascular;  Laterality: N/A;    (Not in a hospital admission)   SH: Social History   Tobacco Use  . Smoking status: Former Smoker    Packs/day: 2.00    Years: 10.00    Pack years: 20.00    Types: Cigarettes    Quit date: 06/30/1979    Years since quitting: 39.6  . Smokeless tobacco: Never Used  . Tobacco comment: no plans to start back  Substance Use Topics  . Alcohol use: Yes    Alcohol/week: 16.0 standard drinks    Types: 14 Glasses of wine, 2 Shots of liquor per week    Comment: 3-4 glasses of wine at dinner everyday.  . Drug use: No    MEDS: Prior to Admission medications   Medication Sig Start Date End Date Taking? Authorizing Provider  aspirin 81 MG tablet Take 81 mg by mouth every evening.    Yes [provider]  atorvastatin (LIPITOR) 80 MG tablet TAKE 1 TABLET BY MOUTH  DAILY Patient taking differently: Take 80 mg by mouth daily at 6 PM.  12/08/18  Yes [name], MD  buPROPion (WELLBUTRIN XL) 300 MG 24 hr tablet Take 300 mg by mouth daily.   Yes [provider]  fluticasone (FLONASE) 50 MCG/ACT nasal spray Place 2 sprays into both nostrils every evening.    Yes [provider]  loratadine (CLARITIN) 10 MG tablet Take 10 mg by mouth daily.   Yes [provider]  losartan (COZAAR) 50 MG tablet TAKE 1 TABLET BY MOUTH AT  BEDTIME Patient taking differently: Take 50 mg by mouth at bedtime.  01/08/19  Yes [name], DO  metoprolol succinate (TOPROL-XL) 50 MG 24 hr tablet TAKE 1 TABLET BY MOUTH  DAILY Patient taking differently: Take 50 mg by mouth daily.  12/08/18   Yes [name], MD  nefazodone (SERZONE) 200 MG tablet Take 200 mg by mouth 2 (two) times daily.    Yes [provider]  pantoprazole (PROTONIX) 40 MG tablet TAKE 1 TABLET BY MOUTH  DAILY Patient taking differently: Take 40 mg by mouth daily.  12/08/18  Yes [name], MD  vitamin B-12 (CYANOCOBALAMIN) 1000 MCG tablet Take 1,000 mcg by mouth daily.   Yes [provider]  clotrimazole (LOTRIMIN) 1 % cream Apply 1 application topically 2 (two) times daily. 05/19/18   Mullis, Kiersten P, DO  nitroGLYCERIN (NITROSTAT) 0.4 MG SL tablet Place 0.4 mg under the tongue every 5 (five) minutes as needed for chest pain.    [provider]  triamcinolone cream (KENALOG) 0.1 % Apply 1 application topically daily. 05/19/18  Mullis, Kiersten P, DO    ALLERGY: No Known Allergies  Social History   Tobacco Use  . Smoking status: Former Smoker    Packs/day: 2.00    Years: 10.00    Pack years: 20.00    Types: Cigarettes    Quit date: 06/30/1979    Years since quitting: 39.6  . Smokeless tobacco: Never Used  . Tobacco comment: no plans to start back  Substance Use Topics  . Alcohol use: Yes    Alcohol/week: 16.0 standard drinks    Types: 14 Glasses of wine, 2 Shots of liquor per week    Comment: 3-4 glasses of wine at dinner everyday.     Family History  Problem Relation Age of Onset  . Breast cancer Mother 64  . Heart disease Father   . Heart disease Brother   . Leukemia Maternal Grandmother   . Heart disease Maternal Grandfather   . Heart disease Other        positive for cardiac disease and both brothers have had cardiac events  . Heart attack Brother      ROS   Review of Systems  Constitutional: Negative for fever.  HENT: Negative.   Eyes: Negative for blurred vision, double vision and photophobia.  Respiratory: Negative.   Cardiovascular: Negative.   Gastrointestinal: Negative for nausea and vomiting.  Genitourinary: Negative.   Musculoskeletal:  Positive for back pain and myalgias. Negative for neck pain.  Skin: Negative.   Neurological: Positive for weakness (chronic b/l hips). Negative for dizziness, tingling, tremors, sensory change, speech change, focal weakness, seizures, loss of consciousness and headaches.    Exam   Vitals:   01/26/19 1900 01/26/19 1930  BP: (!) 157/87 (!) 169/86  Pulse: 80 79  Resp: (!) 23 (!) 21  Temp:    SpO2: 99% 99%   General appearance: WDWN, NAD Eyes: No scleral injection Cardiovascular: Regular rate and rhythm without murmurs, rubs, gallops. No edema or variciosities. Distal pulses normal. Pulmonary: Effort normal, non-labored breathing Musculoskeletal:     Muscle tone upper extremities: Normal    Muscle tone lower extremities: Normal    Motor exam: Upper Extremities Deltoid Bicep Tricep Grip  Right 5/5 5/5 5/5 5/5  Left 5/5 5/5 5/5 5/5   Lower Extremity IP Quad PF DF EHL  Right 4/5 5/5 5/5 5/5 5/5  Left 4/5 5/5 5/5 5/5 5/5   Neurological Mental Status:    - Patient is awake, alert, oriented to person, place, month, year, and situation    - Patient is able to give a clear and coherent history.    - No signs of aphasia or neglect Cranial Nerves    - II: Visual Fields are full. PERRL    - III/IV/VI: EOMI without ptosis or diploplia.     - V: Facial sensation is grossly normal    - VII: Facial movement is symmetric.     - VIII: hearing is intact to voice    - X: Uvula elevates symmetrically    - XI: Shoulder shrug is symmetric.    - XII: tongue is midline without atrophy or fasciculations.  Sensory: Sensation grossly intact to LT Deep Tendon Reflexes    - 2+ and symmetric in the biceps and patellae. Plantars   - Toes are downgoing bilaterally.   Results - Imaging/Labs   Results for orders placed or performed during the hospital encounter of 01/26/19 (from the past 48 hour(s))  Comprehensive metabolic panel     Status: Abnormal  Collection Time: 01/26/19  5:02 PM  Result  Value Ref Range   Sodium 128 (L) 135 - 145 mmol/L   Potassium 3.6 3.5 - 5.1 mmol/L   Chloride 99 98 - 111 mmol/L   CO2 17 (L) 22 - 32 mmol/L   Glucose, Bld 105 (H) 70 - 99 mg/dL   BUN 39 (H) 8 - 23 mg/dL   Creatinine, Ser 2.92 (H) 0.61 - 1.24 mg/dL   Calcium 7.4 (L) 8.9 - 10.3 mg/dL   Total Protein 6.6 6.5 - 8.1 g/dL   Albumin 2.4 (L) 3.5 - 5.0 g/dL   AST 1,632 (H) 15 - 41 U/L   ALT 374 (H) 0 - 44 U/L   Alkaline Phosphatase 246 (H) 38 - 126 U/L   Total Bilirubin 6.3 (H) 0.3 - 1.2 mg/dL   GFR calc non Af Amer 21 (L) >60 mL/min   GFR calc Af Amer 24 (L) >60 mL/min   Anion gap 12 5 - 15    Comment: Performed at [facility], [place] [address], [place], [place] [address]  CBC with Differential     Status: Abnormal   Collection Time: 01/26/19  5:02 PM  Result Value Ref Range   WBC 6.4 4.0 - 10.5 K/uL   RBC 3.55 (L) 4.22 - 5.81 MIL/uL   Hemoglobin 10.6 (L) 13.0 - 17.0 g/dL   HCT 32.4 (L) 39.0 - 52.0 %   MCV 91.3 80.0 - 100.0 fL   MCH 29.9 26.0 - 34.0 pg   MCHC 32.7 30.0 - 36.0 g/dL   RDW 22.1 (H) 11.5 - 15.5 %   Platelets 61 (L) 150 - 400 K/uL    Comment: REPEATED TO VERIFY PLATELET COUNT CONFIRMED BY SMEAR Immature Platelet Fraction may be clinically indicated, consider ordering this additional test [id number]    nRBC 0.0 0.0 - 0.2 %   Neutrophils Relative % 80 %   Neutro Abs 5.1 1.7 - 7.7 K/uL   Lymphocytes Relative 7 %   Lymphs Abs 0.4 (L) 0.7 - 4.0 K/uL   Monocytes Relative 11 %   Monocytes Absolute 0.7 0.1 - 1.0 K/uL   Eosinophils Relative 1 %   Eosinophils Absolute 0.1 0.0 - 0.5 K/uL   Basophils Relative 0 %   Basophils Absolute 0.0 0.0 - 0.1 K/uL   Immature Granulocytes 1 %   Abs Immature Granulocytes 0.03 0.00 - 0.07 K/uL    Comment: Performed at [facility], [place] [address] [place], [place] [address]  Acetaminophen level     Status: Abnormal   Collection Time: 01/26/19  7:53 PM  Result Value Ref Range   Acetaminophen  (Tylenol), Serum <10 (L) 10 - 30 ug/mL    Comment: (NOTE) Therapeutic concentrations vary significantly. A range of 10-30 ug/mL  may be an effective concentration for many patients. However, some  are best treated at concentrations outside of this range. Acetaminophen concentrations >150 ug/mL at 4 hours after ingestion  and >50 ug/mL at 12 hours after ingestion are often associated with  toxic reactions. Performed at [facility], [place] [address], [place], [place] [address]   Ethanol     Status: None   Collection Time: 01/26/19  7:53 PM  Result Value Ref Range   Alcohol, Ethyl (B) <10 <10 mg/dL    Comment: (NOTE) Lowest detectable limit for serum alcohol is 10 mg/dL. For medical purposes only. Performed at [facility], [place] [address], [place], [place] [address]  Ct Lumbar Spine Wo Contrast  Result Date: 01/26/2019 CLINICAL DATA:  Lumbosacral spine fracture, traumatic. Additional history provided: Patient fell on back from 2 steps up today. Low back pain. Generalized weakness. EXAM: CT LUMBAR SPINE WITHOUT CONTRAST TECHNIQUE: Multidetector CT imaging of the lumbar spine was performed without intravenous contrast administration. Multiplanar CT image reconstructions were also generated. COMPARISON:  CT abdomen/pelvis 11/22/2016. FINDINGS: Segmentation: 5 lumbar vertebrae. Alignment: Lumbar levocurvature. Straightening of the expected lumbar lordosis. No significant spondylolisthesis. Vertebrae: Acute predominantly horizontally oriented fracture of the L1 vertebral body involving both the anterior and middle columns. Fracture lucencies extend through the posterior aspect of the vertebra (series 10, image 43). Fracture lucencies also extend to the superior endplate and into the left L1 pedicle (series 4, image 39). No significant bony retropulsion. There is little if any L1 vertebral body height loss. No other acute fracture identified. Redemonstrated  mild chronic T12 superior endplate deformity with superimposed Schmorl node. Redemonstrated fusion of bilateral sacroiliac joints. Paraspinal and other soft tissues: Paraspinal soft tissues within normal limits. Aortoiliac atherosclerosis. Disc levels: No high-grade bony spinal canal or neural foraminal narrowing at any level. IMPRESSION: 1. Acute predominantly horizontally oriented fracture of the L1 vertebral body. Fracture lucencies extend to the L1 superior endplate, through the posterior margin of the vertebra and into the left L1 pedicle. There is little, if any, L1 vertebral body height loss. No significant bony retropulsion. 2. Redemonstrated mild chronic T12 superior endplate deformity. Electronically Signed   By: Kellie Simmering DO   On: 01/26/2019 17:36   Impression/Plan   68 y.o. male with L1 burst fracture after a mechanical fall. Also found to have AKI and elevated liver enzymes. He is neurologically intact with exception of chronic bilateral hip weakness. He is admitted under [place] for pain control and work up of AKI/elevated liver enzymes.  L1 fracture: no retropulsion or resultant abnormal alignment.  - No indication for acute NS intervention. Will treat conservatively.  - TLSO brace which is to be worn upright and OOB (ordered) - Plan for outpatient follow up in 2 weeks for monitoring with Xrays  Please call for any concerns  Ferne Reus, [facility]

## 2019-01-27 ENCOUNTER — Encounter (HOSPITAL_COMMUNITY): Payer: Self-pay | Admitting: *Deleted

## 2019-01-27 DIAGNOSIS — R778 Other specified abnormalities of plasma proteins: Secondary | ICD-10-CM

## 2019-01-27 DIAGNOSIS — I2510 Atherosclerotic heart disease of native coronary artery without angina pectoris: Secondary | ICD-10-CM

## 2019-01-27 DIAGNOSIS — T796XXA Traumatic ischemia of muscle, initial encounter: Secondary | ICD-10-CM

## 2019-01-27 DIAGNOSIS — E11618 Type 2 diabetes mellitus with other diabetic arthropathy: Secondary | ICD-10-CM

## 2019-01-27 DIAGNOSIS — R29898 Other symptoms and signs involving the musculoskeletal system: Secondary | ICD-10-CM

## 2019-01-27 DIAGNOSIS — R7989 Other specified abnormal findings of blood chemistry: Secondary | ICD-10-CM

## 2019-01-27 DIAGNOSIS — I252 Old myocardial infarction: Secondary | ICD-10-CM

## 2019-01-27 DIAGNOSIS — D638 Anemia in other chronic diseases classified elsewhere: Secondary | ICD-10-CM

## 2019-01-27 DIAGNOSIS — Z951 Presence of aortocoronary bypass graft: Secondary | ICD-10-CM

## 2019-01-27 DIAGNOSIS — E876 Hypokalemia: Secondary | ICD-10-CM

## 2019-01-27 DIAGNOSIS — Y92009 Unspecified place in unspecified non-institutional (private) residence as the place of occurrence of the external cause: Secondary | ICD-10-CM

## 2019-01-27 DIAGNOSIS — E871 Hypo-osmolality and hyponatremia: Secondary | ICD-10-CM

## 2019-01-27 DIAGNOSIS — S32011A Stable burst fracture of first lumbar vertebra, initial encounter for closed fracture: Principal | ICD-10-CM

## 2019-01-27 DIAGNOSIS — F1020 Alcohol dependence, uncomplicated: Secondary | ICD-10-CM

## 2019-01-27 DIAGNOSIS — Z789 Other specified health status: Secondary | ICD-10-CM

## 2019-01-27 DIAGNOSIS — R748 Abnormal levels of other serum enzymes: Secondary | ICD-10-CM

## 2019-01-27 DIAGNOSIS — W19XXXA Unspecified fall, initial encounter: Secondary | ICD-10-CM

## 2019-01-27 DIAGNOSIS — E872 Acidosis: Secondary | ICD-10-CM

## 2019-01-27 LAB — GLUCOSE, CAPILLARY
Glucose-Capillary: 95 mg/dL (ref 70–99)
Glucose-Capillary: 89 mg/dL (ref 70–99)
Glucose-Capillary: 111 mg/dL — ABNORMAL HIGH (ref 70–99)
Glucose-Capillary: 88 mg/dL (ref 70–99)
Glucose-Capillary: 98 mg/dL (ref 70–99)

## 2019-01-27 LAB — HEPATITIS PANEL, ACUTE
HCV Ab: NONREACTIVE
Hep A IgM: NONREACTIVE
Hep B C IgM: NONREACTIVE
Hepatitis B Surface Ag: NONREACTIVE

## 2019-01-27 LAB — COMPREHENSIVE METABOLIC PANEL
ALT: 367 U/L — ABNORMAL HIGH (ref 0–44)
ALT: 349 U/L — ABNORMAL HIGH (ref 0–44)
AST: 1643 U/L — ABNORMAL HIGH (ref 15–41)
AST: 1471 U/L — ABNORMAL HIGH (ref 15–41)
Albumin: 2.2 g/dL — ABNORMAL LOW (ref 3.5–5.0)
Albumin: 2.3 g/dL — ABNORMAL LOW (ref 3.5–5.0)
Alkaline Phosphatase: 226 U/L — ABNORMAL HIGH (ref 38–126)
Alkaline Phosphatase: 228 U/L — ABNORMAL HIGH (ref 38–126)
Anion gap: 10 (ref 5–15)
Anion gap: 7 (ref 5–15)
BUN: 39 mg/dL — ABNORMAL HIGH (ref 8–23)
BUN: 40 mg/dL — ABNORMAL HIGH (ref 8–23)
CO2: 19 mmol/L — ABNORMAL LOW (ref 22–32)
CO2: 19 mmol/L — ABNORMAL LOW (ref 22–32)
Calcium: 7.4 mg/dL — ABNORMAL LOW (ref 8.9–10.3)
Calcium: 7 mg/dL — ABNORMAL LOW (ref 8.9–10.3)
Chloride: 102 mmol/L (ref 98–111)
Chloride: 104 mmol/L (ref 98–111)
Creatinine, Ser: 2.85 mg/dL — ABNORMAL HIGH (ref 0.61–1.24)
Creatinine, Ser: 2.85 mg/dL — ABNORMAL HIGH (ref 0.61–1.24)
GFR calc Af Amer: 25 mL/min — ABNORMAL LOW (ref >60)
GFR calc Af Amer: 25 mL/min — ABNORMAL LOW (ref >60)
GFR calc non Af Amer: 22 mL/min — ABNORMAL LOW (ref >60)
GFR calc non Af Amer: 22 mL/min — ABNORMAL LOW (ref >60)
Glucose, Bld: 101 mg/dL — ABNORMAL HIGH (ref 70–99)
Glucose, Bld: 105 mg/dL — ABNORMAL HIGH (ref 70–99)
Potassium: 3.4 mmol/L — ABNORMAL LOW (ref 3.5–5.1)
Potassium: 3.6 mmol/L (ref 3.5–5.1)
Sodium: 131 mmol/L — ABNORMAL LOW (ref 135–145)
Sodium: 130 mmol/L — ABNORMAL LOW (ref 135–145)
Total Bilirubin: 5.3 mg/dL — ABNORMAL HIGH (ref 0.3–1.2)
Total Bilirubin: 5.8 mg/dL — ABNORMAL HIGH (ref 0.3–1.2)
Total Protein: 6.2 g/dL — ABNORMAL LOW (ref 6.5–8.1)
Total Protein: 6.3 g/dL — ABNORMAL LOW (ref 6.5–8.1)

## 2019-01-27 LAB — CBC
HCT: 31.6 % — ABNORMAL LOW (ref 39.0–52.0)
Hemoglobin: 10.1 g/dL — ABNORMAL LOW (ref 13.0–17.0)
MCH: 30.1 pg (ref 26.0–34.0)
MCHC: 32 g/dL (ref 30.0–36.0)
MCV: 94 fL (ref 80.0–100.0)
Platelets: 66 10*3/uL — ABNORMAL LOW (ref 150–400)
RBC: 3.36 MIL/uL — ABNORMAL LOW (ref 4.22–5.81)
RDW: 22.5 % — ABNORMAL HIGH (ref 11.5–15.5)
WBC: 7 10*3/uL (ref 4.0–10.5)
nRBC: 0 % (ref 0.0–0.2)

## 2019-01-27 LAB — PROTEIN / CREATININE RATIO, URINE
Creatinine, Urine: 64.78 mg/dL
Protein Creatinine Ratio: 2.16 mg/mg{Cre} — ABNORMAL HIGH (ref 0.00–0.15)
Total Protein, Urine: 140 mg/dL

## 2019-01-27 LAB — PROTIME-INR
INR: 1.4 — ABNORMAL HIGH (ref 0.8–1.2)
Prothrombin Time: 16.6 seconds — ABNORMAL HIGH (ref 11.4–15.2)

## 2019-01-27 LAB — SEDIMENTATION RATE: Sed Rate: 28 mm/hr — ABNORMAL HIGH (ref 0–16)

## 2019-01-27 LAB — BRAIN NATRIURETIC PEPTIDE: B Natriuretic Peptide: 151.4 pg/mL — ABNORMAL HIGH (ref 0.0–100.0)

## 2019-01-27 LAB — SARS CORONAVIRUS 2 (TAT 6-24 HRS): SARS Coronavirus 2: NEGATIVE

## 2019-01-27 LAB — CK
Total CK: 31136 U/L — ABNORMAL HIGH (ref 49–397)
Total CK: 20073 U/L — ABNORMAL HIGH (ref 49–397)

## 2019-01-27 LAB — MRSA PCR SCREENING: MRSA by PCR: NEGATIVE

## 2019-01-27 MED ORDER — CHLORHEXIDINE GLUCONATE 0.12 % MT SOLN
15.0000 mL | Freq: Two times a day (BID) | OROMUCOSAL | Status: DC
  Administered 2019-01-27 – 2019-02-08 (×23): 15 mL via OROMUCOSAL
  Filled 2019-01-27 (×24): qty 15

## 2019-01-27 MED ORDER — LORAZEPAM 1 MG PO TABS: 1.0000 mg | ORAL_TABLET | ORAL | Status: AC | PRN

## 2019-01-27 MED ORDER — SODIUM CHLORIDE 0.9 % IV SOLN
INTRAVENOUS | Status: DC
  Administered 2019-01-27 – 2019-02-01 (×16): via INTRAVENOUS

## 2019-01-27 MED ORDER — POTASSIUM CHLORIDE CRYS ER 20 MEQ PO TBCR
40.0000 meq | EXTENDED_RELEASE_TABLET | ORAL | Status: AC
  Administered 2019-01-27 (×2): 40 meq via ORAL
  Filled 2019-01-27 (×2): qty 2

## 2019-01-27 MED ORDER — ORAL CARE MOUTH RINSE
15.0000 mL | Freq: Two times a day (BID) | OROMUCOSAL | Status: DC
  Administered 2019-01-27 – 2019-02-07 (×16): 15 mL via OROMUCOSAL

## 2019-01-27 MED ORDER — LORAZEPAM 2 MG/ML IJ SOLN: 1.0000 mg | INTRAMUSCULAR | Status: AC | PRN

## 2019-01-27 NOTE — Progress Notes (Signed)
PROGRESS NOTE  Gregory Hansen ZOX:[id number] DOB: [birth year]   PCP: [name], DO  Patient is from: Home.  Independently ambulates at baseline.  Lives with his son.  DOA: 01/26/2019 LOS: 1  Brief Narrative / Interim history: 68 year old male with history of CAD/MI s/p CABG x4 in 2015, ankylosing spondylitis, HTN, DM-2, alcohol use disorder, depression, GERD and peripheral neuropathy brought to ED by EMS after found down at home by [employer].  Reportedly felt weak and lightheaded and fell walking up the steps at home and could not get up for about 30 minutes until the [employer] found him down.  Denies hitting his head or loss of consciousness.  Reportedly had lower extremity weakness, muscle pains and fatigue for sometimes.  Nausea but no emesis.  Endorses 3-4 shots of bourbon per day.  No history of DT or alcohol withdrawal seizure.  Denies smoking cigarettes or drug use.  Denies Tylenol.  In ED, afebrile and HDS.  Hgb 10.6.  Platelets 61.  Sodium 128.  CO2 17.  K3.4.  Mg 1.5.  Cr 2.92.  BUN 39.  AST 1632.  ALT 374.  ALP 246.  CK 33,000.  LDH 1200.  High-sensitivity troponin 26.  EKG NSR with nonspecific IVCD but no acute ischemic finding.  CT lumbar spine revealed acute burst fracture of L1 without body height loss of spinal cord involvement.  Neurosurgery consulted and recommended TLSO and outpatient follow-up in 2 weeks.  Started on aggressive IV fluids hydration and electrolyte replacement and admitted for fall, rhabdo, AKI, elevated liver enzymes, hyponatremia and lumbar fracture.  Subjective: No major events overnight of this morning.  Continues to endorse low back pain. Reports good urine output. Denies chest pain, dyspnea, palpitation, headache or focal neuro symptoms other than his chronic bilateral neuropathic pain and subacute lower extremity weakness.Denies bowel or bladder issue.  Denies history of cancer.  Objective: Vitals:   01/26/19 2107 01/27/19 0500  01/27/19 0645 01/27/19 1326  BP:   (!) 143/70 (!) 144/79  Pulse:   76 74  Resp:   18 19  Temp:   97.7 F (36.5 C) 98.3 F (36.8 C)  TempSrc:   Oral Oral  SpO2:   96% 97%  Weight: 119.1 kg 118.9 kg    Height: '6\' 2"'  (1.88 m)       Intake/Output Summary (Last 24 hours) at 01/27/2019 1505 Last data filed at 01/27/2019 1342 Gross per 24 hour  Intake 3120.75 ml  Output 900 ml  Net 2220.75 ml   Filed Weights   01/26/19 2107 01/27/19 0500  Weight: 119.1 kg 118.9 kg    Examination:  GENERAL: No acute distress.  Appears well.  HEENT: MMM.  Vision and hearing grossly intact.  NECK: Supple.  No apparent JVD.  RESP:  No IWOB. Good air movement bilaterally. CVS:  RRR. Heart sounds normal.  ABD/GI/GU: Bowel sounds present. Soft. Non tender.  MSK/EXT:  No apparent deformity or edema. Moves extremities. SKIN: Some bruising over his knees bilaterally.  Mild bruise over medial aspect of left thigh NEURO: Awake, alert and oriented appropriately.  No gross deficit.  PSYCH: Calm. Normal affect.  Assessment & Plan: Fall at home/L1 fracture/Traumatic rhabdomyolysis: reportedly felt weak, lightheaded and fell.  No head impaction or LOC. -Alcohol could have contributed to his fall in addition to his underlying lower extremity weakness and muscle pain -CK elevated to 33,000 on admission-continue trending -L1 burst fracture without body height loss or spinal cord involvement-TLSO and outpatient follow-up in  2 wks per NS  -Continue aggressive IV fluid hydration-no cardiopulmonary symptoms. -History suspicious for statin intolerance-he is on very high-dose-could benefit from PCSK9 inhibitors -As needed pain meds with good bowel regimen for pain control  Mild troponin/BNP elevation: Likely delayed clearance from renal failure.  EKG NSR with nonspecific IVCD but no ischemic finding -No work-up or follow-up needed -Closely monitor for fluid status given history of significant CAD  Bilateral lower  extremity weakness/muscle pain: suspect this to be due to statin.  His alcohol could contribute -Check CRP, ESR and aldolase for completeness  AKI with azotemia: Cr 1.0 (baseline)> 2.92 (admit)> 2.85-suspect ATN due to pigment from rhabdo.  BUN 39. -Continue aggressive hydration -Continue monitoring -We will consult nephrology if no significant improvement  Elevated liver enzymes/elevated ALP/hepatic steatosis: Likely due to rhabdo and alcohol.  RUQ [place] with hepatic steatosis.  Statin could have contributed -Discussed about cessation counseling for alcohol-he is confident about this -Treat rhabdo with IV fluid as above -Would avoid statin -Continue monitoring -Discussed with [facility], Dr. Dorris Carnes suggested acute hepatitis panel for completeness and consult if no improvement  Alcohol use disorder: Reports drinking at least 3-4 bourbons a day.  -Encourage cessation-determined to quit -Continue CIWA protocol with Ativan -Multivitamins, thiamine and folic acid  Non-anion gap metabolic acidosis: Likely due to renal failure -Continue monitoring  Hyponatremia: Likely beer potomania from alcohol-improving -Change LR to NS -Continue monitoring  Hypokalemia/hypomagnesemia -Replenish and recheck  Elevated PT/INR: Likely due to acute hepatitis -Continue monitoring  Anemia of chronic disease: Hgb 11-12 (baseline)> 10.6 admit> 10.1.  Denies melena or hematochezia. -Continue monitoring -Anemia panel  History of CAD/CABG x4 in 2015:lightheadedness and weakness likely from alcohol.  No cardiopulmonary symptoms now.  Mild troponin and BNP elevation likely delayed clearance.  EKG NSR with nonspecific IVCD but no ischemic finding -Continue monitoring -Continue home cardiac meds except statin-suspect intolerance -Outpatient cardiology follow-up for PCSK9 inhibitors  Controlled DM-2: A1c 5.1%. -Continue CBG monitoring and SSI  Essential hypertension: SBP slightly elevated but improving -Hold  home losartan in the setting of AKI -Continue home metoprolol -Add as needed labetalol  History of ankylosing spondylitis:  Depression: -Continue home Wellbutrin and SSRI.               DVT prophylaxis: Subcu heparin Code Status: Full code Family Communication: Patient and/or RN. Available if any question. Disposition Plan: Remains inpatient Consultants: None  Procedures:  None  Microbiology summarized: [id number] negative. MRSA PCR negative.  Sch Meds:  Scheduled Meds: . buPROPion  150 mg Oral QODAY  . chlorhexidine  15 mL Mouth Rinse BID  . docusate sodium  100 mg Oral BID  . fluticasone  1 spray Each Nare Daily  . folic acid  1 mg Oral Daily  . heparin injection (subcutaneous)  5,000 Units Subcutaneous Q8H  . insulin aspart  0-9 Units Subcutaneous TID WC  . mouth rinse  15 mL Mouth Rinse q12n4p  . metoprolol succinate  50 mg Oral Daily  . multivitamin with minerals  1 tablet Oral Daily  . pantoprazole  40 mg Oral Daily  . polyethylene glycol  17 g Oral Daily  . senna  1 tablet Oral BID  . thiamine  100 mg Oral Daily  . vitamin B-12  1,000 mcg Oral Daily   Continuous Infusions: . sodium chloride 200 mL/hr at 01/27/19 1344   PRN Meds:.ondansetron **OR** ondansetron (ZOFRAN) IV, oxyCODONE  Antimicrobials: Anti-infectives (From admission, onward)   None  I have personally reviewed the following labs and images: CBC: Recent Labs  Lab 01/26/19 1702 01/27/19 0523  WBC 6.4 7.0  NEUTROABS 5.1  --   HGB 10.6* 10.1*  HCT 32.4* 31.6*  MCV 91.3 94.0  PLT 61* 66*   BMP &GFR Recent Labs  Lab 01/26/19 1702 01/26/19 2139 01/27/19 0523  NA 128*  --  131*  K 3.6  --  3.4*  CL 99  --  102  CO2 17*  --  19*  GLUCOSE 105*  --  101*  BUN 39*  --  39*  CREATININE 2.92*  --  2.85*  CALCIUM 7.4*  --  7.4*  MG  --  1.5*  --   PHOS  --  3.4  --    Estimated Creatinine Clearance: 33.5 mL/min (A) (by C-G formula based on SCr of 2.85 mg/dL (H)). Liver  & Pancreas: Recent Labs  Lab 01/26/19 1702 01/27/19 0523  AST 1,632* 1,643*  ALT 374* 367*  ALKPHOS 246* 226*  BILITOT 6.3* 5.3*  PROT 6.6 6.2*  ALBUMIN 2.4* 2.2*   No results for input(s): LIPASE, AMYLASE in the last 168 hours. No results for input(s): AMMONIA in the last 168 hours. Diabetic: Recent Labs    01/26/19 1953  HGBA1C 5.1   Recent Labs  Lab 01/27/19 0117 01/27/19 0758 01/27/19 1210  GLUCAP 95 89 111*   Cardiac Enzymes: Recent Labs  Lab 01/26/19 2139 01/27/19 0523  CKTOTAL 32,119* 31,136*   No results for input(s): PROBNP in the last 8760 hours. Coagulation Profile: Recent Labs  Lab 01/26/19 2139 01/27/19 0523  INR 1.4* 1.4*   Thyroid Function Tests: No results for input(s): TSH, T4TOTAL, FREET4, T3FREE, THYROIDAB in the last 72 hours. Lipid Profile: No results for input(s): CHOL, HDL, LDLCALC, TRIG, CHOLHDL, LDLDIRECT in the last 72 hours. Anemia Panel: Recent Labs    01/26/19 2139  FERRITIN 494*  TIBC 249*  IRON 167  RETICCTPCT 1.9   Urine analysis:    Component Value Date/Time   COLORURINE YELLOW 01/26/2019 2242   APPEARANCEUR CLEAR 01/26/2019 2242   LABSPEC 1.008 01/26/2019 2242   PHURINE 6.0 01/26/2019 2242   GLUCOSEU NEGATIVE 01/26/2019 2242   HGBUR LARGE (A) 01/26/2019 2242   [place] NEGATIVE 01/26/2019 2242   KETONESUR NEGATIVE 01/26/2019 2242   PROTEINUR 100 (A) 01/26/2019 2242   UROBILINOGEN 1.0 10/28/2013 1857   NITRITE NEGATIVE 01/26/2019 2242   LEUKOCYTESUR NEGATIVE 01/26/2019 2242   Sepsis Labs: Invalid input(s): PROCALCITONIN, [place]  Microbiology: Recent Results (from the past 240 hour(s))  SARS CORONAVIRUS 2 (TAT 6-24 HRS) Nasopharyngeal Nasopharyngeal Swab     Status: None   Collection Time: 01/26/19  7:16 PM   Specimen: Nasopharyngeal Swab  Result Value Ref Range Status   SARS Coronavirus 2 NEGATIVE NEGATIVE Final    Comment: (NOTE) SARS-CoV-2 target nucleic acids are NOT DETECTED. The SARS-CoV-2  RNA is generally detectable in upper and lower respiratory specimens during the acute phase of infection. Negative results do not preclude SARS-CoV-2 infection, do not rule out co-infections with other pathogens, and should not be used as the sole basis for treatment or other patient management decisions. Negative results must be combined with clinical observations, patient history, and epidemiological information. The expected result is Negative. Fact Sheet for Patients: SugarRoll.be Fact Sheet for Healthcare Providers: https://www.woods-mathews.com/ This test is not yet approved or cleared by the [place] FDA and  has been authorized for detection and/or diagnosis of SARS-CoV-2 by FDA under an Emergency  Use Authorization (EUA). This EUA will remain  in effect (meaning this test can be used) for the duration of the COVID-19 declaration under Section 56 4(b)(1) of the Act, 21 U.S.C. section 360bbb-3(b)(1), unless the authorization is terminated or revoked sooner. Performed at [facility], [place] [address], [place], [place] [address]   MRSA PCR Screening     Status: None   Collection Time: 01/27/19 10:00 AM   Specimen: Nasal Mucosa; Nasopharyngeal  Result Value Ref Range Status   MRSA by PCR NEGATIVE NEGATIVE Final    Comment:        The GeneXpert MRSA Assay (FDA approved for NASAL specimens only), is one component of a comprehensive MRSA colonization surveillance program. It is not intended to diagnose MRSA infection nor to guide or monitor treatment for MRSA infections. Performed at [facility], [place] [address] [place], [place] [address]     Radiology Studies: Ct Lumbar Spine Wo Contrast  Result Date: 01/26/2019 CLINICAL DATA:  Lumbosacral spine fracture, traumatic. Additional history provided: Patient fell on back from 2 steps up today. Low back pain. Generalized weakness. EXAM: CT LUMBAR SPINE  WITHOUT CONTRAST TECHNIQUE: Multidetector CT imaging of the lumbar spine was performed without intravenous contrast administration. Multiplanar CT image reconstructions were also generated. COMPARISON:  CT abdomen/pelvis 11/22/2016. FINDINGS: Segmentation: 5 lumbar vertebrae. Alignment: Lumbar levocurvature. Straightening of the expected lumbar lordosis. No significant spondylolisthesis. Vertebrae: Acute predominantly horizontally oriented fracture of the L1 vertebral body involving both the anterior and middle columns. Fracture lucencies extend through the posterior aspect of the vertebra (series 10, image 43). Fracture lucencies also extend to the superior endplate and into the left L1 pedicle (series 4, image 39). No significant bony retropulsion. There is little if any L1 vertebral body height loss. No other acute fracture identified. Redemonstrated mild chronic T12 superior endplate deformity with superimposed Schmorl node. Redemonstrated fusion of bilateral sacroiliac joints. Paraspinal and other soft tissues: Paraspinal soft tissues within normal limits. Aortoiliac atherosclerosis. Disc levels: No high-grade bony spinal canal or neural foraminal narrowing at any level. IMPRESSION: 1. Acute predominantly horizontally oriented fracture of the L1 vertebral body. Fracture lucencies extend to the L1 superior endplate, through the posterior margin of the vertebra and into the left L1 pedicle. There is little, if any, L1 vertebral body height loss. No significant bony retropulsion. 2. Redemonstrated mild chronic T12 superior endplate deformity. Electronically Signed   By: Kellie Simmering DO   On: 01/26/2019 17:36   US Liver Doppler  Result Date: 01/27/2019 CLINICAL DATA:  Acute hepatitis. EXAM: DUPLEX ULTRASOUND OF LIVER TECHNIQUE: Color and duplex Doppler ultrasound was performed to evaluate the hepatic in-flow and out-flow vessels. Exam was limited due to bowel gas and body habitus. COMPARISON:  November 22, 2016. FINDINGS: Liver: Increased echogenicity of hepatic parenchyma is noted consistent with hepatic steatosis. Normal hepatic contour without nodularity. No focal lesion, mass or intrahepatic biliary ductal dilatation. Main Portal Vein size: 1.2 cm Portal Vein Velocities Main Prox:  29.1 cm/sec Main Mid: 32.0 cm/sec Main Dist:  34.0 cm/sec Right: 27.8 cm/sec Left: 37.0 cm/sec Normal hepatopetal flow is noted in the portal veins. Hepatic Vein Velocities Right:  24.1 cm/sec Middle:  27.0 cm/sec Left:  16.7 cm/sec Normal hepatofugal flow is noted in the hepatic veins. IVC: Present and patent with normal respiratory phasicity. Hepatic Artery Velocity:  33.4 cm/sec Splenic Vein Velocity:  Not visualized. Spleen: 8.7 cm x 13.6 cm x 6.6 cm with a total volume of 412  cm^3 (211 cm^3 is upper limit normal) Portal Vein Occlusion/Thrombus: No Splenic Vein Occlusion/Thrombus: Splenic vein not visualized. Ascites: None Varices: None IMPRESSION: There is no definite evidence of hepatic or portal venous thrombosis or occlusion. The splenic vein was not visualized and therefore thrombosis or occlusion of this structure cannot be excluded. Minimal splenomegaly is noted. Increased echogenicity of hepatic parenchyma is noted suggesting hepatic steatosis. Electronically Signed   By: Marijo Conception M.D.   On: 01/27/2019 07:07   45 minutes with more than 50% spent in reviewing records, counseling patient/family and coordinating care.  Taye T. Moulton  If 7PM-7AM, please contact night-coverage [email or web address] Password [email or web address] 01/27/2019, 3:05 PM

## 2019-01-28 LAB — COMPREHENSIVE METABOLIC PANEL
ALT: 325 U/L — ABNORMAL HIGH (ref 0–44)
AST: 1174 U/L — ABNORMAL HIGH (ref 15–41)
Albumin: 2.1 g/dL — ABNORMAL LOW (ref 3.5–5.0)
Alkaline Phosphatase: 221 U/L — ABNORMAL HIGH (ref 38–126)
Anion gap: 9 (ref 5–15)
BUN: 38 mg/dL — ABNORMAL HIGH (ref 8–23)
CO2: 16 mmol/L — ABNORMAL LOW (ref 22–32)
Calcium: 6.9 mg/dL — ABNORMAL LOW (ref 8.9–10.3)
Chloride: 106 mmol/L (ref 98–111)
Creatinine, Ser: 2.41 mg/dL — ABNORMAL HIGH (ref 0.61–1.24)
GFR calc Af Amer: 31 mL/min — ABNORMAL LOW (ref >60)
GFR calc non Af Amer: 26 mL/min — ABNORMAL LOW (ref >60)
Glucose, Bld: 101 mg/dL — ABNORMAL HIGH (ref 70–99)
Potassium: 3.6 mmol/L (ref 3.5–5.1)
Sodium: 131 mmol/L — ABNORMAL LOW (ref 135–145)
Total Bilirubin: 4.6 mg/dL — ABNORMAL HIGH (ref 0.3–1.2)
Total Protein: 6 g/dL — ABNORMAL LOW (ref 6.5–8.1)

## 2019-01-28 LAB — CBC
HCT: 31.1 % — ABNORMAL LOW (ref 39.0–52.0)
Hemoglobin: 10 g/dL — ABNORMAL LOW (ref 13.0–17.0)
MCH: 30.9 pg (ref 26.0–34.0)
MCHC: 32.2 g/dL (ref 30.0–36.0)
MCV: 96 fL (ref 80.0–100.0)
Platelets: 71 10*3/uL — ABNORMAL LOW (ref 150–400)
RBC: 3.24 MIL/uL — ABNORMAL LOW (ref 4.22–5.81)
RDW: 23 % — ABNORMAL HIGH (ref 11.5–15.5)
WBC: 6.1 10*3/uL (ref 4.0–10.5)
nRBC: 0 % (ref 0.0–0.2)

## 2019-01-28 LAB — CK: Total CK: 12039 U/L — ABNORMAL HIGH (ref 49–397)

## 2019-01-28 LAB — GLUCOSE, CAPILLARY
Glucose-Capillary: 99 mg/dL (ref 70–99)
Glucose-Capillary: 121 mg/dL — ABNORMAL HIGH (ref 70–99)
Glucose-Capillary: 94 mg/dL (ref 70–99)
Glucose-Capillary: 97 mg/dL (ref 70–99)

## 2019-01-28 LAB — C-REACTIVE PROTEIN: CRP: 14.5 mg/dL — ABNORMAL HIGH (ref <1.0)

## 2019-01-28 LAB — PROTIME-INR
INR: 1.4 — ABNORMAL HIGH (ref 0.8–1.2)
Prothrombin Time: 17.2 seconds — ABNORMAL HIGH (ref 11.4–15.2)

## 2019-01-28 LAB — PHOSPHORUS: Phosphorus: 2.7 mg/dL (ref 2.5–4.6)

## 2019-01-28 LAB — MAGNESIUM: Magnesium: 1.3 mg/dL — ABNORMAL LOW (ref 1.7–2.4)

## 2019-01-28 LAB — ALDOLASE: Aldolase: 130.5 U/L — ABNORMAL HIGH (ref 3.3–10.3)

## 2019-01-28 MED ORDER — MAGNESIUM SULFATE 4 GM/100ML IV SOLN
4.0000 g | Freq: Once | INTRAVENOUS | Status: AC
  Administered 2019-01-28: 09:00:00 4 g via INTRAVENOUS
  Filled 2019-01-28: qty 100

## 2019-01-28 MED ORDER — POTASSIUM CHLORIDE CRYS ER 20 MEQ PO TBCR
40.0000 meq | EXTENDED_RELEASE_TABLET | Freq: Once | ORAL | Status: AC
  Administered 2019-01-28: 10:00:00 40 meq via ORAL
  Filled 2019-01-28: qty 2

## 2019-01-28 NOTE — Progress Notes (Signed)
Orthopedic Tech Progress Note Patient Details:  Gregory Hansen Nov 17, 1950 [id number]  Patient ID: Gregory Hansen, male   DOB: [birth year], 68 y.o.   MRN: [id number]   [name] 01/28/2019, 11:40 AMCalled [facility] for TLSO brace.

## 2019-01-28 NOTE — Progress Notes (Signed)
PROGRESS NOTE  Gregory Hansen QXI:[id number] DOB: [birth year]   PCP: [name], DO  Patient is from: Home.  Independently ambulates at baseline.  Lives with his son.  DOA: 01/26/2019 LOS: 2  Brief Narrative / Interim history: 68 year old male with history of CAD/MI s/p CABG x4 in 2015, ankylosing spondylitis, HTN, DM-2, alcohol use disorder, depression, GERD and peripheral neuropathy brought to ED by EMS after found down at home by [employer].  Reportedly felt weak and lightheaded and fell walking up the steps at home and could not get up for about 30 minutes until the [employer] found him down.  Denies hitting his head or loss of consciousness.  Reportedly had lower extremity weakness, muscle pains and fatigue for sometimes.  Nausea but no emesis.  Endorses 3-4 shots of bourbon per day.  No history of DT or alcohol withdrawal seizure.  Denies smoking cigarettes or drug use.  Denies Tylenol.  In ED, afebrile and HDS.  Hgb 10.6.  Platelets 61.  Sodium 128.  CO2 17.  K3.4.  Mg 1.5.  Cr 2.92.  BUN 39.  AST 1632.  ALT 374.  ALP 246.  CK 33,000.  LDH 1200.  High-sensitivity troponin 26.  EKG NSR with nonspecific IVCD but no acute ischemic finding.  CT lumbar spine revealed acute burst fracture of L1 without body height loss of spinal cord involvement.  Neurosurgery consulted and recommended TLSO and outpatient follow-up in 2 weeks.  Started on aggressive IV fluids hydration and electrolyte replacement and admitted for fall, rhabdo, AKI, elevated liver enzymes, hyponatremia and lumbar fracture.  Subjective: No major events overnight of this morning.  Had a fair night.  Reports low back pain.  No focal neuro deficit other than chronic neuropathy and weakness in his legs.  Denies bowel or bladder issue.  Has Foley catheter in place.   Objective: Vitals:   01/27/19 1326 01/27/19 2136 01/28/19 0622 01/28/19 1209  BP: (!) 144/79 139/76 (!) 149/80 136/68  Pulse: 74 71 73 76  Resp: _0 Temp: 98.3 F (36.8 C)  97.7 F (36.5 C) 97.9 F (36.6 C)  TempSrc: Oral  Oral Oral  SpO2: 97% 96% 97% 96%  Weight:      Height:        Intake/Output Summary (Last 24 hours) at 01/28/2019 1247 Last data filed at 01/28/2019 1035 Gross per 24 hour  Intake 3589.96 ml  Output 1701 ml  Net 1888.96 ml   Filed Weights   01/26/19 2107 01/27/19 0500  Weight: 119.1 kg 118.9 kg    Examination:  GENERAL: No acute distress.  Appears well.  HEENT: MMM.  Vision and hearing grossly intact.  NECK: Supple.  No apparent JVD.  RESP:  No IWOB. Good air movement bilaterally. CVS:  RRR. Heart sounds normal.  ABD/GI/GU: Bowel sounds present. Soft. Non tender.  Dark red urine in a bag MSK/EXT:  Moves extremities. No apparent deformity or edema.  SKIN: no apparent skin lesion or wound NEURO: Awake, alert and oriented appropriately.  No apparent focal neuro deficit. PSYCH: Calm. Normal affect.   Assessment & Plan: Fall at home/L1 fracture/Traumatic rhabdomyolysis: reportedly felt weak, lightheaded and fell.  No head impaction or LOC. -Alcohol could have contributed to his fall in addition to his underlying lower extremity weakness and muscle pain -CK 33,000 (admit)> 12,000 -L1 burst fracture without body height loss or spinal cord involvement-TLSO and outpatient follow-up in 2 wks per NS  -Continue aggressive IV fluid hydration-no  cardiopulmonary symptoms. -History suspicious for statin intolerance-he is on very high-dose-could benefit from PCSK9 inhibitors -As needed pain meds with good bowel regimen for pain control  Mild troponin/BNP elevation: Likely delayed clearance from renal failure.  EKG NSR with nonspecific IVCD but no ischemic finding.  No chest pain, dyspnea or orthopnea.  No signs of fluid overload. -No work-up or follow-up needed -Closely monitor for fluid status given history of significant CAD  Bilateral lower extremity weakness/muscle pain: suspect this to be due to  statin.  His alcohol could contribute.  CRP, ESR and aldolase elevated.  -Recommend rechecking outpatient once rhabdo resolves.  AKI with azotemia: Cr 1.0 (baseline)> 2.92 (admit)> 2.85> 2.41-suspect ATN due to pigment from rhabdo.  About 1 L urine output.  Improving. -Continue aggressive hydration -Continue monitoring  Elevated liver enzymes/elevated ALP/hyperbilirubinemia/hepatic steatosis: Likely due to rhabdo and alcohol.  RUQ [place] with hepatic steatosis.  Statin could have contributed-improving. -Discussed about cessation counseling for alcohol-he is confident about this -Treat rhabdo with IV fluid as above -Would avoid statin -Continue monitoring -Discussed with [facility], Dr. Dorris Carnes suggested acute hepatitis panel for completeness and consult if no improvement  Alcohol use disorder: Reports drinking at least 3-4 bourbons a day.  -Encourage cessation-determined to quit -Continue CIWA protocol with Ativan -Multivitamins, thiamine and folic acid  Non-anion gap metabolic acidosis: Likely due to renal failure -Continue monitoring-will initiate sodium bicarb if no improvement.  Hyponatremia: Likely beer potomania from alcohol, AKI and possibly SSRI -Continue IV NS -Continue monitoring  Hypokalemia/hypomagnesemia -Replenish and recheck  Elevated PT/INR/coagulopathy: Likely due to acute hepatitis.  Stable. -Continue monitoring  Anemia of chronic disease: Hgb 11-12 (baseline)> 10.6 admit> 10.1.  Denies melena or hematochezia.  Stable. -Continue monitoring  History of CAD/CABG x4 in 2015:lightheadedness and weakness likely from alcohol.  No cardiopulmonary symptoms now.  Mild troponin and BNP elevation likely delayed clearance.  EKG NSR with nonspecific IVCD but no ischemic finding -Continue monitoring -Continue home cardiac meds except statin-suspect intolerance -Outpatient cardiology follow-up for PCSK9 inhibitors  Controlled DM-2: A1c 5.1%.  CBG within fair range.  Has not  needed SSI. -Discontinue CBG monitoring and SSI  Essential hypertension: SBP slightly elevated but improving.  Partly due to IV fluid and pain.. -Hold home losartan in the setting of AKI -Continue home metoprolol and as needed labetalol -Pain control  History of ankylosing spondylitis: Not on medications. -Outpatient follow-up  Depression: Stable -Continue home Wellbutrin and SSRI.               DVT prophylaxis: Subcu heparin Code Status: Full code Family Communication: Patient and/or RN. Available if any question. Disposition Plan: Remains inpatient Consultants: None  Procedures:  None  Microbiology summarized: [id number] negative. MRSA PCR negative.  Sch Meds:  Scheduled Meds: . buPROPion  150 mg Oral QODAY  . chlorhexidine  15 mL Mouth Rinse BID  . docusate sodium  100 mg Oral BID  . fluticasone  1 spray Each Nare Daily  . folic acid  1 mg Oral Daily  . heparin injection (subcutaneous)  5,000 Units Subcutaneous Q8H  . insulin aspart  0-9 Units Subcutaneous TID WC  . mouth rinse  15 mL Mouth Rinse q12n4p  . metoprolol succinate  50 mg Oral Daily  . multivitamin with minerals  1 tablet Oral Daily  . pantoprazole  40 mg Oral Daily  . polyethylene glycol  17 g Oral Daily  . senna  1 tablet Oral BID  . thiamine  100 mg Oral Daily  .  vitamin B-12  1,000 mcg Oral Daily   Continuous Infusions: . sodium chloride 150 mL/hr at 01/28/19 1132   PRN Meds:.LORazepam **OR** LORazepam, ondansetron **OR** ondansetron (ZOFRAN) IV, oxyCODONE  Antimicrobials: Anti-infectives (From admission, onward)   None       I have personally reviewed the following labs and images: CBC: Recent Labs  Lab 01/26/19 1702 01/27/19 0523 01/28/19 0550  WBC 6.4 7.0 6.1  NEUTROABS 5.1  --   --   HGB 10.6* 10.1* 10.0*  HCT 32.4* 31.6* 31.1*  MCV 91.3 94.0 96.0  PLT 61* 66* 71*   BMP &GFR Recent Labs  Lab 01/26/19 1702 01/26/19 2139 01/27/19 0523 01/27/19 1549 01/28/19 0550   NA 128*  --  131* 130* 131*  K 3.6  --  3.4* 3.6 3.6  CL 99  --  102 104 106  CO2 17*  --  19* 19* 16*  GLUCOSE 105*  --  101* 105* 101*  BUN 39*  --  39* 40* 38*  CREATININE 2.92*  --  2.85* 2.85* 2.41*  CALCIUM 7.4*  --  7.4* 7.0* 6.9*  MG  --  1.5*  --   --  1.3*  PHOS  --  3.4  --   --  2.7   Estimated Creatinine Clearance: 39.6 mL/min (A) (by C-G formula based on SCr of 2.41 mg/dL (H)). Liver & Pancreas: Recent Labs  Lab 01/26/19 1702 01/27/19 0523 01/27/19 1549 01/28/19 0550  AST 1,632* 1,643* 1,471* 1,174*  ALT 374* 367* 349* 325*  ALKPHOS 246* 226* 228* 221*  BILITOT 6.3* 5.3* 5.8* 4.6*  PROT 6.6 6.2* 6.3* 6.0*  ALBUMIN 2.4* 2.2* 2.3* 2.1*   No results for input(s): LIPASE, AMYLASE in the last 168 hours. No results for input(s): AMMONIA in the last 168 hours. Diabetic: Recent Labs    01/26/19 1953  HGBA1C 5.1   Recent Labs  Lab 01/27/19 1210 01/27/19 1639 01/27/19 2131 01/28/19 0750 01/28/19 1156  GLUCAP 111* 88 98 99 121*   Cardiac Enzymes: Recent Labs  Lab 01/26/19 2139 01/27/19 0523 01/27/19 1549 01/28/19 0550  CKTOTAL 32,119* 31,136* 20,073* 12,039*   No results for input(s): PROBNP in the last 8760 hours. Coagulation Profile: Recent Labs  Lab 01/26/19 2139 01/27/19 0523 01/28/19 0550  INR 1.4* 1.4* 1.4*   Thyroid Function Tests: No results for input(s): TSH, T4TOTAL, FREET4, T3FREE, THYROIDAB in the last 72 hours. Lipid Profile: No results for input(s): CHOL, HDL, LDLCALC, TRIG, CHOLHDL, LDLDIRECT in the last 72 hours. Anemia Panel: Recent Labs    01/26/19 2139  FERRITIN 494*  TIBC 249*  IRON 167  RETICCTPCT 1.9   Urine analysis:    Component Value Date/Time   COLORURINE YELLOW 01/26/2019 2242   APPEARANCEUR CLEAR 01/26/2019 2242   LABSPEC 1.008 01/26/2019 2242   PHURINE 6.0 01/26/2019 2242   GLUCOSEU NEGATIVE 01/26/2019 2242   HGBUR LARGE (A) 01/26/2019 2242   [place] NEGATIVE 01/26/2019 2242   KETONESUR NEGATIVE  01/26/2019 2242   PROTEINUR 100 (A) 01/26/2019 2242   UROBILINOGEN 1.0 10/28/2013 1857   NITRITE NEGATIVE 01/26/2019 2242   LEUKOCYTESUR NEGATIVE 01/26/2019 2242   Sepsis Labs: Invalid input(s): PROCALCITONIN, [place]  Microbiology: Recent Results (from the past 240 hour(s))  SARS CORONAVIRUS 2 (TAT 6-24 HRS) Nasopharyngeal Nasopharyngeal Swab     Status: None   Collection Time: 01/26/19  7:16 PM   Specimen: Nasopharyngeal Swab  Result Value Ref Range Status   SARS Coronavirus 2 NEGATIVE NEGATIVE Final    Comment: (  NOTE) SARS-CoV-2 target nucleic acids are NOT DETECTED. The SARS-CoV-2 RNA is generally detectable in upper and lower respiratory specimens during the acute phase of infection. Negative results do not preclude SARS-CoV-2 infection, do not rule out co-infections with other pathogens, and should not be used as the sole basis for treatment or other patient management decisions. Negative results must be combined with clinical observations, patient history, and epidemiological information. The expected result is Negative. Fact Sheet for Patients: SugarRoll.be Fact Sheet for Healthcare Providers: https://www.woods-mathews.com/ This test is not yet approved or cleared by the [place] FDA and  has been authorized for detection and/or diagnosis of SARS-CoV-2 by FDA under an Emergency Use Authorization (EUA). This EUA will remain  in effect (meaning this test can be used) for the duration of the COVID-19 declaration under Section 56 4(b)(1) of the Act, 21 U.S.C. section 360bbb-3(b)(1), unless the authorization is terminated or revoked sooner. Performed at [facility], [place] [address] [place], [place] [address]   MRSA PCR Screening     Status: None   Collection Time: 01/27/19 10:00 AM   Specimen: Nasal Mucosa; Nasopharyngeal  Result Value Ref Range Status   MRSA by PCR NEGATIVE NEGATIVE Final    Comment:        The  GeneXpert MRSA Assay (FDA approved for NASAL specimens only), is one component of a comprehensive MRSA colonization surveillance program. It is not intended to diagnose MRSA infection nor to guide or monitor treatment for MRSA infections. Performed at [facility], [place] [address] [place],  [address]     Radiology Studies: No results found.  Taye T. [place]  If 7PM-7AM, please contact night-coverage [email or web address] Password [facility] 01/28/2019, 12:47 PM

## 2019-01-29 LAB — MAGNESIUM: Magnesium: 1.6 mg/dL — ABNORMAL LOW (ref 1.7–2.4)

## 2019-01-29 LAB — COMPREHENSIVE METABOLIC PANEL
ALT: 302 U/L — ABNORMAL HIGH (ref 0–44)
AST: 887 U/L — ABNORMAL HIGH (ref 15–41)
Albumin: 2.2 g/dL — ABNORMAL LOW (ref 3.5–5.0)
Alkaline Phosphatase: 231 U/L — ABNORMAL HIGH (ref 38–126)
Anion gap: 8 (ref 5–15)
BUN: 32 mg/dL — ABNORMAL HIGH (ref 8–23)
CO2: 17 mmol/L — ABNORMAL LOW (ref 22–32)
Calcium: 7.1 mg/dL — ABNORMAL LOW (ref 8.9–10.3)
Chloride: 110 mmol/L (ref 98–111)
Creatinine, Ser: 1.83 mg/dL — ABNORMAL HIGH (ref 0.61–1.24)
GFR calc Af Amer: 43 mL/min — ABNORMAL LOW (ref >60)
GFR calc non Af Amer: 37 mL/min — ABNORMAL LOW (ref >60)
Glucose, Bld: 107 mg/dL — ABNORMAL HIGH (ref 70–99)
Potassium: 3.6 mmol/L (ref 3.5–5.1)
Sodium: 135 mmol/L (ref 135–145)
Total Bilirubin: 4.2 mg/dL — ABNORMAL HIGH (ref 0.3–1.2)
Total Protein: 6 g/dL — ABNORMAL LOW (ref 6.5–8.1)

## 2019-01-29 LAB — CBC
HCT: 32.6 % — ABNORMAL LOW (ref 39.0–52.0)
Hemoglobin: 10.1 g/dL — ABNORMAL LOW (ref 13.0–17.0)
MCH: 30.1 pg (ref 26.0–34.0)
MCHC: 31 g/dL (ref 30.0–36.0)
MCV: 97 fL (ref 80.0–100.0)
Platelets: 89 10*3/uL — ABNORMAL LOW (ref 150–400)
RBC: 3.36 MIL/uL — ABNORMAL LOW (ref 4.22–5.81)
RDW: 23.9 % — ABNORMAL HIGH (ref 11.5–15.5)
WBC: 7.5 10*3/uL (ref 4.0–10.5)
nRBC: 0 % (ref 0.0–0.2)

## 2019-01-29 LAB — GLUCOSE, CAPILLARY
Glucose-Capillary: 101 mg/dL — ABNORMAL HIGH (ref 70–99)
Glucose-Capillary: 122 mg/dL — ABNORMAL HIGH (ref 70–99)
Glucose-Capillary: 90 mg/dL (ref 70–99)
Glucose-Capillary: 98 mg/dL (ref 70–99)

## 2019-01-29 LAB — HAPTOGLOBIN: Haptoglobin: 70 mg/dL (ref 32–363)

## 2019-01-29 LAB — PANEL 083904
HIV 1 AB: NEGATIVE
HIV 2 AB: NEGATIVE
Note: NEGATIVE

## 2019-01-29 LAB — CK: Total CK: 7304 U/L — ABNORMAL HIGH (ref 49–397)

## 2019-01-29 MED ORDER — MAGNESIUM SULFATE 4 GM/100ML IV SOLN
4.0000 g | Freq: Once | INTRAVENOUS | Status: AC
  Administered 2019-01-29: 10:00:00 4 g via INTRAVENOUS
  Filled 2019-01-29: qty 100

## 2019-01-29 MED ORDER — POTASSIUM CHLORIDE CRYS ER 20 MEQ PO TBCR
40.0000 meq | EXTENDED_RELEASE_TABLET | Freq: Once | ORAL | Status: AC
  Administered 2019-01-29: 40 meq via ORAL
  Filled 2019-01-29: qty 2

## 2019-01-29 MED ORDER — AMLODIPINE BESYLATE 5 MG PO TABS
5.0000 mg | ORAL_TABLET | Freq: Every day | ORAL | Status: DC
  Administered 2019-01-29 – 2019-02-08 (×11): 5 mg via ORAL
  Filled 2019-01-29 (×11): qty 1

## 2019-01-29 NOTE — Progress Notes (Signed)
PROGRESS NOTE  Gregory SLIKER IOE:[id number] DOB: [birth year]   PCP: [name], DO  Patient is from: Home.  Independently ambulates at baseline.  Lives with his son.  DOA: 01/26/2019 LOS: 3  Brief Narrative / Interim history: 68 year old male with history of CAD/MI s/p CABG x4 in 2015, ankylosing spondylitis, HTN, DM-2, alcohol use disorder, depression, GERD and peripheral neuropathy brought to ED by EMS after found down at home by [employer].  Reportedly felt weak and lightheaded and fell walking up the steps at home and could not get up for about 30 minutes until the [employer] found him down.  Denies hitting his head or loss of consciousness.  Reportedly had lower extremity weakness, muscle pains and fatigue for sometimes.  Nausea but no emesis.  Endorses 3-4 shots of bourbon per day.  No history of DT or alcohol withdrawal seizure.  Denies smoking cigarettes or drug use.  Denies Tylenol.  In ED, afebrile and HDS.  Hgb 10.6.  Platelets 61.  Sodium 128.  CO2 17.  K3.4.  Mg 1.5.  Cr 2.92.  BUN 39.  AST 1632.  ALT 374.  ALP 246.  CK 33,000.  LDH 1200.  High-sensitivity troponin 26.  EKG NSR with nonspecific IVCD but no acute ischemic finding.  CT lumbar spine revealed acute burst fracture of L1 without body height loss of spinal cord involvement.  Neurosurgery consulted and recommended TLSO and outpatient follow-up in 2 weeks.  Started on aggressive IV fluids hydration and electrolyte replacement and admitted for fall, rhabdo, AKI, elevated liver enzymes, hyponatremia and lumbar fracture.  Subjective: No major events overnight of this morning.  No complaints this morning.  Reports improvement in his back pain.  Denies chest pain, dyspnea, new lower extremity weakness, numbness, tingling, bowel or bladder issue.  Objective: Vitals:   01/28/19 0622 01/28/19 1209 01/28/19 2200 01/29/19 0535  BP: (!) 149/80 136/68 (!) 150/82 (!) 159/82  Pulse: 73 76 84 81  Resp: '20 18 18 18   ' Temp: 97.7 F (36.5 C) 97.9 F (36.6 C) 98.4 F (36.9 C) 98.7 F (37.1 C)  TempSrc: Oral Oral Oral Oral  SpO2: 97% 96% 97% 100%  Weight:      Height:        Intake/Output Summary (Last 24 hours) at 01/29/2019 1325 Last data filed at 01/29/2019 0958 Gross per 24 hour  Intake 3782.87 ml  Output 2200 ml  Net 1582.87 ml   Filed Weights   01/26/19 2107 01/27/19 0500  Weight: 119.1 kg 118.9 kg    Examination:  GENERAL: No acute distress.  Appears well.  HEENT: MMM.  Vision and hearing grossly intact.  NECK: Supple.  No apparent JVD.  RESP:  No IWOB. Good air movement bilaterally. CVS:  RRR. Heart sounds normal.  ABD/GI/GU: Bowel sounds present. Soft. Non tender.  MSK/EXT:  Moves extremities. No apparent deformity or edema.  SKIN: Small scattered skin bruises over bilateral knees. NEURO: Awake, alert and oriented appropriately.  No apparent focal neuro deficit.  Motor 5/5 in both LEs. PSYCH: Calm. Normal affect.   Assessment & Plan: Fall at home/L1 fracture/Traumatic rhabdomyolysis: felt weak, lightheaded and fell.  No head impaction or LOC. -Alcohol could have contributed to his fall in addition to his underlying lower extremity weakness and muscle pain -CK 33,000 (admit)>>> 7000 -L1 burst fracture w/o body height loss or spinal cord involvement-TLSO and outpatient follow-up in 2 wks per NS  -Continue aggressive IV fluid hydration-no cardiopulmonary symptoms. -History suspicious for statin  intolerance-he is on very high-dose-could benefit from PCSK9 inhibitors -As needed pain meds with good bowel regimen for pain control  AKI with azotemia: Cr 1.0 (baseline)> 2.92 (admit)> 2.85> 1.83-suspect ATN due to rhabdo.  Good UOP. -Continue aggressive hydration -Continue monitoring  Non-anion gap metabolic acidosis: Likely due to AKI and IV fluid -Continue monitoring.  Elevated liver enzymes/elevated ALP/hyperbilirubinemia/hepatic steatosis: Likely due to rhabdo, EtOH and statin  RUQ [place] with hepatic steatosis.  Improving. -Counseled about alcohol-confident about cessation -Treat rhabdo with IV fluid as above -Would avoid statin in the future -Continue monitoring  Alcohol use disorder: Reports drinking at least 3-4 bourbons a day.  -Encourage cessation-determined to quit -Continue CIWA protocol with Ativan -Multivitamins, thiamine and folic acid  Hyponatremia: beer potomania, AKI and possibly SSRI.  Resolving -Continue IV NS -Continue monitoring  Hypokalemia/hypomagnesemia -Replenish and recheck  Elevated PT/INR/coagulopathy: Likely due to acute hepatitis.  Stable. -Continue monitoring  Anemia of chronic disease: Hgb 11-12 (baseline)> 10.6 admit> 10.1. No melena or hematochezia.  Stable. -Continue monitoring  Elevated liver enzymes/BNP/history of CAD/CABG x4 in 2015:lightheadedness and weakness likely from alcohol.  No cardiopulmonary symptoms now.  Mild troponin and BNP elevation likely delayed clearance.  EKG NSR with nonspecific IVCD but no ischemic finding -Continue monitoring -Continue home cardiac meds except statin-suspect intolerance -Outpatient cardiology follow-up for PCSK9 inhibitors  Controlled DM-2: A1c 5.1%.  CBG within fair range.  Has not needed SSI.  -Discontinued CBG monitoring and SSI -BMP glucose within normal  Essential hypertension: SBP slightly elevated but improving.  Partly due to IV fluid and pain.. -Hold home losartan in the setting of AKI -Continue home metoprolol and as needed labetalol -Add amlodipine -Pain control  History of ankylosing spondylitis: Not on medications. -Outpatient follow-up  Depression: Stable -Continue home Wellbutrin and SSRI.  Bilateral lower extremity weakness/muscle pain: suspect this to be due to statin.  His alcohol could contribute.  CRP, ESR and aldolase elevated.  -Recommend rechecking outpatient once rhabdo resolves.               DVT prophylaxis: Subcu heparin Code Status: Full  code Family Communication: Patient and/or RN. Available if any question. Disposition Plan: Remains inpatient Consultants: None  Procedures:  None  Microbiology summarized: [id number] negative. MRSA PCR negative.  Sch Meds:  Scheduled Meds: . buPROPion  150 mg Oral QODAY  . chlorhexidine  15 mL Mouth Rinse BID  . docusate sodium  100 mg Oral BID  . fluticasone  1 spray Each Nare Daily  . folic acid  1 mg Oral Daily  . heparin injection (subcutaneous)  5,000 Units Subcutaneous Q8H  . insulin aspart  0-9 Units Subcutaneous TID WC  . mouth rinse  15 mL Mouth Rinse q12n4p  . metoprolol succinate  50 mg Oral Daily  . multivitamin with minerals  1 tablet Oral Daily  . pantoprazole  40 mg Oral Daily  . polyethylene glycol  17 g Oral Daily  . senna  1 tablet Oral BID  . thiamine  100 mg Oral Daily  . vitamin B-12  1,000 mcg Oral Daily   Continuous Infusions: . sodium chloride 150 mL/hr at 01/29/19 0814   PRN Meds:.LORazepam **OR** LORazepam, ondansetron **OR** ondansetron (ZOFRAN) IV, oxyCODONE  Antimicrobials: Anti-infectives (From admission, onward)   None       I have personally reviewed the following labs and images: CBC: Recent Labs  Lab 01/26/19 1702 01/27/19 0523 01/28/19 0550 01/29/19 0546  WBC 6.4 7.0 6.1 7.5  NEUTROABS 5.1  --   --   --  HGB 10.6* 10.1* 10.0* 10.1*  HCT 32.4* 31.6* 31.1* 32.6*  MCV 91.3 94.0 96.0 97.0  PLT 61* 66* 71* 89*   BMP &GFR Recent Labs  Lab 01/26/19 1702 01/26/19 2139 01/27/19 0523 01/27/19 1549 01/28/19 0550 01/29/19 0546  NA 128*  --  131* 130* 131* 135  K 3.6  --  3.4* 3.6 3.6 3.6  CL 99  --  102 104 106 110  CO2 17*  --  19* 19* 16* 17*  GLUCOSE 105*  --  101* 105* 101* 107*  BUN 39*  --  39* 40* 38* 32*  CREATININE 2.92*  --  2.85* 2.85* 2.41* 1.83*  CALCIUM 7.4*  --  7.4* 7.0* 6.9* 7.1*  MG  --  1.5*  --   --  1.3* 1.6*  PHOS  --  3.4  --   --  2.7  --    Estimated Creatinine Clearance: 52.2 mL/min (A) (by  C-G formula based on SCr of 1.83 mg/dL (H)). Liver & Pancreas: Recent Labs  Lab 01/26/19 1702 01/27/19 0523 01/27/19 1549 01/28/19 0550 01/29/19 0546  AST 1,632* 1,643* 1,471* 1,174* 887*  ALT 374* 367* 349* 325* 302*  ALKPHOS 246* 226* 228* 221* 231*  BILITOT 6.3* 5.3* 5.8* 4.6* 4.2*  PROT 6.6 6.2* 6.3* 6.0* 6.0*  ALBUMIN 2.4* 2.2* 2.3* 2.1* 2.2*   No results for input(s): LIPASE, AMYLASE in the last 168 hours. No results for input(s): AMMONIA in the last 168 hours. Diabetic: Recent Labs    01/26/19 1953  HGBA1C 5.1   Recent Labs  Lab 01/28/19 1156 01/28/19 1637 01/28/19 2243 01/29/19 0743 01/29/19 1201  GLUCAP 121* 94 97 101* 122*   Cardiac Enzymes: Recent Labs  Lab 01/26/19 2139 01/27/19 0523 01/27/19 1549 01/28/19 0550 01/29/19 0546  CKTOTAL 32,119* 31,136* 20,073* 12,039* 7,304*   No results for input(s): PROBNP in the last 8760 hours. Coagulation Profile: Recent Labs  Lab 01/26/19 2139 01/27/19 0523 01/28/19 0550  INR 1.4* 1.4* 1.4*   Thyroid Function Tests: No results for input(s): TSH, T4TOTAL, FREET4, T3FREE, THYROIDAB in the last 72 hours. Lipid Profile: No results for input(s): CHOL, HDL, LDLCALC, TRIG, CHOLHDL, LDLDIRECT in the last 72 hours. Anemia Panel: Recent Labs    01/26/19 2139  FERRITIN 494*  TIBC 249*  IRON 167  RETICCTPCT 1.9   Urine analysis:    Component Value Date/Time   COLORURINE YELLOW 01/26/2019 2242   APPEARANCEUR CLEAR 01/26/2019 2242   LABSPEC 1.008 01/26/2019 2242   PHURINE 6.0 01/26/2019 2242   GLUCOSEU NEGATIVE 01/26/2019 2242   HGBUR LARGE (A) 01/26/2019 2242   [place] NEGATIVE 01/26/2019 2242   KETONESUR NEGATIVE 01/26/2019 2242   PROTEINUR 100 (A) 01/26/2019 2242   UROBILINOGEN 1.0 10/28/2013 1857   NITRITE NEGATIVE 01/26/2019 2242   LEUKOCYTESUR NEGATIVE 01/26/2019 2242   Sepsis Labs: Invalid input(s): PROCALCITONIN, [place]  Microbiology: Recent Results (from the past 240 hour(s))   SARS CORONAVIRUS 2 (TAT 6-24 HRS) Nasopharyngeal Nasopharyngeal Swab     Status: None   Collection Time: 01/26/19  7:16 PM   Specimen: Nasopharyngeal Swab  Result Value Ref Range Status   SARS Coronavirus 2 NEGATIVE NEGATIVE Final    Comment: (NOTE) SARS-CoV-2 target nucleic acids are NOT DETECTED. The SARS-CoV-2 RNA is generally detectable in upper and lower respiratory specimens during the acute phase of infection. Negative results do not preclude SARS-CoV-2 infection, do not rule out co-infections with other pathogens, and should not be used as the sole basis for treatment  or other patient management decisions. Negative results must be combined with clinical observations, patient history, and epidemiological information. The expected result is Negative. Fact Sheet for Patients: SugarRoll.be Fact Sheet for Healthcare Providers: https://www.woods-mathews.com/ This test is not yet approved or cleared by the [place] FDA and  has been authorized for detection and/or diagnosis of SARS-CoV-2 by FDA under an Emergency Use Authorization (EUA). This EUA will remain  in effect (meaning this test can be used) for the duration of the COVID-19 declaration under Section 56 4(b)(1) of the Act, 21 U.S.C. section 360bbb-3(b)(1), unless the authorization is terminated or revoked sooner. Performed at [facility], [place] [address], [place], [place] [address]   MRSA PCR Screening     Status: None   Collection Time: 01/27/19 10:00 AM   Specimen: Nasal Mucosa; Nasopharyngeal  Result Value Ref Range Status   MRSA by PCR NEGATIVE NEGATIVE Final    Comment:        The GeneXpert MRSA Assay (FDA approved for NASAL specimens only), is one component of a comprehensive MRSA colonization surveillance program. It is not intended to diagnose MRSA infection nor to guide or monitor treatment for MRSA infections. Performed at [facility], [place] [address], [place], [place] [address]     Radiology Studies: No results found.  Jona Zappone T. [place]  If 7PM-7AM, please contact night-coverage [email or web address] Password [email or web address] 01/29/2019, 1:25 PM

## 2019-01-29 NOTE — Care Management Important Message (Signed)
Important Message  Patient Details IM Letter given to Marney Doctor RN to present to the Patient Name: Gregory Hansen MRN: [id number] Date of Birth: 1950/05/10   Medicare Important Message Given:  Yes     [name] 01/29/2019, 9:52 AM

## 2019-01-30 LAB — COMPREHENSIVE METABOLIC PANEL
ALT: 254 U/L — ABNORMAL HIGH (ref 0–44)
AST: 619 U/L — ABNORMAL HIGH (ref 15–41)
Albumin: 1.8 g/dL — ABNORMAL LOW (ref 3.5–5.0)
Alkaline Phosphatase: 210 U/L — ABNORMAL HIGH (ref 38–126)
Anion gap: 5 (ref 5–15)
BUN: 26 mg/dL — ABNORMAL HIGH (ref 8–23)
CO2: 17 mmol/L — ABNORMAL LOW (ref 22–32)
Calcium: 7.4 mg/dL — ABNORMAL LOW (ref 8.9–10.3)
Chloride: 114 mmol/L — ABNORMAL HIGH (ref 98–111)
Creatinine, Ser: 1.29 mg/dL — ABNORMAL HIGH (ref 0.61–1.24)
GFR calc Af Amer: >60 mL/min (ref >60)
GFR calc non Af Amer: 56 mL/min — ABNORMAL LOW (ref >60)
Glucose, Bld: 111 mg/dL — ABNORMAL HIGH (ref 70–99)
Potassium: 3.7 mmol/L (ref 3.5–5.1)
Sodium: 136 mmol/L (ref 135–145)
Total Bilirubin: 3.1 mg/dL — ABNORMAL HIGH (ref 0.3–1.2)
Total Protein: 5.4 g/dL — ABNORMAL LOW (ref 6.5–8.1)

## 2019-01-30 LAB — DRUG PROFILE, UR, 9 DRUGS (LABCORP)
Amphetamines, Urine: NEGATIVE ng/mL
Barbiturate, Ur: NEGATIVE ng/mL
Benzodiazepine Quant, Ur: NEGATIVE ng/mL
Cannabinoid Quant, Ur: NEGATIVE ng/mL
Cocaine (Metab.): NEGATIVE ng/mL
Methadone Screen, Urine: NEGATIVE ng/mL
Opiate Quant, Ur: POSITIVE ng/mL — AB
Phencyclidine, Ur: NEGATIVE ng/mL
Propoxyphene, Urine: NEGATIVE ng/mL

## 2019-01-30 LAB — CBC
HCT: 29.6 % — ABNORMAL LOW (ref 39.0–52.0)
Hemoglobin: 9.5 g/dL — ABNORMAL LOW (ref 13.0–17.0)
MCH: 30.8 pg (ref 26.0–34.0)
MCHC: 32.1 g/dL (ref 30.0–36.0)
MCV: 96.1 fL (ref 80.0–100.0)
Platelets: 98 10*3/uL — ABNORMAL LOW (ref 150–400)
RBC: 3.08 MIL/uL — ABNORMAL LOW (ref 4.22–5.81)
RDW: 24.5 % — ABNORMAL HIGH (ref 11.5–15.5)
WBC: 7.1 10*3/uL (ref 4.0–10.5)
nRBC: 0 % (ref 0.0–0.2)

## 2019-01-30 LAB — PROTIME-INR
INR: 1.3 — ABNORMAL HIGH (ref 0.8–1.2)
Prothrombin Time: 16.3 seconds — ABNORMAL HIGH (ref 11.4–15.2)

## 2019-01-30 LAB — MAGNESIUM: Magnesium: 1.4 mg/dL — ABNORMAL LOW (ref 1.7–2.4)

## 2019-01-30 LAB — GLUCOSE, CAPILLARY
Glucose-Capillary: 104 mg/dL — ABNORMAL HIGH (ref 70–99)
Glucose-Capillary: 110 mg/dL — ABNORMAL HIGH (ref 70–99)
Glucose-Capillary: 86 mg/dL (ref 70–99)

## 2019-01-30 LAB — CK: Total CK: 4894 U/L — ABNORMAL HIGH (ref 49–397)

## 2019-01-30 MED ORDER — POTASSIUM CHLORIDE CRYS ER 20 MEQ PO TBCR
40.0000 meq | EXTENDED_RELEASE_TABLET | Freq: Once | ORAL | Status: AC
  Administered 2019-01-30: 40 meq via ORAL
  Filled 2019-01-30: qty 2

## 2019-01-30 MED ORDER — LORAZEPAM 2 MG/ML IJ SOLN: 1.0000 mg | INTRAMUSCULAR | Status: AC | PRN

## 2019-01-30 MED ORDER — LORAZEPAM 1 MG PO TABS: 1.0000 mg | ORAL_TABLET | ORAL | Status: AC | PRN

## 2019-01-30 MED ORDER — MAGNESIUM SULFATE 4 GM/100ML IV SOLN
4.0000 g | Freq: Once | INTRAVENOUS | Status: AC
  Administered 2019-01-30: 09:00:00 4 g via INTRAVENOUS
  Filled 2019-01-30: qty 100

## 2019-01-30 NOTE — Progress Notes (Signed)
PROGRESS NOTE  Gregory Hansen JKD:[id number] DOB: January 28, 1951   PCP: [name], DO  Patient is from: Home.  Independently ambulates at baseline.  Lives with his son.  DOA: 01/26/2019 LOS: 4  Brief Narrative / Interim history: 68 year old male with history of CAD/MI s/p CABG x4 in 2015, ankylosing spondylitis, HTN, DM-2, alcohol use disorder, depression, GERD and peripheral neuropathy brought to ED by EMS after found down at home by [employer].  Reportedly felt weak and lightheaded and fell walking up the steps at home and could not get up for about 30 minutes until the [employer] found him down.  Denies hitting his head or loss of consciousness.  Reportedly had lower extremity weakness, muscle pains and fatigue for sometimes.  Nausea but no emesis.  Endorses 3-4 shots of bourbon per day.  No history of DT or alcohol withdrawal seizure.  Denies smoking cigarettes or drug use.  Denies Tylenol.  In ED, afebrile and HDS.  Hgb 10.6.  Platelets 61.  Sodium 128.  CO2 17.  K3.4.  Mg 1.5.  Cr 2.92.  BUN 39.  AST 1632.  ALT 374.  ALP 246.  CK 33,000.  LDH 1200.  High-sensitivity troponin 26.  EKG NSR with nonspecific IVCD but no acute ischemic finding.  CT lumbar spine revealed acute burst fracture of L1 without body height loss of spinal cord involvement.  Neurosurgery consulted and recommended TLSO and outpatient follow-up in 2 weeks.  Started on aggressive IV fluids hydration and electrolyte replacement and admitted for fall, rhabdo, AKI, elevated liver enzymes, hyponatremia and lumbar fracture.  Subjective: No major events overnight of this morning.  No complaints.  Pain fairly controlled.  Denies chest pain, dyspnea, GI or UTI symptoms.  Denies new focal weakness, numbness or tingling.  Objective: Vitals:   01/29/19 0535 01/29/19 1522 01/29/19 2253 01/30/19 0631  BP: (!) 159/82 (!) 154/84 (!) 145/80 137/73  Pulse: 81 75 85 76  Resp: _0 Temp: 98.7 F (37.1 C) 97.9 F  (36.6 C) 98.9 F (37.2 C) 98.2 F (36.8 C)  TempSrc: Oral Oral Oral Oral  SpO2: 100% 98% 99% 98%  Weight:      Height:        Intake/Output Summary (Last 24 hours) at 01/30/2019 1244 Last data filed at 01/30/2019 0800 Gross per 24 hour  Intake 240 ml  Output 1200 ml  Net -960 ml   Filed Weights   01/26/19 2107 01/27/19 0500  Weight: 119.1 kg 118.9 kg    Examination:  GENERAL: No acute distress.  Appears well.  HEENT: MMM.  Vision and hearing grossly intact.  NECK: Supple.  No apparent JVD.  RESP:  No IWOB. Good air movement bilaterally. CVS:  RRR. Heart sounds normal.  ABD/GI/GU: Bowel sounds present. Soft. Non tender.  MSK/EXT:  Moves extremities. No apparent deformity or edema.  SKIN: no apparent skin lesion or wound NEURO: Awake, alert and oriented appropriately.  No apparent focal neuro deficit.  Motor 5/5 in both LEs. PSYCH: Calm. Normal affect.  Assessment & Plan: Fall at home/L1 fracture/Traumatic rhabdomyolysis: felt weak, lightheaded and fell.  No head impaction or LOC. -Alcohol could have contributed to his fall in addition to his underlying lower extremity weakness and muscle pain -CK 33,000 (admit)>>> 5000 -L1 burst fracture w/o body height loss or spinal cord involvement-TLSO and outpatient follow-up in 2 wks per NS  -Continue aggressive IV fluid hydration-no cardiopulmonary symptoms. -History suspicious for statin intolerance-he is on very high-dose-could  benefit from PCSK9 inhibitors -As needed pain meds with good bowel regimen for pain control  AKI with azotemia: Cr 1.0 (baseline)> 2.92 (admit)> 2.85>1.29 -suspect ATN due to rhabdo.  Good UOP. -Continue aggressive hydration -Continue monitoring  Non-anion gap metabolic acidosis: Likely due to AKI and IV fluid.  Stable. -Continue monitoring.  Elevated liver enzymes/elevated ALP/hyperbilirubinemia/hepatic steatosis: Likely due to rhabdo, EtOH and statin RUQ [place] with hepatic steatosis.   Improving. -Counseled about alcohol-confident about cessation -Treat rhabdo with IV fluid as above -Would avoid statin in the future -Continue monitoring  Alcohol use disorder: Reports drinking at least 3-4 bourbons a day.  -Encourage cessation-determined to quit -Continue CIWA protocol with Ativan -Multivitamins, thiamine and folic acid  Hyponatremia: beer potomania, AKI and possibly SSRI.  Resolved -Continue IV NS -Continue monitoring  Hypokalemia/hypomagnesemia -Replenish and recheck  Elevated PT/INR/coagulopathy: Likely due to acute hepatitis.  Improved -Continue monitoring  Anemia of chronic disease: Hgb 11-12 (baseline)> 10.6 admit> 10.1>9.5. No melena or hematochezia.  Stable. -Continue monitoring  Elevated troponin/BNP/history of CAD/CABG x4 in 2015:lightheadedness and weakness likely from alcohol.  No cardiopulmonary symptoms now.  Mild troponin and BNP elevation likely delayed clearance.  EKG NSR with nonspecific IVCD but no ischemic finding -Continue monitoring -Continue home cardiac meds except statin-suspect intolerance -Outpatient cardiology follow-up for PCSK9 inhibitors  Controlled DM-2: A1c 5.1%.  CBG within fair range.  Has not needed SSI.  -Discontinued CBG monitoring and SSI -BMP glucose within normal  Essential hypertension: SBP slightly elevated but improving.  Partly due to IV fluid and pain.. -Hold home losartan in the setting of AKI -Continue home metoprolol and as needed labetalol -Add amlodipine -Pain control  History of ankylosing spondylitis: Not on medications. -Outpatient follow-up  Depression: Stable -Continue home Wellbutrin and SSRI.  Bilateral lower extremity weakness/muscle pain: suspect this to be due to statin.  His alcohol could contribute.  CRP, ESR and aldolase elevated.  -Recommend rechecking outpatient once rhabdo resolves.               DVT prophylaxis: Subcu heparin Code Status: Full code Family Communication:  Patient and/or RN. Available if any question. Disposition Plan: Remains inpatient Consultants: None  Procedures:  None  Microbiology summarized: [id number] negative. MRSA PCR negative.  Sch Meds:  Scheduled Meds: . amLODipine  5 mg Oral Daily  . buPROPion  150 mg Oral QODAY  . chlorhexidine  15 mL Mouth Rinse BID  . docusate sodium  100 mg Oral BID  . fluticasone  1 spray Each Nare Daily  . folic acid  1 mg Oral Daily  . heparin injection (subcutaneous)  5,000 Units Subcutaneous Q8H  . mouth rinse  15 mL Mouth Rinse q12n4p  . metoprolol succinate  50 mg Oral Daily  . multivitamin with minerals  1 tablet Oral Daily  . pantoprazole  40 mg Oral Daily  . polyethylene glycol  17 g Oral Daily  . senna  1 tablet Oral BID  . thiamine  100 mg Oral Daily  . vitamin B-12  1,000 mcg Oral Daily   Continuous Infusions: . sodium chloride 150 mL/hr at 01/30/19 0541   PRN Meds:.LORazepam **OR** LORazepam, ondansetron **OR** ondansetron (ZOFRAN) IV, oxyCODONE  Antimicrobials: Anti-infectives (From admission, onward)   None       I have personally reviewed the following labs and images: CBC: Recent Labs  Lab 01/26/19 1702 01/27/19 0523 01/28/19 0550 01/29/19 0546 01/30/19 0602  WBC 6.4 7.0 6.1 7.5 7.1  NEUTROABS 5.1  --   --   --   --  HGB 10.6* 10.1* 10.0* 10.1* 9.5*  HCT 32.4* 31.6* 31.1* 32.6* 29.6*  MCV 91.3 94.0 96.0 97.0 96.1  PLT 61* 66* 71* 89* 98*   BMP &GFR Recent Labs  Lab 01/26/19 2139 01/27/19 0523 01/27/19 1549 01/28/19 0550 01/29/19 0546 01/30/19 0602  NA  --  131* 130* 131* 135 136  K  --  3.4* 3.6 3.6 3.6 3.7  CL  --  102 104 106 110 114*  CO2  --  19* 19* 16* 17* 17*  GLUCOSE  --  101* 105* 101* 107* 111*  BUN  --  39* 40* 38* 32* 26*  CREATININE  --  2.85* 2.85* 2.41* 1.83* 1.29*  CALCIUM  --  7.4* 7.0* 6.9* 7.1* 7.4*  MG 1.5*  --   --  1.3* 1.6* 1.4*  PHOS 3.4  --   --  2.7  --   --    Estimated Creatinine Clearance: 74.1 mL/min (A) (by C-G  formula based on SCr of 1.29 mg/dL (H)). Liver & Pancreas: Recent Labs  Lab 01/27/19 0523 01/27/19 1549 01/28/19 0550 01/29/19 0546 01/30/19 0602  AST 1,643* 1,471* 1,174* 887* 619*  ALT 367* 349* 325* 302* 254*  ALKPHOS 226* 228* 221* 231* 210*  BILITOT 5.3* 5.8* 4.6* 4.2* 3.1*  PROT 6.2* 6.3* 6.0* 6.0* 5.4*  ALBUMIN 2.2* 2.3* 2.1* 2.2* 1.8*   No results for input(s): LIPASE, AMYLASE in the last 168 hours. No results for input(s): AMMONIA in the last 168 hours. Diabetic: No results for input(s): HGBA1C in the last 72 hours. Recent Labs  Lab 01/29/19 1201 01/29/19 1635 01/29/19 2255 01/30/19 0736 01/30/19 1201  GLUCAP 122* 90 98 104* 110*   Cardiac Enzymes: Recent Labs  Lab 01/27/19 0523 01/27/19 1549 01/28/19 0550 01/29/19 0546 01/30/19 0602  CKTOTAL 31,136* 20,073* 12,039* 7,304* 4,894*   No results for input(s): PROBNP in the last 8760 hours. Coagulation Profile: Recent Labs  Lab 01/26/19 2139 01/27/19 0523 01/28/19 0550 01/30/19 0602  INR 1.4* 1.4* 1.4* 1.3*   Thyroid Function Tests: No results for input(s): TSH, T4TOTAL, FREET4, T3FREE, THYROIDAB in the last 72 hours. Lipid Profile: No results for input(s): CHOL, HDL, LDLCALC, TRIG, CHOLHDL, LDLDIRECT in the last 72 hours. Anemia Panel: No results for input(s): VITAMINB12, FOLATE, FERRITIN, TIBC, IRON, RETICCTPCT in the last 72 hours. Urine analysis:    Component Value Date/Time   COLORURINE YELLOW 01/26/2019 2242   APPEARANCEUR CLEAR 01/26/2019 2242   LABSPEC 1.008 01/26/2019 2242   PHURINE 6.0 01/26/2019 2242   GLUCOSEU NEGATIVE 01/26/2019 2242   HGBUR LARGE (A) 01/26/2019 2242   [place] NEGATIVE 01/26/2019 [place] 01/26/2019 2242   PROTEINUR 100 (A) 01/26/2019 2242   UROBILINOGEN 1.0 10/28/2013 1857   NITRITE NEGATIVE 01/26/2019 2242   LEUKOCYTESUR NEGATIVE 01/26/2019 2242   Sepsis Labs: Invalid input(s): PROCALCITONIN, [place]  Microbiology: Recent Results  (from the past 240 hour(s))  SARS CORONAVIRUS 2 (TAT 6-24 HRS) Nasopharyngeal Nasopharyngeal Swab     Status: None   Collection Time: 01/26/19  7:16 PM   Specimen: Nasopharyngeal Swab  Result Value Ref Range Status   SARS Coronavirus 2 NEGATIVE NEGATIVE Final    Comment: (NOTE) SARS-CoV-2 target nucleic acids are NOT DETECTED. The SARS-CoV-2 RNA is generally detectable in upper and lower respiratory specimens during the acute phase of infection. Negative results do not preclude SARS-CoV-2 infection, do not rule out co-infections with other pathogens, and should not be used as the sole basis for treatment or other patient  management decisions. Negative results must be combined with clinical observations, patient history, and epidemiological information. The expected result is Negative. Fact Sheet for Patients: SugarRoll.be Fact Sheet for Healthcare Providers: https://www.woods-mathews.com/ This test is not yet approved or cleared by the [place] FDA and  has been authorized for detection and/or diagnosis of SARS-CoV-2 by FDA under an Emergency Use Authorization (EUA). This EUA will remain  in effect (meaning this test can be used) for the duration of the COVID-19 declaration under Section 56 4(b)(1) of the Act, 21 U.S.C. section 360bbb-3(b)(1), unless the authorization is terminated or revoked sooner. Performed at [facility], [place] [address], [place], [place] [address]   MRSA PCR Screening     Status: None   Collection Time: 01/27/19 10:00 AM   Specimen: Nasal Mucosa; Nasopharyngeal  Result Value Ref Range Status   MRSA by PCR NEGATIVE NEGATIVE Final    Comment:        The GeneXpert MRSA Assay (FDA approved for NASAL specimens only), is one component of a comprehensive MRSA colonization surveillance program. It is not intended to diagnose MRSA infection nor to guide or monitor treatment for MRSA infections. Performed at  [facility], [place] [address], [place], [place] [address]     Radiology Studies: No results found.  Michille Mcelrath T. [place]  If 7PM-7AM, please contact night-coverage [email or web address] Password [facility] 01/30/2019, 12:44 PM

## 2019-01-31 ENCOUNTER — Inpatient Hospital Stay (HOSPITAL_COMMUNITY): Payer: Medicare Other

## 2019-01-31 LAB — COMPREHENSIVE METABOLIC PANEL
ALT: 226 U/L — ABNORMAL HIGH (ref 0–44)
AST: 446 U/L — ABNORMAL HIGH (ref 15–41)
Albumin: 2 g/dL — ABNORMAL LOW (ref 3.5–5.0)
Alkaline Phosphatase: 201 U/L — ABNORMAL HIGH (ref 38–126)
Anion gap: 6 (ref 5–15)
BUN: 25 mg/dL — ABNORMAL HIGH (ref 8–23)
CO2: 17 mmol/L — ABNORMAL LOW (ref 22–32)
Calcium: 7.6 mg/dL — ABNORMAL LOW (ref 8.9–10.3)
Chloride: 113 mmol/L — ABNORMAL HIGH (ref 98–111)
Creatinine, Ser: 1.09 mg/dL (ref 0.61–1.24)
GFR calc Af Amer: >60 mL/min (ref >60)
GFR calc non Af Amer: >60 mL/min (ref >60)
Glucose, Bld: 118 mg/dL — ABNORMAL HIGH (ref 70–99)
Potassium: 3.9 mmol/L (ref 3.5–5.1)
Sodium: 136 mmol/L (ref 135–145)
Total Bilirubin: 3 mg/dL — ABNORMAL HIGH (ref 0.3–1.2)
Total Protein: 5.7 g/dL — ABNORMAL LOW (ref 6.5–8.1)

## 2019-01-31 LAB — RNA QUALITATIVE: HIV 1 RNA Qualitative: 1

## 2019-01-31 LAB — CK: Total CK: 3120 U/L — ABNORMAL HIGH (ref 49–397)

## 2019-01-31 LAB — PROTIME-INR
INR: 1.2 (ref 0.8–1.2)
Prothrombin Time: 15.2 seconds (ref 11.4–15.2)

## 2019-01-31 LAB — PHOSPHORUS: Phosphorus: 2.3 mg/dL — ABNORMAL LOW (ref 2.5–4.6)

## 2019-01-31 LAB — GLUCOSE, CAPILLARY
Glucose-Capillary: 93 mg/dL (ref 70–99)
Glucose-Capillary: 90 mg/dL (ref 70–99)

## 2019-01-31 LAB — HIV 1/2 AB DIFFERENTIATION
HIV 1 Ab: NEGATIVE
HIV 2 Ab: NEGATIVE
Note: NEGATIVE

## 2019-01-31 LAB — HIV ANTIBODY (ROUTINE TESTING W REFLEX): HIV Screen 4th Generation wRfx: REACTIVE — AB

## 2019-01-31 LAB — MAGNESIUM: Magnesium: 1.5 mg/dL — ABNORMAL LOW (ref 1.7–2.4)

## 2019-01-31 NOTE — Progress Notes (Signed)
PROGRESS NOTE  Gregory Hansen MHD:[id number] DOB: 1950-04-27   PCP: [name], DO  Patient is from: Home.  Independently ambulates at baseline.  Lives with his son.  DOA: 01/26/2019 LOS: 5  Brief Narrative / Interim history: 68 year old male with history of CAD/MI s/p CABG x4 in 2015, ankylosing spondylitis, HTN, DM-2, alcohol use disorder, depression, GERD and peripheral neuropathy brought to ED by EMS after found down at home by [employer].  Reportedly felt weak and lightheaded and fell walking up the steps at home and could not get up for about 30 minutes until the [employer] found him down.  Denies hitting his head or loss of consciousness.  Reportedly had lower extremity weakness, muscle pains and fatigue for sometimes.  Nausea but no emesis.  Endorses 3-4 shots of bourbon per day.  No history of DT or alcohol withdrawal seizure.  Denies smoking cigarettes or drug use.  Denies Tylenol.  In ED, afebrile and HDS.  Hgb 10.6.  Platelets 61.  Sodium 128.  CO2 17.  K3.4.  Mg 1.5.  Cr 2.92.  BUN 39.  AST 1632.  ALT 374.  ALP 246.  CK 33,000.  LDH 1200.  High-sensitivity troponin 26.  EKG NSR with nonspecific IVCD but no acute ischemic finding.  CT lumbar spine revealed acute burst fracture of L1 without body height loss of spinal cord involvement.  Neurosurgery consulted and recommended TLSO and outpatient follow-up in 2 weeks.  Started on aggressive IV fluids hydration and electrolyte replacement and admitted for fall, rhabdo, AKI, elevated liver enzymes, hyponatremia and lumbar fracture.  Subjective: Patient seen and examined.  Feels better.  No complaint.  Objective: Vitals:   01/29/19 2253 01/30/19 0631 01/30/19 1511 01/30/19 2214  BP: (!) 145/80 137/73 (!) 153/76 (!) 153/81  Pulse: 85 76 78 75  Resp: _0 Temp: 98.9 F (37.2 C) 98.2 F (36.8 C) 98.7 F (37.1 C) 99 F (37.2 C)  TempSrc: Oral Oral Oral Oral  SpO2: 99% 98% 99% 95%  Weight:      Height:         Intake/Output Summary (Last 24 hours) at 01/31/2019 1212 Last data filed at 01/31/2019 1124 Gross per 24 hour  Intake 7171.16 ml  Output 2500 ml  Net 4671.16 ml   Filed Weights   01/26/19 2107 01/27/19 0500  Weight: 119.1 kg 118.9 kg    Examination:  General exam: Appears calm and comfortable  Respiratory system: Clear to auscultation. Respiratory effort normal. Cardiovascular system: S1 & S2 heard, RRR. No JVD, murmurs, rubs, gallops or clicks. No pedal edema. Gastrointestinal system: Abdomen is nondistended, soft and nontender. No organomegaly or masses felt. Normal bowel sounds heard. Central nervous system: Alert and oriented. No focal neurological deficits. Extremities: Symmetric 5 x 5 power. Skin: No rashes, lesions or ulcers.  Psychiatry: Judgement and insight appear normal. Mood & affect appropriate.   Assessment & Plan: Fall at home/L1 fracture/Traumatic rhabdomyolysis: felt weak, lightheaded and fell.  No head impaction or LOC. -Alcohol could have contributed to his fall in addition to his underlying lower extremity weakness and muscle pain -CK 33,000 (admit)>>> 5000> 3120. -L1 burst fracture w/o body height loss or spinal cord involvement-TLSO and outpatient follow-up in 2 wks per NS  -Continue IV fluids will reduce frequency 200 cc/h -History suspicious for statin intolerance-he is on very high-dose-could benefit from PCSK9 inhibitors -As needed pain meds with good bowel regimen for pain control  AKI with azotemia: Cr 1.0 (baseline)>  2.92 (admit)> 2.85>1.29> 1.09-suspect ATN due to rhabdo.  Good UOP. We will reduce IV fluids 200 cc/h.  Non-anion gap metabolic acidosis: Likely due to AKI and IV fluid.  Stable. -Continue monitoring.  Elevated liver enzymes/elevated ALP/hyperbilirubinemia/hepatic steatosis/alcoholic hepatitis/shock liver: Likely either due to rhabdo vs EtOH and statin or shock liver due to being on the floor.  RUQ [place] with hepatic steatosis.   Improving. -Counseled about alcohol-confident about cessation -Treat rhabdo with IV fluid as above -Would avoid statin in the future -Continue monitoring.    Alcohol use disorder: Reports drinking at least 3-4 bourbons a day.  -Encourage cessation-determined to quit -No signs of withdrawal.  Continue CIWA protocol with Ativan -Multivitamins, thiamine and folic acid  Hyponatremia: beer potomania, AKI and possibly SSRI.  Resolved -Continue IV NS -Continue monitoring  Hypokalemia/hypomagnesemia Hypokalemia resolved.  Mild hypomagnesemia.  Will replenish.  Elevated PT/INR/coagulopathy: Likely due to acute hepatitis.  Improved -Continue monitoring  Anemia of chronic disease: Hgb 11-12 (baseline)> 10.6 admit> 10.1>9.5. No melena or hematochezia.  Stable. -Continue monitoring  Elevated troponin/BNP/history of CAD/CABG x4 in 2015:lightheadedness and weakness likely from alcohol.  No cardiopulmonary symptoms now.  Mild troponin and BNP elevation likely delayed clearance.  EKG NSR with nonspecific IVCD but no ischemic finding -Continue monitoring -Continue home cardiac meds except statin-suspect intolerance -Outpatient cardiology follow-up for PCSK9 inhibitors  Controlled DM-2: A1c 5.1%.  CBG within fair range.  Has not needed SSI.  -Discontinued CBG monitoring and SSI -BMP glucose within normal  Essential hypertension: Blood pressure controlled.  Holding losartan due to AKI.  Continue home dose of metoprolol and amlodipine 5 mg that was started here.  History of ankylosing spondylitis: Not on medications. -Outpatient follow-up  Depression: Stable -Continue home Wellbutrin and SSRI.  Bilateral lower extremity weakness/muscle pain: suspect this to be due to statin.  His alcohol could contribute.  CRP, ESR and aldolase elevated.  -Recommend rechecking outpatient once rhabdo resolves.               DVT prophylaxis: Subcu heparin Code Status: Full code Family Communication: No  family member present.  Plan of care discussed with patient who verbalized understanding. Disposition Plan: Remains inpatient.  Will consult PT OT to assess. Consultants: None  Procedures:  None  Microbiology summarized: [id number] negative. MRSA PCR negative.  Sch Meds:  Scheduled Meds: . amLODipine  5 mg Oral Daily  . buPROPion  150 mg Oral QODAY  . chlorhexidine  15 mL Mouth Rinse BID  . docusate sodium  100 mg Oral BID  . fluticasone  1 spray Each Nare Daily  . folic acid  1 mg Oral Daily  . heparin injection (subcutaneous)  5,000 Units Subcutaneous Q8H  . mouth rinse  15 mL Mouth Rinse q12n4p  . metoprolol succinate  50 mg Oral Daily  . multivitamin with minerals  1 tablet Oral Daily  . pantoprazole  40 mg Oral Daily  . polyethylene glycol  17 g Oral Daily  . senna  1 tablet Oral BID  . thiamine  100 mg Oral Daily  . vitamin B-12  1,000 mcg Oral Daily   Continuous Infusions: . sodium chloride 150 mL/hr at 01/31/19 1124   PRN Meds:.LORazepam **OR** LORazepam, ondansetron **OR** ondansetron (ZOFRAN) IV, oxyCODONE  Antimicrobials: Anti-infectives (From admission, onward)   None       I have personally reviewed the following labs and images: CBC: Recent Labs  Lab 01/26/19 1702 01/27/19 0523 01/28/19 0550 01/29/19 0546 01/30/19 0602  WBC 6.4 7.0  6.1 7.5 7.1  NEUTROABS 5.1  --   --   --   --   HGB 10.6* 10.1* 10.0* 10.1* 9.5*  HCT 32.4* 31.6* 31.1* 32.6* 29.6*  MCV 91.3 94.0 96.0 97.0 96.1  PLT 61* 66* 71* 89* 98*   BMP &GFR Recent Labs  Lab 01/26/19 2139  01/27/19 1549 01/28/19 0550 01/29/19 0546 01/30/19 0602 01/31/19 0601  NA  --    < > 130* 131* 135 136 136  K  --    < > 3.6 3.6 3.6 3.7 3.9  CL  --    < > 104 106 110 114* 113*  CO2  --    < > 19* 16* 17* 17* 17*  GLUCOSE  --    < > 105* 101* 107* 111* 118*  BUN  --    < > 40* 38* 32* 26* 25*  CREATININE  --    < > 2.85* 2.41* 1.83* 1.29* 1.09  CALCIUM  --    < > 7.0* 6.9* 7.1* 7.4* 7.6*  MG  1.5*  --   --  1.3* 1.6* 1.4* 1.5*  PHOS 3.4  --   --  2.7  --   --  2.3*   < > = values in this interval not displayed.   Estimated Creatinine Clearance: 87.7 mL/min (by C-G formula based on SCr of 1.09 mg/dL). Liver & Pancreas: Recent Labs  Lab 01/27/19 1549 01/28/19 0550 01/29/19 0546 01/30/19 0602 01/31/19 0601  AST 1,471* 1,174* 887* 619* 446*  ALT 349* 325* 302* 254* 226*  ALKPHOS 228* 221* 231* 210* 201*  BILITOT 5.8* 4.6* 4.2* 3.1* 3.0*  PROT 6.3* 6.0* 6.0* 5.4* 5.7*  ALBUMIN 2.3* 2.1* 2.2* 1.8* 2.0*   No results for input(s): LIPASE, AMYLASE in the last 168 hours. No results for input(s): AMMONIA in the last 168 hours. Diabetic: No results for input(s): HGBA1C in the last 72 hours. Recent Labs  Lab 01/29/19 2255 01/30/19 0736 01/30/19 1201 01/30/19 1700 01/31/19 0731  GLUCAP 98 104* 110* 86 93   Cardiac Enzymes: Recent Labs  Lab 01/27/19 1549 01/28/19 0550 01/29/19 0546 01/30/19 0602 01/31/19 0601  CKTOTAL 20,073* 12,039* 7,304* 4,894* 3,120*   No results for input(s): PROBNP in the last 8760 hours. Coagulation Profile: Recent Labs  Lab 01/26/19 2139 01/27/19 0523 01/28/19 0550 01/30/19 0602 01/31/19 0601  INR 1.4* 1.4* 1.4* 1.3* 1.2   Thyroid Function Tests: No results for input(s): TSH, T4TOTAL, FREET4, T3FREE, THYROIDAB in the last 72 hours. Lipid Profile: No results for input(s): CHOL, HDL, LDLCALC, TRIG, CHOLHDL, LDLDIRECT in the last 72 hours. Anemia Panel: No results for input(s): VITAMINB12, FOLATE, FERRITIN, TIBC, IRON, RETICCTPCT in the last 72 hours. Urine analysis:    Component Value Date/Time   COLORURINE YELLOW 01/26/2019 2242   APPEARANCEUR CLEAR 01/26/2019 2242   LABSPEC 1.008 01/26/2019 2242   PHURINE 6.0 01/26/2019 2242   GLUCOSEU NEGATIVE 01/26/2019 2242   HGBUR LARGE (A) 01/26/2019 2242   [place] NEGATIVE 01/26/2019 [place] 01/26/2019 2242   PROTEINUR 100 (A) 01/26/2019 2242   UROBILINOGEN 1.0  10/28/2013 1857   NITRITE NEGATIVE 01/26/2019 2242   LEUKOCYTESUR NEGATIVE 01/26/2019 2242   Sepsis Labs: Invalid input(s): PROCALCITONIN, [place]  Microbiology: Recent Results (from the past 240 hour(s))  SARS CORONAVIRUS 2 (TAT 6-24 HRS) Nasopharyngeal Nasopharyngeal Swab     Status: None   Collection Time: 01/26/19  7:16 PM   Specimen: Nasopharyngeal Swab  Result Value Ref Range Status  SARS Coronavirus 2 NEGATIVE NEGATIVE Final    Comment: (NOTE) SARS-CoV-2 target nucleic acids are NOT DETECTED. The SARS-CoV-2 RNA is generally detectable in upper and lower respiratory specimens during the acute phase of infection. Negative results do not preclude SARS-CoV-2 infection, do not rule out co-infections with other pathogens, and should not be used as the sole basis for treatment or other patient management decisions. Negative results must be combined with clinical observations, patient history, and epidemiological information. The expected result is Negative. Fact Sheet for Patients: SugarRoll.be Fact Sheet for Healthcare Providers: https://www.woods-mathews.com/ This test is not yet approved or cleared by the [place] FDA and  has been authorized for detection and/or diagnosis of SARS-CoV-2 by FDA under an Emergency Use Authorization (EUA). This EUA will remain  in effect (meaning this test can be used) for the duration of the COVID-19 declaration under Section 56 4(b)(1) of the Act, 21 U.S.C. section 360bbb-3(b)(1), unless the authorization is terminated or revoked sooner. Performed at [facility], [place] [address], [place], [place] [address]   MRSA PCR Screening     Status: None   Collection Time: 01/27/19 10:00 AM   Specimen: Nasal Mucosa; Nasopharyngeal  Result Value Ref Range Status   MRSA by PCR NEGATIVE NEGATIVE Final    Comment:        The GeneXpert MRSA Assay (FDA approved for NASAL specimens only), is one  component of a comprehensive MRSA colonization surveillance program. It is not intended to diagnose MRSA infection nor to guide or monitor treatment for MRSA infections. Performed at [facility], [place] [address], [place], [place] [address]     Radiology Studies: No results found.  Darliss Cheney, MD [facility]  If 7PM-7AM, please contact night-coverage [email or web address] Password [email or web address] 01/31/2019, 12:12 PM

## 2019-02-01 LAB — COMPREHENSIVE METABOLIC PANEL
ALT: 189 U/L — ABNORMAL HIGH (ref 0–44)
AST: 297 U/L — ABNORMAL HIGH (ref 15–41)
Albumin: 1.8 g/dL — ABNORMAL LOW (ref 3.5–5.0)
Alkaline Phosphatase: 181 U/L — ABNORMAL HIGH (ref 38–126)
Anion gap: 5 (ref 5–15)
BUN: 20 mg/dL (ref 8–23)
CO2: 18 mmol/L — ABNORMAL LOW (ref 22–32)
Calcium: 7.6 mg/dL — ABNORMAL LOW (ref 8.9–10.3)
Chloride: 115 mmol/L — ABNORMAL HIGH (ref 98–111)
Creatinine, Ser: 0.87 mg/dL (ref 0.61–1.24)
GFR calc Af Amer: >60 mL/min (ref >60)
GFR calc non Af Amer: >60 mL/min (ref >60)
Glucose, Bld: 123 mg/dL — ABNORMAL HIGH (ref 70–99)
Potassium: 3.7 mmol/L (ref 3.5–5.1)
Sodium: 138 mmol/L (ref 135–145)
Total Bilirubin: 2.4 mg/dL — ABNORMAL HIGH (ref 0.3–1.2)
Total Protein: 5.5 g/dL — ABNORMAL LOW (ref 6.5–8.1)

## 2019-02-01 LAB — CBC WITH DIFFERENTIAL/PLATELET
Abs Immature Granulocytes: 0.06 10*3/uL (ref 0.00–0.07)
Basophils Absolute: 0.1 10*3/uL (ref 0.0–0.1)
Basophils Relative: 1 %
Eosinophils Absolute: 0.4 10*3/uL (ref 0.0–0.5)
Eosinophils Relative: 7 %
HCT: 31.7 % — ABNORMAL LOW (ref 39.0–52.0)
Hemoglobin: 9.8 g/dL — ABNORMAL LOW (ref 13.0–17.0)
Immature Granulocytes: 1 %
Lymphocytes Relative: 17 %
Lymphs Abs: 1.1 10*3/uL (ref 0.7–4.0)
MCH: 30.7 pg (ref 26.0–34.0)
MCHC: 30.9 g/dL (ref 30.0–36.0)
MCV: 99.4 fL (ref 80.0–100.0)
Monocytes Absolute: 1.2 10*3/uL — ABNORMAL HIGH (ref 0.1–1.0)
Monocytes Relative: 18 %
Neutro Abs: 3.8 10*3/uL (ref 1.7–7.7)
Neutrophils Relative %: 56 %
Platelets: 124 10*3/uL — ABNORMAL LOW (ref 150–400)
RBC: 3.19 MIL/uL — ABNORMAL LOW (ref 4.22–5.81)
RDW: 25.2 % — ABNORMAL HIGH (ref 11.5–15.5)
WBC: 6.6 10*3/uL (ref 4.0–10.5)
nRBC: 0 % (ref 0.0–0.2)

## 2019-02-01 LAB — MAGNESIUM: Magnesium: 1.1 mg/dL — ABNORMAL LOW (ref 1.7–2.4)

## 2019-02-01 MED ORDER — LOSARTAN POTASSIUM 50 MG PO TABS
50.0000 mg | ORAL_TABLET | Freq: Every day | ORAL | Status: DC
  Administered 2019-02-01 – 2019-02-08 (×8): 50 mg via ORAL
  Filled 2019-02-01 (×8): qty 1

## 2019-02-01 MED ORDER — MAGNESIUM SULFATE 2 GM/50ML IV SOLN
2.0000 g | Freq: Four times a day (QID) | INTRAVENOUS | Status: AC
  Administered 2019-02-01 (×2): 2 g via INTRAVENOUS
  Filled 2019-02-01 (×2): qty 50

## 2019-02-01 NOTE — Evaluation (Addendum)
Physical Therapy Evaluation Patient Details Name: Gregory Hansen MRN: [id number] DOB: 1950-03-13 Today's Date: 02/01/2019   History of Present Illness  Gregory Hansen is a 68 y.o. male with medical history significant for CAD/MI status post CABG, ankylosing spondylitis, hypertension, diabetes mellitus type 2 and alcohol abuse who presented to the ED from home after a mechanical fall in his garage.  Patient reports that he was walking up the steps into his home when he felt weak and lightheaded and fell backward onto his back. He states that he was down on the ground for approximately 1 hour unable to get up and he was assisted into his home by an [place] delivery man.  He endorses pain in his back and weakness in his lower extremities.  He reports that his weakness has been ongoing now for some time and is associated with fatigue.  He has also noticed increased abdominal swelling. Pt found to have acute burst fracture of T1 and is being treated conservatively with TLSO brace.    Clinical Impression  Gregory Hansen is 68 y.o. male admitted with above HPI and diagnosis. Patient is currently limited by functional impairments below (see PT problem list). Patient lives with his son who works during the day and reports he has been independent at baseline and denies using a SPC. Patient will benefit from continued skilled PT interventions to address impairments and progress independence with mobility. Recommending CIR to address functional mobiltiy deficits and progress towards PLOF. Acute PT will follow and progress as able.  Pt was able to perform sit<>stand from varying surfaces heights and required min-mod assist with 2 person for safety from elevated surface and max assist with 2 person for physical and safety from low surface of standard chair. He currently requires assist to don socks and TLSO brace in sitting and was previously independent for ADL's and mobility.    Follow Up Recommendations CIR     Equipment Recommendations  Other (comment)(defer to facility or (bariatric for RW and 3-in-1))    Recommendations for Other Services OT consult     Precautions / Restrictions Precautions Precautions: Fall Required Braces or Orthoses: Spinal Brace Spinal Brace: Thoracolumbosacral orthotic;Applied in sitting position;Other (comment) Spinal Brace Comments: TLSO for OOB/ambulation      Mobility  Bed Mobility Overal bed mobility: Needs Assistance Bed Mobility: Supine to Sit;Sit to Supine     Supine to sit: HOB elevated;Min assist Sit to supine: Mod assist;HOB elevated   General bed mobility comments: assist for LE mobility and verbal cues for sequencing use of bed rails to raise trunk upright. assist requried for raising bil LE's into bed and to reposition trunk.  Transfers Overall transfer level: Needs assistance Equipment used: Rolling walker (2 wheeled) Transfers: Sit to/from [facility] Sit to Stand: From elevated surface;+2 safety/equipment;+2 physical assistance Stand pivot transfers: +2 safety/equipment;From elevated surface;+2 physical assistance       General transfer comment: Pt performed 1x sit to stand from elevated EOB with RW and 2 person assist to step to [facility]. Commode was narrow for pt's hips and he requried 3+ assist to perform sit to stand from [facility] to with RW and a quick pivot to standard chair. From standard chair pt used Denna Haggard to perform sit to stand with 3+ assist (1 person stabilized chair), pt with uch improved abiltiy to initiate power up with use of UE's on Stedy crossbar. He then performed additional sit<>stand in [place] to return to bed.  Ambulation/Gait  General Gait Details: NT due to safety concern and LE weakness  Stairs            Wheelchair Mobility    Modified Rankin (Stroke Patients Only)       Balance Overall balance assessment: Needs assistance Sitting-balance support: Feet supported;No upper  extremity supported Sitting balance-Leahy Scale: Good     Standing balance support: During functional activity;Bilateral upper extremity supported Standing balance-Leahy Scale: Poor              Pertinent Vitals/Pain Pain Assessment: Faces Faces Pain Scale: Hurts even more Pain Location: low back Pain Descriptors / Indicators: Aching;Discomfort;Grimacing;Guarding Pain Intervention(s): Limited activity within patient's tolerance;Monitored during session;Repositioned    Home Living Family/patient expects to be discharged to:: Private residence Living Arrangements: Children(pt's son) Available Help at Discharge: Family;Available PRN/intermittently(pt's on works out of home from 9am-6pm) Type of Home: House Home Access: Stairs to enter Entrance Stairs-Rails: None Technical brewer of Steps: 2 at front 3 from garage [place]: One level [place]: [place] - single point      Prior Function Level of Independence: Independent         Comments: pt reports he was occasionally using SPC to mobilize     Hand Dominance   Dominant Hand: Right    Extremity/Trunk Assessment   Upper Extremity Assessment Upper Extremity Assessment: Generalized weakness    Lower Extremity Assessment Lower Extremity Assessment: Generalized weakness    Cervical / Trunk Assessment Cervical / Trunk Assessment: Normal  Communication   Communication: No difficulties  Cognition Arousal/Alertness: Awake/alert Behavior During Therapy: WFL for tasks assessed/performed;Anxious Overall Cognitive Status: Within Functional Limits for tasks assessed          General Comments: pt's with bruising and scabs along knees and forearms. pt reports being claustrophobic and became anxious with TLSO brace initially and with [facility]      General Comments      Exercises     Assessment/Plan    PT Assessment Patient needs continued PT services  PT Problem List Decreased strength;Decreased  mobility;Decreased activity tolerance;Decreased knowledge of use of DME;Decreased balance;Decreased knowledge of precautions       PT Treatment Interventions DME instruction;Functional mobility training;Patient/family education;Balance training;Therapeutic activities;Gait training;Stair training;Therapeutic exercise;Neuromuscular re-education    PT Goals (Current goals can be found in the Care Plan section)  Acute Rehab PT Goals Patient Stated Goal: to be able to get up and walk again PT Goal Formulation: With patient Time For Goal Achievement: 02/15/19 Potential to Achieve Goals: Fair    Frequency  3x/week   Barriers to discharge Decreased caregiver support;Inaccessible home environment pt has stairs with no rails to enter home, pt's son is working during the day and pt does not have someone to stay with him or assist him physically    Co-evaluation               AM-PAC PT "6 Clicks" Mobility  Outcome Measure Help needed turning from your back to your side while in a flat bed without using bedrails?: A Lot Help needed moving from lying on your back to sitting on the side of a flat bed without using bedrails?: A Lot Help needed moving to and from a bed to a chair (including a wheelchair)?: A Lot Help needed standing up from a chair using your arms (e.g., wheelchair or bedside chair)?: Total Help needed to walk in hospital room?: Total Help needed climbing 3-5 steps with a railing? : Total 6 Click Score: 9  End of Session Equipment Utilized During Treatment: Back brace Activity Tolerance: Patient tolerated treatment well Patient left: in bed;with call bell/phone within reach;with bed alarm set Nurse Communication: Mobility status;Need for lift equipment(Bari Stedy) PT Visit Diagnosis: Muscle weakness (generalized) (M62.81);Difficulty in walking, not elsewhere classified (R26.2);History of falling (Z91.81)    Time: 1205-1300 PT Time Calculation (min) (ACUTE ONLY): 55  min   Charges:   PT Evaluation $PT Eval Moderate Complexity: 1 Mod PT Treatments $Therapeutic Activity: 23-37 mins        Kipp Brood, PT, DPT Physical Therapist with [facility]  02/01/2019 1:26 PM

## 2019-02-01 NOTE — Progress Notes (Signed)
PROGRESS NOTE  Gregory Hansen [id number] DOB: Jan 28, 1951   PCP: [name], DO  Patient is from: Home.  Independently ambulates at baseline.  Lives with his son.  DOA: 01/26/2019 LOS: 6  Brief Narrative / Interim history: 68 year old male with history of CAD/MI s/p CABG x4 in 2015, ankylosing spondylitis, HTN, DM-2, alcohol use disorder, depression, GERD and peripheral neuropathy brought to ED by EMS after found down at home by [employer].  Reportedly felt weak and lightheaded and fell walking up the steps at home and could not get up for about 30 minutes until the [employer] found him down.  Denies hitting his head or loss of consciousness.  Reportedly had lower extremity weakness, muscle pains and fatigue for sometimes.  Nausea but no emesis.  Endorses 3-4 shots of bourbon per day.  No history of DT or alcohol withdrawal seizure.  Denies smoking cigarettes or drug use.  Denies Tylenol.  In ED, afebrile and HDS.  Hgb 10.6.  Platelets 61.  Sodium 128.  CO2 17.  K3.4.  Mg 1.5.  Cr 2.92.  BUN 39.  AST 1632.  ALT 374.  ALP 246.  CK 33,000.  LDH 1200.  High-sensitivity troponin 26.  EKG NSR with nonspecific IVCD but no acute ischemic finding.  CT lumbar spine revealed acute burst fracture of L1 without body height loss of spinal cord involvement.  Neurosurgery consulted and recommended TLSO and outpatient follow-up in 2 weeks.  Started on aggressive IV fluids hydration and electrolyte replacement and admitted for fall, rhabdo, AKI, elevated liver enzymes, hyponatremia and lumbar fracture.  Assessment & Plan: Fall at home/L1 fracture/Traumatic rhabdomyolysis: felt weak, lightheaded and fell.  No head impaction or LOC. -Alcohol could have contributed to his fall in addition to his underlying lower extremity weakness and muscle pain -CK 33,000 (admit)>>> 5000> 3120. -L1 burst fracture w/o body height loss or spinal cord involvement-TLSO and outpatient follow-up in 2 wks per NS   -Stop IV fluids. -History suspicious for statin intolerance-he is on very high-dose-could benefit from PCSK9 inhibitors -As needed pain meds with good bowel regimen for pain control  AKI with azotemia: Cr 1.0 (baseline)> 2.92 (admit)> 2.85>1.29> 1.09> 0.87-suspect ATN due to rhabdo.  Good UOP.  Stop IV fluids.  Non-anion gap metabolic acidosis: Resolved.  Elevated liver enzymes/elevated ALP/hyperbilirubinemia/hepatic steatosis/alcoholic hepatitis/shock liver: Likely either due to rhabdo vs EtOH and statin or shock liver due to being on the floor.  RUQ [place] with hepatic steatosis.  Improving. -Counseled about alcohol-confident about cessation -Would avoid statin in near future.  Consider resuming back after some time. -Continue monitoring.    Alcohol use disorder: Reports drinking at least 3-4 bourbons a day.  -Encourage cessation-determined to quit -No signs of withdrawal.  Continue CIWA protocol with Ativan -Multivitamins, thiamine and folic acid  Hyponatremia: beer potomania, AKI and possibly SSRI.  Resolved  Hypokalemia/hypomagnesemia Resolved.  Elevated PT/INR/coagulopathy: Likely due to acute hepatitis.  Improved -Continue monitoring  Anemia of chronic disease: Hgb 11-12 (baseline)> 10.6 admit> 10.1>9.5> 9.8. No melena or hematochezia.  Stable. -Continue monitoring  Elevated troponin/BNP/history of CAD/CABG x4 in 2015: lightheadedness and weakness likely from alcohol.  No cardiopulmonary symptoms now.  Mild troponin and BNP elevation likely delayed clearance.  EKG NSR with nonspecific IVCD but no ischemic finding -Continue monitoring -Continue home cardiac meds except statin-suspect intolerance -Outpatient cardiology follow-up for PCSK9 inhibitors  Controlled DM-2: A1c 5.1%.  CBG within fair range.  Has not needed SSI.  -Discontinued CBG monitoring and SSI -BMP  glucose within normal  Essential hypertension: Blood pressure controlled.  Will resume losartan now that his AKI  has resolved.  Continue home dose of metoprolol and amlodipine 5 mg that was started here.  History of ankylosing spondylitis: Not on medications. -Outpatient follow-up  Depression: Stable -Continue home Wellbutrin and SSRI.  Bilateral lower extremity weakness/muscle pain: suspect this to be due to statin.  His alcohol could contribute.  CRP, ESR and aldolase elevated.  Consult PT OT to assess his strength.  Bilateral lower extremity edema: Could be due to fluid overload.  Stopping IV fluids.  No distress or dyspnea.  Avoid diuretics due to recent AKI which has resolved now.  Will reassess tomorrow.   DVT prophylaxis: Subcu heparin Code Status: Full code Family Communication: No family member present.  Plan of care discussed with patient who verbalized understanding. Disposition Plan: Remains inpatient.  Will consult PT OT to assess. Consultants: None  Subjective: Seen and examined.  Feels much better.  Other than generalized weakness, no other complaint.  Objective: Vitals:   01/30/19 2214 01/31/19 1333 01/31/19 2209 02/01/19 0645  BP: (!) 153/81 (!) 144/75 (!) 144/79 (!) 162/82  Pulse: 75 79 77 81  Resp: '18 18 20 20  ' Temp: 99 F (37.2 C) 98.2 F (36.8 C) 98.1 F (36.7 C) 98.1 F (36.7 C)  TempSrc: Oral Oral Oral Oral  SpO2: 95% 100% 97% 99%  Weight:      Height:        Intake/Output Summary (Last 24 hours) at 02/01/2019 1054 Last data filed at 02/01/2019 0640 Gross per 24 hour  Intake 2976.38 ml  Output 2050 ml  Net 926.38 ml   Filed Weights   01/26/19 2107 01/27/19 0500  Weight: 119.1 kg 118.9 kg    Examination:  General exam: Appears calm and comfortable  Respiratory system: Clear to auscultation. Respiratory effort normal. Cardiovascular system: S1 & S2 heard, RRR. No JVD, murmurs, rubs, gallops or clicks.  +1 pitting edema bilateral lower extremity Gastrointestinal system: Abdomen is nondistended, soft and nontender. No organomegaly or masses felt. Normal  bowel sounds heard. Central nervous system: Alert and oriented. No focal neurological deficits. Extremities: Symmetric 5 x 5 power. Skin: No rashes, lesions or ulcers.  Psychiatry: Judgement and insight appear normal. Mood & affect appropriate.   Procedures:  None  Microbiology summarized: NATFT-73 negative. MRSA PCR negative.  Sch Meds:  Scheduled Meds: . amLODipine  5 mg Oral Daily  . buPROPion  150 mg Oral QODAY  . chlorhexidine  15 mL Mouth Rinse BID  . docusate sodium  100 mg Oral BID  . fluticasone  1 spray Each Nare Daily  . folic acid  1 mg Oral Daily  . heparin injection (subcutaneous)  5,000 Units Subcutaneous Q8H  . mouth rinse  15 mL Mouth Rinse q12n4p  . metoprolol succinate  50 mg Oral Daily  . multivitamin with minerals  1 tablet Oral Daily  . pantoprazole  40 mg Oral Daily  . polyethylene glycol  17 g Oral Daily  . senna  1 tablet Oral BID  . thiamine  100 mg Oral Daily  . vitamin B-12  1,000 mcg Oral Daily   Continuous Infusions: . sodium chloride 100 mL/hr at 02/01/19 0049  . magnesium sulfate bolus IVPB 2 g (02/01/19 1018)   PRN Meds:.LORazepam **OR** LORazepam, ondansetron **OR** ondansetron (ZOFRAN) IV, oxyCODONE  Antimicrobials: Anti-infectives (From admission, onward)   None       I have personally reviewed the following labs  and images: CBC: Recent Labs  Lab 01/26/19 1702 01/27/19 0523 01/28/19 0550 01/29/19 0546 01/30/19 0602 02/01/19 0512  WBC 6.4 7.0 6.1 7.5 7.1 6.6  NEUTROABS 5.1  --   --   --   --  3.8  HGB 10.6* 10.1* 10.0* 10.1* 9.5* 9.8*  HCT 32.4* 31.6* 31.1* 32.6* 29.6* 31.7*  MCV 91.3 94.0 96.0 97.0 96.1 99.4  PLT 61* 66* 71* 89* 98* 124*   BMP &GFR Recent Labs  Lab 01/26/19 2139  01/28/19 0550 01/29/19 0546 01/30/19 0602 01/31/19 0601 02/01/19 0512  NA  --    < > 131* 135 136 136 138  K  --    < > 3.6 3.6 3.7 3.9 3.7  CL  --    < > 106 110 114* 113* 115*  CO2  --    < > 16* 17* 17* 17* 18*  GLUCOSE  --    <  > 101* 107* 111* 118* 123*  BUN  --    < > 38* 32* 26* 25* 20  CREATININE  --    < > 2.41* 1.83* 1.29* 1.09 0.87  CALCIUM  --    < > 6.9* 7.1* 7.4* 7.6* 7.6*  MG 1.5*  --  1.3* 1.6* 1.4* 1.5* 1.1*  PHOS 3.4  --  2.7  --   --  2.3*  --    < > = values in this interval not displayed.   Estimated Creatinine Clearance: 109.8 mL/min (by C-G formula based on SCr of 0.87 mg/dL). Liver & Pancreas: Recent Labs  Lab 01/28/19 0550 01/29/19 0546 01/30/19 0602 01/31/19 0601 02/01/19 0512  AST 1,174* 887* 619* 446* 297*  ALT 325* 302* 254* 226* 189*  ALKPHOS 221* 231* 210* 201* 181*  BILITOT 4.6* 4.2* 3.1* 3.0* 2.4*  PROT 6.0* 6.0* 5.4* 5.7* 5.5*  ALBUMIN 2.1* 2.2* 1.8* 2.0* 1.8*   No results for input(s): LIPASE, AMYLASE in the last 168 hours. No results for input(s): AMMONIA in the last 168 hours. Diabetic: No results for input(s): HGBA1C in the last 72 hours. Recent Labs  Lab 01/30/19 0736 01/30/19 1201 01/30/19 1700 01/31/19 0731 01/31/19 1650  GLUCAP 104* 110* 86 93 90   Cardiac Enzymes: Recent Labs  Lab 01/27/19 1549 01/28/19 0550 01/29/19 0546 01/30/19 0602 01/31/19 0601  CKTOTAL 20,073* 12,039* 7,304* 4,894* 3,120*   No results for input(s): PROBNP in the last 8760 hours. Coagulation Profile: Recent Labs  Lab 01/26/19 2139 01/27/19 0523 01/28/19 0550 01/30/19 0602 01/31/19 0601  INR 1.4* 1.4* 1.4* 1.3* 1.2   Thyroid Function Tests: No results for input(s): TSH, T4TOTAL, FREET4, T3FREE, THYROIDAB in the last 72 hours. Lipid Profile: No results for input(s): CHOL, HDL, LDLCALC, TRIG, CHOLHDL, LDLDIRECT in the last 72 hours. Anemia Panel: No results for input(s): VITAMINB12, FOLATE, FERRITIN, TIBC, IRON, RETICCTPCT in the last 72 hours. Urine analysis:    Component Value Date/Time   COLORURINE YELLOW 01/26/2019 2242   APPEARANCEUR CLEAR 01/26/2019 2242   LABSPEC 1.008 01/26/2019 2242   PHURINE 6.0 01/26/2019 2242   GLUCOSEU NEGATIVE 01/26/2019 2242    HGBUR LARGE (A) 01/26/2019 2242   [place] NEGATIVE 01/26/2019 [place] 01/26/2019 2242   PROTEINUR 100 (A) 01/26/2019 2242   UROBILINOGEN 1.0 10/28/2013 1857   NITRITE NEGATIVE 01/26/2019 2242   LEUKOCYTESUR NEGATIVE 01/26/2019 2242   Sepsis Labs: Invalid input(s): PROCALCITONIN, [place]  Microbiology: Recent Results (from the past 240 hour(s))  SARS CORONAVIRUS 2 (TAT 6-24 HRS) Nasopharyngeal Nasopharyngeal  Swab     Status: None   Collection Time: 01/26/19  7:16 PM   Specimen: Nasopharyngeal Swab  Result Value Ref Range Status   SARS Coronavirus 2 NEGATIVE NEGATIVE Final    Comment: (NOTE) SARS-CoV-2 target nucleic acids are NOT DETECTED. The SARS-CoV-2 RNA is generally detectable in upper and lower respiratory specimens during the acute phase of infection. Negative results do not preclude SARS-CoV-2 infection, do not rule out co-infections with other pathogens, and should not be used as the sole basis for treatment or other patient management decisions. Negative results must be combined with clinical observations, patient history, and epidemiological information. The expected result is Negative. Fact Sheet for Patients: SugarRoll.be Fact Sheet for Healthcare Providers: https://www.woods-mathews.com/ This test is not yet approved or cleared by the [place] FDA and  has been authorized for detection and/or diagnosis of SARS-CoV-2 by FDA under an Emergency Use Authorization (EUA). This EUA will remain  in effect (meaning this test can be used) for the duration of the COVID-19 declaration under Section 56 4(b)(1) of the Act, 21 U.S.C. section 360bbb-3(b)(1), unless the authorization is terminated or revoked sooner. Performed at [facility], [place] [address], [place], [place] [address]   MRSA PCR Screening     Status: None   Collection Time: 01/27/19 10:00 AM   Specimen: Nasal Mucosa; Nasopharyngeal   Result Value Ref Range Status   MRSA by PCR NEGATIVE NEGATIVE Final    Comment:        The GeneXpert MRSA Assay (FDA approved for NASAL specimens only), is one component of a comprehensive MRSA colonization surveillance program. It is not intended to diagnose MRSA infection nor to guide or monitor treatment for MRSA infections. Performed at [facility], [place] [address], [place], [place] [address]     Radiology Studies: No results found.  Darliss Cheney, MD [facility]  If 7PM-7AM, please contact night-coverage [email or web address] Password [facility] 02/01/2019, 10:54 AM

## 2019-02-01 NOTE — Progress Notes (Signed)
Rehab Admissions Coordinator Note:  Per PT recommendation, patient was screened by Michel Santee for appropriateness for an Inpatient Acute Rehab Consult.  At this time, we are recommending Inpatient Rehab consult.  I will place a rehab consult order so that we are better able to assess pt's potential candidacy.   Michel Santee 02/01/2019, 3:47 PM  I can be reached at [facility], [place], DPT Admissions Coordinator [phone] 02/01/19  3:48 PM

## 2019-02-01 NOTE — Care Management Important Message (Signed)
Important Message  Patient Details IM Letter given to Marney Doctor RN to present to the Patient Name: Gregory Hansen MRN: [id number] Date of Birth: 1950-10-21   Medicare Important Message Given:  Yes     [name] 02/01/2019, 9:51 AM

## 2019-02-02 DIAGNOSIS — W19XXXA Unspecified fall, initial encounter: Secondary | ICD-10-CM

## 2019-02-02 DIAGNOSIS — Y92009 Unspecified place in unspecified non-institutional (private) residence as the place of occurrence of the external cause: Secondary | ICD-10-CM

## 2019-02-02 DIAGNOSIS — M6282 Rhabdomyolysis: Secondary | ICD-10-CM

## 2019-02-02 DIAGNOSIS — R600 Localized edema: Secondary | ICD-10-CM | POA: Diagnosis present

## 2019-02-02 HISTORY — DX: Rhabdomyolysis: M62.82

## 2019-02-02 LAB — COMPREHENSIVE METABOLIC PANEL
ALT: 164 U/L — ABNORMAL HIGH (ref 0–44)
AST: 219 U/L — ABNORMAL HIGH (ref 15–41)
Albumin: 1.9 g/dL — ABNORMAL LOW (ref 3.5–5.0)
Alkaline Phosphatase: 154 U/L — ABNORMAL HIGH (ref 38–126)
Anion gap: 5 (ref 5–15)
BUN: 19 mg/dL (ref 8–23)
CO2: 18 mmol/L — ABNORMAL LOW (ref 22–32)
Calcium: 7.8 mg/dL — ABNORMAL LOW (ref 8.9–10.3)
Chloride: 112 mmol/L — ABNORMAL HIGH (ref 98–111)
Creatinine, Ser: 0.84 mg/dL (ref 0.61–1.24)
GFR calc Af Amer: >60 mL/min (ref >60)
GFR calc non Af Amer: >60 mL/min (ref >60)
Glucose, Bld: 101 mg/dL — ABNORMAL HIGH (ref 70–99)
Potassium: 4 mmol/L (ref 3.5–5.1)
Sodium: 135 mmol/L (ref 135–145)
Total Bilirubin: 2.5 mg/dL — ABNORMAL HIGH (ref 0.3–1.2)
Total Protein: 5.2 g/dL — ABNORMAL LOW (ref 6.5–8.1)

## 2019-02-02 LAB — CBC WITH DIFFERENTIAL/PLATELET
Abs Immature Granulocytes: 0.04 10*3/uL (ref 0.00–0.07)
Basophils Absolute: 0.1 10*3/uL (ref 0.0–0.1)
Basophils Relative: 1 %
Eosinophils Absolute: 0.4 10*3/uL (ref 0.0–0.5)
Eosinophils Relative: 7 %
HCT: 32.7 % — ABNORMAL LOW (ref 39.0–52.0)
Hemoglobin: 10 g/dL — ABNORMAL LOW (ref 13.0–17.0)
Immature Granulocytes: 1 %
Lymphocytes Relative: 21 %
Lymphs Abs: 1.2 10*3/uL (ref 0.7–4.0)
MCH: 31 pg (ref 26.0–34.0)
MCHC: 30.6 g/dL (ref 30.0–36.0)
MCV: 101.2 fL — ABNORMAL HIGH (ref 80.0–100.0)
Monocytes Absolute: 1.1 10*3/uL — ABNORMAL HIGH (ref 0.1–1.0)
Monocytes Relative: 18 %
Neutro Abs: 3 10*3/uL (ref 1.7–7.7)
Neutrophils Relative %: 52 %
Platelets: 140 10*3/uL — ABNORMAL LOW (ref 150–400)
RBC: 3.23 MIL/uL — ABNORMAL LOW (ref 4.22–5.81)
RDW: 24.6 % — ABNORMAL HIGH (ref 11.5–15.5)
WBC: 5.7 10*3/uL (ref 4.0–10.5)
nRBC: 0 % (ref 0.0–0.2)

## 2019-02-02 LAB — MAGNESIUM: Magnesium: 1.5 mg/dL — ABNORMAL LOW (ref 1.7–2.4)

## 2019-02-02 MED ORDER — MAGNESIUM SULFATE 2 GM/50ML IV SOLN
2.0000 g | Freq: Once | INTRAVENOUS | Status: AC
  Administered 2019-02-02: 08:00:00 2 g via INTRAVENOUS
  Filled 2019-02-02: qty 50

## 2019-02-02 NOTE — Progress Notes (Signed)
Physical Therapy Treatment Patient Details Name: Gregory Hansen MRN: [id number] DOB: 1950-06-26 Today's Date: 02/02/2019    History of Present Illness Gregory Hansen is a 68 y.o. male with medical history significant for CAD/MI status post CABG, ankylosing spondylitis, hypertension, diabetes mellitus type 2 and alcohol abuse who presented to the ED from home after a mechanical fall in his garage.  Patient reports that he was walking up the steps into his home when he felt weak and lightheaded and fell backward onto his back. He states that he was down on the ground for approximately 1 hour unable to get up and he was assisted into his home by [place] delivery man.  He endorses pain in his back and weakness in his lower extremities.  He reports that his weakness has been ongoing now for some time and is associated with fatigue.  He has also noticed increased abdominal swelling. Pt found to have acute burst fracture of T1 and is being treated conservatively with TLSO brace.    PT Comments    Pt demonstrated good motivation during today's treatment. Pt was very anxious and OCD during treatment. Pt was pleasant for treatment. General bed mobility comments: Pt required mod assist with LE to put them on and off the bed. Pt required VC's to proper log rolling technique General transfer comment: Pt was able to perform a sit to stand with mod-max 2+ assist. Pt required VC's to push up from the bed/recliner. Once standing pt required CGA for steadying and safety General Gait Details: Pt was able to ambulate 67ft with a beriatric RW and one sitting rest break. Pt LE are very weak. Pt required VC's to stand closer to the walker and to keep his head up. Pt would benefit from CIR at [facility] to continue to regain strength in LE and build stamina.   Follow Up Recommendations  CIR     Equipment Recommendations  Other (comment)(beriatric walker and 3:1)    Recommendations for Other Services       Precautions  / Restrictions Precautions Precautions: Fall Required Braces or Orthoses: Spinal Brace Spinal Brace: Thoracolumbosacral orthotic;Applied in sitting position Spinal Brace Comments: TLSO for OOB/ambulation Restrictions Weight Bearing Restrictions: No    Mobility  Bed Mobility Overal bed mobility: Needs Assistance Bed Mobility: Rolling;Sidelying to Sit;Sit to Sidelying Rolling: Mod assist Sidelying to sit: Mod assist     Sit to sidelying: Mod assist General bed mobility comments: Pt required mod assist with LE to put them on and off the bed. Pt required VC's to proper log rolling technique  Transfers Overall transfer level: Needs assistance Equipment used: Rolling walker (2 wheeled) Transfers: Sit to/from Stand Sit to Stand: From elevated surface;+2 safety/equipment;+2 physical assistance         General transfer comment: Pt was able to perform a sit to stand with mod-max 2+ assist. Pt required VC's to push up from the bed/recliner. Once standing pt required CGA for steadying and safety  Ambulation/Gait Ambulation/Gait assistance: Min assist;Min guard Gait Distance (Feet): 40 Feet Assistive device: Rolling walker (2 wheeled) Gait Pattern/deviations: Step-through pattern;Shuffle;Decreased stride length;Trunk flexed     General Gait Details: Pt was able to ambulate 65ft with a beriatric RW and one sitting rest break. Pt LE are very weak. Pt required VC's to stand closer to the walker and to keep his head up   Stairs             Wheelchair Mobility    Modified Rankin (Stroke  Patients Only)       Balance                                            Cognition Arousal/Alertness: Awake/alert Behavior During Therapy: WFL for tasks assessed/performed;Anxious Overall Cognitive Status: Within Functional Limits for tasks assessed                                 General Comments: Pt presents with bruising and scabs along knees and  forearms. pt reports being claustrophobic and became anxious with TLSO brace and standing      Exercises      General Comments        Pertinent Vitals/Pain Pain Assessment: 0-10 Pain Score: 7  Pain Location: low back Pain Descriptors / Indicators: Sore;Aching;Discomfort Pain Intervention(s): Monitored during session;Repositioned    Home Living Family/patient expects to be discharged to:: Private residence Living Arrangements: Children(pt's son) Available Help at Discharge: Family;Available PRN/intermittently(pt's on works out of home from 9am-6pm) Type of Home: House Home Access: Stairs to enter Entrance Stairs-Rails: None Home Layout: One level Home Equipment: [place] - single point      Prior Function Level of Independence: Independent      Comments: pt reports he was occasionally using SPC to mobilize   PT Goals (current goals can now be found in the care plan section) Acute Rehab PT Goals Patient Stated Goal: to be able to get up and walk again Progress towards PT goals: Progressing toward goals    Frequency    Min 3X/week      PT Plan Current plan remains appropriate    Co-evaluation              AM-PAC PT "6 Clicks" Mobility   Outcome Measure  Help needed turning from your back to your side while in a flat bed without using bedrails?: A Lot Help needed moving from lying on your back to sitting on the side of a flat bed without using bedrails?: A Lot Help needed moving to and from a bed to a chair (including a wheelchair)?: A Lot Help needed standing up from a chair using your arms (e.g., wheelchair or bedside chair)?: A Lot Help needed to walk in hospital room?: A Little Help needed climbing 3-5 steps with a railing? : Total 6 Click Score: 12    End of Session Equipment Utilized During Treatment: Back brace;Gait belt Activity Tolerance: Patient tolerated treatment well Patient left: in bed;with call bell/phone within reach;with bed alarm  set Nurse Communication: Mobility status PT Visit Diagnosis: Muscle weakness (generalized) (M62.81);Difficulty in walking, not elsewhere classified (R26.2);History of falling (Z91.81)     Time: [id number] PT Time Calculation (min) (ACUTE ONLY): 28 min  Charges:  $Gait Training: 8-22 mins $Therapeutic Activity: 8-22 mins                     Excell Seltzer, [place]

## 2019-02-02 NOTE — Evaluation (Signed)
Occupational Therapy Evaluation Patient Details Name: Gregory Hansen MRN: [id number] DOB: 1950-10-20 Today's Date: 02/02/2019    History of Present Illness Gregory Hansen is a 68 y.o. male with medical history significant for CAD/MI status post CABG, ankylosing spondylitis, hypertension, diabetes mellitus type 2 and alcohol abuse who presented to the ED from home after a mechanical fall in his garage.  Patient reports that he was walking up the steps into his home when he felt weak and lightheaded and fell backward onto his back. He states that he was down on the ground for approximately 1 hour unable to get up and he was assisted into his home by an [place] delivery man.  He endorses pain in his back and weakness in his lower extremities.  He reports that his weakness has been ongoing now for some time and is associated with fatigue.  He has also noticed increased abdominal swelling. Pt found to have acute burst fracture of T1 and is being treated conservatively with TLSO brace.   Clinical Impression   Pt admitted with a fall in which he suffered an acute burst fracture. Pt currently with functional limitations due to the deficits listed below (see OT Problem List).  Pt will benefit from skilled OT to increase their safety and independence with ADL and functional mobility for ADL to facilitate discharge to venue listed below.      Follow Up Recommendations  CIR    Equipment Recommendations  None recommended by OT    Recommendations for Other Services       Precautions / Restrictions Precautions Precautions: Fall Required Braces or Orthoses: Spinal Brace Spinal Brace: Thoracolumbosacral orthotic;Applied in sitting position;Other (comment) Spinal Brace Comments: TLSO for OOB/ambulation      Mobility Bed Mobility Overal bed mobility: Needs Assistance Bed Mobility: Rolling Rolling: Mod assist         General bed mobility comments: pt with significant pain with rolling for  hygiene  Transfers          NT                ADL either performed or assessed with clinical judgement   ADL Overall ADL's : Needs assistance/impaired Eating/Feeding: Set up;Bed level   Grooming: Set up;Bed level                       Toileting- Clothing Manipulation and Hygiene: Bed level;Total assistance;Cueing for safety Toileting - Clothing Manipulation Details (indicate cue type and reason): pt had BM in bed pan.  Bed mobility performed for hygiene       General ADL Comments: pts pain 9/10 after toileting.  Rolling L and R for hygiene after using bed pan.     Vision Baseline Vision/History: Wears glasses Patient Visual Report: No change from baseline              Pertinent Vitals/Pain Pain Score: 9  Pain Location: low back Pain Descriptors / Indicators: Aching;Discomfort;Grimacing;Guarding Pain Intervention(s): Limited activity within patient's tolerance;Monitored during session     Hand Dominance Right   Extremity/Trunk Assessment         Cervical / Trunk Assessment Cervical / Trunk Assessment: Normal   Communication Communication Communication: No difficulties   Cognition Arousal/Alertness: Awake/alert Behavior During Therapy: WFL for tasks assessed/performed;Anxious Overall Cognitive Status: Within Functional Limits for tasks assessed  General Comments: pt's with bruising and scabs along knees and forearms. pt reports being claustrophobic and became anxious with TLSO brace initially and with [place] expects to be discharged to:: Private residence Living Arrangements: Children(pt's son) Available Help at Discharge: Family;Available PRN/intermittently(pt's on works out of home from 9am-6pm) Type of Home: House Home Access: Stairs to enter [employer] of Steps: 2 at front 3 from garage Entrance Stairs-Rails: None Home Layout: One  level     Bathroom Shower/Tub: Occupational psychologist: Handicapped height Bathroom Accessibility: Yes   Home Equipment: [place] - single point          Prior Functioning/Environment Level of Independence: Independent        Comments: pt reports he was occasionally using SPC to mobilize        OT Problem List: Decreased strength;Decreased activity tolerance;Impaired balance (sitting and/or standing);Decreased knowledge of use of DME or AE;Decreased knowledge of precautions;Pain;Obesity      OT Treatment/Interventions: Self-care/ADL training;Patient/family education;Therapeutic activities;DME and/or AE instruction    OT Goals(Current goals can be found in the care plan section) Acute Rehab OT Goals Patient Stated Goal: to be able to get up and walk again OT Goal Formulation: With patient Time For Goal Achievement: 02/09/19 ADL Goals Pt Will Perform Grooming: with modified independence;standing Pt Will Perform Lower Body Dressing: with modified independence;sit to/from stand Pt Will Transfer to Toilet: ambulating;with supervision Pt Will Perform Toileting - Clothing Manipulation and hygiene: with modified independence;sit to/from stand  OT Frequency: Min 2X/week    AM-PAC OT "6 Clicks" Daily Activity     Outcome Measure Help from another person eating meals?: A Little Help from another person taking care of personal grooming?: A Little Help from another person toileting, which includes using toliet, bedpan, or urinal?: Total Help from another person bathing (including washing, rinsing, drying)?: A Lot Help from another person to put on and taking off regular upper body clothing?: A Little Help from another person to put on and taking off regular lower body clothing?: Total 6 Click Score: 13   End of Session Nurse Communication: Mobility status;Need for lift equipment  Activity Tolerance: Patient limited by pain Patient left: in bed;with call bell/phone within  reach  OT Visit Diagnosis: Unsteadiness on feet (R26.81);Other abnormalities of gait and mobility (R26.89);Muscle weakness (generalized) (M62.81);History of falling (Z91.81);Pain Pain - part of body: (back)                Time: 1240-1305 OT Time Calculation (min): 25 min Charges:  OT General Charges $OT Visit: 1 Visit OT Evaluation $OT Eval Moderate Complexity: 1 Mod  Kari Baars, [place] [phone] Office- [phone], Edwena Felty D 02/02/2019, 1:38 PM

## 2019-02-02 NOTE — Progress Notes (Signed)
Inpatient Rehab Admissions:  Inpatient Rehab Consult received.  I spoke to pt and his daughter separately over the phone for rehabilitation assessment and to discuss goals and expectations of an inpatient rehab admission.  Pt states that he doesn't have someone to provide 24/7, but his daughter, [name], states that she is able to work from home and sit with him if needed.  Feel he will make excellent progress with CIR and anticipate he can be intermittently mod I at home.  Will open insurance for authorization for possible admission pending approval.   Signed: Shann Medal, PT, DPT Admissions Coordinator [phone] 02/02/19  12:29 PM

## 2019-02-02 NOTE — Progress Notes (Signed)
PROGRESS NOTE  Gregory Hansen ESP:[id number] DOB: March 20, 1950   PCP: [name], DO  Patient is from: Home.  Independently ambulates at baseline.  Lives with his son.  DOA: 01/26/2019 LOS: 7  Brief Narrative / Interim history: 68 year old male with history of CAD/MI s/p CABG x4 in 2015, ankylosing spondylitis, HTN, DM-2, alcohol use disorder, depression, GERD and peripheral neuropathy brought to ED by EMS after found down at home by [employer].  Reportedly felt weak and lightheaded and fell walking up the steps at home and could not get up for about 30 minutes until the [employer] found him down.  Denies hitting his head or loss of consciousness.  Reportedly had lower extremity weakness, muscle pains and fatigue for sometimes.  Nausea but no emesis.  Endorses 3-4 shots of bourbon per day.  No history of DT or alcohol withdrawal seizure.  Denies smoking cigarettes or drug use.  Denies Tylenol.  In ED, afebrile and HDS.  Hgb 10.6.  Platelets 61.  Sodium 128.  CO2 17.  K3.4.  Mg 1.5.  Cr 2.92.  BUN 39.  AST 1632.  ALT 374.  ALP 246.  CK 33,000.  LDH 1200.  High-sensitivity troponin 26.  EKG NSR with nonspecific IVCD but no acute ischemic finding.  CT lumbar spine revealed acute burst fracture of L1 without body height loss of spinal cord involvement.  Neurosurgery consulted and recommended TLSO and outpatient follow-up in 2 weeks.  Started on aggressive IV fluids hydration and electrolyte replacement and admitted for fall, rhabdo, AKI, elevated liver enzymes, hyponatremia and lumbar fracture.  Assessment & Plan: Fall at home/L1 fracture/Traumatic rhabdomyolysis: felt weak, lightheaded and fell.  No head impaction or LOC. -Alcohol could have contributed to his fall in addition to his underlying lower extremity weakness and muscle pain -CK 33,000 (admit)>>> 5000> 3120. -L1 burst fracture w/o body height loss or spinal cord involvement-TLSO and outpatient follow-up in 2 wks per NS   -Stop IV fluids. -History suspicious for statin intolerance-he is on very high-dose-could benefit from PCSK9 inhibitors -As needed pain meds with good bowel regimen for pain control  AKI with azotemia: Cr 1.0 (baseline)> 2.92 (admit)> 2.85>1.29> 1.09> 0.87-suspect ATN due to rhabdo.  Good UOP.   Non-anion gap metabolic acidosis: Resolved.  Elevated liver enzymes/elevated ALP/hyperbilirubinemia/hepatic steatosis/alcoholic hepatitis/shock liver: Likely either due to rhabdo vs EtOH and statin or shock liver due to being on the floor.  RUQ [place] with hepatic steatosis.  Improving. -Counseled about alcohol-confident about cessation -Would avoid statin in near future.  Consider resuming back after some time. -Continue monitoring.    Alcohol use disorder: Reports drinking at least 3-4 bourbons a day.  -Encourage cessation-determined to quit -No signs of withdrawal.  Continue CIWA protocol with Ativan -Multivitamins, thiamine and folic acid  Hyponatremia: beer potomania, AKI and possibly SSRI.  Resolved  Hypokalemia/hypomagnesemia Magnesium 1.5 again today.  Will replace through IV.  Hypokalemia resolved.  Elevated PT/INR/coagulopathy: Likely due to acute hepatitis.  Improved -Continue monitoring  Anemia of chronic disease: Hgb 11-12 (baseline)> 10.6 admit> 10.1>9.5> 9.8. No melena or hematochezia.  Stable. -Continue monitoring  Elevated troponin/BNP/history of CAD/CABG x4 in 2015: lightheadedness and weakness likely from alcohol.  No cardiopulmonary symptoms now.  Mild troponin and BNP elevation likely delayed clearance.  EKG NSR with nonspecific IVCD but no ischemic finding -Continue monitoring -Continue home cardiac meds except statin-suspect intolerance -Outpatient cardiology follow-up for PCSK9 inhibitors  Controlled DM-2: A1c 5.1%.  CBG within fair range.  Has not needed  SSI.  -Discontinued CBG monitoring and SSI -BMP glucose within normal  Essential hypertension: Blood pressure  controlled.  Will resume losartan now that his AKI has resolved.  Continue home dose of metoprolol and amlodipine 5 mg that was started here.  History of ankylosing spondylitis: Not on medications. -Outpatient follow-up  Depression: Stable -Continue home Wellbutrin and SSRI.  Bilateral lower extremity weakness/muscle pain: suspect this to be due to statin.  His alcohol could contribute.  CRP, ESR and aldolase elevated.  Seen by PT OT and they recommend inpatient rehab.  Insurance authorization and process.  Bilateral lower extremity edema: Could be due to fluid overload.  We will give him 1 dose of IV Lasix 20 mg to be gentle on kidneys.   DVT prophylaxis: Subcu heparin Code Status: Full code Family Communication: No family member present.  Plan of care discussed with patient who verbalized understanding. Disposition Plan: Discharge to CIR once insurance approval obtained.. Consultants: None  Subjective: Seen and examined.  No complaints other than lower extremity swelling.  Objective: Vitals:   02/01/19 0645 02/01/19 1536 02/01/19 2121 02/02/19 0607  BP: (!) 162/82 (!) 150/74 (!) 145/67 (!) 154/74  Pulse: 81 77 75 77  Resp: '20 18 16 20  ' Temp: 98.1 F (36.7 C) 98.3 F (36.8 C) 98.3 F (36.8 C) 97.7 F (36.5 C)  TempSrc: Oral Oral Oral Oral  SpO2: 99% 98% 99% 99%  Weight:    131.2 kg  Height:        Intake/Output Summary (Last 24 hours) at 02/02/2019 0858 Last data filed at 02/02/2019 0730 Gross per 24 hour  Intake 120 ml  Output 1100 ml  Net -980 ml   Filed Weights   01/26/19 2107 01/27/19 0500 02/02/19 6294  Weight: 119.1 kg 118.9 kg 131.2 kg    Examination:  General exam: Appears calm and comfortable  Respiratory system: Clear to auscultation. Respiratory effort normal. Cardiovascular system: S1 & S2 heard, RRR. No JVD, murmurs, rubs, gallops or clicks.  +2 pitting edema bilateral lower extremity Gastrointestinal system: Abdomen is nondistended, soft and  nontender. No organomegaly or masses felt. Normal bowel sounds heard. Central nervous system: Alert and oriented. No focal neurological deficits. Extremities: Symmetric 5 x 5 power. Skin: No rashes, lesions or ulcers.  Psychiatry: Judgement and insight appear normal. Mood & affect appropriate.   Procedures:  None  Microbiology summarized: TMLYY-50 negative. MRSA PCR negative.  Sch Meds:  Scheduled Meds: . amLODipine  5 mg Oral Daily  . buPROPion  150 mg Oral QODAY  . chlorhexidine  15 mL Mouth Rinse BID  . docusate sodium  100 mg Oral BID  . fluticasone  1 spray Each Nare Daily  . folic acid  1 mg Oral Daily  . heparin injection (subcutaneous)  5,000 Units Subcutaneous Q8H  . losartan  50 mg Oral Daily  . mouth rinse  15 mL Mouth Rinse q12n4p  . metoprolol succinate  50 mg Oral Daily  . multivitamin with minerals  1 tablet Oral Daily  . pantoprazole  40 mg Oral Daily  . polyethylene glycol  17 g Oral Daily  . senna  1 tablet Oral BID  . thiamine  100 mg Oral Daily  . vitamin B-12  1,000 mcg Oral Daily   Continuous Infusions: . magnesium sulfate bolus IVPB 2 g (02/02/19 0809)   PRN Meds:.LORazepam **OR** LORazepam, ondansetron **OR** ondansetron (ZOFRAN) IV, oxyCODONE  Antimicrobials: Anti-infectives (From admission, onward)   None       I  have personally reviewed the following labs and images: CBC: Recent Labs  Lab 01/26/19 1702  01/28/19 0550 01/29/19 0546 01/30/19 0602 02/01/19 0512 02/02/19 0554  WBC 6.4   < > 6.1 7.5 7.1 6.6 5.7  NEUTROABS 5.1  --   --   --   --  3.8 3.0  HGB 10.6*   < > 10.0* 10.1* 9.5* 9.8* 10.0*  HCT 32.4*   < > 31.1* 32.6* 29.6* 31.7* 32.7*  MCV 91.3   < > 96.0 97.0 96.1 99.4 101.2*  PLT 61*   < > 71* 89* 98* 124* 140*   < > = values in this interval not displayed.   BMP &GFR Recent Labs  Lab 01/26/19 2139  01/28/19 0550 01/29/19 0546 01/30/19 0602 01/31/19 0601 02/01/19 0512 02/02/19 0554  NA  --    < > 131* 135 136 136  138 135  K  --    < > 3.6 3.6 3.7 3.9 3.7 4.0  CL  --    < > 106 110 114* 113* 115* 112*  CO2  --    < > 16* 17* 17* 17* 18* 18*  GLUCOSE  --    < > 101* 107* 111* 118* 123* 101*  BUN  --    < > 38* 32* 26* 25* 20 19  CREATININE  --    < > 2.41* 1.83* 1.29* 1.09 0.87 0.84  CALCIUM  --    < > 6.9* 7.1* 7.4* 7.6* 7.6* 7.8*  MG 1.5*  --  1.3* 1.6* 1.4* 1.5* 1.1* 1.5*  PHOS 3.4  --  2.7  --   --  2.3*  --   --    < > = values in this interval not displayed.   Estimated Creatinine Clearance: 119.5 mL/min (by C-G formula based on SCr of 0.84 mg/dL). Liver & Pancreas: Recent Labs  Lab 01/29/19 0546 01/30/19 0602 01/31/19 0601 02/01/19 0512 02/02/19 0554  AST 887* 619* 446* 297* 219*  ALT 302* 254* 226* 189* 164*  ALKPHOS 231* 210* 201* 181* 154*  BILITOT 4.2* 3.1* 3.0* 2.4* 2.5*  PROT 6.0* 5.4* 5.7* 5.5* 5.2*  ALBUMIN 2.2* 1.8* 2.0* 1.8* 1.9*   No results for input(s): LIPASE, AMYLASE in the last 168 hours. No results for input(s): AMMONIA in the last 168 hours. Diabetic: No results for input(s): HGBA1C in the last 72 hours. Recent Labs  Lab 01/30/19 0736 01/30/19 1201 01/30/19 1700 01/31/19 0731 01/31/19 1650  GLUCAP 104* 110* 86 93 90   Cardiac Enzymes: Recent Labs  Lab 01/27/19 1549 01/28/19 0550 01/29/19 0546 01/30/19 0602 01/31/19 0601  CKTOTAL 20,073* 12,039* 7,304* 4,894* 3,120*   No results for input(s): PROBNP in the last 8760 hours. Coagulation Profile: Recent Labs  Lab 01/26/19 2139 01/27/19 0523 01/28/19 0550 01/30/19 0602 01/31/19 0601  INR 1.4* 1.4* 1.4* 1.3* 1.2   Thyroid Function Tests: No results for input(s): TSH, T4TOTAL, FREET4, T3FREE, THYROIDAB in the last 72 hours. Lipid Profile: No results for input(s): CHOL, HDL, LDLCALC, TRIG, CHOLHDL, LDLDIRECT in the last 72 hours. Anemia Panel: No results for input(s): VITAMINB12, FOLATE, FERRITIN, TIBC, IRON, RETICCTPCT in the last 72 hours. Urine analysis:    Component Value Date/Time    COLORURINE YELLOW 01/26/2019 2242   APPEARANCEUR CLEAR 01/26/2019 2242   LABSPEC 1.008 01/26/2019 2242   PHURINE 6.0 01/26/2019 2242   GLUCOSEU NEGATIVE 01/26/2019 2242   HGBUR LARGE (A) 01/26/2019 2242   BILIRUBINUR NEGATIVE 01/26/2019 2242   KETONESUR NEGATIVE  01/26/2019 2242   PROTEINUR 100 (A) 01/26/2019 2242   UROBILINOGEN 1.0 10/28/2013 1857   NITRITE NEGATIVE 01/26/2019 2242   LEUKOCYTESUR NEGATIVE 01/26/2019 2242   Sepsis Labs: Invalid input(s): PROCALCITONIN, [place]  Microbiology: Recent Results (from the past 240 hour(s))  SARS CORONAVIRUS 2 (TAT 6-24 HRS) Nasopharyngeal Nasopharyngeal Swab     Status: None   Collection Time: 01/26/19  7:16 PM   Specimen: Nasopharyngeal Swab  Result Value Ref Range Status   SARS Coronavirus 2 NEGATIVE NEGATIVE Final    Comment: (NOTE) SARS-CoV-2 target nucleic acids are NOT DETECTED. The SARS-CoV-2 RNA is generally detectable in upper and lower respiratory specimens during the acute phase of infection. Negative results do not preclude SARS-CoV-2 infection, do not rule out co-infections with other pathogens, and should not be used as the sole basis for treatment or other patient management decisions. Negative results must be combined with clinical observations, patient history, and epidemiological information. The expected result is Negative. Fact Sheet for Patients: SugarRoll.be Fact Sheet for Healthcare Providers: https://www.woods-mathews.com/ This test is not yet approved or cleared by the [place] FDA and  has been authorized for detection and/or diagnosis of SARS-CoV-2 by FDA under an Emergency Use Authorization (EUA). This EUA will remain  in effect (meaning this test can be used) for the duration of the COVID-19 declaration under Section 56 4(b)(1) of the Act, 21 U.S.C. section 360bbb-3(b)(1), unless the authorization is terminated or revoked sooner. Performed at [facility], [place] [address] [place] [address]   MRSA PCR Screening     Status: None   Collection Time: 01/27/19 10:00 AM   Specimen: Nasal Mucosa; Nasopharyngeal  Result Value Ref Range Status   MRSA by PCR NEGATIVE NEGATIVE Final    Comment:        The GeneXpert MRSA Assay (FDA approved for NASAL specimens only), is one component of a comprehensive MRSA colonization surveillance program. It is not intended to diagnose MRSA infection nor to guide or monitor treatment for MRSA infections. Performed at [facility], [place] [address] [place], [place] [address]     Radiology Studies: No results found.  Darliss Cheney, MD [facility]  If 7PM-7AM, please contact night-coverage [email or web address] Password [facility] 02/02/2019, 8:58 AM

## 2019-02-03 LAB — COMPREHENSIVE METABOLIC PANEL
ALT: 161 U/L — ABNORMAL HIGH (ref 0–44)
AST: 195 U/L — ABNORMAL HIGH (ref 15–41)
Albumin: 2 g/dL — ABNORMAL LOW (ref 3.5–5.0)
Alkaline Phosphatase: 178 U/L — ABNORMAL HIGH (ref 38–126)
Anion gap: 6 (ref 5–15)
BUN: 19 mg/dL (ref 8–23)
CO2: 21 mmol/L — ABNORMAL LOW (ref 22–32)
Calcium: 8.1 mg/dL — ABNORMAL LOW (ref 8.9–10.3)
Chloride: 109 mmol/L (ref 98–111)
Creatinine, Ser: 0.87 mg/dL (ref 0.61–1.24)
GFR calc Af Amer: >60 mL/min (ref >60)
GFR calc non Af Amer: >60 mL/min (ref >60)
Glucose, Bld: 100 mg/dL — ABNORMAL HIGH (ref 70–99)
Potassium: 3.6 mmol/L (ref 3.5–5.1)
Sodium: 136 mmol/L (ref 135–145)
Total Bilirubin: 2.1 mg/dL — ABNORMAL HIGH (ref 0.3–1.2)
Total Protein: 5.8 g/dL — ABNORMAL LOW (ref 6.5–8.1)

## 2019-02-03 LAB — CBC WITH DIFFERENTIAL/PLATELET
Abs Immature Granulocytes: 0.04 10*3/uL (ref 0.00–0.07)
Basophils Absolute: 0.1 10*3/uL (ref 0.0–0.1)
Basophils Relative: 2 %
Eosinophils Absolute: 0.3 10*3/uL (ref 0.0–0.5)
Eosinophils Relative: 5 %
HCT: 33.2 % — ABNORMAL LOW (ref 39.0–52.0)
Hemoglobin: 10.1 g/dL — ABNORMAL LOW (ref 13.0–17.0)
Immature Granulocytes: 1 %
Lymphocytes Relative: 22 %
Lymphs Abs: 1.4 10*3/uL (ref 0.7–4.0)
MCH: 31.1 pg (ref 26.0–34.0)
MCHC: 30.4 g/dL (ref 30.0–36.0)
MCV: 102.2 fL — ABNORMAL HIGH (ref 80.0–100.0)
Monocytes Absolute: 1.1 10*3/uL — ABNORMAL HIGH (ref 0.1–1.0)
Monocytes Relative: 17 %
Neutro Abs: 3.4 10*3/uL (ref 1.7–7.7)
Neutrophils Relative %: 53 %
Platelets: 156 10*3/uL (ref 150–400)
RBC: 3.25 MIL/uL — ABNORMAL LOW (ref 4.22–5.81)
RDW: 24.2 % — ABNORMAL HIGH (ref 11.5–15.5)
WBC: 6.3 10*3/uL (ref 4.0–10.5)
nRBC: 0 % (ref 0.0–0.2)

## 2019-02-03 MED ORDER — FUROSEMIDE 10 MG/ML IJ SOLN
40.0000 mg | Freq: Once | INTRAMUSCULAR | Status: AC
  Administered 2019-02-03: 40 mg via INTRAVENOUS
  Filled 2019-02-03: qty 4

## 2019-02-03 MED ORDER — FUROSEMIDE 10 MG/ML IJ SOLN
20.0000 mg | Freq: Two times a day (BID) | INTRAMUSCULAR | Status: DC
  Administered 2019-02-03 – 2019-02-08 (×10): 20 mg via INTRAVENOUS
  Filled 2019-02-03 (×10): qty 2

## 2019-02-03 NOTE — Progress Notes (Signed)
PROGRESS NOTE  Gregory Hansen YBO:[id number] DOB: 09/18/1950   PCP: [name], DO  Patient is from: Home.  Independently ambulates at baseline.  Lives with his son.  DOA: 01/26/2019 LOS: 8  Brief Narrative / Interim history: 68 year old male with history of CAD/MI s/p CABG x4 in 2015, ankylosing spondylitis, HTN, DM-2, alcohol use disorder, depression, GERD and peripheral neuropathy brought to ED by EMS after found down at home by [employer].  Reportedly felt weak and lightheaded and fell walking up the steps at home and could not get up for about 30 minutes until the [employer] found him down.  Denies hitting his head or loss of consciousness.  Reportedly had lower extremity weakness, muscle pains and fatigue for sometimes.  Nausea but no emesis.  Endorses 3-4 shots of bourbon per day.  No history of DT or alcohol withdrawal seizure.  Denies smoking cigarettes or drug use.  Denies Tylenol.  In ED, afebrile and HDS.  Hgb 10.6.  Platelets 61.  Sodium 128.  CO2 17.  K3.4.  Mg 1.5.  Cr 2.92.  BUN 39.  AST 1632.  ALT 374.  ALP 246.  CK 33,000.  LDH 1200.  High-sensitivity troponin 26.  EKG NSR with nonspecific IVCD but no acute ischemic finding.  CT lumbar spine revealed acute burst fracture of L1 without body height loss of spinal cord involvement.  Neurosurgery consulted and recommended TLSO and outpatient follow-up in 2 weeks.  Started on aggressive IV fluids hydration and electrolyte replacement and admitted for fall, rhabdo, AKI, elevated liver enzymes, hyponatremia and lumbar fracture.  Assessment & Plan: Fall at home/L1 fracture/Traumatic rhabdomyolysis: felt weak, lightheaded and fell.  No head impaction or LOC. -Alcohol could have contributed to his fall in addition to his underlying lower extremity weakness and muscle pain -CK 33,000 (admit)>>> 5000> 3120. -L1 burst fracture w/o body height loss or spinal cord involvement-TLSO and outpatient follow-up in 2 wks per NS   -Stop IV fluids. -History suspicious for statin intolerance-he is on very high-dose-could benefit from PCSK9 inhibitors -As needed pain meds with good bowel regimen for pain control  AKI with azotemia: Cr 1.0 (baseline)> 2.92 (admit)> 2.85>1.29> 1.09> 0.87-suspect ATN due to rhabdo.  Good UOP.   Non-anion gap metabolic acidosis: Resolved.  Elevated liver enzymes/elevated ALP/hyperbilirubinemia/hepatic steatosis/alcoholic hepatitis/shock liver: Likely either due to rhabdo vs EtOH and statin or shock liver due to being on the floor.  RUQ [place] with hepatic steatosis.  Improving. -Counseled about alcohol-confident about cessation -Would avoid statin in near future.  Consider resuming back after some time. -Continue monitoring.    Alcohol use disorder: Reports drinking at least 3-4 bourbons a day.  -Encourage cessation-determined to quit -No signs of withdrawal.  Continue CIWA protocol with Ativan -Multivitamins, thiamine and folic acid  Hyponatremia: beer potomania, AKI and possibly SSRI.  Resolved  Hypokalemia/hypomagnesemia Magnesium 1.5 again today.  Will replace through IV.  Hypokalemia resolved.  Elevated PT/INR/coagulopathy: Likely due to acute hepatitis.  Improved -Continue monitoring  Anemia of chronic disease: Hgb 11-12 (baseline)> 10.6 admit> 10.1>9.5> 9.8. No melena or hematochezia.  Stable. -Continue monitoring  Elevated troponin/BNP/history of CAD/CABG x4 in 2015: lightheadedness and weakness likely from alcohol.  No cardiopulmonary symptoms now.  Mild troponin and BNP elevation likely delayed clearance.  EKG NSR with nonspecific IVCD but no ischemic finding -Continue monitoring -Continue home cardiac meds except statin-suspect intolerance -Outpatient cardiology follow-up for PCSK9 inhibitors  Controlled DM-2: A1c 5.1%.  CBG within fair range.  Has not needed  SSI.  -Discontinued CBG monitoring and SSI -BMP glucose within normal  Essential hypertension: Blood pressure  controlled.  Will resume losartan now that his AKI has resolved.  Continue home dose of metoprolol and amlodipine 5 mg that was started here.  History of ankylosing spondylitis: Not on medications. -Outpatient follow-up  Depression: Stable -Continue home Wellbutrin and SSRI.  Bilateral lower extremity weakness/muscle pain: suspect this to be due to statin.  His alcohol could contribute.  CRP, ESR and aldolase elevated.  Seen by PT OT and they recommend inpatient rehab.  Insurance authorization and process.  Bilateral lower extremity edema: Give him a dose of Lasix 40 mg in the morning.  Will initiate on 20 mg IV Lasix twice daily.   DVT prophylaxis: Subcu heparin Code Status: Full code Family Communication: No family member present.  Plan of care discussed with patient who verbalized understanding. Disposition Plan: Discharge to CIR once insurance approval obtained which is a still pending. Consultants: None  Subjective: Seen and examined.  He has no complaints.  Objective: Vitals:   02/02/19 1407 02/02/19 2243 02/02/19 2245 02/03/19 0628  BP: 137/73 (!) 154/71 (!) 143/75 139/75  Pulse: 77 80 74 77  Resp: '20 20  20  ' Temp: 98.7 F (37.1 C) 98.5 F (36.9 C)  98.2 F (36.8 C)  TempSrc: Oral Oral  Oral  SpO2: 98% 99%  98%  Weight:    131.8 kg  Height:        Intake/Output Summary (Last 24 hours) at 02/03/2019 1042 Last data filed at 02/03/2019 1001 Gross per 24 hour  Intake 1080 ml  Output 2900 ml  Net -1820 ml   Filed Weights   01/27/19 0500 02/02/19 0607 02/03/19 0628  Weight: 118.9 kg 131.2 kg 131.8 kg    Examination:  General exam: Appears calm and comfortable  Respiratory system: Clear to auscultation. Respiratory effort normal. Cardiovascular system: S1 & S2 heard, RRR. No JVD, murmurs, rubs, gallops or clicks.  +2 pitting edema bilateral lower extremity Gastrointestinal system: Abdomen is nondistended, soft and nontender. No organomegaly or masses felt. Normal  bowel sounds heard. Central nervous system: Alert and oriented. No focal neurological deficits. Extremities: Symmetric 5 x 5 power. Skin: No rashes, lesions or ulcers.  Psychiatry: Judgement and insight appear normal. Mood & affect appropriate.    Procedures:  None  Microbiology summarized: ASNKN-39 negative. MRSA PCR negative.  Sch Meds:  Scheduled Meds: . amLODipine  5 mg Oral Daily  . buPROPion  150 mg Oral QODAY  . chlorhexidine  15 mL Mouth Rinse BID  . docusate sodium  100 mg Oral BID  . fluticasone  1 spray Each Nare Daily  . folic acid  1 mg Oral Daily  . heparin injection (subcutaneous)  5,000 Units Subcutaneous Q8H  . losartan  50 mg Oral Daily  . mouth rinse  15 mL Mouth Rinse q12n4p  . metoprolol succinate  50 mg Oral Daily  . multivitamin with minerals  1 tablet Oral Daily  . pantoprazole  40 mg Oral Daily  . polyethylene glycol  17 g Oral Daily  . senna  1 tablet Oral BID  . thiamine  100 mg Oral Daily  . vitamin B-12  1,000 mcg Oral Daily   Continuous Infusions:  PRN Meds:.ondansetron **OR** ondansetron (ZOFRAN) IV, oxyCODONE  Antimicrobials: Anti-infectives (From admission, onward)   None       I have personally reviewed the following labs and images: CBC: Recent Labs  Lab 01/29/19 0546 01/30/19 0602  02/01/19 0512 02/02/19 0554 02/03/19 0534  WBC 7.5 7.1 6.6 5.7 6.3  NEUTROABS  --   --  3.8 3.0 3.4  HGB 10.1* 9.5* 9.8* 10.0* 10.1*  HCT 32.6* 29.6* 31.7* 32.7* 33.2*  MCV 97.0 96.1 99.4 101.2* 102.2*  PLT 89* 98* 124* 140* 156   BMP &GFR Recent Labs  Lab 01/28/19 0550 01/29/19 0546 01/30/19 0602 01/31/19 0601 02/01/19 0512 02/02/19 0554 02/03/19 0534  NA 131* 135 136 136 138 135 136  K 3.6 3.6 3.7 3.9 3.7 4.0 3.6  CL 106 110 114* 113* 115* 112* 109  CO2 16* 17* 17* 17* 18* 18* 21*  GLUCOSE 101* 107* 111* 118* 123* 101* 100*  BUN 38* 32* 26* 25* '20 19 19  ' CREATININE 2.41* 1.83* 1.29* 1.09 0.87 0.84 0.87  CALCIUM 6.9* 7.1* 7.4*  7.6* 7.6* 7.8* 8.1*  MG 1.3* 1.6* 1.4* 1.5* 1.1* 1.5*  --   PHOS 2.7  --   --  2.3*  --   --   --    Estimated Creatinine Clearance: 115.6 mL/min (by C-G formula based on SCr of 0.87 mg/dL). Liver & Pancreas: Recent Labs  Lab 01/30/19 0602 01/31/19 0601 02/01/19 0512 02/02/19 0554 02/03/19 0534  AST 619* 446* 297* 219* 195*  ALT 254* 226* 189* 164* 161*  ALKPHOS 210* 201* 181* 154* 178*  BILITOT 3.1* 3.0* 2.4* 2.5* 2.1*  PROT 5.4* 5.7* 5.5* 5.2* 5.8*  ALBUMIN 1.8* 2.0* 1.8* 1.9* 2.0*   No results for input(s): LIPASE, AMYLASE in the last 168 hours. No results for input(s): AMMONIA in the last 168 hours. Diabetic: No results for input(s): HGBA1C in the last 72 hours. Recent Labs  Lab 01/30/19 0736 01/30/19 1201 01/30/19 1700 01/31/19 0731 01/31/19 1650  GLUCAP 104* 110* 86 93 90   Cardiac Enzymes: Recent Labs  Lab 01/27/19 1549 01/28/19 0550 01/29/19 0546 01/30/19 0602 01/31/19 0601  CKTOTAL 20,073* 12,039* 7,304* 4,894* 3,120*   No results for input(s): PROBNP in the last 8760 hours. Coagulation Profile: Recent Labs  Lab 01/28/19 0550 01/30/19 0602 01/31/19 0601  INR 1.4* 1.3* 1.2   Thyroid Function Tests: No results for input(s): TSH, T4TOTAL, FREET4, T3FREE, THYROIDAB in the last 72 hours. Lipid Profile: No results for input(s): CHOL, HDL, LDLCALC, TRIG, CHOLHDL, LDLDIRECT in the last 72 hours. Anemia Panel: No results for input(s): VITAMINB12, FOLATE, FERRITIN, TIBC, IRON, RETICCTPCT in the last 72 hours. Urine analysis:    Component Value Date/Time   COLORURINE YELLOW 01/26/2019 2242   APPEARANCEUR CLEAR 01/26/2019 2242   LABSPEC 1.008 01/26/2019 2242   PHURINE 6.0 01/26/2019 2242   GLUCOSEU NEGATIVE 01/26/2019 2242   HGBUR LARGE (A) 01/26/2019 2242   [place] NEGATIVE 01/26/2019 [place] 01/26/2019 2242   PROTEINUR 100 (A) 01/26/2019 2242   UROBILINOGEN 1.0 10/28/2013 1857   NITRITE NEGATIVE 01/26/2019 2242    LEUKOCYTESUR NEGATIVE 01/26/2019 2242   Sepsis Labs: Invalid input(s): PROCALCITONIN, [place]  Microbiology: Recent Results (from the past 240 hour(s))  SARS CORONAVIRUS 2 (TAT 6-24 HRS) Nasopharyngeal Nasopharyngeal Swab     Status: None   Collection Time: 01/26/19  7:16 PM   Specimen: Nasopharyngeal Swab  Result Value Ref Range Status   SARS Coronavirus 2 NEGATIVE NEGATIVE Final    Comment: (NOTE) SARS-CoV-2 target nucleic acids are NOT DETECTED. The SARS-CoV-2 RNA is generally detectable in upper and lower respiratory specimens during the acute phase of infection. Negative results do not preclude SARS-CoV-2 infection, do not rule out co-infections with  other pathogens, and should not be used as the sole basis for treatment or other patient management decisions. Negative results must be combined with clinical observations, patient history, and epidemiological information. The expected result is Negative. Fact Sheet for Patients: SugarRoll.be Fact Sheet for Healthcare Providers: https://www.woods-mathews.com/ This test is not yet approved or cleared by the [place] FDA and  has been authorized for detection and/or diagnosis of SARS-CoV-2 by FDA under an Emergency Use Authorization (EUA). This EUA will remain  in effect (meaning this test can be used) for the duration of the COVID-19 declaration under Section 56 4(b)(1) of the Act, 21 U.S.C. section 360bbb-3(b)(1), unless the authorization is terminated or revoked sooner. Performed at [facility], [place] [address], [place], [place] [address]   MRSA PCR Screening     Status: None   Collection Time: 01/27/19 10:00 AM   Specimen: Nasal Mucosa; Nasopharyngeal  Result Value Ref Range Status   MRSA by PCR NEGATIVE NEGATIVE Final    Comment:        The GeneXpert MRSA Assay (FDA approved for NASAL specimens only), is one component of a comprehensive MRSA  colonization surveillance program. It is not intended to diagnose MRSA infection nor to guide or monitor treatment for MRSA infections. Performed at [facility] [place] [address], [place], [place] [address]     Radiology Studies: No results found.  Darliss Cheney, MD [facility]  If 7PM-7AM, please contact night-coverage [email or web address] Password [facility] 02/03/2019, 10:42 AM

## 2019-02-03 NOTE — TOC Progression Note (Signed)
Transition of Care [facility]) - Progression Note    Patient Details  Name: Gregory Hansen MRN: [id number] Date of Birth: [birth year]  [facility]) CM/SW Contact  Shaely Gadberry, Marjie Skiff, RN Phone Number:[phone] 02/03/2019, 2:06 PM  Clinical Narrative:    This CM spoke with pt about dc plans. Pt wants to go to CIR but we discussed back up plan if insurance does not auth. Pt states he can go to SNF as backup. This CM faxed out FL2 to area facilities.

## 2019-02-03 NOTE — Progress Notes (Signed)
Inpatient Rehab Admissions Coordinator:   Called pt's insurance for update on prior auth request.  Case has not yet been reviewed by the medical director.  Given pt's need for transport to CIR from [facility], will need to look for possible admission on Friday if insurance approves.  My coworker, Raechel Ache, will be following on Friday for possible admission.   Shann Medal, PT, DPT Admissions Coordinator [phone] 02/03/19  2:01 PM

## 2019-02-03 NOTE — Care Management Important Message (Signed)
Important Message  Patient Details IM Letter given to Marney Doctor RN to present to the Patient Name: Gregory Hansen MRN: [id number] Date of Birth: Feb 26, 1951   Medicare Important Message Given:  Yes     [name] 02/03/2019, 9:53 AM

## 2019-02-03 NOTE — Progress Notes (Signed)
Inpatient Rehab Admissions Coordinator:   Notified by [employer] company that they have not made a determination yet.  Dr. Doristine Bosworth to call to provide information as requested.  Will continue to follow.  We will not be admitting patients tomorrow, Thursday 11/26.  My coworker, Raechel Ache will follow up on Friday 11/27.    Shann Medal, PT, DPT Admissions Coordinator [phone] 02/03/19  4:48 PM

## 2019-02-03 NOTE — NC FL2 (Addendum)
[place] LEVEL OF CARE SCREENING TOOL     IDENTIFICATION  Patient Name: Gregory Hansen Birthdate: 12-14-1950 Sex: male Admission Date (Current Location): 01/26/2019  [facility] and [place] Number:  Herbalist and Address:  [facility],  [place] [address], [place]      Provider Number: [id number]  Attending Physician Name and Address:  Darliss Cheney, MD  Relative Name and Phone Number:  Rohin Ketterer [phone]    Current Level of Care: SNF Recommended Level of Care: [place] Prior Approval Number:    Date Approved/Denied:   PASRR Number: [id number]  Discharge Plan: SNF    Current Diagnoses: Patient Active Problem List   Diagnosis Date Noted  . Rhabdomyolysis 02/02/2019  . Fall at home, initial encounter 02/02/2019  . Bilateral lower extremity edema 02/02/2019  . Acute hepatitis 01/26/2019  . Leg cramping 12/22/2018  . Leg swelling 11/20/2018  . Rash and nonspecific skin eruption 05/23/2018  . Cellulitis 03/30/2018  . Acute kidney failure ([place]) 03/30/2018  . Peripheral neuropathy 11/04/2017  . Gynecomastia, male 02/18/2017  . Healthcare maintenance 01/20/2017  . Severe sepsis with acute organ dysfunction ([place]) 12/03/2016  . Aortic atherosclerosis ([place]) 12/03/2016  . Diarrhea due to cryptosporidium ([place])   . AKI (acute kidney injury) ([place])   . S/P CABG x 4 10/29/2013  . Hyperlipidemia with target LDL less than 70 06/15/2008  . DEPRESSION 06/15/2008  . HYPERTENSION, BENIGN 06/15/2008  . CAD (coronary artery disease) 06/15/2008  . GERD 06/15/2008  . SPONDYLITIS, ANKYLOSING 06/15/2008  . Atherosclerotic heart disease of native coronary artery without angina pectoris 06/15/2008  . Gastro-esophageal reflux disease without esophagitis 06/15/2008    Orientation RESPIRATION BLADDER Height & Weight     Self, Time, Situation, Place  Normal Continent Weight: 131.8 kg Height:  6\' 2"  (188 cm)  BEHAVIORAL  SYMPTOMS/MOOD NEUROLOGICAL BOWEL NUTRITION STATUS      Continent Diet  AMBULATORY STATUS COMMUNICATION OF NEEDS Skin   Limited Assist Verbally Skin abrasions                       Personal Care Assistance Level of Assistance  Bathing, Dressing Bathing Assistance: Limited assistance   Dressing Assistance: Limited assistance     Functional Limitations Info  Sight Sight Info: Impaired        SPECIAL CARE FACTORS FREQUENCY  PT (By licensed PT), OT (By licensed OT)     PT Frequency: 5 x weekly OT Frequency: 5 x weekly            Contractures Contractures Info: Not present    Additional Factors Info  Code Status, Allergies Code Status Info: Full Allergies Info: None           Current Medications (02/03/2019):  This is the current hospital active medication list Current Facility-Administered Medications  Medication Dose Route Frequency Provider Last Rate Last Dose  . amLODipine (NORVASC) tablet 5 mg  5 mg Oral Daily Wendee Beavers T, MD   5 mg at 02/03/19 0949  . buPROPion (WELLBUTRIN XL) 24 hr tablet 150 mg  150 mg Oral Loni Dolly, MD   150 mg at 02/02/19 0944  . chlorhexidine (PERIDEX) 0.12 % solution 15 mL  15 mL Mouth Rinse BID Wendee Beavers T, MD   15 mL at 02/03/19 0950  . docusate sodium (COLACE) capsule 100 mg  100 mg Oral BID Bennie Pierini, MD   100 mg at 02/03/19  [id number]  . fluticasone (FLONASE) 50 MCG/ACT nasal spray 1 spray  1 spray Each Nare Daily [place], [place]   1 spray at 02/03/19 [id number]  . folic acid (FOLVITE) tablet 1 mg  1 mg Oral Daily Bennie Pierini, MD   1 mg at 02/03/19 0949  . heparin injection 5,000 Units  5,000 Units Subcutaneous Q8H Bennie Pierini, MD   5,000 Units at 02/03/19 [phone]  . losartan (COZAAR) tablet 50 mg  50 mg Oral Daily Darliss Cheney, MD   50 mg at 02/03/19 0949  . MEDLINE mouth rinse  15 mL Mouth Rinse q12n4p Wendee Beavers T, MD   15 mL at 02/03/19 1205  . metoprolol succinate (TOPROL-XL) 24 hr tablet 50  mg  50 mg Oral Daily Bennie Pierini, MD   50 mg at 02/03/19 0949  . multivitamin with minerals tablet 1 tablet  1 tablet Oral Daily Bennie Pierini, MD   1 tablet at 02/03/19 0950  . ondansetron (ZOFRAN) tablet 4 mg  4 mg Oral Q6H PRN Bennie Pierini, MD       Or  . ondansetron [facility]) injection 4 mg  4 mg Intravenous Q6H PRN Bennie Pierini, MD      . oxyCODONE (Oxy IR/ROXICODONE) immediate release tablet 5-10 mg  5-10 mg Oral Q4H PRN Bennie Pierini, MD   10 mg at 02/03/19 0740  . pantoprazole (PROTONIX) EC tablet 40 mg  40 mg Oral Daily Bennie Pierini, MD   40 mg at 02/03/19 0949  . polyethylene glycol (MIRALAX / GLYCOLAX) packet 17 g  17 g Oral Daily Bennie Pierini, MD   17 g at 02/03/19 0948  . senna (SENOKOT) tablet 8.6 mg  1 tablet Oral BID Bennie Pierini, MD   8.6 mg at 02/03/19 0949  . thiamine (VITAMIN B-1) tablet 100 mg  100 mg Oral Daily Bennie Pierini, MD   100 mg at 02/03/19 0949  . vitamin B-12 (CYANOCOBALAMIN) tablet 1,000 mcg  1,000 mcg Oral Daily Bennie Pierini, MD   1,000 mcg at 02/03/19 [id number]     Discharge Medications: Please see discharge summary for a list of discharge medications.  Relevant Imaging Results:  Relevant Lab Results:   Additional Information ss# [id number]  Jamarri Vuncannon, Marjie Skiff, RN

## 2019-02-04 LAB — CBC WITH DIFFERENTIAL/PLATELET
Abs Immature Granulocytes: 0.03 10*3/uL (ref 0.00–0.07)
Basophils Absolute: 0.1 10*3/uL (ref 0.0–0.1)
Basophils Relative: 1 %
Eosinophils Absolute: 0.3 10*3/uL (ref 0.0–0.5)
Eosinophils Relative: 6 %
HCT: 31 % — ABNORMAL LOW (ref 39.0–52.0)
Hemoglobin: 9.7 g/dL — ABNORMAL LOW (ref 13.0–17.0)
Immature Granulocytes: 1 %
Lymphocytes Relative: 26 %
Lymphs Abs: 1.3 10*3/uL (ref 0.7–4.0)
MCH: 30.8 pg (ref 26.0–34.0)
MCHC: 31.3 g/dL (ref 30.0–36.0)
MCV: 98.4 fL (ref 80.0–100.0)
Monocytes Absolute: 0.9 10*3/uL (ref 0.1–1.0)
Monocytes Relative: 16 %
Neutro Abs: 2.6 10*3/uL (ref 1.7–7.7)
Neutrophils Relative %: 50 %
Platelets: 147 10*3/uL — ABNORMAL LOW (ref 150–400)
RBC: 3.15 MIL/uL — ABNORMAL LOW (ref 4.22–5.81)
RDW: 23.9 % — ABNORMAL HIGH (ref 11.5–15.5)
WBC: 5.2 10*3/uL (ref 4.0–10.5)
nRBC: 0 % (ref 0.0–0.2)

## 2019-02-04 NOTE — Progress Notes (Signed)
Inpatient Rehab Admissions Coordinator:   Note that request for CIR prior authorization was denied by insurance company.  Will sign off at this time.    Shann Medal, PT, DPT Admissions Coordinator [phone] 02/04/19  4:48 PM

## 2019-02-04 NOTE — Progress Notes (Signed)
PROGRESS NOTE  Gregory Hansen LMB:[id number] DOB: 02-27-1951   PCP: [name], DO  Patient is from: Home.  Independently ambulates at baseline.  Lives with his son.  DOA: 01/26/2019 LOS: 9  Brief Narrative / Interim history: 68 year old male with history of CAD/MI s/p CABG x4 in 2015, ankylosing spondylitis, HTN, DM-2, alcohol use disorder, depression, GERD and peripheral neuropathy brought to ED by EMS after found down at home by [employer].  Reportedly felt weak and lightheaded and fell walking up the steps at home and could not get up for about 30 minutes until the [employer] found him down.  Denies hitting his head or loss of consciousness.  Reportedly had lower extremity weakness, muscle pains and fatigue for sometimes.  Nausea but no emesis.  Endorses 3-4 shots of bourbon per day.  No history of DT or alcohol withdrawal seizure.  Denies smoking cigarettes or drug use.  Denies Tylenol.  In ED, afebrile and HDS.  Hgb 10.6.  Platelets 61.  Sodium 128.  CO2 17.  K3.4.  Mg 1.5.  Cr 2.92.  BUN 39.  AST 1632.  ALT 374.  ALP 246.  CK 33,000.  LDH 1200.  High-sensitivity troponin 26.  EKG NSR with nonspecific IVCD but no acute ischemic finding.  CT lumbar spine revealed acute burst fracture of L1 without body height loss of spinal cord involvement.  Neurosurgery consulted and recommended TLSO and outpatient follow-up in 2 weeks.  Started on aggressive IV fluids hydration and electrolyte replacement and admitted for fall, rhabdo, AKI, elevated liver enzymes, hyponatremia and lumbar fracture.  Assessment & Plan: Fall at home/L1 fracture/Traumatic rhabdomyolysis: felt weak, lightheaded and fell.  No head impaction or LOC. -Alcohol could have contributed to his fall in addition to his underlying lower extremity weakness and muscle pain -CK 33,000 (admit)>>> 5000> 3120.  Will repeat tomorrow. -L1 burst fracture w/o body height loss or spinal cord involvement-TLSO and outpatient  follow-up in 2 wks per NS  -History suspicious for statin intolerance-he is on very high-dose-could benefit from PCSK9 inhibitors -As needed pain meds with good bowel regimen for pain control  AKI with azotemia: Cr 1.0 (baseline)> 2.92 (admit)> 2.85>1.29> 1.09> 0.87-suspect ATN due to rhabdo.  Good UOP.   Non-anion gap metabolic acidosis: Resolved.  Elevated liver enzymes/elevated ALP/hyperbilirubinemia/hepatic steatosis/alcoholic hepatitis/shock liver: Likely either due to rhabdo vs EtOH and statin or shock liver due to being on the floor.  RUQ [place] with hepatic steatosis.  Improving. -Counseled about alcohol-confident about cessation -Would avoid statin in near future.  Consider resuming back after some time. -Continue monitoring.    Alcohol use disorder: Reports drinking at least 3-4 bourbons a day.  -Encourage cessation-determined to quit -No signs of withdrawal.  Continue CIWA protocol with Ativan -Multivitamins, thiamine and folic acid  Hyponatremia: beer potomania, AKI and possibly SSRI.  Resolved  Hypokalemia/hypomagnesemia Resolved.  Elevated PT/INR/coagulopathy: Likely due to acute hepatitis.  Improved -Continue monitoring  Anemia of chronic disease: Hgb 11-12 (baseline)> 10.6 admit> 10.1>9.5> 9.8. No melena or hematochezia.  Stable. -Continue monitoring  Elevated troponin/BNP/history of CAD/CABG x4 in 2015: lightheadedness and weakness likely from alcohol.  No cardiopulmonary symptoms now.  Mild troponin and BNP elevation likely delayed clearance.  EKG NSR with nonspecific IVCD but no ischemic finding -Continue monitoring -Continue home cardiac meds except statin-suspect intolerance -Outpatient cardiology follow-up for PCSK9 inhibitors  Controlled DM-2: A1c 5.1%.  CBG within fair range.  Has not needed SSI.  -Discontinued CBG monitoring and SSI -BMP glucose within  normal  Essential hypertension: Blood pressure controlled.  Will resume losartan now that his AKI has  resolved.  Continue home dose of metoprolol and amlodipine 5 mg that was started here.  History of ankylosing spondylitis: Not on medications. -Outpatient follow-up  Depression: Stable -Continue home Wellbutrin and SSRI.  Bilateral lower extremity weakness/muscle pain: suspect this to be due to statin.  His alcohol could contribute.  CRP, ESR and aldolase elevated.  Seen by PT OT and they recommend inpatient rehab.  Did peer to peer with insurance company last night after I received a message from [place].  Unfortunately, he was denied CIR however SNF was approved.  Discussed with RN to convey this to social worker to start finding placement for him.  Bilateral lower extremity edema: Continue Lasix 20 mg IV twice daily.   DVT prophylaxis: Subcu heparin Code Status: Full code Family Communication: No family member present.  Plan of care discussed with patient who verbalized understanding. Disposition Plan: Discharge to CIR once insurance approval obtained which is a still pending. Consultants: None  Subjective: Seen and examined.  He has no complaints.  Objective: Vitals:   02/02/19 2245 02/03/19 0628 02/03/19 1423 02/04/19 0657  BP: (!) 143/75 139/75 133/66 132/77  Pulse: 74 77 75 75  Resp:  _0 Temp:  98.2 F (36.8 C) 98.2 F (36.8 C) 97.7 F (36.5 C)  TempSrc:  Oral Oral Oral  SpO2:  98% 98% 97%  Weight:  131.8 kg    Height:        Intake/Output Summary (Last 24 hours) at 02/04/2019 1007 Last data filed at 02/04/2019 5093 Gross per 24 hour  Intake 720 ml  Output 2450 ml  Net -1730 ml   Filed Weights   01/27/19 0500 02/02/19 0607 02/03/19 0628  Weight: 118.9 kg 131.2 kg 131.8 kg    Examination:  General exam: Appears calm and comfortable  Respiratory system: Clear to auscultation. Respiratory effort normal. Cardiovascular system: S1 & S2 heard, RRR. No JVD, murmurs, rubs, gallops or clicks.  +2 pitting edema bilateral lower  extremity Gastrointestinal system: Abdomen is nondistended, soft and nontender. No organomegaly or masses felt. Normal bowel sounds heard. Central nervous system: Alert and oriented. No focal neurological deficits. Extremities: Symmetric 5 x 5 power. Skin: No rashes, lesions or ulcers.  Psychiatry: Judgement and insight appear normal. Mood & affect appropriate.    Procedures:  None  Microbiology summarized: OIZTI-45 negative. MRSA PCR negative.  Sch Meds:  Scheduled Meds: . amLODipine  5 mg Oral Daily  . buPROPion  150 mg Oral QODAY  . chlorhexidine  15 mL Mouth Rinse BID  . docusate sodium  100 mg Oral BID  . fluticasone  1 spray Each Nare Daily  . folic acid  1 mg Oral Daily  . furosemide  20 mg Intravenous BID  . heparin injection (subcutaneous)  5,000 Units Subcutaneous Q8H  . losartan  50 mg Oral Daily  . mouth rinse  15 mL Mouth Rinse q12n4p  . metoprolol succinate  50 mg Oral Daily  . multivitamin with minerals  1 tablet Oral Daily  . pantoprazole  40 mg Oral Daily  . polyethylene glycol  17 g Oral Daily  . senna  1 tablet Oral BID  . thiamine  100 mg Oral Daily  . vitamin B-12  1,000 mcg Oral Daily   Continuous Infusions:  PRN Meds:.ondansetron **OR** ondansetron (ZOFRAN) IV, oxyCODONE  Antimicrobials: Anti-infectives (From admission, onward)   None  I have personally reviewed the following labs and images: CBC: Recent Labs  Lab 01/30/19 0602 02/01/19 0512 02/02/19 0554 02/03/19 0534 02/04/19 0709  WBC 7.1 6.6 5.7 6.3 5.2  NEUTROABS  --  3.8 3.0 3.4 2.6  HGB 9.5* 9.8* 10.0* 10.1* 9.7*  HCT 29.6* 31.7* 32.7* 33.2* 31.0*  MCV 96.1 99.4 101.2* 102.2* 98.4  PLT 98* 124* 140* 156 147*   BMP &GFR Recent Labs  Lab 01/29/19 0546 01/30/19 0602 01/31/19 0601 02/01/19 0512 02/02/19 0554 02/03/19 0534  NA 135 136 136 138 135 136  K 3.6 3.7 3.9 3.7 4.0 3.6  CL 110 114* 113* 115* 112* 109  CO2 17* 17* 17* 18* 18* 21*  GLUCOSE 107* 111* 118*  123* 101* 100*  BUN 32* 26* 25* _0 CREATININE 1.83* 1.29* 1.09 0.87 0.84 0.87  CALCIUM 7.1* 7.4* 7.6* 7.6* 7.8* 8.1*  MG 1.6* 1.4* 1.5* 1.1* 1.5*  --   PHOS  --   --  2.3*  --   --   --    Estimated Creatinine Clearance: 115.6 mL/min (by C-G formula based on SCr of 0.87 mg/dL). Liver & Pancreas: Recent Labs  Lab 01/30/19 0602 01/31/19 0601 02/01/19 0512 02/02/19 0554 02/03/19 0534  AST 619* 446* 297* 219* 195*  ALT 254* 226* 189* 164* 161*  ALKPHOS 210* 201* 181* 154* 178*  BILITOT 3.1* 3.0* 2.4* 2.5* 2.1*  PROT 5.4* 5.7* 5.5* 5.2* 5.8*  ALBUMIN 1.8* 2.0* 1.8* 1.9* 2.0*   No results for input(s): LIPASE, AMYLASE in the last 168 hours. No results for input(s): AMMONIA in the last 168 hours. Diabetic: No results for input(s): HGBA1C in the last 72 hours. Recent Labs  Lab 01/30/19 0736 01/30/19 1201 01/30/19 1700 01/31/19 0731 01/31/19 1650  GLUCAP 104* 110* 86 93 90   Cardiac Enzymes: Recent Labs  Lab 01/29/19 0546 01/30/19 0602 01/31/19 0601  CKTOTAL 7,304* 4,894* 3,120*   No results for input(s): PROBNP in the last 8760 hours. Coagulation Profile: Recent Labs  Lab 01/30/19 0602 01/31/19 0601  INR 1.3* 1.2   Thyroid Function Tests: No results for input(s): TSH, T4TOTAL, FREET4, T3FREE, THYROIDAB in the last 72 hours. Lipid Profile: No results for input(s): CHOL, HDL, LDLCALC, TRIG, CHOLHDL, LDLDIRECT in the last 72 hours. Anemia Panel: No results for input(s): VITAMINB12, FOLATE, FERRITIN, TIBC, IRON, RETICCTPCT in the last 72 hours. Urine analysis:    Component Value Date/Time   COLORURINE YELLOW 01/26/2019 2242   APPEARANCEUR CLEAR 01/26/2019 2242   LABSPEC 1.008 01/26/2019 2242   PHURINE 6.0 01/26/2019 2242   GLUCOSEU NEGATIVE 01/26/2019 2242   HGBUR LARGE (A) 01/26/2019 2242   [place] NEGATIVE 01/26/2019 [place] 01/26/2019 2242   PROTEINUR 100 (A) 01/26/2019 2242   UROBILINOGEN 1.0 10/28/2013 1857   NITRITE  NEGATIVE 01/26/2019 2242   LEUKOCYTESUR NEGATIVE 01/26/2019 2242   Sepsis Labs: Invalid input(s): PROCALCITONIN, [place]  Microbiology: Recent Results (from the past 240 hour(s))  SARS CORONAVIRUS 2 (TAT 6-24 HRS) Nasopharyngeal Nasopharyngeal Swab     Status: None   Collection Time: 01/26/19  7:16 PM   Specimen: Nasopharyngeal Swab  Result Value Ref Range Status   SARS Coronavirus 2 NEGATIVE NEGATIVE Final    Comment: (NOTE) SARS-CoV-2 target nucleic acids are NOT DETECTED. The SARS-CoV-2 RNA is generally detectable in upper and lower respiratory specimens during the acute phase of infection. Negative results do not preclude SARS-CoV-2 infection, do not rule out co-infections with other pathogens, and should not  be used as the sole basis for treatment or other patient management decisions. Negative results must be combined with clinical observations, patient history, and epidemiological information. The expected result is Negative. Fact Sheet for Patients: SugarRoll.be Fact Sheet for Healthcare Providers: https://www.woods-mathews.com/ This test is not yet approved or cleared by the [place] FDA and  has been authorized for detection and/or diagnosis of SARS-CoV-2 by FDA under an Emergency Use Authorization (EUA). This EUA will remain  in effect (meaning this test can be used) for the duration of the COVID-19 declaration under Section 56 4(b)(1) of the Act, 21 U.S.C. section 360bbb-3(b)(1), unless the authorization is terminated or revoked sooner. Performed at [facility], [place] [address], [place], [place] [address]   MRSA PCR Screening     Status: None   Collection Time: 01/27/19 10:00 AM   Specimen: Nasal Mucosa; Nasopharyngeal  Result Value Ref Range Status   MRSA by PCR NEGATIVE NEGATIVE Final    Comment:        The GeneXpert MRSA Assay (FDA approved for NASAL specimens only), is one component of  a comprehensive MRSA colonization surveillance program. It is not intended to diagnose MRSA infection nor to guide or monitor treatment for MRSA infections. Performed at [facility], [place] [address] [place], [place] [address]     Radiology Studies: No results found.  Darliss Cheney, MD [facility]  If 7PM-7AM, please contact night-coverage [email or web address] Password [email or web address] 02/04/2019, 10:07 AM

## 2019-02-05 LAB — CBC WITH DIFFERENTIAL/PLATELET
Abs Immature Granulocytes: 0.04 10*3/uL (ref 0.00–0.07)
Basophils Absolute: 0.1 10*3/uL (ref 0.0–0.1)
Basophils Relative: 1 %
Eosinophils Absolute: 0.4 10*3/uL (ref 0.0–0.5)
Eosinophils Relative: 6 %
HCT: 31.7 % — ABNORMAL LOW (ref 39.0–52.0)
Hemoglobin: 9.9 g/dL — ABNORMAL LOW (ref 13.0–17.0)
Immature Granulocytes: 1 %
Lymphocytes Relative: 25 %
Lymphs Abs: 1.5 10*3/uL (ref 0.7–4.0)
MCH: 31.1 pg (ref 26.0–34.0)
MCHC: 31.2 g/dL (ref 30.0–36.0)
MCV: 99.7 fL (ref 80.0–100.0)
Monocytes Absolute: 0.9 10*3/uL (ref 0.1–1.0)
Monocytes Relative: 14 %
Neutro Abs: 3.2 10*3/uL (ref 1.7–7.7)
Neutrophils Relative %: 53 %
Platelets: 159 10*3/uL (ref 150–400)
RBC: 3.18 MIL/uL — ABNORMAL LOW (ref 4.22–5.81)
RDW: 23.5 % — ABNORMAL HIGH (ref 11.5–15.5)
WBC: 6.1 10*3/uL (ref 4.0–10.5)
nRBC: 0 % (ref 0.0–0.2)

## 2019-02-05 LAB — COMPREHENSIVE METABOLIC PANEL
ALT: 124 U/L — ABNORMAL HIGH (ref 0–44)
AST: 135 U/L — ABNORMAL HIGH (ref 15–41)
Albumin: 2.3 g/dL — ABNORMAL LOW (ref 3.5–5.0)
Alkaline Phosphatase: 166 U/L — ABNORMAL HIGH (ref 38–126)
Anion gap: 6 (ref 5–15)
BUN: 17 mg/dL (ref 8–23)
CO2: 25 mmol/L (ref 22–32)
Calcium: 8 mg/dL — ABNORMAL LOW (ref 8.9–10.3)
Chloride: 104 mmol/L (ref 98–111)
Creatinine, Ser: 0.82 mg/dL (ref 0.61–1.24)
GFR calc Af Amer: >60 mL/min (ref >60)
GFR calc non Af Amer: >60 mL/min (ref >60)
Glucose, Bld: 105 mg/dL — ABNORMAL HIGH (ref 70–99)
Potassium: 3.2 mmol/L — ABNORMAL LOW (ref 3.5–5.1)
Sodium: 135 mmol/L (ref 135–145)
Total Bilirubin: 1.9 mg/dL — ABNORMAL HIGH (ref 0.3–1.2)
Total Protein: 6 g/dL — ABNORMAL LOW (ref 6.5–8.1)

## 2019-02-05 LAB — CK: Total CK: 355 U/L (ref 49–397)

## 2019-02-05 LAB — MAGNESIUM: Magnesium: 1.1 mg/dL — ABNORMAL LOW (ref 1.7–2.4)

## 2019-02-05 MED ORDER — POTASSIUM CHLORIDE CRYS ER 20 MEQ PO TBCR
40.0000 meq | EXTENDED_RELEASE_TABLET | ORAL | Status: AC
  Administered 2019-02-05 (×2): 40 meq via ORAL
  Filled 2019-02-05 (×3): qty 2

## 2019-02-05 MED ORDER — MAGNESIUM SULFATE 2 GM/50ML IV SOLN
2.0000 g | Freq: Four times a day (QID) | INTRAVENOUS | Status: AC
  Administered 2019-02-05 (×2): 2 g via INTRAVENOUS
  Filled 2019-02-05 (×2): qty 50

## 2019-02-05 NOTE — TOC Progression Note (Signed)
Transition of Care [facility]) - Progression Note    Patient Details  Name: Gregory Hansen MRN: [id number] Date of Birth: 12-23-1950  Transition of Care [facility]) CM/SW [place], LCSW Phone Number: 02/05/2019, 12:46 PM  Clinical Narrative:   CSW met patient at bedside to discuss discharge plans. Patient was aware that insurance denied CIR and is agreeable to go to rehab. CSW gave patient  Medicare approved list of SNF in the area that had a bed available for patient. Patient made a decision and stated he would most likely want [facility] CSW will reach out to facility to see if they have a bed available. Patient is also waiting for a pasrr number from the state and CSW has faxed over information to [facility] but per there voicemail today is a recognized holiday and they are off.  CSW started authorization through [employer] medicare and is awaiting there approval. CSW will continue to follow for discharge needs           Expected Discharge Plan and Services                                                 Social Determinants of Health (SDOH) Interventions    Readmission Risk Interventions No flowsheet data found.

## 2019-02-05 NOTE — Progress Notes (Signed)
Physical Therapy Treatment Patient Details Name: Gregory Hansen MRN: [id number] DOB: 03-28-1950 Today's Date: 02/05/2019    History of Present Illness Gregory Hansen is a 68 y.o. male with medical history significant for CAD/MI status post CABG, ankylosing spondylitis, hypertension, diabetes mellitus type 2 and alcohol abuse who presented to the ED from home after a mechanical fall in his garage.  Patient reports that he was walking up the steps into his home when he felt weak and lightheaded and fell backward onto his back. He states that he was down on the ground for approximately 1 hour unable to get up and he was assisted into his home by an [place] delivery man.  He endorses pain in his back and weakness in his lower extremities.  He reports that his weakness has been ongoing now for some time and is associated with fatigue.  He has also noticed increased abdominal swelling. Pt found to have acute burst fracture of T1 and is being treated conservatively with TLSO brace.    PT Comments    Patient continues to progress well with therapy. He continues to require cues for sequencing log roll technique correctly with bed mobility but required less assistance today. Pt able to ambulate 2x 50' with min guard assist and cues for posture and to maintain safe proximity to walker throughout. He did demonstrate improved carryover for transfers from EOB and recliner (both surfaces elevated) and required min assist to initiate power up. Acute PT will continue to follow and progress as able.   Follow Up Recommendations  SNF;CIR(Pt will need SNF placement if declined from CIR)     Equipment Recommendations  Other (comment)(bariatric walker and 3:1)    Recommendations for Other Services       Precautions / Restrictions Precautions Precautions: Fall Required Braces or Orthoses: Spinal Brace Spinal Brace: Thoracolumbosacral orthotic;Applied in sitting position Spinal Brace Comments: TLSO for  OOB/ambulation Restrictions Weight Bearing Restrictions: No    Mobility  Bed Mobility Overal bed mobility: Needs Assistance Bed Mobility: Sidelying to Sit;Rolling Rolling: Min assist Sidelying to sit: HOB elevated;Min assist       General bed mobility comments: verbal cues required for log rolling technique. pt using bed rails to assist with rolling and to press up from sidelying, pt able to move LE's without assist today.  Transfers Overall transfer level: Needs assistance Equipment used: Rolling walker (2 wheeled) Transfers: Sit to/from Stand Sit to Stand: From elevated surface;+2 safety/equipment;Min assist         General transfer comment: pt performed sit<>stand from elevated EOB with min assist +2 for safety. Pt required verbal cues to push up from bed and recliner for stand. Pt min guard upon rising.  Ambulation/Gait Ambulation/Gait assistance: Min assist;Min guard Gait Distance (Feet): 100 Feet(2 bouts (50' then seated rest break, and then 50' back to room)) Assistive device: Rolling walker (2 wheeled) Gait Pattern/deviations: Step-through pattern;Shuffle;Decreased stride length;Trunk flexed Gait velocity: decreased   General Gait Details: pt ambulated ~ 70' with bariatric walker an d min guard assist with cues to maintain safe proximity to walker. Pt required seated rest break after first bout of gait and tehn was able to ambulate 50' back to room. Cue reqruied for posture throughout.   Stairs             Wheelchair Mobility    Modified Rankin (Stroke Patients Only)       Balance Overall balance assessment: Needs assistance Sitting-balance support: Feet supported;No upper extremity supported Sitting balance-Leahy  Scale: Good     Standing balance support: During functional activity;Bilateral upper extremity supported Standing balance-Leahy Scale: Fair             Cognition Arousal/Alertness: Awake/alert Behavior During Therapy: WFL for tasks  assessed/performed;Anxious Overall Cognitive Status: Within Functional Limits for tasks assessed         Exercises      General Comments        Pertinent Vitals/Pain Pain Assessment: Faces Faces Pain Scale: Hurts even more Pain Location: low back Pain Descriptors / Indicators: Sore;Aching;Discomfort;Grimacing Pain Intervention(s): Limited activity within patient's tolerance;Monitored during session;Repositioned           PT Goals (current goals can now be found in the care plan section) Acute Rehab PT Goals Patient Stated Goal: to be able to get up and walk again PT Goal Formulation: With patient Time For Goal Achievement: 02/15/19 Potential to Achieve Goals: Fair Progress towards PT goals: Progressing toward goals    Frequency    Min 3X/week      PT Plan Current plan remains appropriate       AM-PAC PT "6 Clicks" Mobility   Outcome Measure  Help needed turning from your back to your side while in a flat bed without using bedrails?: A Lot Help needed moving from lying on your back to sitting on the side of a flat bed without using bedrails?: A Lot Help needed moving to and from a bed to a chair (including a wheelchair)?: A Lot Help needed standing up from a chair using your arms (e.g., wheelchair or bedside chair)?: A Lot Help needed to walk in hospital room?: A Little Help needed climbing 3-5 steps with a railing? : Total 6 Click Score: 12    End of Session Equipment Utilized During Treatment: Back brace;Gait belt Activity Tolerance: Patient tolerated treatment well Patient left: with call bell/phone within reach;in chair;with chair alarm set Nurse Communication: Mobility status PT Visit Diagnosis: Muscle weakness (generalized) (M62.81);Difficulty in walking, not elsewhere classified (R26.2);History of falling (Z91.81)     Time: [id number] PT Time Calculation (min) (ACUTE ONLY): 30 min  Charges:  $Gait Training: 8-22 mins $Therapeutic Activity: 8-22  mins                     Kipp Brood, PT, DPT Physical Therapist with [facility]  02/05/2019 2:09 PM

## 2019-02-05 NOTE — TOC Progression Note (Addendum)
Transition of Care [facility]) - Progression Note    Patient Details  Name: Gregory Hansen MRN: [id number] Date of Birth: 1951-01-02  Transition of Care [facility]) CM/SW [place], LCSW Phone Number: 02/05/2019, 3:35 PM  Clinical Narrative:   CSW following patient for support and discharge needs. CSW spoke with admission coordinator Juliann Pulse from [facility] and she stated that she will be abel to take patient tomorrow (Saturday) as long as he has authorization from his insurance and a current negative covid test. Juliann Pulse is aware that patients pasrr is pending but stated that as long as it has been started she will accept patient tomorrow. CSW awaiting authorization approval and covid test .   Update:  CSW received phone call from [facility] giving patient approval to discharge to a SNF for short term rehab. MD made aware         Expected Discharge Plan and Services                                                 Social Determinants of Health (SDOH) Interventions    Readmission Risk Interventions No flowsheet data found.

## 2019-02-05 NOTE — Progress Notes (Addendum)
PROGRESS NOTE  Gregory Hansen QMV:[id number] DOB: May 10, 1950   PCP: [name], DO  Patient is from: Home.  Independently ambulates at baseline.  Lives with his son.  DOA: 01/26/2019 LOS: 10  Brief Narrative / Interim history: 68 year old male with history of CAD/MI s/p CABG x4 in 2015, ankylosing spondylitis, HTN, DM-2, alcohol use disorder, depression, GERD and peripheral neuropathy brought to ED by EMS after found down at home by [employer].  Reportedly felt weak and lightheaded and fell walking up the steps at home and could not get up for about 30 minutes until the [employer] found him down.  Denies hitting his head or loss of consciousness.  Reportedly had lower extremity weakness, muscle pains and fatigue for sometimes.  Nausea but no emesis.  Endorses 3-4 shots of bourbon per day.  No history of DT or alcohol withdrawal seizure.  Denies smoking cigarettes or drug use.  Denies Tylenol.  In ED, afebrile and HDS.  Hgb 10.6.  Platelets 61.  Sodium 128.  CO2 17.  K3.4.  Mg 1.5.  Cr 2.92.  BUN 39.  AST 1632.  ALT 374.  ALP 246.  CK 33,000.  LDH 1200.  High-sensitivity troponin 26.  EKG NSR with nonspecific IVCD but no acute ischemic finding.  CT lumbar spine revealed acute burst fracture of L1 without body height loss of spinal cord involvement.  Neurosurgery consulted and recommended TLSO and outpatient follow-up in 2 weeks.  Started on aggressive IV fluids hydration and electrolyte replacement and admitted for fall, rhabdo, AKI, elevated liver enzymes, hyponatremia and lumbar fracture.  Assessment & Plan: Fall at home/L1 fracture/Traumatic rhabdomyolysis: felt weak, lightheaded and fell.  No head impaction or LOC. -Alcohol could have contributed to his fall in addition to his underlying lower extremity weakness and muscle pain -CK 33,000 (admit)>>> 5000> 3120.  Will repeat tomorrow. -L1 burst fracture w/o body height loss or spinal cord involvement-TLSO and outpatient  follow-up in 2 wks per NS  -History suspicious for statin intolerance-he is on very high-dose-could benefit from PCSK9 inhibitors -As needed pain meds with good bowel regimen for pain control  AKI with azotemia: Cr 1.0 (baseline)> 2.92 (admit)> 2.85>1.29> 1.09> 0.87-suspect ATN due to rhabdo.  Good UOP.   Non-anion gap metabolic acidosis: Resolved.  Elevated liver enzymes/elevated ALP/hyperbilirubinemia/hepatic steatosis/alcoholic hepatitis/shock liver: Likely either due to rhabdo vs EtOH and statin or shock liver due to being on the floor.  RUQ [place] with hepatic steatosis.  Improving. -Counseled about alcohol-confident about cessation -Would avoid statin in near future.  Consider resuming back after some time. -Continue monitoring.    Alcohol use disorder: Reports drinking at least 3-4 bourbons a day.  -Encourage cessation-determined to quit -No signs of withdrawal.  Out of window for withdrawal.  Discontinue CIWA protocol but continue multivitamin and folate.  Hyponatremia: beer potomania, AKI and possibly SSRI.  Resolved  Hypokalemia/hypomagnesemia Low both of them, will replace and recheck in am.   Elevated PT/INR/coagulopathy: Likely due to acute hepatitis.  Improved -Continue monitoring  Anemia of chronic disease: Hgb 11-12 (baseline)> 10.6 admit> 10.1>9.5> 9.8. No melena or hematochezia.  Stable. -Continue monitoring  Elevated troponin/BNP/history of CAD/CABG x4 in 2015: lightheadedness and weakness likely from alcohol.  No cardiopulmonary symptoms now.  Mild troponin and BNP elevation likely delayed clearance.  EKG NSR with nonspecific IVCD but no ischemic finding -Continue monitoring -Continue home cardiac meds except statin-suspect intolerance -Outpatient cardiology follow-up for PCSK9 inhibitors  Controlled DM-2: A1c 5.1%.  CBG within fair range.  Has not needed SSI.  -Discontinued CBG monitoring and SSI -BMP glucose within normal  Essential hypertension: Blood pressure  controlled.  Continue amlodipine, losartan and Toprol-XL.  History of ankylosing spondylitis: Not on medications. -Outpatient follow-up  Depression: Stable -Continue home Wellbutrin and SSRI.  Bilateral lower extremity weakness/muscle pain: suspect this to be due to statin.  His alcohol could contribute.  CRP, ESR and aldolase elevated.  Seen by PT OT and they recommend inpatient rehab.  Did peer to peer with insurance company last night after I received a message from [place].  Unfortunately, he was denied CIR however SNF was approved.  Discussed with case Freight forwarder.  Authorization and pasar pending.  Will need COVID-19 negative test prior to discharge.  Will order 1.  Bilateral lower extremity edema: No improvement.  Continue Lasix 20 mg IV twice daily.   DVT prophylaxis: Subcu heparin Code Status: Full code Family Communication: No family member present.  Plan of care discussed with patient who verbalized understanding. Disposition Plan: Discharge to CIR once insurance approval obtained which is a still pending. Consultants: None  Subjective: Seen and examined.  Feels much better.  Walked with physical therapy.  No complaint.  Objective: Vitals:   02/04/19 0657 02/04/19 1419 02/04/19 2245 02/05/19 0649  BP: 132/77 (!) 153/68 140/74 138/70  Pulse: 75 87 80 76  Resp: '18 16 16 16  ' Temp: 97.7 F (36.5 C) 98.3 F (36.8 C) 98.6 F (37 C) 98.2 F (36.8 C)  TempSrc: Oral Oral Oral Oral  SpO2: 97% 94% 98% 97%  Weight:      Height:        Intake/Output Summary (Last 24 hours) at 02/05/2019 1027 Last data filed at 02/05/2019 0600 Gross per 24 hour  Intake 1080 ml  Output 2975 ml  Net -1895 ml   Filed Weights   01/27/19 0500 02/02/19 0607 02/03/19 0628  Weight: 118.9 kg 131.2 kg 131.8 kg    Examination:  General exam: Appears calm and comfortable sitting in the chair with TLSO brace on. Respiratory system: Clear to auscultation. Respiratory effort  normal. Cardiovascular system: S1 & S2 heard, RRR. No JVD, murmurs, rubs, gallops or clicks.  +2 pitting edema bilateral lower extremity Gastrointestinal system: Abdomen is nondistended, soft and nontender. No organomegaly or masses felt. Normal bowel sounds heard. Central nervous system: Alert and oriented. No focal neurological deficits. Extremities: Symmetric 5 x 5 power. Skin: No rashes, lesions or ulcers.  Psychiatry: Judgement and insight appear normal. Mood & affect appropriate.    Procedures:  None  Microbiology summarized: [id number] negative. MRSA PCR negative.  Sch Meds:  Scheduled Meds: . amLODipine  5 mg Oral Daily  . buPROPion  150 mg Oral QODAY  . chlorhexidine  15 mL Mouth Rinse BID  . docusate sodium  100 mg Oral BID  . fluticasone  1 spray Each Nare Daily  . folic acid  1 mg Oral Daily  . furosemide  20 mg Intravenous BID  . heparin injection (subcutaneous)  5,000 Units Subcutaneous Q8H  . losartan  50 mg Oral Daily  . mouth rinse  15 mL Mouth Rinse q12n4p  . metoprolol succinate  50 mg Oral Daily  . multivitamin with minerals  1 tablet Oral Daily  . pantoprazole  40 mg Oral Daily  . polyethylene glycol  17 g Oral Daily  . potassium chloride  40 mEq Oral Q4H  . senna  1 tablet Oral BID  . thiamine  100 mg Oral Daily  .  vitamin B-12  1,000 mcg Oral Daily   Continuous Infusions: . magnesium sulfate bolus IVPB     PRN Meds:.ondansetron **OR** ondansetron (ZOFRAN) IV, oxyCODONE  Antimicrobials: Anti-infectives (From admission, onward)   None       I have personally reviewed the following labs and images: CBC: Recent Labs  Lab 02/01/19 0512 02/02/19 0554 02/03/19 0534 02/04/19 0709 02/05/19 0506  WBC 6.6 5.7 6.3 5.2 6.1  NEUTROABS 3.8 3.0 3.4 2.6 3.2  HGB 9.8* 10.0* 10.1* 9.7* 9.9*  HCT 31.7* 32.7* 33.2* 31.0* 31.7*  MCV 99.4 101.2* 102.2* 98.4 99.7  PLT 124* 140* 156 147* 159   BMP &GFR Recent Labs  Lab 01/30/19 0602 01/31/19 0601  02/01/19 0512 02/02/19 0554 02/03/19 0534 02/05/19 0506  NA 136 136 138 135 136 135  K 3.7 3.9 3.7 4.0 3.6 3.2*  CL 114* 113* 115* 112* 109 104  CO2 17* 17* 18* 18* 21* 25  GLUCOSE 111* 118* 123* 101* 100* 105*  BUN 26* 25* '20 19 19 17  ' CREATININE 1.29* 1.09 0.87 0.84 0.87 0.82  CALCIUM 7.4* 7.6* 7.6* 7.8* 8.1* 8.0*  MG 1.4* 1.5* 1.1* 1.5*  --  1.1*  PHOS  --  2.3*  --   --   --   --    Estimated Creatinine Clearance: 122.7 mL/min (by C-G formula based on SCr of 0.82 mg/dL). Liver & Pancreas: Recent Labs  Lab 01/31/19 0601 02/01/19 0512 02/02/19 0554 02/03/19 0534 02/05/19 0506  AST 446* 297* 219* 195* 135*  ALT 226* 189* 164* 161* 124*  ALKPHOS 201* 181* 154* 178* 166*  BILITOT 3.0* 2.4* 2.5* 2.1* 1.9*  PROT 5.7* 5.5* 5.2* 5.8* 6.0*  ALBUMIN 2.0* 1.8* 1.9* 2.0* 2.3*   No results for input(s): LIPASE, AMYLASE in the last 168 hours. No results for input(s): AMMONIA in the last 168 hours. Diabetic: No results for input(s): HGBA1C in the last 72 hours. Recent Labs  Lab 01/30/19 0736 01/30/19 1201 01/30/19 1700 01/31/19 0731 01/31/19 1650  GLUCAP 104* 110* 86 93 90   Cardiac Enzymes: Recent Labs  Lab 01/30/19 0602 01/31/19 0601 02/05/19 0506  CKTOTAL 4,894* 3,120* 355   No results for input(s): PROBNP in the last 8760 hours. Coagulation Profile: Recent Labs  Lab 01/30/19 0602 01/31/19 0601  INR 1.3* 1.2   Thyroid Function Tests: No results for input(s): TSH, T4TOTAL, FREET4, T3FREE, THYROIDAB in the last 72 hours. Lipid Profile: No results for input(s): CHOL, HDL, LDLCALC, TRIG, CHOLHDL, LDLDIRECT in the last 72 hours. Anemia Panel: No results for input(s): VITAMINB12, FOLATE, FERRITIN, TIBC, IRON, RETICCTPCT in the last 72 hours. Urine analysis:    Component Value Date/Time   COLORURINE YELLOW 01/26/2019 2242   APPEARANCEUR CLEAR 01/26/2019 2242   LABSPEC 1.008 01/26/2019 2242   PHURINE 6.0 01/26/2019 2242   GLUCOSEU NEGATIVE 01/26/2019 2242    HGBUR LARGE (A) 01/26/2019 2242   [place] NEGATIVE 01/26/2019 [place] 01/26/2019 2242   PROTEINUR 100 (A) 01/26/2019 2242   UROBILINOGEN 1.0 10/28/2013 1857   NITRITE NEGATIVE 01/26/2019 2242   LEUKOCYTESUR NEGATIVE 01/26/2019 2242   Sepsis Labs: Invalid input(s): PROCALCITONIN, [place]  Microbiology: Recent Results (from the past 240 hour(s))  SARS CORONAVIRUS 2 (TAT 6-24 HRS) Nasopharyngeal Nasopharyngeal Swab     Status: None   Collection Time: 01/26/19  7:16 PM   Specimen: Nasopharyngeal Swab  Result Value Ref Range Status   SARS Coronavirus 2 NEGATIVE NEGATIVE Final    Comment: (NOTE) SARS-CoV-2 target nucleic  acids are NOT DETECTED. The SARS-CoV-2 RNA is generally detectable in upper and lower respiratory specimens during the acute phase of infection. Negative results do not preclude SARS-CoV-2 infection, do not rule out co-infections with other pathogens, and should not be used as the sole basis for treatment or other patient management decisions. Negative results must be combined with clinical observations, patient history, and epidemiological information. The expected result is Negative. Fact Sheet for Patients: SugarRoll.be Fact Sheet for Healthcare Providers: https://www.woods-mathews.com/ This test is not yet approved or cleared by the [place] FDA and  has been authorized for detection and/or diagnosis of SARS-CoV-2 by FDA under an Emergency Use Authorization (EUA). This EUA will remain  in effect (meaning this test can be used) for the duration of the COVID-19 declaration under Section 56 4(b)(1) of the Act, 21 U.S.C. section 360bbb-3(b)(1), unless the authorization is terminated or revoked sooner. Performed at [facility], [place] [address] [place], [place] [address]   MRSA PCR Screening     Status: None   Collection Time: 01/27/19 10:00 AM   Specimen: Nasal Mucosa; Nasopharyngeal   Result Value Ref Range Status   MRSA by PCR NEGATIVE NEGATIVE Final    Comment:        The GeneXpert MRSA Assay (FDA approved for NASAL specimens only), is one component of a comprehensive MRSA colonization surveillance program. It is not intended to diagnose MRSA infection nor to guide or monitor treatment for MRSA infections. Performed at [facility], [place] [address], [place], [place] [address]     Radiology Studies: No results found.  Darliss Cheney, MD [facility]  If 7PM-7AM, please contact night-coverage [email or web address] Password [facility] 02/05/2019, 10:27 AM

## 2019-02-06 ENCOUNTER — Inpatient Hospital Stay (HOSPITAL_COMMUNITY): Payer: Medicare Other

## 2019-02-06 DIAGNOSIS — R609 Edema, unspecified: Secondary | ICD-10-CM

## 2019-02-06 DIAGNOSIS — M7989 Other specified soft tissue disorders: Secondary | ICD-10-CM

## 2019-02-06 DIAGNOSIS — R600 Localized edema: Secondary | ICD-10-CM

## 2019-02-06 LAB — CBC WITH DIFFERENTIAL/PLATELET
Abs Immature Granulocytes: 0.03 10*3/uL (ref 0.00–0.07)
Basophils Absolute: 0.1 10*3/uL (ref 0.0–0.1)
Basophils Relative: 1 %
Eosinophils Absolute: 0.3 10*3/uL (ref 0.0–0.5)
Eosinophils Relative: 5 %
HCT: 32.5 % — ABNORMAL LOW (ref 39.0–52.0)
Hemoglobin: 10.2 g/dL — ABNORMAL LOW (ref 13.0–17.0)
Immature Granulocytes: 0 %
Lymphocytes Relative: 18 %
Lymphs Abs: 1.2 10*3/uL (ref 0.7–4.0)
MCH: 31.3 pg (ref 26.0–34.0)
MCHC: 31.4 g/dL (ref 30.0–36.0)
MCV: 99.7 fL (ref 80.0–100.0)
Monocytes Absolute: 1 10*3/uL (ref 0.1–1.0)
Monocytes Relative: 15 %
Neutro Abs: 4.1 10*3/uL (ref 1.7–7.7)
Neutrophils Relative %: 61 %
Platelets: 162 10*3/uL (ref 150–400)
RBC: 3.26 MIL/uL — ABNORMAL LOW (ref 4.22–5.81)
RDW: 23.5 % — ABNORMAL HIGH (ref 11.5–15.5)
WBC: 6.7 10*3/uL (ref 4.0–10.5)
nRBC: 0 % (ref 0.0–0.2)

## 2019-02-06 LAB — BASIC METABOLIC PANEL
Anion gap: 7 (ref 5–15)
BUN: 13 mg/dL (ref 8–23)
CO2: 25 mmol/L (ref 22–32)
Calcium: 7.9 mg/dL — ABNORMAL LOW (ref 8.9–10.3)
Chloride: 102 mmol/L (ref 98–111)
Creatinine, Ser: 0.84 mg/dL (ref 0.61–1.24)
GFR calc Af Amer: >60 mL/min (ref >60)
GFR calc non Af Amer: >60 mL/min (ref >60)
Glucose, Bld: 103 mg/dL — ABNORMAL HIGH (ref 70–99)
Potassium: 3.1 mmol/L — ABNORMAL LOW (ref 3.5–5.1)
Sodium: 134 mmol/L — ABNORMAL LOW (ref 135–145)

## 2019-02-06 LAB — SARS CORONAVIRUS 2 (TAT 6-24 HRS): SARS Coronavirus 2: NEGATIVE

## 2019-02-06 LAB — MAGNESIUM: Magnesium: 1.4 mg/dL — ABNORMAL LOW (ref 1.7–2.4)

## 2019-02-06 MED ORDER — SODIUM CHLORIDE 0.9 % IV SOLN
2.0000 g | INTRAVENOUS | Status: DC
  Administered 2019-02-06 – 2019-02-08 (×3): 2 g via INTRAVENOUS
  Filled 2019-02-06 (×3): qty 2

## 2019-02-06 MED ORDER — MAGNESIUM SULFATE 2 GM/50ML IV SOLN
2.0000 g | Freq: Four times a day (QID) | INTRAVENOUS | Status: AC
  Administered 2019-02-06 (×2): 2 g via INTRAVENOUS
  Filled 2019-02-06 (×2): qty 50

## 2019-02-06 MED ORDER — POTASSIUM CHLORIDE CRYS ER 20 MEQ PO TBCR
40.0000 meq | EXTENDED_RELEASE_TABLET | ORAL | Status: AC
  Administered 2019-02-06 (×3): 40 meq via ORAL
  Filled 2019-02-06 (×3): qty 2

## 2019-02-06 NOTE — Progress Notes (Signed)
PROGRESS NOTE  Gregory Hansen VQQ:[id number] DOB: [birth year]   PCP: [name], DO  Patient is from: Home.  Independently ambulates at baseline.  Lives with his son.  DOA: 01/26/2019 LOS: 11  Brief Narrative / Interim history: 68 year old male with history of CAD/MI s/p CABG x4 in 2015, ankylosing spondylitis, HTN, DM-2, alcohol use disorder, depression, GERD and peripheral neuropathy brought to ED by EMS after found down at home by [employer].  Reportedly felt weak and lightheaded and fell walking up the steps at home and could not get up for about 30 minutes until the [employer] found him down.  Denies hitting his head or loss of consciousness.  Reportedly had lower extremity weakness, muscle pains and fatigue for sometimes.  Nausea but no emesis.  Endorses 3-4 shots of bourbon per day.  No history of DT or alcohol withdrawal seizure.  Denies smoking cigarettes or drug use.  Denies Tylenol.  In ED, afebrile and HDS.  Hgb 10.6.  Platelets 61.  Sodium 128.  CO2 17.  K3.4.  Mg 1.5.  Cr 2.92.  BUN 39.  AST 1632.  ALT 374.  ALP 246.  CK 33,000.  LDH 1200.  High-sensitivity troponin 26.  EKG NSR with nonspecific IVCD but no acute ischemic finding.  CT lumbar spine revealed acute burst fracture of L1 without body height loss of spinal cord involvement.  Neurosurgery consulted and recommended TLSO and outpatient follow-up in 2 weeks.  Started on aggressive IV fluids hydration and electrolyte replacement and admitted for fall, rhabdo, AKI, elevated liver enzymes, hyponatremia and lumbar fracture.  Assessment & Plan: Fall at home/L1 fracture/Traumatic rhabdomyolysis: felt weak, lightheaded and fell.  No head impaction or LOC. -Alcohol could have contributed to his fall in addition to his underlying lower extremity weakness and muscle pain -CK 33,000 (admit)>>> 5000> 3120> 35 -L1 burst fracture w/o body height loss or spinal cord involvement-TLSO and outpatient follow-up in 2 wks per NS   -History suspicious for statin intolerance-he is on very high-dose-could benefit from PCSK9 inhibitors -As needed pain meds with good bowel regimen for pain control  AKI with azotemia: Cr 1.0 (baseline)> 2.92 (admit)> 2.85>1.29> 1.09> 0.87-suspect ATN due to rhabdo.  Good UOP.   Non-anion gap metabolic acidosis: Resolved.  Elevated liver enzymes/elevated ALP/hyperbilirubinemia/hepatic steatosis/alcoholic hepatitis/shock liver: Likely either due to rhabdo vs EtOH and statin or shock liver due to being on the floor.  RUQ [place] with hepatic steatosis.  Improving. -Counseled about alcohol-confident about cessation -Would avoid statin in near future.  Consider resuming back after some time. -Continue monitoring.    Alcohol use disorder: Reports drinking at least 3-4 bourbons a day.  -Encourage cessation-determined to quit -No signs of withdrawal.  Out of window for withdrawal.  Discontinue CIWA protocol but continue multivitamin and folate.  Hyponatremia: beer potomania, AKI and possibly SSRI.  Resolved  Hypokalemia/hypomagnesemia 3.1 again.  Will replace orally.  Checking magnesium.  Elevated PT/INR/coagulopathy: Likely due to acute hepatitis.  Improved -Continue monitoring  Anemia of chronic disease: Hgb 11-12 (baseline)> 10.6 admit> 10.1>9.5> 9.8. No melena or hematochezia.  Stable. -Continue monitoring  Elevated troponin/BNP/history of CAD/CABG x4 in 2015: lightheadedness and weakness likely from alcohol.  No cardiopulmonary symptoms now.  Mild troponin and BNP elevation likely delayed clearance.  EKG NSR with nonspecific IVCD but no ischemic finding -Continue monitoring -Continue home cardiac meds except statin-suspect intolerance -Outpatient cardiology follow-up for PCSK9 inhibitors  Controlled DM-2: A1c 5.1%.  CBG within fair range.  Has not needed SSI.  -  Discontinued CBG monitoring and SSI -BMP glucose within normal  Essential hypertension: Blood pressure controlled.  Continue  amlodipine, losartan and Toprol-XL.  History of ankylosing spondylitis: Not on medications. -Outpatient follow-up  Depression: Stable -Continue home Wellbutrin and SSRI.  Bilateral lower extremity weakness/muscle pain: suspect this to be due to statin.  His alcohol could contribute.  CRP, ESR and aldolase elevated.  Seen by PT OT and they recommend inpatient rehab.  Did peer to peer with insurance company last night after I received a message from [place].  Unfortunately, he was denied CIR however SNF was approved.  Discussed with case Freight forwarder.   pasar pending but authorization received.  Ordered COVID-19 test yesterday in order to prepare for discharge today however unfortunately, the test was not collected until this morning.  Bilateral lower extremity edema/cellulitis: His edema has improved somewhat however now he has developed bilateral lower extremity cellulitis, right worse than the left.  Right lower extremity is more edematous today.  Concern for DVT.  Will order Doppler lower extremity.  Start on Rocephin for cellulitis.  We will keep in the hospital to see improvement.     DVT prophylaxis: Subcu heparin Code Status: Full code Family Communication: No family member present.  Plan of care discussed with patient who verbalized understanding. Disposition Plan: Discharge to CIR once insurance approval obtained which is a still pending. Consultants: None  Subjective: Seen and examined.  He has no complaint.  Denied any lower extremity pain.  He tells me that due to his neuropathy, he usually does not feel any pain.  Has a lot of scabs on the right lower extremity but denies any scratching.      Objective: Vitals:   02/05/19 0649 02/05/19 1349 02/05/19 2037 02/06/19 0609  BP: 138/70 (!) 158/74 (!) 148/63 (!) 155/70  Pulse: 76 78 89 85  Resp: '16 18 20 18  ' Temp: 98.2 F (36.8 C) 97.8 F (36.6 C) 98.5 F (36.9 C) 98.8 F (37.1 C)  TempSrc: Oral Oral Oral Oral  SpO2:  97% 99% 99% 98%  Weight:      Height:        Intake/Output Summary (Last 24 hours) at 02/06/2019 0929 Last data filed at 02/06/2019 0915 Gross per 24 hour  Intake 1440 ml  Output 3175 ml  Net -1735 ml   Filed Weights   01/27/19 0500 02/02/19 0607 02/03/19 0628  Weight: 118.9 kg 131.2 kg 131.8 kg    Examination:  General exam: Appears calm and comfortable  Respiratory system: Clear to auscultation. Respiratory effort normal. Cardiovascular system: S1 & S2 heard, RRR. No JVD, murmurs, rubs, gallops or clicks.  +2 pitting edema bilateral lower extremity Gastrointestinal system: Abdomen is nondistended, soft and nontender. No organomegaly or masses felt. Normal bowel sounds heard. Central nervous system: Alert and oriented. No focal neurological deficits. Extremities: Symmetric 5 x 5 power. Skin: Confluent erythema involving most of the right half of the leg and some part of the skin about the left ankle.  It is warm but nontender.  There are no open sores. Psychiatry: Judgement and insight appear normal. Mood & affect appropriate.    Procedures:  None  Microbiology summarized: [id number] negative. MRSA PCR negative.  Sch Meds:  Scheduled Meds: . amLODipine  5 mg Oral Daily  . buPROPion  150 mg Oral QODAY  . chlorhexidine  15 mL Mouth Rinse BID  . docusate sodium  100 mg Oral BID  . fluticasone  1 spray Each Nare Daily  .  folic acid  1 mg Oral Daily  . furosemide  20 mg Intravenous BID  . heparin injection (subcutaneous)  5,000 Units Subcutaneous Q8H  . losartan  50 mg Oral Daily  . mouth rinse  15 mL Mouth Rinse q12n4p  . metoprolol succinate  50 mg Oral Daily  . multivitamin with minerals  1 tablet Oral Daily  . pantoprazole  40 mg Oral Daily  . polyethylene glycol  17 g Oral Daily  . senna  1 tablet Oral BID  . thiamine  100 mg Oral Daily  . vitamin B-12  1,000 mcg Oral Daily   Continuous Infusions: . cefTRIAXone (ROCEPHIN)  IV     PRN Meds:.ondansetron **OR**  ondansetron (ZOFRAN) IV, oxyCODONE  Antimicrobials: Anti-infectives (From admission, onward)   Start     Dose/Rate Route Frequency Ordered Stop   02/06/19 1000  cefTRIAXone (ROCEPHIN) 2 g in sodium chloride 0.9 % 100 mL IVPB     2 g 200 mL/hr over 30 Minutes Intravenous Every 24 hours 02/06/19 0845         I have personally reviewed the following labs and images: CBC: Recent Labs  Lab 02/02/19 0554 02/03/19 0534 02/04/19 0709 02/05/19 0506 02/06/19 0523  WBC 5.7 6.3 5.2 6.1 6.7  NEUTROABS 3.0 3.4 2.6 3.2 4.1  HGB 10.0* 10.1* 9.7* 9.9* 10.2*  HCT 32.7* 33.2* 31.0* 31.7* 32.5*  MCV 101.2* 102.2* 98.4 99.7 99.7  PLT 140* 156 147* 159 162   BMP &GFR Recent Labs  Lab 01/31/19 0601 02/01/19 0512 02/02/19 0554 02/03/19 0534 02/05/19 0506 02/06/19 0817  NA 136 138 135 136 135 134*  K 3.9 3.7 4.0 3.6 3.2* 3.1*  CL 113* 115* 112* 109 104 102  CO2 17* 18* 18* 21* 25 25  GLUCOSE 118* 123* 101* 100* 105* 103*  BUN 25* '20 19 19 17 13  ' CREATININE 1.09 0.87 0.84 0.87 0.82 0.84  CALCIUM 7.6* 7.6* 7.8* 8.1* 8.0* 7.9*  MG 1.5* 1.1* 1.5*  --  1.1*  --   PHOS 2.3*  --   --   --   --   --    Estimated Creatinine Clearance: 119.7 mL/min (by C-G formula based on SCr of 0.84 mg/dL). Liver & Pancreas: Recent Labs  Lab 01/31/19 0601 02/01/19 0512 02/02/19 0554 02/03/19 0534 02/05/19 0506  AST 446* 297* 219* 195* 135*  ALT 226* 189* 164* 161* 124*  ALKPHOS 201* 181* 154* 178* 166*  BILITOT 3.0* 2.4* 2.5* 2.1* 1.9*  PROT 5.7* 5.5* 5.2* 5.8* 6.0*  ALBUMIN 2.0* 1.8* 1.9* 2.0* 2.3*   No results for input(s): LIPASE, AMYLASE in the last 168 hours. No results for input(s): AMMONIA in the last 168 hours. Diabetic: No results for input(s): HGBA1C in the last 72 hours. Recent Labs  Lab 01/30/19 1201 01/30/19 1700 01/31/19 0731 01/31/19 1650  GLUCAP 110* 86 93 90   Cardiac Enzymes: Recent Labs  Lab 01/31/19 0601 02/05/19 0506  CKTOTAL 3,120* 355   No results for input(s):  PROBNP in the last 8760 hours. Coagulation Profile: Recent Labs  Lab 01/31/19 0601  INR 1.2   Thyroid Function Tests: No results for input(s): TSH, T4TOTAL, FREET4, T3FREE, THYROIDAB in the last 72 hours. Lipid Profile: No results for input(s): CHOL, HDL, LDLCALC, TRIG, CHOLHDL, LDLDIRECT in the last 72 hours. Anemia Panel: No results for input(s): VITAMINB12, FOLATE, FERRITIN, TIBC, IRON, RETICCTPCT in the last 72 hours. Urine analysis:    Component Value Date/Time   COLORURINE YELLOW 01/26/2019 2242  APPEARANCEUR CLEAR 01/26/2019 2242   LABSPEC 1.008 01/26/2019 2242   PHURINE 6.0 01/26/2019 2242   GLUCOSEU NEGATIVE 01/26/2019 2242   HGBUR LARGE (A) 01/26/2019 2242   BILIRUBINUR NEGATIVE 01/26/2019 [place] 01/26/2019 2242   PROTEINUR 100 (A) 01/26/2019 2242   UROBILINOGEN 1.0 10/28/2013 1857   NITRITE NEGATIVE 01/26/2019 2242   LEUKOCYTESUR NEGATIVE 01/26/2019 2242   Sepsis Labs: Invalid input(s): PROCALCITONIN, [place]  Microbiology: Recent Results (from the past 240 hour(s))  MRSA PCR Screening     Status: None   Collection Time: 01/27/19 10:00 AM   Specimen: Nasal Mucosa; Nasopharyngeal  Result Value Ref Range Status   MRSA by PCR NEGATIVE NEGATIVE Final    Comment:        The GeneXpert MRSA Assay (FDA approved for NASAL specimens only), is one component of a comprehensive MRSA colonization surveillance program. It is not intended to diagnose MRSA infection nor to guide or monitor treatment for MRSA infections. Performed at [facility], [place] [address], [place], [place] [address]     Radiology Studies: No results found.  Darliss Cheney, MD [facility]  If 7PM-7AM, please contact night-coverage [email or web address] Password [facility] 02/06/2019, 9:29 AM

## 2019-02-06 NOTE — Progress Notes (Addendum)
Lower extremity doppler completed, results located under CV proc.  Gregory Hansen 02/06/2019, 2:41 PM

## 2019-02-06 NOTE — Progress Notes (Signed)
Physical Therapy Treatment Patient Details Name: Gregory Hansen MRN: [id number] DOB: 07-19-1950 Today's Date: 02/06/2019    History of Present Illness Gregory Hansen is a 68 y.o. male with medical history significant for CAD/MI status post CABG, ankylosing spondylitis, hypertension, diabetes mellitus type 2 and alcohol abuse who presented to the ED from home after a mechanical fall in his garage.  Patient reports that he was walking up the steps into his home when he felt weak and lightheaded and fell backward onto his back. He states that he was down on the ground for approximately 1 hour unable to get up and he was assisted into his home by an [place] delivery man.  He endorses pain in his back and weakness in his lower extremities.  He reports that his weakness has been ongoing now for some time and is associated with fatigue.  He has also noticed increased abdominal swelling. Pt found to have acute burst fracture of T1 and is being treated conservatively with TLSO brace.    PT Comments    Assisted OOB to amb required increased time with each position change die to pain.  Educated pt on proper donning brace while EOB.  Assisted with amb in hallway.  Educated on doffing brace then assisted back to bed.  Pt progressing slolwy and will need ST Rehab prior to returning home.     Follow Up Recommendations  SNF;CIR     Equipment Recommendations       Recommendations for Other Services       Precautions / Restrictions Precautions Precautions: Fall Required Braces or Orthoses: Spinal Brace Spinal Brace: Thoracolumbosacral orthotic;Applied in sitting position Spinal Brace Comments: TLSO for OOB/ambulation    Mobility  Bed Mobility Overal bed mobility: Needs Assistance Bed Mobility: Sidelying to Sit;Rolling Rolling: Min assist Sidelying to sit: HOB elevated;Min assist Supine to sit: HOB elevated;Min assist Sit to supine: Mod assist;HOB elevated Sit to sidelying: Mod assist General bed  mobility comments: verbal cues required for log rolling technique. pt using bed rails to assist with rolling and to press up from sidelying, pt able to move LE's without assist today.  Transfers                    Ambulation/Gait Ambulation/Gait assistance: Min assist;Min guard Gait Distance (Feet): 115 Feet   Gait Pattern/deviations: Step-through pattern;Shuffle;Decreased stride length;Trunk flexed Gait velocity: decreased   General Gait Details: tolerated an increased distance however required x 5 standing rest breaks.  Heavy use of walker for support.   Stairs             Wheelchair Mobility    Modified Rankin (Stroke Patients Only)       Balance                                            Cognition Arousal/Alertness: Awake/alert Behavior During Therapy: WFL for tasks assessed/performed;Anxious Overall Cognitive Status: Within Functional Limits for tasks assessed                                 General Comments: mild anxiety      Exercises      General Comments        Pertinent Vitals/Pain Faces Pain Scale: Hurts little more Pain Location: low back Pain Descriptors / Indicators:  Sore;Aching;Discomfort;Grimacing Pain Intervention(s): Monitored during session;Repositioned    Home Living                      Prior Function            PT Goals (current goals can now be found in the care plan section)      Frequency    Min 3X/week      PT Plan Current plan remains appropriate    Co-evaluation              AM-PAC PT "6 Clicks" Mobility   Outcome Measure  Help needed turning from your back to your side while in a flat bed without using bedrails?: A Lot Help needed moving from lying on your back to sitting on the side of a flat bed without using bedrails?: A Lot Help needed moving to and from a bed to a chair (including a wheelchair)?: A Lot Help needed standing up from a chair using  your arms (e.g., wheelchair or bedside chair)?: A Lot Help needed to walk in hospital room?: A Lot Help needed climbing 3-5 steps with a railing? : Total 6 Click Score: 4    End of Session Equipment Utilized During Treatment: Back brace;Gait belt Activity Tolerance: Patient tolerated treatment well Patient left: with call bell/phone within reach;in chair;with chair alarm set Nurse Communication: Mobility status PT Visit Diagnosis: Muscle weakness (generalized) (M62.81);Difficulty in walking, not elsewhere classified (R26.2);History of falling (Z91.81)     Time: [id number] PT Time Calculation (min) (ACUTE ONLY): 29 min  Charges:  $Gait Training: 8-22 mins $Therapeutic Activity: 8-22 mins                     Rica Koyanagi  PTA Acute  Rehabilitation Services Pager      [phone] Office      [phone]

## 2019-02-07 LAB — COMPREHENSIVE METABOLIC PANEL
ALT: 99 U/L — ABNORMAL HIGH (ref 0–44)
AST: 94 U/L — ABNORMAL HIGH (ref 15–41)
Albumin: 2.2 g/dL — ABNORMAL LOW (ref 3.5–5.0)
Alkaline Phosphatase: 159 U/L — ABNORMAL HIGH (ref 38–126)
Anion gap: 10 (ref 5–15)
BUN: 13 mg/dL (ref 8–23)
CO2: 24 mmol/L (ref 22–32)
Calcium: 8 mg/dL — ABNORMAL LOW (ref 8.9–10.3)
Chloride: 98 mmol/L (ref 98–111)
Creatinine, Ser: 0.83 mg/dL (ref 0.61–1.24)
GFR calc Af Amer: >60 mL/min (ref >60)
GFR calc non Af Amer: >60 mL/min (ref >60)
Glucose, Bld: 93 mg/dL (ref 70–99)
Potassium: 3.5 mmol/L (ref 3.5–5.1)
Sodium: 132 mmol/L — ABNORMAL LOW (ref 135–145)
Total Bilirubin: 1.6 mg/dL — ABNORMAL HIGH (ref 0.3–1.2)
Total Protein: 6.2 g/dL — ABNORMAL LOW (ref 6.5–8.1)

## 2019-02-07 LAB — CBC WITH DIFFERENTIAL/PLATELET
Abs Immature Granulocytes: 0.03 10*3/uL (ref 0.00–0.07)
Basophils Absolute: 0.1 10*3/uL (ref 0.0–0.1)
Basophils Relative: 1 %
Eosinophils Absolute: 0.3 10*3/uL (ref 0.0–0.5)
Eosinophils Relative: 4 %
HCT: 30.2 % — ABNORMAL LOW (ref 39.0–52.0)
Hemoglobin: 9.4 g/dL — ABNORMAL LOW (ref 13.0–17.0)
Immature Granulocytes: 0 %
Lymphocytes Relative: 18 %
Lymphs Abs: 1.3 10*3/uL (ref 0.7–4.0)
MCH: 31.3 pg (ref 26.0–34.0)
MCHC: 31.1 g/dL (ref 30.0–36.0)
MCV: 100.7 fL — ABNORMAL HIGH (ref 80.0–100.0)
Monocytes Absolute: 1.1 10*3/uL — ABNORMAL HIGH (ref 0.1–1.0)
Monocytes Relative: 15 %
Neutro Abs: 4.6 10*3/uL (ref 1.7–7.7)
Neutrophils Relative %: 62 %
Platelets: 164 10*3/uL (ref 150–400)
RBC: 3 MIL/uL — ABNORMAL LOW (ref 4.22–5.81)
RDW: 22.8 % — ABNORMAL HIGH (ref 11.5–15.5)
WBC: 7.4 10*3/uL (ref 4.0–10.5)
nRBC: 0 % (ref 0.0–0.2)

## 2019-02-07 LAB — MAGNESIUM: Magnesium: 1.6 mg/dL — ABNORMAL LOW (ref 1.7–2.4)

## 2019-02-07 NOTE — Progress Notes (Signed)
PROGRESS NOTE  JATHAN BALLING LEX:[id number] DOB: 05-05-1950   PCP: [name], DO  Patient is from: Home.  Independently ambulates at baseline.  Lives with his son.  DOA: 01/26/2019 LOS: 12  Brief Narrative / Interim history: 68 year old male with history of CAD/MI s/p CABG x4 in 2015, ankylosing spondylitis, HTN, DM-2, alcohol use disorder, depression, GERD and peripheral neuropathy brought to ED by EMS after found down at home by [employer].  Reportedly felt weak and lightheaded and fell walking up the steps at home and could not get up for about 30 minutes until the [employer] found him down.  Denies hitting his head or loss of consciousness.  Reportedly had lower extremity weakness, muscle pains and fatigue for sometimes.  Nausea but no emesis.  Endorses 3-4 shots of bourbon per day.  No history of DT or alcohol withdrawal seizure.  Denies smoking cigarettes or drug use.  Denies Tylenol.  In ED, afebrile and HDS.  Hgb 10.6.  Platelets 61.  Sodium 128.  CO2 17.  K3.4.  Mg 1.5.  Cr 2.92.  BUN 39.  AST 1632.  ALT 374.  ALP 246.  CK 33,000.  LDH 1200.  High-sensitivity troponin 26.  EKG NSR with nonspecific IVCD but no acute ischemic finding.  CT lumbar spine revealed acute burst fracture of L1 without body height loss of spinal cord involvement.  Neurosurgery consulted and recommended TLSO and outpatient follow-up in 2 weeks.  Started on aggressive IV fluids hydration and electrolyte replacement and admitted for fall, rhabdo, AKI, elevated liver enzymes, hyponatremia and lumbar fracture.  Assessment & Plan: Fall at home/L1 fracture/Traumatic rhabdomyolysis: felt weak, lightheaded and fell.  No head impaction or LOC. -Alcohol could have contributed to his fall in addition to his underlying lower extremity weakness and muscle pain -CK 33,000 (admit)>>> 5000> 3120> 35 -L1 burst fracture w/o body height loss or spinal cord involvement-TLSO and outpatient follow-up in 2 wks per NS   -History suspicious for statin intolerance-he is on very high-dose-could benefit from PCSK9 inhibitors -As needed pain meds with good bowel regimen for pain control  AKI with azotemia: Cr 1.0 (baseline)> 2.92 (admit)> 2.85>1.29> 1.09> 0.87-suspect ATN due to rhabdo.  Good UOP.   Non-anion gap metabolic acidosis: Resolved.  Elevated liver enzymes/elevated ALP/hyperbilirubinemia/hepatic steatosis/alcoholic hepatitis/shock liver: Likely either due to rhabdo vs EtOH and statin or shock liver due to being on the floor.  RUQ [place] with hepatic steatosis.  Improving. -Counseled about alcohol-confident about cessation -Would avoid statin in near future.  Consider resuming back after some time. -Continue monitoring.    Alcohol use disorder: Reports drinking at least 3-4 bourbons a day.  -Encourage cessation-determined to quit -No signs of withdrawal.  Out of window for withdrawal.  Discontinue CIWA protocol but continue multivitamin and folate.  Hyponatremia: beer potomania, AKI and possibly SSRI.  Resolved  Hypokalemia/hypomagnesemia Potassium borderline low at 3.5 and magnesium is still 1.6.  Will replace magnesium as well as potassium today.  Elevated PT/INR/coagulopathy: Likely due to acute hepatitis.  Improved -Continue monitoring  Anemia of chronic disease: Hgb 11-12 (baseline)> 10.6 admit> 10.1>9.5> 9.8. No melena or hematochezia.  Stable. -Continue monitoring  Elevated troponin/BNP/history of CAD/CABG x4 in 2015: lightheadedness and weakness likely from alcohol.  No cardiopulmonary symptoms now.  Mild troponin and BNP elevation likely delayed clearance.  EKG NSR with nonspecific IVCD but no ischemic finding -Continue monitoring -Continue home cardiac meds except statin-suspect intolerance -Outpatient cardiology follow-up for PCSK9 inhibitors  Controlled DM-2: A1c 5.1%.  CBG within fair range.  Has not needed SSI.  -Discontinued CBG monitoring and SSI -BMP glucose within  normal  Essential hypertension: Blood pressure controlled.  Continue amlodipine, losartan and Toprol-XL.  History of ankylosing spondylitis: Not on medications. -Outpatient follow-up  Depression: Stable -Continue home Wellbutrin and SSRI.  Bilateral lower extremity weakness/muscle pain: suspect this to be due to statin.  His alcohol could contribute.  CRP, ESR and aldolase elevated.  Seen by PT OT and they recommend inpatient rehab.  Did peer to peer with insurance company last night after I received a message from [place].  Unfortunately, he was denied CIR however SNF was approved.  Discussed with case Freight forwarder.   pasar pending but authorization received.  Ordered COVID-19 test yesterday in order to prepare for discharge today however unfortunately, the test was not collected until this morning.  Bilateral lower extremity edema/cellulitis: No improvement in erythema.  DVT ruled out.  Continue Rocephin.  Edema improved somewhat.     DVT prophylaxis: Subcu heparin Code Status: Full code Family Communication: No family member present.  Plan of care discussed with patient who verbalized understanding. Disposition Plan: Potential discharge to skilled nursing facility tomorrow. Consultants: None  Subjective: Seen and examined.  He has no complaints.  Erythema in lower extremities with no change compared to yesterday.  When I saw him, he expressed his wishes that he has changed his mind and would like to go to home with home health however 10 minutes later, his daughter called nurse stating that he would now like to go to rehab as originally planned.      Objective: Vitals:   02/06/19 0609 02/06/19 1247 02/06/19 2220 02/07/19 0730  BP: (!) 155/70 (!) 151/84 (!) 147/77 (!) 141/70  Pulse: 85 91 88 82  Resp: '18 18 16 17  ' Temp: 98.8 F (37.1 C) 97.8 F (36.6 C) 98.6 F (37 C) 98.4 F (36.9 C)  TempSrc: Oral Oral Oral Oral  SpO2: 98% 98% 97% 98%  Weight:      Height:         Intake/Output Summary (Last 24 hours) at 02/07/2019 1015 Last data filed at 02/07/2019 1006 Gross per 24 hour  Intake 340 ml  Output 3975 ml  Net -3635 ml   Filed Weights   01/27/19 0500 02/02/19 0607 02/03/19 0628  Weight: 118.9 kg 131.2 kg 131.8 kg    Examination:  General exam: Appears calm and comfortable  Respiratory system: Clear to auscultation. Respiratory effort normal. Cardiovascular system: S1 & S2 heard, RRR. No JVD, murmurs, rubs, gallops or clicks. No pedal edema. Gastrointestinal system: Abdomen is nondistended, soft and nontender. No organomegaly or masses felt. Normal bowel sounds heard. Central nervous system: Alert and oriented. No focal neurological deficits. Extremities: Symmetric 5 x 5 power. Skin: Confluent erythema involving almost 50% of the right lower extremity and 25% of the skin of the left lower extremity just above the ankle with no open sores but several scabs.  It is warm but nontender. Psychiatry: Judgement and insight appear normal. Mood & affect appropriate.   Procedures:  None  Microbiology summarized: PHXTA-56 negative. MRSA PCR negative.  Sch Meds:  Scheduled Meds: . amLODipine  5 mg Oral Daily  . buPROPion  150 mg Oral QODAY  . chlorhexidine  15 mL Mouth Rinse BID  . docusate sodium  100 mg Oral BID  . fluticasone  1 spray Each Nare Daily  . folic acid  1 mg Oral Daily  . furosemide  20  mg Intravenous BID  . heparin injection (subcutaneous)  5,000 Units Subcutaneous Q8H  . losartan  50 mg Oral Daily  . mouth rinse  15 mL Mouth Rinse q12n4p  . metoprolol succinate  50 mg Oral Daily  . multivitamin with minerals  1 tablet Oral Daily  . pantoprazole  40 mg Oral Daily  . polyethylene glycol  17 g Oral Daily  . senna  1 tablet Oral BID  . thiamine  100 mg Oral Daily  . vitamin B-12  1,000 mcg Oral Daily   Continuous Infusions: . cefTRIAXone (ROCEPHIN)  IV 2 g (02/07/19 1002)   PRN Meds:.ondansetron **OR** ondansetron  (ZOFRAN) IV, oxyCODONE  Antimicrobials: Anti-infectives (From admission, onward)   Start     Dose/Rate Route Frequency Ordered Stop   02/06/19 1000  cefTRIAXone (ROCEPHIN) 2 g in sodium chloride 0.9 % 100 mL IVPB     2 g 200 mL/hr over 30 Minutes Intravenous Every 24 hours 02/06/19 0845         I have personally reviewed the following labs and images: CBC: Recent Labs  Lab 02/03/19 0534 02/04/19 0709 02/05/19 0506 02/06/19 0523 02/07/19 0533  WBC 6.3 5.2 6.1 6.7 7.4  NEUTROABS 3.4 2.6 3.2 4.1 4.6  HGB 10.1* 9.7* 9.9* 10.2* 9.4*  HCT 33.2* 31.0* 31.7* 32.5* 30.2*  MCV 102.2* 98.4 99.7 99.7 100.7*  PLT 156 147* 159 162 164   BMP &GFR Recent Labs  Lab 02/01/19 0512 02/02/19 0554 02/03/19 0534 02/05/19 0506 02/06/19 0817 02/06/19 1120 02/07/19 0827  NA 138 135 136 135 134*  --  132*  K 3.7 4.0 3.6 3.2* 3.1*  --  3.5  CL 115* 112* 109 104 102  --  98  CO2 18* 18* 21* 25 25  --  24  GLUCOSE 123* 101* 100* 105* 103*  --  93  BUN '20 19 19 17 13  ' --  13  CREATININE 0.87 0.84 0.87 0.82 0.84  --  0.83  CALCIUM 7.6* 7.8* 8.1* 8.0* 7.9*  --  8.0*  MG 1.1* 1.5*  --  1.1*  --  1.4* 1.6*   Estimated Creatinine Clearance: 121.2 mL/min (by C-G formula based on SCr of 0.83 mg/dL). Liver & Pancreas: Recent Labs  Lab 02/01/19 0512 02/02/19 0554 02/03/19 0534 02/05/19 0506 02/07/19 0827  AST 297* 219* 195* 135* 94*  ALT 189* 164* 161* 124* 99*  ALKPHOS 181* 154* 178* 166* 159*  BILITOT 2.4* 2.5* 2.1* 1.9* 1.6*  PROT 5.5* 5.2* 5.8* 6.0* 6.2*  ALBUMIN 1.8* 1.9* 2.0* 2.3* 2.2*   No results for input(s): LIPASE, AMYLASE in the last 168 hours. No results for input(s): AMMONIA in the last 168 hours. Diabetic: No results for input(s): HGBA1C in the last 72 hours. Recent Labs  Lab 01/31/19 1650  GLUCAP 90   Cardiac Enzymes: Recent Labs  Lab 02/05/19 0506  CKTOTAL 355   No results for input(s): PROBNP in the last 8760 hours. Coagulation Profile: No results for  input(s): INR, PROTIME in the last 168 hours. Thyroid Function Tests: No results for input(s): TSH, T4TOTAL, FREET4, T3FREE, THYROIDAB in the last 72 hours. Lipid Profile: No results for input(s): CHOL, HDL, LDLCALC, TRIG, CHOLHDL, LDLDIRECT in the last 72 hours. Anemia Panel: No results for input(s): VITAMINB12, FOLATE, FERRITIN, TIBC, IRON, RETICCTPCT in the last 72 hours. Urine analysis:    Component Value Date/Time   COLORURINE YELLOW 01/26/2019 2242   APPEARANCEUR CLEAR 01/26/2019 2242   LABSPEC 1.008 01/26/2019 2242  PHURINE 6.0 01/26/2019 2242   GLUCOSEU NEGATIVE 01/26/2019 2242   HGBUR LARGE (A) 01/26/2019 2242   BILIRUBINUR NEGATIVE 01/26/2019 [place] 01/26/2019 2242   PROTEINUR 100 (A) 01/26/2019 2242   UROBILINOGEN 1.0 10/28/2013 1857   NITRITE NEGATIVE 01/26/2019 2242   LEUKOCYTESUR NEGATIVE 01/26/2019 2242   Sepsis Labs: Invalid input(s): PROCALCITONIN, Leon  Microbiology: Recent Results (from the past 240 hour(s))  SARS CORONAVIRUS 2 (TAT 6-24 HRS) Nasopharyngeal Nasopharyngeal Swab     Status: None   Collection Time: 02/05/19  2:11 PM   Specimen: Nasopharyngeal Swab  Result Value Ref Range Status   SARS Coronavirus 2 NEGATIVE NEGATIVE Final    Comment: (NOTE) SARS-CoV-2 target nucleic acids are NOT DETECTED. The SARS-CoV-2 RNA is generally detectable in upper and lower respiratory specimens during the acute phase of infection. Negative results do not preclude SARS-CoV-2 infection, do not rule out co-infections with other pathogens, and should not be used as the sole basis for treatment or other patient management decisions. Negative results must be combined with clinical observations, patient history, and epidemiological information. The expected result is Negative. Fact Sheet for Patients: SugarRoll.be Fact Sheet for Healthcare Providers: https://www.woods-mathews.com/ This test is not yet  approved or cleared by the [place] FDA and  has been authorized for detection and/or diagnosis of SARS-CoV-2 by FDA under an Emergency Use Authorization (EUA). This EUA will remain  in effect (meaning this test can be used) for the duration of the COVID-19 declaration under Section 56 4(b)(1) of the Act, 21 U.S.C. section 360bbb-3(b)(1), unless the authorization is terminated or revoked sooner. Performed at [facility], [place] [address] [place],  [address]     Radiology Studies: Vas [place] Lower Extremity Venous (dvt)  Result Date: 02/07/2019  Lower Venous Study Indications: Swelling, and Edema.  Anticoagulation: Heparin. Performing Technologist: Darlina Sicilian RDCS  Examination Guidelines: A complete evaluation includes B-mode imaging, spectral Doppler, color Doppler, and power Doppler as needed of all accessible portions of each vessel. Bilateral testing is considered an integral part of a complete examination. Limited examinations for reoccurring indications may be performed as noted.  +---------+---------------+---------+-----------+----------+--------------+ RIGHT    CompressibilityPhasicitySpontaneityPropertiesThrombus Aging +---------+---------------+---------+-----------+----------+--------------+ CFV      Full           Yes      Yes                                 +---------+---------------+---------+-----------+----------+--------------+ FV Prox  Full                                                        +---------+---------------+---------+-----------+----------+--------------+ FV Mid   Full                                                        +---------+---------------+---------+-----------+----------+--------------+ FV DistalFull                                                        +---------+---------------+---------+-----------+----------+--------------+  POP      Full           Yes      Yes                                  +---------+---------------+---------+-----------+----------+--------------+ PTV      Full                                                        +---------+---------------+---------+-----------+----------+--------------+ PERO     Full                                                        +---------+---------------+---------+-----------+----------+--------------+   +---------+---------------+---------+-----------+----------+--------------+ LEFT     CompressibilityPhasicitySpontaneityPropertiesThrombus Aging +---------+---------------+---------+-----------+----------+--------------+ CFV      Full           Yes      Yes                                 +---------+---------------+---------+-----------+----------+--------------+ FV Prox  Full                                                        +---------+---------------+---------+-----------+----------+--------------+ FV Mid   Full                                                        +---------+---------------+---------+-----------+----------+--------------+ FV DistalFull                                                        +---------+---------------+---------+-----------+----------+--------------+ POP      Full           Yes      Yes                                 +---------+---------------+---------+-----------+----------+--------------+ PTV      Full                                                        +---------+---------------+---------+-----------+----------+--------------+ PERO     Full                                                        +---------+---------------+---------+-----------+----------+--------------+  Summary: Right: No evidence of deep vein thrombosis in the lower extremity. No indirect evidence of obstruction proximal to the inguinal ligament. No cystic structure found in the popliteal fossa. Left: No evidence of deep vein thrombosis in the lower extremity. No  indirect evidence of obstruction proximal to the inguinal ligament. No cystic structure found in the popliteal fossa.  *See table(s) above for measurements and observations. Electronically signed by Curt Jews MD on 02/07/2019 at 7:48:49 AM.    Final     Darliss Cheney, MD [facility]  If 7PM-7AM, please contact night-coverage [email or web address] Password [email or web address] 02/07/2019, 10:15 AM

## 2019-02-08 DIAGNOSIS — W19XXXA Unspecified fall, initial encounter: Secondary | ICD-10-CM | POA: Diagnosis not present

## 2019-02-08 DIAGNOSIS — I2510 Atherosclerotic heart disease of native coronary artery without angina pectoris: Secondary | ICD-10-CM | POA: Diagnosis not present

## 2019-02-08 DIAGNOSIS — L03119 Cellulitis of unspecified part of limb: Secondary | ICD-10-CM | POA: Diagnosis not present

## 2019-02-08 DIAGNOSIS — R21 Rash and other nonspecific skin eruption: Secondary | ICD-10-CM | POA: Diagnosis not present

## 2019-02-08 DIAGNOSIS — M545 Low back pain: Secondary | ICD-10-CM | POA: Diagnosis not present

## 2019-02-08 DIAGNOSIS — M6281 Muscle weakness (generalized): Secondary | ICD-10-CM | POA: Diagnosis not present

## 2019-02-08 DIAGNOSIS — B179 Acute viral hepatitis, unspecified: Secondary | ICD-10-CM | POA: Diagnosis not present

## 2019-02-08 DIAGNOSIS — Z7401 Bed confinement status: Secondary | ICD-10-CM | POA: Diagnosis not present

## 2019-02-08 DIAGNOSIS — R600 Localized edema: Secondary | ICD-10-CM | POA: Diagnosis not present

## 2019-02-08 DIAGNOSIS — S32010D Wedge compression fracture of first lumbar vertebra, subsequent encounter for fracture with routine healing: Secondary | ICD-10-CM | POA: Diagnosis not present

## 2019-02-08 DIAGNOSIS — Y92009 Unspecified place in unspecified non-institutional (private) residence as the place of occurrence of the external cause: Secondary | ICD-10-CM | POA: Diagnosis not present

## 2019-02-08 DIAGNOSIS — I10 Essential (primary) hypertension: Secondary | ICD-10-CM

## 2019-02-08 DIAGNOSIS — M2550 Pain in unspecified joint: Secondary | ICD-10-CM | POA: Diagnosis not present

## 2019-02-08 DIAGNOSIS — K219 Gastro-esophageal reflux disease without esophagitis: Secondary | ICD-10-CM | POA: Diagnosis not present

## 2019-02-08 DIAGNOSIS — L299 Pruritus, unspecified: Secondary | ICD-10-CM | POA: Diagnosis not present

## 2019-02-08 DIAGNOSIS — W19XXXD Unspecified fall, subsequent encounter: Secondary | ICD-10-CM | POA: Diagnosis not present

## 2019-02-08 DIAGNOSIS — M6282 Rhabdomyolysis: Secondary | ICD-10-CM | POA: Diagnosis not present

## 2019-02-08 DIAGNOSIS — N179 Acute kidney failure, unspecified: Secondary | ICD-10-CM | POA: Diagnosis not present

## 2019-02-08 DIAGNOSIS — I469 Cardiac arrest, cause unspecified: Secondary | ICD-10-CM | POA: Diagnosis not present

## 2019-02-08 DIAGNOSIS — Z743 Need for continuous supervision: Secondary | ICD-10-CM | POA: Diagnosis not present

## 2019-02-08 LAB — BASIC METABOLIC PANEL
Anion gap: 9 (ref 5–15)
BUN: 11 mg/dL (ref 8–23)
CO2: 26 mmol/L (ref 22–32)
Calcium: 8.2 mg/dL — ABNORMAL LOW (ref 8.9–10.3)
Chloride: 99 mmol/L (ref 98–111)
Creatinine, Ser: 0.79 mg/dL (ref 0.61–1.24)
GFR calc Af Amer: >60 mL/min (ref >60)
GFR calc non Af Amer: >60 mL/min (ref >60)
Glucose, Bld: 103 mg/dL — ABNORMAL HIGH (ref 70–99)
Potassium: 3.2 mmol/L — ABNORMAL LOW (ref 3.5–5.1)
Sodium: 134 mmol/L — ABNORMAL LOW (ref 135–145)

## 2019-02-08 LAB — CBC WITH DIFFERENTIAL/PLATELET
Abs Immature Granulocytes: 0.02 10*3/uL (ref 0.00–0.07)
Basophils Absolute: 0.1 10*3/uL (ref 0.0–0.1)
Basophils Relative: 1 %
Eosinophils Absolute: 0.4 10*3/uL (ref 0.0–0.5)
Eosinophils Relative: 7 %
HCT: 30.1 % — ABNORMAL LOW (ref 39.0–52.0)
Hemoglobin: 9.4 g/dL — ABNORMAL LOW (ref 13.0–17.0)
Immature Granulocytes: 0 %
Lymphocytes Relative: 23 %
Lymphs Abs: 1.4 10*3/uL (ref 0.7–4.0)
MCH: 31.3 pg (ref 26.0–34.0)
MCHC: 31.2 g/dL (ref 30.0–36.0)
MCV: 100.3 fL — ABNORMAL HIGH (ref 80.0–100.0)
Monocytes Absolute: 0.8 10*3/uL (ref 0.1–1.0)
Monocytes Relative: 13 %
Neutro Abs: 3.4 10*3/uL (ref 1.7–7.7)
Neutrophils Relative %: 56 %
Platelets: 166 10*3/uL (ref 150–400)
RBC: 3 MIL/uL — ABNORMAL LOW (ref 4.22–5.81)
RDW: 22.2 % — ABNORMAL HIGH (ref 11.5–15.5)
WBC: 6.1 10*3/uL (ref 4.0–10.5)
nRBC: 0 % (ref 0.0–0.2)

## 2019-02-08 LAB — MAGNESIUM: Magnesium: 1.4 mg/dL — ABNORMAL LOW (ref 1.7–2.4)

## 2019-02-08 MED ORDER — CEPHALEXIN 500 MG PO CAPS: 500.0000 mg | ORAL_CAPSULE | Freq: Four times a day (QID) | ORAL | 0 refills | Status: AC

## 2019-02-08 MED ORDER — POTASSIUM CHLORIDE CRYS ER 20 MEQ PO TBCR
40.0000 meq | EXTENDED_RELEASE_TABLET | ORAL | Status: DC
  Administered 2019-02-08: 40 meq via ORAL
  Filled 2019-02-08: qty 2

## 2019-02-08 MED ORDER — MG-PLUS PROTEIN 133 MG PO TABS: 1.0000 | ORAL_TABLET | Freq: Two times a day (BID) | ORAL | 0 refills | Status: AC

## 2019-02-08 MED ORDER — AMLODIPINE BESYLATE 5 MG PO TABS: 5.0000 mg | ORAL_TABLET | Freq: Every day | ORAL | 0 refills | Status: DC

## 2019-02-08 MED ORDER — MAGNESIUM SULFATE 4 GM/100ML IV SOLN
4.0000 g | Freq: Once | INTRAVENOUS | Status: AC
  Administered 2019-02-08: 4 g via INTRAVENOUS
  Filled 2019-02-08: qty 100

## 2019-02-08 MED ORDER — FUROSEMIDE 20 MG PO TABS: 20.0000 mg | ORAL_TABLET | Freq: Every day | ORAL | 0 refills | Status: DC

## 2019-02-08 NOTE — Progress Notes (Signed)
[place] care to give report to the nurse, I was put on hold for 10 mins. Tried again couldn't get anyone to talk to.

## 2019-02-08 NOTE — Care Management Important Message (Deleted)
Important Message  Patient Details IM Letter given to Dessa Phi RN to present to the Patient Name: Gregory Hansen MRN: [id number] Date of Birth: 07-01-1950   Medicare Important Message Given:  Yes     [name] 02/08/2019, 9:19 AM

## 2019-02-08 NOTE — Discharge Summary (Signed)
Physician Discharge Summary  Gregory Hansen [id number] DOB: June 27, 1950 DOA: 01/26/2019  PCP: [name], DO  Admit date: 01/26/2019 Discharge date: 02/08/2019  Admitted From: Home Disposition: SNF  Recommendations for Outpatient Follow-up:  1. Follow up with PCP in 1-2 weeks 2. Please obtain BMP/CBC in one week 3. Please obtain magnesium and potassium in 1 to 2 days. 4. Please follow up on the following pending results:  Home Health: None Equipment/Devices: None  Discharge Condition: Stable CODE STATUS: Full code Diet recommendation: Cardiac  Subjective: Seen and examined.  Feels much better.  No complaints.  Brief/Interim Summary: 68 year old male with history of CAD/MI s/p CABG x4 in 2015, ankylosing spondylitis, HTN, DM-2, alcohol use disorder, depression, GERD and peripheral neuropathy brought to ED by EMS after found down at home by [employer].  Reportedly felt weak and lightheaded and fell walking up the steps at home and could not get up for about 30 minutes until the [employer] found him down.  Denies hitting his head or loss of consciousness.  Reportedly had lower extremity weakness, muscle pains and fatigue for sometimes.  Nausea but no emesis.  Endorses 3-4 shots of bourbon per day.  No history of DT or alcohol withdrawal seizure.  Denies smoking cigarettes or drug use.  Denies Tylenol.  In ED, afebrile and HDS.  Hgb 10.6.  Platelets 61.  Sodium 128.  CO2 17.  K3.4.  Mg 1.5.  Cr 2.92.  BUN 39.  AST 1632.  ALT 374.  ALP 246.  CK 33,000.  LDH 1200.  High-sensitivity troponin 26.  EKG NSR with nonspecific IVCD but no acute ischemic finding.  CT lumbar spine revealed acute burst fracture of L1 without body height loss of spinal cord involvement.  Neurosurgery consulted and recommended TLSO and outpatient follow-up in 2 weeks.  Started on aggressive IV fluids hydration and electrolyte replacement and admitted for fall, rhabdo, AKI, elevated liver enzymes/acute  hepatitis, hyponatremia and lumbar fracture.  Patient's acute hepatitis was thought to be likely secondary to high-dose statin as he was on atorvastatin 80 mg so this was discontinued during this hospitalization.  He can be considered to resume low-dose statin down the road once his liver function test is completely normal which have improved significantly but are still slightly elevated with no symptoms.  Patient's AKI-rhabdomyolysis has resolved after IV hydration.  Patient's blood pressure was slightly elevated so amlodipine 5 mg added to his previous home regimen.  Patient was ready to be discharged on Saturday, 2 days ago however he developed bilateral lower extremity cellulitis, right lower extremity worse than the left.  He remained afebrile.  He was started on Rocephin.  Over the past 2 days, he has improved significantly.  He also developed bilateral lower extremity edema few days ago due to aggressive IV hydration which was required to treat his AKI rhabdomyolysis.  He has been getting IV Lasix for last few days and his edema has improved and there is +1 pitting edema bilateral lower extremity currently.  Other than that, he has remained symptom-free.  It was recommended by PT that he should be transferred to CIR however his insurance denied that and he was approved for SNF.  He is a stable today so he is going to be discharged to skilled nursing facility.  He will be prescribed 8 more days of oral Keflex for bilateral lower extremity cellulitis.  Added medication for his hypertension which is amlodipine 5 mg p.o. daily.  We discontinued his atorvastatin.  He also had a persistent hypokalemia and hypomagnesemia which was replaced as needed.  He is being discharged on magnesium oral pills.  I recommend checking his magnesium and potassium closely.  I am also sending him on Lasix 20 mg p.o. daily for 15 days.  He will be discharged with TLSO brace and needs to follow-up with neurosurgery in 2  weeks.  Discharge Diagnoses:  Active Problems:   Essential hypertension   AKI (acute kidney injury) ([place])   Acute hepatitis   Rhabdomyolysis   Fall at home, initial encounter   Bilateral lower extremity edema   Lower extremity cellulitis    Discharge Instructions  Discharge Instructions    Discharge patient   Complete by: As directed    Discharge disposition: 03-Skilled [place]   Discharge patient date: 02/08/2019     Allergies as of 02/08/2019   No Known Allergies     Medication List    STOP taking these medications   atorvastatin 80 MG tablet Commonly known as: LIPITOR     TAKE these medications   amLODipine 5 MG tablet Commonly known as: NORVASC Take 1 tablet (5 mg total) by mouth daily.   aspirin 81 MG tablet Take 81 mg by mouth every evening.   buPROPion 300 MG 24 hr tablet Commonly known as: WELLBUTRIN XL Take 300 mg by mouth daily.   cephALEXin 500 MG capsule Commonly known as: KEFLEX Take 1 capsule (500 mg total) by mouth 4 (four) times daily for 8 days.   clotrimazole 1 % cream Commonly known as: LOTRIMIN Apply 1 application topically 2 (two) times daily.   fluticasone 50 MCG/ACT nasal spray Commonly known as: FLONASE Place 2 sprays into both nostrils every evening.   furosemide 20 MG tablet Commonly known as: Lasix Take 1 tablet (20 mg total) by mouth daily for 15 days.   loratadine 10 MG tablet Commonly known as: CLARITIN Take 10 mg by mouth daily.   losartan 50 MG tablet Commonly known as: COZAAR TAKE 1 TABLET BY MOUTH AT  BEDTIME   magnesium (amino acid chelate) 133 MG tablet Take 1 tablet by mouth 2 (two) times daily for 15 days.   metoprolol succinate 50 MG 24 hr tablet Commonly known as: TOPROL-XL TAKE 1 TABLET BY MOUTH  DAILY   nefazodone 200 MG tablet Commonly known as: SERZONE Take 200 mg by mouth 2 (two) times daily.   nitroGLYCERIN 0.4 MG SL tablet Commonly known as: NITROSTAT Place 0.4 mg under the  tongue every 5 (five) minutes as needed for chest pain.   pantoprazole 40 MG tablet Commonly known as: PROTONIX TAKE 1 TABLET BY MOUTH  DAILY   triamcinolone cream 0.1 % Commonly known as: KENALOG Apply 1 application topically daily.   vitamin B-12 1000 MCG tablet Commonly known as: CYANOCOBALAMIN Take 1,000 mcg by mouth daily.       Contact information for follow-up providers    [facility] Follow up.   [id number]       [email or web address] Follow up.   Why: For [place] meetings location       [name], DO Follow up in 1 week(s).   Specialty: Family Medicine Contact information: [id number] [place] [address] [phone]        [name], MD .   Specialty: Cardiology Contact information: [phone] [place] [address] [phone]            Contact information for  after-discharge care    Destination    [facility] Preferred SNF .   Service: Skilled Nursing Contact information: [address] [place] [phone]                 No Known Allergies  Consultations: None   Procedures/Studies: Ct Lumbar Spine Wo Contrast  Result Date: 01/26/2019 CLINICAL DATA:  Lumbosacral spine fracture, traumatic. Additional history provided: Patient fell on back from 2 steps up today. Low back pain. Generalized weakness. EXAM: CT LUMBAR SPINE WITHOUT CONTRAST TECHNIQUE: Multidetector CT imaging of the lumbar spine was performed without intravenous contrast administration. Multiplanar CT image reconstructions were also generated. COMPARISON:  CT abdomen/pelvis 11/22/2016. FINDINGS: Segmentation: 5 lumbar vertebrae. Alignment: Lumbar levocurvature. Straightening of the expected lumbar lordosis. No significant spondylolisthesis. Vertebrae: Acute predominantly horizontally oriented fracture of the L1 vertebral body involving both the anterior and middle  columns. Fracture lucencies extend through the posterior aspect of the vertebra (series 10, image 43). Fracture lucencies also extend to the superior endplate and into the left L1 pedicle (series 4, image 39). No significant bony retropulsion. There is little if any L1 vertebral body height loss. No other acute fracture identified. Redemonstrated mild chronic T12 superior endplate deformity with superimposed Schmorl node. Redemonstrated fusion of bilateral sacroiliac joints. Paraspinal and other soft tissues: Paraspinal soft tissues within normal limits. Aortoiliac atherosclerosis. Disc levels: No high-grade bony spinal canal or neural foraminal narrowing at any level. IMPRESSION: 1. Acute predominantly horizontally oriented fracture of the L1 vertebral body. Fracture lucencies extend to the L1 superior endplate, through the posterior margin of the vertebra and into the left L1 pedicle. There is little, if any, L1 vertebral body height loss. No significant bony retropulsion. 2. Redemonstrated mild chronic T12 superior endplate deformity. Electronically Signed   By: Kellie Simmering DO   On: 01/26/2019 17:36   US Abdomen Limited  Result Date: 01/31/2019 CLINICAL DATA:  History of ascites. EXAM: ULTRASOUND ABDOMEN LIMITED COMPARISON:  Liver ultrasound dated 01/26/2019 FINDINGS: Multiple views of the abdomen were obtained. No ascites is identified. IMPRESSION: No evidence of ascites. Electronically Signed   By: Zerita Boers M.D.   On: 01/31/2019 12:19   US Liver Doppler  Result Date: 01/27/2019 CLINICAL DATA:  Acute hepatitis. EXAM: DUPLEX ULTRASOUND OF LIVER TECHNIQUE: Color and duplex Doppler ultrasound was performed to evaluate the hepatic in-flow and out-flow vessels. Exam was limited due to bowel gas and body habitus. COMPARISON:  November 22, 2016. FINDINGS: Liver: Increased echogenicity of hepatic parenchyma is noted consistent with hepatic steatosis. Normal hepatic contour without nodularity. No focal  lesion, mass or intrahepatic biliary ductal dilatation. Main Portal Vein size: 1.2 cm Portal Vein Velocities Main Prox:  29.1 cm/sec Main Mid: 32.0 cm/sec Main Dist:  34.0 cm/sec Right: 27.8 cm/sec Left: 37.0 cm/sec Normal hepatopetal flow is noted in the portal veins. Hepatic Vein Velocities Right:  24.1 cm/sec Middle:  27.0 cm/sec Left:  16.7 cm/sec Normal hepatofugal flow is noted in the hepatic veins. IVC: Present and patent with normal respiratory phasicity. Hepatic Artery Velocity:  33.4 cm/sec Splenic Vein Velocity:  Not visualized. Spleen: 8.7 cm x 13.6 cm x 6.6 cm with a total volume of 412 cm^3 (411 cm^3 is upper limit normal) Portal Vein Occlusion/Thrombus: No Splenic Vein Occlusion/Thrombus: Splenic vein not visualized. Ascites: None Varices: None IMPRESSION: There is no definite evidence of hepatic or portal venous thrombosis or occlusion. The splenic vein was not visualized and therefore thrombosis or occlusion of this  structure cannot be excluded. Minimal splenomegaly is noted. Increased echogenicity of hepatic parenchyma is noted suggesting hepatic steatosis. Electronically Signed   By: Marijo Conception M.D.   On: 01/27/2019 07:07   Vas [place] Lower Extremity Venous (dvt)  Result Date: 02/07/2019  Lower Venous Study Indications: Swelling, and Edema.  Anticoagulation: Heparin. Performing Technologist: Darlina Sicilian RDCS  Examination Guidelines: A complete evaluation includes B-mode imaging, spectral Doppler, color Doppler, and power Doppler as needed of all accessible portions of each vessel. Bilateral testing is considered an integral part of a complete examination. Limited examinations for reoccurring indications may be performed as noted.  +---------+---------------+---------+-----------+----------+--------------+ RIGHT    CompressibilityPhasicitySpontaneityPropertiesThrombus Aging +---------+---------------+---------+-----------+----------+--------------+ CFV      Full           Yes       Yes                                 +---------+---------------+---------+-----------+----------+--------------+ FV Prox  Full                                                        +---------+---------------+---------+-----------+----------+--------------+ FV Mid   Full                                                        +---------+---------------+---------+-----------+----------+--------------+ FV DistalFull                                                        +---------+---------------+---------+-----------+----------+--------------+ POP      Full           Yes      Yes                                 +---------+---------------+---------+-----------+----------+--------------+ PTV      Full                                                        +---------+---------------+---------+-----------+----------+--------------+ PERO     Full                                                        +---------+---------------+---------+-----------+----------+--------------+   +---------+---------------+---------+-----------+----------+--------------+ LEFT     CompressibilityPhasicitySpontaneityPropertiesThrombus Aging +---------+---------------+---------+-----------+----------+--------------+ CFV      Full           Yes      Yes                                 +---------+---------------+---------+-----------+----------+--------------+  FV Prox  Full                                                        +---------+---------------+---------+-----------+----------+--------------+ FV Mid   Full                                                        +---------+---------------+---------+-----------+----------+--------------+ FV DistalFull                                                        +---------+---------------+---------+-----------+----------+--------------+ POP      Full           Yes      Yes                                  +---------+---------------+---------+-----------+----------+--------------+ PTV      Full                                                        +---------+---------------+---------+-----------+----------+--------------+ PERO     Full                                                        +---------+---------------+---------+-----------+----------+--------------+     Summary: Right: No evidence of deep vein thrombosis in the lower extremity. No indirect evidence of obstruction proximal to the inguinal ligament. No cystic structure found in the popliteal fossa. Left: No evidence of deep vein thrombosis in the lower extremity. No indirect evidence of obstruction proximal to the inguinal ligament. No cystic structure found in the popliteal fossa.  *See table(s) above for measurements and observations. Electronically signed by Curt Jews MD on 02/07/2019 at 7:48:49 AM.    Final      Discharge Exam: Vitals:   02/07/19 2233 02/08/19 0654  BP: (!) 147/75 (!) 143/80  Pulse: 82 82  Resp: 16 17  Temp: 98.1 F (36.7 C) 98.6 F (37 C)  SpO2: 99% 98%   Vitals:   02/07/19 0730 02/07/19 1441 02/07/19 2233 02/08/19 0654  BP: (!) 141/70 134/64 (!) 147/75 (!) 143/80  Pulse: 82 80 82 82  Resp: 17 16 16 17   Temp: 98.4 F (36.9 C) 98.4 F (36.9 C) 98.1 F (36.7 C) 98.6 F (37 C)  TempSrc: Oral Oral Oral Oral  SpO2: 98% 97% 99% 98%  Weight:      Height:        General: Pt is alert, awake, not in acute distress Cardiovascular: RRR, S1/S2 +, no rubs, no gallops Respiratory: CTA bilaterally, no wheezing, no rhonchi Abdominal: Soft, NT, ND, bowel sounds +  Extremities: +1 pitting edema bilateral lower extremities with erythema in right lower extremity and left lower extremity which is improving.  No tenderness or warmth.    The results of significant diagnostics from this hospitalization (including imaging, microbiology, ancillary and laboratory) are listed below for reference.      Microbiology: Recent Results (from the past 240 hour(s))  SARS CORONAVIRUS 2 (TAT 6-24 HRS) Nasopharyngeal Nasopharyngeal Swab     Status: None   Collection Time: 02/05/19  2:11 PM   Specimen: Nasopharyngeal Swab  Result Value Ref Range Status   SARS Coronavirus 2 NEGATIVE NEGATIVE Final    Comment: (NOTE) SARS-CoV-2 target nucleic acids are NOT DETECTED. The SARS-CoV-2 RNA is generally detectable in upper and lower respiratory specimens during the acute phase of infection. Negative results do not preclude SARS-CoV-2 infection, do not rule out co-infections with other pathogens, and should not be used as the sole basis for treatment or other patient management decisions. Negative results must be combined with clinical observations, patient history, and epidemiological information. The expected result is Negative. Fact Sheet for Patients: SugarRoll.be Fact Sheet for Healthcare Providers: https://www.woods-mathews.com/ This test is not yet approved or cleared by the [place] FDA and  has been authorized for detection and/or diagnosis of SARS-CoV-2 by FDA under an Emergency Use Authorization (EUA). This EUA will remain  in effect (meaning this test can be used) for the duration of the COVID-19 declaration under Section 56 4(b)(1) of the Act, 21 U.S.C. section 360bbb-3(b)(1), unless the authorization is terminated or revoked sooner. Performed at [facility], [place] [address] [place], [place] [address]      Labs: BNP (last 3 results) Recent Labs    01/26/19 2139  BNP [id number]*   Basic Metabolic Panel: Recent Labs  Lab 02/02/19 0554 02/03/19 0534 02/05/19 0506 02/06/19 0817 02/06/19 1120 02/07/19 0827 02/08/19 0501  NA 135 136 135 134*  --  132* 134*  K 4.0 3.6 3.2* 3.1*  --  3.5 3.2*  CL 112* 109 104 102  --  98 99  CO2 18* 21* 25 25  --  24 26  GLUCOSE 101* 100* 105* 103*  --  93 103*  BUN 19 19 17 13   --  13 11   CREATININE 0.84 0.87 0.82 0.84  --  0.83 0.79  CALCIUM 7.8* 8.1* 8.0* 7.9*  --  8.0* 8.2*  MG 1.5*  --  1.1*  --  1.4* 1.6* 1.4*   Liver Function Tests: Recent Labs  Lab 02/02/19 0554 02/03/19 0534 02/05/19 0506 02/07/19 0827  AST 219* 195* 135* 94*  ALT 164* 161* 124* 99*  ALKPHOS 154* 178* 166* 159*  BILITOT 2.5* 2.1* 1.9* 1.6*  PROT 5.2* 5.8* 6.0* 6.2*  ALBUMIN 1.9* 2.0* 2.3* 2.2*   No results for input(s): LIPASE, AMYLASE in the last 168 hours. No results for input(s): AMMONIA in the last 168 hours. CBC: Recent Labs  Lab 02/04/19 0709 02/05/19 0506 02/06/19 0523 02/07/19 0533 02/08/19 0502  WBC 5.2 6.1 6.7 7.4 6.1  NEUTROABS 2.6 3.2 4.1 4.6 3.4  HGB 9.7* 9.9* 10.2* 9.4* 9.4*  HCT 31.0* 31.7* 32.5* 30.2* 30.1*  MCV 98.4 99.7 99.7 100.7* 100.3*  PLT 147* 159 162 164 166   Cardiac Enzymes: Recent Labs  Lab 02/05/19 0506  CKTOTAL 355   BNP: Invalid input(s): POCBNP CBG: No results for input(s): GLUCAP in the last 168 hours. D-Dimer No results for input(s): DDIMER in the last 72 hours. Hgb A1c No results  for input(s): HGBA1C in the last 72 hours. Lipid Profile No results for input(s): CHOL, HDL, LDLCALC, TRIG, CHOLHDL, LDLDIRECT in the last 72 hours. Thyroid function studies No results for input(s): TSH, T4TOTAL, T3FREE, THYROIDAB in the last 72 hours.  Invalid input(s): FREET3 Anemia work up No results for input(s): VITAMINB12, FOLATE, FERRITIN, TIBC, IRON, RETICCTPCT in the last 72 hours. Urinalysis    Component Value Date/Time   COLORURINE YELLOW 01/26/2019 2242   APPEARANCEUR CLEAR 01/26/2019 2242   LABSPEC 1.008 01/26/2019 2242   PHURINE 6.0 01/26/2019 2242   GLUCOSEU NEGATIVE 01/26/2019 2242   HGBUR LARGE (A) 01/26/2019 2242   [place] NEGATIVE 01/26/2019 2242   KETONESUR NEGATIVE 01/26/2019 2242   PROTEINUR 100 (A) 01/26/2019 2242   UROBILINOGEN 1.0 10/28/2013 1857   NITRITE NEGATIVE 01/26/2019 2242   LEUKOCYTESUR NEGATIVE 01/26/2019  2242   Sepsis Labs Invalid input(s): PROCALCITONIN,  WBC,  LACTICIDVEN Microbiology Recent Results (from the past 240 hour(s))  SARS CORONAVIRUS 2 (TAT 6-24 HRS) Nasopharyngeal Nasopharyngeal Swab     Status: None   Collection Time: 02/05/19  2:11 PM   Specimen: Nasopharyngeal Swab  Result Value Ref Range Status   SARS Coronavirus 2 NEGATIVE NEGATIVE Final    Comment: (NOTE) SARS-CoV-2 target nucleic acids are NOT DETECTED. The SARS-CoV-2 RNA is generally detectable in upper and lower respiratory specimens during the acute phase of infection. Negative results do not preclude SARS-CoV-2 infection, do not rule out co-infections with other pathogens, and should not be used as the sole basis for treatment or other patient management decisions. Negative results must be combined with clinical observations, patient history, and epidemiological information. The expected result is Negative. Fact Sheet for Patients: SugarRoll.be Fact Sheet for Healthcare Providers: https://www.woods-mathews.com/ This test is not yet approved or cleared by the [place] FDA and  has been authorized for detection and/or diagnosis of SARS-CoV-2 by FDA under an Emergency Use Authorization (EUA). This EUA will remain  in effect (meaning this test can be used) for the duration of the COVID-19 declaration under Section 56 4(b)(1) of the Act, 21 U.S.C. section 360bbb-3(b)(1), unless the authorization is terminated or revoked sooner. Performed at [facility], [place] [address], [place], [place] [address]      Time coordinating discharge: Over 30 minutes  SIGNED:   Darliss Cheney, MD  [facility] 02/08/2019, 9:48 AM  If 7PM-7AM, please contact night-coverage [email or web address] Password [email or web address]

## 2019-02-08 NOTE — Discharge Instructions (Signed)
Rhabdomyolysis °Rhabdomyolysis is a condition that happens when muscle cells break down and release substances into the blood that can damage the kidneys. This condition happens because of damage to the muscles that move bones (skeletal muscle). When the skeletal muscles are damaged, substances inside the muscle cells go into the blood. One of these substances is a protein called myoglobin. °Large amounts of myoglobin can cause kidney damage or kidney failure. Other substances that are released by muscle cells may upset the balance of the minerals (electrolytes) in your blood. This imbalance causes your blood to have too much acid (acidosis). °What are the causes? °This condition is caused by muscle damage. Muscle damage often happens because of: °· Using your muscles too much. °· An injury that crushes or squeezes a muscle too tightly. °· Using illegal drugs, mainly cocaine. °· Alcohol abuse. °Other possible causes include: °· Prescription medicines, such as those that: °? Lower cholesterol (statins). °? Treat ADHD (attention deficit hyperactivity disorder) or help with weight loss (amphetamines). °? Treat pain (opiates). °· Infections. °· Muscle diseases that are passed down from parent to child (inherited). °· High fever. °· Heatstroke. °· Not having enough fluids in your body (dehydration). °· Seizures. °· Surgery. °What increases the risk? °This condition is more likely to develop in people who: °· Have a family history of muscle disease. °· Take part in extreme sports, such as running in marathons. °· Have diabetes. °· Are older. °· Abuse drugs or alcohol. °What are the signs or symptoms? °Symptoms of this condition vary. Some people have very few symptoms, and other people have many symptoms. The most common symptoms include: °· Muscle pain and swelling. °· Weak muscles. °· Dark urine. °· Feeling weak and tired. °Other symptoms include: °· Nausea and vomiting. °· Fever. °· Pain in the abdomen. °· Pain in the  joints. °Symptoms of complications from this condition include: °· Heart rhythm that is not normal (arrhythmia). °· Seizures. °· Not urinating enough because of kidney failure. °· Very low blood pressure (shock). Signs of shock include dizziness, blurry vision, and clammy skin. °· Bleeding that is hard to stop or control. °How is this diagnosed? °This condition is diagnosed based on your medical history, your symptoms, and a physical exam. Tests may also be done, including: °· Blood tests. °· Urine tests to check for myoglobin. °You may also have other tests to check for causes of muscle damage and to check for complications. °How is this treated? °Treatment for this condition helps to: °· Make sure you have enough fluids in your body. °· Lower the acid levels in your blood to reverse acidosis. °· Protect your kidneys. °Treatment may include: °· Fluids and medicines given through an IV tube that is inserted into one of your veins. °· Medicines to lower acidosis or to bring back the balance of the minerals in your body. °· Hemodialysis. This treatment uses an artificial kidney machine to filter your blood while you recover. You may have this if other treatments are not helping. °Follow these instructions at home: ° °· Take over-the-counter and prescription medicines only as told by your health care provider. °· Rest at home until your health care provider says that you can return to your normal activities. °· Drink enough fluid to keep your urine clear or pale yellow. °· Do not do activities that take a lot of effort (are strenuous). Ask your health care provider what level of exercise is safe for you. °· Do not abuse drugs or   alcohol. If you are having problems with drug or alcohol use, ask your health care provider for help.  Keep all follow-up visits as told by your health care provider. This is important. Contact a health care provider if:  You start having symptoms of this condition after treatment. Get  help right away if:  You have a seizure.  You bleed easily or cannot control bleeding.  You cannot urinate.  You have chest pain.  You have trouble breathing. This information is not intended to replace advice given to you by your health care provider. Make sure you discuss any questions you have with your health care provider. Document Released: 02/08/2004 Document Revised: 02/07/2017 Document Reviewed: 12/08/2015 Elsevier Patient Education  2020 [employer].

## 2019-02-08 NOTE — TOC Transition Note (Signed)
Transition of Care [facility]) - CM/SW Discharge Note   Patient Details  Name: Gregory Hansen MRN: [id number] Date of Birth: 1951/01/08  [facility]) CM/SW Contact:  Lynnell Catalan, RN Phone Number: 02/08/2019, 11:15 AM   Clinical Narrative:    Pt to dc to [facility] today. DC summary sent via hub. [facility] contacted for transport at 1230. RN to call [phone] for report and pt is going to room 102B

## 2019-02-10 DIAGNOSIS — L299 Pruritus, unspecified: Secondary | ICD-10-CM | POA: Diagnosis not present

## 2019-02-10 DIAGNOSIS — M6281 Muscle weakness (generalized): Secondary | ICD-10-CM | POA: Diagnosis not present

## 2019-02-10 DIAGNOSIS — L03119 Cellulitis of unspecified part of limb: Secondary | ICD-10-CM | POA: Diagnosis not present

## 2019-02-10 DIAGNOSIS — R21 Rash and other nonspecific skin eruption: Secondary | ICD-10-CM | POA: Diagnosis not present

## 2019-02-10 DIAGNOSIS — S32010D Wedge compression fracture of first lumbar vertebra, subsequent encounter for fracture with routine healing: Secondary | ICD-10-CM | POA: Diagnosis not present

## 2019-02-11 DIAGNOSIS — I10 Essential (primary) hypertension: Secondary | ICD-10-CM | POA: Diagnosis not present

## 2019-02-11 DIAGNOSIS — L03119 Cellulitis of unspecified part of limb: Secondary | ICD-10-CM | POA: Diagnosis not present

## 2019-02-16 DIAGNOSIS — I2510 Atherosclerotic heart disease of native coronary artery without angina pectoris: Secondary | ICD-10-CM | POA: Diagnosis not present

## 2019-02-16 DIAGNOSIS — M545 Low back pain: Secondary | ICD-10-CM | POA: Diagnosis not present

## 2019-02-16 DIAGNOSIS — L03119 Cellulitis of unspecified part of limb: Secondary | ICD-10-CM | POA: Diagnosis not present

## 2019-02-16 DIAGNOSIS — R600 Localized edema: Secondary | ICD-10-CM | POA: Diagnosis not present

## 2019-02-16 DIAGNOSIS — M6281 Muscle weakness (generalized): Secondary | ICD-10-CM | POA: Diagnosis not present

## 2019-02-19 DIAGNOSIS — I10 Essential (primary) hypertension: Secondary | ICD-10-CM | POA: Diagnosis not present

## 2019-02-19 DIAGNOSIS — L03119 Cellulitis of unspecified part of limb: Secondary | ICD-10-CM | POA: Diagnosis not present

## 2019-02-19 DIAGNOSIS — K219 Gastro-esophageal reflux disease without esophagitis: Secondary | ICD-10-CM | POA: Diagnosis not present

## 2019-02-19 DIAGNOSIS — S32010D Wedge compression fracture of first lumbar vertebra, subsequent encounter for fracture with routine healing: Secondary | ICD-10-CM | POA: Diagnosis not present

## 2019-02-22 ENCOUNTER — Ambulatory Visit: Payer: Medicare Other | Admitting: Cardiovascular Disease

## 2019-02-24 DIAGNOSIS — D649 Anemia, unspecified: Secondary | ICD-10-CM | POA: Diagnosis not present

## 2019-02-24 DIAGNOSIS — K219 Gastro-esophageal reflux disease without esophagitis: Secondary | ICD-10-CM | POA: Diagnosis not present

## 2019-02-24 DIAGNOSIS — Z7982 Long term (current) use of aspirin: Secondary | ICD-10-CM | POA: Diagnosis not present

## 2019-02-24 DIAGNOSIS — M459 Ankylosing spondylitis of unspecified sites in spine: Secondary | ICD-10-CM | POA: Diagnosis not present

## 2019-02-24 DIAGNOSIS — S32010D Wedge compression fracture of first lumbar vertebra, subsequent encounter for fracture with routine healing: Secondary | ICD-10-CM | POA: Diagnosis not present

## 2019-02-24 DIAGNOSIS — Z9181 History of falling: Secondary | ICD-10-CM | POA: Diagnosis not present

## 2019-02-24 DIAGNOSIS — E1142 Type 2 diabetes mellitus with diabetic polyneuropathy: Secondary | ICD-10-CM | POA: Diagnosis not present

## 2019-02-24 DIAGNOSIS — I10 Essential (primary) hypertension: Secondary | ICD-10-CM | POA: Diagnosis not present

## 2019-02-24 DIAGNOSIS — Z7951 Long term (current) use of inhaled steroids: Secondary | ICD-10-CM | POA: Diagnosis not present

## 2019-02-24 DIAGNOSIS — I25119 Atherosclerotic heart disease of native coronary artery with unspecified angina pectoris: Secondary | ICD-10-CM | POA: Diagnosis not present

## 2019-02-24 DIAGNOSIS — Z951 Presence of aortocoronary bypass graft: Secondary | ICD-10-CM | POA: Diagnosis not present

## 2019-02-25 ENCOUNTER — Encounter: Payer: Self-pay | Admitting: Family Medicine

## 2019-02-25 ENCOUNTER — Other Ambulatory Visit: Payer: Self-pay

## 2019-02-25 ENCOUNTER — Ambulatory Visit (INDEPENDENT_AMBULATORY_CARE_PROVIDER_SITE_OTHER): Payer: Medicare Other | Admitting: Family Medicine

## 2019-02-25 ENCOUNTER — Telehealth: Payer: Self-pay | Admitting: Family Medicine

## 2019-02-25 VITALS — BP 104/60 | Wt 241.1 lb

## 2019-02-25 DIAGNOSIS — E785 Hyperlipidemia, unspecified: Secondary | ICD-10-CM

## 2019-02-25 DIAGNOSIS — L03115 Cellulitis of right lower limb: Secondary | ICD-10-CM | POA: Diagnosis not present

## 2019-02-25 DIAGNOSIS — B179 Acute viral hepatitis, unspecified: Secondary | ICD-10-CM | POA: Diagnosis not present

## 2019-02-25 DIAGNOSIS — I10 Essential (primary) hypertension: Secondary | ICD-10-CM

## 2019-02-25 MED ORDER — ROSUVASTATIN CALCIUM 20 MG PO TABS: 20.0000 mg | ORAL_TABLET | Freq: Every day | ORAL | 3 refills | Status: DC

## 2019-02-25 MED ORDER — TRAMADOL HCL 50 MG PO TABS: 50.0000 mg | ORAL_TABLET | Freq: Four times a day (QID) | ORAL | 0 refills | Status: DC | PRN

## 2019-02-25 NOTE — Telephone Encounter (Signed)
Spoke with [facility], verbal orders given.

## 2019-02-25 NOTE — Telephone Encounter (Signed)
Jennie from [facility] called to get verbal orders for this patient. She is wanting orders for home health PT  2 x 1 1 x 1 2 x 4   She said that we could leave a voicemail since her phone is secure and hippa compliant.  Gregory Hansen [phone]

## 2019-02-25 NOTE — Telephone Encounter (Signed)
[facility] called back to let us know there is a warning for drug interaction (level 2 ) of Flonase with Mefazodone.  If you have any questions, please call Sonia Baller at [phone].

## 2019-02-25 NOTE — Progress Notes (Signed)
  Subjective:   Patient ID: Gregory Hansen    DOB: 1950-04-30, 68 y.o. male   MRN: [id number]  Gregory Hansen is a 68 y.o. male with a history of HTN, CAD s/p CABG x4, GERD, H/o Cryptosporidium diarrhea, ankylosing spondylitis here for   Hospital follow-up -Admitted 11/17-11/30 at [facility] for rhabdomyolysis after being found down s/p fall.  Sustained acute burst fracture of L1, neurosurgery consulted and recommended TLSO brace and outpatient follow-up in 2 weeks.  D/ced to SNF and discharged tot home on Sunday from SNF. Has not yet seen Neurosurgery for f/u. Will be getting HHPT. Using oxycodone every 5-7 hours for pain control, asks for refill. Also using muscle relaxer as needed.  HLD - Also with elevated LFTs during hospitalization and statin thus d/ced. Patient reports prior muscle cramps have ceased since stopping atorvastatin. Had previously decreased dose from 80 to 40mg .  HTN - BP in 160s SBP during admission, restarted amlodipine. Patient reports occasional dizziness and SBPs in low 100s with HHRN.  LE cellulitis - noted during hospitalization, finished outpatient Keflex course. Also finished 15 day course of lasix. Denies further swelling, pain.   Review of Systems:  Per HPI.  Medications and smoking status reviewed.  Objective:   BP 104/60   Wt 241 lb 2 oz (109.4 kg)   BMI 30.96 kg/m  Vitals and nursing note reviewed.  General: obese male, in no acute distress with non-toxic appearance CV: regular rate and rhythm without murmurs, rubs, or gallops, no lower extremity edema.  Lungs: clear to auscultation bilaterally with normal work of breathing Skin: warm, dry. Chronic venous stasis changes to b/l LE. Extremities: warm and well perfused, normal tone MSK: Gait normal, no appreciable hypertonicity of paravertebral musculature without tenderness to palpation. Neuro: Alert and oriented, speech normal  Assessment & Plan:   Hospital follow up Continue TLSO brace until follows up  with Neurosurgery, contact info provided. Will recheck LFTs, Cr from AKI sustained from rhabdomyolysis. Cellulitis resolved. Will be getting HHPT. Will trial short course of prn tramadol for pain relief.  Essential hypertension SBP low 100s with sx of hypotension. Will d/c amlodipine to prevent further falls. F/u in one month.  Hyperlipidemia Needs high intensity statin for secondary prevention. Will reinitiate different statin in hopes of better tolerability with muscle cramps. Will obtain repeat lipid panel and CMP today.  Cellulitis Resolved s/p keflex treatment.   Acute hepatitis Noted during hospitalization. Will recheck CMP today.  Orders Placed This Encounter  Procedures  . CMP (comprehensive metabolic panel)    Order Specific Question:   Has the patient fasted?    Answer:   No  . Magnesium  . Lipid Panel    Order Specific Question:   Has the patient fasted?    Answer:   No   Meds ordered this encounter  Medications  . rosuvastatin (CRESTOR) 20 MG tablet    Sig: Take 1 tablet (20 mg total) by mouth daily.    Dispense:  90 tablet    Refill:  3  . traMADol (ULTRAM) 50 MG tablet    Sig: Take 1 tablet (50 mg total) by mouth every 6 (six) hours as needed for severe pain.    Dispense:  30 tablet    Refill:  0    [name], DO PGY-3, [facility] 02/26/2019 10:55 AM

## 2019-02-25 NOTE — Telephone Encounter (Signed)
Will forward to MD. Gregory Hansen,CMA  

## 2019-02-25 NOTE — Patient Instructions (Addendum)
It was great to see you!  Our plans for today:  - Stop the amlodipine.  - We are starting a new statin. Let me know if your muscle cramps come back.  - Call [facility] for a follow up appointment at [phone]. - You will likely continue to have pain until your back fully heals. Let me know if the new pain medication allows you not to move around as well.  - Come back in one month for recheck of your blood pressure.  We are checking some labs today, we will call you or send you a letter if they are abnormal.   Take care and seek immediate care sooner if you develop any concerns.  Dr. [facility]

## 2019-02-25 NOTE — Telephone Encounter (Signed)
Ok to continue both

## 2019-02-25 NOTE — Telephone Encounter (Signed)
Will forward to MD to give ok for verbal orders.  Tyrique Sporn,CMA

## 2019-02-26 ENCOUNTER — Encounter: Payer: Self-pay | Admitting: Family Medicine

## 2019-02-26 LAB — LIPID PANEL
Chol/HDL Ratio: 4.8 ratio (ref 0.0–5.0)
Cholesterol, Total: 149 mg/dL (ref 100–199)
HDL: 31 mg/dL — ABNORMAL LOW (ref >39)
LDL Chol Calc (NIH): 101 mg/dL — ABNORMAL HIGH (ref 0–99)
Triglycerides: 90 mg/dL (ref 0–149)
VLDL Cholesterol Cal: 17 mg/dL (ref 5–40)

## 2019-02-26 LAB — COMPREHENSIVE METABOLIC PANEL
ALT: 25 IU/L (ref 0–44)
AST: 30 IU/L (ref 0–40)
Albumin/Globulin Ratio: 0.9 — ABNORMAL LOW (ref 1.2–2.2)
Albumin: 3.4 g/dL — ABNORMAL LOW (ref 3.8–4.8)
Alkaline Phosphatase: 141 IU/L — ABNORMAL HIGH (ref 39–117)
BUN/Creatinine Ratio: 13 (ref 10–24)
BUN: 12 mg/dL (ref 8–27)
Bilirubin Total: 0.9 mg/dL (ref 0.0–1.2)
CO2: 21 mmol/L (ref 20–29)
Calcium: 8.8 mg/dL (ref 8.6–10.2)
Chloride: 101 mmol/L (ref 96–106)
Creatinine, Ser: 0.92 mg/dL (ref 0.76–1.27)
GFR calc Af Amer: 98 mL/min/{1.73_m2} (ref >59)
GFR calc non Af Amer: 85 mL/min/{1.73_m2} (ref >59)
Globulin, Total: 3.6 g/dL (ref 1.5–4.5)
Glucose: 77 mg/dL (ref 65–99)
Potassium: 3.8 mmol/L (ref 3.5–5.2)
Sodium: 133 mmol/L — ABNORMAL LOW (ref 134–144)
Total Protein: 7 g/dL (ref 6.0–8.5)

## 2019-02-26 LAB — MAGNESIUM: Magnesium: 1.4 mg/dL — ABNORMAL LOW (ref 1.6–2.3)

## 2019-02-26 NOTE — Assessment & Plan Note (Addendum)
SBP low 100s with sx of hypotension. Will d/c amlodipine to prevent further falls. F/u in one month.

## 2019-02-26 NOTE — Assessment & Plan Note (Signed)
Noted during hospitalization. Will recheck CMP today.

## 2019-02-26 NOTE — Assessment & Plan Note (Signed)
Resolved s/p keflex treatment.

## 2019-02-26 NOTE — Assessment & Plan Note (Addendum)
Needs high intensity statin for secondary prevention. Will reinitiate different statin in hopes of better tolerability with muscle cramps. Will obtain repeat lipid panel and CMP today.

## 2019-03-01 DIAGNOSIS — E1142 Type 2 diabetes mellitus with diabetic polyneuropathy: Secondary | ICD-10-CM | POA: Diagnosis not present

## 2019-03-01 DIAGNOSIS — Z7951 Long term (current) use of inhaled steroids: Secondary | ICD-10-CM | POA: Diagnosis not present

## 2019-03-01 DIAGNOSIS — I10 Essential (primary) hypertension: Secondary | ICD-10-CM | POA: Diagnosis not present

## 2019-03-01 DIAGNOSIS — S32010D Wedge compression fracture of first lumbar vertebra, subsequent encounter for fracture with routine healing: Secondary | ICD-10-CM | POA: Diagnosis not present

## 2019-03-01 DIAGNOSIS — K219 Gastro-esophageal reflux disease without esophagitis: Secondary | ICD-10-CM | POA: Diagnosis not present

## 2019-03-01 DIAGNOSIS — Z7982 Long term (current) use of aspirin: Secondary | ICD-10-CM | POA: Diagnosis not present

## 2019-03-01 DIAGNOSIS — M459 Ankylosing spondylitis of unspecified sites in spine: Secondary | ICD-10-CM | POA: Diagnosis not present

## 2019-03-01 DIAGNOSIS — D649 Anemia, unspecified: Secondary | ICD-10-CM | POA: Diagnosis not present

## 2019-03-01 DIAGNOSIS — Z951 Presence of aortocoronary bypass graft: Secondary | ICD-10-CM | POA: Diagnosis not present

## 2019-03-01 DIAGNOSIS — Z9181 History of falling: Secondary | ICD-10-CM | POA: Diagnosis not present

## 2019-03-01 DIAGNOSIS — I25119 Atherosclerotic heart disease of native coronary artery with unspecified angina pectoris: Secondary | ICD-10-CM | POA: Diagnosis not present

## 2019-03-08 DIAGNOSIS — Z951 Presence of aortocoronary bypass graft: Secondary | ICD-10-CM | POA: Diagnosis not present

## 2019-03-08 DIAGNOSIS — I10 Essential (primary) hypertension: Secondary | ICD-10-CM | POA: Diagnosis not present

## 2019-03-08 DIAGNOSIS — K219 Gastro-esophageal reflux disease without esophagitis: Secondary | ICD-10-CM | POA: Diagnosis not present

## 2019-03-08 DIAGNOSIS — I25119 Atherosclerotic heart disease of native coronary artery with unspecified angina pectoris: Secondary | ICD-10-CM | POA: Diagnosis not present

## 2019-03-08 DIAGNOSIS — D649 Anemia, unspecified: Secondary | ICD-10-CM | POA: Diagnosis not present

## 2019-03-08 DIAGNOSIS — S32010D Wedge compression fracture of first lumbar vertebra, subsequent encounter for fracture with routine healing: Secondary | ICD-10-CM | POA: Diagnosis not present

## 2019-03-08 DIAGNOSIS — Z9181 History of falling: Secondary | ICD-10-CM | POA: Diagnosis not present

## 2019-03-08 DIAGNOSIS — Z7951 Long term (current) use of inhaled steroids: Secondary | ICD-10-CM | POA: Diagnosis not present

## 2019-03-08 DIAGNOSIS — E1142 Type 2 diabetes mellitus with diabetic polyneuropathy: Secondary | ICD-10-CM | POA: Diagnosis not present

## 2019-03-08 DIAGNOSIS — Z7982 Long term (current) use of aspirin: Secondary | ICD-10-CM | POA: Diagnosis not present

## 2019-03-08 DIAGNOSIS — M459 Ankylosing spondylitis of unspecified sites in spine: Secondary | ICD-10-CM | POA: Diagnosis not present

## 2019-03-10 DIAGNOSIS — Z9181 History of falling: Secondary | ICD-10-CM | POA: Diagnosis not present

## 2019-03-10 DIAGNOSIS — E1142 Type 2 diabetes mellitus with diabetic polyneuropathy: Secondary | ICD-10-CM | POA: Diagnosis not present

## 2019-03-10 DIAGNOSIS — I10 Essential (primary) hypertension: Secondary | ICD-10-CM | POA: Diagnosis not present

## 2019-03-10 DIAGNOSIS — S32010D Wedge compression fracture of first lumbar vertebra, subsequent encounter for fracture with routine healing: Secondary | ICD-10-CM | POA: Diagnosis not present

## 2019-03-10 DIAGNOSIS — Z951 Presence of aortocoronary bypass graft: Secondary | ICD-10-CM | POA: Diagnosis not present

## 2019-03-10 DIAGNOSIS — Z7982 Long term (current) use of aspirin: Secondary | ICD-10-CM | POA: Diagnosis not present

## 2019-03-10 DIAGNOSIS — I25119 Atherosclerotic heart disease of native coronary artery with unspecified angina pectoris: Secondary | ICD-10-CM | POA: Diagnosis not present

## 2019-03-10 DIAGNOSIS — K219 Gastro-esophageal reflux disease without esophagitis: Secondary | ICD-10-CM | POA: Diagnosis not present

## 2019-03-10 DIAGNOSIS — Z7951 Long term (current) use of inhaled steroids: Secondary | ICD-10-CM | POA: Diagnosis not present

## 2019-03-10 DIAGNOSIS — D649 Anemia, unspecified: Secondary | ICD-10-CM | POA: Diagnosis not present

## 2019-03-10 DIAGNOSIS — M459 Ankylosing spondylitis of unspecified sites in spine: Secondary | ICD-10-CM | POA: Diagnosis not present

## 2019-03-12 HISTORY — PX: COLONOSCOPY W/ POLYPECTOMY: SHX1380

## 2019-03-16 ENCOUNTER — Other Ambulatory Visit: Payer: Self-pay

## 2019-03-16 ENCOUNTER — Ambulatory Visit: Payer: Medicare Other | Admitting: Physician Assistant

## 2019-03-16 ENCOUNTER — Encounter: Payer: Self-pay | Admitting: Physician Assistant

## 2019-03-16 VITALS — BP 132/78 | HR 65 | Ht 74.0 in | Wt 223.0 lb

## 2019-03-16 DIAGNOSIS — E785 Hyperlipidemia, unspecified: Secondary | ICD-10-CM | POA: Diagnosis not present

## 2019-03-16 DIAGNOSIS — I10 Essential (primary) hypertension: Secondary | ICD-10-CM | POA: Diagnosis not present

## 2019-03-16 DIAGNOSIS — I2510 Atherosclerotic heart disease of native coronary artery without angina pectoris: Secondary | ICD-10-CM

## 2019-03-16 NOTE — Patient Instructions (Signed)
Medication Instructions:   *If you need a refill on your cardiac medications before your next appointment, please call your pharmacy*  Lab Work:  If you have labs (blood work) drawn today and your tests are completely normal, you will receive your results only by: Marland Kitchen MyChart Message (if you have MyChart) OR . A paper copy in the mail If you have any lab test that is abnormal or we need to change your treatment, we will call you to review the results.  Testing/Procedures: None ordered today.  Follow-Up: At [facility], you and your health needs are our priority.  As part of our continuing mission to provide you with exceptional heart care, we have created designated Provider Care Teams.  These Care Teams include your primary Cardiologist (physician) and Advanced Practice Providers (APPs -  Physician Assistants and Nurse Practitioners) who all work together to provide you with the care you need, when you need it.  Your next appointment:   1 year(s)  The format for your next appointment:   In Person  Provider:   You may see [name], MD or one of the following Advanced Practice Providers on your designated Care Team:    Richardson Dopp, PA-C  [place]in [place], [place]  Daune Perch, [place]

## 2019-03-16 NOTE — Progress Notes (Signed)
Cardiology Office Note:    Date:  03/16/2019   ID:  Gregory Hansen, DOB September 17, 1950, MRN [id number]  PCP:  [name], DO  Cardiologist:  [name], MD  Electrophysiologist:  None   Referring MD: [name], DO   Chief Complaint  Patient presents with  . Follow-up    CAD    History of Present Illness:    Gregory Hansen is a 69 y.o. male with:   Coronary artery disease   S/p multiple PCI procedures  S/p CABG in 2015  Hypertension   Hyperlipidemia   GERD   Anxiety   DJD  Neuropathy  Gregory Hansen was last seen in clinic by Daune Perch, NP in 04/2018.  He was admitted in November after suffering a fall resulting in an L1 burst fracture.  This was complicated by rhabdomyolysis, acute kidney injury and elevated LFTs.  He was discharged to skilled nursing.  Of note, his statin was held due to elevated LFTs.  He returns for follow up.  He has not had any chest pain, shortness of breath, syncope, orthopnea, or significant leg swelling.  He sees his orthopedist tomorrow to follow up on his L1 fracture. He is still having some lower back pain.  His PCP recently placed him back on statin Rx with Rosuvastatin.     Prior CV studies:   The following studies were reviewed today:   Pre-CABG Dopplers 10/26/2013 Bilateral ICA 1-39  Cardiac catheterization 10/25/13 Coronary angiography: Coronary dominance: right  Left mainstem: The left mainstem is heavily calcified. The vessel has 90-95% proximal stenosis, new from the previous study. There is irregularity to the plaque with evidence of ulceration.  Left anterior descending (LAD): The LAD is diffusely diseased. The proximal vessel at the first diagonal branch has 75% stenosis. The mid vessel at the origin of the second diagonal branch as 50-60% stenosis with diffuse disease involving the entire distal vessel.  Left circumflex (LCx): The left circumflex is patent. The mid circumflex was stented with no significant  in-stent restenosis. The OM branches are patent.  Right coronary artery (RCA): The right coronary cusp is severely calcified. The RCA is heavily stented and calcified throughout its course. The stented segment involving the ostium and proximal portion of the RCA is severely restenosed with diffuse 80-90% in-stent restenosis. The remaining portions of the RCA are patent with moderate mid vessel stenosis of 40-50%. The distal vessel has diffuse irregularity. The PDA and PLA branches are patent with mild diffuse irregularity.  Left ventriculography: Left ventricular systolic function is normal, LVEF is estimated at 55-65%, there is no significant mitral regurgitation   Contrast: 70 cc Omnipaque  Radiation dose/Fluoro time: 3.6 minutes  Estimated Blood Loss: minimal  Final Conclusions:   1. Severe left main disease 2. Severe proximal RCA stenosis (in-stent) 3. Severe proximal LAD stenosis 4. Patent LCx 5. Normal LV systolic function  Myoview 10/19/2013 High risk, fully reversible anterior and apical septal/apical lateral perfusion defect, EF 52   Past Medical History:  Diagnosis Date  . Anginal pain (Gregory Hansen)   . Anxiety   . Arthritis    SPINE   . Arthritis   . Coronary artery disease    s/p multiple percutaneous interventions, PCI 202 for DMI and DES distal RCA and CFX-OM 2006  . Coronary artery disease   . Depression   . GERD (gastroesophageal reflux disease)   . Hyperlipidemia    mixed  . Hyperlipidemia   . Hypertension   . MI (myocardial  infarction) ([place])   . Myocardial infarction ([place])   . Peripheral neuropathy    calves and both feet  . Peripheral neuropathy    calves and both feet  . Severe sepsis with acute organ dysfunction ([place]) 12/03/2016  . Spondylitis, ankylosing ([place])    Surgical Hx: The patient  has a past surgical history that includes Cardiac catheterization; Coronary angioplasty with stent; Coronary angioplasty with stent (03/26/2013); Finger surgery;  Coronary artery bypass graft (N/A, 10/29/2013); Intraoprative transesophageal echocardiogram (N/A, 10/29/2013); left heart catheterization with coronary angiogram (N/A, 10/01/2011); left heart catheterization with coronary angiogram (N/A, 03/26/2013); left heart catheterization with coronary angiogram (N/A, 10/25/2013); Coronary artery bypass graft; Coronary angioplasty with stent; Finger surgery; and Knee arthroscopy w/ laser (2003).   Current Medications: Current Meds  Medication Sig  . aspirin 81 MG tablet Take 81 mg by mouth every evening.   Marland Kitchen buPROPion (WELLBUTRIN XL) 300 MG 24 hr tablet Take 300 mg by mouth daily.  . clotrimazole (LOTRIMIN) 1 % cream Apply 1 application topically 2 (two) times daily.  . fluticasone (FLONASE) 50 MCG/ACT nasal spray Place 2 sprays into both nostrils every evening.   . loratadine (CLARITIN) 10 MG tablet Take 10 mg by mouth daily.  Marland Kitchen losartan (COZAAR) 50 MG tablet TAKE 1 TABLET BY MOUTH AT  BEDTIME  . metoprolol succinate (TOPROL-XL) 50 MG 24 hr tablet TAKE 1 TABLET BY MOUTH  DAILY  . nefazodone (SERZONE) 200 MG tablet Take 200 mg by mouth 2 (two) times daily.   . nitroGLYCERIN (NITROSTAT) 0.4 MG SL tablet Place 0.4 mg under the tongue every 5 (five) minutes as needed for chest pain.  . pantoprazole (PROTONIX) 40 MG tablet TAKE 1 TABLET BY MOUTH  DAILY  . rosuvastatin (CRESTOR) 20 MG tablet Take 1 tablet (20 mg total) by mouth daily.  . vitamin B-12 (CYANOCOBALAMIN) 1000 MCG tablet Take 1,000 mcg by mouth daily.     Allergies:   Patient has no known allergies.   Social History   Tobacco Use  . Smoking status: Former Smoker    Packs/day: 2.00    Years: 10.00    Pack years: 20.00    Types: Cigarettes    Quit date: 06/30/1979    Years since quitting: 39.7  . Smokeless tobacco: Never Used  . Tobacco comment: no plans to start back  Substance Use Topics  . Alcohol use: Yes    Alcohol/week: 16.0 standard drinks    Types: 14 Glasses of wine, 2 Shots of  liquor per week    Comment: 3-4 glasses of wine at dinner everyday.  . Drug use: No     Family Hx: The patient's family history includes Breast cancer (age of onset: 62) in his mother; Heart attack in his brother; Heart disease in his brother, father, maternal grandfather, and another family member; Leukemia in his maternal grandmother.  ROS:   Please see the history of present illness.    ROS All other systems reviewed and are negative.   EKGs/Labs/Other Test Reviewed:    EKG:  EKG is not ordered today.  The ekg performed on 01/26/2019 was personally reviewed and demonstrated normal sinus rhythm, HR 73, normal axis, IVCD, QTc 484, no change from prior tracing  Recent Labs: 01/26/2019: B Natriuretic Peptide 151.4 02/08/2019: Hemoglobin 9.4; Platelets 166 02/25/2019: ALT 25; BUN 12; Creatinine, Ser 0.92; Magnesium 1.4; Potassium 3.8; Sodium 133   Recent Lipid Panel Lab Results  Component Value Date/Time   CHOL 149 02/25/2019 04:46 PM  TRIG 90 02/25/2019 04:46 PM   HDL 31 (L) 02/25/2019 04:46 PM   CHOLHDL 4.8 02/25/2019 04:46 PM   CHOLHDL 2.8 05/25/2015 09:37 AM   LDLCALC 101 (H) 02/25/2019 04:46 PM   LDLDIRECT 89.7 08/01/2011 09:21 AM    Physical Exam:    VS:  BP 132/78   Pulse 65   Ht 6\' 2"  (1.88 m)   Wt 223 lb (101.2 kg)   SpO2 99%   BMI 28.63 kg/m     Wt Readings from Last 3 Encounters:  03/16/19 223 lb (101.2 kg)  02/25/19 241 lb 2 oz (109.4 kg)  02/03/19 290 lb 9.1 oz (131.8 kg)     Physical Exam  Constitutional: He is oriented to person, place, and time. He appears well-developed and well-nourished. No distress.  HENT:  Head: Normocephalic and atraumatic.  Eyes: No scleral icterus.  Neck: No thyromegaly present.  Cardiovascular: Normal rate, regular rhythm and normal heart sounds.  No murmur heard. Pulmonary/Chest: Effort normal and breath sounds normal. He has no rales.  Abdominal: Soft. There is no hepatomegaly.  Musculoskeletal:        General: No  edema.  Lymphadenopathy:    He has no cervical adenopathy.  Neurological: He is alert and oriented to person, place, and time.  Skin: Skin is warm and dry.  Psychiatric: He has a normal mood and affect.    ASSESSMENT & PLAN:    1. Coronary artery disease involving native coronary artery of native heart without angina pectoris Hx of multiple PCI procedures and eventual CABG in 2015.  He is overall doing well without angina symptoms.  Continue ASA, rosuvastatin.    2. Hyperlipidemia with target LDL less than 70 He was taken off of Atorvastatin when he was admitted in 01/2019 due to elevated LFTs.  His LFTs have returned to normal and his PCP recently placed him on Rosuvastatin.  Continue current Rx.   3. Essential hypertension The patient's blood pressure is controlled on his current regimen.  Continue current therapy.      Dispo:  Return in about 1 year (around 03/15/2020) for Routine Follow Up, w/ Dr. Burt Knack, or Richardson Dopp, PA-C, in person.   Medication Adjustments/Labs and Tests Ordered: Current medicines are reviewed at length with the patient today.  Concerns regarding medicines are outlined above.  Tests Ordered: No orders of the defined types were placed in this encounter.  Medication Changes: No orders of the defined types were placed in this encounter.   Signed, Richardson Dopp, PA-C  03/16/2019 4:14 PM    [facility] [place], [place], [place]  [address] Phone: [phone]; Fax: [phone]

## 2019-03-17 DIAGNOSIS — E1142 Type 2 diabetes mellitus with diabetic polyneuropathy: Secondary | ICD-10-CM | POA: Diagnosis not present

## 2019-03-17 DIAGNOSIS — S32010D Wedge compression fracture of first lumbar vertebra, subsequent encounter for fracture with routine healing: Secondary | ICD-10-CM | POA: Diagnosis not present

## 2019-03-17 DIAGNOSIS — Z951 Presence of aortocoronary bypass graft: Secondary | ICD-10-CM | POA: Diagnosis not present

## 2019-03-17 DIAGNOSIS — K219 Gastro-esophageal reflux disease without esophagitis: Secondary | ICD-10-CM | POA: Diagnosis not present

## 2019-03-17 DIAGNOSIS — I10 Essential (primary) hypertension: Secondary | ICD-10-CM | POA: Diagnosis not present

## 2019-03-17 DIAGNOSIS — Z7951 Long term (current) use of inhaled steroids: Secondary | ICD-10-CM | POA: Diagnosis not present

## 2019-03-17 DIAGNOSIS — S32018D Other fracture of first lumbar vertebra, subsequent encounter for fracture with routine healing: Secondary | ICD-10-CM | POA: Diagnosis not present

## 2019-03-17 DIAGNOSIS — I25119 Atherosclerotic heart disease of native coronary artery with unspecified angina pectoris: Secondary | ICD-10-CM | POA: Diagnosis not present

## 2019-03-17 DIAGNOSIS — M549 Dorsalgia, unspecified: Secondary | ICD-10-CM | POA: Diagnosis not present

## 2019-03-17 DIAGNOSIS — M459 Ankylosing spondylitis of unspecified sites in spine: Secondary | ICD-10-CM | POA: Diagnosis not present

## 2019-03-17 DIAGNOSIS — Z7982 Long term (current) use of aspirin: Secondary | ICD-10-CM | POA: Diagnosis not present

## 2019-03-17 DIAGNOSIS — Z9181 History of falling: Secondary | ICD-10-CM | POA: Diagnosis not present

## 2019-03-17 DIAGNOSIS — D649 Anemia, unspecified: Secondary | ICD-10-CM | POA: Diagnosis not present

## 2019-03-19 DIAGNOSIS — Z7982 Long term (current) use of aspirin: Secondary | ICD-10-CM | POA: Diagnosis not present

## 2019-03-19 DIAGNOSIS — I25119 Atherosclerotic heart disease of native coronary artery with unspecified angina pectoris: Secondary | ICD-10-CM | POA: Diagnosis not present

## 2019-03-19 DIAGNOSIS — Z9181 History of falling: Secondary | ICD-10-CM | POA: Diagnosis not present

## 2019-03-19 DIAGNOSIS — K219 Gastro-esophageal reflux disease without esophagitis: Secondary | ICD-10-CM | POA: Diagnosis not present

## 2019-03-19 DIAGNOSIS — S32010D Wedge compression fracture of first lumbar vertebra, subsequent encounter for fracture with routine healing: Secondary | ICD-10-CM | POA: Diagnosis not present

## 2019-03-19 DIAGNOSIS — Z951 Presence of aortocoronary bypass graft: Secondary | ICD-10-CM | POA: Diagnosis not present

## 2019-03-19 DIAGNOSIS — I10 Essential (primary) hypertension: Secondary | ICD-10-CM | POA: Diagnosis not present

## 2019-03-19 DIAGNOSIS — Z7951 Long term (current) use of inhaled steroids: Secondary | ICD-10-CM | POA: Diagnosis not present

## 2019-03-19 DIAGNOSIS — E1142 Type 2 diabetes mellitus with diabetic polyneuropathy: Secondary | ICD-10-CM | POA: Diagnosis not present

## 2019-03-19 DIAGNOSIS — D649 Anemia, unspecified: Secondary | ICD-10-CM | POA: Diagnosis not present

## 2019-03-19 DIAGNOSIS — M459 Ankylosing spondylitis of unspecified sites in spine: Secondary | ICD-10-CM | POA: Diagnosis not present

## 2019-03-23 ENCOUNTER — Encounter: Payer: Self-pay | Admitting: Family Medicine

## 2019-03-23 ENCOUNTER — Other Ambulatory Visit: Payer: Self-pay

## 2019-03-23 ENCOUNTER — Ambulatory Visit (INDEPENDENT_AMBULATORY_CARE_PROVIDER_SITE_OTHER): Payer: Medicare Other | Admitting: Family Medicine

## 2019-03-23 VITALS — BP 142/78 | HR 66 | Wt 227.8 lb

## 2019-03-23 DIAGNOSIS — I10 Essential (primary) hypertension: Secondary | ICD-10-CM

## 2019-03-23 DIAGNOSIS — F320 Major depressive disorder, single episode, mild: Secondary | ICD-10-CM

## 2019-03-23 DIAGNOSIS — E785 Hyperlipidemia, unspecified: Secondary | ICD-10-CM | POA: Diagnosis not present

## 2019-03-23 NOTE — Progress Notes (Signed)
  Subjective:   Patient ID: Gregory Hansen    DOB: [birth year], 69 y.o. male   MRN: [id number]  Gregory Hansen is a 69 y.o. male with a history of HTN, CAD s/p CABGx4, GERD, h/o cyrptosporidium diarrhea, ankylosing spondylitis, depression, HLD here for   Hypertension: - Medications: losartan 50mg  daily, metoprolol 50mg  daily (d/ced amlodipine at last visit) - Compliance: good - Checking BP at home: yes, 140s - Denies any SOB, CP, vision changes, LE edema, medication SEs, or symptoms of hypotension  HLD - started rosuvastatin 1 month ago. - tolerating well, no muscle cramps.   Depression - Now on viibryd. [place], counseling. Sees every 3 months. On wellbutrin also. Thinks it is due to back pain and not able to do his normal activities.   Thoracic fracture - Still having HH come out. Son lives with him. Daughter lives across the street. Has f/u with neurosurgery 2/10. No longer needing taking oxycodone, tramadol, muscle relaxer.   Review of Systems:  Per HPI.  Medications and smoking status reviewed.  Objective:   BP (!) 142/78   Pulse 66   Wt 227 lb 12.8 oz (103.3 kg)   SpO2 98%   BMI 29.25 kg/m  Vitals and nursing note reviewed.  General: overweight male, in no acute distress with non-toxic appearance CV: regular rate and rhythm without murmurs, rubs, or gallops, no lower extremity edema Lungs: clear to auscultation bilaterally with normal work of breathing Skin: warm, dry. Chronic stasis dermatitis to b/l LE  Depression screen [facility] 2/9 03/23/2019 03/23/2019 05/19/2018  Decreased Interest 1 1 0  Down, Depressed, Hopeless 1 1 0  PHQ - 2 Score 2 2 0  Altered sleeping 0 - -  Tired, decreased energy 1 - -  Change in appetite 0 - -  Feeling bad or failure about yourself  0 - -  Trouble concentrating 0 - -  Moving slowly or fidgety/restless 0 - -  Suicidal thoughts 0 - -  PHQ-9 Score 3 - -  Difficult doing work/chores Not difficult at all - -     Assessment & Plan:    Essential hypertension At goal for age. No changes made today.  Depression, major, single episode, mild ([place]) Follows closely with psych and counseling, doing well with medication change. PHQ9 3 today. No changes made.  Hyperlipidemia Tolerating statin, no changes made today.  No orders of the defined types were placed in this encounter.  No orders of the defined types were placed in this encounter.   [name], DO PGY-3, [facility] 03/23/2019 6:38 PM

## 2019-03-23 NOTE — Assessment & Plan Note (Signed)
Tolerating statin, no changes made today.

## 2019-03-23 NOTE — Assessment & Plan Note (Signed)
At goal for age. No changes made today.

## 2019-03-23 NOTE — Patient Instructions (Signed)
It was great to see you!  Our plans for today:  - No changes to your medications today. - Follow up in 3-6 months for your next check up or sooner as needed.   Take care and seek immediate care sooner if you develop any concerns.   Dr. [facility]  Things to do to keep yourself healthy  - Exercise at least 30-45 minutes a day, 3-4 days a week.  - Eat a low-fat diet with lots of fruits and vegetables, up to 7-9 servings per day.  - Seatbelts can save your life. Wear them always.  - Smoke detectors on every level of your home, check batteries every year.  - Eye Doctor - have an eye exam every 1-2 years  - Safe sex - if you may be exposed to STDs, use a condom.  - Alcohol -  If you drink, do it moderately, less than 2 drinks per day.  - [place]. Choose someone to speak for you if you are not able. https://www.prepareforyourcare.org is a great website to help you navigate this. - Depression is common in our stressful world.If you're feeling down or losing interest in things you normally enjoy, please come in for a visit.  - Violence - If anyone is threatening or hurting you, please call immediately.

## 2019-03-23 NOTE — Assessment & Plan Note (Addendum)
Follows closely with psych and counseling, doing well with medication change. PHQ9 3 today. No changes made.

## 2019-03-24 DIAGNOSIS — Z951 Presence of aortocoronary bypass graft: Secondary | ICD-10-CM | POA: Diagnosis not present

## 2019-03-24 DIAGNOSIS — K219 Gastro-esophageal reflux disease without esophagitis: Secondary | ICD-10-CM | POA: Diagnosis not present

## 2019-03-24 DIAGNOSIS — E1142 Type 2 diabetes mellitus with diabetic polyneuropathy: Secondary | ICD-10-CM | POA: Diagnosis not present

## 2019-03-24 DIAGNOSIS — Z7951 Long term (current) use of inhaled steroids: Secondary | ICD-10-CM | POA: Diagnosis not present

## 2019-03-24 DIAGNOSIS — M459 Ankylosing spondylitis of unspecified sites in spine: Secondary | ICD-10-CM | POA: Diagnosis not present

## 2019-03-24 DIAGNOSIS — Z9181 History of falling: Secondary | ICD-10-CM | POA: Diagnosis not present

## 2019-03-24 DIAGNOSIS — D649 Anemia, unspecified: Secondary | ICD-10-CM | POA: Diagnosis not present

## 2019-03-24 DIAGNOSIS — Z7982 Long term (current) use of aspirin: Secondary | ICD-10-CM | POA: Diagnosis not present

## 2019-03-24 DIAGNOSIS — I10 Essential (primary) hypertension: Secondary | ICD-10-CM | POA: Diagnosis not present

## 2019-03-24 DIAGNOSIS — I25119 Atherosclerotic heart disease of native coronary artery with unspecified angina pectoris: Secondary | ICD-10-CM | POA: Diagnosis not present

## 2019-03-24 DIAGNOSIS — S32010D Wedge compression fracture of first lumbar vertebra, subsequent encounter for fracture with routine healing: Secondary | ICD-10-CM | POA: Diagnosis not present

## 2019-03-25 DIAGNOSIS — S32010D Wedge compression fracture of first lumbar vertebra, subsequent encounter for fracture with routine healing: Secondary | ICD-10-CM | POA: Diagnosis not present

## 2019-03-25 DIAGNOSIS — Z7951 Long term (current) use of inhaled steroids: Secondary | ICD-10-CM | POA: Diagnosis not present

## 2019-03-25 DIAGNOSIS — I10 Essential (primary) hypertension: Secondary | ICD-10-CM | POA: Diagnosis not present

## 2019-03-25 DIAGNOSIS — K219 Gastro-esophageal reflux disease without esophagitis: Secondary | ICD-10-CM | POA: Diagnosis not present

## 2019-03-25 DIAGNOSIS — Z9181 History of falling: Secondary | ICD-10-CM | POA: Diagnosis not present

## 2019-03-25 DIAGNOSIS — Z7982 Long term (current) use of aspirin: Secondary | ICD-10-CM | POA: Diagnosis not present

## 2019-03-25 DIAGNOSIS — Z951 Presence of aortocoronary bypass graft: Secondary | ICD-10-CM | POA: Diagnosis not present

## 2019-03-25 DIAGNOSIS — M459 Ankylosing spondylitis of unspecified sites in spine: Secondary | ICD-10-CM | POA: Diagnosis not present

## 2019-03-25 DIAGNOSIS — D649 Anemia, unspecified: Secondary | ICD-10-CM | POA: Diagnosis not present

## 2019-03-25 DIAGNOSIS — I25119 Atherosclerotic heart disease of native coronary artery with unspecified angina pectoris: Secondary | ICD-10-CM | POA: Diagnosis not present

## 2019-03-25 DIAGNOSIS — E1142 Type 2 diabetes mellitus with diabetic polyneuropathy: Secondary | ICD-10-CM | POA: Diagnosis not present

## 2019-03-29 DIAGNOSIS — Z9181 History of falling: Secondary | ICD-10-CM | POA: Diagnosis not present

## 2019-03-29 DIAGNOSIS — I10 Essential (primary) hypertension: Secondary | ICD-10-CM | POA: Diagnosis not present

## 2019-03-29 DIAGNOSIS — Z951 Presence of aortocoronary bypass graft: Secondary | ICD-10-CM | POA: Diagnosis not present

## 2019-03-29 DIAGNOSIS — S32010D Wedge compression fracture of first lumbar vertebra, subsequent encounter for fracture with routine healing: Secondary | ICD-10-CM | POA: Diagnosis not present

## 2019-03-29 DIAGNOSIS — M459 Ankylosing spondylitis of unspecified sites in spine: Secondary | ICD-10-CM | POA: Diagnosis not present

## 2019-03-29 DIAGNOSIS — K219 Gastro-esophageal reflux disease without esophagitis: Secondary | ICD-10-CM | POA: Diagnosis not present

## 2019-03-29 DIAGNOSIS — D649 Anemia, unspecified: Secondary | ICD-10-CM | POA: Diagnosis not present

## 2019-03-29 DIAGNOSIS — Z7951 Long term (current) use of inhaled steroids: Secondary | ICD-10-CM | POA: Diagnosis not present

## 2019-03-29 DIAGNOSIS — I25119 Atherosclerotic heart disease of native coronary artery with unspecified angina pectoris: Secondary | ICD-10-CM | POA: Diagnosis not present

## 2019-03-29 DIAGNOSIS — E1142 Type 2 diabetes mellitus with diabetic polyneuropathy: Secondary | ICD-10-CM | POA: Diagnosis not present

## 2019-03-29 DIAGNOSIS — Z7982 Long term (current) use of aspirin: Secondary | ICD-10-CM | POA: Diagnosis not present

## 2019-03-30 ENCOUNTER — Ambulatory Visit: Payer: Medicare Other | Admitting: Family Medicine

## 2019-03-31 DIAGNOSIS — E1142 Type 2 diabetes mellitus with diabetic polyneuropathy: Secondary | ICD-10-CM | POA: Diagnosis not present

## 2019-03-31 DIAGNOSIS — Z7951 Long term (current) use of inhaled steroids: Secondary | ICD-10-CM | POA: Diagnosis not present

## 2019-03-31 DIAGNOSIS — Z9181 History of falling: Secondary | ICD-10-CM | POA: Diagnosis not present

## 2019-03-31 DIAGNOSIS — I25119 Atherosclerotic heart disease of native coronary artery with unspecified angina pectoris: Secondary | ICD-10-CM | POA: Diagnosis not present

## 2019-03-31 DIAGNOSIS — I10 Essential (primary) hypertension: Secondary | ICD-10-CM | POA: Diagnosis not present

## 2019-03-31 DIAGNOSIS — Z7982 Long term (current) use of aspirin: Secondary | ICD-10-CM | POA: Diagnosis not present

## 2019-03-31 DIAGNOSIS — D649 Anemia, unspecified: Secondary | ICD-10-CM | POA: Diagnosis not present

## 2019-03-31 DIAGNOSIS — M459 Ankylosing spondylitis of unspecified sites in spine: Secondary | ICD-10-CM | POA: Diagnosis not present

## 2019-03-31 DIAGNOSIS — S32010D Wedge compression fracture of first lumbar vertebra, subsequent encounter for fracture with routine healing: Secondary | ICD-10-CM | POA: Diagnosis not present

## 2019-03-31 DIAGNOSIS — Z951 Presence of aortocoronary bypass graft: Secondary | ICD-10-CM | POA: Diagnosis not present

## 2019-03-31 DIAGNOSIS — K219 Gastro-esophageal reflux disease without esophagitis: Secondary | ICD-10-CM | POA: Diagnosis not present

## 2019-04-21 DIAGNOSIS — S32018D Other fracture of first lumbar vertebra, subsequent encounter for fracture with routine healing: Secondary | ICD-10-CM | POA: Diagnosis not present

## 2019-05-15 ENCOUNTER — Other Ambulatory Visit: Payer: Self-pay | Admitting: Cardiovascular Disease

## 2019-05-31 ENCOUNTER — Other Ambulatory Visit: Payer: Self-pay | Admitting: Cardiovascular Disease

## 2019-07-01 ENCOUNTER — Encounter: Payer: Self-pay | Admitting: Family Medicine

## 2019-07-13 ENCOUNTER — Ambulatory Visit (INDEPENDENT_AMBULATORY_CARE_PROVIDER_SITE_OTHER): Payer: Medicare Other | Admitting: Family Medicine

## 2019-07-13 ENCOUNTER — Other Ambulatory Visit: Payer: Self-pay

## 2019-07-13 VITALS — BP 162/82 | HR 71 | Ht 74.0 in | Wt 242.4 lb

## 2019-07-13 DIAGNOSIS — M545 Low back pain: Secondary | ICD-10-CM

## 2019-07-13 DIAGNOSIS — M459 Ankylosing spondylitis of unspecified sites in spine: Secondary | ICD-10-CM

## 2019-07-13 DIAGNOSIS — R200 Anesthesia of skin: Secondary | ICD-10-CM | POA: Insufficient documentation

## 2019-07-13 DIAGNOSIS — R202 Paresthesia of skin: Secondary | ICD-10-CM

## 2019-07-13 DIAGNOSIS — G8929 Other chronic pain: Secondary | ICD-10-CM

## 2019-07-13 HISTORY — DX: Anesthesia of skin: R20.0

## 2019-07-13 MED ORDER — NAPROXEN 500 MG PO TABS: 500.0000 mg | ORAL_TABLET | Freq: Two times a day (BID) | ORAL | 0 refills | Status: DC

## 2019-07-13 NOTE — Patient Instructions (Signed)
Thank you for coming to see me today. It was a pleasure. Today we talked about:   I have ordered naproxen for your back pain.  Don't take ibuprofen with this.  Take this twice daily for two weeks.  I have placed a referral to Physical therapy for your back.  Call them at  [phone].  I have placed a referral to Rheumatology for your back pain.  If you do not hear from them in the next 2 weeks, please give [place] a call.  Please follow-up with me or your PCP in 4 weeks or sooner as needed.  If you have any questions or concerns, please do not hesitate to call the office at [phone].  Best,   [name], DO

## 2019-07-13 NOTE — Assessment & Plan Note (Signed)
Has a known history of this.  Suspect that this is currently the cause of his back pain.  Will refer to rheumatology, as he would not be a candidate for long-term NSAID use.  He may benefit from TNF alpha inhibitors in the future.  Would defer further imaging or blood tests to them.

## 2019-07-13 NOTE — Assessment & Plan Note (Signed)
Does not seem to have any red flag symptoms.  No evidence of cubital tunnel syndrome or carpal tunnel.  Symptoms seem to be very mild.  Do not seem to be in a distribution that would be coming from the T-spine.  Patient is neurovascularly intact on exam.  Could be secondary to overuse, does not seem to be bothering the patient very much.  We will continue to monitor.

## 2019-07-13 NOTE — Progress Notes (Signed)
SUBJECTIVE:   CHIEF COMPLAINT / HPI:    Lower Back Pain Onset: November 2020, fell and fractured L1, has not really gotten better since then Location/Radiation: very low back, also feels into right buttocks Duration: constant   Character: dull pain, deep ache  Aggravating Factors: Standing for long periods of time, eating a lot makes it worse Alleviating Factors: flexing his hips, heating pad helps, massager  Timing: worse first thing in the morning, gets better when getting up and walking around, does okay sitting in his desk chair Severity: 7/10 at worst, 2/10 currently  Denies No new history of trauma, history of prolonged steroid use, bowel or bladder incontinence, urinary retention, saddle anesthesia, weakness in extremities, fevers, chills, IV drug use, hemodialysis, hx cancer, changes in weight, night sweats. No history of osteoporosis.  No history of prostate cancer.  Has chronic peripheral neuropathy, without changes.  Occupation: No heavy lifting, repetitive vibrations, bending or twisting motions.  Has a history of ankylosing spondylitis and takes ibuprofen as needed for pain.  Did PT and OT with lumbar fracture.  Is not longer doing those exercises.  Right Hand Numbness and Tingling Started 4 days ago, has not improved, has not gotten worse States it is very mild  No upper back pain No weakness in his hand Right handed Mostly in his 5th digit, a little in the first digit, no tingling in the others No radiation or pain into wrist and forearm Has only had this before when he has slept on his arm wrong   PERTINENT  PMH / PSH: Hx Ankylosing spondylitis, Hx MDD, HLD, Peripheral neuropathy, HTN, CAD, aortic atherosclerosis, Hx wedge compression fracture of L1 01/2019  OBJECTIVE:   BP (!) 162/82   Pulse 71   Ht 6\' 2"  (1.88 m)   Wt 242 lb 6.4 oz (110 kg)   SpO2 97%   BMI 31.12 kg/m    Physical Exam:  General: 69 y.o. male in NAD Lungs: Breathing comfortably on  room air Skin: warm and dry  Back exam Inspection: No obvious deformities, does have hypopigmentation over left lower back secondary to vitiligo, no erythema Palpation: No tenderness to palpation along length of thoracic and lumbar spine, specifically at L1, does have tenderness to palpation at SI joint bilaterally and mild tenderness to palpation of right piriformis ROM: Mildly decreased extension to 15 degrees, full range of motion in flexion Strength: 5/5 strength BUE/BLE Special Tests: Negative stork sign bilaterally, negative seated straight leg raise, unable to perform lying straight leg raise given pain with lying supine, negative Spurling's Neurovascular: Neurovascularly intact, 2+ radial pulses and dorsalis pedis pulses bilaterally, sensation intact throughout bilateral upper and lower extremities  Right hand: Inspection: No obvious deformity. No swelling, erythema or bruising.  Linear scar on ulnar aspect of right fifth digit Palpation: no TTP, thickened flexor digitorum tendons of third and fourth digits on bilateral hands ROM: Full ROM of the digits and wrist. Fully able to extend and flex all finger except right fifth digit which cannot fully extend. Strength: 5/5 strength in the forearm, wrist and interosseus muscles Neurovascular: NV intact Special tests: negative tinel's at the carpal tunnel, negative Phalen's and reverse Phalen's, negative Tinel's at cubital tunnel     ASSESSMENT/PLAN:   Chronic bilateral low back pain without sciatica No red flag symptoms on history or examination.  Exam and history consistent with ankylosing spondylitis, which patient has previously been diagnosed with.  Has had imaging that corresponds to this previously as  well.  He has been having improvement with ibuprofen, would not want him to be on long-term NSAIDs especially given CAD, however, given that this is very painful for him, will try naproxen 500 mg twice daily x2 weeks to decrease  inflammation.  Naproxen chosen given least risk of cardiovascular side effects.  Patient advised to avoid other NSAIDs while taking this.  We will also refer to physical therapy.  Advised to use heat, can also use topical Biofreeze as needed.  We will also refer to rheumatology at this time.  He may need TNF alpha inhibitors in the future.  Would hold off on further imaging at this time, but could consider if no improvement.  Return for follow-up in 4 weeks.  SPONDYLITIS, ANKYLOSING Has a known history of this.  Suspect that this is currently the cause of his back pain.  Will refer to rheumatology, as he would not be a candidate for long-term NSAID use.  He may benefit from TNF alpha inhibitors in the future.  Would defer further imaging or blood tests to them.  Numbness and tingling in right hand Does not seem to have any red flag symptoms.  No evidence of cubital tunnel syndrome or carpal tunnel.  Symptoms seem to be very mild.  Do not seem to be in a distribution that would be coming from the T-spine.  Patient is neurovascularly intact on exam.  Could be secondary to overuse, does not seem to be bothering the patient very much.  We will continue to monitor.     Gregory Hansen, [place]

## 2019-07-13 NOTE — Assessment & Plan Note (Signed)
No red flag symptoms on history or examination.  Exam and history consistent with ankylosing spondylitis, which patient has previously been diagnosed with.  Has had imaging that corresponds to this previously as well.  He has been having improvement with ibuprofen, would not want him to be on long-term NSAIDs especially given CAD, however, given that this is very painful for him, will try naproxen 500 mg twice daily x2 weeks to decrease inflammation.  Naproxen chosen given least risk of cardiovascular side effects.  Patient advised to avoid other NSAIDs while taking this.  We will also refer to physical therapy.  Advised to use heat, can also use topical Biofreeze as needed.  We will also refer to rheumatology at this time.  He may need TNF alpha inhibitors in the future.  Would hold off on further imaging at this time, but could consider if no improvement.  Return for follow-up in 4 weeks.

## 2019-07-18 ENCOUNTER — Encounter: Payer: Self-pay | Admitting: Family Medicine

## 2019-07-19 ENCOUNTER — Other Ambulatory Visit: Payer: Self-pay | Admitting: Family Medicine

## 2019-07-19 ENCOUNTER — Other Ambulatory Visit: Payer: Self-pay | Admitting: Cardiovascular Disease

## 2019-07-19 DIAGNOSIS — M545 Low back pain, unspecified: Secondary | ICD-10-CM

## 2019-07-19 MED ORDER — NAPROXEN 500 MG PO TABS: 500.0000 mg | ORAL_TABLET | Freq: Two times a day (BID) | ORAL | 0 refills | Status: DC

## 2019-07-20 NOTE — Telephone Encounter (Signed)
Pt's pharmacy is requesting Dr. Burt Knack to prescribed Sildenafil for the pt. Would Dr. Burt Knack like to prescribe this medication? Please address

## 2019-07-21 NOTE — Telephone Encounter (Signed)
Yes.  He has Rx'd it for him in the past.  It is ok. Richardson Dopp, PA-C    07/21/2019 5:25 PM

## 2019-07-27 DIAGNOSIS — H35411 Lattice degeneration of retina, right eye: Secondary | ICD-10-CM | POA: Diagnosis not present

## 2019-07-27 DIAGNOSIS — H43813 Vitreous degeneration, bilateral: Secondary | ICD-10-CM | POA: Diagnosis not present

## 2019-07-27 DIAGNOSIS — H31091 Other chorioretinal scars, right eye: Secondary | ICD-10-CM | POA: Diagnosis not present

## 2019-07-27 DIAGNOSIS — H43812 Vitreous degeneration, left eye: Secondary | ICD-10-CM | POA: Diagnosis not present

## 2019-07-27 DIAGNOSIS — H2513 Age-related nuclear cataract, bilateral: Secondary | ICD-10-CM | POA: Diagnosis not present

## 2019-08-02 ENCOUNTER — Ambulatory Visit: Payer: Medicare Other | Attending: Family Medicine | Admitting: Physical Therapy

## 2019-08-02 ENCOUNTER — Encounter: Payer: Self-pay | Admitting: Physical Therapy

## 2019-08-02 ENCOUNTER — Other Ambulatory Visit: Payer: Self-pay

## 2019-08-02 DIAGNOSIS — M545 Low back pain, unspecified: Secondary | ICD-10-CM

## 2019-08-02 DIAGNOSIS — G8929 Other chronic pain: Secondary | ICD-10-CM | POA: Diagnosis not present

## 2019-08-02 DIAGNOSIS — R293 Abnormal posture: Secondary | ICD-10-CM | POA: Insufficient documentation

## 2019-08-03 NOTE — Therapy (Signed)
[place], [place], [address] Phone: [phone]   Fax:  [phone]  Physical Therapy Evaluation  Patient Details  Name: Gregory Hansen MRN: [id number] Date of Birth: 1950-10-11 Referring Provider (PT): [name], DO, [name], MD   Encounter Date: 08/02/2019  PT End of Session - 08/03/19 0949    Visit Number  1    Number of Visits  12    Date for PT Re-Evaluation  09/13/19    Authorization Type  [employer] Medicare    PT Start Time  1630    PT Stop Time  1714    PT Time Calculation (min)  44 min    Activity Tolerance  Patient tolerated treatment well    Behavior During Therapy  [facility] for tasks assessed/performed       Past Medical History:  Diagnosis Date  . Anginal pain ([place])   . Anxiety   . Arthritis    SPINE   . Arthritis   . Coronary artery disease    s/p multiple percutaneous interventions, PCI 202 for DMI and DES distal RCA and CFX-OM 2006  . Coronary artery disease   . Depression   . GERD (gastroesophageal reflux disease)   . Hyperlipidemia    mixed  . Hyperlipidemia   . Hypertension   . MI (myocardial infarction) ([place])   . Myocardial infarction ([place])   . Peripheral neuropathy    calves and both feet  . Peripheral neuropathy    calves and both feet  . Severe sepsis with acute organ dysfunction ([place]) 12/03/2016  . Spondylitis, ankylosing ([place])     Past Surgical History:  Procedure Laterality Date  . CARDIAC CATHETERIZATION     stent RCA June3, 2002, Stent OM/PTCA circumflex August 13, 2000  . CORONARY ANGIOPLASTY WITH STENT PLACEMENT     first one in 2006; had another place 3 months ago (August 2013)  . CORONARY ANGIOPLASTY WITH STENT PLACEMENT  03/26/2013   RCA           DR COOPER  . CORONARY ANGIOPLASTY WITH STENT PLACEMENT    . CORONARY ARTERY BYPASS GRAFT N/A 10/29/2013   Procedure: CORONARY ARTERY BYPASS GRAFTING x 4 (LIMA-LAD, SVG-OM, SVG-PD-PL) ENDOSCOPIC VEIN  HARVEST RIGHT THIGH;  Surgeon: Gaye Pollack, MD;  Location: [facility] OR;  Service: Open Heart Surgery;  Laterality: N/A;  . CORONARY ARTERY BYPASS GRAFT    . FINGER SURGERY     5  TH     DIGIT RIGHT HAND  . FINGER SURGERY    . INTRAOPERATIVE TRANSESOPHAGEAL ECHOCARDIOGRAM N/A 10/29/2013   Procedure: INTRAOPERATIVE TRANSESOPHAGEAL ECHOCARDIOGRAM;  Surgeon: Gaye Pollack, MD;  Location: [facility] OR;  Service: Open Heart Surgery;  Laterality: N/A;  . KNEE ARTHROSCOPY W/ LASER  2003  . LEFT HEART CATHETERIZATION WITH CORONARY ANGIOGRAM N/A 10/01/2011   Procedure: LEFT HEART CATHETERIZATION WITH CORONARY ANGIOGRAM;  Surgeon: [name], MD;  Location[facility] C[facility] LAB;  Service: Cardiovascular;  Laterality: N/A;  . LEFT HEART CATHETERIZATION WITH CORONARY ANGIOGRAM N/A 03/26/2013   Procedure: LEFT HEART CATHETERIZATION WITH CORONARY ANGIOGRAM;  Surgeon: Blane Ohara, MD;  Location: [facility] CATH LAB;  Service: Cardiovascular;  Laterality: N/A;  . LEFT HEART CATHETERIZATION WITH CORONARY ANGIOGRAM N/A 10/25/2013   Procedure: LEFT HEART CATHETERIZATION WITH CORONARY ANGIOGRAM;  Surgeon: Blane Ohara, MD;  Location: [facility] CATH LAB;  Service: Cardiovascular;  Laterality: N/A;    There were no vitals filed for this visit.   Subjective  Assessment - 08/03/19 0936    Subjective  Pt. is a 69 y/o male referred to PT for persistent LBP s/p fall 01/26/19 with L1 compression fracture. Pain had persisted in lumbar region lower than original fracture and is exacerbated in particular with standing. Past PT includes acute care and IP Rehab with SNF stay and pt. initially used RW with progression to cane and is currently ambulating without AD. See pertinent history below.    Pertinent History  Ankylosing spondylitis, anxiety/depression, CAD, cardiac stents, MI, CABG 2015, RLE cellulitis, rhabdomyolysis, peripheral neuropathy    Limitations  Standing;Lifting;House hold activities    Diagnostic tests  CT scan    Patient Stated  Goals  resolve back pain    Currently in Pain?  Yes    Pain Score  3     Pain Location  Back    Pain Orientation  Lower    Pain Descriptors / Indicators  Dull    Pain Type  Chronic pain    Pain Onset  More than a month ago    Pain Frequency  Intermittent    Aggravating Factors   prolonged standing, activities such as doing dishes    Pain Relieving Factors  rest/sitting, heat    Effect of Pain on Daily Activities  limits positional tolerance for standing         [facility] PT Assessment - 08/03/19 0001      Assessment   Medical Diagnosis  Chronic LBP without sciatica, ankylosing spondylitis    Referring Provider (PT)  [name], DO, [name], MD    Onset Date/Surgical Date  01/26/19    Hand Dominance  Right    Prior Therapy  acute care and IP Rehab/SNF PT      Precautions   Precautions  None      Restrictions   Weight Bearing Restrictions  No      Balance Screen   Has the patient fallen in the past 6 months  No   fall injury for symptom onset >6 months ago     [place] residence    [place]   lives with son   Type of [place]  --   2 steps, garage entrance has hand hold   Home Layout  One level    [place] - 2 wheels;Cane - single point      Prior Function   Level of Independence  Independent with basic ADLs;Independent with community mobility without device      Cognition   Overall Cognitive Status  Within Functional Limits for tasks assessed      Observation/Other Assessments   Focus on Therapeutic Outcomes (FOTO)   39% limited      Sensation   Additional Comments  dermatomal screen for L2-S2 grossly intact bilat. but general LE diminished sensation bilat. LE distal to knees      Posture/Postural Control   Posture/Postural Control  Postural limitations    Postural Limitations  Rounded Shoulders;Increased thoracic kyphosis      ROM / Strength   AROM / PROM  / Strength  AROM;Strength      AROM   Overall AROM Comments  bilat. hip AROM/PROM grossly [facility]    AROM Assessment Site  Lumbar    Lumbar Flexion  80    Lumbar Extension  10    Lumbar - Right Side Bend  30    Lumbar -  Left Side Bend  22    Lumbar - Right Rotation  50%    Lumbar - Left Rotation  50%      Strength   Strength Assessment Site  Hip;Knee;Ankle    Right/Left Hip  Right;Left    Right Hip Flexion  5/5    Right Hip External Rotation   5/5    Right Hip Internal Rotation  5/5    Left Hip Flexion  5/5    Left Hip External Rotation  5/5    Left Hip Internal Rotation  5/5    Right/Left Knee  Right;Left    Right Knee Flexion  5/5    Right Knee Extension  5/5    Left Knee Flexion  5/5    Left Knee Extension  5/5    Right/Left Ankle  Right;Left    Right Ankle Dorsiflexion  5/5    Right Ankle Inversion  5/5    Right Ankle Eversion  5/5    Left Ankle Dorsiflexion  5/5    Left Ankle Inversion  5/5    Left Ankle Eversion  5/5      Flexibility   Soft Tissue Assessment /Muscle Length  --   mild hamstring tightness bilat.     Palpation   SI assessment   (+) longsitting test for rigth anterior innominate rotation    Palpation comment  muscle tenderness left>right QL region and lower lumbar paraspinals      Special Tests   Other special tests  SLR (-)                  Objective measurements completed on examination: See above findings.      [facility] Adult PT Treatment/Exercise - 08/03/19 0001      Exercises   Exercises  --   HEP handout review     Manual Therapy   Manual Therapy  Muscle Energy Technique    Muscle Energy Technique  right hamstring and left hip flexor isometrics for right anterior innominate rotation correction 5 x 5 sec             PT Education - 08/03/19 0948    Education Details  eval findings, symptom etiology, HEP, POC, dry needling, FOTO patient report    Person(s) Educated  Patient    Methods  Explanation;Verbal cues;Handout     Comprehension  Verbalized understanding          PT Long Term Goals - 08/03/19 0957      PT LONG TERM GOAL #1   Title  Independent with HEP    Baseline  needs HEP    Time  6    Period  Weeks    Status  New    Target Date  09/13/19      PT LONG TERM GOAL #2   Title  Improve FOTO outcome measure score to 31% or less impairment    Baseline  39% limited    Time  6    Period  Weeks    Status  New    Target Date  09/13/19      PT LONG TERM GOAL #3   Title  Tolerate stsanding for activities such as doing dishes periods at least 20-30 min without limitation due to LBP    Baseline  tolerance limited    Time  6    Period  Weeks    Status  New    Target Date  09/13/19      PT LONG TERM  GOAL #4   Title  Perform community level ambulation for activities such as shopping and perform lifting for chores/IADLs without limitation due to LBP    Time  6    Period  Weeks    Status  New    Target Date  09/13/19             Plan - 08/03/19 0950    Clinical Impression Statement  Pt. presents with chronic LBP s/p fall injury with L1 compression fracture with underlying ankylosing spondylitis/associated postural compromise. Current symptoms below region of original injury and suspect potential SI involvement given pain location and innominate rotation noted during eval. Pt. would benefit from PT to help relieve pain and address associated functional limitations for positional and activity tolerance.    Personal Factors and Comorbidities  Comorbidity 3+    Comorbidities  ankylosing spondylitis, history peripheral neuropathy, cardiac history, anxiety/depression    Examination-Activity Limitations  Stand;Lift;Carry;Locomotion Level    Examination-Participation Restrictions  Cleaning    Stability/Clinical Decision Making  Stable/Uncomplicated    Clinical Decision Making  Low    Rehab Potential  Good    PT Frequency  2x / week    PT Duration  6 weeks    PT Treatment/Interventions   ADLs/Self Care Home Management;Cryotherapy;Electrical Stimulation;Moist Heat;Therapeutic activities;Therapeutic exercise;Patient/family education;Manual techniques;Dry needling;Taping;Neuromuscular re-education;Functional mobility training    PT Next Visit Plan  review HEP as needed, check for innominate rotation and correct prn with METs, potential trial dry needling to QL (focus left) and lumbar paraspinals, STM prn, progression lumbopelvic stabilization and postural strengthening as tolerated    PT Home Exercise Plan  [id number]    Consulted and Agree with Plan of Care  Patient       Patient will benefit from skilled therapeutic intervention in order to improve the following deficits and impairments:  Pain, Postural dysfunction, Impaired flexibility, Decreased activity tolerance, Decreased range of motion  Visit Diagnosis: Chronic bilateral low back pain without sciatica  Abnormal posture     Problem List Patient Active Problem List   Diagnosis Date Noted  . Chronic bilateral low back pain without sciatica 07/13/2019  . Numbness and tingling in right hand 07/13/2019  . Rhabdomyolysis 02/02/2019  . Fall at home, initial encounter 02/02/2019  . Acute hepatitis 01/26/2019  . Leg cramping 12/22/2018  . Leg swelling 11/20/2018  . Cellulitis 03/30/2018  . Acute kidney failure ([place]) 03/30/2018  . Peripheral neuropathy 11/04/2017  . Gynecomastia, male 02/18/2017  . Healthcare maintenance 01/20/2017  . Aortic atherosclerosis ([place]) 12/03/2016  . Diarrhea due to cryptosporidium ([place])   . AKI (acute kidney injury) ([place])   . S/P CABG x 4 10/29/2013  . Hyperlipidemia 06/15/2008  . Depression, major, single episode, mild ([place]) 06/15/2008  . Essential hypertension 06/15/2008  . CAD (coronary artery disease) 06/15/2008  . GERD 06/15/2008  . SPONDYLITIS, ANKYLOSING 06/15/2008  . Atherosclerotic heart disease of native coronary artery without angina pectoris 06/15/2008  . Gastro-esophageal  reflux disease without esophagitis 06/15/2008  . Major depressive disorder, single episode 06/15/2008    Beaulah Dinning, PT, DPT 08/03/19 10:03 AM  [facility] [address] [place], [place], [address] Phone: [phone]   Fax:  [phone]  Name: REAGEN LONGENBERGER MRN: [id number] Date of Birth: 09-23-1950

## 2019-08-11 ENCOUNTER — Ambulatory Visit (INDEPENDENT_AMBULATORY_CARE_PROVIDER_SITE_OTHER): Payer: Medicare Other | Admitting: Family Medicine

## 2019-08-11 ENCOUNTER — Encounter: Payer: Self-pay | Admitting: Family Medicine

## 2019-08-11 ENCOUNTER — Ambulatory Visit: Payer: Medicare Other | Attending: Family Medicine | Admitting: Physical Therapy

## 2019-08-11 ENCOUNTER — Encounter: Payer: Self-pay | Admitting: Physical Therapy

## 2019-08-11 ENCOUNTER — Other Ambulatory Visit: Payer: Self-pay

## 2019-08-11 VITALS — BP 130/74 | HR 80 | Ht 74.0 in | Wt 243.1 lb

## 2019-08-11 DIAGNOSIS — Z8619 Personal history of other infectious and parasitic diseases: Secondary | ICD-10-CM

## 2019-08-11 DIAGNOSIS — G8929 Other chronic pain: Secondary | ICD-10-CM | POA: Insufficient documentation

## 2019-08-11 DIAGNOSIS — R293 Abnormal posture: Secondary | ICD-10-CM | POA: Diagnosis not present

## 2019-08-11 DIAGNOSIS — I10 Essential (primary) hypertension: Secondary | ICD-10-CM | POA: Diagnosis not present

## 2019-08-11 DIAGNOSIS — E785 Hyperlipidemia, unspecified: Secondary | ICD-10-CM | POA: Diagnosis not present

## 2019-08-11 DIAGNOSIS — M545 Low back pain: Secondary | ICD-10-CM | POA: Insufficient documentation

## 2019-08-11 DIAGNOSIS — R252 Cramp and spasm: Secondary | ICD-10-CM | POA: Diagnosis not present

## 2019-08-11 DIAGNOSIS — R21 Rash and other nonspecific skin eruption: Secondary | ICD-10-CM

## 2019-08-11 MED ORDER — NAPROXEN 500 MG PO TBEC: 500.0000 mg | DELAYED_RELEASE_TABLET | Freq: Two times a day (BID) | ORAL | 1 refills | Status: DC

## 2019-08-11 MED ORDER — TRIAMCINOLONE ACETONIDE 0.1 % EX CREA
1.0000 | TOPICAL_CREAM | Freq: Two times a day (BID) | CUTANEOUS | 1 refills | Status: DC
Start: 2019-08-11 — End: 2019-08-11

## 2019-08-11 MED ORDER — TRIAMCINOLONE ACETONIDE 0.1 % EX CREA
1.0000 | TOPICAL_CREAM | Freq: Two times a day (BID) | CUTANEOUS | 1 refills | Status: DC
Start: 2019-08-11 — End: 2020-03-07

## 2019-08-11 NOTE — Patient Instructions (Signed)

## 2019-08-11 NOTE — Progress Notes (Signed)
    SUBJECTIVE:   CHIEF COMPLAINT / HPI:   Back pain - seen previously 5/4. With ankylosing spondylitis and h/o vertebral fracture s/p fall 01/2019. Back pain persistent but better with naproxen. Has been using nightly ibuprofen since running out. Doing PT. Not able to get into rheum until Sept. Standing worse.   Rash - long standing h/o circular erythematous and itchy rash on bilateral arms and legs. Previously followed with Derm, well controlled on fluocinonide, needs refill. No new detergents or soaps. No fevers, mouth sores. Never had biopsy.  HLD - reports compliance with statin. No muscle cramps.   HTN - Compliant with losartan, metoprolol. No CP, SOB, leg swelling.   Health Maintenance - has had full COVID vaccination, gets 2nd shingles shot today.   PERTINENT  PMH / PSH: HTN, CAD s/p CABGx4, GERD, h/o Cryptosporidium diarrhea, ankylosing spondylitis, HLD, depression  OBJECTIVE:   BP 130/74   Pulse 80   Ht 6\' 2"  (1.88 m)   Wt 243 lb 2 oz (110.3 kg)   SpO2 98%   BMI 31.22 kg/m   Gen: overweight, in NAD Card: regular rate, warm and well perfused, no LE swelling Skin: scattered small circular few mm in size flesh colored macules on bilateral arms and legs with no overlying scale or bleeding.      ASSESSMENT/PLAN:   Essential hypertension At goal for age, no changes made today.  Hyperlipidemia Tolerating statin, plan to recheck direct LDL at follow up. Continue current dose.  Leg cramping Resolved s/p switching statin.  Chronic bilateral low back pain without sciatica Multifactorial, 2/2 ankylosing spondylitis and h/o lumbar vertebral fracture. No red flags. Will provide refill of naproxen until able to get in with Rheum given less cardiotoxic. Continue with PT.  Rash Most consistent with nummular eczema, certainly likely autoimmune component. Considered but low likelihood of infection, bug/insect bite, drug reaction. Previously followed with Derm with prior  workup though never had biopsy. Given relief with super high potency topical steroid, will decrease potency with triamcinolone. Instructed to call if not providing relief.     [name], [place]

## 2019-08-11 NOTE — Patient Instructions (Signed)
It was great to see you!  Our plans for today:  - I sent the steroid cream and naproxen to your pharmacy. - Continue to see physical therapy. - No other changes to your medications today.  - Follow up in 6 months.   Take care and seek immediate care sooner if you develop any concerns.   Dr. [facility]

## 2019-08-11 NOTE — Therapy (Signed)
[place], [place], [address] Phone: [phone]   Fax:  [phone]  Physical Therapy Treatment  Patient Details  Name: Gregory Hansen MRN: [id number] Date of Birth: 03-15-1950 Referring Provider (PT): [name], DO, [name], MD   Encounter Date: 08/11/2019  PT End of Session - 08/11/19 1411    Visit Number  2    Number of Visits  12    Date for PT Re-Evaluation  09/13/19    Authorization Type  UHC Medicare    PT Start Time  [id number]    PT Stop Time  1420   time spent for dry needling and estim not included in direct minutes   PT Time Calculation (min)  56 min    Activity Tolerance  Patient tolerated treatment well    Behavior During Therapy  [facility] for tasks assessed/performed       Past Medical History:  Diagnosis Date  . Anginal pain ([place])   . Anxiety   . Arthritis    SPINE   . Arthritis   . Coronary artery disease    s/p multiple percutaneous interventions, PCI 202 for DMI and DES distal RCA and CFX-OM 2006  . Coronary artery disease   . Depression   . GERD (gastroesophageal reflux disease)   . Hyperlipidemia    mixed  . Hyperlipidemia   . Hypertension   . MI (myocardial infarction) ([place])   . Myocardial infarction ([place])   . Peripheral neuropathy    calves and both feet  . Peripheral neuropathy    calves and both feet  . Severe sepsis with acute organ dysfunction ([place]) 12/03/2016  . Spondylitis, ankylosing ([place])     Past Surgical History:  Procedure Laterality Date  . CARDIAC CATHETERIZATION     stent RCA June3, 2002, Stent OM/PTCA circumflex August 13, 2000  . CORONARY ANGIOPLASTY WITH STENT PLACEMENT     first one in 2006; had another place 3 months ago (August 2013)  . CORONARY ANGIOPLASTY WITH STENT PLACEMENT  03/26/2013   RCA           DR COOPER  . CORONARY ANGIOPLASTY WITH STENT PLACEMENT    . CORONARY ARTERY BYPASS GRAFT N/A 10/29/2013   Procedure: CORONARY ARTERY  BYPASS GRAFTING x 4 (LIMA-LAD, SVG-OM, SVG-PD-PL) ENDOSCOPIC VEIN HARVEST RIGHT THIGH;  Surgeon: Gaye Pollack, MD;  Location: [facility] OR;  Service: Open Heart Surgery;  Laterality: N/A;  . CORONARY ARTERY BYPASS GRAFT    . FINGER SURGERY     5  TH     DIGIT RIGHT HAND  . FINGER SURGERY    . INTRAOPERATIVE TRANSESOPHAGEAL ECHOCARDIOGRAM N/A 10/29/2013   Procedure: INTRAOPERATIVE TRANSESOPHAGEAL ECHOCARDIOGRAM;  Surgeon: Gaye Pollack, MD;  Location: [facility] OR;  Service: Open Heart Surgery;  Laterality: N/A;  . KNEE ARTHROSCOPY W/ LASER  2003  . LEFT HEART CATHETERIZATION WITH CORONARY ANGIOGRAM N/A 10/01/2011   Procedure: LEFT HEART CATHETERIZATION WITH CORONARY ANGIOGRAM;  Surgeon: [name], MD;  Location: [facility] CATH LAB;  Service: Cardiovascular;  Laterality: N/A;  . LEFT HEART CATHETERIZATION WITH CORONARY ANGIOGRAM N/A 03/26/2013   Procedure: LEFT HEART CATHETERIZATION WITH CORONARY ANGIOGRAM;  Surgeon: Blane Ohara, MD;  Location: [facility] CATH LAB;  Service: Cardiovascular;  Laterality: N/A;  . LEFT HEART CATHETERIZATION WITH CORONARY ANGIOGRAM N/A 10/25/2013   Procedure: LEFT HEART CATHETERIZATION WITH CORONARY ANGIOGRAM;  Surgeon: Blane Ohara, MD;  Location: [facility] CATH LAB;  Service: Cardiovascular;  Laterality: N/A;  There were no vitals filed for this visit.  Subjective Assessment - 08/11/19 1328    Subjective  Mild soreness after initial eval visit. LBP about the same as status at eval. 3/10 LBP this PM. Pt. reports had tried HEP with doing some exercises on floor but had discomfort-discussed to try doing in bed.    Pertinent History  Ankylosing spondylitis, anxiety/depression, CAD, cardiac stents, MI, CABG 2015, RLE cellulitis, rhabdomyolysis, peripheral neuropathy    Diagnostic tests  CT scan    Patient Stated Goals  resolve back pain    Currently in Pain?  Yes    Pain Score  3     Pain Location  Back    Pain Orientation  Lower    Pain Descriptors / Indicators  Dull    Pain Type   Chronic pain    Pain Onset  More than a month ago    Pain Frequency  Intermittent    Aggravating Factors   prolonged standing, activities such as doing dishes    Pain Relieving Factors  rest, heat    Effect of Pain on Daily Activities  limits standing tolerance         [facility] PT Assessment - 08/11/19 0001      Palpation   SI assessment   right posterior innominate rotation noted                    [facility] Adult PT Treatment/Exercise - 08/11/19 0001      Exercises   Exercises  Lumbar      Lumbar Exercises: Stretches   Passive Hamstring Stretch  Right;Left;2 reps;30 seconds    Piriformis Stretch  Right;Left;2 reps;30 seconds      Lumbar Exercises: Aerobic   Nustep  L5 x 5 min UE/LE      Lumbar Exercises: Standing   Row  AROM;Strengthening;20 reps    Theraband Level (Row)  Level 4 (Blue)    Shoulder Extension  AROM;Strengthening;Both;20 reps    Theraband Level (Shoulder Extension)  Level 3 (Green)      Lumbar Exercises: Supine   Pelvic Tilt  15 reps    Clam  15 reps    Clam Limitations  Blue Theraband    Bent Knee Raise  15 reps    Bent Knee Raise Limitations  with posterior pelvic tilt    Bridge with Ball Squeeze  15 reps      Manual Therapy   Manual Therapy  Joint mobilization    Joint Mobilization  LAD right hip grade I-III oscillations    Muscle Energy Technique  "shotgun" 5 x 5 sec ea., Right hip flexor and left hamstring isometrics 2 sets of 5 sec       Trigger Point Dry Needling - 08/11/19 0001    Consent Given?  Yes    Education Handout Provided  Yes    Muscles Treated Back/Hip  Gluteus maximus;Erector spinae;Lumbar multifidi;Quadratus lumborum    Dry Needling Comments  needling bilat. in prone with 32 gauge 50 mm needles    Electrical Stimulation Performed with Dry Needling  Yes    E-stim with Dry Needling Details  TENS 2 pps x 10 min           PT Education - 08/11/19 1410    Education Details  dry needling, exercises    Person(s)  Educated  Patient    Methods  Explanation;Demonstration;Verbal cues;Handout    Comprehension  Verbalized understanding  PT Long Term Goals - 08/03/19 0957      PT LONG TERM GOAL #1   Title  Independent with HEP    Baseline  needs HEP    Time  6    Period  Weeks    Status  New    Target Date  09/13/19      PT LONG TERM GOAL #2   Title  Improve FOTO outcome measure score to 31% or less impairment    Baseline  39% limited    Time  6    Period  Weeks    Status  New    Target Date  09/13/19      PT LONG TERM GOAL #3   Title  Tolerate stsanding for activities such as doing dishes periods at least 20-30 min without limitation due to LBP    Baseline  tolerance limited    Time  6    Period  Weeks    Status  New    Target Date  09/13/19      PT LONG TERM GOAL #4   Title  Perform community level ambulation for activities such as shopping and perform lifting for chores/IADLs without limitation due to LBP    Time  6    Period  Weeks    Status  New    Target Date  09/13/19            Plan - 08/11/19 1333    Clinical Impression Statement  Status similar to eval but given symptom duration expect progress will take time/more visits. Right posterior innominate rotation noted and addressed with METs. Tx. focus otherwise exercises and included trial dry needling to lumbar and hip region with good tolerance. Will monitor tx. response and continue to progress as tolerated.    Personal Factors and Comorbidities  Comorbidity 3+    Comorbidities  ankylosing spondylitis, history peripheral neuropathy, cardiac history, anxiety/depression    Examination-Activity Limitations  Stand;Lift;Carry;Locomotion Level    Examination-Participation Restrictions  Cleaning    Stability/Clinical Decision Making  Stable/Uncomplicated    Clinical Decision Making  Low    Rehab Potential  Good    PT Frequency  2x / week    PT Duration  6 weeks    PT Treatment/Interventions  ADLs/Self Care Home  Management;Cryotherapy;Electrical Stimulation;Moist Heat;Therapeutic activities;Therapeutic exercise;Patient/family education;Manual techniques;Dry needling;Taping;Neuromuscular re-education;Functional mobility training    PT Next Visit Plan  review HEP as needed, check for innominate rotation and correct prn with METs, potential trial dry needling to QL (focus left) and lumbar paraspinals, STM prn, progression lumbopelvic stabilization and postural strengthening as tolerated    PT Home Exercise Plan  [id number]    Consulted and Agree with Plan of Care  Patient       Patient will benefit from skilled therapeutic intervention in order to improve the following deficits and impairments:  Pain, Postural dysfunction, Impaired flexibility, Decreased activity tolerance, Decreased range of motion  Visit Diagnosis: Chronic bilateral low back pain without sciatica  Abnormal posture     Problem List Patient Active Problem List   Diagnosis Date Noted  . Chronic bilateral low back pain without sciatica 07/13/2019  . Numbness and tingling in right hand 07/13/2019  . Rhabdomyolysis 02/02/2019  . Fall at home, initial encounter 02/02/2019  . Acute hepatitis 01/26/2019  . Leg cramping 12/22/2018  . Leg swelling 11/20/2018  . Cellulitis 03/30/2018  . Acute kidney failure ([place]) 03/30/2018  . Peripheral neuropathy 11/04/2017  . Gynecomastia, male 02/18/2017  .  Healthcare maintenance 01/20/2017  . Aortic atherosclerosis ([place]) 12/03/2016  . Diarrhea due to cryptosporidium ([place])   . AKI (acute kidney injury) ([place])   . S/P CABG x 4 10/29/2013  . Hyperlipidemia 06/15/2008  . Depression, major, single episode, mild ([place]) 06/15/2008  . Essential hypertension 06/15/2008  . CAD (coronary artery disease) 06/15/2008  . GERD 06/15/2008  . SPONDYLITIS, ANKYLOSING 06/15/2008  . Atherosclerotic heart disease of native coronary artery without angina pectoris 06/15/2008  . Gastro-esophageal reflux disease  without esophagitis 06/15/2008  . Major depressive disorder, single episode 06/15/2008    Beaulah Dinning, PT, DPT 08/11/19 2:21 PM  [facility] [address] [place], [place], [address] Phone: [phone]   Fax:  [phone]  Name: Gregory Hansen MRN: [id number] Date of Birth: Jul 24, 1950

## 2019-08-12 ENCOUNTER — Encounter: Payer: Self-pay | Admitting: Family Medicine

## 2019-08-12 NOTE — Assessment & Plan Note (Signed)
Tolerating statin, plan to recheck direct LDL at follow up. Continue current dose.

## 2019-08-12 NOTE — Assessment & Plan Note (Signed)
Resolved s/p switching statin.

## 2019-08-12 NOTE — Assessment & Plan Note (Addendum)
Most consistent with nummular eczema, certainly likely autoimmune component. Considered but low likelihood of infection, bug/insect bite, drug reaction. Previously followed with Derm with prior workup though never had biopsy. Given relief with super high potency topical steroid, will decrease potency with triamcinolone. Instructed to call if not providing relief.

## 2019-08-12 NOTE — Assessment & Plan Note (Signed)
At goal for age, no changes made today.

## 2019-08-12 NOTE — Assessment & Plan Note (Addendum)
Multifactorial, 2/2 ankylosing spondylitis and h/o lumbar vertebral fracture. No red flags. Will provide refill of naproxen until able to get in with Rheum given less cardiotoxic. Continue with PT.

## 2019-08-16 ENCOUNTER — Other Ambulatory Visit: Payer: Self-pay | Admitting: Family Medicine

## 2019-08-16 ENCOUNTER — Encounter: Payer: Self-pay | Admitting: Family Medicine

## 2019-08-16 ENCOUNTER — Other Ambulatory Visit: Payer: Self-pay

## 2019-08-16 ENCOUNTER — Encounter: Payer: Self-pay | Admitting: Physical Therapy

## 2019-08-16 ENCOUNTER — Ambulatory Visit: Payer: Medicare Other | Admitting: Physical Therapy

## 2019-08-16 DIAGNOSIS — R293 Abnormal posture: Secondary | ICD-10-CM | POA: Diagnosis not present

## 2019-08-16 DIAGNOSIS — G8929 Other chronic pain: Secondary | ICD-10-CM | POA: Diagnosis not present

## 2019-08-16 DIAGNOSIS — M545 Low back pain: Secondary | ICD-10-CM | POA: Diagnosis not present

## 2019-08-16 NOTE — Therapy (Signed)
[place], [place], [address] Phone: [phone]   Fax:  [phone]  Physical Therapy Treatment  Patient Details  Name: Gregory Hansen MRN: [id number] Date of Birth: [birth year] Referring Provider (PT): [name], DO, [name], MD   Encounter Date: 08/16/2019  PT End of Session - 08/16/19 1344    Visit Number  3    Number of Visits  12    Date for PT Re-Evaluation  09/13/19    Authorization Type  [employer]HC Medicare    PT Start Time  1329    PT Stop Time  1414    PT Time Calculation (min)  45 min    Activity Tolerance  Patient tolerated treatment well    Behavior During Therapy  [facility] for tasks assessed/performed       Past Medical History:  Diagnosis Date  . Acute hepatitis 01/26/2019  . Acute kidney failure ([place]) 03/30/2018  . AKI (acute kidney injury) ([place])   . Anginal pain ([place])   . Anxiety   . Arthritis    SPINE   . Arthritis   . Cellulitis 03/30/2018  . Coronary artery disease    s/p multiple percutaneous interventions, PCI 202 for DMI and DES distal RCA and CFX-OM 2006  . Coronary artery disease   . Depression   . GERD (gastroesophageal reflux disease)   . Hyperlipidemia    mixed  . Hyperlipidemia   . Hypertension   . MI (myocardial infarction) ([place])   . Myocardial infarction ([place])   . Peripheral neuropathy    calves and both feet  . Peripheral neuropathy    calves and both feet  . Rhabdomyolysis 02/02/2019  . Severe sepsis with acute organ dysfunction ([place]) 12/03/2016  . Spondylitis, ankylosing ([place])     Past Surgical History:  Procedure Laterality Date  . CARDIAC CATHETERIZATION     stent RCA June3, 2002, Stent OM/PTCA circumflex August 13, 2000  . CORONARY ANGIOPLASTY WITH STENT PLACEMENT     first one in 2006; had another place 3 months ago (August 2013)  . CORONARY ANGIOPLASTY WITH STENT PLACEMENT  03/26/2013   RCA           DR COOPER  . CORONARY ANGIOPLASTY WITH  STENT PLACEMENT    . CORONARY ARTERY BYPASS GRAFT N/A 10/29/2013   Procedure: CORONARY ARTERY BYPASS GRAFTING x 4 (LIMA-LAD, SVG-OM, SVG-PD-PL) ENDOSCOPIC VEIN HARVEST RIGHT THIGH;  Surgeon: Gaye Pollack, MD;  Location: [facility] OR;  Service: Open Heart Surgery;  Laterality: N/A;  . CORONARY ARTERY BYPASS GRAFT    . FINGER SURGERY     5  TH     DIGIT RIGHT HAND  . FINGER SURGERY    . INTRAOPERATIVE TRANSESOPHAGEAL ECHOCARDIOGRAM N/A 10/29/2013   Procedure: INTRAOPERATIVE TRANSESOPHAGEAL ECHOCARDIOGRAM;  Surgeon: Gaye Pollack, MD;  Location: [facility] OR;  Service: Open Heart Surgery;  Laterality: N/A;  . KNEE ARTHROSCOPY W/ LASER  2003  . LEFT HEART CATHETERIZATION WITH CORONARY ANGIOGRAM N/A 10/01/2011   Procedure: LEFT HEART CATHETERIZATION WITH CORONARY ANGIOGRAM;  Surgeon: [name], MD;  Location: [facility] CATH LAB;  Service: Cardiovascular;  Laterality: N/A;  . LEFT HEART CATHETERIZATION WITH CORONARY ANGIOGRAM N/A 03/26/2013   Procedure: LEFT HEART CATHETERIZATION WITH CORONARY ANGIOGRAM;  Surgeon: Blane Ohara, MD;  Location: [facility] CATH LAB;  Service: Cardiovascular;  Laterality: N/A;  . LEFT HEART CATHETERIZATION WITH CORONARY ANGIOGRAM N/A 10/25/2013   Procedure: LEFT HEART CATHETERIZATION WITH CORONARY ANGIOGRAM;  Surgeon: Legrand Como  Ree Kida, MD;  Location: [facility] CATH LAB;  Service: Cardiovascular;  Laterality: N/A;    There were no vitals filed for this visit.  Subjective Assessment - 08/16/19 1331    Subjective  Pt. reports initially some soreness after session but noted significant improvement with decreased back pain, able to stand up straighter for posture. No pain at rest but still with pain 3/10 with walking in low back.    Pertinent History  Ankylosing spondylitis, anxiety/depression, CAD, cardiac stents, MI, CABG 2015, RLE cellulitis, rhabdomyolysis, peripheral neuropathy         [facility] PT Assessment - 08/16/19 0001      Palpation   SI assessment   No innominate rotation noted                     [facility] Adult PT Treatment/Exercise - 08/16/19 0001      Lumbar Exercises: Stretches   Passive Hamstring Stretch  Right;Left;2 reps;30 seconds    Piriformis Stretch  Right;Left;2 reps;30 seconds      Lumbar Exercises: Aerobic   Nustep  L5 x 5 min UE/LE      Lumbar Exercises: Standing   Functional Squats Limitations  TRX squat 2x10 cues to reach hips back/avoid knee fleixon past toes    Row Limitations  [employer] cable row 10 lbs. 2x10    Shoulder Extension Limitations  Fre[employer] cable ext 10 lbs. 2x10      Lumbar Exercises: Supine   Clam  20 reps    Clam Limitations  Blue Theraband    Dead Bug  15 reps    Bridge with [employer]  15 reps       Trigger Point Dry Needling - 08/16/19 0001    Consent Given?  Yes    Muscles Treated Back/Hip  Gluteus maximus;Erector spinae;Lumbar multifidi;Quadratus lumborum    Dry Needling Comments  needling bilat. in prone with 32 gauge 50 mm needles    Electrical Stimulation Performed with Dry Needling  Yes    E-stim with Dry Needling Details  TENS 2 pps x 10 min           PT Education - 08/16/19 1403    Education Details  exercises, POC    Person(s) Educated  Patient    Methods  Explanation;Demonstration;Verbal cues    Comprehension  Verbalized understanding;Returned demonstration          PT Long Term Goals - 08/03/19 0957      PT LONG TERM GOAL #1   Title  Independent with HEP    Baseline  needs HEP    Time  6    Period  Weeks    Status  New    Target Date  09/13/19      PT LONG TERM GOAL #2   Title  Improve FOTO outcome measure score to 31% or less impairment    Baseline  39% limited    Time  6    Period  Weeks    Status  New    Target Date  09/13/19      PT LONG TERM GOAL #3   Title  Tolerate stsanding for activities such as doing dishes periods at least 20-30 min without limitation due to LBP    Baseline  tolerance limited    Time  6    Period  Weeks    Status  New    Target Date   09/13/19      PT LONG TERM GOAL #4  Title  Perform community level ambulation for activities such as shopping and perform lifting for chores/IADLs without limitation due to LBP    Time  6    Period  Weeks    Status  New    Target Date  09/13/19            Plan - 08/16/19 1403    Clinical Impression Statement  Pt. is responding well to therapy treatments to date with decreased LBP and improving postural + positional tolerance for upright posture. Continued exercise progress along with brief manual for STM and another trial dry needling as noted per flowsheet. Session well-tolerated with minimal discomfort-expect progress re: therapy goals will be gradual given symptom duration and underlying comorbidities but so far progressing well.    Personal Factors and Comorbidities  Comorbidity 3+    Comorbidities  ankylosing spondylitis, history peripheral neuropathy, cardiac history, anxiety/depression    Examination-Activity Limitations  Stand;Lift;Carry;Locomotion Level    Examination-Participation Restrictions  Cleaning    Stability/Clinical Decision Making  Stable/Uncomplicated    Clinical Decision Making  Low    Rehab Potential  Good    PT Frequency  2x / week    PT Duration  6 weeks    PT Treatment/Interventions  ADLs/Self Care Home Management;Cryotherapy;Electrical Stimulation;Moist Heat;Therapeutic activities;Therapeutic exercise;Patient/family education;Manual techniques;Dry needling;Taping;Neuromuscular re-education;Functional mobility training    PT Next Visit Plan  check for innominate rotation and correct prn with METs, continue dry needling to QL (focus left) and lumbar paraspinals, STM prn, progression lumbopelvic stabilization and postural strengthening as tolerated    PT Home Exercise Plan  [email or web address]    Consulted and Agree with Plan of Care  Patient       Patient will benefit from skilled therapeutic intervention in order to improve the following deficits and impairments:   Pain, Postural dysfunction, Impaired flexibility, Decreased activity tolerance, Decreased range of motion  Visit Diagnosis: Chronic bilateral low back pain without sciatica  Abnormal posture     Problem List Patient Active Problem List   Diagnosis Date Noted  . Chronic bilateral low back pain without sciatica 07/13/2019  . Numbness and tingling in right hand 07/13/2019  . Fall at home, initial encounter 02/02/2019  . Leg cramping 12/22/2018  . Rash 05/23/2018  . Peripheral neuropathy 11/04/2017  . Gynecomastia, male 02/18/2017  . Healthcare maintenance 01/20/2017  . Aortic atherosclerosis ([place]) 12/03/2016  . History of cryptosporidiosis   . S/P CABG x 4 10/29/2013  . Hyperlipidemia 06/15/2008  . Depression, major, single episode, mild ([place]) 06/15/2008  . Essential hypertension 06/15/2008  . CAD (coronary artery disease) 06/15/2008  . SPONDYLITIS, ANKYLOSING 06/15/2008  . Atherosclerotic heart disease of native coronary artery without angina pectoris 06/15/2008  . Gastro-esophageal reflux disease without esophagitis 06/15/2008  . Major depressive disorder, single episode 06/15/2008    Beaulah Dinning, PT, DPT 08/16/19 2:06 PM  [facility] [address] [place], [place], [address] Phone: [phone]   Fax:  [phone]  Name: Gregory Hansen MRN: [id number] Date of Birth: [birth year]

## 2019-08-18 ENCOUNTER — Ambulatory Visit: Payer: Medicare Other | Admitting: Physical Therapy

## 2019-08-18 ENCOUNTER — Other Ambulatory Visit: Payer: Self-pay

## 2019-08-18 ENCOUNTER — Encounter: Payer: Self-pay | Admitting: Physical Therapy

## 2019-08-18 DIAGNOSIS — R293 Abnormal posture: Secondary | ICD-10-CM | POA: Diagnosis not present

## 2019-08-18 DIAGNOSIS — M545 Low back pain: Secondary | ICD-10-CM | POA: Diagnosis not present

## 2019-08-18 DIAGNOSIS — G8929 Other chronic pain: Secondary | ICD-10-CM | POA: Diagnosis not present

## 2019-08-18 NOTE — Therapy (Signed)
[place], [place], [address] Phone: [phone]   Fax:  [phone]  Physical Therapy Treatment  Patient Details  Name: Gregory Hansen MRN: [id number] Date of Birth: [birth year] Referring Provider (PT): [name], DO, [name], MD   Encounter Date: 08/18/2019  PT End of Session - 08/18/19 1343    Visit Number  4    Number of Visits  12    Date for PT Re-Evaluation  09/13/19    Authorization Type  UHC Medicare    PT Start Time  1326    PT Stop Time  1415    PT Time Calculation (min)  49 min    Activity Tolerance  Patient tolerated treatment well    Behavior During Therapy  [facility] for tasks assessed/performed       Past Medical History:  Diagnosis Date  . Acute hepatitis 01/26/2019  . Acute kidney failure ([place]) 03/30/2018  . AKI (acute kidney injury) ([place])   . Anginal pain ([place])   . Anxiety   . Arthritis    SPINE   . Arthritis   . Cellulitis 03/30/2018  . Coronary artery disease    s/p multiple percutaneous interventions, PCI 202 for DMI and DES distal RCA and CFX-OM 2006  . Coronary artery disease   . Depression   . GERD (gastroesophageal reflux disease)   . Hyperlipidemia    mixed  . Hyperlipidemia   . Hypertension   . MI (myocardial infarction) ([place])   . Myocardial infarction ([place])   . Peripheral neuropathy    calves and both feet  . Peripheral neuropathy    calves and both feet  . Rhabdomyolysis 02/02/2019  . Severe sepsis with acute organ dysfunction ([place]) 12/03/2016  . Spondylitis, ankylosing ([place])     Past Surgical History:  Procedure Laterality Date  . CARDIAC CATHETERIZATION     stent RCA June3, 2002, Stent OM/PTCA circumflex August 13, 2000  . CORONARY ANGIOPLASTY WITH STENT PLACEMENT     first one in 2006; had another place 3 months ago (August 2013)  . CORONARY ANGIOPLASTY WITH STENT PLACEMENT  03/26/2013   RCA           DR COOPER  . CORONARY ANGIOPLASTY WITH  STENT PLACEMENT    . CORONARY ARTERY BYPASS GRAFT N/A 10/29/2013   Procedure: CORONARY ARTERY BYPASS GRAFTING x 4 (LIMA-LAD, SVG-OM, SVG-PD-PL) ENDOSCOPIC VEIN HARVEST RIGHT THIGH;  Surgeon: Gaye Pollack, MD;  Location: [facility] OR;  Service: Open Heart Surgery;  Laterality: N/A;  . CORONARY ARTERY BYPASS GRAFT    . FINGER SURGERY     5  TH     DIGIT RIGHT HAND  . FINGER SURGERY    . INTRAOPERATIVE TRANSESOPHAGEAL ECHOCARDIOGRAM N/A 10/29/2013   Procedure: INTRAOPERATIVE TRANSESOPHAGEAL ECHOCARDIOGRAM;  Surgeon: Gaye Pollack, MD;  Location: [facility] OR;  Service: Open Heart Surgery;  Laterality: N/A;  . KNEE ARTHROSCOPY W/ LASER  2003  . LEFT HEART CATHETERIZATION WITH CORONARY ANGIOGRAM N/A 10/01/2011   Procedure: LEFT HEART CATHETERIZATION WITH CORONARY ANGIOGRAM;  Surgeon: [name], MD;  Location: [facility] CATH LAB;  Service: Cardiovascular;  Laterality: N/A;  . LEFT HEART CATHETERIZATION WITH CORONARY ANGIOGRAM N/A 03/26/2013   Procedure: LEFT HEART CATHETERIZATION WITH CORONARY ANGIOGRAM;  Surgeon: Blane Ohara, MD;  Location: [facility] CATH LAB;  Service: Cardiovascular;  Laterality: N/A;  . LEFT HEART CATHETERIZATION WITH CORONARY ANGIOGRAM N/A 10/25/2013   Procedure: LEFT HEART CATHETERIZATION WITH CORONARY ANGIOGRAM;  Surgeon: Legrand Como  Ree Kida, MD;  Location: [facility] CATH LAB;  Service: Cardiovascular;  Laterality: N/A;    There were no vitals filed for this visit.  Subjective Assessment - 08/18/19 1334    Subjective  Pt. reports quads were very sore after exercises last session. Back pain not too bad at 1/10 this PM.                        [facility] Adult PT Treatment/Exercise - 08/18/19 0001      Lumbar Exercises: Stretches   Passive Hamstring Stretch  Right;Left;3 reps;20 seconds    Piriformis Stretch  Right;Left;2 reps;30 seconds      Lumbar Exercises: Aerobic   Nustep  L5 x 5 min UE/LE      Lumbar Exercises: Standing   Row Limitations  Freemotion cable row 3x10 with 10 lbs.     Shoulder Extension Limitations  Freemotion cable ext 10 lbs. 2x10    Other Standing Lumbar Exercises  Standing Theraband diagonal green band 2x10      Lumbar Exercises: Supine   Dead Bug  15 reps    Bridge with clamshell  20 reps   Green band     Manual Therapy   Joint Mobilization  LAD bilat. grades I-III oscillations       Trigger Point Dry Needling - 08/18/19 0001    Consent Given?  Yes    Muscles Treated Back/Hip  Gluteus maximus;Erector spinae;Lumbar multifidi;Quadratus lumborum    Dry Needling Comments  needling bilat. in prone with 32 gauge 50 mm needles    Electrical Stimulation Performed with Dry Needling  Yes    E-stim with Dry Needling Details  TENS 2 pps x 10 min           PT Education - 08/18/19 1407    Education Details  exercises, delayed onset muscle soreness    Person(s) Educated  Patient    Methods  Explanation    Comprehension  Verbalized understanding          PT Long Term Goals - 08/03/19 0957      PT LONG TERM GOAL #1   Title  Independent with HEP    Baseline  needs HEP    Time  6    Period  Weeks    Status  New    Target Date  09/13/19      PT LONG TERM GOAL #2   Title  Improve FOTO outcome measure score to 31% or less impairment    Baseline  39% limited    Time  6    Period  Weeks    Status  New    Target Date  09/13/19      PT LONG TERM GOAL #3   Title  Tolerate stsanding for activities such as doing dishes periods at least 20-30 min without limitation due to LBP    Baseline  tolerance limited    Time  6    Period  Weeks    Status  New    Target Date  09/13/19      PT LONG TERM GOAL #4   Title  Perform community level ambulation for activities such as shopping and perform lifting for chores/IADLs without limitation due to LBP    Time  6    Period  Weeks    Status  New    Target Date  09/13/19            Plan - 08/18/19 1407  Clinical Impression Statement  Soreness reported in subjective consistent iwth delayed  onset muscle soreness from exercises last session. Pt. continues to respond well to tx. with decreased LBP from baseline status and improving functional status for positional tolerance for standing as well as walking tolerance.    Personal Factors and Comorbidities  Comorbidity 3+    Comorbidities  ankylosing spondylitis, history peripheral neuropathy, cardiac history, anxiety/depression    Examination-Activity Limitations  Stand;Lift;Carry;Locomotion Level    Examination-Participation Restrictions  Cleaning    Stability/Clinical Decision Making  Stable/Uncomplicated    Clinical Decision Making  Low    Rehab Potential  Good    PT Frequency  2x / week    PT Duration  6 weeks    PT Treatment/Interventions  ADLs/Self Care Home Management;Cryotherapy;Electrical Stimulation;Moist Heat;Therapeutic activities;Therapeutic exercise;Patient/family education;Manual techniques;Dry needling;Taping;Neuromuscular re-education;Functional mobility training    PT Next Visit Plan  check for innominate rotation and correct prn with METs, continue dry needling to QL (focus left) and lumbar paraspinals, STM prn, progression lumbopelvic stabilization and postural strengthening as tolerated    PT Home Exercise Plan  [id number]    Consulted and Agree with Plan of Care  Patient       Patient will benefit from skilled therapeutic intervention in order to improve the following deficits and impairments:  Pain, Postural dysfunction, Impaired flexibility, Decreased activity tolerance, Decreased range of motion  Visit Diagnosis: Chronic bilateral low back pain without sciatica  Abnormal posture     Problem List Patient Active Problem List   Diagnosis Date Noted  . Chronic bilateral low back pain without sciatica 07/13/2019  . Numbness and tingling in right hand 07/13/2019  . Fall at home, initial encounter 02/02/2019  . Leg cramping 12/22/2018  . Rash 05/23/2018  . Peripheral neuropathy 11/04/2017  . Gynecomastia,  male 02/18/2017  . Healthcare maintenance 01/20/2017  . Aortic atherosclerosis ([place]) 12/03/2016  . History of cryptosporidiosis   . S/P CABG x 4 10/29/2013  . Hyperlipidemia 06/15/2008  . Depression, major, single episode, mild ([place]) 06/15/2008  . Essential hypertension 06/15/2008  . CAD (coronary artery disease) 06/15/2008  . SPONDYLITIS, ANKYLOSING 06/15/2008  . Atherosclerotic heart disease of native coronary artery without angina pectoris 06/15/2008  . Gastro-esophageal reflux disease without esophagitis 06/15/2008  . Major depressive disorder, single episode 06/15/2008    Beaulah Dinning, PT, DPT 08/18/19 2:11 PM  [facility] [address] [place], [place], [address] Phone: [phone]   Fax:  [phone]  Name: Gregory Hansen MRN: [id number] Date of Birth: 01-31-1951

## 2019-08-23 ENCOUNTER — Encounter: Payer: Self-pay | Admitting: Physical Therapy

## 2019-08-23 ENCOUNTER — Other Ambulatory Visit: Payer: Self-pay

## 2019-08-23 ENCOUNTER — Ambulatory Visit: Payer: Medicare Other | Admitting: Physical Therapy

## 2019-08-23 DIAGNOSIS — M545 Low back pain, unspecified: Secondary | ICD-10-CM

## 2019-08-23 DIAGNOSIS — R293 Abnormal posture: Secondary | ICD-10-CM

## 2019-08-23 DIAGNOSIS — G8929 Other chronic pain: Secondary | ICD-10-CM | POA: Diagnosis not present

## 2019-08-23 NOTE — Therapy (Signed)
[place], [place], [address] Phone: [phone]   Fax:  [phone]  Physical Therapy Treatment  Patient Details  Name: Gregory Hansen MRN: [id number] Date of Birth: 1950/09/27 Referring Provider (PT): [name], DO, [name], MD   Encounter Date: 08/23/2019   PT End of Session - 08/23/19 1340    Visit Number 5    Number of Visits 12    Date for PT Re-Evaluation 09/13/19    Authorization Type UHC Medicare    PT Start Time 4481   pt. arrived late   PT Stop Time 1413    PT Time Calculation (min) 33 min    Activity Tolerance Patient tolerated treatment well    Behavior During Therapy [facility] for tasks assessed/performed           Past Medical History:  Diagnosis Date  . Acute hepatitis 01/26/2019  . Acute kidney failure ([place]) 03/30/2018  . AKI (acute kidney injury) ([place])   . Anginal pain ([place])   . Anxiety   . Arthritis    SPINE   . Arthritis   . Cellulitis 03/30/2018  . Coronary artery disease    s/p multiple percutaneous interventions, PCI 202 for DMI and DES distal RCA and CFX-OM 2006  . Coronary artery disease   . Depression   . GERD (gastroesophageal reflux disease)   . Hyperlipidemia    mixed  . Hyperlipidemia   . Hypertension   . MI (myocardial infarction) ([place])   . Myocardial infarction ([place])   . Peripheral neuropathy    calves and both feet  . Peripheral neuropathy    calves and both feet  . Rhabdomyolysis 02/02/2019  . Severe sepsis with acute organ dysfunction ([place]) 12/03/2016  . Spondylitis, ankylosing ([place])     Past Surgical History:  Procedure Laterality Date  . CARDIAC CATHETERIZATION     stent RCA June3, 2002, Stent OM/PTCA circumflex August 13, 2000  . CORONARY ANGIOPLASTY WITH STENT PLACEMENT     first one in 2006; had another place 3 months ago (August 2013)  . CORONARY ANGIOPLASTY WITH STENT PLACEMENT  03/26/2013   RCA           DR COOPER  . CORONARY  ANGIOPLASTY WITH STENT PLACEMENT    . CORONARY ARTERY BYPASS GRAFT N/A 10/29/2013   Procedure: CORONARY ARTERY BYPASS GRAFTING x 4 (LIMA-LAD, SVG-OM, SVG-PD-PL) ENDOSCOPIC VEIN HARVEST RIGHT THIGH;  Surgeon: Gaye Pollack, MD;  Location: [facility] OR;  Service: Open Heart Surgery;  Laterality: N/A;  . CORONARY ARTERY BYPASS GRAFT    . FINGER SURGERY     5  TH     DIGIT RIGHT HAND  . FINGER SURGERY    . INTRAOPERATIVE TRANSESOPHAGEAL ECHOCARDIOGRAM N/A 10/29/2013   Procedure: INTRAOPERATIVE TRANSESOPHAGEAL ECHOCARDIOGRAM;  Surgeon: Gaye Pollack, MD;  Location[facility] OR;  Service: Open Heart Surgery;  Laterality: N/A;  . KNEE ARTHROSCOPY W/ LASER  2003  . LEFT HEART CATHETERIZATION WITH CORONARY ANGIOGRAM N/A 10/01/2011   Procedure: LEFT HEART CATHETERIZATION WITH CORONARY ANGIOGRAM;  Surgeon: [name], MD;  Location: [facility] C[facility] LAB;  Service: Cardiovascular;  Laterality: N/A;  . LEFT HEART CATHETERIZATION WITH CORONARY ANGIOGRAM N/A 03/26/2013   Procedure: LEFT HEART CATHETERIZATION WITH CORONARY ANGIOGRAM;  Surgeon: Blane Ohara, MD;  Location[facility] CATH LAB;  Service: Cardiovascular;  Laterality: N/A;  . LEFT HEART CATHETERIZATION WITH CORONARY ANGIOGRAM N/A 10/25/2013   Procedure: LEFT HEART CATHETERIZATION WITH CORONARY ANGIOGRAM;  Surgeon: Legrand Como  Ree Kida, MD;  Location: [facility] CATH LAB;  Service: Cardiovascular;  Laterality: N/A;    There were no vitals filed for this visit.   Subjective Assessment - 08/23/19 1342    Subjective Pt. running a few minutes late for visit today-forgot credit card at home. Back did OK after last session but still having some pain on left side today just 1/10.    Pertinent History Ankylosing spondylitis, anxiety/depression, CAD, cardiac stents, MI, CABG 2015, RLE cellulitis, rhabdomyolysis, peripheral neuropathy                             [facility] Adult PT Treatment/Exercise - 08/23/19 0001      Lumbar Exercises: Stretches   Piriformis Stretch  Left;3 reps;30 seconds    Figure 4 Stretch 3 reps;30 seconds   left side     Lumbar Exercises: Aerobic   Nustep L5 x 5 min UE/LE      Lumbar Exercises: Standing   Row Limitations Freemotion cable row 13 lbs. 2x10    Shoulder Extension Limitations Freemotion cable ext 10 lbs. 2x10    Other Standing Lumbar Exercises Standing Theraband diagonal green band 2x10    Other Standing Lumbar Exercises Deadlift KB from 8 in step, 15 lbs. 2x10      Manual Therapy   Manual Therapy Soft tissue mobilization    Joint Mobilization LAD left hip grade I-III oscillations    Soft tissue mobilization left glut and lumbar paraspinals incl. roller use    Muscle Energy Technique left hamstring/right hip flexor isometrics 2 sets of 5 sec holds ea.                       PT Long Term Goals - 08/03/19 0957      PT LONG TERM GOAL #1   Title Independent with HEP    Baseline needs HEP    Time 6    Period Weeks    Status New    Target Date 09/13/19      PT LONG TERM GOAL #2   Title Improve FOTO outcome measure score to 31% or less impairment    Baseline 39% limited    Time 6    Period Weeks    Status New    Target Date 09/13/19      PT LONG TERM GOAL #3   Title Tolerate stsanding for activities such as doing dishes periods at least 20-30 min without limitation due to LBP    Baseline tolerance limited    Time 6    Period Weeks    Status New    Target Date 09/13/19      PT LONG TERM GOAL #4   Title Perform community level ambulation for activities such as shopping and perform lifting for chores/IADLs without limitation due to LBP    Time 6    Period Weeks    Status New    Target Date 09/13/19                 Plan - 08/23/19 1354    Clinical Impression Statement At visit 5 pt. noting moderate to signiifcant improvement in symptoms on right with fair to moderate improvement on left. Primary symptoms today in left glut and SI region so focused manual therapy these region with  tx. otherwise focused on exercise progression. Left anterior innominate rotation noted but able to correct with METs. Progressing re: therapy goals and plan recheck  FOTO next appointment.    Personal Factors and Comorbidities Comorbidity 3+    Comorbidities ankylosing spondylitis, history peripheral neuropathy, cardiac history, anxiety/depression    Examination-Activity Limitations Stand;Lift;Carry;Locomotion Level    Examination-Participation Restrictions Cleaning    Stability/Clinical Decision Making Stable/Uncomplicated    Clinical Decision Making Low    Rehab Potential Good    PT Frequency 2x / week    PT Duration 6 weeks    PT Treatment/Interventions ADLs/Self Care Home Management;Cryotherapy;Electrical Stimulation;Moist Heat;Therapeutic activities;Therapeutic exercise;Patient/family education;Manual techniques;Dry needling;Taping;Neuromuscular re-education;Functional mobility training    PT Next Visit Plan recheck FOTO, check for innominate rotation and correct prn with METs, continue dry needling to QL (focus left) and lumbar paraspinals, STM prn, progression lumbopelvic stabilization and postural strengthening as tolerated    PT Home Exercise Plan [email or web address]    Consulted and Agree with Plan of Care Patient           Patient will benefit from skilled therapeutic intervention in order to improve the following deficits and impairments:  Pain, Postural dysfunction, Impaired flexibility, Decreased activity tolerance, Decreased range of motion  Visit Diagnosis: Chronic bilateral low back pain without sciatica  Abnormal posture     Problem List Patient Active Problem List   Diagnosis Date Noted  . Chronic bilateral low back pain without sciatica 07/13/2019  . Numbness and tingling in right hand 07/13/2019  . Fall at home, initial encounter 02/02/2019  . Leg cramping 12/22/2018  . Rash 05/23/2018  . Peripheral neuropathy 11/04/2017  . Gynecomastia, male 02/18/2017  . Healthcare  maintenance 01/20/2017  . Aortic atherosclerosis ([place]) 12/03/2016  . History of cryptosporidiosis   . S/P CABG x 4 10/29/2013  . Hyperlipidemia 06/15/2008  . Depression, major, single episode, mild ([place]) 06/15/2008  . Essential hypertension 06/15/2008  . CAD (coronary artery disease) 06/15/2008  . SPONDYLITIS, ANKYLOSING 06/15/2008  . Atherosclerotic heart disease of native coronary artery without angina pectoris 06/15/2008  . Gastro-esophageal reflux disease without esophagitis 06/15/2008  . Major depressive disorder, single episode 06/15/2008    Beaulah Dinning, PT, DPT 08/23/19 2:21 PM  [facility] [address] [place], [place], [address] Phone: [phone]   Fax:  [phone]  Name: Gregory Hansen MRN: [id number] Date of Birth: Jul 31, 1950

## 2019-08-23 NOTE — Therapy (Signed)
[place], [place], [address] Phone: [phone]   Fax:  [phone]  Physical Therapy Treatment  Patient Details  Name: Gregory Hansen MRN: [id number] Date of Birth: [birth year] Referring Provider (PT): [name], DO, [name], MD   Encounter Date: 08/23/2019   PT End of Session - 08/23/19 1340    Visit Number 5    Number of Visits 12    Date for PT Re-Evaluation 09/13/19    Authorization Type UHC Medicare    PT Start Time 5284   pt. arrived late   PT Stop Time 1413    PT Time Calculation (min) 33 min    Activity Tolerance Patient tolerated treatment well    Behavior During Therapy [facility] for tasks assessed/performed           Past Medical History:  Diagnosis Date  . Acute hepatitis 01/26/2019  . Acute kidney failure ([place]) 03/30/2018  . AKI (acute kidney injury) ([place])   . Anginal pain ([place])   . Anxiety   . Arthritis    SPINE   . Arthritis   . Cellulitis 03/30/2018  . Coronary artery disease    s/p multiple percutaneous interventions, PCI 202 for DMI and DES distal RCA and CFX-OM 2006  . Coronary artery disease   . Depression   . GERD (gastroesophageal reflux disease)   . Hyperlipidemia    mixed  . Hyperlipidemia   . Hypertension   . MI (myocardial infarction) ([place])   . Myocardial infarction ([place])   . Peripheral neuropathy    calves and both feet  . Peripheral neuropathy    calves and both feet  . Rhabdomyolysis 02/02/2019  . Severe sepsis with acute organ dysfunction ([place]) 12/03/2016  . Spondylitis, ankylosing ([place])     Past Surgical History:  Procedure Laterality Date  . CARDIAC CATHETERIZATION     stent RCA June3, 2002, Stent OM/PTCA circumflex August 13, 2000  . CORONARY ANGIOPLASTY WITH STENT PLACEMENT     first one in 2006; had another place 3 months ago (August 2013)  . CORONARY ANGIOPLASTY WITH STENT PLACEMENT  03/26/2013   RCA           DR COOPER  . CORONARY  ANGIOPLASTY WITH STENT PLACEMENT    . CORONARY ARTERY BYPASS GRAFT N/A 10/29/2013   Procedure: CORONARY ARTERY BYPASS GRAFTING x 4 (LIMA-LAD, SVG-OM, SVG-PD-PL) ENDOSCOPIC VEIN HARVEST RIGHT THIGH;  Surgeon: Gaye Pollack, MD;  Location: [facility] OR;  Service: Open Heart Surgery;  Laterality: N/A;  . CORONARY ARTERY BYPASS GRAFT    . FINGER SURGERY     5  TH     DIGIT RIGHT HAND  . FINGER SURGERY    . INTRAOPERATIVE TRANSESOPHAGEAL ECHOCARDIOGRAM N/A 10/29/2013   Procedure: INTRAOPERATIVE TRANSESOPHAGEAL ECHOCARDIOGRAM;  Surgeon: Gaye Pollack, MD;  Location: [facility] OR;  Service: Open Heart Surgery;  Laterality: N/A;  . KNEE ARTHROSCOPY W/ LASER  2003  . LEFT HEART CATHETERIZATION WITH CORONARY ANGIOGRAM N/A 10/01/2011   Procedure: LEFT HEART CATHETERIZATION WITH CORONARY ANGIOGRAM;  Surgeon: [name], MD;  Location: [facility] CATH LAB;  Service: Cardiovascular;  Laterality: N/A;  . LEFT HEART CATHETERIZATION WITH CORONARY ANGIOGRAM N/A 03/26/2013   Procedure: LEFT HEART CATHETERIZATION WITH CORONARY ANGIOGRAM;  Surgeon: Blane Ohara, MD;  Location[facility] C[facility] LAB;  Service: Cardiovascular;  Laterality: N/A;  . LEFT HEART CATHETERIZATION WITH CORONARY ANGIOGRAM N/A 10/25/2013   Procedure: LEFT HEART CATHETERIZATION WITH CORONARY ANGIOGRAM;  Surgeon: Legrand Como  Ree Kida, MD;  Location: [facility] CATH LAB;  Service: Cardiovascular;  Laterality: N/A;    There were no vitals filed for this visit.   Subjective Assessment - 08/23/19 1342    Subjective Pt. running a few minutes late for visit today-forgot credit card at home. Back did OK after last session but still having some pain on left side today just 1/10.    Pertinent History Ankylosing spondylitis, anxiety/depression, CAD, cardiac stents, MI, CABG 2015, RLE cellulitis, rhabdomyolysis, peripheral neuropathy              [facility] PT Assessment - 08/23/19 0001      Palpation   SI assessment  left anterior innominate rotation                          [facility] Adult PT Treatment/Exercise - 08/23/19 0001      Lumbar Exercises: Stretches   Piriformis Stretch Left;3 reps;30 seconds    Figure 4 Stretch 3 reps;30 seconds   left side     Lumbar Exercises: Aerobic   Nustep L5 x 5 min UE/LE      Lumbar Exercises: Standing   Row Limitations Freemotion cable row 13 lbs. 2x10    Shoulder Extension Limitations Freemotion cable ext 10 lbs. 2x10    Other Standing Lumbar Exercises Standing Theraband diagonal green band 2x10    Other Standing Lumbar Exercises Deadlift KB from 8 in step, 15 lbs. 2x10      Manual Therapy   Manual Therapy Soft tissue mobilization    Joint Mobilization LAD left hip grade I-III oscillations    Soft tissue mobilization left glut and lumbar paraspinals incl. roller use    Muscle Energy Technique left hamstring/right hip flexor isometrics 2 sets of 5 sec holds ea.                       PT Long Term Goals - 08/03/19 0957      PT LONG TERM GOAL #1   Title Independent with HEP    Baseline needs HEP    Time 6    Period Weeks    Status New    Target Date 09/13/19      PT LONG TERM GOAL #2   Title Improve FOTO outcome measure score to 31% or less impairment    Baseline 39% limited    Time 6    Period Weeks    Status New    Target Date 09/13/19      PT LONG TERM GOAL #3   Title Tolerate stsanding for activities such as doing dishes periods at least 20-30 min without limitation due to LBP    Baseline tolerance limited    Time 6    Period Weeks    Status New    Target Date 09/13/19      PT LONG TERM GOAL #4   Title Perform community level ambulation for activities such as shopping and perform lifting for chores/IADLs without limitation due to LBP    Time 6    Period Weeks    Status New    Target Date 09/13/19                 Plan - 08/23/19 1354    Clinical Impression Statement At visit 5 pt. noting moderate to signiifcant improvement in symptoms on right  with fair to moderate improvement on left. Primary symptoms today in left glut and SI region so  focused manual therapy these region with tx. otherwise focused on exercise progression. Left anterior innominate rotation noted but able to correct with METs. Progressing re: therapy goals and plan recheck FOTO next appointment.    Personal Factors and Comorbidities Comorbidity 3+    Comorbidities ankylosing spondylitis, history peripheral neuropathy, cardiac history, anxiety/depression    Examination-Activity Limitations Stand;Lift;Carry;Locomotion Level    Examination-Participation Restrictions Cleaning    Stability/Clinical Decision Making Stable/Uncomplicated    Clinical Decision Making Low    Rehab Potential Good    PT Frequency 2x / week    PT Duration 6 weeks    PT Treatment/Interventions ADLs/Self Care Home Management;Cryotherapy;Electrical Stimulation;Moist Heat;Therapeutic activities;Therapeutic exercise;Patient/family education;Manual techniques;Dry needling;Taping;Neuromuscular re-education;Functional mobility training    PT Next Visit Plan recheck FOTO, check for innominate rotation and correct prn with METs, continue dry needling to QL (focus left) and lumbar paraspinals, STM prn, progression lumbopelvic stabilization and postural strengthening as tolerated    PT Home Exercise Plan [email or web address]    Consulted and Agree with Plan of Care Patient           Patient will benefit from skilled therapeutic intervention in order to improve the following deficits and impairments:  Pain, Postural dysfunction, Impaired flexibility, Decreased activity tolerance, Decreased range of motion  Visit Diagnosis: Chronic bilateral low back pain without sciatica  Abnormal posture     Problem List Patient Active Problem List   Diagnosis Date Noted  . Chronic bilateral low back pain without sciatica 07/13/2019  . Numbness and tingling in right hand 07/13/2019  . Fall at home, initial encounter  02/02/2019  . Leg cramping 12/22/2018  . Rash 05/23/2018  . Peripheral neuropathy 11/04/2017  . Gynecomastia, male 02/18/2017  . Healthcare maintenance 01/20/2017  . Aortic atherosclerosis ([place]) 12/03/2016  . History of cryptosporidiosis   . S/P CABG x 4 10/29/2013  . Hyperlipidemia 06/15/2008  . Depression, major, single episode, mild ([place]) 06/15/2008  . Essential hypertension 06/15/2008  . CAD (coronary artery disease) 06/15/2008  . SPONDYLITIS, ANKYLOSING 06/15/2008  . Atherosclerotic heart disease of native coronary artery without angina pectoris 06/15/2008  . Gastro-esophageal reflux disease without esophagitis 06/15/2008  . Major depressive disorder, single episode 06/15/2008    Beaulah Dinning, PT, DPT 08/23/19 2:22 PM  [facility] [address] [place], [place], [address] Phone: [phone]   Fax:  [phone]  Name: Gregory Hansen MRN: [id number] Date of Birth: [birth year]

## 2019-08-25 ENCOUNTER — Ambulatory Visit: Payer: Medicare Other | Admitting: Physical Therapy

## 2019-08-25 ENCOUNTER — Other Ambulatory Visit: Payer: Self-pay

## 2019-08-25 ENCOUNTER — Encounter: Payer: Self-pay | Admitting: Physical Therapy

## 2019-08-25 DIAGNOSIS — G8929 Other chronic pain: Secondary | ICD-10-CM

## 2019-08-25 DIAGNOSIS — R293 Abnormal posture: Secondary | ICD-10-CM

## 2019-08-25 DIAGNOSIS — M545 Low back pain: Secondary | ICD-10-CM | POA: Diagnosis not present

## 2019-08-25 NOTE — Therapy (Signed)
[place], [place], [address] Phone: [phone]   Fax:  [phone]  Physical Therapy Treatment  Patient Details  Name: Gregory Hansen MRN: [id number] Date of Birth: October 10, 1950 Referring Provider (PT): [name], DO, [name], MD   Encounter Date: 08/25/2019   PT End of Session - 08/25/19 1345    Visit Number 6    Number of Visits 12    Date for PT Re-Evaluation 09/13/19    Authorization Type UHC Medicare    PT Start Time 1325    PT Stop Time 1413    PT Time Calculation (min) 48 min    Activity Tolerance Patient tolerated treatment well    Behavior During Therapy [facility] for tasks assessed/performed           Past Medical History:  Diagnosis Date  . Acute hepatitis 01/26/2019  . Acute kidney failure ([place]) 03/30/2018  . AKI (acute kidney injury) ([place])   . Anginal pain ([place])   . Anxiety   . Arthritis    SPINE   . Arthritis   . Cellulitis 03/30/2018  . Coronary artery disease    s/p multiple percutaneous interventions, PCI 202 for DMI and DES distal RCA and CFX-OM 2006  . Coronary artery disease   . Depression   . GERD (gastroesophageal reflux disease)   . Hyperlipidemia    mixed  . Hyperlipidemia   . Hypertension   . MI (myocardial infarction) ([place])   . Myocardial infarction ([place])   . Peripheral neuropathy    calves and both feet  . Peripheral neuropathy    calves and both feet  . Rhabdomyolysis 02/02/2019  . Severe sepsis with acute organ dysfunction ([place]) 12/03/2016  . Spondylitis, ankylosing ([place])     Past Surgical History:  Procedure Laterality Date  . CARDIAC CATHETERIZATION     stent RCA June3, 2002, Stent OM/PTCA circumflex August 13, 2000  . CORONARY ANGIOPLASTY WITH STENT PLACEMENT     first one in 2006; had another place 3 months ago (August 2013)  . CORONARY ANGIOPLASTY WITH STENT PLACEMENT  03/26/2013   RCA           DR COOPER  . CORONARY ANGIOPLASTY WITH  STENT PLACEMENT    . CORONARY ARTERY BYPASS GRAFT N/A 10/29/2013   Procedure: CORONARY ARTERY BYPASS GRAFTING x 4 (LIMA-LAD, SVG-OM, SVG-PD-PL) ENDOSCOPIC VEIN HARVEST RIGHT THIGH;  Surgeon: Gaye Pollack, MD;  Location: [facility] OR;  Service: Open Heart Surgery;  Laterality: N/A;  . CORONARY ARTERY BYPASS GRAFT    . FINGER SURGERY     5  TH     DIGIT RIGHT HAND  . FINGER SURGERY    . INTRAOPERATIVE TRANSESOPHAGEAL ECHOCARDIOGRAM N/A 10/29/2013   Procedure: INTRAOPERATIVE TRANSESOPHAGEAL ECHOCARDIOGRAM;  Surgeon: Gaye Pollack, MD;  Location: [facility] OR;  Service: Open Heart Surgery;  Laterality: N/A;  . KNEE ARTHROSCOPY W/ LASER  2003  . LEFT HEART CATHETERIZATION WITH CORONARY ANGIOGRAM N/A 10/01/2011   Procedure: LEFT HEART CATHETERIZATION WITH CORONARY ANGIOGRAM;  Surgeon: [name], MD;  Location: [facility] CATH LAB;  Service: Cardiovascular;  Laterality: N/A;  . LEFT HEART CATHETERIZATION WITH CORONARY ANGIOGRAM N/A 03/26/2013   Procedure: LEFT HEART CATHETERIZATION WITH CORONARY ANGIOGRAM;  Surgeon: Blane Ohara, MD;  Location: [facility] CATH LAB;  Service: Cardiovascular;  Laterality: N/A;  . LEFT HEART CATHETERIZATION WITH CORONARY ANGIOGRAM N/A 10/25/2013   Procedure: LEFT HEART CATHETERIZATION WITH CORONARY ANGIOGRAM;  Surgeon: Blane Ohara, MD;  Location: [facility] CATH LAB;  Service: Cardiovascular;  Laterality: N/A;    There were no vitals filed for this visit.   Subjective Assessment - 08/25/19 1326    Subjective No pain immediately pre-tx. but still with intermittent left-side predominate lumbar symptoms. No major soreness after last visit.    Pertinent History Ankylosing spondylitis, anxiety/depression, CAD, cardiac stents, MI, CABG 2015, RLE cellulitis, rhabdomyolysis, peripheral neuropathy    Currently in Pain? No/denies              [facility] PT Assessment - 08/25/19 0001      Observation/Other Assessments   Focus on Therapeutic Outcomes (FOTO)  35% limited                          [facility] Adult PT Treatment/Exercise - 08/25/19 0001      Lumbar Exercises: Stretches   Passive Hamstring Stretch Right;Left;2 reps;30 seconds    Piriformis Stretch Left;3 reps;20 seconds    Figure 4 Stretch 3 reps;20 seconds   bilat.     Lumbar Exercises: Aerobic   Nustep L6 x 5 min UE/LE      Lumbar Exercises: Standing   Row Limitations Freemotion cable row 13 lbs. 3x10    Shoulder Extension Limitations Freemotion cable ext 10 lbs. 2x10    Other Standing Lumbar Exercises pall off press 10 lbs. x 15 ea. way    Other Standing Lumbar Exercises Deadlift KB from 8 in step, 15 lbs. 2x10      Lumbar Exercises: Supine   Bent Knee Raise 20 reps    Bridge with clamshell 20 reps   blue                 PT Education - 08/25/19 1405    Education Details estim    Person(s) Educated Patient    Methods Explanation    Comprehension Verbalized understanding               PT Long Term Goals - 08/25/19 1404      PT LONG TERM GOAL #1   Title Independent with HEP    Baseline met for initial HEP, will update prn    Time 6    Period Weeks    Status On-going      PT LONG TERM GOAL #2   Title Improve FOTO outcome measure score to 31% or less impairment    Baseline 35% limited    Time 6    Period Weeks    Status On-going      PT LONG TERM GOAL #3   Title Tolerate stsanding for activities such as doing dishes periods at least 20-30 min without limitation due to LBP    Baseline tolerance limited    Time 6    Period Weeks    Status On-going      PT LONG TERM GOAL #4   Title Perform community level ambulation for activities such as shopping and perform lifting for chores/IADLs without limitation due to LBP    Baseline ongoing    Time 6    Period Weeks    Status On-going                 Plan - 08/25/19 1405    Clinical Impression Statement Moderate progress from baseline status as evidenced by FOTO score improvement at visit 6 today.  Primary remaining pain in left lumbosacral and posterolateral hip region. Continued back/core strengthening exercises to address postural limitations with good  tolerance.    Personal Factors and Comorbidities Comorbidity 3+    Comorbidities ankylosing spondylitis, history peripheral neuropathy, cardiac history, anxiety/depression    Examination-Activity Limitations Stand;Lift;Carry;Locomotion Level    Examination-Participation Restrictions Cleaning    Stability/Clinical Decision Making Stable/Uncomplicated    Clinical Decision Making Low    Rehab Potential Good    PT Frequency 2x / week    PT Duration 6 weeks    PT Treatment/Interventions ADLs/Self Care Home Management;Cryotherapy;Electrical Stimulation;Moist Heat;Therapeutic activities;Therapeutic exercise;Patient/family education;Manual techniques;Dry needling;Taping;Neuromuscular re-education;Functional mobility training    PT Next Visit Plan recheck FOTO, check for innominate rotation and correct prn with METs, continue dry needling to QL (focus left) and lumbar paraspinals, STM prn, progression lumbopelvic stabilization and postural strengthening as tolerated    PT Home Exercise Plan [email or web address]    Consulted and Agree with Plan of Care Patient           Patient will benefit from skilled therapeutic intervention in order to improve the following deficits and impairments:  Pain, Postural dysfunction, Impaired flexibility, Decreased activity tolerance, Decreased range of motion  Visit Diagnosis: Chronic bilateral low back pain without sciatica  Abnormal posture     Problem List Patient Active Problem List   Diagnosis Date Noted  . Chronic bilateral low back pain without sciatica 07/13/2019  . Numbness and tingling in right hand 07/13/2019  . Fall at home, initial encounter 02/02/2019  . Leg cramping 12/22/2018  . Rash 05/23/2018  . Peripheral neuropathy 11/04/2017  . Gynecomastia, male 02/18/2017  . Healthcare maintenance  01/20/2017  . Aortic atherosclerosis ([place]) 12/03/2016  . History of cryptosporidiosis   . S/P CABG x 4 10/29/2013  . Hyperlipidemia 06/15/2008  . Depression, major, single episode, mild ([place]) 06/15/2008  . Essential hypertension 06/15/2008  . CAD (coronary artery disease) 06/15/2008  . SPONDYLITIS, ANKYLOSING 06/15/2008  . Atherosclerotic heart disease of native coronary artery without angina pectoris 06/15/2008  . Gastro-esophageal reflux disease without esophagitis 06/15/2008  . Major depressive disorder, single episode 06/15/2008    Beaulah Dinning, PT, DPT 08/25/19 2:08 PM  [facility] [address] [place], [place], [address] Phone: [phone]   Fax:  [phone]  Name: Gregory Hansen MRN: [id number] Date of Birth: [birth year]

## 2019-08-30 ENCOUNTER — Encounter: Payer: Self-pay | Admitting: Physical Therapy

## 2019-08-30 ENCOUNTER — Ambulatory Visit: Payer: Medicare Other | Admitting: Physical Therapy

## 2019-08-30 ENCOUNTER — Other Ambulatory Visit: Payer: Self-pay

## 2019-08-30 DIAGNOSIS — G8929 Other chronic pain: Secondary | ICD-10-CM

## 2019-08-30 DIAGNOSIS — R293 Abnormal posture: Secondary | ICD-10-CM | POA: Diagnosis not present

## 2019-08-30 DIAGNOSIS — M545 Low back pain: Secondary | ICD-10-CM | POA: Diagnosis not present

## 2019-08-30 NOTE — Therapy (Signed)
[place], [place], [address] Phone: [phone]   Fax:  [phone]  Physical Therapy Treatment  Patient Details  Name: Gregory Hansen MRN: [id number] Date of Birth: 06-27-1950 Referring Provider (PT): [name], DO, [name], MD   Encounter Date: 08/30/2019   PT End of Session - 08/30/19 1322    Visit Number 7    Number of Visits 12    Date for PT Re-Evaluation 09/13/19    Authorization Type UHC Medicare    PT Start Time 1320    PT Stop Time 1405    PT Time Calculation (min) 45 min    Activity Tolerance Patient tolerated treatment well    Behavior During Therapy [facility] for tasks assessed/performed           Past Medical History:  Diagnosis Date  . Acute hepatitis 01/26/2019  . Acute kidney failure ([place]) 03/30/2018  . AKI (acute kidney injury) ([place])   . Anginal pain ([place])   . Anxiety   . Arthritis    SPINE   . Arthritis   . Cellulitis 03/30/2018  . Coronary artery disease    s/p multiple percutaneous interventions, PCI 202 for DMI and DES distal RCA and CFX-OM 2006  . Coronary artery disease   . Depression   . GERD (gastroesophageal reflux disease)   . Hyperlipidemia    mixed  . Hyperlipidemia   . Hypertension   . MI (myocardial infarction) ([place])   . Myocardial infarction ([place])   . Peripheral neuropathy    calves and both feet  . Peripheral neuropathy    calves and both feet  . Rhabdomyolysis 02/02/2019  . Severe sepsis with acute organ dysfunction ([place]) 12/03/2016  . Spondylitis, ankylosing ([place])     Past Surgical History:  Procedure Laterality Date  . CARDIAC CATHETERIZATION     stent RCA June3, 2002, Stent OM/PTCA circumflex August 13, 2000  . CORONARY ANGIOPLASTY WITH STENT PLACEMENT     first one in 2006; had another place 3 months ago (August 2013)  . CORONARY ANGIOPLASTY WITH STENT PLACEMENT  03/26/2013   RCA           DR COOPER  . CORONARY ANGIOPLASTY WITH  STENT PLACEMENT    . CORONARY ARTERY BYPASS GRAFT N/A 10/29/2013   Procedure: CORONARY ARTERY BYPASS GRAFTING x 4 (LIMA-LAD, SVG-OM, SVG-PD-PL) ENDOSCOPIC VEIN HARVEST RIGHT THIGH;  Surgeon: Gaye Pollack, MD;  Location: [place] OR;  Service: Open Heart Surgery;  Laterality: N/A;  . CORONARY ARTERY BYPASS GRAFT    . FINGER SURGERY     5  TH     DIGIT RIGHT HAND  . FINGER SURGERY    . INTRAOPERATIVE TRANSESOPHAGEAL ECHOCARDIOGRAM N/A 10/29/2013   Procedure: INTRAOPERATIVE TRANSESOPHAGEAL ECHOCARDIOGRAM;  Surgeon: Gaye Pollack, MD;  Location: [facility] OR;  Service: Open Heart Surgery;  Laterality: N/A;  . KNEE ARTHROSCOPY W/ LASER  2003  . LEFT HEART CATHETERIZATION WITH CORONARY ANGIOGRAM N/A 10/01/2011   Procedure: LEFT HEART CATHETERIZATION WITH CORONARY ANGIOGRAM;  Surgeon: [name], MD;  Location: [facility] CATH LAB;  Service: Cardiovascular;  Laterality: N/A;  . LEFT HEART CATHETERIZATION WITH CORONARY ANGIOGRAM N/A 03/26/2013   Procedure: LEFT HEART CATHETERIZATION WITH CORONARY ANGIOGRAM;  Surgeon: Blane Ohara, MD;  Location: [facility] CATH LAB;  Service: Cardiovascular;  Laterality: N/A;  . LEFT HEART CATHETERIZATION WITH CORONARY ANGIOGRAM N/A 10/25/2013   Procedure: LEFT HEART CATHETERIZATION WITH CORONARY ANGIOGRAM;  Surgeon: Blane Ohara, MD;  Location: [place] CATH LAB;  Service: Cardiovascular;  Laterality: N/A;    There were no vitals filed for this visit.   Subjective Assessment - 08/30/19 1318    Subjective Left side of low back still "acting up" intermittently but reports less pain overall. No symptoms on right side. Pain is worse with standing at the sink to do dishes, pain not as severe "moving around." Pt. does report fell in the shower on Saturday and landed on left side-had some bruising on left lateral thigh region. No back pain/sy,symptoms or signiifcant leg pain noted.    Pertinent History Ankylosing spondylitis, anxiety/depression, CAD, cardiac stents, MI, CABG 2015, RLE cellulitis,  rhabdomyolysis, peripheral neuropathy    Diagnostic tests CT scan    Patient Stated Goals resolve back pain    Currently in Pain? No/denies              [facility] PT Assessment - 08/30/19 0001      Sensation   Additional Comments ecchymosis noted left lateral thigh region                         [facility] Adult PT Treatment/Exercise - 08/30/19 0001      Lumbar Exercises: Aerobic   Nustep L6 x 5 min UE/LE      Lumbar Exercises: Standing   Row Limitations Freemotion cable row 13 lbs. 3x10    Shoulder Extension Limitations Freemotion cable ext 10 lbs. 2x10    Other Standing Lumbar Exercises Deadlift KB from 8 in step, 15 lbs. 2x10      Lumbar Exercises: Supine   Dead Bug 20 reps    Bridge with Ball Squeeze 20 reps    Isometric Hip Flexion 10 reps;3 seconds      Manual Therapy   Soft tissue mobilization IASTM/foll roll left glut            Trigger Point Dry Needling - 08/30/19 0001    Consent Given? Yes    Muscles Treated Back/Hip Gluteus maximus    Dry Needling Comments needling to left glut in right sidelying with pillow between knees    Electrical Stimulation Performed with Dry Needling Yes    E-stim with Dry Needling Details TENS 2 pps x 10 min                PT Education - 08/30/19 1402    Education Details tennis ball use for self-STM    Person(s) Educated Patient    Methods Explanation    Comprehension Verbalized understanding               PT Long Term Goals - 08/25/19 1404      PT LONG TERM GOAL #1   Title Independent with HEP    Baseline met for initial HEP, will update prn    Time 6    Period Weeks    Status On-going      PT LONG TERM GOAL #2   Title Improve FOTO outcome measure score to 31% or less impairment    Baseline 35% limited    Time 6    Period Weeks    Status On-going      PT LONG TERM GOAL #3   Title Tolerate stsanding for activities such as doing dishes periods at least 20-30 min without limitation due to LBP     Baseline tolerance limited    Time 6    Period Weeks    Status On-going  PT LONG TERM GOAL #4   Title Perform community level ambulation for activities such as shopping and perform lifting for chores/IADLs without limitation due to LBP    Baseline ongoing    Time 6    Period Weeks    Status On-going                 Plan - 08/30/19 1337    Clinical Impression Statement Regarding fall no significant resulting pain or change in status noted/only finding was some bruising on lateral thigh so for now will just plan to monitor. Otherwise pt. making gradual improvements with decreased back pai and improving activity tolerance. Discussed use shower chair to prevent falls (patient has shower chair) given fall was in shower.    Personal Factors and Comorbidities Comorbidity 3+    Comorbidities ankylosing spondylitis, history peripheral neuropathy, cardiac history, anxiety/depression    Examination-Activity Limitations Stand;Lift;Carry;Locomotion Level    Examination-Participation Restrictions Cleaning    Stability/Clinical Decision Making Stable/Uncomplicated    Clinical Decision Making Low    Rehab Potential Good    PT Frequency 2x / week    PT Duration 6 weeks    PT Treatment/Interventions ADLs/Self Care Home Management;Cryotherapy;Electrical Stimulation;Moist Heat;Therapeutic activities;Therapeutic exercise;Patient/family education;Manual techniques;Dry needling;Taping;Neuromuscular re-education;Functional mobility training    PT Next Visit Plan monitor any symptoms s/p fall, check for innominate rotation and correct prn with METs, continue dry needling to QL (focus left) and lumbar paraspinals, STM prn, progression lumbopelvic stabilization and postural strengthening as tolerated           Patient will benefit from skilled therapeutic intervention in order to improve the following deficits and impairments:  Pain, Postural dysfunction, Impaired flexibility, Decreased activity  tolerance, Decreased range of motion  Visit Diagnosis: Chronic bilateral low back pain without sciatica  Abnormal posture     Problem List Patient Active Problem List   Diagnosis Date Noted  . Chronic bilateral low back pain without sciatica 07/13/2019  . Numbness and tingling in right hand 07/13/2019  . Fall at home, initial encounter 02/02/2019  . Leg cramping 12/22/2018  . Rash 05/23/2018  . Peripheral neuropathy 11/04/2017  . Gynecomastia, male 02/18/2017  . Healthcare maintenance 01/20/2017  . Aortic atherosclerosis ([place]) 12/03/2016  . History of cryptosporidiosis   . S/P CABG x 4 10/29/2013  . Hyperlipidemia 06/15/2008  . Depression, major, single episode, mild ([place]) 06/15/2008  . Essential hypertension 06/15/2008  . CAD (coronary artery disease) 06/15/2008  . SPONDYLITIS, ANKYLOSING 06/15/2008  . Atherosclerotic heart disease of native coronary artery without angina pectoris 06/15/2008  . Gastro-esophageal reflux disease without esophagitis 06/15/2008  . Major depressive disorder, single episode 06/15/2008    Beaulah Dinning, PT, DPT 08/30/19 2:03 PM  [facility] [address] [place], [place], [address] Phone: [phone]   Fax:  [phone]  Name: CORDARRIUS COAD MRN: [id number] Date of Birth: 1950-08-18

## 2019-09-01 ENCOUNTER — Ambulatory Visit: Payer: Medicare Other | Admitting: Physical Therapy

## 2019-09-01 ENCOUNTER — Other Ambulatory Visit: Payer: Self-pay

## 2019-09-01 ENCOUNTER — Encounter: Payer: Self-pay | Admitting: Physical Therapy

## 2019-09-01 DIAGNOSIS — R293 Abnormal posture: Secondary | ICD-10-CM

## 2019-09-01 DIAGNOSIS — M545 Low back pain: Secondary | ICD-10-CM | POA: Diagnosis not present

## 2019-09-01 DIAGNOSIS — G8929 Other chronic pain: Secondary | ICD-10-CM

## 2019-09-01 NOTE — Therapy (Signed)
[place], [place], [address] Phone: [phone]   Fax:  [phone]  Physical Therapy Treatment  Patient Details  Name: Gregory Hansen MRN: [id number] Date of Birth: 04-15-1950 Referring Provider (PT): [name], DO, [name], MD   Encounter Date: 09/01/2019   PT End of Session - 09/01/19 1318    Visit Number 8    Number of Visits 12    Date for PT Re-Evaluation 09/13/19    Authorization Type UHC Medicare, progress note by visit 10    PT Start Time 1315    PT Stop Time 1400    PT Time Calculation (min) 45 min    Activity Tolerance Patient tolerated treatment well    Behavior During Therapy [facility] for tasks assessed/performed           Past Medical History:  Diagnosis Date  . Acute hepatitis 01/26/2019  . Acute kidney failure ([place]) 03/30/2018  . AKI (acute kidney injury) ([place])   . Anginal pain ([place])   . Anxiety   . Arthritis    SPINE   . Arthritis   . Cellulitis 03/30/2018  . Coronary artery disease    s/p multiple percutaneous interventions, PCI 202 for DMI and DES distal RCA and CFX-OM 2006  . Coronary artery disease   . Depression   . GERD (gastroesophageal reflux disease)   . Hyperlipidemia    mixed  . Hyperlipidemia   . Hypertension   . MI (myocardial infarction) ([place])   . Myocardial infarction ([place])   . Peripheral neuropathy    calves and both feet  . Peripheral neuropathy    calves and both feet  . Rhabdomyolysis 02/02/2019  . Severe sepsis with acute organ dysfunction ([place]) 12/03/2016  . Spondylitis, ankylosing ([place])     Past Surgical History:  Procedure Laterality Date  . CARDIAC CATHETERIZATION     stent RCA June3, 2002, Stent OM/PTCA circumflex August 13, 2000  . CORONARY ANGIOPLASTY WITH STENT PLACEMENT     first one in 2006; had another place 3 months ago (August 2013)  . CORONARY ANGIOPLASTY WITH STENT PLACEMENT  03/26/2013   RCA           DR COOPER  .  CORONARY ANGIOPLASTY WITH STENT PLACEMENT    . CORONARY ARTERY BYPASS GRAFT N/A 10/29/2013   Procedure: CORONARY ARTERY BYPASS GRAFTING x 4 (LIMA-LAD, SVG-OM, SVG-PD-PL) ENDOSCOPIC VEIN HARVEST RIGHT THIGH;  Surgeon: Gaye Pollack, MD;  Location: [place] OR;  Service: Open Heart Surgery;  Laterality: N/A;  . CORONARY ARTERY BYPASS GRAFT    . FINGER SURGERY     5  TH     DIGIT RIGHT HAND  . FINGER SURGERY    . INTRAOPERATIVE TRANSESOPHAGEAL ECHOCARDIOGRAM N/A 10/29/2013   Procedure: INTRAOPERATIVE TRANSESOPHAGEAL ECHOCARDIOGRAM;  Surgeon: Gaye Pollack, MD;  Location: [facility] OR;  Service: Open Heart Surgery;  Laterality: N/A;  . KNEE ARTHROSCOPY W/ LASER  2003  . LEFT HEART CATHETERIZATION WITH CORONARY ANGIOGRAM N/A 10/01/2011   Procedure: LEFT HEART CATHETERIZATION WITH CORONARY ANGIOGRAM;  Surgeon: [name], MD;  Location: [facility] CATH LAB;  Service: Cardiovascular;  Laterality: N/A;  . LEFT HEART CATHETERIZATION WITH CORONARY ANGIOGRAM N/A 03/26/2013   Procedure: LEFT HEART CATHETERIZATION WITH CORONARY ANGIOGRAM;  Surgeon: Blane Ohara, MD;  Location: [facility] CATH LAB;  Service: Cardiovascular;  Laterality: N/A;  . LEFT HEART CATHETERIZATION WITH CORONARY ANGIOGRAM N/A 10/25/2013   Procedure: LEFT HEART CATHETERIZATION WITH CORONARY ANGIOGRAM;  Surgeon:  Blane Ohara, MD;  Location: [facility] CATH LAB;  Service: Cardiovascular;  Laterality: N/A;    There were no vitals filed for this visit.   Subjective Assessment - 09/01/19 1317    Subjective "Best I've felt" (since hurting back) after last session. Reports standing up "straighter" for posture.    Pertinent History Ankylosing spondylitis, anxiety/depression, CAD, cardiac stents, MI, CABG 2015, RLE cellulitis, rhabdomyolysis, peripheral neuropathy                             [facility] Adult PT Treatment/Exercise - 09/01/19 0001      Lumbar Exercises: Stretches   Passive Hamstring Stretch Right;Left;2 reps;30 seconds    Piriformis  Stretch Right;Left;2 reps;30 seconds      Lumbar Exercises: Aerobic   Nustep L6 x 5 min UE/LE      Lumbar Exercises: Standing   Row Limitations Freemotion cable row 17 lbs. 3x10    Shoulder Extension Limitations Freemotion cable ext 13 lbs. 2x10    Other Standing Lumbar Exercises Deadlift KB from 8 in step, 15 lbs. 2x10      Lumbar Exercises: Supine   Dead Bug 20 reps    Dead Bug Limitations 2 lb. weights ea. UE/LE    Bridge with [facility] 20 reps    Isometric Hip Flexion 15 reps;3 seconds            Trigger Point Dry Needling - 09/01/19 0001    Consent Given? Yes    Muscles Treated Back/Hip Gluteus maximus    Dry Needling Comments needling to left glut in right sidelying with pillow between knees    Electrical Stimulation Performed with Dry Needling Yes    E-stim with Dry Needling Details TENS 2 pps x 10 min                PT Education - 09/01/19 1351    Education Details POC    Person(s) Educated Patient    Methods Explanation    Comprehension Verbalized understanding               PT Long Term Goals - 08/25/19 1404      PT LONG TERM GOAL #1   Title Independent with HEP    Baseline met for initial HEP, will update prn    Time 6    Period Weeks    Status On-going      PT LONG TERM GOAL #2   Title Improve FOTO outcome measure score to 31% or less impairment    Baseline 35% limited    Time 6    Period Weeks    Status On-going      PT LONG TERM GOAL #3   Title Tolerate stsanding for activities such as doing dishes periods at least 20-30 min without limitation due to LBP    Baseline tolerance limited    Time 6    Period Weeks    Status On-going      PT LONG TERM GOAL #4   Title Perform community level ambulation for activities such as shopping and perform lifting for chores/IADLs without limitation due to LBP    Baseline ongoing    Time 6    Period Weeks    Status On-going                 Plan - 09/01/19 1352    Clinical  Impression Statement Good tx. response as noted in subjective and pt. continuing  to make progress re: therapy goals from baseline status with decreased pain and functional gains for postural and activity tolerance. Pt. is scheduled for 2 remaining visits next week so will determine further POC for d/c vs. recertification pending status next week.    Personal Factors and Comorbidities Comorbidity 3+    Comorbidities ankylosing spondylitis, history peripheral neuropathy, cardiac history, anxiety/depression    Examination-Activity Limitations Stand;Lift;Carry;Locomotion Level    Examination-Participation Restrictions Cleaning    Stability/Clinical Decision Making Stable/Uncomplicated    Clinical Decision Making Low    Rehab Potential Good    PT Frequency 2x / week    PT Duration 6 weeks    PT Treatment/Interventions ADLs/Self Care Home Management;Cryotherapy;Electrical Stimulation;Moist Heat;Therapeutic activities;Therapeutic exercise;Patient/family education;Manual techniques;Dry needling;Taping;Neuromuscular re-education;Functional mobility training    PT Next Visit Plan monitor any symptoms s/p fall, check for innominate rotation and correct prn with METs, continue dry needling to QL (focus left) and lumbar paraspinals, STM prn, progression lumbopelvic stabilization and postural strengthening as tolerated    PT Home Exercise Plan [id number]    Consulted and Agree with Plan of Care Patient           Patient will benefit from skilled therapeutic intervention in order to improve the following deficits and impairments:  Pain, Postural dysfunction, Impaired flexibility, Decreased activity tolerance, Decreased range of motion  Visit Diagnosis: Chronic bilateral low back pain without sciatica  Abnormal posture     Problem List Patient Active Problem List   Diagnosis Date Noted  . Chronic bilateral low back pain without sciatica 07/13/2019  . Numbness and tingling in right hand 07/13/2019  .  Fall at home, initial encounter 02/02/2019  . Leg cramping 12/22/2018  . Rash 05/23/2018  . Peripheral neuropathy 11/04/2017  . Gynecomastia, male 02/18/2017  . Healthcare maintenance 01/20/2017  . Aortic atherosclerosis ([place]) 12/03/2016  . History of cryptosporidiosis   . S/P CABG x 4 10/29/2013  . Hyperlipidemia 06/15/2008  . Depression, major, single episode, mild ([place]) 06/15/2008  . Essential hypertension 06/15/2008  . CAD (coronary artery disease) 06/15/2008  . SPONDYLITIS, ANKYLOSING 06/15/2008  . Atherosclerotic heart disease of native coronary artery without angina pectoris 06/15/2008  . Gastro-esophageal reflux disease without esophagitis 06/15/2008  . Major depressive disorder, single episode 06/15/2008   Beaulah Dinning, PT, DPT 09/01/19 1:56 PM  [facility] [address] [place], [place], [address] Phone: [phone]   Fax:  [phone]  Name: Gregory Hansen MRN: [id number] Date of Birth: 1950/11/17

## 2019-09-06 ENCOUNTER — Ambulatory Visit: Payer: Medicare Other | Admitting: Physical Therapy

## 2019-09-06 ENCOUNTER — Other Ambulatory Visit: Payer: Self-pay

## 2019-09-06 ENCOUNTER — Encounter: Payer: Self-pay | Admitting: Physical Therapy

## 2019-09-06 DIAGNOSIS — R293 Abnormal posture: Secondary | ICD-10-CM

## 2019-09-06 DIAGNOSIS — G8929 Other chronic pain: Secondary | ICD-10-CM | POA: Diagnosis not present

## 2019-09-06 DIAGNOSIS — M545 Low back pain, unspecified: Secondary | ICD-10-CM

## 2019-09-06 NOTE — Therapy (Signed)
[place], [place], [address] Phone: [phone]   Fax:  [phone]  Physical Therapy Treatment  Patient Details  Name: Gregory Hansen MRN: [id number] Date of Birth: 22-Jul-1950 Referring Provider (PT): [name], DO, [name], MD   Encounter Date: 09/06/2019   PT End of Session - 09/06/19 1455    Visit Number 9    Number of Visits 12    Date for PT Re-Evaluation 09/13/19    Authorization Type [employer] Medicare, progress note by visit 10    PT Start Time 1414    PT Stop Time 1458    PT Time Calculation (min) 44 min    Activity Tolerance Patient tolerated treatment well    Behavior During Therapy [facility] for tasks assessed/performed           Past Medical History:  Diagnosis Date  . Acute hepatitis 01/26/2019  . Acute kidney failure ([place]) 03/30/2018  . AKI (acute kidney injury) ([place])   . Anginal pain ([place])   . Anxiety   . Arthritis    SPINE   . Arthritis   . Cellulitis 03/30/2018  . Coronary artery disease    s/p multiple percutaneous interventions, PCI 202 for DMI and DES distal RCA and CFX-OM 2006  . Coronary artery disease   . Depression   . GERD (gastroesophageal reflux disease)   . Hyperlipidemia    mixed  . Hyperlipidemia   . Hypertension   . MI (myocardial infarction) ([place])   . Myocardial infarction ([place])   . Peripheral neuropathy    calves and both feet  . Peripheral neuropathy    calves and both feet  . Rhabdomyolysis 02/02/2019  . Severe sepsis with acute organ dysfunction ([place]) 12/03/2016  . Spondylitis, ankylosing ([place])     Past Surgical History:  Procedure Laterality Date  . CARDIAC CATHETERIZATION     stent RCA June3, 2002, Stent OM/PTCA circumflex August 13, 2000  . CORONARY ANGIOPLASTY WITH STENT PLACEMENT     first one in 2006; had another place 3 months ago (August 2013)  . CORONARY ANGIOPLASTY WITH STENT PLACEMENT  03/26/2013   RCA           DR COOPER  .  CORONARY ANGIOPLASTY WITH STENT PLACEMENT    . CORONARY ARTERY BYPASS GRAFT N/A 10/29/2013   Procedure: CORONARY ARTERY BYPASS GRAFTING x 4 (LIMA-LAD, SVG-OM, SVG-PD-PL) ENDOSCOPIC VEIN HARVEST RIGHT THIGH;  Surgeon: Gaye Pollack, MD;  Location: [facility] OR;  Service: Open Heart Surgery;  Laterality: N/A;  . CORONARY ARTERY BYPASS GRAFT    . FINGER SURGERY     5  TH     DIGIT RIGHT HAND  . FINGER SURGERY    . INTRAOPERATIVE TRANSESOPHAGEAL ECHOCARDIOGRAM N/A 10/29/2013   Procedure: INTRAOPERATIVE TRANSESOPHAGEAL ECHOCARDIOGRAM;  Surgeon: Gaye Pollack, MD;  Location: [facility] OR;  Service: Open Heart Surgery;  Laterality: N/A;  . KNEE ARTHROSCOPY W/ LASER  2003  . LEFT HEART CATHETERIZATION WITH CORONARY ANGIOGRAM N/A 10/01/2011   Procedure: LEFT HEART CATHETERIZATION WITH CORONARY ANGIOGRAM;  Surgeon: [name], MD;  Location[facility] C[facility] LAB;  Service: Cardiovascular;  Laterality: N/A;  . LEFT HEART CATHETERIZATION WITH CORONARY ANGIOGRAM N/A 03/26/2013   Procedure: LEFT HEART CATHETERIZATION WITH CORONARY ANGIOGRAM;  Surgeon: Blane Ohara, MD;  Location: [facility] CATH LAB;  Service: Cardiovascular;  Laterality: N/A;  . LEFT HEART CATHETERIZATION WITH CORONARY ANGIOGRAM N/A 10/25/2013   Procedure: LEFT HEART CATHETERIZATION WITH CORONARY ANGIOGRAM;  Surgeon:  Blane Ohara, MD;  Location: [facility] CATH LAB;  Service: Cardiovascular;  Laterality: N/A;    There were no vitals filed for this visit.   Subjective Assessment - 09/06/19 1452    Subjective Pt. noting some soreness in beck after doing some remodeling work for his shower over the weekend. Pain in left glut region 3-4/10.                             [facility] Adult PT Treatment/Exercise - 09/06/19 0001      Lumbar Exercises: Aerobic   Nustep L6 x 5 min UE/LE      Lumbar Exercises: Standing   Row Limitations Freemtion cable row 17 lbs. 3x10    Shoulder Extension Limitations Freemotion cable ext 13 lbs, 2x10    Other Standing Lumbar  Exercises Deadlift KB from 8 in step, 15 lbs. 2x10      Manual Therapy   Soft tissue mobilization STM/IASTM left posterolateral hip region            Trigger Point Dry Needling - 09/06/19 0001    Consent Given? Yes    Muscles Treated Back/Hip Gluteus maximus    Dry Needling Comments needling to left glut in right sidelying with pillow between knees with 30 gauge 50 mm needles    Electrical Stimulation Performed with Dry Needling Yes    E-stim with Dry Needling Details TENS 2 pps x 10 min                     PT Long Term Goals - 08/25/19 1404      PT LONG TERM GOAL #1   Title Independent with HEP    Baseline met for initial HEP, will update prn    Time 6    Period Weeks    Status On-going      PT LONG TERM GOAL #2   Title Improve FOTO outcome measure score to 31% or less impairment    Baseline 35% limited    Time 6    Period Weeks    Status On-going      PT LONG TERM GOAL #3   Title Tolerate stsanding for activities such as doing dishes periods at least 20-30 min without limitation due to LBP    Baseline tolerance limited    Time 6    Period Weeks    Status On-going      PT LONG TERM GOAL #4   Title Perform community level ambulation for activities such as shopping and perform lifting for chores/IADLs without limitation due to LBP    Baseline ongoing    Time 6    Period Weeks    Status On-going                 Plan - 09/06/19 1456    Clinical Impression Statement Mild setback with soreness as noted in subjective consistent with delayed onset muscle soreness but overall still progressing well. More manual focus today for STM left posterolateral hip region which was well-tolerated.    Personal Factors and Comorbidities Comorbidity 3+    Comorbidities ankylosing spondylitis, history peripheral neuropathy, cardiac history, anxiety/depression    Examination-Activity Limitations Stand;Lift;Carry;Locomotion Level    Examination-Participation  Restrictions Cleaning    Stability/Clinical Decision Making Stable/Uncomplicated    Clinical Decision Making Low    Rehab Potential Good    PT Frequency 2x / week    PT Duration 6 weeks  PT Treatment/Interventions ADLs/Self Care Home Management;Cryotherapy;Electrical Stimulation;Moist Heat;Therapeutic activities;Therapeutic exercise;Patient/family education;Manual techniques;Dry needling;Taping;Neuromuscular re-education;Functional mobility training    PT Next Visit Plan Progress note next visit-determine ERO vs. d/c pending status    PT Home Exercise Plan [id number]    Consulted and Agree with Plan of Care Patient           Patient will benefit from skilled therapeutic intervention in order to improve the following deficits and impairments:  Pain, Postural dysfunction, Impaired flexibility, Decreased activity tolerance, Decreased range of motion  Visit Diagnosis: Chronic bilateral low back pain without sciatica  Abnormal posture     Problem List Patient Active Problem List   Diagnosis Date Noted  . Chronic bilateral low back pain without sciatica 07/13/2019  . Numbness and tingling in right hand 07/13/2019  . Fall at home, initial encounter 02/02/2019  . Leg cramping 12/22/2018  . Rash 05/23/2018  . Peripheral neuropathy 11/04/2017  . Gynecomastia, male 02/18/2017  . Healthcare maintenance 01/20/2017  . Aortic atherosclerosis ([place]) 12/03/2016  . History of cryptosporidiosis   . S/P CABG x 4 10/29/2013  . Hyperlipidemia 06/15/2008  . Depression, major, single episode, mild ([place]) 06/15/2008  . Essential hypertension 06/15/2008  . CAD (coronary artery disease) 06/15/2008  . SPONDYLITIS, ANKYLOSING 06/15/2008  . Atherosclerotic heart disease of native coronary artery without angina pectoris 06/15/2008  . Gastro-esophageal reflux disease without esophagitis 06/15/2008  . Major depressive disorder, single episode 06/15/2008    Beaulah Dinning, PT, DPT 09/06/19 2:58  PM  [place] [facility] [address] [place], [place], [address] Phone: [phone]   Fax:  [phone]  Name: Gregory Hansen MRN: [id number] Date of Birth: 09/21/1950

## 2019-09-08 ENCOUNTER — Ambulatory Visit: Payer: Medicare Other | Admitting: Physical Therapy

## 2019-09-08 DIAGNOSIS — H2513 Age-related nuclear cataract, bilateral: Secondary | ICD-10-CM | POA: Diagnosis not present

## 2019-09-08 DIAGNOSIS — H43812 Vitreous degeneration, left eye: Secondary | ICD-10-CM | POA: Diagnosis not present

## 2019-09-15 ENCOUNTER — Encounter: Payer: Self-pay | Admitting: Family Medicine

## 2019-09-29 ENCOUNTER — Encounter: Payer: Self-pay | Admitting: Family Medicine

## 2019-10-01 ENCOUNTER — Ambulatory Visit (INDEPENDENT_AMBULATORY_CARE_PROVIDER_SITE_OTHER): Payer: Medicare Other | Admitting: Family Medicine

## 2019-10-01 ENCOUNTER — Other Ambulatory Visit: Payer: Self-pay

## 2019-10-01 DIAGNOSIS — I10 Essential (primary) hypertension: Secondary | ICD-10-CM | POA: Diagnosis not present

## 2019-10-01 NOTE — Patient Instructions (Addendum)
It was a pleasure to see you today!  Thank you for choosing [facility] for your primary care.  Gregory Hansen was seen for concern for elevated blood pressure.   Our plans for today were:  Continue to check your blood pressure twice daily at home.  Notify us if you continue to have blood pressures that are elevated greater than 170/100.  Also take note of any headaches or worsening blurry vision or chest pain or worsening shortness of breath, in the event that these symptoms persist it would be beneficial for you to go to the ED  We also discussed decreasing your alcohol intake to no more than 7 drinks per week as this could contribute to your elevated blood pressure measurements.  You should return to our clinic in 2 weeks for follow-up for your blood pressure with your PCP.  Best Wishes,   Dr. Alba Cory    Hypertension, Adult High blood pressure (hypertension) is when the force of blood pumping through the arteries is too strong. The arteries are the blood vessels that carry blood from the heart throughout the body. Hypertension forces the heart to work harder to pump blood and may cause arteries to become narrow or stiff. Untreated or uncontrolled hypertension can cause a heart attack, heart failure, a stroke, kidney disease, and other problems. A blood pressure reading consists of a higher number over a lower number. Ideally, your blood pressure should be below 120/80. The first ("top") number is called the systolic pressure. It is a measure of the pressure in your arteries as your heart beats. The second ("bottom") number is called the diastolic pressure. It is a measure of the pressure in your arteries as the heart relaxes. What are the causes? The exact cause of this condition is not known. There are some conditions that result in or are related to high blood pressure. What increases the risk? Some risk factors for high blood pressure are under your control. The  following factors may make you more likely to develop this condition:  Smoking.  Having type 2 diabetes mellitus, high cholesterol, or both.  Not getting enough exercise or physical activity.  Being overweight.  Having too much fat, sugar, calories, or salt (sodium) in your diet.  Drinking too much alcohol. Some risk factors for high blood pressure may be difficult or impossible to change. Some of these factors include:  Having chronic kidney disease.  Having a family history of high blood pressure.  Age. Risk increases with age.  Race. You may be at higher risk if you are African American.  Gender. Men are at higher risk than women before age 93. After age 59, women are at higher risk than men.  Having obstructive sleep apnea.  Stress. What are the signs or symptoms? High blood pressure may not cause symptoms. Very high blood pressure (hypertensive crisis) may cause:  Headache.  Anxiety.  Shortness of breath.  Nosebleed.  Nausea and vomiting.  Vision changes.  Severe chest pain.  Seizures. How is this diagnosed? This condition is diagnosed by measuring your blood pressure while you are seated, with your arm resting on a flat surface, your legs uncrossed, and your feet flat on the floor. The cuff of the blood pressure monitor will be placed directly against the skin of your upper arm at the level of your heart. It should be measured at least twice using the same arm. Certain conditions can cause a difference in blood pressure between your right and  left arms. Certain factors can cause blood pressure readings to be lower or higher than normal for a short period of time:  When your blood pressure is higher when you are in a health care provider's office than when you are at home, this is called white coat hypertension. Most people with this condition do not need medicines.  When your blood pressure is higher at home than when you are in a health care provider's  office, this is called masked hypertension. Most people with this condition may need medicines to control blood pressure. If you have a high blood pressure reading during one visit or you have normal blood pressure with other risk factors, you may be asked to:  Return on a different day to have your blood pressure checked again.  Monitor your blood pressure at home for 1 week or longer. If you are diagnosed with hypertension, you may have other blood or imaging tests to help your health care provider understand your overall risk for other conditions. How is this treated? This condition is treated by making healthy lifestyle changes, such as eating healthy foods, exercising more, and reducing your alcohol intake. Your health care provider may prescribe medicine if lifestyle changes are not enough to get your blood pressure under control, and if:  Your systolic blood pressure is above 130.  Your diastolic blood pressure is above 80. Your personal target blood pressure may vary depending on your medical conditions, your age, and other factors. Follow these instructions at home: Eating and drinking   Eat a diet that is high in fiber and potassium, and low in sodium, added sugar, and fat. An example eating plan is called the DASH (Dietary Approaches to Stop Hypertension) diet. To eat this way: ? Eat plenty of fresh fruits and vegetables. Try to fill one half of your plate at each meal with fruits and vegetables. ? Eat whole grains, such as whole-wheat pasta, brown rice, or whole-grain bread. Fill about one fourth of your plate with whole grains. ? Eat or drink low-fat dairy products, such as skim milk or low-fat yogurt. ? Avoid fatty cuts of meat, processed or cured meats, and poultry with skin. Fill about one fourth of your plate with lean proteins, such as fish, chicken without skin, beans, eggs, or tofu. ? Avoid pre-made and processed foods. These tend to be higher in sodium, added sugar, and  fat.  Reduce your daily sodium intake. Most people with hypertension should eat less than 1,500 mg of sodium a day.  Do not drink alcohol if: ? Your health care provider tells you not to drink. ? You are pregnant, may be pregnant, or are planning to become pregnant.  If you drink alcohol: ? Limit how much you use to:  0-1 drink a day for women.  0-2 drinks a day for men. ? Be aware of how much alcohol is in your drink. In the [place] one drink equals one 12 oz bottle of beer (355 mL), one 5 oz glass of wine (148 mL), or one 1 oz glass of hard liquor (44 mL). Lifestyle   Work with your health care provider to maintain a healthy body weight or to lose weight. Ask what an ideal weight is for you.  Get at least 30 minutes of exercise most days of the week. Activities may include walking, swimming, or biking.  Include exercise to strengthen your muscles (resistance exercise), such as Pilates or lifting weights, as part of your weekly exercise routine. Try  to do these types of exercises for 30 minutes at least 3 days a week.  Do not use any products that contain nicotine or tobacco, such as cigarettes, e-cigarettes, and chewing tobacco. If you need help quitting, ask your health care provider.  Monitor your blood pressure at home as told by your health care provider.  Keep all follow-up visits as told by your health care provider. This is important. Medicines  Take over-the-counter and prescription medicines only as told by your health care provider. Follow directions carefully. Blood pressure medicines must be taken as prescribed.  Do not skip doses of blood pressure medicine. Doing this puts you at risk for problems and can make the medicine less effective.  Ask your health care provider about side effects or reactions to medicines that you should watch for. Contact a health care provider if you:  Think you are having a reaction to a medicine you are taking.  Have headaches that  keep coming back (recurring).  Feel dizzy.  Have swelling in your ankles.  Have trouble with your vision. Get help right away if you:  Develop a severe headache or confusion.  Have unusual weakness or numbness.  Feel faint.  Have severe pain in your chest or abdomen.  Vomit repeatedly.  Have trouble breathing. Summary  Hypertension is when the force of blood pumping through your arteries is too strong. If this condition is not controlled, it may put you at risk for serious complications.  Your personal target blood pressure may vary depending on your medical conditions, your age, and other factors. For most people, a normal blood pressure is less than 120/80.  Hypertension is treated with lifestyle changes, medicines, or a combination of both. Lifestyle changes include losing weight, eating a healthy, low-sodium diet, exercising more, and limiting alcohol. This information is not intended to replace advice given to you by your health care provider. Make sure you discuss any questions you have with your health care provider. Document Revised: 11/05/2017 Document Reviewed: 11/05/2017 Elsevier Patient Education  2020 [employer].

## 2019-10-01 NOTE — Assessment & Plan Note (Addendum)
Patient encouraged to monitor salt intake and return for persistent HA, blurry vision, or blood pressure that persistently is elevated 829 or more systolic or 937J diastolic.  Patient agreeable with plan  - patient to continue home losartan 50mg  daily  -patient advised to decrease to no more than 7 drinks per week, decrease from 3-4 bourbons per night  - return precautions given

## 2019-10-01 NOTE — Progress Notes (Signed)
    SUBJECTIVE:   CHIEF COMPLAINT / HPI: concern for elevated BP   Elevated Home BP measurements  Noted changes in BP that occurred in the last 2 weeks  Has been monitoring BP with160-170s / 100 up to 135.  Reports intermittent, short lasting HA and has had 3-4 episodes in the last week Feels mild sense of SOB that is resolved when he takes a good deep breath, episdoes do not seem to be triggered by exertion and are not associated with HA  Denies any chest pain  Reports some night sweats so he sleeps with fan on  Denies cough or other symptoms  Drinks 3-4 bourbon every night   PERTINENT  PMH / PSH: Essential HTN   OBJECTIVE:   BP (!) 142/70 Comment: provider informed  Pulse 77   Temp 98.8 F (37.1 C) (Oral)   Ht 6\' 2"  (1.88 m)   Wt (!) 249 lb 4 oz (113.1 kg)   SpO2 98%   BMI 32.00 kg/m  Temp: 98.8  General: male appearing stated age in no acute distress Cardio: Normal S1 and S2, no S3 or S4. Rhythm is regular. Bilateral radial pulses palpable Pulm: Clear to auscultation bilaterally, no crackles, wheezing, or diminished breath sounds. Normal respiratory effort Abdomen: Bowel sounds normal. non-tender.  Extremities: Right lower extremity with venous stasis changes (area of graft for bypass surgery), trace edema on right lower extremity Neuro: pt alert and oriented x4   ASSESSMENT/PLAN:   HTN, goal below 140/90 Patient encouraged to monitor salt intake and return for persistent HA, blurry vision, or blood pressure that persistently is elevated 709 or more systolic or 643C diastolic.  Patient agreeable with plan  - patient to continue home losartan 50mg  daily  -patient advised to decrease to no more than 7 drinks per week, decrease from 3-4 bourbons per night  - return precautions given    Patient to follow up in two weeks with PCP for BP check   Eulis Foster, MD [place]

## 2019-10-04 ENCOUNTER — Telehealth: Payer: Self-pay | Admitting: Physical Therapy

## 2019-10-04 NOTE — Therapy (Signed)
[place], [place], [address] Phone: [phone]   Fax:  [phone]  Physical Therapy Treatment/Discharge  Patient Details  Name: Gregory Hansen MRN: [id number] Date of Birth: 09-30-1950 Referring Provider (PT): [name], DO, [name], MD   Encounter Date: 09/06/2019    Past Medical History:  Diagnosis Date  . Acute hepatitis 01/26/2019  . Acute kidney failure ([place]) 03/30/2018  . AKI (acute kidney injury) ([place])   . Anginal pain ([place])   . Anxiety   . Arthritis    SPINE   . Arthritis   . Cellulitis 03/30/2018  . Coronary artery disease    s/p multiple percutaneous interventions, PCI 202 for DMI and DES distal RCA and CFX-OM 2006  . Coronary artery disease   . Depression   . GERD (gastroesophageal reflux disease)   . Hyperlipidemia    mixed  . Hyperlipidemia   . Hypertension   . MI (myocardial infarction) ([place])   . Myocardial infarction ([place])   . Peripheral neuropathy    calves and both feet  . Peripheral neuropathy    calves and both feet  . Rhabdomyolysis 02/02/2019  . Severe sepsis with acute organ dysfunction ([place]) 12/03/2016  . Spondylitis, ankylosing ([place])     Past Surgical History:  Procedure Laterality Date  . CARDIAC CATHETERIZATION     stent RCA June3, 2002, Stent OM/PTCA circumflex August 13, 2000  . CORONARY ANGIOPLASTY WITH STENT PLACEMENT     first one in 2006; had another place 3 months ago (August 2013)  . CORONARY ANGIOPLASTY WITH STENT PLACEMENT  03/26/2013   RCA           DR COOPER  . CORONARY ANGIOPLASTY WITH STENT PLACEMENT    . CORONARY ARTERY BYPASS GRAFT N/A 10/29/2013   Procedure: CORONARY ARTERY BYPASS GRAFTING x 4 (LIMA-LAD, SVG-OM, SVG-PD-PL) ENDOSCOPIC VEIN HARVEST RIGHT THIGH;  Surgeon: Gaye Pollack, MD;  Location: [facility] OR;  Service: Open Heart Surgery;  Laterality: N/A;  . CORONARY ARTERY BYPASS GRAFT    . FINGER SURGERY     5  TH     DIGIT RIGHT  HAND  . FINGER SURGERY    . INTRAOPERATIVE TRANSESOPHAGEAL ECHOCARDIOGRAM N/A 10/29/2013   Procedure: INTRAOPERATIVE TRANSESOPHAGEAL ECHOCARDIOGRAM;  Surgeon: Gaye Pollack, MD;  Location: [facility] OR;  Service: Open Heart Surgery;  Laterality: N/A;  . KNEE ARTHROSCOPY W/ LASER  2003  . LEFT HEART CATHETERIZATION WITH CORONARY ANGIOGRAM N/A 10/01/2011   Procedure: LEFT HEART CATHETERIZATION WITH CORONARY ANGIOGRAM;  Surgeon: [name], MD;  Location: [facility] CATH LAB;  Service: Cardiovascular;  Laterality: N/A;  . LEFT HEART CATHETERIZATION WITH CORONARY ANGIOGRAM N/A 03/26/2013   Procedure: LEFT HEART CATHETERIZATION WITH CORONARY ANGIOGRAM;  Surgeon: Blane Ohara, MD;  Location: [facility] CATH LAB;  Service: Cardiovascular;  Laterality: N/A;  . LEFT HEART CATHETERIZATION WITH CORONARY ANGIOGRAM N/A 10/25/2013   Procedure: LEFT HEART CATHETERIZATION WITH CORONARY ANGIOGRAM;  Surgeon: Blane Ohara, MD;  Location: [facility] CATH LAB;  Service: Cardiovascular;  Laterality: N/A;    There were no vitals filed for this visit.                                   PT Long Term Goals - 08/25/19 1404      PT LONG TERM GOAL #1   Title Independent with HEP    Baseline met for initial HEP,  will update prn    Time 6    Period Weeks    Status On-going      PT LONG TERM GOAL #2   Title Improve FOTO outcome measure score to 31% or less impairment    Baseline 35% limited    Time 6    Period Weeks    Status On-going      PT LONG TERM GOAL #3   Title Tolerate stsanding for activities such as doing dishes periods at least 20-30 min without limitation due to LBP    Baseline tolerance limited    Time 6    Period Weeks    Status On-going      PT LONG TERM GOAL #4   Title Perform community level ambulation for activities such as shopping and perform lifting for chores/IADLs without limitation due to LBP    Baseline ongoing    Time 6    Period Weeks    Status On-going                   Patient will benefit from skilled therapeutic intervention in order to improve the following deficits and impairments:  Pain, Postural dysfunction, Impaired flexibility, Decreased activity tolerance, Decreased range of motion  Visit Diagnosis: Chronic bilateral low back pain without sciatica  Abnormal posture     Problem List Patient Active Problem List   Diagnosis Date Noted  . Chronic bilateral low back pain without sciatica 07/13/2019  . Numbness and tingling in right hand 07/13/2019  . Fall at home, initial encounter 02/02/2019  . Leg cramping 12/22/2018  . Rash 05/23/2018  . Peripheral neuropathy 11/04/2017  . Gynecomastia, male 02/18/2017  . Healthcare maintenance 01/20/2017  . Aortic atherosclerosis ([place]) 12/03/2016  . History of cryptosporidiosis   . S/P CABG x 4 10/29/2013  . Hyperlipidemia 06/15/2008  . Depression, major, single episode, mild ([place]) 06/15/2008  . HTN, goal below 140/90 06/15/2008  . CAD (coronary artery disease) 06/15/2008  . SPONDYLITIS, ANKYLOSING 06/15/2008  . Atherosclerotic heart disease of native coronary artery without angina pectoris 06/15/2008  . Gastro-esophageal reflux disease without esophagitis 06/15/2008  . Major depressive disorder, single episode 06/15/2008      PHYSICAL THERAPY DISCHARGE SUMMARY  Visits from Start of Care: 9  Current functional level related to goals / functional outcomes: Patient not seen for therapy since 09/06/19. He had been scheduled for session 09/08/19 but this was cancelled due to therapist out. Called patient to check on status and he is OK to d/c therapy at this point-will continue with HEP and follow up with MD as needed if needing new PT referral.  Remaining deficits: NA   Education / Equipment: HEP Plan: Patient agrees to discharge.  Patient goals were partially met. Patient is being discharged due to meeting the stated rehab goals.  ?????           Beaulah Dinning,  PT, DPT 10/04/19 12:41 PM      [facility] [address] [place], [place], [address] Phone: [phone]   Fax:  [phone]  Name: MAGDALENO LORTIE MRN: [id number] Date of Birth: [birth year]

## 2019-10-04 NOTE — Telephone Encounter (Signed)
Called patient to check on status for physical therapy as his last scheduled visit 09/08/19 had been cancelled due to therapist out. He reports still having some left posterolateral hip discomfort but overall doing well/limitations minimal so no further formal PT planned at this time. He will follow up with MD in the future if needing new PT referral.

## 2019-10-14 ENCOUNTER — Other Ambulatory Visit: Payer: Self-pay

## 2019-10-14 ENCOUNTER — Encounter: Payer: Self-pay | Admitting: Family Medicine

## 2019-10-14 ENCOUNTER — Ambulatory Visit (INDEPENDENT_AMBULATORY_CARE_PROVIDER_SITE_OTHER): Payer: Medicare Other | Admitting: Family Medicine

## 2019-10-14 VITALS — BP 160/78 | HR 68 | Ht 74.0 in | Wt 248.0 lb

## 2019-10-14 DIAGNOSIS — E785 Hyperlipidemia, unspecified: Secondary | ICD-10-CM

## 2019-10-14 DIAGNOSIS — F1010 Alcohol abuse, uncomplicated: Secondary | ICD-10-CM | POA: Diagnosis not present

## 2019-10-14 DIAGNOSIS — I10 Essential (primary) hypertension: Secondary | ICD-10-CM | POA: Diagnosis not present

## 2019-10-14 DIAGNOSIS — R6889 Other general symptoms and signs: Secondary | ICD-10-CM | POA: Diagnosis not present

## 2019-10-14 DIAGNOSIS — R7989 Other specified abnormal findings of blood chemistry: Secondary | ICD-10-CM | POA: Diagnosis not present

## 2019-10-14 HISTORY — DX: Alcohol abuse, uncomplicated: F10.10

## 2019-10-14 MED ORDER — METOPROLOL SUCCINATE ER 100 MG PO TB24: 100.0000 mg | ORAL_TABLET | Freq: Every day | ORAL | 3 refills | Status: DC

## 2019-10-14 NOTE — Assessment & Plan Note (Addendum)
-  Increased Metoprolol Succinate dose to 100mg  daily. -Referral to Dr. Valentina Lucks for ambulatory BP monitoring. If BP readings at home are controlled, may consider following up with me in 1 month instead. -ED precautions given for severe HTN as well as HoTN.  -Labs ordered: BMP, CBC, TSH

## 2019-10-14 NOTE — Assessment & Plan Note (Signed)
Currently on Rosuvastatin 20mg . Lab: Direct LDL ordered.

## 2019-10-14 NOTE — Progress Notes (Signed)
° ° °  SUBJECTIVE:   CHIEF COMPLAINT / HPI:   HTN: Patient has noticed elevated BP readings on his home monitors for just under 1 month that range from 120-171/77-109, though he has been asymptomatic. Patient had an office visit 2 weeks ago and in the office his measurement was 142/70. Today he brought in his BP cuff and it was measure against our manual checks and was found to read slightly higher reading 165/115 and the manual check was 160/78. He has had mild episodes of blurry vision upon waking though he does not check his BP then. Reports compliance with 50mg  Metoprolol daily and 50mg  Losartan daily. Patient does have signficant back pain, likely from his ankylosing spondylitis, though pain levels have been consistent and unchanged recently (likely not causing recent pressure elevations).  PERTINENT  PMH / PSH:  -Hyperlipidemia: currently on Rosuvastatin 20mg . -Alcohol misuse: currently drinks 2-3 servings of bourbon/night, which is decreased from 3-4 previously. Denies need for eye-opener or guilt. Is actively working to decrease alcohol amount. His daughter has voiced concerns with him and they have open communication about this subject.  -Ankylosing spondylitis: has a rheumatology appt in Sept, currently using Naproxen 500mg  QHS (though it is prescribed as BID) until his appointment.  OBJECTIVE:   BP (!) 160/78    Pulse 68    Ht 6\' 2"  (1.88 m)    Wt 248 lb (112.5 kg)    SpO2 98%    BMI 31.84 kg/m    Gen: patient NAD, sitting in clinic chair comfortably, well-appearing CV: RRR, no m/r/g, radial pulses 2+ Pulm: CTAB bilaterally, no crackles/wheezing/rhonci, normal respiratory effort Integumentary: notable large abrasions on bilateral forearms that are erythematous without discharge or swelling or signs of infection.   ASSESSMENT/PLAN:   HTN, goal below 140/90 -Increased Metoprolol Succinate dose to 100mg  daily. -Referral to Dr. Valentina Lucks for ambulatory BP monitoring. If BP readings at  home are controlled, may consider following up with me in 1 month instead. -ED precautions given for severe HTN as well as HoTN.  -Labs ordered: BMP, CBC, TSH  Alcohol use disorder, mild, abuse Patient drinking 2-3 servings of bourbon per night, denies need for "eye-opener" drink. Patient advised to decrease to no more than 1 serving per night. Discussed that this can contribute to HTN.  Lab: B12 level  Hyperlipidemia Currently on Rosuvastatin 20mg . Lab: Direct LDL ordered.     Gregory Hansen, [place]

## 2019-10-14 NOTE — Patient Instructions (Addendum)
It was so great to see you! Today we discussed the following:  Hypertension: your blood pressure cuff is reading mildly higher than our manual checks in the office and your pressures have a large range, I am referring you to our ambulatory blood pressure monitoring clinic with Dr. Valentina Lucks. We are increasing your Metoprolol 100mg . Continue taking your Losartan 50mg . Continue to monitoring your blood pressure at home and try to get a few checks in the morning after waking up. If you notice any of the following please go to the emergency department: BP >180/120 and have any of the following:  -chest or upper back pain -shortness of breath -severe headache -dizziness -numbness/weakness -loss of vision -difficulty speaking   Labs: today we are going to check your B12 level, kidney function, thyroid, as well as a lipid check.   Please don't hesitate to call or contact the office.  Best regards,  Dr. Oleh Genin, DO

## 2019-10-14 NOTE — Assessment & Plan Note (Addendum)
Patient drinking 2-3 servings of bourbon per night, denies need for "eye-opener" drink. Patient advised to decrease to no more than 1 serving per night. Discussed that this can contribute to HTN.  Lab: B12 level

## 2019-10-15 ENCOUNTER — Other Ambulatory Visit: Payer: Self-pay | Admitting: Family Medicine

## 2019-10-15 DIAGNOSIS — I10 Essential (primary) hypertension: Secondary | ICD-10-CM

## 2019-10-15 DIAGNOSIS — R7989 Other specified abnormal findings of blood chemistry: Secondary | ICD-10-CM

## 2019-10-15 LAB — BASIC METABOLIC PANEL WITH GFR
BUN/Creatinine Ratio: 12 (ref 10–24)
BUN: 10 mg/dL (ref 8–27)
CO2: 18 mmol/L — ABNORMAL LOW (ref 20–29)
Calcium: 8.8 mg/dL (ref 8.6–10.2)
Chloride: 102 mmol/L (ref 96–106)
Creatinine, Ser: 0.81 mg/dL (ref 0.76–1.27)
GFR calc Af Amer: 104 mL/min/1.73
GFR calc non Af Amer: 90 mL/min/1.73
Glucose: 106 mg/dL — ABNORMAL HIGH (ref 65–99)
Potassium: 4.1 mmol/L (ref 3.5–5.2)
Sodium: 136 mmol/L (ref 134–144)

## 2019-10-15 LAB — CBC WITH DIFFERENTIAL/PLATELET
Basophils Absolute: 0.1 x10E3/uL (ref 0.0–0.2)
Basos: 1 %
EOS (ABSOLUTE): 0.3 x10E3/uL (ref 0.0–0.4)
Eos: 9 %
Hematocrit: 37.9 % (ref 37.5–51.0)
Hemoglobin: 11.8 g/dL — ABNORMAL LOW (ref 13.0–17.7)
Immature Grans (Abs): 0 x10E3/uL (ref 0.0–0.1)
Immature Granulocytes: 0 %
Lymphocytes Absolute: 1.2 x10E3/uL (ref 0.7–3.1)
Lymphs: 32 %
MCH: 25.8 pg — ABNORMAL LOW (ref 26.6–33.0)
MCHC: 31.1 g/dL — ABNORMAL LOW (ref 31.5–35.7)
MCV: 83 fL (ref 79–97)
Monocytes Absolute: 0.5 x10E3/uL (ref 0.1–0.9)
Monocytes: 14 %
Neutrophils Absolute: 1.7 x10E3/uL (ref 1.4–7.0)
Neutrophils: 44 %
Platelets: 137 x10E3/uL — ABNORMAL LOW (ref 150–450)
RBC: 4.58 x10E6/uL (ref 4.14–5.80)
RDW: 17.3 % — ABNORMAL HIGH (ref 11.6–15.4)
WBC: 3.9 x10E3/uL (ref 3.4–10.8)

## 2019-10-15 LAB — LDL CHOLESTEROL, DIRECT: LDL Direct: 57 mg/dL (ref 0–99)

## 2019-10-15 LAB — VITAMIN B12: Vitamin B-12: 1087 pg/mL (ref 232–1245)

## 2019-10-15 LAB — TSH: TSH: 5.96 u[IU]/mL — ABNORMAL HIGH (ref 0.450–4.500)

## 2019-10-15 NOTE — Progress Notes (Signed)
Entered in error

## 2019-10-15 NOTE — Progress Notes (Signed)
Called and spoke with the patient about the recent lab results and the following topics: -Elevated TSH: 2 more tests (T3 and T4) were able to be ordered and will be communicated when they result. -LDL: great result and within goals -CBC: Hgb has had several low readings, consider an anemia work-up with iron panel at next visit with me. -B12 level is within normal limits.

## 2019-10-16 LAB — T4, FREE: Free T4: 1.21 ng/dL (ref 0.82–1.77)

## 2019-10-16 LAB — T3, FREE: T3, Free: 3.7 pg/mL (ref 2.0–4.4)

## 2019-10-25 ENCOUNTER — Other Ambulatory Visit: Payer: Self-pay

## 2019-10-25 ENCOUNTER — Encounter: Payer: Self-pay | Admitting: Pharmacist

## 2019-10-25 ENCOUNTER — Ambulatory Visit (INDEPENDENT_AMBULATORY_CARE_PROVIDER_SITE_OTHER): Payer: Medicare Other | Admitting: Pharmacist

## 2019-10-25 DIAGNOSIS — I10 Essential (primary) hypertension: Secondary | ICD-10-CM | POA: Diagnosis not present

## 2019-10-25 NOTE — Progress Notes (Signed)
S:    Patient arrives pleasant and in good spirits.  Presents to the clinic for ambulatory blood pressure evaluation. Patient reports recent blood pressure elevations in the past couple of months. Upon further discussion, patient reports taking ibuprofen 600 mg at night for pain due to Ankylosing Spondylitis. Additionally, patient is unsure of the brand of his guaifenesin, and cannot recall if the medication also contains pseudoephedrine. Patient was referred and last seen by Primary Care Provider on 10/14/2019. Patient reports ongoing pain related to Ankylosing Sponydylitis, for which he reports taking ibuprofen 600 mg QHS. States he has an upcoming appointment with rheumatology in September.   8/17: Today, patient arrives in good spirits for a follow-up and assessment after 24 hours of ambulatory care blood pressure monitoring. Patient reports having a stressful day yesterday due to car troubles, however was able to have a nice evening, with a period of rest from 9-10PM before going to sleep for the night. Patient brought to clinic his most recent allergy medication, which did not appear to contain pseudoephedrine.    Medication compliance is reported to be adherent.  Discussed procedure for wearing the monitor and gave patient written instructions. Monitor was placed on non-dominant arm with instructions to return in the morning.   Current BP Medications include:  Metoprolol succinate 100 mg daily and losartan 50 mg QHS  Antihypertensives tried in the past include: amlodipine 10 mg daily, isosorbide mononitrate 30 mg 24HR (0.5 tab) daily, ramipril 10 mg daily  Potential exacerbating medications per medication reconciliation: guaifenesin-pseudoephedrine filled 08/2019.   O:   Physical Exam Vitals reviewed.  Constitutional:      Appearance: Normal appearance.  Cardiovascular:     Rate and Rhythm: Normal rate.  Neurological:     Mental Status: He is alert.  Psychiatric:        Mood and  Affect: Mood normal.        Behavior: Behavior normal.        Thought Content: Thought content normal.        Judgment: Judgment normal.      Review of Systems  All other systems reviewed and are negative.   Last 3 Office BP readings: BP Readings from Last 3 Encounters:  10/14/19 (!) 160/78  10/01/19 (!) 142/70  08/11/19 130/74     Clinical Atherosclerotic Cardiovascular Disease (ASCVD): Yes  The 10-year ASCVD risk score Mikey Bussing DC Jr., et al., 2013) is: 51.5%   Values used to calculate the score:     Age: 50 years     Sex: Male     Is Non-Hispanic African American: No     Diabetic: Yes     Tobacco smoker: No     Systolic Blood Pressure: 956 mmHg     Is BP treated: Yes     HDL Cholesterol: 31 mg/dL     Total Cholesterol: 149 mg/dL  Basic Metabolic Panel    Component Value Date/Time   NA 136 10/14/2019 1221   K 4.1 10/14/2019 1221   CL 102 10/14/2019 1221   CO2 18 (L) 10/14/2019 1221   GLUCOSE 106 (H) 10/14/2019 1221   GLUCOSE 103 (H) 02/08/2019 0501   BUN 10 10/14/2019 1221   CREATININE 0.81 10/14/2019 1221   CREATININE 0.75 03/23/2015 0951   CALCIUM 8.8 10/14/2019 1221   GFRNONAA 90 10/14/2019 1221   GFRAA 104 10/14/2019 1221    Renal function: Estimated Creatinine Clearance: 111.7 mL/min (by C-G formula based on SCr of 0.81 mg/dL).  ABPM Study Data: Arm Placement left arm   Overall Mean 24hr BP:   160/86 mmHg HR: 73  Daytime Mean BP:  162/88 mmHg HR: 72  Nighttime Mean BP:  155/83 mmHg HR: 75  Dipping Pattern: No.  Sys:   4.2%   Dia: 5.3%   [normal dipping ~10-20%]  Non-hypertensive ABPM thresholds: daytime BP <125/75 mmHg, sleeptime BP <120/70 mmHg   PHQ-9 Score: 2; #10-not difficult at all    A/P: History longstanding hypertension found to have uncontrolled hypertension throughout the day given 24-hour ambulatory blood pressure which demonstrated an average blood pressure of 160/86 mmHg, without nocturnal dipping pattern.   Changes to  medications will include increasing losartan to maximum dose, as well as switching from metoprolol to carvedilol for further blood pressure lowering due to carvedilol's alpha effects. Patient advised to continue using home BP monitor, and keeping a daily log of values.  Patient also advised to avoid decongestants.  -Increase losartan to 100 mg daily.  -Switch from metoprolol succinate 100 mg daily to carvedilol 12.5 mg BID -Monitor BP at home daily and log values.   Results reviewed and written information provided.  Total time in face-to-face counseling 30 minutes.    F/U Clinic Visit with Dr. Valentina Lucks in ~3 weeks  Patient seen with Mickeal Skinner, PharmD Candidate, Rebbeca Paul, PharmD - PGY-1 Resident.

## 2019-10-25 NOTE — Patient Instructions (Addendum)
Nice seeing you today!  1. STOP taking metoprolol succinate.  2. START taking carvedilol 12.5 mg twice daily. 3. INCREASE losartan to 100 mg daily.   Continue to monitor your blood pressures at home and keep a log.   Do not take over the counter cough & cold medications that contain a "decongestant" such as pseudoephedrine.

## 2019-10-26 MED ORDER — CARVEDILOL 12.5 MG PO TABS: 12.5000 mg | ORAL_TABLET | Freq: Two times a day (BID) | ORAL | 3 refills | Status: DC

## 2019-10-26 MED ORDER — LOSARTAN POTASSIUM 100 MG PO TABS: 100.0000 mg | ORAL_TABLET | Freq: Every day | ORAL | 3 refills | Status: DC

## 2019-10-26 NOTE — Progress Notes (Signed)
Reviewed: Agree with Dr. Koval's documentation and management. 

## 2019-10-26 NOTE — Assessment & Plan Note (Signed)
History longstanding hypertension found to have uncontrolled hypertension throughout the day given 24-hour ambulatory blood pressure which demonstrated an average blood pressure of 160/86 mmHg, without nocturnal dipping pattern.   Changes to medications will include increasing losartan to maximum dose, as well as switching from metoprolol to carvedilol for further blood pressure lowering due to carvedilol's alpha effects. Patient advised to continue using home BP monitor, and keeping a daily log of values.  Patient also advised to avoid decongestants.  -Increase losartan to 100 mg daily.  -Switch from metoprolol succinate 100 mg daily to carvedilol 12.5 mg BID -Monitor BP at home daily and log values.

## 2019-10-27 NOTE — Progress Notes (Signed)
Office Visit Note  Patient: Gregory Hansen             Date of Birth: 1950-06-05           MRN: [id number]             PCP: Rise Patience, DO Referring: Zenia Resides, MD Visit Date: 11/10/2019 Occupation: '@GUAROCC' @  Subjective:  Lower back pain.   History of Present Illness: Gregory Hansen is a 69 y.o. male seen in consultation per request of Dr. Andria Frames.  According to the patient he started having some recurrent episodes of iritis when he was in his 20s.  He also had a lesion on his back which was diagnosed with morphea.  After that he was referred to a rheumatologist.  They did testing and his HLA-B27 came positive.  He has been having lower back pain since then.  He states for long time he slept in the lazy boy and now he has been using Tempur-Pedic bed where he can raise his bed.  He still has to get up in the middle of the night due to lower back pain.  He states he has not had an episode of iritis in the last 20 years.  He denies any history of Achilles tendinitis or plantar fasciitis.  He denies any history of joint swelling.  He states he gets some edema in his right lower extremity due to history of CABG.  He does see a dermatologist and has been told that he does not have psoriasis.  There is no family history of ankylosing spondylitis  Activities of Daily Living:  Patient reports morning stiffness for 0 minutes.   Patient Reports nocturnal pain.  Difficulty dressing/grooming: Denies Difficulty climbing stairs: Denies Difficulty getting out of chair: Denies Difficulty using hands for taps, buttons, cutlery, and/or writing: Denies  Review of Systems  Constitutional: Positive for fatigue. Negative for night sweats.  HENT: Positive for mouth dryness. Negative for mouth sores and nose dryness.   Eyes: Negative for redness, itching and dryness.  Respiratory: Positive for wheezing. Negative for shortness of breath and difficulty breathing.   Cardiovascular: Negative for chest  pain, palpitations, hypertension, irregular heartbeat and swelling in legs/feet.  Gastrointestinal: Positive for constipation. Negative for blood in stool and diarrhea.  Endocrine: Negative for increased urination.  Genitourinary: Negative for difficulty urinating.  Musculoskeletal: Positive for arthralgias, joint pain and joint swelling. Negative for myalgias, muscle weakness, morning stiffness, muscle tenderness and myalgias.  Skin: Positive for rash. Negative for color change, hair loss, nodules/bumps, redness, skin tightness, ulcers and sensitivity to sunlight.  Allergic/Immunologic: Negative for susceptible to infections.  Neurological: Positive for numbness and headaches. Negative for dizziness, fainting, memory loss, night sweats and weakness.  Hematological: Positive for bruising/bleeding tendency. Negative for swollen glands.  Psychiatric/Behavioral: Negative for depressed mood, confusion and sleep disturbance. The patient is not nervous/anxious.     PMFS History:  Patient Active Problem List   Diagnosis Date Noted  . Alcohol use disorder, mild, abuse 10/14/2019  . Chronic bilateral low back pain without sciatica 07/13/2019  . Numbness and tingling in right hand 07/13/2019  . Fall at home, initial encounter 02/02/2019  . Leg cramping 12/22/2018  . Rash 05/23/2018  . Peripheral neuropathy 11/04/2017  . Gynecomastia, male 02/18/2017  . Healthcare maintenance 01/20/2017  . Aortic atherosclerosis ([place]) 12/03/2016  . History of cryptosporidiosis   . S/P CABG x 4 10/29/2013  . Hyperlipidemia 06/15/2008  . Depression, major, single episode, mild ([place])  06/15/2008  . HTN, goal below 140/90 06/15/2008  . CAD (coronary artery disease) 06/15/2008  . SPONDYLITIS, ANKYLOSING 06/15/2008  . Atherosclerotic heart disease of native coronary artery without angina pectoris 06/15/2008  . Gastro-esophageal reflux disease without esophagitis 06/15/2008  . Major depressive disorder, single episode  06/15/2008    Past Medical History:  Diagnosis Date  . Acute hepatitis 01/26/2019  . Acute kidney failure ([place]) 03/30/2018  . AKI (acute kidney injury) ([place])   . Anginal pain ([place])   . Anxiety   . Arthritis    SPINE   . Arthritis   . Cellulitis 03/30/2018  . Coronary artery disease    s/p multiple percutaneous interventions, PCI 202 for DMI and DES distal RCA and CFX-OM 2006  . Coronary artery disease   . Depression   . GERD (gastroesophageal reflux disease)   . Hyperlipidemia    mixed  . Hyperlipidemia   . Hypertension   . MI (myocardial infarction) ([place])   . Myocardial infarction ([place])   . Peripheral neuropathy    calves and both feet  . Peripheral neuropathy    calves and both feet  . Rhabdomyolysis 02/02/2019  . Severe sepsis with acute organ dysfunction ([place]) 12/03/2016  . Spondylitis, ankylosing (HCC)     Family History  Problem Relation Age of Onset  . Breast cancer Mother 25  . Heart disease Father   . Heart disease Brother   . Leukemia Maternal Grandmother   . Heart disease Maternal Grandfather   . Heart disease Other        positive for cardiac disease and both brothers have had cardiac events  . Heart attack Brother    Past Surgical History:  Procedure Laterality Date  . CARDIAC CATHETERIZATION     stent RCA June3, 2002, Stent OM/PTCA circumflex August 13, 2000  . CORONARY ANGIOPLASTY WITH STENT PLACEMENT     first one in 2006; had another place 3 months ago (August 2013)  . CORONARY ANGIOPLASTY WITH STENT PLACEMENT  03/26/2013   RCA           DR COOPER  . CORONARY ANGIOPLASTY WITH STENT PLACEMENT    . CORONARY ARTERY BYPASS GRAFT N/A 10/29/2013   Procedure: CORONARY ARTERY BYPASS GRAFTING x 4 (LIMA-LAD, SVG-OM, SVG-PD-PL) ENDOSCOPIC VEIN HARVEST RIGHT THIGH;  Surgeon: Gaye Pollack, MD;  Location: [facility] OR;  Service: Open Heart Surgery;  Laterality: N/A;  . CORONARY ARTERY BYPASS GRAFT    . FINGER SURGERY     5  TH     DIGIT RIGHT HAND  . FINGER SURGERY      . INTRAOPERATIVE TRANSESOPHAGEAL ECHOCARDIOGRAM N/A 10/29/2013   Procedure: INTRAOPERATIVE TRANSESOPHAGEAL ECHOCARDIOGRAM;  Surgeon: Gaye Pollack, MD;  Location: [facility] OR;  Service: Open Heart Surgery;  Laterality: N/A;  . KNEE ARTHROSCOPY W/ LASER  2003  . LEFT HEART CATHETERIZATION WITH CORONARY ANGIOGRAM N/A 10/01/2011   Procedure: LEFT HEART CATHETERIZATION WITH CORONARY ANGIOGRAM;  Surgeon: [name], MD;  Location: [facility] CATH LAB;  Service: Cardiovascular;  Laterality: N/A;  . LEFT HEART CATHETERIZATION WITH CORONARY ANGIOGRAM N/A 03/26/2013   Procedure: LEFT HEART CATHETERIZATION WITH CORONARY ANGIOGRAM;  Surgeon: Blane Ohara, MD;  Location: [facility] CATH LAB;  Service: Cardiovascular;  Laterality: N/A;  . LEFT HEART CATHETERIZATION WITH CORONARY ANGIOGRAM N/A 10/25/2013   Procedure: LEFT HEART CATHETERIZATION WITH CORONARY ANGIOGRAM;  Surgeon: Blane Ohara, MD;  Location: [facility] CATH LAB;  Service: Cardiovascular;  Laterality: N/A;   Social History   Social  History Narrative   ** Merged History Encounter **       Lives in a one level home with son, and dog, Worden. Works out of the house. 2 steps into house. Smoke alarms in house. No grab bars in bathroom. Has some area rugs and throw rugs with backing.      No pets of his own. Restores antique pens, does like to walk and cycle but having problems with peripheral neuropathy.       Firearm is safely stored.      Wears seat belts in vehicle. Does use sunblock and tries to avoid sun due to vitiligo and previous sunburns.      Eats varied diet. Likes protein, fresh fruits and veggies. Watches fats, saturated fats. Drink a lot of water.    Immunization History  Administered Date(s) Administered  . Influenza, High Dose Seasonal PF 11/23/2016  . Influenza,inj,Quad PF,6+ Mos 12/18/2017, 11/20/2018  . PFIZER SARS-COV-2 Vaccination 05/03/2019, 05/24/2019  . Pneumococcal Conjugate-13 01/20/2017  . Pneumococcal Polysaccharide-23 07/01/2015,  04/01/2018  . Tdap 06/30/2015  . Zoster Recombinat (Shingrix) 06/28/2019, 09/13/2019     Objective: Vital Signs: BP (!) 157/95 (BP Location: Right Arm, Patient Position: Sitting, Cuff Size: Normal)   Pulse 71   Resp 17   Ht 5' 10.5" (1.791 m) Comment: patient not standing completely straight  Wt 248 lb 3.2 oz (112.6 kg)   BMI 35.11 kg/m    Physical Exam Vitals and nursing note reviewed.  Constitutional:      Appearance: He is well-developed.  HENT:     Head: Normocephalic and atraumatic.  Eyes:     Conjunctiva/sclera: Conjunctivae normal.     Pupils: Pupils are equal, round, and reactive to light.  Cardiovascular:     Rate and Rhythm: Normal rate and regular rhythm.     Heart sounds: Normal heart sounds.  Pulmonary:     Effort: Pulmonary effort is normal.     Breath sounds: Normal breath sounds.  Abdominal:     General: Bowel sounds are normal.     Palpations: Abdomen is soft.     Comments: Abdominal hernia  Musculoskeletal:     Cervical back: Normal range of motion and neck supple.  Skin:    General: Skin is warm and dry.     Capillary Refill: Capillary refill takes less than 2 seconds.     Comments: Scattered hypopigmented patches noted due to vitiligo.  Neurological:     Mental Status: He is alert and oriented to person, place, and time.  Psychiatric:        Behavior: Behavior normal.      Musculoskeletal Exam: He has limited range of motion of his cervical spine. He has thoracic kyphosis. He had limited range of motion of his lumbar spine. No SI joint tenderness was noted. Shoulder joints, elbow joints, wrist joints with good range of motion. He has bilateral fourth finger Dupuytren's contracture. He has bilateral CMC prominence. Hip  joints were and good range of motion. He had limited extension of his knee joints. He had some osteoarthritic changes in his feet with no synovitis. There was no evidence of plantar fasciitis or Achilles tendinitis.  CDAI Exam: CDAI  Score: -- Patient Global: --; Provider Global: -- Swollen: --; Tender: -- Joint Exam 11/10/2019   No joint exam has been documented for this visit   There is currently no information documented on the homunculus. Go to the Rheumatology activity and complete the homunculus joint exam.  Investigation: No  additional findings.  Imaging: XR Thoracic Spine 2 View  Result Date: 11/10/2019 AP view of thoracic spine was difficult to assess due to previous CABG. Lateral spine shows mild spondylosis no syndesmophytes were noted. Impression: Findings are consistent with multilevel spondylosis.  XR KNEE 3 VIEW LEFT  Result Date: 11/10/2019 Severe medial compartment narrowing was noted. Severe patellofemoral narrowing was noted. No chondrocalcinosis was noted. Impression: These findings are consistent with severe osteoarthritis and severe chondromalacia patella.  XR KNEE 3 VIEW RIGHT  Result Date: 11/10/2019 Moderate to severe medial compartment narrowing was noted. Moderate patellofemoral narrowing was noted. No chondrocalcinosis was noted. Impression: These findings are consistent with moderate to severe osteoarthritis and moderate chondromalacia patella.  XR Lumbar Spine 2-3 Views  Result Date: 11/10/2019 Old L1 vertebral fracture was noted. Multilevel spondylosis and facet joint arthropathy was noted. No syndesmophytes were noted. Levoscoliosis was noted. Impression: These findings are consistent with levoscoliosis, spondylosis, facet joint arthropathy and old L1 vertebral fracture.  XR Pelvis 1-2 Views  Result Date: 11/10/2019 Bilateral SI joints are completely fused. No hip joint narrowing was noted. Impression: Bilateral SI joints are completely fused   Recent Labs: Lab Results  Component Value Date   WBC 3.9 10/14/2019   HGB 11.8 (L) 10/14/2019   PLT 137 (L) 10/14/2019   NA 136 10/14/2019   K 4.1 10/14/2019   CL 102 10/14/2019   CO2 18 (L) 10/14/2019   GLUCOSE 106 (H) 10/14/2019    BUN 10 10/14/2019   CREATININE 0.81 10/14/2019   BILITOT 0.9 02/25/2019   ALKPHOS 141 (H) 02/25/2019   AST 30 02/25/2019   ALT 25 02/25/2019   PROT 7.0 02/25/2019   ALBUMIN 3.4 (L) 02/25/2019   CALCIUM 8.8 10/14/2019   GFRAA 104 10/14/2019    Speciality Comments: No specialty comments available.  Procedures:  No procedures performed Allergies: Patient has no known allergies.   Assessment / Plan:     Visit Diagnoses: Ankylosing spondylitis, unspecified site of spine (HCC)-patient states he was diagnosed with ankylosing spondylitis several years ago by rheumatologist and was HLA-B27 positive. He was advised to take Tylenol. He continues to have upper and lower back pain.  He is SI joints are completely fused.  I'm uncertain how much relief he will get from immunosuppressive therapy.  Different treatment options and their side effects were discussed.  Indications side effects contraindications of Enbrel were discussed.  I will obtain labs today.  History of iritis-he had recurrent iritis until 20 years ago but no episodes since then.  Pain in thoracic spine -he has thoracic kyphosis. Plan: XR Thoracic Spine 2 View. The x-rays were consistent with multilevel spondylosis. X-ray was not very well visualized due to previous surgery.  Chronic midline low back pain without sciatica -he complains of lower back pain. Plan: XR Lumbar Spine 2-3 Views. Squaring of vertebrae was noted. No significant disc space narrowing was noted. Facet joint arthropathy and scoliosis was noted. No syndesmophytes were noted.  Chronic SI joint pain -he had no SI joint tenderness on examination today. Plan: XR Pelvis 1-2 Views. SI joints are completely fused.  Chronic pain of both knees -he had limited extension of bilateral knee joints. Plan: XR KNEE 3 VIEW RIGHT, XR KNEE 3 VIEW LEFT he had bilateral severe osteoarthritis and bilateral severe chondromalacia patella.  History of vertebral fracture - CT lumbar  01/26/19: acute predominantly horizontally oriented fracture of L1 vertebral body. Fracture lucencies extend to L1 superior endplate. no height loss  Vitiligo-a vitiligo was  noted on the skin. He states he was diagnosed with morphea when he was young. I'm uncertain about the diagnosis. Have advised him to have his dermatologist evaluate him for morphea.  Essential hypertension  History of MI (myocardial infarction)  Coronary artery disease involving native coronary artery of native heart without angina pectoris  S/P CABG x 4  Gastro-esophageal reflux disease without esophagitis  Peripheral polyneuropathy  History of cryptosporidiosis - GI tract-2018.ttd with Abx  History of depression  History of alcohol use disorder - 4 glasses of wine daily  He is fully immunized against COVID-19. Use of mask, social distancing and hand hygiene was emphasized. He was also advised to get COVID-19 booster when available.  Orders: Orders Placed This Encounter  Procedures  . XR Pelvis 1-2 Views  . XR Lumbar Spine 2-3 Views  . XR Thoracic Spine 2 View  . XR KNEE 3 VIEW RIGHT  . XR KNEE 3 VIEW LEFT  . CBC with Differential/Platelet  . COMPLETE METABOLIC PANEL WITH GFR  . Sedimentation rate  . Hepatitis B core antibody, IgM  . Hepatitis B surface antigen  . Hepatitis C antibody  . HIV Antibody (routine testing w rflx)  . QuantiFERON-TB Gold Plus  . Serum protein electrophoresis with reflex  . IgG, IgA, IgM  . HLA-B27 antigen   No orders of the defined types were placed in this encounter.     Follow-Up Instructions: Return for Lower back pain.   Bo Merino, MD  Note - This record has been created using Editor, commissioning.  Chart creation errors have been sought, but may not always  have been located. Such creation errors do not reflect on  the standard of medical care.

## 2019-11-10 ENCOUNTER — Ambulatory Visit: Payer: Self-pay

## 2019-11-10 ENCOUNTER — Other Ambulatory Visit: Payer: Self-pay

## 2019-11-10 ENCOUNTER — Other Ambulatory Visit: Payer: Self-pay | Admitting: Family Medicine

## 2019-11-10 ENCOUNTER — Encounter: Payer: Self-pay | Admitting: Rheumatology

## 2019-11-10 ENCOUNTER — Ambulatory Visit: Payer: Medicare Other | Admitting: Rheumatology

## 2019-11-10 ENCOUNTER — Encounter: Payer: Self-pay | Admitting: Family Medicine

## 2019-11-10 ENCOUNTER — Telehealth: Payer: Self-pay | Admitting: Pharmacist

## 2019-11-10 VITALS — BP 157/95 | HR 71 | Resp 17 | Ht 70.5 in | Wt 248.2 lb

## 2019-11-10 DIAGNOSIS — G629 Polyneuropathy, unspecified: Secondary | ICD-10-CM

## 2019-11-10 DIAGNOSIS — M533 Sacrococcygeal disorders, not elsewhere classified: Secondary | ICD-10-CM

## 2019-11-10 DIAGNOSIS — G8929 Other chronic pain: Secondary | ICD-10-CM

## 2019-11-10 DIAGNOSIS — M459 Ankylosing spondylitis of unspecified sites in spine: Secondary | ICD-10-CM

## 2019-11-10 DIAGNOSIS — K219 Gastro-esophageal reflux disease without esophagitis: Secondary | ICD-10-CM

## 2019-11-10 DIAGNOSIS — M546 Pain in thoracic spine: Secondary | ICD-10-CM | POA: Diagnosis not present

## 2019-11-10 DIAGNOSIS — M545 Low back pain, unspecified: Secondary | ICD-10-CM

## 2019-11-10 DIAGNOSIS — Z79899 Other long term (current) drug therapy: Secondary | ICD-10-CM | POA: Diagnosis not present

## 2019-11-10 DIAGNOSIS — L80 Vitiligo: Secondary | ICD-10-CM

## 2019-11-10 DIAGNOSIS — Z1211 Encounter for screening for malignant neoplasm of colon: Secondary | ICD-10-CM

## 2019-11-10 DIAGNOSIS — Z8781 Personal history of (healed) traumatic fracture: Secondary | ICD-10-CM

## 2019-11-10 DIAGNOSIS — Z8659 Personal history of other mental and behavioral disorders: Secondary | ICD-10-CM

## 2019-11-10 DIAGNOSIS — Z951 Presence of aortocoronary bypass graft: Secondary | ICD-10-CM

## 2019-11-10 DIAGNOSIS — I10 Essential (primary) hypertension: Secondary | ICD-10-CM

## 2019-11-10 DIAGNOSIS — M25561 Pain in right knee: Secondary | ICD-10-CM | POA: Diagnosis not present

## 2019-11-10 DIAGNOSIS — Z8669 Personal history of other diseases of the nervous system and sense organs: Secondary | ICD-10-CM

## 2019-11-10 DIAGNOSIS — M25562 Pain in left knee: Secondary | ICD-10-CM | POA: Diagnosis not present

## 2019-11-10 DIAGNOSIS — Z8619 Personal history of other infectious and parasitic diseases: Secondary | ICD-10-CM

## 2019-11-10 DIAGNOSIS — Z87898 Personal history of other specified conditions: Secondary | ICD-10-CM

## 2019-11-10 DIAGNOSIS — I2510 Atherosclerotic heart disease of native coronary artery without angina pectoris: Secondary | ICD-10-CM

## 2019-11-10 DIAGNOSIS — I252 Old myocardial infarction: Secondary | ICD-10-CM

## 2019-11-10 NOTE — Telephone Encounter (Signed)
Submitted a Prior Authorization request to [facility] for ENBREL via Cover My Meds. Will update once we receive a response.   ([employer]) - [id number]

## 2019-11-10 NOTE — Telephone Encounter (Signed)
Received notification from [facility] regarding a prior authorization for ENBREL. Authorization has been APPROVED from 11/10/19 to 03/10/20.   Authorization # [id number]  Ran test claim, patient's copay for 1 month supply is $1,671.42. Patient will need to apply for [employer] PAP.

## 2019-11-10 NOTE — Telephone Encounter (Signed)
Please start benefits investigation for Enbrel for the treatment of AS.  Patient has tried and failed ibuprofen.     Mariella Saa, PharmD, [place], CPP Clinical Specialty Pharmacist (Rheumatology and Pulmonology)  11/10/2019 11:19 AM

## 2019-11-10 NOTE — Progress Notes (Signed)
Pharmacy Note  Subjective: Patient presents today to the [facility] Rheumatology for follow up office visit.  Patient seen by the pharmacist for counseling on Enbrel for ankylosing spondylitis.  He is naive to [facility] or biologics.  He is not a good candidate for MTX or Arava due to alcohol use and history of elevated LFT's/ alcohol related injury.  Objective:  CBC    Component Value Date/Time   WBC 3.9 10/14/2019 1221   WBC 6.1 02/08/2019 0502   RBC 4.58 10/14/2019 1221   RBC 3.00 (L) 02/08/2019 0502   HGB 11.8 (L) 10/14/2019 1221   HCT 37.9 10/14/2019 1221   PLT 137 (L) 10/14/2019 1221   MCV 83 10/14/2019 1221   MCH 25.8 (L) 10/14/2019 1221   MCH 31.3 02/08/2019 0502   MCHC 31.1 (L) 10/14/2019 1221   MCHC 31.2 02/08/2019 0502   RDW 17.3 (H) 10/14/2019 1221   LYMPHSABS 1.2 10/14/2019 1221   MONOABS 0.8 02/08/2019 0502   EOSABS 0.3 10/14/2019 1221   BASOSABS 0.1 10/14/2019 1221     CMP     Component Value Date/Time   NA 136 10/14/2019 1221   K 4.1 10/14/2019 1221   CL 102 10/14/2019 1221   CO2 18 (L) 10/14/2019 1221   GLUCOSE 106 (H) 10/14/2019 1221   GLUCOSE 103 (H) 02/08/2019 0501   BUN 10 10/14/2019 1221   CREATININE 0.81 10/14/2019 1221   CREATININE 0.75 03/23/2015 0951   CALCIUM 8.8 10/14/2019 1221   PROT 7.0 02/25/2019 1646   ALBUMIN 3.4 (L) 02/25/2019 1646   AST 30 02/25/2019 1646   ALT 25 02/25/2019 1646   ALKPHOS 141 (H) 02/25/2019 1646   BILITOT 0.9 02/25/2019 1646   GFRNONAA 90 10/14/2019 1221   GFRAA 104 10/14/2019 1221     Baseline Immunosuppressant Therapy Labs TB GOLD Pending 11/10/19  Hepatitis Panel Hepatitis Latest Ref Rng & Units 01/26/2019  Hep B Surface Ag NON REACTIVE NON REACTIVE  Hep B IgM NON REACTIVE NON REACTIVE  Hep C Ab NON REACTIVE NON REACTIVE  Hep A IgM NON REACTIVE NON REACTIVE   HIV Update pending 11/10/19 Lab Results  Component Value Date   HIV Reactive (A) 01/26/2019   HIV Non Reactive 03/31/2018   HIV Non Reactive  12/03/2016   01/26/2019- HIV 1/2 Ab Differentiation negative and RNA negative Immunoglobulins Pending 11/10/19  SPEP Pending 11/10/19  G6PD No results found for: G6PDH TPMT No results found for: TPMT   Chest x-ray: There is minimal residual scarring at the left lung base. There is  no evidence of CHF nor other acute cardiopulmonary abnormality.  12/01/2013  Does patient have diagnosis of heart failure?  No  Assessment/Plan:  Counseled patient that Enbrel is a TNF blocking agent. Counseled patient on purpose, proper use, and adverse effects of Enbrel.  Reviewed the most common adverse effects including infections, headache, and injection site reactions. Discussed that there is the possibility of an increased risk of malignancy but it is not well understood if this increased risk is due to the medication or the disease state.  Advised patient to get yearly dermatology exams due to risk of skin cancer.  Counseled patient that Enbrel should be held prior to scheduled surgery.  Counseled patient to avoid live vaccines while on Enbrel. Recommend annual influenza, Pneumovax 23, Prevnar 13, and Shingrix as indicated.   Reviewed the importance of regular labs while on Enbrel therapy.  Advised patient to get standing labs one month after starting Enbrel then every  3 months.  Provided patient with standing lab orders.  Provided patient with medication education material and answered all questions.  Patient voiced understanding.  Patient consented to Enbrel.  Will upload consent into the media tab.  Reviewed storage instructions for Enbrel.  Advised initial injection must be administered in office.  Patient voiced understanding.    Patient dose will be Enbrel 50 mg every 7 days.  Prescription pending labs and/or insurance. Patient has medicare and will likely need patient assistance. He was given application to fill out and return to office.  All questions encouraged and answered.  Instructed patient to  call with any questions or concerns.   Mariella Saa, PharmD, [place], CPP Clinical Specialty Pharmacist (Rheumatology and Pulmonology)  11/10/2019 10:05 AM

## 2019-11-11 ENCOUNTER — Ambulatory Visit (INDEPENDENT_AMBULATORY_CARE_PROVIDER_SITE_OTHER): Payer: Medicare Other | Admitting: Pharmacist

## 2019-11-11 ENCOUNTER — Encounter: Payer: Self-pay | Admitting: Pharmacist

## 2019-11-11 ENCOUNTER — Other Ambulatory Visit: Payer: Self-pay

## 2019-11-11 DIAGNOSIS — I10 Essential (primary) hypertension: Secondary | ICD-10-CM | POA: Diagnosis not present

## 2019-11-11 MED ORDER — AMLODIPINE BESYLATE 2.5 MG PO TABS: 2.5000 mg | ORAL_TABLET | Freq: Every day | ORAL | 3 refills | Status: DC

## 2019-11-11 NOTE — Progress Notes (Signed)
I will discuss results at the follow-up visit.

## 2019-11-11 NOTE — Progress Notes (Signed)
Reviewed: I agree with Dr. Koval's documentation and management. 

## 2019-11-11 NOTE — Patient Instructions (Addendum)
Nice to see you today!  Start amlodipine 2.5 mg daily in the evening. Continue carvedilol 12.5 twice daily and losartan 100 mg daily.  Check blood pressure one hour after taking medications.  Follow up in 3 weeks.

## 2019-11-11 NOTE — Progress Notes (Signed)
S:    Patient arrives in good spirits. Presents to the clinic for hypertension evaluation, counseling, and management.  Patient was referred and last seen by Primary Care Provider on 10/14/2019. Last seen by Clinical Pharmacist on 10/25/19 in which Toprol XL was switched to Carvedilol 12.5 mg BID and Losartan was increased from 50 mg to 100 mg daily.   Today, patient reports medication adherence with carvedilol 12.5 mg twice daily and losartan 100 mg in the evenings. Reports having infrequent light headaches. Reports history of swelling in right LE due to previous harvested vein used for CABG procedure. No reports of current bilateral LEE swelling. BP home readings range from 120-170s/80/110s with highs of 174/106 and 176/110 (see below). Reports he will be starting Enbrel soon for Ankylosing Spondylitis which should help control his pain in his knees and back, and reduce the frequency of ibuprofen.   Current BP Medications include:   Carvedilol 12.5 mg BID  Losartan 100 mg daily  Antihypertensives tried in the past include: amlodipine 10 mg daily (hypotension); Imdur 30 mg daily (on Viagra PRN); ramiprill 10 mg daily  ASCVD risk factors include: HTN, CAD s/p CABG, HLD,   O:  Physical Exam Vitals reviewed.  Constitutional:      Appearance: Normal appearance.  Pulmonary:     Effort: Pulmonary effort is normal.  Musculoskeletal:        General: No swelling.     Right lower leg: Edema present.     Comments: Right LE edema - trace and localized to the foot.  Edema consistent with post-leg vein harvest for CABG.   Neurological:     Mental Status: He is alert.  Psychiatric:        Mood and Affect: Mood normal.        Behavior: Behavior normal.        Thought Content: Thought content normal.     Review of Systems  Musculoskeletal: Positive for back pain.  Skin: Positive for itching.  All other systems reviewed and are negative.   Home BP readings:  151/80, 126/68, 114/56, 157/83,  124/66, 116/65, 164/96, 143/81, 136/76, 173/97, 152/87, 167/82, 166/104, 172/101, 174/106, 176/110, 143/85  HR 70-80s  Last 3 Office BP readings: BP Readings from Last 3 Encounters:  11/11/19 (!) 155/72  11/10/19 (!) 157/95  10/25/19 (!) 174/85    BMET    Component Value Date/Time   NA 136 11/10/2019 1023   NA 136 10/14/2019 1221   K 4.1 11/10/2019 1023   CL 101 11/10/2019 1023   CO2 25 11/10/2019 1023   GLUCOSE 96 11/10/2019 1023   BUN 15 11/10/2019 1023   BUN 10 10/14/2019 1221   CREATININE 1.08 11/10/2019 1023   CALCIUM 9.1 11/10/2019 1023   GFRNONAA 69 11/10/2019 1023   GFRAA 80 11/10/2019 1023    Renal function: Estimated Creatinine Clearance: 84.6 mL/min (by C-G formula based on SCr of 1.08 mg/dL).  Clinical ASCVD: Yes  The 10-year ASCVD risk score Mikey Bussing DC Jr., et al., 2013) is: 49.5%   Values used to calculate the score:     Age: 16 years     Sex: Male     Is Non-Hispanic African American: No     Diabetic: Yes     Tobacco smoker: No     Systolic Blood Pressure: 237 mmHg     Is BP treated: Yes     HDL Cholesterol: 31 mg/dL     Total Cholesterol: 149 mg/dL   A/P: Hypertension diagnosed  currently uncontrolled on current medications. BP Goal = < 130/80 mmHg. Clinic BP and home BP readings are elevated.  Patient appears adherent with his medications. Patient previously on amlodipine 10 mg in 2020, however it was discontinued due to hypotension (SBP: low 100s). -Started amlodipine 2.5 mg daily (lowest dose for minimization of hypotension, previously experienced with 10mg  dose).  Counseled on peripheral edema side-effect and to monitor for any additional swelling. -Continued carvedilol 12.5 mg BID -Continued Losartan 100 mg daily    Discussed the risk of signficant hypotension with nitroglycerin and Viagra (sildenafil) 20mg  and instructed patient to not use concurrently. Patient verbalized understanding. Encouraged patient to continue checking BP at home and to  check at least 1 hour after taking BP medications.  -Counseled on lifestyle modifications for blood pressure control including reduced dietary sodium, increased exercise, adequate sleep.  Results reviewed and written information provided.   Total time in face-to-face counseling 20 minutes.   F/U Clinic Visit in 3 weeks.  Patient seen with Fara Olden, PharmD - PGY-1 Resident. and Lorel Monaco, PharmD, PGY2 Pharmacy Resident.

## 2019-11-11 NOTE — Assessment & Plan Note (Signed)
Hypertension diagnosed currently uncontrolled on current medications. BP Goal = < 130/80 mmHg. Clinic BP and home BP readings are elevated.  Patient appears adherent with his medications. Patient previously on amlodipine 10 mg in 2020, however it was discontinued due to hypotension (SBP: low 100s). -Started amlodipine 2.5 mg daily (lowest dose for minimization of hypotension, previously experienced with 10mg  dose).  Counseled on peripheral edema side-effect and to monitor for any additional swelling. -Continued carvedilol 12.5 mg BID -Continued Losartan 100 mg daily

## 2019-11-12 LAB — CBC WITH DIFFERENTIAL/PLATELET
Absolute Monocytes: 725 cells/uL (ref 200–950)
Basophils Absolute: 70 cells/uL (ref 0–200)
Basophils Relative: 1.2 %
Eosinophils Absolute: 278 cells/uL (ref 15–500)
Eosinophils Relative: 4.8 %
HCT: 39.2 % (ref 38.5–50.0)
Hemoglobin: 11.9 g/dL — ABNORMAL LOW (ref 13.2–17.1)
Lymphs Abs: 1427 cells/uL (ref 850–3900)
MCH: 25.2 pg — ABNORMAL LOW (ref 27.0–33.0)
MCHC: 30.4 g/dL — ABNORMAL LOW (ref 32.0–36.0)
MCV: 82.9 fL (ref 80.0–100.0)
MPV: 10.7 fL (ref 7.5–12.5)
Monocytes Relative: 12.5 %
Neutro Abs: 3300 cells/uL (ref 1500–7800)
Neutrophils Relative %: 56.9 %
Platelets: 149 10*3/uL (ref 140–400)
RBC: 4.73 10*6/uL (ref 4.20–5.80)
RDW: 17.1 % — ABNORMAL HIGH (ref 11.0–15.0)
Total Lymphocyte: 24.6 %
WBC: 5.8 10*3/uL (ref 3.8–10.8)

## 2019-11-12 LAB — PROTEIN ELECTROPHORESIS, SERUM, WITH REFLEX
Albumin ELP: 3.7 g/dL — ABNORMAL LOW (ref 3.8–4.8)
Alpha 1: 0.3 g/dL (ref 0.2–0.3)
Alpha 2: 0.8 g/dL (ref 0.5–0.9)
Beta 2: 0.3 g/dL (ref 0.2–0.5)
Beta Globulin: 0.5 g/dL (ref 0.4–0.6)
Gamma Globulin: 2.3 g/dL — ABNORMAL HIGH (ref 0.8–1.7)
Total Protein: 7.8 g/dL (ref 6.1–8.1)

## 2019-11-12 LAB — HEPATITIS C ANTIBODY
Hepatitis C Ab: NONREACTIVE
SIGNAL TO CUT-OFF: 0.06 (ref <1.00)

## 2019-11-12 LAB — IGG, IGA, IGM
IgG (Immunoglobin G), Serum: 2536 mg/dL — ABNORMAL HIGH (ref 600–1540)
IgM, Serum: 142 mg/dL (ref 50–300)
Immunoglobulin A: <5 mg/dL — ABNORMAL LOW (ref 70–320)

## 2019-11-12 LAB — QUANTIFERON-TB GOLD PLUS
Mitogen-NIL: >10 IU/mL
NIL: 0.07 IU/mL
QuantiFERON-TB Gold Plus: NEGATIVE
TB1-NIL: 0.01 IU/mL
TB2-NIL: 0 IU/mL

## 2019-11-12 LAB — COMPLETE METABOLIC PANEL WITH GFR
AG Ratio: 1 (calc) (ref 1.0–2.5)
ALT: 29 U/L (ref 9–46)
AST: 41 U/L — ABNORMAL HIGH (ref 10–35)
Albumin: 3.9 g/dL (ref 3.6–5.1)
Alkaline phosphatase (APISO): 90 U/L (ref 35–144)
BUN: 15 mg/dL (ref 7–25)
CO2: 25 mmol/L (ref 20–32)
Calcium: 9.1 mg/dL (ref 8.6–10.3)
Chloride: 101 mmol/L (ref 98–110)
Creat: 1.08 mg/dL (ref 0.70–1.18)
GFR, Est African American: 80 mL/min/{1.73_m2} (ref >=60)
GFR, Est Non African American: 69 mL/min/{1.73_m2} (ref >=60)
Globulin: 3.8 g/dL (calc) — ABNORMAL HIGH (ref 1.9–3.7)
Glucose, Bld: 96 mg/dL (ref 65–99)
Potassium: 4.1 mmol/L (ref 3.5–5.3)
Sodium: 136 mmol/L (ref 135–146)
Total Bilirubin: 0.9 mg/dL (ref 0.2–1.2)
Total Protein: 7.7 g/dL (ref 6.1–8.1)

## 2019-11-12 LAB — HEPATITIS B CORE ANTIBODY, IGM: Hep B C IgM: NONREACTIVE

## 2019-11-12 LAB — HIV ANTIBODY (ROUTINE TESTING W REFLEX): HIV 1&2 Ab, 4th Generation: NONREACTIVE

## 2019-11-12 LAB — SEDIMENTATION RATE: Sed Rate: 22 mm/h — ABNORMAL HIGH (ref 0–20)

## 2019-11-12 LAB — HLA-B27 ANTIGEN: HLA-B27 Antigen: POSITIVE — AB

## 2019-11-12 LAB — HEPATITIS B SURFACE ANTIGEN: Hepatitis B Surface Ag: NONREACTIVE

## 2019-11-23 NOTE — Telephone Encounter (Signed)
Patient dropped of application.  Pending provider signature.   Mariella Saa, PharmD, [place], CPP Clinical Specialty Pharmacist (Rheumatology and Pulmonology)  11/23/2019 11:31 AM

## 2019-11-24 NOTE — Telephone Encounter (Signed)
Received signed provider portion.  Faxed application for approval.  Will update when we receive a response.  Fax # [phone]   Mariella Saa, PharmD, BCACP, CPP Clinical Specialty Pharmacist (Rheumatology and Pulmonology)  11/24/2019 8:23 AM

## 2019-11-26 NOTE — Telephone Encounter (Signed)
Received fax from [employer], application has been APPROVED. Coverage is from 11/26/19 to 03/10/20.  Will send document to [facility].  Phone# [phone] Fax# [phone]

## 2019-11-29 NOTE — Progress Notes (Deleted)
Office Visit Note  Patient: Gregory Hansen             Date of Birth: 01-21-1951           MRN: [id number]             PCP: Rise Patience, DO Referring: Myles Gip, DO Visit Date: 12/07/2019 Occupation: '@GUAROCC' @  Subjective:  No chief complaint on file.   History of Present Illness: Gregory Hansen is a 69 y.o. male ***   Activities of Daily Living:  Patient reports morning stiffness for *** {minute/hour:19697}.   Patient {ACTIONS;DENIES/REPORTS:21021675::"Denies"} nocturnal pain.  Difficulty dressing/grooming: {ACTIONS;DENIES/REPORTS:21021675::"Denies"} Difficulty climbing stairs: {ACTIONS;DENIES/REPORTS:21021675::"Denies"} Difficulty getting out of chair: {ACTIONS;DENIES/REPORTS:21021675::"Denies"} Difficulty using hands for taps, buttons, cutlery, and/or writing: {ACTIONS;DENIES/REPORTS:21021675::"Denies"}  No Rheumatology ROS completed.   PMFS History:  Patient Active Problem List   Diagnosis Date Noted  . Alcohol use disorder, mild, abuse 10/14/2019  . Chronic bilateral low back pain without sciatica 07/13/2019  . Numbness and tingling in right hand 07/13/2019  . Fall at home, initial encounter 02/02/2019  . Leg cramping 12/22/2018  . Rash 05/23/2018  . Peripheral neuropathy 11/04/2017  . Gynecomastia, male 02/18/2017  . Healthcare maintenance 01/20/2017  . Aortic atherosclerosis ([place]) 12/03/2016  . History of cryptosporidiosis   . S/P CABG x 4 10/29/2013  . Hyperlipidemia 06/15/2008  . Depression, major, single episode, mild ([place]) 06/15/2008  . HTN, goal below 140/90 06/15/2008  . CAD (coronary artery disease) 06/15/2008  . SPONDYLITIS, ANKYLOSING 06/15/2008  . Atherosclerotic heart disease of native coronary artery without angina pectoris 06/15/2008  . Gastro-esophageal reflux disease without esophagitis 06/15/2008  . Major depressive disorder, single episode 06/15/2008    Past Medical History:  Diagnosis Date  . Acute hepatitis 01/26/2019  .  Acute kidney failure ([place]) 03/30/2018  . AKI (acute kidney injury) ([place])   . Anginal pain ([place])   . Anxiety   . Arthritis    SPINE   . Arthritis   . Cellulitis 03/30/2018  . Coronary artery disease    s/p multiple percutaneous interventions, PCI 202 for DMI and DES distal RCA and CFX-OM 2006  . Coronary artery disease   . Depression   . GERD (gastroesophageal reflux disease)   . Hyperlipidemia    mixed  . Hyperlipidemia   . Hypertension   . MI (myocardial infarction) ([place])   . Myocardial infarction ([place])   . Peripheral neuropathy    calves and both feet  . Peripheral neuropathy    calves and both feet  . Rhabdomyolysis 02/02/2019  . Severe sepsis with acute organ dysfunction ([place]) 12/03/2016  . Spondylitis, ankylosing ([facility])     Family History  Problem Relation Age of Onset  . Breast cancer Mother 38  . Heart disease Father   . Heart disease Brother   . Leukemia Maternal Grandmother   . Heart disease Maternal Grandfather   . Heart disease Other        positive for cardiac disease and both brothers have had cardiac events  . Heart attack Brother    Past Surgical History:  Procedure Laterality Date  . CARDIAC CATHETERIZATION     stent RCA June3, 2002, Stent OM/PTCA circumflex August 13, 2000  . CORONARY ANGIOPLASTY WITH STENT PLACEMENT     first one in 2006; had another place 3 months ago (August 2013)  . CORONARY ANGIOPLASTY WITH STENT PLACEMENT  03/26/2013   RCA           DR COOPER  .  CORONARY ANGIOPLASTY WITH STENT PLACEMENT    . CORONARY ARTERY BYPASS GRAFT N/A 10/29/2013   Procedure: CORONARY ARTERY BYPASS GRAFTING x 4 (LIMA-LAD, SVG-OM, SVG-PD-PL) ENDOSCOPIC VEIN HARVEST RIGHT THIGH;  Surgeon: Gaye Pollack, MD;  Location: [facility] OR;  Service: Open Heart Surgery;  Laterality: N/A;  . CORONARY ARTERY BYPASS GRAFT    . FINGER SURGERY     5  TH     DIGIT RIGHT HAND  . FINGER SURGERY    . INTRAOPERATIVE TRANSESOPHAGEAL ECHOCARDIOGRAM N/A 10/29/2013   Procedure:  INTRAOPERATIVE TRANSESOPHAGEAL ECHOCARDIOGRAM;  Surgeon: Gaye Pollack, MD;  Location: [facility] OR;  Service: Open Heart Surgery;  Laterality: N/A;  . KNEE ARTHROSCOPY W/ LASER  2003  . LEFT HEART CATHETERIZATION WITH CORONARY ANGIOGRAM N/A 10/01/2011   Procedure: LEFT HEART CATHETERIZATION WITH CORONARY ANGIOGRAM;  Surgeon: [name], MD;  Location: [facility] CATH LAB;  Service: Cardiovascular;  Laterality: N/A;  . LEFT HEART CATHETERIZATION WITH CORONARY ANGIOGRAM N/A 03/26/2013   Procedure: LEFT HEART CATHETERIZATION WITH CORONARY ANGIOGRAM;  Surgeon: Blane Ohara, MD;  Location: [facility] CATH LAB;  Service: Cardiovascular;  Laterality: N/A;  . LEFT HEART CATHETERIZATION WITH CORONARY ANGIOGRAM N/A 10/25/2013   Procedure: LEFT HEART CATHETERIZATION WITH CORONARY ANGIOGRAM;  Surgeon: Blane Ohara, MD;  Location: [facility] CATH LAB;  Service: Cardiovascular;  Laterality: N/A;   Social History   Social History Narrative   ** Merged History Encounter **       Lives in a one level home with son, and dog, Scientist, physiological. Works out of the house. 2 steps into house. Smoke alarms in house. No grab bars in bathroom. Has some area rugs and throw rugs with backing.      No pets of his own. Restores antique pens, does like to walk and cycle but having problems with peripheral neuropathy.       Firearm is safely stored.      Wears seat belts in vehicle. Does use sunblock and tries to avoid sun due to vitiligo and previous sunburns.      Eats varied diet. Likes protein, fresh fruits and veggies. Watches fats, saturated fats. Drink a lot of water.    Immunization History  Administered Date(s) Administered  . Influenza, High Dose Seasonal PF 11/23/2016  . Influenza,inj,Quad PF,6+ Mos 12/18/2017, 11/20/2018  . PFIZER SARS-COV-2 Vaccination 05/03/2019, 05/24/2019  . Pneumococcal Conjugate-13 01/20/2017  . Pneumococcal Polysaccharide-23 07/01/2015, 04/01/2018  . Tdap 06/30/2015  . Zoster Recombinat (Shingrix) 06/28/2019,  09/13/2019     Objective: Vital Signs: There were no vitals taken for this visit.   Physical Exam   Musculoskeletal Exam: ***  CDAI Exam: CDAI Score: -- Patient Global: --; Provider Global: -- Swollen: --; Tender: -- Joint Exam 12/07/2019   No joint exam has been documented for this visit   There is currently no information documented on the homunculus. Go to the Rheumatology activity and complete the homunculus joint exam.  Investigation: No additional findings.  Imaging: XR Thoracic Spine 2 View  Result Date: 11/10/2019 AP view of thoracic spine was difficult to assess due to previous CABG. Lateral spine shows mild spondylosis no syndesmophytes were noted. Impression: Findings are consistent with multilevel spondylosis.  XR KNEE 3 VIEW LEFT  Result Date: 11/10/2019 Severe medial compartment narrowing was noted. Severe patellofemoral narrowing was noted. No chondrocalcinosis was noted. Impression: These findings are consistent with severe osteoarthritis and severe chondromalacia patella.  XR KNEE 3 VIEW RIGHT  Result Date: 11/10/2019 Moderate to severe medial compartment narrowing was  noted. Moderate patellofemoral narrowing was noted. No chondrocalcinosis was noted. Impression: These findings are consistent with moderate to severe osteoarthritis and moderate chondromalacia patella.  XR Lumbar Spine 2-3 Views  Result Date: 11/10/2019 Old L1 vertebral fracture was noted. Multilevel spondylosis and facet joint arthropathy was noted. No syndesmophytes were noted. Levoscoliosis was noted. Impression: These findings are consistent with levoscoliosis, spondylosis, facet joint arthropathy and old L1 vertebral fracture.  XR Pelvis 1-2 Views  Result Date: 11/10/2019 Bilateral SI joints are completely fused. No hip joint narrowing was noted. Impression: Bilateral SI joints are completely fused   Recent Labs: Lab Results  Component Value Date   WBC 5.8 11/10/2019   HGB 11.9 (L)  11/10/2019   PLT 149 11/10/2019   NA 136 11/10/2019   K 4.1 11/10/2019   CL 101 11/10/2019   CO2 25 11/10/2019   GLUCOSE 96 11/10/2019   BUN 15 11/10/2019   CREATININE 1.08 11/10/2019   BILITOT 0.9 11/10/2019   ALKPHOS 141 (H) 02/25/2019   AST 41 (H) 11/10/2019   ALT 29 11/10/2019   PROT 7.7 11/10/2019   PROT 7.8 11/10/2019   ALBUMIN 3.4 (L) 02/25/2019   CALCIUM 9.1 11/10/2019   GFRAA 80 11/10/2019   QFTBGOLDPLUS NEGATIVE 11/10/2019    November 10, 2019 SPEP consistent with chronic inflammatory pattern, TB Gold negative, IgA less than 5, IgG elevated, hepatitis B-, hepatitis C negative, HIV negative, ESR 22, HLA-B27 positive  Speciality Comments: No specialty comments available.  Procedures:  No procedures performed Allergies: Patient has no known allergies.   Assessment / Plan:     Visit Diagnoses: No diagnosis found.  Orders: No orders of the defined types were placed in this encounter.  No orders of the defined types were placed in this encounter.   Face-to-face time spent with patient was *** minutes. Greater than 50% of time was spent in counseling and coordination of care.  Follow-Up Instructions: No follow-ups on file.   Bo Merino, MD  Note - This record has been created using Editor, commissioning.  Chart creation errors have been sought, but may not always  have been located. Such creation errors do not reflect on  the standard of medical care.

## 2019-11-29 NOTE — Telephone Encounter (Signed)
Patient approved for Enbrel patient assistance. Please schedule Enbrel new start visit.

## 2019-11-30 NOTE — Telephone Encounter (Signed)
Patient scheduled for a new start visit on 12/02/2019 at 10:00 am.

## 2019-12-02 ENCOUNTER — Other Ambulatory Visit: Payer: Self-pay

## 2019-12-02 ENCOUNTER — Ambulatory Visit (INDEPENDENT_AMBULATORY_CARE_PROVIDER_SITE_OTHER): Payer: Medicare Other | Admitting: Pharmacist

## 2019-12-02 ENCOUNTER — Encounter: Payer: Self-pay | Admitting: Pharmacist

## 2019-12-02 VITALS — BP 166/89 | HR 70

## 2019-12-02 DIAGNOSIS — I10 Essential (primary) hypertension: Secondary | ICD-10-CM | POA: Diagnosis not present

## 2019-12-02 DIAGNOSIS — M459 Ankylosing spondylitis of unspecified sites in spine: Secondary | ICD-10-CM

## 2019-12-02 MED ORDER — ENBREL MINI 50 MG/ML ~~LOC~~ SOCT: 50.0000 mg | SUBCUTANEOUS | 0 refills | Status: DC

## 2019-12-02 MED ORDER — HYDROCHLOROTHIAZIDE 12.5 MG PO CAPS: 12.5000 mg | ORAL_CAPSULE | Freq: Every day | ORAL | 3 refills | Status: DC

## 2019-12-02 NOTE — Assessment & Plan Note (Signed)
Hypertension diagnosed currently uncontrolled on current medications. BP Goal = < 130/80 mmHg. Clinic Bp and home BP readings are above goal. Medication adherence reported by patient. Patient experiencing mild increase in edema since starting amlodipine, will avoid increasing dose further at this time. No prior trials of thiazide diuretics, renal fuction and potassium are stable in normal limits. Reasonable to trial to reach BP goal. Will consider combination ARB/thiaizide tablet to reduce pill burden once at BP goal or max tolerated dose. -Started hydrochlorothiazide 12.5 mg daily in the morning.  -Continue amlodipine 2.5 mg daily - Continue carvedilol 12.5 mg BID - Continue losartan 100 mg daily

## 2019-12-02 NOTE — Progress Notes (Signed)
S:    Patient arrives in good spirits, ambulating without assistance. Presents to the clinic for hypertension evaluation, counseling, and management.  Patient was referred and last seen by Primary Care Provider on 10/14/2019. Last seen by Clinical Pharmacist on 11/15/19 in which amlodipine 2.5 mg was started.  Today, patient reports he has stopped taking ibuprofen because he does not want it to increase his blood pressure. Patient with Ankylosing Spondylitis and reports lower back pain 7-8/10 when standing for a period of time and struggles to bring his groceries into his house. Current pain while sitting in office is 1/10. Patient reports he is starting Enbrel today after this visit.  Patient reports occasional lightheadedness, with no dizziness. With feeling of onset of headache. Installed seat in shower due to neuropathy. Endorses urinary frequency overnight, wakes 3-4 times per night.  Medication adherence reported, denies missed doses .  Current BP Medications include:   Amlodipine 2.5 mg daily  Carvedilol 12.5 mg BID  Losartan 100 mg daily  Antihypertensives tried in the past include: amlodipine 10 mg daily (hypotension); Imdur 30 mg daily (on Viagra PRN); ramiprill 10 mg daily  ASCVD risk factors include: HTN, CAD s/p CABG, HLD   O:  Physical Exam Vitals reviewed.  Constitutional:      Appearance: Normal appearance.  Pulmonary:     Effort: Pulmonary effort is normal.  Musculoskeletal:        General: No swelling.     Right lower leg: Edema present.     Left lower leg: No edema.     Comments: Right LE edema increased 2 days ago, localized to the ankle. History of baseline edema with post-leg vein harvest for CABG.   Neurological:     Mental Status: He is alert.  Psychiatric:        Mood and Affect: Mood normal.        Behavior: Behavior normal.     Review of Systems  Musculoskeletal: Positive for back pain. Negative for falls.  Skin: Positive for itching.  All  other systems reviewed and are negative.   Home BP readings: Lowest 77/46, highest 187/109. Average range 100-160/70-90.    Last 3 Office BP readings: BP Readings from Last 3 Encounters:  11/11/19 (!) 155/72  11/10/19 (!) 157/95  10/25/19 (!) 174/85    BMET    Component Value Date/Time   NA 136 11/10/2019 1023   NA 136 10/14/2019 1221   K 4.1 11/10/2019 1023   CL 101 11/10/2019 1023   CO2 25 11/10/2019 1023   GLUCOSE 96 11/10/2019 1023   BUN 15 11/10/2019 1023   BUN 10 10/14/2019 1221   CREATININE 1.08 11/10/2019 1023   CALCIUM 9.1 11/10/2019 1023   GFRNONAA 69 11/10/2019 1023   GFRAA 80 11/10/2019 1023    Renal function: CrCl cannot be calculated (Patient's most recent lab result is older than the maximum 21 days allowed.).  Clinical ASCVD: Yes  The ASCVD Risk score Mikey Bussing DC Jr., et al., 2013) failed to calculate for the following reasons:   The patient has a prior MI or stroke diagnosis  A/P: Hypertension diagnosed currently uncontrolled on current medications. BP Goal = < 130/80 mmHg. Clinic Bp and home BP readings are above goal. Medication adherence reported by patient. Patient experiencing mild increase in edema since starting amlodipine, will avoid increasing dose further at this time. No prior trials of thiazide diuretics, renal fuction and potassium are stable in normal limits. Reasonable to trial to reach BP goal.  Will consider combination ARB/thiaizide tablet to reduce pill burden once at BP goal or max tolerated dose. -Started hydrochlorothiazide 12.5 mg daily in the morning.  -Continue amlodipine 2.5 mg daily - Continue carvedilol 12.5 mg BID - Continue losartan 100 mg daily -F/u labs at next visit: BMET -Counseled on lifestyle modifications for blood pressure control including reduced dietary sodium, increased exercise, adequate sleep.  Results reviewed and written information provided.   Total time in face-to-face counseling 12 minutes.   F/U Clinic  Visit in 4 weeks .  Patient seen with Fara Olden, PharmD - PGY-1 Resident. and Lorel Monaco, PharmD, PGY2 Pharmacy Resident.

## 2019-12-02 NOTE — Progress Notes (Signed)
Reviewed: I agree with Dr. Koval's documentation and management. 

## 2019-12-02 NOTE — Patient Instructions (Addendum)
Great to see you again today!  Your blood pressure today was still a little bit high. Your goal is < 130/80.  Start hydrochlorothiazide (HCTZ) 12.5 mg 1 capsule daily in the morning.   Continue amlodipine, losartan, and carvedilol.  Continue to check your blood pressure at least 1 hour after you take your blood pressure medications. Bring your readings to the next visit.  See you in 1 month!

## 2019-12-02 NOTE — Patient Instructions (Addendum)
Remember the 5 C's:  COUNTER- leave on the counter at least 30 mins but up to overnight to bring medication to room temperature and prevent stinging  COLD- Placing something cold (like and ice gel pack or cold water bottle) on the injection site just before cleansing with alcohol may help reduce pain  CLARITIN- for the first two weeks of treatment or  the day of, the day before, and the day after injecting to minimize injection site reactions  CORTISONE CREAM- apply if injection site is irritated and itching  CALL ME- if injection site reaction is bigger than the size of your fist, looks infected, blisters, or develop hives   Standing Labs We placed an order today for your standing lab work.   Please have your standing labs drawn in 1 month and then every 3 months.  If possible, please have your labs drawn 2 weeks prior to your appointment so that the provider can discuss your results at your appointment.  We have open lab daily Monday through Thursday from 8:30-12:30 PM and 1:30-4:30 PM and Friday from 8:30-12:30 PM and 1:30-4:00 PM at the office of Dr. Bo Merino, [facility].   Please be advised, patients with office appointments requiring lab work will take precedents over walk-in lab work.  If possible, please come for your lab work on Monday and Friday afternoons, as you may experience shorter wait times. The office is located at [address], [place], [place], [place] [address] No appointment is necessary.   Labs are drawn by [facility]. Please bring your co-pay at the time of your lab draw.  You may receive a bill from [facility] for your lab work.  If you wish to have your labs drawn at another location, please call the office 24 hours in advance to send orders.  If you have any questions regarding directions or hours of operation,  please call [phone].   As a reminder, please drink plenty of water prior to coming for your lab work. Thanks!

## 2019-12-02 NOTE — Progress Notes (Signed)
Flu Vaccine given LD without complication.

## 2019-12-02 NOTE — Progress Notes (Signed)
Pharmacy Note  Subjective:   Patient presents to clinic today to receive first dose of Enbrel.  Patient running a fever or have signs/symptoms of infection? No  Patient currently on antibiotics for the treatment of infection? No  Patient have any upcoming invasive procedures/surgeries? No  Objective: CMP     Component Value Date/Time   NA 136 11/10/2019 1023   NA 136 10/14/2019 1221   K 4.1 11/10/2019 1023   CL 101 11/10/2019 1023   CO2 25 11/10/2019 1023   GLUCOSE 96 11/10/2019 1023   BUN 15 11/10/2019 1023   BUN 10 10/14/2019 1221   CREATININE 1.08 11/10/2019 1023   CALCIUM 9.1 11/10/2019 1023   PROT 7.7 11/10/2019 1023   PROT 7.8 11/10/2019 1023   PROT 7.0 02/25/2019 1646   ALBUMIN 3.4 (L) 02/25/2019 1646   AST 41 (H) 11/10/2019 1023   ALT 29 11/10/2019 1023   ALKPHOS 141 (H) 02/25/2019 1646   BILITOT 0.9 11/10/2019 1023   BILITOT 0.9 02/25/2019 1646   GFRNONAA 69 11/10/2019 1023   GFRAA 80 11/10/2019 1023    CBC    Component Value Date/Time   WBC 5.8 11/10/2019 1023   RBC 4.73 11/10/2019 1023   HGB 11.9 (L) 11/10/2019 1023   HGB 11.8 (L) 10/14/2019 1221   HCT 39.2 11/10/2019 1023   HCT 37.9 10/14/2019 1221   PLT 149 11/10/2019 1023   PLT 137 (L) 10/14/2019 1221   MCV 82.9 11/10/2019 1023   MCV 83 10/14/2019 1221   MCH 25.2 (L) 11/10/2019 1023   MCHC 30.4 (L) 11/10/2019 1023   RDW 17.1 (H) 11/10/2019 1023   RDW 17.3 (H) 10/14/2019 1221   LYMPHSABS 1,427 11/10/2019 1023   LYMPHSABS 1.2 10/14/2019 1221   MONOABS 0.8 02/08/2019 0502   EOSABS 278 11/10/2019 1023   EOSABS 0.3 10/14/2019 1221   BASOSABS 70 11/10/2019 1023   BASOSABS 0.1 10/14/2019 1221    Baseline Immunosuppressant Therapy Labs TB GOLD Quantiferon TB Gold Latest Ref Rng & Units 11/10/2019  Quantiferon TB Gold Plus NEGATIVE NEGATIVE   Hepatitis Panel Hepatitis Latest Ref Rng & Units 11/10/2019  Hep B Surface Ag NON-REACTI NON-REACTIVE  Hep B IgM NON-REACTI NON-REACTIVE  Hep C Ab NON  REACTIVE -  Hep C Ab NON-REACTI NON-REACTIVE  Hep C Ab NON-REACTI NON-REACTIVE  Hep A IgM NON REACTIVE -   HIV Lab Results  Component Value Date   HIV NON-REACTIVE 11/10/2019   HIV Reactive (A) 01/26/2019   HIV Non Reactive 03/31/2018   HIV Non Reactive 12/03/2016   Immunoglobulins Immunoglobulin Electrophoresis Latest Ref Rng & Units 11/10/2019  IgA  70 - 320 mg/dL <5(L)  IgG 600 - 1,540 mg/dL 2,536(H)  IgM 50 - 300 mg/dL 142   SPEP Serum Protein Electrophoresis Latest Ref Rng & Units 11/10/2019  Total Protein 6.1 - 8.1 g/dL 7.8  Albumin 3.8 - 4.8 g/dL 3.7(L)  Alpha-1 0.2 - 0.3 g/dL 0.3  Alpha-2 0.5 - 0.9 g/dL 0.8  Beta Globulin 0.4 - 0.6 g/dL 0.5  Beta 2 0.2 - 0.5 g/dL 0.3  Gamma Globulin 0.8 - 1.7 g/dL 2.3(H)   G6PD No results found for: G6PDH TPMT No results found for: TPMT   Chest x-ray: There is minimal residual scarring at the left lung base. There is  no evidence of CHF nor other acute cardiopulmonary abnormality. 12/01/2013  Assessment/Plan:  Demonstrated proper injection technique with Enbrel demo pen.  Patient able to demonstrate proper injection technique using the teach back method.  Patient self injected in the right anterior thigh with:  Sample Medication: Enbrel Mini Cartridge NDC: 92780-044-71 Lot: [id number] Expiration: 04/23  Patient tolerated well.  Observed for 30 mins in office for adverse reaction and none noted.   Patient is to return in 6-8 weeks for a follow up appointment and 1 month for labs. Standing orders placed.   Patient approved for [employer] and will receive prescriptions from [facility].  Patient will call to set up first shipment.  Patient given sample with same lot and expiration as above in case of shipping delays.  All questions encouraged and answered.  Instructed patient to call with any further questions or concerns.  Mariella Saa, PharmD, [facility] Rheumatology Clinical Pharmacist  12/02/2019 9:47 AM

## 2019-12-03 ENCOUNTER — Telehealth: Payer: Self-pay

## 2019-12-03 DIAGNOSIS — D802 Selective deficiency of immunoglobulin A [IgA]: Secondary | ICD-10-CM

## 2019-12-03 NOTE — Telephone Encounter (Signed)
-----   Message from Bo Merino, MD sent at 12/03/2019  2:45 PM EDT ----- Regarding: FW: I spoke with Dr. Nelva Bush.  She said that selective IgA deficiency did not increase the risk of infection.  She would just like to evaluate patient further.  Please advise patient that we have referring him to immunology. Thank you, Bo Merino ----- Message ----- From: Tommy Medal, Lavell Islam, MD Sent: 12/02/2019  10:50 PM EDT To: Bo Merino, MD, [facility] Triage Nurse Pool Subject: RE:                                            I think someone should see him in our clinic. I won't be back in clinic for a month but there are other providers that could surely see him. Immunology referral not a bad idea either ----- Message ----- From: Bo Merino, MD Sent: 11/29/2019   2:03 PM EDT To: Truman Hayward, MD  I need your advise regarding this patient.  He came to me as a new patient and has been diagnosed with ankylosing spondylitis.  My plan is to start him on immunosuppressive agents.  His immunoglobulin panel showed selective IgA deficiency.  Please advise if you will see him for further evaluation of selective IgA deficiency before starting immunosuppressive agents or shall I refer him to an immunologist.  Thank you, Abel Presto

## 2019-12-03 NOTE — Telephone Encounter (Signed)
Referral placed. I attempted to contact patient and left message on machine for patient to call the office (to advise of referral).

## 2019-12-06 ENCOUNTER — Telehealth: Payer: Self-pay | Admitting: *Deleted

## 2019-12-06 NOTE — Telephone Encounter (Signed)
Attempted to call patient, left voicemail asking him to return call.

## 2019-12-06 NOTE — Telephone Encounter (Signed)
Spoke with patient regarding referral to Dr. Jeralyn Ruths office. Patient has scheduled an appointment for 01/19/2020.

## 2019-12-06 NOTE — Telephone Encounter (Signed)
Spoke with patient, advised him referral to Dr. Jeralyn Ruths office has been placed and per Epic their office has tried to contact him to schedule an appointment. Provided patient with Dr. Jeralyn Ruths phone #.

## 2019-12-07 ENCOUNTER — Encounter: Payer: Self-pay | Admitting: Gastroenterology

## 2019-12-07 ENCOUNTER — Ambulatory Visit: Payer: Medicare Other | Admitting: Rheumatology

## 2019-12-27 ENCOUNTER — Other Ambulatory Visit: Payer: Self-pay

## 2019-12-27 ENCOUNTER — Ambulatory Visit: Payer: Medicare Other | Admitting: Infectious Diseases

## 2019-12-27 VITALS — BP 141/84 | HR 75 | Temp 97.5°F | Wt 250.0 lb

## 2019-12-27 DIAGNOSIS — D802 Selective deficiency of immunoglobulin A [IgA]: Secondary | ICD-10-CM

## 2019-12-27 NOTE — Progress Notes (Addendum)
[facility]                                                             [place], [place], [place], [address]                                                                  Phn. [phone]; Fax: [phone]                                                                             Date: 12/27/19  Reason for Referral: Selective IgA Deficiency  Referring Provider: Talmage Coin, MD  Assessment 69 Y O Caucasian Male with a PMH of HLA B27 positive Ankylosing Spondylitis, Vitiligo, h/o cryptosporidiosis, CAD s/p CABG, HTN, HLD, GERD, Alcohol Use who is referred to [facility] in the setting of low IgA levels with normal IgA and IgM for concerns of Selective IgA Deficiency.   Infectious work up has been negative with hep B surface ag negative, hep B core Ab IgM negative, hep V ab negative and HIV NR ( 11/10/19)  Patient denies having diarrhea currently and is constipated  ( on laxatives). Denies h/o recurrent sinopulmonary infections in childhood/young adult. Denies h/o autoimmune diseases or immunodeficiency disorders in the family.  Plan Will order a repeat set of Immunoglobulins for confirmation of selective IgA deficiency Patient has an appointment with Immunologist in November for further evaluation of Selective IgA def F/u after Immunologist evaluation  Orders Placed This Encounter  Procedures  . CBC w/Diff  . COMPLETE METABOLIC PANEL WITH GFR  . Antinuclear Antib (ANA)  . Rheumatoid Factor  . C-reactive protein  . Sedimentation rate  . IgG, IgA, IgM  . IgE    I spent greater than 60 minutes with the patient including greater than 50% of time in face to face counsel of the patient and in coordination of their care.   Rosiland Oz, MD [facility]  Office phone [phone] Fax no.  [phone] ______________________________________________________________________________________________________________________  HPI: 69 Y O Caucasian Male with a PMH of HLA B27 positive Ankylosing Spondylitis, Vitiligo, h/o cryptosporidiosis, CAD s/p CABG, HTN, HLD who is referred to [facility] in the setting of low IgA levels with normal IgA and IgM. Patient says he was diagnosed with Ankylosing spondylitis(AS)when he was in his 70s when he started having recurrent episodes of iritis. He was seen by a Rheumatologist and was also tested positive for HLAB27. His back pain was managed conservatively mostly with Ibuprofen 800-1027m/day. However, he had a fall 2 -3 months ago when he landed up in his back and the lower back pain started to flare. He had a Lumbar fracture at that time and was managed conservatively.  He says he has slept in the  lazy boy for long time and now has been using temur-pedic bed where he can raise his bed. He has been following Dr Estanislado Pandy, Rheumatologist for his Ankylosing Spondylitis and has been started on weekly Etanercept. He has received 3 doses of it so far. He says he has not noticed any difference in his back pain since being started on Etanercept.  During work up of AS, he was found to have low levels of IgA with normal levels of IgG and IgM, reason why referred to ID. Patient denies having recurrent episodes of sinopulmonary infections as a child. Denies family h/o auto-immune diseases or immunodeficiency disorders. He was diagnosed with Cryptosporidiosis back in 2018 when he had severe episodes of diarrhea which has since resolved spontaneously per patient report. He was also seen by his Dematologist for rashes at some point. He was told to have Scleroderma as well. He is scheduled to have a colonoscopy for blood in his stool.  Denies any fever, chills ,sweats, diarrhea. Denies any appetite or weight change. Denies any nausea, vomiting, abdominal pain. Has dry heaves  occasionally and is constipated  Has nocturia. Denies any burning with urination or hematuria Mild headache. Denies any blurry vision, ear pain/sinus pain  Denies any cough/SOB or chest pain   Drinks 1-2 glasses of wine/whisky everyday. Denies smoking and illicit drug use.   ROS: 10 Point ROS negative except as above  Past Medical History:  Diagnosis Date  . Acute hepatitis 01/26/2019  . Acute kidney failure ([place]) 03/30/2018  . AKI (acute kidney injury) ([place])   . Anginal pain ([place])   . Anxiety   . Arthritis    SPINE   . Arthritis   . Cellulitis 03/30/2018  . Coronary artery disease    s/p multiple percutaneous interventions, PCI 202 for DMI and DES distal RCA and CFX-OM 2006  . Coronary artery disease   . Depression   . GERD (gastroesophageal reflux disease)   . Hyperlipidemia    mixed  . Hyperlipidemia   . Hypertension   . MI (myocardial infarction) ([place])   . Myocardial infarction ([place])   . Peripheral neuropathy    calves and both feet  . Peripheral neuropathy    calves and both feet  . Rhabdomyolysis 02/02/2019  . Severe sepsis with acute organ dysfunction ([place]) 12/03/2016  . Spondylitis, ankylosing ([place])    Past Surgical History:  Procedure Laterality Date  . CARDIAC CATHETERIZATION     stent RCA June3, 2002, Stent OM/PTCA circumflex August 13, 2000  . CORONARY ANGIOPLASTY WITH STENT PLACEMENT     first one in 2006; had another place 3 months ago (August 2013)  . CORONARY ANGIOPLASTY WITH STENT PLACEMENT  03/26/2013   RCA           DR COOPER  . CORONARY ANGIOPLASTY WITH STENT PLACEMENT    . CORONARY ARTERY BYPASS GRAFT N/A 10/29/2013   Procedure: CORONARY ARTERY BYPASS GRAFTING x 4 (LIMA-LAD, SVG-OM, SVG-PD-PL) ENDOSCOPIC VEIN HARVEST RIGHT THIGH;  Surgeon: Gaye Pollack, MD;  Location: [facility] OR;  Service: Open Heart Surgery;  Laterality: N/A;  . CORONARY ARTERY BYPASS GRAFT    . FINGER SURGERY     5  TH     DIGIT RIGHT HAND  . FINGER SURGERY    . INTRAOPERATIVE  TRANSESOPHAGEAL ECHOCARDIOGRAM N/A 10/29/2013   Procedure: INTRAOPERATIVE TRANSESOPHAGEAL ECHOCARDIOGRAM;  Surgeon: Gaye Pollack, MD;  Location[facility] OR;  Service: Open Heart Surgery;  Laterality: N/A;  . KNEE ARTHROSCOPY W/ LASER  2003  . LEFT HEART CATHETERIZATION WITH CORONARY ANGIOGRAM N/A 10/01/2011   Procedure: LEFT HEART CATHETERIZATION WITH CORONARY ANGIOGRAM;  Surgeon: [name], MD;  Location: [facility] CATH LAB;  Service: Cardiovascular;  Laterality: N/A;  . LEFT HEART CATHETERIZATION WITH CORONARY ANGIOGRAM N/A 03/26/2013   Procedure: LEFT HEART CATHETERIZATION WITH CORONARY ANGIOGRAM;  Surgeon: Blane Ohara, MD;  Location: [facility] CATH LAB;  Service: Cardiovascular;  Laterality: N/A;  . LEFT HEART CATHETERIZATION WITH CORONARY ANGIOGRAM N/A 10/25/2013   Procedure: LEFT HEART CATHETERIZATION WITH CORONARY ANGIOGRAM;  Surgeon: Blane Ohara, MD;  Location: [facility] CATH LAB;  Service: Cardiovascular;  Laterality: N/A;    Current Outpatient Medications on File Prior to Visit  Medication Sig Dispense Refill  . amLODipine (NORVASC) 2.5 MG tablet Take 1 tablet (2.5 mg total) by mouth at bedtime. 90 tablet 3  . aspirin 81 MG tablet Take 81 mg by mouth every evening.     Marland Kitchen buPROPion (WELLBUTRIN XL) 300 MG 24 hr tablet Take 300 mg by mouth daily.    . carvedilol (COREG) 12.5 MG tablet Take 1 tablet (12.5 mg total) by mouth 2 (two) times daily with a meal. 180 tablet 3  . cetirizine (ZYRTEC) 10 MG tablet Take 10 mg by mouth daily.    Mariane Baumgarten Calcium (STOOL SOFTENER PO) Take by mouth at bedtime.    . Etanercept (ENBREL MINI) 50 MG/ML SOCT Inject 50 mg into the skin once a week. 12 mL 0  . fluticasone (FLONASE) 50 MCG/ACT nasal spray Place 2 sprays into both nostrils every evening.     Marland Kitchen guaifenesin (HUMIBID E) 400 MG TABS tablet Take 400 mg by mouth at bedtime.     . hydrochlorothiazide (MICROZIDE) 12.5 MG capsule Take 1 capsule (12.5 mg total) by mouth daily. 30 capsule 3  . ibuprofen (ADVIL) 200  MG tablet Take 200 mg by mouth every 6 (six) hours as needed.     Marland Kitchen losartan (COZAAR) 100 MG tablet Take 1 tablet (100 mg total) by mouth at bedtime. 90 tablet 3  . nitroGLYCERIN (NITROSTAT) 0.4 MG SL tablet DISSOLVE 1 TAB UNDER TONGUE FOR CHEST PAIN - IF PAIN REMAINS AFTER 5 MIN, CALL 911 AND REPEAT DOSE. MAX 3 TABS IN 15 MINUTES 25 tablet 0  . pantoprazole (PROTONIX) 40 MG tablet TAKE 1 TABLET BY MOUTH  DAILY 90 tablet 3  . rosuvastatin (CRESTOR) 20 MG tablet Take 1 tablet (20 mg total) by mouth daily. 90 tablet 3  . sildenafil (REVATIO) 20 MG tablet TAKE 2-5 TABLETS BY MOUTH AS NEEDED PRIOR TO SEXUAL ACTIVITY 50 tablet 1  . triamcinolone cream (KENALOG) 0.1 % Apply 1 application topically 2 (two) times daily. 80 g 1  . Vilazodone HCl (VIIBRYD PO) Take 20 mg by mouth daily.     . vitamin B-12 (CYANOCOBALAMIN) 1000 MCG tablet Take 1,000 mcg by mouth daily.     No current facility-administered medications on file prior to visit.   No Known Allergies  Social History   Socioeconomic History  . Marital status: Divorced    Spouse name: n/a  . Number of children: 2  . Years of education: Masters degree  . Highest education level: Master's degree (e.g., MA, MS, MEng, MEd, MSW, MBA)  Occupational History  . 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 Smoking status: Former Smoker    Packs/day: 2.00    Years: 10.00    Pack years: 20.00    Types: Cigarettes  Quit date: 06/30/1979    Years since quitting: 40.5  . Smokeless tobacco: Never Used  . Tobacco comment: no plans to start back  Vaping Use  . Vaping Use: Never used  Substance and Sexual Activity  . Alcohol use: Yes    Alcohol/week: 16.0 standard drinks    Types: 14 Glasses of wine, 2 Shots of liquor per week    Comment: 3-4 glasses of wine at dinner everyday.  . Drug use: No  . Sexual activity: Not Currently    Birth control/protection: Condom  Other Topics Concern  . Not on file  Social  History Narrative   ** Merged History Encounter **       Lives in a one level home with son, and dog, Scientist, physiological. Works out of the house. 2 steps into house. Smoke alarms in house. No grab bars in bathroom. Has some area rugs and throw rugs with backing.      No pets of his own. Restores antique pens, does like to walk and cycle but having problems with peripheral neuropathy.       Firearm is safely stored.      Wears seat belts in vehicle. Does use sunblock and tries to avoid sun due to vitiligo and previous sunburns.      Eats varied diet. Likes protein, fresh fruits and veggies. Watches fats, saturated fats. Drink a lot of water.    Social Determinants of Health   Financial Resource Strain:   . Difficulty of Paying Living Expenses: Not on file  Food Insecurity:   . Worried About Charity fundraiser in the Last Year: Not on file  . Ran Out of Food in the Last Year: Not on file  Transportation Needs:   . Lack of Transportation (Medical): Not on file  . Lack of Transportation (Non-Medical): Not on file  Physical Activity:   . Days of Exercise per Week: Not on file  . Minutes of Exercise per Session: Not on file  Stress:   . Feeling of Stress : Not on file  Social Connections:   . Frequency of Communication with Friends and Family: Not on file  . Frequency of Social Gatherings with Friends and Family: Not on file  . Attends Religious Services: Not on file  . Active Member of Clubs or Organizations: Not on file  . Attends Archivist Meetings: Not on file  . Marital Status: Not on file  Intimate Partner Violence:   . Fear of Current or Ex-Partner: Not on file  . Emotionally Abused: Not on file  . Physically Abused: Not on file  . Sexually Abused: Not on file    Vitals BP (!) 141/84   Pulse 75   Temp (!) 97.5 F (36.4 C) (Oral)   Wt 250 lb (113.4 kg)   BMI 32.10 kg/m    Examination  General - not in acute distress, comfortably sitting in chair HEENT - PEERLA, no  pallor and no icterus, LOWER ANTERIOR INCISORS ARE STAINED BROWN AND HAS CARIES  Chest - b/l clear air entry, no additional sounds CVS- Normal s1s2, RRR Abdomen - Soft, Non tender , non distended Ext- MINIMAL PITTING PEDAL OEDEMA BILATERALLY Neuro: grossly normal Back - NO VERETEBRAL TENDERNESS Skin- VITILIGO+ Psych : calm and cooperative   Recent labs CBC Latest Ref Rng & Units 11/10/2019 10/14/2019 02/08/2019  WBC 3.8 - 10.8 Thousand/uL 5.8 3.9 6.1  Hemoglobin 13.2 - 17.1 g/dL 11.9(L) 11.8(L) 9.4(L)  Hematocrit 38 - 50 % 39.2  37.9 30.1(L)  Platelets 140 - 400 Thousand/uL 149 137(L) 166   CMP Latest Ref Rng & Units 11/10/2019 11/10/2019 10/14/2019  Glucose 65 - 99 mg/dL - 96 106(H)  BUN 7 - 25 mg/dL - 15 10  Creatinine 0.70 - 1.18 mg/dL - 1.08 0.81  Sodium 135 - 146 mmol/L - 136 136  Potassium 3.5 - 5.3 mmol/L - 4.1 4.1  Chloride 98 - 110 mmol/L - 101 102  CO2 20 - 32 mmol/L - 25 18(L)  Calcium 8.6 - 10.3 mg/dL - 9.1 8.8  Total Protein 6.1 - 8.1 g/dL 7.8 7.7 -  Total Bilirubin 0.2 - 1.2 mg/dL - 0.9 -  Alkaline Phos 39 - 117 IU/L - - -  AST 10 - 35 U/L - 41(H) -  ALT 9 - 46 U/L - 29 -     All pertinent labs/Imagings/notes reviewed. All pertinent plain films and CT images have been personally visualized and interpreted; radiology reports have been reviewed. Decision making incorporated into the Impression / Recommendations.

## 2019-12-28 LAB — COMPLETE METABOLIC PANEL WITH GFR
AG Ratio: 0.9 (calc) — ABNORMAL LOW (ref 1.0–2.5)
ALT: 45 U/L (ref 9–46)
AST: 60 U/L — ABNORMAL HIGH (ref 10–35)
Albumin: 3.8 g/dL (ref 3.6–5.1)
Alkaline phosphatase (APISO): 76 U/L (ref 35–144)
BUN: 17 mg/dL (ref 7–25)
CO2: 26 mmol/L (ref 20–32)
Calcium: 9.1 mg/dL (ref 8.6–10.3)
Chloride: 102 mmol/L (ref 98–110)
Creat: 0.86 mg/dL (ref 0.70–1.18)
GFR, Est African American: 102 mL/min/{1.73_m2} (ref >=60)
GFR, Est Non African American: 88 mL/min/{1.73_m2} (ref >=60)
Globulin: 4.2 g/dL (calc) — ABNORMAL HIGH (ref 1.9–3.7)
Glucose, Bld: 95 mg/dL (ref 65–99)
Potassium: 4.3 mmol/L (ref 3.5–5.3)
Sodium: 137 mmol/L (ref 135–146)
Total Bilirubin: 0.4 mg/dL (ref 0.2–1.2)
Total Protein: 8 g/dL (ref 6.1–8.1)

## 2019-12-28 LAB — CBC WITH DIFFERENTIAL/PLATELET
Absolute Monocytes: 682 cells/uL (ref 200–950)
Basophils Absolute: 88 cells/uL (ref 0–200)
Basophils Relative: 1.6 %
Eosinophils Absolute: 413 cells/uL (ref 15–500)
Eosinophils Relative: 7.5 %
HCT: 39.3 % (ref 38.5–50.0)
Hemoglobin: 11.9 g/dL — ABNORMAL LOW (ref 13.2–17.1)
Lymphs Abs: 2101 cells/uL (ref 850–3900)
MCH: 25.6 pg — ABNORMAL LOW (ref 27.0–33.0)
MCHC: 30.3 g/dL — ABNORMAL LOW (ref 32.0–36.0)
MCV: 84.5 fL (ref 80.0–100.0)
MPV: 10.2 fL (ref 7.5–12.5)
Monocytes Relative: 12.4 %
Neutro Abs: 2217 cells/uL (ref 1500–7800)
Neutrophils Relative %: 40.3 %
Platelets: 149 10*3/uL (ref 140–400)
RBC: 4.65 10*6/uL (ref 4.20–5.80)
RDW: 17.8 % — ABNORMAL HIGH (ref 11.0–15.0)
Total Lymphocyte: 38.2 %
WBC: 5.5 10*3/uL (ref 3.8–10.8)

## 2019-12-28 LAB — ANA: Anti Nuclear Antibody (ANA): NEGATIVE

## 2019-12-28 LAB — IGG, IGA, IGM
IgG (Immunoglobin G), Serum: 2752 mg/dL — ABNORMAL HIGH (ref 600–1540)
IgM, Serum: 150 mg/dL (ref 50–300)
Immunoglobulin A: <5 mg/dL — ABNORMAL LOW (ref 70–320)

## 2019-12-28 LAB — SEDIMENTATION RATE: Sed Rate: 17 mm/h (ref 0–20)

## 2019-12-28 LAB — C-REACTIVE PROTEIN: CRP: 2.7 mg/L (ref <8.0)

## 2019-12-28 LAB — RHEUMATOID FACTOR: Rheumatoid fact SerPl-aCnc: <14 IU/mL (ref <14)

## 2019-12-28 LAB — IGE: IgE (Immunoglobulin E), Serum: 7 kU/L (ref <=114)

## 2019-12-30 ENCOUNTER — Encounter: Payer: Self-pay | Admitting: Pharmacist

## 2019-12-30 ENCOUNTER — Ambulatory Visit (INDEPENDENT_AMBULATORY_CARE_PROVIDER_SITE_OTHER): Payer: Medicare Other | Admitting: Pharmacist

## 2019-12-30 ENCOUNTER — Other Ambulatory Visit: Payer: Self-pay

## 2019-12-30 DIAGNOSIS — I10 Essential (primary) hypertension: Secondary | ICD-10-CM | POA: Diagnosis not present

## 2019-12-30 NOTE — Progress Notes (Signed)
   S:    Patient arrives in good spirits for a follow-up visit regarding hypertension evaluation, counseling, and management.  Patient was referred and last seen by Primary Care Provider on 10/14/2019. Last seen by clinical pharmacist on 12/02/19 in which hydrochlorothiazide 12.5mg  was started.  He has a history of ankylosing spondylitis followed by rheumatology. He reports lower back pain that worsens with activity. His chronic pain which has been a suspected source of his high blood pressure. To date, he has had 4 weeks of Enbrel therapy (started 12/02/19).  Patient reports occasional dizziness and headaches but has not had any falls.  Medication adherence reported, denies missed doses.   Current BP Medications include:  - amlodipine 2.5mg  once daily - carvedilol 12.5mg  twice daily - hydrochlorothiazide 12.5mg  once daily - losartan 100mg  once daily  Antihypertensives tried in the past include: - amlodipine 10 mg daily (hypotension); Imdur 30 mg daily (on Viagra PRN); ramipril 10 mg daily  ASCVD risk factors include:HTN, CAD s/p CABG, HLD  O:  Physical Exam  Review of Systems  Cardiovascular: Positive for leg swelling.  Neurological: Positive for dizziness and headaches.  All other systems reviewed and are negative.   Home BP readings:  120/68 122/69 99/64 93/60  80/30 89/47 96/58  139/72 118/72 136/77  Last 3 Office BP readings: BP Readings from Last 3 Encounters:  12/30/19 (!) 122/56  12/27/19 (!) 141/84  12/02/19 (!) 166/89    BMET    Component Value Date/Time   NA 137 12/27/2019 1626   NA 136 10/14/2019 1221   K 4.3 12/27/2019 1626   CL 102 12/27/2019 1626   CO2 26 12/27/2019 1626   GLUCOSE 95 12/27/2019 1626   BUN 17 12/27/2019 1626   BUN 10 10/14/2019 1221   CREATININE 0.86 12/27/2019 1626   CALCIUM 9.1 12/27/2019 1626   GFRNONAA 88 12/27/2019 1626   GFRAA 102 12/27/2019 1626    Renal function: Estimated Creatinine Clearance: 98.5 mL/min (by C-G  formula based on SCr of 0.86 mg/dL).  Clinical ASCVD: N/a The ASCVD Risk score Mikey Bussing DC Jr., et al., 2013) failed to calculate for the following reasons:   The patient has a prior MI or stroke diagnosis  PHQ-2 Score: 0   A/P: Hypertension diagnosed currently controlled on current medications, and patient has had a few hypotensive readings per home documentation. BP in clinic: 122/67mmHg. BP Goal = <130/80 mmHg. Medication adherence reported by patient. Patient has also experienced mild lower extremity edema. Given patient's hypotensive readings and symptoms, will discontinue amlodipine to avoid hypotension and reduce pill burden since BP is now at goal. Will continue all other BP medications.  -Continued hydrochlorothiazide 12.5mg  daily -Continued losartan 100mg  -Continued carvedilol 12.5mg  twice daily -Discontinued amlodipine   -Counseled on lifestyle modifications for blood pressure control including reduced dietary sodium, increased exercise, adequate sleep.   -Instructed patient to continue taking blood pressure at home and documenting readings.  Follow-up with PCP next month.  He is welcome back to pharmacist clinic in the future as needed for additional therapy support.   Results reviewed and written information provided.   Total time in face-to-face counseling 15 minutes.  Patient seen with Mercy Riding, PharmD, PGY-1 Acute Care Resident and Lorel Monaco, PharmD, PGY-2 Ambulatory Care Resident

## 2019-12-30 NOTE — Progress Notes (Signed)
Reviewed: I agree with Dr. Koval's documentation and management. 

## 2019-12-30 NOTE — Patient Instructions (Addendum)
Nice to meet you today!  Continue taking hydrochlorothiazide, losartan and carvedilol. Stop taking amlodipine.  Follow up with Dr. Oleh Genin next month.

## 2019-12-30 NOTE — Assessment & Plan Note (Signed)
Hypertension diagnosed currently controlled on current medications, and patient has had a few hypotensive readings per home documentation. BP in clinic: 122/36mmHg. BP Goal = <130/80 mmHg. Medication adherence reported by patient. Patient has also experienced mild lower extremity edema. Given patient's hypotensive readings and symptoms, will discontinue amlodipine to avoid hypotension and reduce pill burden since BP is now at goal. Will continue all other BP medications.  -Continued hydrochlorothiazide 12.5mg  daily -Continued losartan 100mg  -Continued carvedilol 12.5mg  twice daily -Discontinued amlodipine

## 2020-01-03 ENCOUNTER — Ambulatory Visit: Payer: Medicare Other | Admitting: Pharmacist

## 2020-01-12 ENCOUNTER — Telehealth: Payer: Self-pay | Admitting: *Deleted

## 2020-01-12 NOTE — Telephone Encounter (Signed)
Pt calls to report the that he has stopped the HCTZ and cut the carvedilol back to one tablet daily due to dizziness.  He is still taking the Losartan as directed.  BP readings before stopping meds: 23 - AM 136/80 P:80 24 - AM 150-92 P:88 26 - PM 137/79 P:88 27 - PM 96/61   P:81 28 - AM 140/87 P:80 29 - AM 92/56   P:81 30 - AM 85/53   P:78    75/52  P: 81        PM 101/65 P:81   BP readings after stopping meds: 31 - AM 134/80  P:75 1 - AM 109/65  P:79 3 - AM 146/83 P: 83   Pt denies CP / SOB / Arm pain.  Will call EMS or go to ED immediately if he does.  Pt prefers not to come in this month as he has several other appts but will if that is the recommendation.  Routing to PCP and Dr. Orpah Clinton, CMA

## 2020-01-15 ENCOUNTER — Encounter: Payer: Self-pay | Admitting: Family Medicine

## 2020-01-19 ENCOUNTER — Ambulatory Visit: Payer: Medicare Other | Admitting: Allergy

## 2020-01-19 ENCOUNTER — Encounter: Payer: Self-pay | Admitting: Allergy

## 2020-01-19 ENCOUNTER — Other Ambulatory Visit: Payer: Self-pay

## 2020-01-19 ENCOUNTER — Ambulatory Visit: Payer: Medicare Other | Admitting: Rheumatology

## 2020-01-19 VITALS — BP 110/74 | HR 87 | Temp 98.4°F | Resp 16 | Ht 71.5 in | Wt 254.0 lb

## 2020-01-19 DIAGNOSIS — J310 Chronic rhinitis: Secondary | ICD-10-CM

## 2020-01-19 DIAGNOSIS — D802 Selective deficiency of immunoglobulin A [IgA]: Secondary | ICD-10-CM

## 2020-01-19 DIAGNOSIS — R040 Epistaxis: Secondary | ICD-10-CM | POA: Diagnosis not present

## 2020-01-19 MED ORDER — IPRATROPIUM BROMIDE 0.06 % NA SOLN: NASAL | 5 refills | Status: DC

## 2020-01-19 NOTE — Patient Instructions (Addendum)
Selective IgA Deficiency -You have had undetectable IgA levels with appropriate levels of IgG (elevated level however SPEP is not very remarkable) and IgM.  -We have discussed today the importance of immunoglobulins (Ig) and the role in the immune system  -IgA is most helpful in controlling infection/illness in mucus secreting areas like the sinus tract, lungs, gut tract  -IgA deficiency is the most common form of immunoglobulin deficiency and typically most are asymptomatic.  Unfortunately there is no treatment or replacement products to help improve your IgA levels.  -Autoimmune disease can be associated with selective IgA deficiency and he is following with rheumatologist at this time  -You should continue to get your routine health screenings including colonoscopies as well as routine vaccinations. There is an increased risk of malignancy especially GI malignancies.  -If you need any blood transfusion/blood products in the future recommend you make your medical team aware that you have IgA deficiency as there can be an increase likelihood of blood transfusion reactions.  -You have had a thorough infectious work-up and at this time no further work-up is necessary.  We will plan to follow your IgG and IgM levels to ensure that they maintain appropriate levels once a year.  If you start to have issues with developing recurrent sinus infections, pneumonia or other infections then will need to monitor these levels more frequently.  -You can find more information about selective IgA deficiency at The [place] website [email or web address]  Rhinitis  -Environmental allergy skin testing today is negative  -Stop Flonase use due to nose bleeds at this time  -Use nasal Atrovent 2 sprays each nostril 1-2 times a day as needed for nasal congestion or drainage  -Continue daily nasal saline rinse in shower  -Stop Zyrtec as not effective.  Can try Xyzal 5mg  or Allegra 180mg  daily as  needed if effective.  Both are over-the-counter antihistamines  Follow-up in 1 year or sooner if needed

## 2020-01-19 NOTE — Progress Notes (Signed)
New Patient Note  RE: Gregory Hansen MRN: [id number] DOB: 07-09-1950 Date of Office Visit: 01/19/2020  Referring provider: Bo Merino, MD Primary care provider: Rise Patience, DO  Chief Complaint: IgA deficiency  History of present illness: Gregory Hansen is a 69 y.o. male presenting today for consultation for IgA deficiency.  He has a history of HLA-B27 positive ankylosing spondylitis, vitiligo, CAD status post CABG, hypertension, hyperlipidemia, GERD,  history of cryptosporidiosis.  He follows with rheumatology for his ankylosing spondylitis. He has seen infectious disease for IgA deficiency as well. He has already had a work-up that includes negative hep B surface antigen and core antibody as well as nonreactive HIV test.  His infectious history is largely unremarkable except for his history of cryptosporidiosis.  He is not sure how he got this infection but states he required hospitalization due to dehydration from the profuse diarrhea.  He denies any recurrent sinus infections, pneumonia, abscesses, opportunistic infections.  He has not required IV antibiotics.  He does report having constipation and is taking medication to help with this.    He does report nasal drainage/congestion and his ears get plugged up.  He is using flonase nightly.  He state he does have nosebleeds.  He uses [place] spray and also performs nasal saline rinse daily.  He was taking zyrtec daily however stopped for this appointment and can't tell a different on or off it.    He states he had patch testing in the past and had a blistering type reaction to something in the patch testing but he states this was about 30+ years ago.  Review of systems: Review of Systems  Constitutional: Negative.   HENT: Positive for congestion and nosebleeds.   Eyes: Negative.   Respiratory: Negative.   Cardiovascular: Negative.   Gastrointestinal: Positive for constipation.  Musculoskeletal: Negative.   Skin:        Patient reports having purpura  Neurological: Negative.     All other systems negative unless noted above in HPI  Past medical history: Past Medical History:  Diagnosis Date  . Acute hepatitis 01/26/2019  . Acute kidney failure ([place]) 03/30/2018  . AKI (acute kidney injury) ([place])   . Anginal pain ([place])   . Anxiety   . Arthritis    SPINE   . Arthritis   . Cellulitis 03/30/2018  . Coronary artery disease    s/p multiple percutaneous interventions, PCI 202 for DMI and DES distal RCA and CFX-OM 2006  . Coronary artery disease   . Depression   . GERD (gastroesophageal reflux disease)   . Hyperlipidemia    mixed  . Hyperlipidemia   . Hypertension   . MI (myocardial infarction) ([place])   . Myocardial infarction ([place])   . Peripheral neuropathy    calves and both feet  . Peripheral neuropathy    calves and both feet  . Rhabdomyolysis 02/02/2019  . Severe sepsis with acute organ dysfunction ([place]) 12/03/2016  . Spondylitis, ankylosing ([place])     Past surgical history: Past Surgical History:  Procedure Laterality Date  . CARDIAC CATHETERIZATION     stent RCA June3, 2002, Stent OM/PTCA circumflex August 13, 2000  . CORONARY ANGIOPLASTY WITH STENT PLACEMENT     first one in 2006; had another place 3 months ago (August 2013)  . CORONARY ANGIOPLASTY WITH STENT PLACEMENT  03/26/2013   RCA           DR COOPER  . CORONARY ANGIOPLASTY WITH STENT PLACEMENT    .  CORONARY ARTERY BYPASS GRAFT N/A 10/29/2013   Procedure: CORONARY ARTERY BYPASS GRAFTING x 4 (LIMA-LAD, SVG-OM, SVG-PD-PL) ENDOSCOPIC VEIN HARVEST RIGHT THIGH;  Surgeon: Gaye Pollack, MD;  Location: [facility] OR;  Service: Open Heart Surgery;  Laterality: N/A;  . CORONARY ARTERY BYPASS GRAFT    . FINGER SURGERY     5  TH     DIGIT RIGHT HAND  . FINGER SURGERY    . INTRAOPERATIVE TRANSESOPHAGEAL ECHOCARDIOGRAM N/A 10/29/2013   Procedure: INTRAOPERATIVE TRANSESOPHAGEAL ECHOCARDIOGRAM;  Surgeon: Gaye Pollack, MD;  Location: [facility] OR;  Service:  Open Heart Surgery;  Laterality: N/A;  . KNEE ARTHROSCOPY W/ LASER  2003  . LEFT HEART CATHETERIZATION WITH CORONARY ANGIOGRAM N/A 10/01/2011   Procedure: LEFT HEART CATHETERIZATION WITH CORONARY ANGIOGRAM;  Surgeon: [name], MD;  Location: [facility] CATH LAB;  Service: Cardiovascular;  Laterality: N/A;  . LEFT HEART CATHETERIZATION WITH CORONARY ANGIOGRAM N/A 03/26/2013   Procedure: LEFT HEART CATHETERIZATION WITH CORONARY ANGIOGRAM;  Surgeon: Blane Ohara, MD;  Location[facility] C[facility] LAB;  Service: Cardiovascular;  Laterality: N/A;  . LEFT HEART CATHETERIZATION WITH CORONARY ANGIOGRAM N/A 10/25/2013   Procedure: LEFT HEART CATHETERIZATION WITH CORONARY ANGIOGRAM;  Surgeon: Blane Ohara, MD;  Location[facility] C[facility] LAB;  Service: Cardiovascular;  Laterality: N/A;  . TONSILLECTOMY      Family history:  Family History  Problem Relation Age of Onset  . Breast cancer Mother 78  . Heart disease Father   . Heart disease Brother   . Leukemia Maternal Grandmother   . Heart disease Maternal Grandfather   . Heart disease Other        positive for cardiac disease and both brothers have had cardiac events  . Heart attack Brother   . Allergic rhinitis Neg Hx   . Angioedema Neg Hx   . Asthma Neg Hx   . Atopy Neg Hx   . Immunodeficiency Neg Hx   . Eczema Neg Hx   . Urticaria Neg Hx     Social history: He lives in a home with carpeting in the bedroom with gas heating and central cooling. 1 dog in the home. There is no concern for water damage, mildew or roaches in the home. 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  Medication List: Current Outpatient Medications  Medication Sig Dispense Refill  . aspirin 81 MG tablet Take 81 mg by mouth every evening.     Marland Kitchen buPROPion (WELLBUTRIN XL) 300 MG 24 hr tablet Take 300 mg by mouth daily.    . carvedilol (COREG) 12.5 MG tablet Take 1 tablet (12.5 mg total) by mouth 2 (two) times daily with a meal. 180 tablet 3  .  cetirizine (ZYRTEC) 10 MG tablet Take 10 mg by mouth daily.    Mariane Baumgarten Calcium (STOOL SOFTENER PO) Take by mouth at bedtime.    . Etanercept (ENBREL MINI) 50 MG/ML SOCT Inject 50 mg into the skin once a week. 12 mL 0  . fluticasone (FLONASE) 50 MCG/ACT nasal spray Place 2 sprays into both nostrils every evening.     Marland Kitchen guaifenesin (HUMIBID E) 400 MG TABS tablet Take 400 mg by mouth at bedtime.     Marland Kitchen ibuprofen (ADVIL) 200 MG tablet Take 200 mg by mouth every 6 (six) hours as needed.     Marland Kitchen losartan (COZAAR) 100 MG tablet Take 1 tablet (100 mg total) by mouth at bedtime. 90 tablet 3  . pantoprazole (PROTONIX) 40 MG  tablet TAKE 1 TABLET BY MOUTH  DAILY 90 tablet 3  . rosuvastatin (CRESTOR) 20 MG tablet Take 1 tablet (20 mg total) by mouth daily. 90 tablet 3  . sildenafil (REVATIO) 20 MG tablet TAKE 2-5 TABLETS BY MOUTH AS NEEDED PRIOR TO SEXUAL ACTIVITY 50 tablet 1  . triamcinolone cream (KENALOG) 0.1 % Apply 1 application topically 2 (two) times daily. 80 g 1  . Vilazodone HCl (VIIBRYD PO) Take 20 mg by mouth daily.     . vitamin B-12 (CYANOCOBALAMIN) 1000 MCG tablet Take 1,000 mcg by mouth daily.    Marland Kitchen ipratropium (ATROVENT) 0.06 % nasal spray 2 sprays each nostril 1-2 times a day as needed 15 mL 5   No current facility-administered medications for this visit.    Known medication allergies: No Known Allergies   Physical examination: Blood pressure 110/74, pulse 87, temperature 98.4 F (36.9 C), temperature source Temporal, resp. rate 16, height 5' 11.5" (1.816 m), weight 254 lb (115.2 kg), SpO2 98 %.  General: Alert, interactive, in no acute distress. HEENT: PERRLA, TMs pearly gray, turbinates mildly edematous Dried bloody discharge, post-pharynx non erythematous. Neck: Supple without lymphadenopathy. Lungs: Clear to auscultation without wheezing, rhonchi or rales. {no increased work of breathing. CV: Normal S1, S2 without murmurs. Abdomen: Nondistended, nontender. Skin: Bilateral arms  with several ecchymotic lesions. Antecubital fossa with hypopigmented plaques.. Extremities:  No clubbing, cyanosis or edema. Neuro:   Grossly intact.  Diagnositics/Labs: Labs:  Component     Latest Ref Rng & Units 12/27/2019  WBC     3.8 - 10.8 Thousand/uL 5.5  RBC     4.20 - 5.80 Million/uL 4.65  Hemoglobin     13.2 - 17.1 g/dL 11.9 (L)  HCT     38 - 50 % 39.3  MCV     80.0 - 100.0 fL 84.5  MCH     27.0 - 33.0 pg 25.6 (L)  MCHC     32.0 - 36.0 g/dL 30.3 (L)  RDW     11.0 - 15.0 % 17.8 (H)  Platelets     140 - 400 Thousand/uL 149  MPV     7.5 - 12.5 fL 10.2  NEUT#     1,500 - 7,800 cells/uL 2,217  Lymphocyte #     850 - 3,900 cells/uL 2,101  Absolute Monocytes     200 - 950 cells/uL 682  Eosinophils Absolute     15.0 - 500.0 cells/uL 413  Basophils Absolute     0.0 - 200.0 cells/uL 88  Neutrophils     % 40.3  Total Lymphocyte     % 38.2  Monocytes Relative     % 12.4  Eosinophil     % 7.5  Basophil     % 1.6  Glucose     65 - 99 mg/dL 95  BUN     7 - 25 mg/dL 17  Creatinine     0.70 - 1.18 mg/dL 0.86  GFR, Est Non African American     > OR = 60 mL/min/1.43m 88  GFR, Est African American     > OR = 60 mL/min/1.714m102  BUN/Creatinine Ratio     6 - 22 (calc) NOT APPLICABLE  Sodium     13062 146 mmol/L 137  Potassium     3.5 - 5.3 mmol/L 4.3  Chloride     98 - 110 mmol/L 102  CO2     20 - 32 mmol/L 26  Calcium  8.6 - 10.3 mg/dL 9.1  Total Protein     6.1 - 8.1 g/dL 8.0  Albumin MSPROF     3.6 - 5.1 g/dL 3.8  Globulin     1.9 - 3.7 g/dL (calc) 4.2 (H)  AG Ratio     1.0 - 2.5 (calc) 0.9 (L)  Total Bilirubin     0.2 - 1.2 mg/dL 0.4  Alkaline phosphatase (APISO)     35 - 144 U/L 76  AST     10 - 35 U/L 60 (H)  ALT     9 - 46 U/L 45  Immunoglobulin A     70 - 320 mg/dL <5 (L)  IgG (Immunoglobin G), Serum     600 - 1,540 mg/dL 2,752 (H)  IgM, Serum     50 - 300 mg/dL 150  Anti Nuclear Antibody (ANA)     NEGATIVE NEGATIVE  RA  Latex Turbid.     <14 IU/mL <14  CRP     <8.0 mg/L 2.7  Sed Rate     0 - 20 mm/h 17  IgE (Immunoglobulin E), Serum     <OR=114 kU/L 7   Component     Latest Ref Rng & Units 11/10/2019  Immunoglobulin A     70 - 320 mg/dL <5 (L)  IgG (Immunoglobin G), Serum     600 - 1,540 mg/dL 2,536 (H)  IgM, Serum     50 - 300 mg/dL 142   Component     Latest Ref Rng & Units 11/10/2019        10:23 AM  WBC     3.8 - 10.8 Thousand/uL 5.8  RBC     4.20 - 5.80 Million/uL 4.73  Hemoglobin     13.2 - 17.1 g/dL 11.9 (L)  HCT     38 - 50 % 39.2  MCV     80.0 - 100.0 fL 82.9  MCH     27.0 - 33.0 pg 25.2 (L)  MCHC     32.0 - 36.0 g/dL 30.4 (L)  RDW     11.0 - 15.0 % 17.1 (H)  Platelets     140 - 400 Thousand/uL 149  MPV     7.5 - 12.5 fL 10.7  NEUT#     1,500 - 7,800 cells/uL 3,300  Lymphocyte #     850 - 3,900 cells/uL 1,427  Absolute Monocytes     200 - 950 cells/uL 725  Eosinophils Absolute     15.0 - 500.0 cells/uL 278  Basophils Absolute     0.0 - 200.0 cells/uL 70  Neutrophils     % 56.9  Total Lymphocyte     % 24.6  Monocytes Relative     % 12.5  Eosinophil     % 4.8  Basophil     % 1.2  Glucose     65 - 99 mg/dL 96  BUN     7 - 25 mg/dL 15  Creatinine     0.70 - 1.18 mg/dL 1.08  GFR, Est Non African American     > OR = 60 mL/min/1.46m 69  GFR, Est African American     > OR = 60 mL/min/1.779m80  BUN/Creatinine Ratio     6 - 22 (calc) NOT APPLICABLE  Sodium     13917 146 mmol/L 136  Potassium     3.5 - 5.3 mmol/L 4.1  Chloride     98 - 110 mmol/L 101  CO2     20 - 32 mmol/L 25  Calcium     8.6 - 10.3 mg/dL 9.1  Total Protein     6.1 - 8.1 g/dL 7.7  Albumin MSPROF     3.6 - 5.1 g/dL 3.9  Globulin     1.9 - 3.7 g/dL (calc) 3.8 (H)  AG Ratio     1.0 - 2.5 (calc) 1.0  Total Bilirubin     0.2 - 1.2 mg/dL 0.9  Alkaline phosphatase (APISO)     35 - 144 U/L 90  AST     10 - 35 U/L 41 (H)  ALT     9 - 46 U/L 29  Albumin ELP     3.8 - 4.8 g/dL    Alpha 1     0.2 - 0.3 g/dL   Alpha 2     0.5 - 0.9 g/dL   Beta Globulin     0.4 - 0.6 g/dL   Beta 2     0.2 - 0.5 g/dL   Gamma Globulin     0.8 - 1.7 g/dL   SPE Interp.        QuantiFERON-TB Gold Plus     NEGATIVE NEGATIVE  NIL     IU/mL 0.07  Mitogen-NIL     IU/mL >10.00  TB1-NIL     IU/mL 0.01  TB2-NIL     IU/mL 0.00  Hepatitis C Ab     NON-REACTI NON-REACTIVE  SIGNAL TO CUT-OFF     <1.00 0.06  Sed Rate     0 - 20 mm/h 22 (H)  Hep B Core Ab, IgM     NON-REACTI NON-REACTIVE  Hepatitis B Surface Ag     NON-REACTI NON-REACTIVE  HIV     NON-REACTI NON-REACTIVE   Allergy testing: Environmental allergy skin prick testing is negative with a positive histamine control. Allergy testing results were read and interpreted by provider, documented by clinical staff.   Assessment and plan:   Selective IgA Deficiency  -You have had undetectable IgA levels with appropriate levels of IgG (elevated level however SPEP is not very remarkable) and IgM.  -We have discussed today the importance of immunoglobulins (Ig) and the role in the immune system  -IgA is most helpful in controlling infection/illness in mucus secreting areas like the sinus tract, lungs, gut tract  -IgA deficiency is the most common form of immunoglobulin deficiency and typically most are asymptomatic.  Unfortunately there is no treatment or replacement products to help improve your IgA levels.  -Autoimmune disease can be associated with selective IgA deficiency and he is following with rheumatologist at this time  -You should continue to get your routine health screenings including colonoscopies as well as routine vaccinations. There is an increased risk of malignancy especially GI malignancies.  -If you need any blood transfusion/blood products in the future recommend you make your medical team aware that you have IgA deficiency as there can be an increase likelihood of blood transfusion reactions.  -You have had a  thorough infectious work-up and at this time no further work-up is necessary.  We will plan to follow your IgG and IgM levels to ensure that they maintain appropriate levels once a year.  If you start to have issues with developing recurrent sinus infections, pneumonia or other infections then will need to monitor these levels more frequently.  -You can find more information about selective IgA deficiency at The [place] website [email or web address]  Rhinitis, nonallergic Epistaxis  -  Environmental allergy skin testing today is negative  -Stop Flonase use due to nose bleeds at this time  -Use nasal Atrovent 2 sprays each nostril 1-2 times a day as needed for nasal congestion or drainage  -Continue daily nasal saline rinse in shower  -Stop Zyrtec as not effective.  Can try Xyzal 43m or Allegra 1877mdaily as needed if effective.  Both are over-the-counter antihistamines  Follow-up in 1 year or sooner if needed  I appreciate the opportunity to take part in [place]. Please do not hesitate to contact me with questions.  Sincerely,   ShPrudy FeelerMD Allergy/Immunology [facility] [place]

## 2020-01-24 ENCOUNTER — Ambulatory Visit (AMBULATORY_SURGERY_CENTER): Payer: Self-pay | Admitting: *Deleted

## 2020-01-24 ENCOUNTER — Other Ambulatory Visit: Payer: Self-pay

## 2020-01-24 ENCOUNTER — Encounter: Payer: Self-pay | Admitting: Gastroenterology

## 2020-01-24 VITALS — Ht 71.5 in | Wt 245.0 lb

## 2020-01-24 DIAGNOSIS — R195 Other fecal abnormalities: Secondary | ICD-10-CM

## 2020-01-24 MED ORDER — PLENVU 140 G PO SOLR: 1.0000 | ORAL | 0 refills | Status: DC

## 2020-01-24 NOTE — Progress Notes (Signed)
No egg or soy allergy known to patient  No issues with past sedation with any surgeries or procedures no intubation problems in the past  No FH of Malignant Hyperthermia No diet pills per patient No home 02 use per patient  No blood thinners per patient  Pt states  issues with constipation - he uses stool softener daily- he will alternate constipation with Diarrhea- generally has a daily BM since on stool softener  No A fib or A flutter  EMMI video to pt or via [place] guidelines implemented in PV today with Pt and RN   Full vaccinated  [employer]  Coupon given to pt in PV today , Code to Pharmacy   Due to the COVID-19 pandemic we are asking patients to follow these guidelines. Please only bring one care partner. Please be aware that your care partner may wait in the car in the parking lot or if they feel like they will be too hot to wait in the car, they may wait in the lobby on the 4th floor. All care partners are required to wear a mask the entire time (we do not have any that we can provide them), they need to practice social distancing, and we will do a Covid check for all patient's and care partners when you arrive. Also we will check their temperature and your temperature. If the care partner waits in their car they need to stay in the parking lot the entire time and we will call them on their cell phone when the patient is ready for discharge so they can bring the car to the front of the building. Also all patient's will need to wear a mask into building.

## 2020-01-27 NOTE — Progress Notes (Signed)
Office Visit Note  Patient: Gregory Hansen             Date of Birth: 1950/07/29           MRN: [id number]             PCP: Rise Patience, DO Referring: Myles Gip, DO Visit Date: 01/31/2020 Occupation: '@GUAROCC' @  Subjective:  Other (back pain )   History of Present Illness: Gregory Hansen is a 69 y.o. male with history of ankylosing spondylitis, osteoarthritis and degenerative disc disease.  He was a started on Enbrel September 30 10/2019.  He has not noticed any improvement since he has been on Enbrel.  He states he used to have SI joint pain which she is a still having on the left side.  He continues to have pain and discomfort in his bilateral knee joints.  He has not noticed any joint swelling and he did not have joint swelling  prior to  starting Enbrel.  He has not had any flares of iritis in the last 20 years.  Activities of Daily Living:  Patient reports morning stiffness for 0 minutes.   Patient Denies nocturnal pain.  Difficulty dressing/grooming: Denies Difficulty climbing stairs: Denies Difficulty getting out of chair: Denies Difficulty using hands for taps, buttons, cutlery, and/or writing: Denies  Review of Systems  Constitutional: Negative for fatigue.  HENT: Positive for mouth dryness and nose dryness. Negative for mouth sores.   Eyes: Positive for itching. Negative for pain and dryness.  Respiratory: Negative for shortness of breath and difficulty breathing.   Cardiovascular: Positive for swelling in legs/feet. Negative for chest pain and palpitations.  Gastrointestinal: Positive for constipation. Negative for blood in stool and diarrhea.  Endocrine: Negative for increased urination.  Genitourinary: Negative for difficulty urinating.  Musculoskeletal: Positive for arthralgias, joint pain and joint swelling. Negative for myalgias, morning stiffness, muscle tenderness and myalgias.  Skin: Positive for rash. Negative for color change.  Allergic/Immunologic:  Negative for susceptible to infections.  Neurological: Positive for numbness and weakness. Negative for dizziness, headaches and memory loss.  Hematological: Negative for bruising/bleeding tendency.  Psychiatric/Behavioral: Negative for confusion.    PMFS History:  Patient Active Problem List   Diagnosis Date Noted  . Alcohol use disorder, mild, abuse 10/14/2019  . Chronic bilateral low back pain without sciatica 07/13/2019  . Numbness and tingling in right hand 07/13/2019  . Fall at home, initial encounter 02/02/2019  . Leg cramping 12/22/2018  . Rash 05/23/2018  . Peripheral neuropathy 11/04/2017  . Gynecomastia, male 02/18/2017  . Healthcare maintenance 01/20/2017  . Aortic atherosclerosis ([place]) 12/03/2016  . History of cryptosporidiosis   . S/P CABG x 4 10/29/2013  . Hyperlipidemia 06/15/2008  . Depression, major, single episode, mild ([place]) 06/15/2008  . HTN, goal below 140/90 06/15/2008  . CAD (coronary artery disease) 06/15/2008  . SPONDYLITIS, ANKYLOSING 06/15/2008  . Atherosclerotic heart disease of native coronary artery without angina pectoris 06/15/2008  . Gastro-esophageal reflux disease without esophagitis 06/15/2008  . Major depressive disorder, single episode 06/15/2008    Past Medical History:  Diagnosis Date  . Acute hepatitis 01/26/2019  . Acute kidney failure ([place]) 03/30/2018  . AKI (acute kidney injury) ([place])   . Anginal pain ([place])   . Anxiety   . Arthritis    SPINE   . Arthritis   . Cellulitis 03/30/2018  . Coronary artery disease    s/p multiple percutaneous interventions, PCI 202 for DMI and DES distal RCA and  CFX-OM 2006  . Coronary artery disease   . Depression   . GERD (gastroesophageal reflux disease)   . Hyperlipidemia    mixed  . Hyperlipidemia   . Hypertension   . MI (myocardial infarction) ([place])   . Myocardial infarction [facility])    ? 2006  . Peripheral neuropathy    calves and both feet  . Peripheral neuropathy    calves and both feet   . Rhabdomyolysis 02/02/2019  . Severe sepsis with acute organ dysfunction ([place]) 12/03/2016  . Spondylitis, ankylosing (HCC)     Family History  Problem Relation Age of Onset  . Breast cancer Mother 18  . Heart disease Father   . Heart disease Brother   . Leukemia Maternal Grandmother   . Heart disease Maternal Grandfather   . Heart disease Other        positive for cardiac disease and both brothers have had cardiac events  . Heart attack Brother   . Allergic rhinitis Neg Hx   . Angioedema Neg Hx   . Asthma Neg Hx   . Atopy Neg Hx   . Immunodeficiency Neg Hx   . Eczema Neg Hx   . Urticaria Neg Hx   . Colon cancer Neg Hx   . Colon polyps Neg Hx   . Esophageal cancer Neg Hx   . Rectal cancer Neg Hx   . Stomach cancer Neg Hx    Past Surgical History:  Procedure Laterality Date  . ACHILLES TENDON REPAIR    . CARDIAC CATHETERIZATION     stent RCA June3, 2002, Stent OM/PTCA circumflex August 13, 2000  . CORONARY ANGIOPLASTY WITH STENT PLACEMENT     first one in 2006; had another place 3 months ago (August 2013)  . CORONARY ANGIOPLASTY WITH STENT PLACEMENT  03/26/2013   RCA           DR COOPER  . CORONARY ANGIOPLASTY WITH STENT PLACEMENT    . CORONARY ARTERY BYPASS GRAFT N/A 10/29/2013   Procedure: CORONARY ARTERY BYPASS GRAFTING x 4 (LIMA-LAD, SVG-OM, SVG-PD-PL) ENDOSCOPIC VEIN HARVEST RIGHT THIGH;  Surgeon: Gaye Pollack, MD;  Location: [facility] OR;  Service: Open Heart Surgery;  Laterality: N/A;  . CORONARY ARTERY BYPASS GRAFT    . FINGER SURGERY     5  TH     DIGIT RIGHT HAND  . FINGER SURGERY    . INTRAOPERATIVE TRANSESOPHAGEAL ECHOCARDIOGRAM N/A 10/29/2013   Procedure: INTRAOPERATIVE TRANSESOPHAGEAL ECHOCARDIOGRAM;  Surgeon: Gaye Pollack, MD;  Location: [facility] OR;  Service: Open Heart Surgery;  Laterality: N/A;  . KNEE ARTHROSCOPY W/ LASER  2003  . LEFT HEART CATHETERIZATION WITH CORONARY ANGIOGRAM N/A 10/01/2011   Procedure: LEFT HEART CATHETERIZATION WITH CORONARY ANGIOGRAM;   Surgeon: [name], MD;  Location: [facility] CATH LAB;  Service: Cardiovascular;  Laterality: N/A;  . LEFT HEART CATHETERIZATION WITH CORONARY ANGIOGRAM N/A 03/26/2013   Procedure: LEFT HEART CATHETERIZATION WITH CORONARY ANGIOGRAM;  Surgeon: Blane Ohara, MD;  Location: [facility] CATH LAB;  Service: Cardiovascular;  Laterality: N/A;  . LEFT HEART CATHETERIZATION WITH CORONARY ANGIOGRAM N/A 10/25/2013   Procedure: LEFT HEART CATHETERIZATION WITH CORONARY ANGIOGRAM;  Surgeon: Blane Ohara, MD;  Location: [facility] C[facility] LAB;  Service: Cardiovascular;  Laterality: N/A;  . TONSILLECTOMY     Social History   Social History Narrative   ** Merged History Encounter **       Lives in a one level home with son, and dog, Scientist, physiological. Works out of the house. 2  steps into house. Smoke alarms in house. No grab bars in bathroom. Has some area rugs and throw rugs with backing.      No pets of his own. Restores antique pens, does like to walk and cycle but having problems with peripheral neuropathy.       Firearm is safely stored.      Wears seat belts in vehicle. Does use sunblock and tries to avoid sun due to vitiligo and previous sunburns.      Eats varied diet. Likes protein, fresh fruits and veggies. Watches fats, saturated fats. Drink a lot of water.    Immunization History  Administered Date(s) Administered  . Influenza, High Dose Seasonal PF 11/23/2016  . Influenza,inj,Quad PF,6+ Mos 12/18/2017, 11/20/2018  . Influenza-Unspecified 12/02/2019  . PFIZER SARS-COV-2 Vaccination 05/03/2019, 05/24/2019  . Pneumococcal Conjugate-13 01/20/2017  . Pneumococcal Polysaccharide-23 07/01/2015, 04/01/2018  . Tdap 06/30/2015  . Zoster Recombinat (Shingrix) 06/28/2019, 09/13/2019     Objective: Vital Signs: BP 116/74 (BP Location: Left Arm, Patient Position: Sitting, Cuff Size: Large)   Pulse 93   Resp 17   Ht 6' (1.829 m)   Wt 251 lb (113.9 kg)   BMI 34.04 kg/m    Physical Exam Vitals and nursing note reviewed.   Constitutional:      Appearance: He is well-developed.  HENT:     Head: Normocephalic and atraumatic.  Eyes:     Conjunctiva/sclera: Conjunctivae normal.     Pupils: Pupils are equal, round, and reactive to light.  Cardiovascular:     Rate and Rhythm: Normal rate and regular rhythm.     Heart sounds: Normal heart sounds.  Pulmonary:     Effort: Pulmonary effort is normal.     Breath sounds: Normal breath sounds.  Abdominal:     General: Bowel sounds are normal.     Palpations: Abdomen is soft.  Musculoskeletal:     Cervical back: Normal range of motion and neck supple.  Skin:    General: Skin is warm and dry.     Capillary Refill: Capillary refill takes less than 2 seconds.  Neurological:     Mental Status: He is alert and oriented to person, place, and time.  Psychiatric:        Behavior: Behavior normal.      Musculoskeletal Exam: He has limited range of motion of his cervical spine.  He has some tenderness over left SI joint.  Shoulder joints, elbow joints, wrist joints with good range of motion.  He has some PIP and DIP thickening with no synovitis.  Hip joints with good range of motion.  He had warmth on palpation of his left knee joint and small effusion.  He had swelling over his right ankle joint without discomfort.  Warmth was noted over the right ankle joint.   CDAI Exam: CDAI Score: 2  Patient Global: 5 mm; Provider Global: 5 mm Swollen: 2 ; Tender: 1  Joint Exam 01/31/2020      Right  Left  Sacroiliac      Tender  Knee     Swollen   Ankle  Swollen         Investigation: No additional findings.  Imaging: No results found.  Recent Labs: Lab Results  Component Value Date   WBC 5.5 12/27/2019   HGB 11.9 (L) 12/27/2019   PLT 149 12/27/2019   NA 137 12/27/2019   K 4.3 12/27/2019   CL 102 12/27/2019   CO2 26 12/27/2019   GLUCOSE 95  12/27/2019   BUN 17 12/27/2019   CREATININE 0.86 12/27/2019   BILITOT 0.4 12/27/2019   ALKPHOS 141 (H) 02/25/2019    AST 60 (H) 12/27/2019   ALT 45 12/27/2019   PROT 8.0 12/27/2019   ALBUMIN 3.4 (L) 02/25/2019   CALCIUM 9.1 12/27/2019   GFRAA 102 12/27/2019   QFTBGOLDPLUS NEGATIVE 11/10/2019   November 10, 2019 SPEP consistent with chronic inflammatory pattern, TB Gold negative, hepatitis B-, hepatitis C negative,IgG elevated, IgA<5, IgM normal, HIV negative, sed rate 22, HLA-B27 positive  December 27, 2019 ESR 17, CRP normal, RF negative, ANA negative  Speciality Comments: Enbrel started December 09, 2019  Procedures:  No procedures performed Allergies: Patient has no known allergies.   Assessment / Plan:     Visit Diagnoses: Ankylosing spondylitis, unspecified site of spine (HCC)-he denies having much discomfort from ankylosing spondylitis.  He states he has not noticed much improvement since he has been on Enbrel which has been almost 2 months.  On my examination he still had some warmth and effusion in his left knee joint and swelling in his right ankle joint.    Chronic SI joint pain - Still with left-sided SI joint pain.  He has not noticed any improvement in the SI joint symptoms.  History of iritis - No recurrence in the last 20 years  High risk medication use - Enbrel 50 mg subcu weekly started December 09, 2019.  His LFTs are still elevated but improved.  Elevated LFTs-he states he quit taking ibuprofen after the last visit.  He still drinks alcohol.  Abstinence from alcohol was discussed.  DDD (degenerative disc disease), thoracic-chronic discomfort  Arthropathy of lumbar facet joint-he has off-and-on discomfort in his lower back.  Primary osteoarthritis of both knees - Bilateral severe osteoarthritis and severe chondromalacia patella.  He states he had left knee joint arthroscopic surgery in the past.  He still had some warmth on his left knee joint and a small effusion.  Right ankle swelling-his right ankle joint is thickened and warm.  He states he had Achilles tendon surgery on  his right ankle joint since then he has had swelling in his right ankle.  IgA deficiency, selective (HCC)-he was evaluated by ID and immunology.  No interventions are needed.  History of vertebral fracture - CT lumbar 01/26/19: acute predominantly horizontally oriented fracture of L1 vertebral body.  Due to injury  Vitiligo  Essential hypertension-his blood pressure is well controlled.  Coronary artery disease involving native coronary artery of native heart without angina pectoris  S/P CABG x 4  Gastro-esophageal reflux disease without esophagitis  Peripheral polyneuropathy  History of cryptosporidiosis - GI, no recurrence  History of depression  History of alcohol use disorder-abstinence from alcohol was discussed.  He is fully vaccinated against COVID-19 and also received a booster.  Orders: No orders of the defined types were placed in this encounter.  No orders of the defined types were placed in this encounter.     Follow-Up Instructions: Return in about 3 months (around 05/02/2020) for Ankylosing spondylitis.   Bo Merino, MD  Note - This record has been created using Editor, commissioning.  Chart creation errors have been sought, but may not always  have been located. Such creation errors do not reflect on  the standard of medical care.

## 2020-01-31 ENCOUNTER — Encounter: Payer: Self-pay | Admitting: Rheumatology

## 2020-01-31 ENCOUNTER — Other Ambulatory Visit: Payer: Self-pay

## 2020-01-31 ENCOUNTER — Ambulatory Visit: Payer: Medicare Other | Admitting: Rheumatology

## 2020-01-31 VITALS — BP 116/74 | HR 93 | Resp 17 | Ht 72.0 in | Wt 251.0 lb

## 2020-01-31 DIAGNOSIS — Z8659 Personal history of other mental and behavioral disorders: Secondary | ICD-10-CM

## 2020-01-31 DIAGNOSIS — Z8669 Personal history of other diseases of the nervous system and sense organs: Secondary | ICD-10-CM | POA: Diagnosis not present

## 2020-01-31 DIAGNOSIS — Z8619 Personal history of other infectious and parasitic diseases: Secondary | ICD-10-CM

## 2020-01-31 DIAGNOSIS — Z8781 Personal history of (healed) traumatic fracture: Secondary | ICD-10-CM

## 2020-01-31 DIAGNOSIS — K219 Gastro-esophageal reflux disease without esophagitis: Secondary | ICD-10-CM

## 2020-01-31 DIAGNOSIS — L80 Vitiligo: Secondary | ICD-10-CM

## 2020-01-31 DIAGNOSIS — G8929 Other chronic pain: Secondary | ICD-10-CM

## 2020-01-31 DIAGNOSIS — Z79899 Other long term (current) drug therapy: Secondary | ICD-10-CM | POA: Diagnosis not present

## 2020-01-31 DIAGNOSIS — Z87898 Personal history of other specified conditions: Secondary | ICD-10-CM

## 2020-01-31 DIAGNOSIS — R7989 Other specified abnormal findings of blood chemistry: Secondary | ICD-10-CM | POA: Diagnosis not present

## 2020-01-31 DIAGNOSIS — M459 Ankylosing spondylitis of unspecified sites in spine: Secondary | ICD-10-CM

## 2020-01-31 DIAGNOSIS — I2510 Atherosclerotic heart disease of native coronary artery without angina pectoris: Secondary | ICD-10-CM

## 2020-01-31 DIAGNOSIS — G629 Polyneuropathy, unspecified: Secondary | ICD-10-CM

## 2020-01-31 DIAGNOSIS — M533 Sacrococcygeal disorders, not elsewhere classified: Secondary | ICD-10-CM | POA: Diagnosis not present

## 2020-01-31 DIAGNOSIS — M25471 Effusion, right ankle: Secondary | ICD-10-CM

## 2020-01-31 DIAGNOSIS — M170 Bilateral primary osteoarthritis of knee: Secondary | ICD-10-CM

## 2020-01-31 DIAGNOSIS — M47816 Spondylosis without myelopathy or radiculopathy, lumbar region: Secondary | ICD-10-CM

## 2020-01-31 DIAGNOSIS — I10 Essential (primary) hypertension: Secondary | ICD-10-CM

## 2020-01-31 DIAGNOSIS — D802 Selective deficiency of immunoglobulin A [IgA]: Secondary | ICD-10-CM

## 2020-01-31 DIAGNOSIS — M5134 Other intervertebral disc degeneration, thoracic region: Secondary | ICD-10-CM

## 2020-01-31 DIAGNOSIS — Z951 Presence of aortocoronary bypass graft: Secondary | ICD-10-CM

## 2020-01-31 NOTE — Telephone Encounter (Signed)
Follow-up call to patient RE blood pressure control.   Patient states he tried to return our multiple calls made by pharmacy resident Dimple Nanas, PharmD but was unable to navigate our phone system to "upload BP readings".   He stated that his blood pressure was just checked at the rheumatologist visit this AM and it was: 116/74  He reports no complaints with therapy and is satisfied with his BP control    No change in therapy made today as he was in rheumatology office. He plans follow-up visit with PCP in 2-4 weeks (following her rheumatology appt and colonoscopy).  He looks forward to talking about his blood pressure at that time.   - Reevaluate use of carvedilol BID dosing.  Make sure patient is taking BID and not daily at that time.   Please share additional comments or thoughts with me in the future PRN regarding BP control.  I do not plan to follow-up additionally at this time.

## 2020-01-31 NOTE — Patient Instructions (Signed)
Standing Labs We placed an order today for your standing lab work.   Please have your standing labs drawn in January and every 3 months   If possible, please have your labs drawn 2 weeks prior to your appointment so that the provider can discuss your results at your appointment.  We have open lab daily Monday through Thursday from 8:30-12:30 PM and 1:30-4:30 PM and Friday from 8:30-12:30 PM and 1:30-4:00 PM at the office of Dr. Henryk Ursin, [facility].   Please be advised, patients with office appointments requiring lab work will take precedents over walk-in lab work.  If possible, please come for your lab work on Monday and Friday afternoons, as you may experience shorter wait times. The office is located at [address], [address], [place], [place] [address] No appointment is necessary.   Labs are drawn by [facility]. Please bring your co-pay at the time of your lab draw.  You may receive a bill from [facility] for your lab work.  If you wish to have your labs drawn at another location, please call the office 24 hours in advance to send orders.  If you have any questions regarding directions or hours of operation,  please call [phone].   As a reminder, please drink plenty of water prior to coming for your lab work. Thanks!   

## 2020-02-07 ENCOUNTER — Other Ambulatory Visit: Payer: Self-pay

## 2020-02-07 ENCOUNTER — Encounter: Payer: Self-pay | Admitting: Gastroenterology

## 2020-02-07 ENCOUNTER — Ambulatory Visit (AMBULATORY_SURGERY_CENTER): Payer: Medicare Other | Admitting: Gastroenterology

## 2020-02-07 ENCOUNTER — Other Ambulatory Visit: Payer: Self-pay | Admitting: Family Medicine

## 2020-02-07 VITALS — BP 161/91 | HR 74 | Temp 96.6°F | Resp 16 | Ht 71.5 in | Wt 245.0 lb

## 2020-02-07 DIAGNOSIS — D124 Benign neoplasm of descending colon: Secondary | ICD-10-CM | POA: Diagnosis not present

## 2020-02-07 DIAGNOSIS — K5730 Diverticulosis of large intestine without perforation or abscess without bleeding: Secondary | ICD-10-CM

## 2020-02-07 DIAGNOSIS — D123 Benign neoplasm of transverse colon: Secondary | ICD-10-CM | POA: Diagnosis not present

## 2020-02-07 DIAGNOSIS — Z1211 Encounter for screening for malignant neoplasm of colon: Secondary | ICD-10-CM | POA: Diagnosis not present

## 2020-02-07 DIAGNOSIS — K648 Other hemorrhoids: Secondary | ICD-10-CM | POA: Diagnosis not present

## 2020-02-07 DIAGNOSIS — R195 Other fecal abnormalities: Secondary | ICD-10-CM | POA: Diagnosis not present

## 2020-02-07 MED ORDER — SODIUM CHLORIDE 0.9 % IV SOLN: 500.0000 mL | Freq: Once | INTRAVENOUS | Status: DC

## 2020-02-07 NOTE — Progress Notes (Signed)
Called to room to assist during endoscopic procedure.  Patient ID and intended procedure confirmed with present staff. Received instructions for my participation in the procedure from the performing physician.  

## 2020-02-07 NOTE — Op Note (Signed)
[facility] Patient Name: Gregory Hansen Procedure Date: 02/07/2020 9:43 AM MRN: [id number] Endoscopist: Milus Banister , MD Age: 69 Referring MD:  Date of Birth: 11-Apr-1950 Gender: Male Account #: [id number] Procedure:                Colonoscopy Indications:              Heme positive stool Medicines:                Monitored Anesthesia Care Procedure:                Pre-Anesthesia Assessment:                           - Prior to the procedure, a History and Physical                            was performed, and patient medications and                            allergies were reviewed. The patient's tolerance of                            previous anesthesia was also reviewed. The risks                            and benefits of the procedure and the sedation                            options and risks were discussed with the patient.                            All questions were answered, and informed consent                            was obtained. Prior Anticoagulants: The patient has                            taken no previous anticoagulant or antiplatelet                            agents. ASA Grade Assessment: III - A patient with                            severe systemic disease. After reviewing the risks                            and benefits, the patient was deemed in                            satisfactory condition to undergo the procedure.                           After obtaining informed consent, the colonoscope  was passed under direct vision. Throughout the                            procedure, the patient's blood pressure, pulse, and                            oxygen saturations were monitored continuously. The                            Colonoscope was introduced through the anus and                            advanced to the the cecum, identified by                            appendiceal orifice and ileocecal valve. The                             colonoscopy was performed without difficulty. The                            patient tolerated the procedure well. The quality                            of the bowel preparation was good. The ileocecal                            valve, appendiceal orifice, and rectum were                            photographed. Scope In: 10:01:07 AM Scope Out: 10:13:51 AM Scope Withdrawal Time: 0 hours 10 minutes 47 seconds  Total Procedure Duration: 0 hours 12 minutes 44 seconds  Findings:                 Four sessile polyps were found in the descending                            colon and transverse colon. The polyps were 2 to 5                            mm in size. These polyps were removed with a cold                            snare. Resection and retrieval were complete.                           Multiple small and large-mouthed diverticula were                            found in the left colon.                           Internal hemorrhoids were found. The hemorrhoids  were small.                           The exam was otherwise without abnormality on                            direct and retroflexion views. Complications:            No immediate complications. Estimated blood loss:                            None. Estimated Blood Loss:     Estimated blood loss: none. Impression:               - Four 2 to 5 mm polyps in the descending colon and                            in the transverse colon, removed with a cold snare.                            Resected and retrieved.                           - Diverticulosis in the left colon.                           - Internal hemorrhoids.                           - The examination was otherwise normal on direct                            and retroflexion views. Recommendation:           - Patient has a contact number available for                            emergencies. The signs and symptoms of  potential                            delayed complications were discussed with the                            patient. Return to normal activities tomorrow.                            Written discharge instructions were provided to the                            patient.                           - Resume previous diet.                           - Continue present medications.                           -  Await pathology results. Milus Banister, MD 02/07/2020 10:16:10 AM This report has been signed electronically.

## 2020-02-07 NOTE — Telephone Encounter (Signed)
Requested medication (s) are due for refill today:   Provider to decide  Requested medication (s) are on the active medication list:   Yes  Future visit scheduled:   No   Last ordered: 02/25/2019, #90, 3 refills by Dr. Ky Barban  Returned for Dr. Ky Barban to determine refill.   Looks like he was seen by Dr. Ky Barban at her prior practice, maybe.   Requested Prescriptions  Pending Prescriptions Disp Refills   rosuvastatin (CRESTOR) 20 MG tablet [Pharmacy Med Name: Rosuvastatin Calcium 20 MG Oral Tablet] 90 tablet 3    Sig: TAKE 1 TABLET BY MOUTH  DAILY      There is no refill protocol information for this order

## 2020-02-07 NOTE — Progress Notes (Signed)
pt tolerated well. VSS. awake and to recovery. Report given to RN.  

## 2020-02-07 NOTE — Telephone Encounter (Signed)
Not our patient

## 2020-02-07 NOTE — Progress Notes (Signed)
previsit done by EM.  No changes in health hx since visit.  VS done by CW

## 2020-02-07 NOTE — Patient Instructions (Signed)
4 polyps removed and sent to pathology.  Diverticulosis and internal hemorrhoids.  Resume previous medications.  Await pathology for final recommendations.  Handouts on findings given to patient.    YOU HAD AN ENDOSCOPIC PROCEDURE TODAY AT [facility]:   Refer to the procedure report that was given to you for any specific questions about what was found during the examination.  If the procedure report does not answer your questions, please call your gastroenterologist to clarify.  If you requested that your care partner not be given the details of your procedure findings, then the procedure report has been included in a sealed envelope for you to review at your convenience later.  YOU SHOULD EXPECT: Some feelings of bloating in the abdomen. Passage of more gas than usual.  Walking can help get rid of the air that was put into your GI tract during the procedure and reduce the bloating. If you had a lower endoscopy (such as a colonoscopy or flexible sigmoidoscopy) you may notice spotting of blood in your stool or on the toilet paper. If you underwent a bowel prep for your procedure, you may not have a normal bowel movement for a few days.  Please Note:  You might notice some irritation and congestion in your nose or some drainage.  This is from the oxygen used during your procedure.  There is no need for concern and it should clear up in a day or so.  SYMPTOMS TO REPORT IMMEDIATELY:   Following lower endoscopy (colonoscopy or flexible sigmoidoscopy):  Excessive amounts of blood in the stool  Significant tenderness or worsening of abdominal pains  Swelling of the abdomen that is new, acute  Fever of 100F or higher    For urgent or emergent issues, a gastroenterologist can be reached at any hour by calling [phone]. Do not use MyChart messaging for urgent concerns.    DIET:  We do recommend a small meal at first, but then you may proceed to your regular diet.  Drink plenty  of fluids but you should avoid alcoholic beverages for 24 hours.  ACTIVITY:  You should plan to take it easy for the rest of today and you should NOT DRIVE or use heavy machinery until tomorrow (because of the sedation medicines used during the test).    FOLLOW UP: Our staff will call the number listed on your records 48-72 hours following your procedure to check on you and address any questions or concerns that you may have regarding the information given to you following your procedure. If we do not reach you, we will leave a message.  We will attempt to reach you two times.  During this call, we will ask if you have developed any symptoms of COVID 19. If you develop any symptoms (ie: fever, flu-like symptoms, shortness of breath, cough etc.) before then, please call [phone].  If you test positive for Covid 19 in the 2 weeks post procedure, please call and report this information to [place].    If any biopsies were taken you will be contacted by phone or by letter within the next 1-3 weeks.  Please call us at [phone] if you have not heard about the biopsies in 3 weeks.    SIGNATURES/CONFIDENTIALITY: You and/or your care partner have signed paperwork which will be entered into your electronic medical record.  These signatures attest to the fact that that the information above on your After Visit Summary has been reviewed and is understood.  Full responsibility of the confidentiality of this discharge information lies with you and/or your care-partner.

## 2020-02-09 ENCOUNTER — Telehealth: Payer: Self-pay | Admitting: *Deleted

## 2020-02-09 NOTE — Telephone Encounter (Signed)
Attempted f/u phone call. No answer. Left message. °

## 2020-02-09 NOTE — Telephone Encounter (Signed)
Follow up call, without answer. VM left.

## 2020-02-14 ENCOUNTER — Other Ambulatory Visit: Payer: Self-pay

## 2020-02-14 MED ORDER — ROSUVASTATIN CALCIUM 20 MG PO TABS
20.0000 mg | ORAL_TABLET | Freq: Every day | ORAL | 3 refills | Status: DC
Start: 2020-02-14 — End: 2021-02-20

## 2020-02-15 ENCOUNTER — Encounter: Payer: Self-pay | Admitting: Gastroenterology

## 2020-02-15 ENCOUNTER — Telehealth: Payer: Self-pay | Admitting: Pharmacist

## 2020-02-15 NOTE — Telephone Encounter (Signed)
Received renewal application from [employer] for ENBREL Mini 50mg  once weekly patient assistance. Faxed completed application (including patient and provider portions) to [employer].  Fax: [phone] Phone: [phone]  Last CBC/CMP 12/27/19. Due 03/28/20. TB gold negative on 11/10/19.  Knox Saliva, PharmD, MPH Clinical Pharmacist (Rheumatology and Pulmonology)

## 2020-02-24 ENCOUNTER — Other Ambulatory Visit: Payer: Self-pay | Admitting: Physician Assistant

## 2020-02-24 NOTE — Telephone Encounter (Signed)
Pt's pharmacy is requesting a refill on sildenafil. Would Dr. Cooper like to refill this medication? Please address 

## 2020-03-07 ENCOUNTER — Ambulatory Visit (INDEPENDENT_AMBULATORY_CARE_PROVIDER_SITE_OTHER): Payer: Medicare Other | Admitting: Family Medicine

## 2020-03-07 ENCOUNTER — Encounter: Payer: Self-pay | Admitting: Family Medicine

## 2020-03-07 ENCOUNTER — Other Ambulatory Visit: Payer: Self-pay

## 2020-03-07 VITALS — BP 130/78 | HR 74 | Ht 71.0 in | Wt 249.0 lb

## 2020-03-07 DIAGNOSIS — I10 Essential (primary) hypertension: Secondary | ICD-10-CM | POA: Diagnosis not present

## 2020-03-07 DIAGNOSIS — S91331A Puncture wound without foreign body, right foot, initial encounter: Secondary | ICD-10-CM | POA: Insufficient documentation

## 2020-03-07 DIAGNOSIS — F1010 Alcohol abuse, uncomplicated: Secondary | ICD-10-CM | POA: Diagnosis not present

## 2020-03-07 DIAGNOSIS — E785 Hyperlipidemia, unspecified: Secondary | ICD-10-CM | POA: Diagnosis not present

## 2020-03-07 HISTORY — DX: Puncture wound without foreign body, right foot, initial encounter: S91.331A

## 2020-03-07 NOTE — Assessment & Plan Note (Signed)
Last direct LDL was 57 on 10/14/19. Currently on Rosuvastatin 20mg  daily. - Continue rosuvastatin - Recheck lipid panel in next 3-6 months.

## 2020-03-07 NOTE — Assessment & Plan Note (Signed)
BP today 130/78 in office. Home readings show fluctuations between 80s-150s/60s-90s, the majority of which are at the goal of <140/90. Current medications include losartan 100mg  qhs, coreg 12.5mg  BID, HCTZ 12.5mg  daily - Continue current regimen - Continue to monitor BP at home - If symptoms of hypotension, patient advised to contact our office.

## 2020-03-07 NOTE — Patient Instructions (Addendum)
It was so great seeing you today! Today we discussed the following:  Hypertension: Your blood pressures seem to be doing a lot better, even though they are fluctuating still, they are improved from the prior visits that we have had.  Today her blood pressure was 130/78, which is at your goal of under 140/90.  Continue medications as prescribed.  Alcohol use: Still recommend trying to decrease your drinking down to moderation with only 1 drink per night, or 7 total in a week.  Foot wound: Your wound seems to be healing pretty well, keep an eye on it.  I recommend that your son check on your feet every couple of days and take pictures to ensure its not getting worse.  We can consider referral to podiatry if it gets worse or if it is not healing appropriately.  Cholesterol: At your next follow-up appointment in the next several months we will go ahead and recheck your cholesterol.  Your last several checks have been really good so I do not anticipate any problems.  Please continue your home statin

## 2020-03-07 NOTE — Assessment & Plan Note (Signed)
Patient has decreased alcohol intake, but it is still elevated above the suggested moderation of 7 max drinks per week - Counseled on goal of moderation

## 2020-03-07 NOTE — Progress Notes (Signed)
SUBJECTIVE:   CHIEF COMPLAINT / HPI:   Recent dental procedures Patient currently has swelling on the left and right lower jaw due to recent dental procedures including 2 root canals done yesterday on 12/27.  Patient is medicating with Tylenol and states that it is very effective for him.  He has no complaints at this time.  Right plantar wound Patient reports that a few months ago he was walking barefoot and may have stepped on something that caused a wound in his right lateral plantar foot.  At the time, it was very painful, but states that the pain has decreased significantly since then.  He still has soreness when there is pressure on the foot but is improved.  He denies fevers, chills, spreading redness, difficulty with ambulation.  Patient does have significant neuropathy of bilateral feet.  Patient is not interested in podiatry referral at this moment, he states that if it worsens or does not improve he would be open to referral, but it is currently healing.  HTN: BP in office is 130/78, home readings range from 80s-150s/60s-90s, though there is still decent monitor fluctuations the majority of the blood pressures are closer to goal than prior visits.  Current medications include HCTZ 12.5mg  daily, Coreg 12.5 mg BID, losartan 100 mg qhs.  He states he previously was having some episodes of dizziness, but attributes it more to the pain from his recent dental procedures done to his blood pressure.  HDL: Currently on rosuvastatin 20 mg daily, direct LDL on 10/14/19 was 57.  Alcohol use: Patient reports he is still drinking about 2-3 bourbon on the rocks per day.  He has previously quit in the past and notes that he has the ability to do it, but he states that being alone at home and at night it is difficult to not drink some days.  He does not have to drink every single night and denies a need for an eye-opener drink in the morning.  Ankylosing spondylitis Patient is being followed by  rheumatology. On 11/17/2019, patient was placed on Enbrel.  Patient does not feel that the medication has made much change, but is following up with rheumatology in January.  Advanced directive/Living will Patient reports that he has a power of attorney and a living will, he states he will uploaded through his MyChart.   PERTINENT  PMH / PSH: ankylosing spondylitis, OA, chronic SI joint pain, IgA deficiency, CAD s/p CABG x4  OBJECTIVE:   BP 130/78   Pulse 74   Ht 5\' 11"  (1.803 m)   Wt 249 lb (112.9 kg)   SpO2 98%   BMI 34.73 kg/m   Gen: NAD, sitting in chair in room, pleasant and conversant ENT: significant swelling of right lower and left lower jaw area due to recent dental procedures Skin: plantar lateral right foot wound as pictured below, no fluctuance noted, minimal erythema, no warmth    ASSESSMENT/PLAN:   Puncture wound of right foot Picture in chart of wound. Physical exam findings not suggestive of infection. Pt does have significant neuropathy of bilateral feet. - Continue to watch, if worsening symptoms (pain, redness, swelling), patient to return to care - Recommend son's assistance with 2-3 weekly foot checks with pictures until resolution - Consider podiatry referral if not continuing to heal or if worsening.  Hyperlipidemia Last direct LDL was 57 on 10/14/19. Currently on Rosuvastatin 20mg  daily. - Continue rosuvastatin - Recheck lipid panel in next 3-6 months.  Alcohol use disorder, mild,  abuse Patient has decreased alcohol intake, but it is still elevated above the suggested moderation of 7 max drinks per week - Counseled on goal of moderation  HTN, goal below 140/90 BP today 130/78 in office. Home readings show fluctuations between 80s-150s/60s-90s, the majority of which are at the goal of <140/90. Current medications include losartan 100mg  qhs, coreg 12.5mg  BID, HCTZ 12.5mg  daily - Continue current regimen - Continue to monitor BP at home - If symptoms of  hypotension, patient advised to contact our office.     Follow-up in next 3-6 months unless acute need.  , DO [facility]

## 2020-03-07 NOTE — Assessment & Plan Note (Signed)
Picture in chart of wound. Physical exam findings not suggestive of infection. Pt does have significant neuropathy of bilateral feet. - Continue to watch, if worsening symptoms (pain, redness, swelling), patient to return to care - Recommend son's assistance with 2-3 weekly foot checks with pictures until resolution - Consider podiatry referral if not continuing to heal or if worsening.

## 2020-03-10 ENCOUNTER — Encounter: Payer: Self-pay | Admitting: Family Medicine

## 2020-03-15 ENCOUNTER — Encounter: Payer: Self-pay | Admitting: Physician Assistant

## 2020-03-15 ENCOUNTER — Other Ambulatory Visit: Payer: Self-pay

## 2020-03-15 ENCOUNTER — Ambulatory Visit: Payer: Medicare Other | Admitting: Physician Assistant

## 2020-03-15 VITALS — BP 148/78 | HR 75 | Ht 73.0 in | Wt 250.2 lb

## 2020-03-15 DIAGNOSIS — E785 Hyperlipidemia, unspecified: Secondary | ICD-10-CM

## 2020-03-15 DIAGNOSIS — I10 Essential (primary) hypertension: Secondary | ICD-10-CM | POA: Diagnosis not present

## 2020-03-15 DIAGNOSIS — I2510 Atherosclerotic heart disease of native coronary artery without angina pectoris: Secondary | ICD-10-CM | POA: Diagnosis not present

## 2020-03-15 NOTE — Progress Notes (Signed)
Cardiology Office Note:    Date:  03/15/2020   ID:  Gregory Hansen, DOB 08-22-1950, MRN [id number]  PCP:  Rise Patience, DO  [facility] Cardiologist:  [name], MD  [place] Electrophysiologist:  None   Referring MD: Rise Patience, DO   Chief Complaint:  Follow-up (CAD)    Patient Profile:    Gregory Hansen is a 70 y.o. male with:   Coronary artery disease  ? S/p multiple PCI procedures ? S/p CABG in 2015  Hypertension   Hyperlipidemia   GERD   Anxiety   DJD  Neuropathy  S/p fall in 01/2019 >> L1 burst fx, c/b rhabdo, AKI, elevated LFTs  Ankylosing spondylitis    Prior CV studies: Pre-CABG Dopplers 10/26/2013 Bilateral ICA 1-39  Cardiac catheterization 10/25/13 Coronary angiography: Coronary dominance:right  Left mainstem:The left mainstem is heavily calcified. The vessel has 90-95% proximal stenosis, new from the previous study. There is irregularity to the plaque with evidence of ulceration.  Left anterior descending (LAD):The LAD is diffusely diseased. The proximal vessel at the first diagonal branch has 75% stenosis. The mid vessel at the origin of the second diagonal branch as 50-60% stenosis with diffuse disease involving the entire distal vessel.  Left circumflex (LCx):The left circumflex is patent. The mid circumflex was stented with no significant in-stent restenosis. The OM branches are patent.  Right coronary artery (RCA):The right coronary cusp is severely calcified. The RCA is heavily stented and calcified throughout its course. The stented segment involving the ostium and proximal portion of the RCA is severely restenosed with diffuse 80-90% in-stent restenosis. The remaining portions of the RCA are patent with moderate mid vessel stenosis of 40-50%. The distal vessel has diffuse irregularity. The PDA and PLA branches are patent with mild diffuse irregularity.  Left ventriculography: Left ventricular systolic function is  normal, LVEF is estimated at 55-65%, there is no significant mitral regurgitation   Contrast: 70 cc Omnipaque  Radiation dose/Fluoro time: 3.6 minutes  Estimated Blood Loss: minimal  Final Conclusions:  1. Severe left main disease 2. Severe proximal RCA stenosis (in-stent) 3. Severe proximal LAD stenosis 4. Patent LCx 5. Normal LV systolic function  Myoview 10/19/2013 High risk, fully reversible anterior and apical septal/apical lateral perfusion defect, EF 52   History of Present Illness:    Gregory Hansen was last seen in 1/21.  He returns for f/u.   He is here alone. Overall, he has been doing well. He has not had chest discomfort, significant shortness of breath, orthopnea, or syncope. He does get dizzy at times. He has been working with primary care for to control his blood pressure. It sounds like amlodipine was stopped due to leg edema. He was given HCTZ to take as needed and his leg edema has resolved. He is limited by his ankylosing spondylitis. He just got a stand-up walker and is hopeful that this will help him increase activity.  Past Medical History:  Diagnosis Date  . Acute hepatitis 01/26/2019  . Acute kidney failure ([place]) 03/30/2018  . AKI (acute kidney injury) ([place])   . Anginal pain ([place])   . Anxiety   . Arthritis    SPINE   . Arthritis   . Cellulitis 03/30/2018  . Coronary artery disease    s/p multiple percutaneous interventions, PCI 202 for DMI and DES distal RCA and CFX-OM 2006  . Coronary artery disease   . Depression   . Depression, major, single episode, mild ([place]) 06/15/2008   Qualifier: Diagnosis  of  By: Wendee Copp    . GERD (gastroesophageal reflux disease)   . Hyperlipidemia    mixed  . Hyperlipidemia   . Hypertension   . Major depressive disorder, single episode 06/15/2008   Overview:  Overview:  Qualifier: Diagnosis of  By: Wendee Copp   . MI (myocardial infarction) (HCC)   . Myocardial infarction [facility])    ? 2006  . Peripheral  neuropathy    calves and both feet  . Peripheral neuropathy    calves and both feet  . Rhabdomyolysis 02/02/2019  . Severe sepsis with acute organ dysfunction (HCC) 12/03/2016  . Spondylitis, ankylosing (HCC)     Current Medications: Current Meds  Medication Sig  . aspirin 81 MG tablet Take 81 mg by mouth every evening.   Marland Kitchen buPROPion (WELLBUTRIN XL) 300 MG 24 hr tablet Take 300 mg by mouth daily.  . carvedilol (COREG) 12.5 MG tablet Take 1 tablet (12.5 mg total) by mouth 2 (two) times daily with a meal.  . docusate sodium (COLACE) 100 MG capsule Take 100 mg by mouth daily as needed for mild constipation.  . Etanercept (ENBREL MINI) 50 MG/ML SOCT Inject 50 mg into the skin once a week.  . hydrochlorothiazide (MICROZIDE) 12.5 MG capsule Take 12.5 mg by mouth as needed.  Marland Kitchen ipratropium (ATROVENT) 0.06 % nasal spray 2 sprays each nostril 1-2 times a day as needed  . levocetirizine (XYZAL) 5 MG tablet Take 5 mg by mouth every evening.  Marland Kitchen losartan (COZAAR) 100 MG tablet Take 1 tablet (100 mg total) by mouth at bedtime.  . nitroGLYCERIN (NITROSTAT) 0.4 MG SL tablet Place 0.4 mg under the tongue every 5 (five) minutes as needed for chest pain.  . pantoprazole (PROTONIX) 40 MG tablet TAKE 1 TABLET BY MOUTH  DAILY  . rosuvastatin (CRESTOR) 20 MG tablet Take 1 tablet (20 mg total) by mouth daily.  . sildenafil (REVATIO) 20 MG tablet TAKE 2-5 TABLETS BY MOUTH AS NEEDED PRIOR TO SEXUAL ACTIVITY  . Vilazodone HCl (VIIBRYD PO) Take 20 mg by mouth daily.   . vitamin B-12 (CYANOCOBALAMIN) 1000 MCG tablet Take 1,000 mcg by mouth daily.   Current Facility-Administered Medications for the 03/15/20 encounter (Office Visit) with Tereso Newcomer T, PA-C  Medication  . 0.9 %  sodium chloride infusion     Allergies:   Patient has no known allergies.   Social History   Tobacco Use  . Smoking status: Former Smoker    Packs/day: 2.00    Years: 10.00    Pack years: 20.00    Types: Cigarettes    Quit date:  06/30/1979    Years since quitting: 40.7  . Smokeless tobacco: Never Used  . Tobacco comment: no plans to start back  Vaping Use  . Vaping Use: Never used  Substance Use Topics  . Alcohol use: Yes    Alcohol/week: 16.0 standard drinks    Types: 14 Glasses of wine, 2 Shots of liquor per week    Comment: 3-4 glasses of wine at dinner everyday.  . Drug use: No     Family Hx: The patient's family history includes Breast cancer (age of onset: 62) in his mother; Heart attack in his brother; Heart disease in his brother, father, maternal grandfather, and another family member; Leukemia in his maternal grandmother. There is no history of Allergic rhinitis, Angioedema, Asthma, Atopy, Immunodeficiency, Eczema, Urticaria, Colon cancer, Colon polyps, Esophageal cancer, Rectal cancer, or Stomach cancer.  ROS   EKGs/Labs/Other  Test Reviewed:    EKG:  EKG is   ordered today.  The ekg ordered today demonstrates normal sinus rhythm, heart rate 75, normal axis, incomplete right bundle branch block, QTC 446, no ST-T wave changes, no change from prior tracing  Recent Labs: 10/14/2019: TSH 5.960 12/27/2019: ALT 45; BUN 17; Creat 0.86; Hemoglobin 11.9; Platelets 149; Potassium 4.3; Sodium 137   Recent Lipid Panel Lab Results  Component Value Date/Time   CHOL 149 02/25/2019 04:46 PM   TRIG 90 02/25/2019 04:46 PM   HDL 31 (L) 02/25/2019 04:46 PM   CHOLHDL 4.8 02/25/2019 04:46 PM   CHOLHDL 2.8 05/25/2015 09:37 AM   LDLCALC 101 (H) 02/25/2019 04:46 PM   LDLDIRECT 57 10/14/2019 12:21 PM   LDLDIRECT 89.7 08/01/2011 09:21 AM      Risk Assessment/Calculations:      Physical Exam:    VS:  BP (!) 148/78   Pulse 75   Ht 6\' 1"  (1.854 m)   Wt 250 lb 3.2 oz (113.5 kg)   SpO2 97%   BMI 33.01 kg/m     Wt Readings from Last 3 Encounters:  03/15/20 250 lb 3.2 oz (113.5 kg)  03/07/20 249 lb (112.9 kg)  02/07/20 245 lb (111.1 kg)     Constitutional:      Appearance: Healthy appearance. Not in  distress.  Neck:     Vascular: No JVR. JVD normal.  Pulmonary:     Effort: Pulmonary effort is normal.     Breath sounds: No wheezing. No rales.  Cardiovascular:     Normal rate. Regular rhythm. Normal S1. Normal S2.     Murmurs: There is no murmur.  Edema:    Peripheral edema absent.  Abdominal:     Palpations: Abdomen is soft. There is no hepatomegaly.  Skin:    General: Skin is warm and dry.  Neurological:     General: No focal deficit present.     Mental Status: Alert and oriented to person, place and time.     Cranial Nerves: Cranial nerves are intact.       ASSESSMENT & PLAN:    1. Coronary artery disease involving native coronary artery of native heart without angina pectoris Hx of multiple PCI procedures and eventual CABG in 2015. He is doing well without anginal symptoms. His electrocardiogram is unchanged. Continue current dose of aspirin, carvedilol, rosuvastatin.  2. Essential hypertension Blood pressure is uncontrolled. He has been working closely with primary care. He has had some medication adjustments recently. He has follow-up at some point in the next few months. We discussed the importance of limiting salt as well as increasing activity and weight loss. Spironolactone could be added to his medical regimen if his pressure remains uncontrolled.  3. Hyperlipidemia with target LDL less than 70 LDL optimal on most recent lab work.  Continue current dose of Rosuvastatin.       Dispo:  Return in about 1 year (around 03/15/2021) for Routine Follow Up, w/ Dr. 05/13/2021, or Excell Seltzer, PA-C.   Medication Adjustments/Labs and Tests Ordered: Current medicines are reviewed at length with the patient today.  Concerns regarding medicines are outlined above.  Tests Ordered: Orders Placed This Encounter  Procedures  . EKG 12-Lead   Medication Changes: No orders of the defined types were placed in this encounter.   Signed, Tereso Newcomer, PA-C  03/15/2020 3:48 PM    [facility] [address] [place], [place], [place]  [place] Phone: [phone];  Fax: [phone]

## 2020-03-15 NOTE — Patient Instructions (Signed)
Medication Instructions:  °Your physician recommends that you continue on your current medications as directed. Please refer to the Current Medication list given to you today. ° °*If you need a refill on your cardiac medications before your next appointment, please call your pharmacy* ° °Lab Work: °None ordered today ° °Testing/Procedures: °None ordered today ° °Follow-Up: °At [facility], you and your health needs are our priority.  As part of our continuing mission to provide you with exceptional heart care, we have created designated Provider Care Teams.  These Care Teams include your primary Cardiologist (physician) and Advanced Practice Providers (APPs -  Physician Assistants and Nurse Practitioners) who all work together to provide you with the care you need, when you need it. ° °Your next appointment:   °12 month(s) ° °The format for your next appointment:   °In Person ° °Provider:   °You may see Michael Cooper, MD or Scott Weaver, PA-C °

## 2020-03-28 NOTE — Telephone Encounter (Signed)
Received a fax from  [employer] regarding an approval for ENBREL patient assistance from 03/27/20 to 03/10/21.   Phone number: [phone]  Knox Saliva, PharmD, MPH Clinical Pharmacist (Rheumatology and Pulmonology)

## 2020-03-30 ENCOUNTER — Telehealth: Payer: Self-pay

## 2020-03-30 ENCOUNTER — Other Ambulatory Visit: Payer: Self-pay | Admitting: Physician Assistant

## 2020-03-30 NOTE — Telephone Encounter (Signed)
Ok to refill for 1 year. Richardson Dopp, PA-C    03/30/2020 1:43 PM

## 2020-03-30 NOTE — Telephone Encounter (Signed)
Pt pharmacy is requesting Sildenafil. Do you want to refill?  Please advise. Thanks

## 2020-03-30 NOTE — Telephone Encounter (Signed)
Hi Gregory Hansen, pt is requestin

## 2020-03-30 NOTE — Telephone Encounter (Signed)
RX sent to pharmacy  

## 2020-04-18 NOTE — Progress Notes (Addendum)
Office Visit Note  Patient: Gregory Hansen             Date of Birth: [birth year]           MRN: [id number]             PCP: Rise Patience, DO Referring: Rise Patience, DO Visit Date: 05/02/2020 Occupation: @GUAROCC @  Subjective:  Arthritis (Not doing good, low back pain is worse)   History of Present Illness: Gregory Hansen is a 70 y.o. male with history of ankylosing spondylitis, degenerative disc disease and osteoarthritis.  He has been on Enbrel since September 2021.  He states he does not have so much pain in the SI joints but his lower back has been causing increased pain and discomfort.  He states he has been taking ibuprofen 800 mg at bedtime.  He states the pain is worse when he is standing for prolonged time.  Sitting relieves his pain.  Although he denies any radiculopathy to his lower extremities.  He has not noticed any side effects from Enbrel.  He denies any history of Achilles tendinitis or plantar fasciitis.  He had physical therapy in the past which was very helpful.  Activities of Daily Living:  Patient reports morning stiffness for 0 none.   Patient Denies nocturnal pain.  Difficulty dressing/grooming: Denies Difficulty climbing stairs: Denies Difficulty getting out of chair: Denies Difficulty using hands for taps, buttons, cutlery, and/or writing: Denies  Review of Systems  Constitutional: Positive for fatigue. Negative for night sweats.  HENT: Positive for mouth dryness. Negative for mouth sores and nose dryness.   Eyes: Negative for discharge, redness and dryness.  Respiratory: Negative for shortness of breath and difficulty breathing.   Cardiovascular: Positive for swelling in legs/feet. Negative for chest pain, palpitations, hypertension and irregular heartbeat.  Gastrointestinal: Positive for constipation. Negative for diarrhea.  Endocrine: Positive for excessive thirst and increased urination.  Genitourinary: Negative for difficulty urinating.   Musculoskeletal: Positive for arthralgias and joint pain. Negative for gait problem, joint swelling, myalgias, muscle weakness, morning stiffness, muscle tenderness and myalgias.  Skin: Positive for rash. Negative for color change, hair loss, nodules/bumps, skin tightness, ulcers and sensitivity to sunlight.  Allergic/Immunologic: Negative for susceptible to infections.  Neurological: Positive for numbness. Negative for dizziness, fainting, memory loss, night sweats and weakness ( ).  Hematological: Negative for bruising/bleeding tendency and swollen glands.  Psychiatric/Behavioral: Negative for depressed mood and sleep disturbance. The patient is not nervous/anxious.     PMFS History:  Patient Active Problem List   Diagnosis Date Noted  . Puncture wound of right foot 03/07/2020  . Alcohol use disorder, mild, abuse 10/14/2019  . Chronic bilateral low back pain without sciatica 07/13/2019  . Numbness and tingling in right hand 07/13/2019  . Fall at home, initial encounter 02/02/2019  . Leg cramping 12/22/2018  . Rash 05/23/2018  . Peripheral neuropathy 11/04/2017  . Gynecomastia, male 02/18/2017  . Healthcare maintenance 01/20/2017  . Aortic atherosclerosis ([place]) 12/03/2016  . History of cryptosporidiosis   . S/P CABG x 4 10/29/2013  . Hyperlipidemia 06/15/2008  . HTN, goal below 140/90 06/15/2008  . CAD (coronary artery disease) 06/15/2008  . SPONDYLITIS, ANKYLOSING 06/15/2008  . Atherosclerotic heart disease of native coronary artery without angina pectoris 06/15/2008  . Gastro-esophageal reflux disease without esophagitis 06/15/2008    Past Medical History:  Diagnosis Date  . Acute hepatitis 01/26/2019  . Acute kidney failure ([place]) 03/30/2018  . AKI (acute kidney injury) ([place])   .  Anginal pain ([place])   . Anxiety   . Arthritis    SPINE   . Arthritis   . Cellulitis 03/30/2018  . Coronary artery disease    s/p multiple percutaneous interventions, PCI 202 for DMI and DES  distal RCA and CFX-OM 2006  . Coronary artery disease   . Depression   . Depression, major, single episode, mild ([place]) 06/15/2008   Qualifier: Diagnosis of  By: Orville Govern CMA, Carol    . GERD (gastroesophageal reflux disease)   . Hyperlipidemia    mixed  . Hyperlipidemia   . Hypertension   . Major depressive disorder, single episode 06/15/2008   Overview:  Overview:  Qualifier: Diagnosis of  By: Ronne Binning   . MI (myocardial infarction) ([place])   . Myocardial infarction [facility])    ? 2006  . Peripheral neuropathy    calves and both feet  . Peripheral neuropathy    calves and both feet  . Rhabdomyolysis 02/02/2019  . Severe sepsis with acute organ dysfunction ([place]) 12/03/2016  . Spondylitis, ankylosing ([facility])     Family History  Problem Relation Age of Onset  . Breast cancer Mother 59  . Heart disease Father   . Heart disease Brother   . Leukemia Maternal Grandmother   . Heart disease Maternal Grandfather   . Heart disease Other        positive for cardiac disease and both brothers have had cardiac events  . Heart attack Brother   . Allergic rhinitis Neg Hx   . Angioedema Neg Hx   . Asthma Neg Hx   . Atopy Neg Hx   . Immunodeficiency Neg Hx   . Eczema Neg Hx   . Urticaria Neg Hx   . Colon cancer Neg Hx   . Colon polyps Neg Hx   . Esophageal cancer Neg Hx   . Rectal cancer Neg Hx   . Stomach cancer Neg Hx    Past Surgical History:  Procedure Laterality Date  . ACHILLES TENDON REPAIR    . CARDIAC CATHETERIZATION     stent RCA June3, 2002, Stent OM/PTCA circumflex August 13, 2000  . CORONARY ANGIOPLASTY WITH STENT PLACEMENT     first one in 2006; had another place 3 months ago (August 2013)  . CORONARY ANGIOPLASTY WITH STENT PLACEMENT  03/26/2013   RCA           DR COOPER  . CORONARY ANGIOPLASTY WITH STENT PLACEMENT    . CORONARY ARTERY BYPASS GRAFT N/A 10/29/2013   Procedure: CORONARY ARTERY BYPASS GRAFTING x 4 (LIMA-LAD, SVG-OM, SVG-PD-PL) ENDOSCOPIC VEIN HARVEST RIGHT  THIGH;  Surgeon: Gaye Pollack, MD;  Location: [place] OR;  Service: Open Heart Surgery;  Laterality: N/A;  . CORONARY ARTERY BYPASS GRAFT    . FINGER SURGERY     5  TH     DIGIT RIGHT HAND  . FINGER SURGERY    . INTRAOPERATIVE TRANSESOPHAGEAL ECHOCARDIOGRAM N/A 10/29/2013   Procedure: INTRAOPERATIVE TRANSESOPHAGEAL ECHOCARDIOGRAM;  Surgeon: Gaye Pollack, MD;  Location[facility];  Service: Open Heart Surgery;  Laterality: N/A;  . KNEE ARTHROSCOPY W/ LASER  2003  . LEFT HEART CATHETERIZATION WITH CORONARY ANGIOGRAM N/A 10/01/2011   Procedure: LEFT HEART CATHETERIZATION WITH CORONARY ANGIOGRAM;  Surgeon: [name], MD;  Location: [facility] CATH LAB;  Service: Cardiovascular;  Laterality: N/A;  . LEFT HEART CATHETERIZATION WITH CORONARY ANGIOGRAM N/A 03/26/2013   Procedure: LEFT HEART CATHETERIZATION WITH CORONARY ANGIOGRAM;  Surgeon: Blane Ohara, MD;  Location:  [place] CATH LAB;  Service: Cardiovascular;  Laterality: N/A;  . LEFT HEART CATHETERIZATION WITH CORONARY ANGIOGRAM N/A 10/25/2013   Procedure: LEFT HEART CATHETERIZATION WITH CORONARY ANGIOGRAM;  Surgeon: Blane Ohara, MD;  Location: [facility] CATH LAB;  Service: Cardiovascular;  Laterality: N/A;  . TONSILLECTOMY     Social History   Social History Narrative   ** Merged History Encounter **       Lives in a one level home with son, and dog, Scientist, physiological. Works out of the house. 2 steps into house. Smoke alarms in house. No grab bars in bathroom. Has some area rugs and throw rugs with backing.      No pets of his own. Restores antique pens, does like to walk and cycle but having problems with peripheral neuropathy.       Firearm is safely stored.      Wears seat belts in vehicle. Does use sunblock and tries to avoid sun due to vitiligo and previous sunburns.      Eats varied diet. Likes protein, fresh fruits and veggies. Watches fats, saturated fats. Drink a lot of water.    Immunization History  Administered Date(s) Administered  . Influenza, High  Dose Seasonal PF 11/23/2016  . Influenza,inj,Quad PF,6+ Mos 12/18/2017, 11/20/2018  . Influenza-Unspecified 12/02/2019  . PFIZER(Purple Top)SARS-COV-2 Vaccination 05/03/2019, 05/24/2019, 12/07/2019  . Pneumococcal Conjugate-13 01/20/2017  . Pneumococcal Polysaccharide-23 07/01/2015, 04/01/2018  . Tdap 06/30/2015  . Zoster Recombinat (Shingrix) 06/28/2019, 09/13/2019     Objective: Vital Signs: BP (!) 152/68 (BP Location: Left Arm, Patient Position: Sitting, Cuff Size: Normal)   Pulse 98   Resp 16   Ht 6\' 2"  (1.88 m)   Wt 261 lb (118.4 kg)   BMI 33.51 kg/m    Physical Exam Vitals and nursing note reviewed.  Constitutional:      Appearance: He is well-developed and well-nourished.  HENT:     Head: Normocephalic and atraumatic.  Eyes:     Extraocular Movements: EOM normal.     Conjunctiva/sclera: Conjunctivae normal.     Pupils: Pupils are equal, round, and reactive to light.  Cardiovascular:     Rate and Rhythm: Normal rate and regular rhythm.     Heart sounds: Normal heart sounds.  Pulmonary:     Effort: Pulmonary effort is normal.     Breath sounds: Normal breath sounds.  Abdominal:     General: Bowel sounds are normal.     Palpations: Abdomen is soft.  Musculoskeletal:     Cervical back: Normal range of motion and neck supple.     Right lower leg: Edema present.     Left lower leg: Edema present.  Skin:    General: Skin is warm and dry.     Capillary Refill: Capillary refill takes less than 2 seconds.  Neurological:     Mental Status: He is alert and oriented to person, place, and time.  Psychiatric:        Mood and Affect: Mood and affect normal.        Behavior: Behavior normal.      Musculoskeletal Exam: He has limited range of motion of cervical thoracic and lumbar spine.  He had no tenderness over SI joints today.  Shoulder joints, elbow joints, wrist joints were in good range of motion.  He has no synovitis over MCPs PIPs or DIPs.  Hip joints and knee  joints with good range of motion.  No warmth was noted on palpation of his knee joints.  There  was no swelling over ankle joints.  Although he did have bilateral pedal edema.  CDAI Exam: CDAI Score: -- Patient Global: --; Provider Global: -- Swollen: --; Tender: -- Joint Exam 05/02/2020   No joint exam has been documented for this visit   There is currently no information documented on the homunculus. Go to the Rheumatology activity and complete the homunculus joint exam.  Investigation: No additional findings.  Imaging: No results found.  Recent Labs: Lab Results  Component Value Date   WBC 5.1 05/02/2020   HGB 10.9 (L) 05/02/2020   PLT 150 05/02/2020   NA 133 (L) 05/02/2020   K 4.4 05/02/2020   CL 101 05/02/2020   CO2 26 05/02/2020   GLUCOSE 107 (H) 05/02/2020   BUN 17 05/02/2020   CREATININE 0.99 05/02/2020   BILITOT 0.7 05/02/2020   ALKPHOS 141 (H) 02/25/2019   AST 32 05/02/2020   ALT 25 05/02/2020   PROT 8.2 (H) 05/02/2020   ALBUMIN 3.4 (L) 02/25/2019   CALCIUM 8.9 05/02/2020   GFRAA 89 05/02/2020   QFTBGOLDPLUS NEGATIVE 11/10/2019    Speciality Comments: Enbrel started December 09, 2019  Procedures:  No procedures performed Allergies: Patient has no known allergies.   Assessment / Plan:     Visit Diagnoses: Ankylosing spondylitis, unspecified site of spine ([place]) -he has been experiencing lower back pain which she describes in the lumbar region.  The SI joint pain has resolved.  I do not see inflammation in his left knee and right ankle which was noted at the last visit.  I believe Enbrel has been showing its benefit.  Plan: Sedimentation rate  Chronic SI joint pain-improved.  History of iritis - No recurrence in the last 20 years  High risk medication use - Enbrel 50 mg subcu weekly started December 09, 2019.  - Plan: CBC with Differential/Platelet, COMPLETE METABOLIC PANEL WITH GFR today and then every 3 months to monitor for drug toxicity.  Side  effects of Enbrel were again discussed.  He is aware to stop Enbrel in case he develops any infection and restart.  He is fully vaccinated against COVID-19.  Use of mask, social distancing and hand hygiene was discussed.  Elevated LFTs-he was advised to avoid all NSAIDs and alcohol.  He states that he has been taking ibuprofen for lower back pain.  DDD (degenerative disc disease), thoracic-chronic discomfort.  Arthropathy of lumbar facet joint-he has been experiencing increased pain and discomfort in his lower back.  He has difficulty walking and standing for the prolonged time.  He denies any radiculopathy.  I will refer him to physical therapy.  Primary osteoarthritis of both knees - Bilateral severe osteoarthritis and severe chondromalacia patella.  He continues to have some chronic knee joint pain.  Right ankle swelling-resolved.  IgA deficiency, selective ([place]) - he was evaluated by ID and immunology.  No interventions are needed.  History of vertebral fracture - CT lumbar 01/26/19: acute predominantly horizontally oriented fracture of L1 vertebral body.  Due to injury  Other medical problems are listed as follows:  Vitiligo  Coronary artery disease involving native coronary artery of native heart without angina pectoris  S/P CABG x 4  Peripheral polyneuropathy  Essential hypertension-his systolic blood pressure is elevated.  Have advised him to monitor blood pressure closely.  Gastro-esophageal reflux disease without esophagitis  History of depression  History of cryptosporidiosis  History of alcohol use disorder  BMI 33.51-weight loss diet and exercise was discussed.  I have advised him to  contact weight management clinic.  Information was given.  Orders: Orders Placed This Encounter  Procedures  . CBC with Differential/Platelet  . COMPLETE METABOLIC PANEL WITH GFR  . Sedimentation rate  . Ambulatory referral to Physical Therapy   No orders of the defined types  were placed in this encounter.    Follow-Up Instructions: Return in about 3 months (around 07/30/2020) for Ankylosing spondylitis.   Bo Merino, MD  Note - This record has been created using Editor, commissioning.  Chart creation errors have been sought, but may not always  have been located. Such creation errors do not reflect on  the standard of medical care.

## 2020-05-02 ENCOUNTER — Ambulatory Visit: Payer: Medicare Other | Admitting: Rheumatology

## 2020-05-02 ENCOUNTER — Encounter: Payer: Self-pay | Admitting: Rheumatology

## 2020-05-02 ENCOUNTER — Other Ambulatory Visit: Payer: Self-pay

## 2020-05-02 VITALS — BP 152/68 | HR 98 | Resp 16 | Ht 74.0 in | Wt 261.0 lb

## 2020-05-02 DIAGNOSIS — Z8669 Personal history of other diseases of the nervous system and sense organs: Secondary | ICD-10-CM

## 2020-05-02 DIAGNOSIS — G8929 Other chronic pain: Secondary | ICD-10-CM

## 2020-05-02 DIAGNOSIS — Z87898 Personal history of other specified conditions: Secondary | ICD-10-CM

## 2020-05-02 DIAGNOSIS — M459 Ankylosing spondylitis of unspecified sites in spine: Secondary | ICD-10-CM | POA: Diagnosis not present

## 2020-05-02 DIAGNOSIS — M25471 Effusion, right ankle: Secondary | ICD-10-CM | POA: Diagnosis not present

## 2020-05-02 DIAGNOSIS — M170 Bilateral primary osteoarthritis of knee: Secondary | ICD-10-CM

## 2020-05-02 DIAGNOSIS — Z8781 Personal history of (healed) traumatic fracture: Secondary | ICD-10-CM | POA: Diagnosis not present

## 2020-05-02 DIAGNOSIS — G629 Polyneuropathy, unspecified: Secondary | ICD-10-CM

## 2020-05-02 DIAGNOSIS — I10 Essential (primary) hypertension: Secondary | ICD-10-CM

## 2020-05-02 DIAGNOSIS — Z8659 Personal history of other mental and behavioral disorders: Secondary | ICD-10-CM

## 2020-05-02 DIAGNOSIS — M47816 Spondylosis without myelopathy or radiculopathy, lumbar region: Secondary | ICD-10-CM

## 2020-05-02 DIAGNOSIS — D802 Selective deficiency of immunoglobulin A [IgA]: Secondary | ICD-10-CM

## 2020-05-02 DIAGNOSIS — M533 Sacrococcygeal disorders, not elsewhere classified: Secondary | ICD-10-CM | POA: Diagnosis not present

## 2020-05-02 DIAGNOSIS — Z951 Presence of aortocoronary bypass graft: Secondary | ICD-10-CM

## 2020-05-02 DIAGNOSIS — R7989 Other specified abnormal findings of blood chemistry: Secondary | ICD-10-CM

## 2020-05-02 DIAGNOSIS — L80 Vitiligo: Secondary | ICD-10-CM | POA: Diagnosis not present

## 2020-05-02 DIAGNOSIS — K219 Gastro-esophageal reflux disease without esophagitis: Secondary | ICD-10-CM

## 2020-05-02 DIAGNOSIS — I2510 Atherosclerotic heart disease of native coronary artery without angina pectoris: Secondary | ICD-10-CM

## 2020-05-02 DIAGNOSIS — Z79899 Other long term (current) drug therapy: Secondary | ICD-10-CM

## 2020-05-02 DIAGNOSIS — M5134 Other intervertebral disc degeneration, thoracic region: Secondary | ICD-10-CM

## 2020-05-02 DIAGNOSIS — Z6833 Body mass index (BMI) 33.0-33.9, adult: Secondary | ICD-10-CM

## 2020-05-02 DIAGNOSIS — Z8619 Personal history of other infectious and parasitic diseases: Secondary | ICD-10-CM

## 2020-05-02 LAB — CBC WITH DIFFERENTIAL/PLATELET
Absolute Monocytes: 734 cells/uL (ref 200–950)
Basophils Absolute: 51 cells/uL (ref 0–200)
Basophils Relative: 1 %
Eosinophils Absolute: 260 cells/uL (ref 15–500)
Eosinophils Relative: 5.1 %
HCT: 35.3 % — ABNORMAL LOW (ref 38.5–50.0)
Hemoglobin: 10.9 g/dL — ABNORMAL LOW (ref 13.2–17.1)
Lymphs Abs: 1025 cells/uL (ref 850–3900)
MCH: 25.8 pg — ABNORMAL LOW (ref 27.0–33.0)
MCHC: 30.9 g/dL — ABNORMAL LOW (ref 32.0–36.0)
MCV: 83.5 fL (ref 80.0–100.0)
MPV: 10.5 fL (ref 7.5–12.5)
Monocytes Relative: 14.4 %
Neutro Abs: 3029 cells/uL (ref 1500–7800)
Neutrophils Relative %: 59.4 %
Platelets: 150 10*3/uL (ref 140–400)
RBC: 4.23 10*6/uL (ref 4.20–5.80)
RDW: 16.6 % — ABNORMAL HIGH (ref 11.0–15.0)
Total Lymphocyte: 20.1 %
WBC: 5.1 10*3/uL (ref 3.8–10.8)

## 2020-05-02 LAB — COMPLETE METABOLIC PANEL WITH GFR
AG Ratio: 0.8 (calc) — ABNORMAL LOW (ref 1.0–2.5)
ALT: 25 U/L (ref 9–46)
AST: 32 U/L (ref 10–35)
Albumin: 3.7 g/dL (ref 3.6–5.1)
Alkaline phosphatase (APISO): 74 U/L (ref 35–144)
BUN: 17 mg/dL (ref 7–25)
CO2: 26 mmol/L (ref 20–32)
Calcium: 8.9 mg/dL (ref 8.6–10.3)
Chloride: 101 mmol/L (ref 98–110)
Creat: 0.99 mg/dL (ref 0.70–1.18)
GFR, Est African American: 89 mL/min/{1.73_m2} (ref >=60)
GFR, Est Non African American: 77 mL/min/{1.73_m2} (ref >=60)
Globulin: 4.5 g/dL (calc) — ABNORMAL HIGH (ref 1.9–3.7)
Glucose, Bld: 107 mg/dL — ABNORMAL HIGH (ref 65–99)
Potassium: 4.4 mmol/L (ref 3.5–5.3)
Sodium: 133 mmol/L — ABNORMAL LOW (ref 135–146)
Total Bilirubin: 0.7 mg/dL (ref 0.2–1.2)
Total Protein: 8.2 g/dL — ABNORMAL HIGH (ref 6.1–8.1)

## 2020-05-02 LAB — SEDIMENTATION RATE: Sed Rate: 41 mm/h — ABNORMAL HIGH (ref 0–20)

## 2020-05-02 NOTE — Addendum Note (Signed)
Addended by: Earnestine Mealing on: 05/02/2020 09:37 AM   Modules accepted: Orders

## 2020-05-02 NOTE — Patient Instructions (Addendum)
Standing Labs We placed an order today for your standing lab work.   Please have your standing labs drawn in May and every 3 months   If possible, please have your labs drawn 2 weeks prior to your appointment so that the provider can discuss your results at your appointment.  We have open lab daily Monday through Thursday from 1:30-4:30 PM and Friday from 1:30-4:00 PM at the office of Dr. Murrel Freet, [facility].   Please be advised, all patients with office appointments requiring lab work will take precedents over walk-in lab work.  If possible, please come for your lab work on Monday and Friday afternoons, as you may experience shorter wait times. The office is located at [address], [address], [place], [place] [address] No appointment is necessary.   Labs are drawn by [facility]. Please bring your co-pay at the time of your lab draw.  You may receive a bill from [facility] for your lab work.  If you wish to have your labs drawn at another location, please call the office 24 hours in advance to send orders.  If you have any questions regarding directions or hours of operation,  please call [phone].   As a reminder, please drink plenty of water prior to coming for your lab work. Thanks!   

## 2020-05-03 NOTE — Progress Notes (Signed)
Anemia noted.  CMP is normal, sed rate is elevated.  Elevated sed rate may indicate increased disease activity.  He was experiencing increased pain yesterday.  If he is a still having discomfort we can schedule appointment to change treatment or give a course of prednisone taper.  Please discussed with the patient.

## 2020-05-04 ENCOUNTER — Other Ambulatory Visit: Payer: Self-pay | Admitting: *Deleted

## 2020-05-04 MED ORDER — PREDNISONE 5 MG PO TABS: ORAL_TABLET | ORAL | 0 refills | Status: DC

## 2020-05-04 NOTE — Progress Notes (Signed)
I called patient and discussed the lab results.  We also discussed that we may have to switch his medication from Enbrel to some other option.  Different treatment options and their side effects were discussed.  He is in agreement to proceed with Humira.  We will apply for Humira.  Please send a message to [facility].  Once approved we can schedule an appointment in the office to discuss the medication and also do his first injection.  He is in a lot of discomfort and would like to have a prednisone taper.  He has taken prednisone in the past.  Side effects of prednisone were discussed at length.  Please send a prednisone taper starting at 20 mg and taper by 5 mg every 4 days.

## 2020-05-05 ENCOUNTER — Telehealth: Payer: Self-pay | Admitting: Pharmacist

## 2020-05-05 NOTE — Telephone Encounter (Addendum)
Submitted a Prior Authorization request to [facility] for HUMIRA 40mg  every 14 days via Cover My Meds. Will update once we receive a response.  Copay $1123.64 per 28 days. Gregory Hansen has mailed [employer] Assist application  Key: [id number]  Gregory Hansen, PharmD, MPH Clinical Pharmacist (Rheumatology and Pulmonology)

## 2020-05-05 NOTE — Telephone Encounter (Addendum)
Medicare pt- Will mail patient [employer] Assist application.  Left VM for patient to advise that application will be sent to address on file in 7-10 business days

## 2020-05-11 ENCOUNTER — Encounter: Payer: Self-pay | Admitting: Rheumatology

## 2020-05-15 NOTE — Telephone Encounter (Signed)
[place] document in previous encounter

## 2020-05-15 NOTE — Telephone Encounter (Addendum)
Received patient portion via Mychart. Will fax in application once we have MD sign provider portion.  Addendum: Faxed completed [employer] application for Humira - signed patient forms including income docs, signed provider portion, med list, insurance card copy, and prior auth approval letter  Fax: [phone] Phone: [phone]  Knox Saliva, PharmD, MPH Clinical Pharmacist (Rheumatology and Pulmonology)

## 2020-05-24 ENCOUNTER — Ambulatory Visit: Payer: Medicare Other | Attending: Rheumatology | Admitting: Physical Therapy

## 2020-05-24 ENCOUNTER — Other Ambulatory Visit: Payer: Self-pay

## 2020-05-24 ENCOUNTER — Encounter: Payer: Self-pay | Admitting: Physical Therapy

## 2020-05-24 ENCOUNTER — Encounter: Payer: Self-pay | Admitting: Rheumatology

## 2020-05-24 DIAGNOSIS — M545 Low back pain, unspecified: Secondary | ICD-10-CM | POA: Diagnosis not present

## 2020-05-24 DIAGNOSIS — M546 Pain in thoracic spine: Secondary | ICD-10-CM | POA: Diagnosis not present

## 2020-05-24 DIAGNOSIS — G8929 Other chronic pain: Secondary | ICD-10-CM | POA: Insufficient documentation

## 2020-05-24 DIAGNOSIS — R293 Abnormal posture: Secondary | ICD-10-CM | POA: Diagnosis not present

## 2020-05-24 NOTE — Therapy (Signed)
[place], [place], [address] Phone: [phone]   Fax:  [phone]  Physical Therapy Evaluation  Patient Details  Name: Gregory Hansen MRN: [id number] Date of Birth: [birth year] Referring Provider (PT): Bo Merino, MD   Encounter Date: 05/24/2020   PT End of Session - 05/24/20 1404    Visit Number 1    Number of Visits 12    Date for PT Re-Evaluation 07/05/20    Authorization Type U[employer] MCR, progress note by visit 10, recheck FOTO visit 6    PT Start Time 1100    PT Stop Time 1144    PT Time Calculation (min) 44 min    Activity Tolerance Patient tolerated treatment well    Behavior During Therapy [facility] for tasks assessed/performed           Past Medical History:  Diagnosis Date  . Acute hepatitis 01/26/2019  . Acute kidney failure ([place]) 03/30/2018  . AKI (acute kidney injury) ([place])   . Anginal pain ([place])   . Anxiety   . Arthritis    SPINE   . Arthritis   . Cellulitis 03/30/2018  . Coronary artery disease    s/p multiple percutaneous interventions, PCI 202 for DMI and DES distal RCA and CFX-OM 2006  . Coronary artery disease   . Depression   . Depression, major, single episode, mild ([place]) 06/15/2008   Qualifier: Diagnosis of  By: Orville Govern CMA, Carol    . GERD (gastroesophageal reflux disease)   . Hyperlipidemia    mixed  . Hyperlipidemia   . Hypertension   . Major depressive disorder, single episode 06/15/2008   Overview:  Overview:  Qualifier: Diagnosis of  By: Ronne Binning   . MI (myocardial infarction) ([place])   . Myocardial infarction [facility])    ? 2006  . Peripheral neuropathy    calves and both feet  . Peripheral neuropathy    calves and both feet  . Rhabdomyolysis 02/02/2019  . Severe sepsis with acute organ dysfunction ([place]) 12/03/2016  . Spondylitis, ankylosing ([place])     Past Surgical History:  Procedure Laterality Date  . ACHILLES TENDON REPAIR    . CARDIAC CATHETERIZATION      stent RCA June3, 2002, Stent OM/PTCA circumflex August 13, 2000  . CORONARY ANGIOPLASTY WITH STENT PLACEMENT     first one in 2006; had another place 3 months ago (August 2013)  . CORONARY ANGIOPLASTY WITH STENT PLACEMENT  03/26/2013   RCA           DR COOPER  . CORONARY ANGIOPLASTY WITH STENT PLACEMENT    . CORONARY ARTERY BYPASS GRAFT N/A 10/29/2013   Procedure: CORONARY ARTERY BYPASS GRAFTING x 4 (LIMA-LAD, SVG-OM, SVG-PD-PL) ENDOSCOPIC VEIN HARVEST RIGHT THIGH;  Surgeon: Gaye Pollack, MD;  Location: [facility] OR;  Service: Open Heart Surgery;  Laterality: N/A;  . CORONARY ARTERY BYPASS GRAFT    . FINGER SURGERY     5  TH     DIGIT RIGHT HAND  . FINGER SURGERY    . INTRAOPERATIVE TRANSESOPHAGEAL ECHOCARDIOGRAM N/A 10/29/2013   Procedure: INTRAOPERATIVE TRANSESOPHAGEAL ECHOCARDIOGRAM;  Surgeon: Gaye Pollack, MD;  Location: [facility] OR;  Service: Open Heart Surgery;  Laterality: N/A;  . KNEE ARTHROSCOPY W/ LASER  2003  . LEFT HEART CATHETERIZATION WITH CORONARY ANGIOGRAM N/A 10/01/2011   Procedure: LEFT HEART CATHETERIZATION WITH CORONARY ANGIOGRAM;  Surgeon: [name], MD;  Location: [facility] CATH LAB;  Service: Cardiovascular;  Laterality: N/A;  .  LEFT HEART CATHETERIZATION WITH CORONARY ANGIOGRAM N/A 03/26/2013   Procedure: LEFT HEART CATHETERIZATION WITH CORONARY ANGIOGRAM;  Surgeon: Blane Ohara, MD;  Location: [facility] CATH LAB;  Service: Cardiovascular;  Laterality: N/A;  . LEFT HEART CATHETERIZATION WITH CORONARY ANGIOGRAM N/A 10/25/2013   Procedure: LEFT HEART CATHETERIZATION WITH CORONARY ANGIOGRAM;  Surgeon: Blane Ohara, MD;  Location: [facility] CATH LAB;  Service: Cardiovascular;  Laterality: N/A;  . TONSILLECTOMY      There were no vitals filed for this visit.    Subjective Assessment - 05/24/20 1145    Subjective Pt. is a 70 y/o male with PMH significant for ankylosing spondylitis referred to PT for lumbar and thoracic pain. Pt. was previously seen for PT for this last year with d/c late  June with improvement noted at the time but reports symptoms have since gone "downhill". He reports difficulty maintaining upright posture for standing and ambulation (postural difficulty as well as exacerbation LBP) with tendency to need to lean forward on cart for activities such as grocery shopping. He has left gluteal region pain but otherwise no c/o LE radiating or radicular symptoms. Pt. also has PMH neuropathy affecting both feet otherwise no LE parasthesias noted. He reports intermittent use of rollator but otherwise ambulates independently without AD.    Pertinent History ankylosing spondylitis, MI, CABG, cellulitis, anxiety/depression, L1 fracture, rhabdomyolysis, neuropathy, see PMH for further details    Limitations Standing;Walking    Diagnostic tests X-rays    Patient Stated Goals be able to stand up straighter and improve walking ability    Currently in Pain? Yes    Pain Score 7     Pain Location Back    Pain Orientation Left;Mid;Lower    Pain Descriptors / Indicators Dull    Pain Type Chronic pain    Pain Radiating Towards left proximal gluteal region    Pain Onset More than a month ago    Pain Frequency Intermittent    Aggravating Factors  standing and walking    Pain Relieving Factors sitting or lying down    Effect of Pain on Daily Activities limits standing and walking tolerance, difficulty maintaining upright posture              [facility] PT Assessment - 05/24/20 0001      Assessment   Medical Diagnosis Ankylosing spondylitis, thoracic DDD, arthropathy of lumbar facet    Referring Provider (PT) Bo Merino, MD    Onset Date/Surgical Date --   referral 05/02/20, symptoms chronic/worsening since late June 2021   Prior Therapy past PT last year for LBP      Precautions   Precaution Comments ankylosing spondylitis with history L1 fracture, caution with trunk flexion      Restrictions   Weight Bearing Restrictions No      Balance Screen   Has the patient fallen  in the past 6 months No      [place] residence    [place]   lives with son   Home Access Stairs to enter    Entrance Stairs-Number of Steps 2    Entrance Stairs-Rails --   bar to hold onto on right side   Home Layout One level    [place] - 4 wheels      Prior Function   Level of Independence Independent with basic ADLs;Independent with community mobility without device      Cognition   Overall Cognitive Status Within Functional Limits for tasks  assessed      Observation/Other Assessments   Focus on Therapeutic Outcomes (FOTO)  55% function      Sensation   Light Touch Impaired Detail    Light Touch Impaired Details Impaired LLE;Impaired RLE    Additional Comments decreased sensation to bilat. feet with history of neuropathy-reports sensory loss 80% otherwise LE dermatomal screen intact proximal to feet      Posture/Postural Control   Posture/Postural Control Postural limitations    Postural Limitations Rounded Shoulders;Increased thoracic kyphosis;Forward head      ROM / Strength   AROM / PROM / Strength AROM;Strength      AROM   Overall AROM Comments Trunk AROM not formally tested to end-range flexion given underlying AS and history L1 fx., trunk extension limited to approximately 10 deg with increased local LBP, otherwise trunk AROM grossly WFL, bilat. hip AROM/PROM grossly [facility]      Strength   Overall Strength Comments Bilat. LE grossly 5/5 except hip abduction 4/5 bilat.      Flexibility   Soft Tissue Assessment /Muscle Length --   SLR 70 deg right, 80 deg left, mild piriformis and glut tightness left>right     Palpation   Palpation comment Pt. localizes pain around left proximal gluteal region-he has local TTP with trigger point this region      Special Tests   Other special tests SLR (-)                      Objective measurements completed on examination: See above findings.        [facility] Adult PT Treatment/Exercise - 05/24/20 0001      Exercises   Exercises Lumbar      Lumbar Exercises: Stretches   Single Knee to Chest Stretch 5 reps;Right;Left;10 seconds    Piriformis Stretch Left;2 reps;30 seconds      Lumbar Exercises: Supine   Pelvic Tilt 10 reps    Bent Knee Raise 10 reps    Isometric Hip Flexion 5 reps;5 seconds    Isometric Hip Flexion Limitations bilat.            Trigger Point Dry Needling - 05/24/20 0001    Consent Given? Yes    Education Handout Provided --   pt. familiar with procedure from past episode of care/handout previously provided   Muscles Treated Back/Hip Gluteus maximus    Dry Needling Comments needling to left proximal glut max in right sidelying with 30 gauge 75 mm needle                PT Education - 05/24/20 1403    Education Details HEP, POC, symptom etiology, FOTO patient report    Person(s) Educated Patient    Methods Explanation;Demonstration;Verbal cues;Handout    Comprehension Returned demonstration;Verbalized understanding               PT Long Term Goals - 05/24/20 1412      PT LONG TERM GOAL #1   Title Independent with HEP    Baseline instructed at eval, will add/update as appropriate    Time 6    Period Weeks    Status New    Target Date 07/05/20      PT LONG TERM GOAL #2   Title Improve FOTO outcome measure score to 60% or greater function    Baseline 55%    Time 6    Period Weeks    Status New    Target Date 07/05/20  PT LONG TERM GOAL #3   Title Tolerate standing/walking for activities including grocery shopping, community mobility with LBP 3/10 or less    Baseline 7/10, has to lean on cart for comfort    Time 6    Period Weeks    Status New    Target Date 07/05/20      PT LONG TERM GOAL #4   Title Increase bilat. hip abduction strength to 4+/5 or greater to improve lumbopelvic stability and help improve walking tolerance    Baseline 4/5 bilat.    Time 6    Period  Weeks    Status New    Target Date 07/05/20      PT LONG TERM GOAL #5   Title Pt. to be able to demo neutral lumbar spine posture for standing for chores with LBP 3/10 or less    Baseline underying postural kyphosis for thoracic region but difficulty maintaining neutral lumbar spine posture with tendency forward flexion    Time 6    Period Weeks    Status New    Target Date 07/05/20                  Plan - 05/24/20 1404    Clinical Impression Statement Pt. presents with chronic LBP>thoracic pain with underlying ankylosing spondylitis and DDD/facet arthropathy and history L1 fx. Symptoms of pain increased with standing and walking and eased with forward flexion on walker/grocery cart and sitting vs. lying down could be consistent with potential stenosis but no significant radiating/radicular symptoms or associated clinical findings at this time. Suspect also contributing myofascial pain in left glut region given local muscle tenderness and concordant pain on palpation. Pt. would benefit from PT to help relieve back pain symptoms and address associated functional limitations for standing and walking tolerance    Personal Factors and Comorbidities Comorbidity 3+    Comorbidities AS, DDD, MI, CABG, neuropathy, see PMH    Examination-Activity Limitations Locomotion Level;Stand    Examination-Participation Restrictions Community Activity;Shop    Stability/Clinical Decision Making Evolving/Moderate complexity    Clinical Decision Making Moderate    Rehab Potential Good    PT Frequency 2x / week    PT Duration 6 weeks    PT Treatment/Interventions ADLs/Self Care Home Management;Cryotherapy;Software engineer;Therapeutic exercise;Patient/family education;Manual techniques;Neuromuscular re-education;Balance training;Therapeutic activities;Functional mobility training;Dry needling;Taping    PT Next Visit Plan Ankylosing spondylitis with history L1 fx.-no spinal  manipulation/spinal mobs and caution with trunk flexion, continue/progress flexion bias ROM and lumbar/core stabilization, add upper back/postural strengthening and pec stretch, further dry needling prn    PT Home Exercise Plan Access code: [email or web address]    Consulted and Agree with Plan of Care Patient           Patient will benefit from skilled therapeutic intervention in order to improve the following deficits and impairments:  Postural dysfunction,Impaired flexibility,Decreased strength,Decreased activity tolerance,Decreased range of motion,Difficulty walking,Impaired sensation  Visit Diagnosis: Abnormal posture  Chronic low back pain without sciatica, unspecified back pain laterality  Pain in thoracic spine     Problem List Patient Active Problem List   Diagnosis Date Noted  . Puncture wound of right foot 03/07/2020  . Alcohol use disorder, mild, abuse 10/14/2019  . Chronic bilateral low back pain without sciatica 07/13/2019  . Numbness and tingling in right hand 07/13/2019  . Fall at home, initial encounter 02/02/2019  . Leg cramping 12/22/2018  . Rash 05/23/2018  . Peripheral neuropathy 11/04/2017  . Gynecomastia, male 02/18/2017  .  Healthcare maintenance 01/20/2017  . Aortic atherosclerosis ([place]) 12/03/2016  . History of cryptosporidiosis   . S/P CABG x 4 10/29/2013  . Hyperlipidemia 06/15/2008  . HTN, goal below 140/90 06/15/2008  . CAD (coronary artery disease) 06/15/2008  . SPONDYLITIS, ANKYLOSING 06/15/2008  . Atherosclerotic heart disease of native coronary artery without angina pectoris 06/15/2008  . Gastro-esophageal reflux disease without esophagitis 06/15/2008    Beaulah Dinning, PT, DPT 05/24/20 2:20 PM  [place] [facility] [address] [place], [place], [address] Phone: [phone]   Fax:  [phone]  Name: Gregory Hansen MRN: [id number] Date of Birth: 24-Jun-1950

## 2020-05-29 ENCOUNTER — Other Ambulatory Visit: Payer: Self-pay | Admitting: Cardiovascular Disease

## 2020-06-02 NOTE — Telephone Encounter (Signed)
Per [employer], [employer] PAP application was not received. Re-faxed completed application.  Fax: [phone] Phone: [phone]

## 2020-06-06 ENCOUNTER — Ambulatory Visit: Payer: Medicare Other | Admitting: Physical Therapy

## 2020-06-06 ENCOUNTER — Encounter: Payer: Self-pay | Admitting: Physical Therapy

## 2020-06-06 ENCOUNTER — Other Ambulatory Visit: Payer: Self-pay

## 2020-06-06 DIAGNOSIS — M546 Pain in thoracic spine: Secondary | ICD-10-CM | POA: Diagnosis not present

## 2020-06-06 DIAGNOSIS — G8929 Other chronic pain: Secondary | ICD-10-CM | POA: Diagnosis not present

## 2020-06-06 DIAGNOSIS — M545 Low back pain, unspecified: Secondary | ICD-10-CM | POA: Diagnosis not present

## 2020-06-06 DIAGNOSIS — R293 Abnormal posture: Secondary | ICD-10-CM

## 2020-06-06 NOTE — Therapy (Signed)
[place], [place], [address] Phone: [phone]   Fax:  [phone]  Physical Therapy Treatment  Patient Details  Name: Gregory Hansen MRN: [id number] Date of Birth: December 13, 1950 Referring Provider (PT): Bo Merino, MD   Encounter Date: 06/06/2020   PT End of Session - 06/06/20 1227    Visit Number 2    Number of Visits 12    Date for PT Re-Evaluation 07/05/20    Authorization Type UHC MCR, progress note by visit 10, recheck FOTO visit 6    PT Start Time 1147    PT Stop Time 1231    PT Time Calculation (min) 44 min    Activity Tolerance Patient tolerated treatment well    Behavior During Therapy [facility] for tasks assessed/performed           Past Medical History:  Diagnosis Date  . Acute hepatitis 01/26/2019  . Acute kidney failure ([place]) 03/30/2018  . AKI (acute kidney injury) ([place])   . Anginal pain ([place])   . Anxiety   . Arthritis    SPINE   . Arthritis   . Cellulitis 03/30/2018  . Coronary artery disease    s/p multiple percutaneous interventions, PCI 202 for DMI and DES distal RCA and CFX-OM 2006  . Coronary artery disease   . Depression   . Depression, major, single episode, mild ([place]) 06/15/2008   Qualifier: Diagnosis of  By: Orville Govern CMA, Carol    . GERD (gastroesophageal reflux disease)   . Hyperlipidemia    mixed  . Hyperlipidemia   . Hypertension   . Major depressive disorder, single episode 06/15/2008   Overview:  Overview:  Qualifier: Diagnosis of  By: Ronne Binning   . MI (myocardial infarction) ([place])   . Myocardial infarction [facility])    ? 2006  . Peripheral neuropathy    calves and both feet  . Peripheral neuropathy    calves and both feet  . Rhabdomyolysis 02/02/2019  . Severe sepsis with acute organ dysfunction ([place]) 12/03/2016  . Spondylitis, ankylosing ([place])     Past Surgical History:  Procedure Laterality Date  . ACHILLES TENDON REPAIR    . CARDIAC CATHETERIZATION      stent RCA June3, 2002, Stent OM/PTCA circumflex August 13, 2000  . CORONARY ANGIOPLASTY WITH STENT PLACEMENT     first one in 2006; had another place 3 months ago (August 2013)  . CORONARY ANGIOPLASTY WITH STENT PLACEMENT  03/26/2013   RCA           DR COOPER  . CORONARY ANGIOPLASTY WITH STENT PLACEMENT    . CORONARY ARTERY BYPASS GRAFT N/A 10/29/2013   Procedure: CORONARY ARTERY BYPASS GRAFTING x 4 (LIMA-LAD, SVG-OM, SVG-PD-PL) ENDOSCOPIC VEIN HARVEST RIGHT THIGH;  Surgeon: Gaye Pollack, MD;  Location: [facility] OR;  Service: Open Heart Surgery;  Laterality: N/A;  . CORONARY ARTERY BYPASS GRAFT    . FINGER SURGERY     5  TH     DIGIT RIGHT HAND  . FINGER SURGERY    . INTRAOPERATIVE TRANSESOPHAGEAL ECHOCARDIOGRAM N/A 10/29/2013   Procedure: INTRAOPERATIVE TRANSESOPHAGEAL ECHOCARDIOGRAM;  Surgeon: Gaye Pollack, MD;  [facility] OR;  Service: Open Heart Surgery;  Laterality: N/A;  . KNEE ARTHROSCOPY W/ LASER  2003  . LEFT HEART CATHETERIZATION WITH CORONARY ANGIOGRAM N/A 10/01/2011   Procedure: LEFT HEART CATHETERIZATION WITH CORONARY ANGIOGRAM;  Surgeon: [name], MD;  Location[facility] CATH LAB;  Service: Cardiovascular;  Laterality: N/A;  .  LEFT HEART CATHETERIZATION WITH CORONARY ANGIOGRAM N/A 03/26/2013   Procedure: LEFT HEART CATHETERIZATION WITH CORONARY ANGIOGRAM;  Surgeon: Blane Ohara, MD;  Location: [facility] CATH LAB;  Service: Cardiovascular;  Laterality: N/A;  . LEFT HEART CATHETERIZATION WITH CORONARY ANGIOGRAM N/A 10/25/2013   Procedure: LEFT HEART CATHETERIZATION WITH CORONARY ANGIOGRAM;  Surgeon: Blane Ohara, MD;  Location: [facility] CATH LAB;  Service: Cardiovascular;  Laterality: N/A;  . TONSILLECTOMY      There were no vitals filed for this visit.   Subjective Assessment - 06/06/20 1154    Subjective Not doing well today with back pain exacerbation-spent the last 24 hours in bed and using cane more to unload left side. Pain <1/10 at rest but up to 8-9/10 with standing and walking  in left side of low back but still no leg symptoms.    Pertinent History ankylosing spondylitis, MI, CABG, cellulitis, anxiety/depression, L1 fracture, rhabdomyolysis, neuropathy, see PMH for further details                             [facility] Adult PT Treatment/Exercise - 06/06/20 0001      Lumbar Exercises: Stretches   Passive Hamstring Stretch Right;Left;3 reps;30 seconds    Double Knee to Chest Stretch Limitations 2x10 with legs on 55 cm P-ball    Lower Trunk Rotation Limitations x 10 reps right side emphasis with legs on 55 cm P-ball    Pelvic Tilt 15 reps    Pelvic Tilt Limitations 3-5 second holds    Piriformis Stretch Left;3 reps;30 seconds      Lumbar Exercises: Aerobic   Nustep L5 x 5 min UE/LE      Lumbar Exercises: Supine   Dead Bug 15 reps    Dead Bug Limitations 2 lb. weight ea. UE/LE    Bridge 15 reps      Manual Therapy   Manual Therapy Joint mobilization    Joint Mobilization Left hip LAD grade I-III            Trigger Point Dry Needling - 06/06/20 0001    Consent Given? Yes    Muscles Treated Back/Hip Gluteus maximus;Erector spinae    Dry Needling Comments needling to left glut max and erector spinae at L4-5 region on left-75 mm 30 gauge needles x 2 for glut max and 50 mm 30 gauge needles x 2 for erector spinae    Electrical Stimulation Performed with Dry Needling Yes    E-stim with Dry Needling Details TENS 2 pps x 10 minutes                     PT Long Term Goals - 05/24/20 1412      PT LONG TERM GOAL #1   Title Independent with HEP    Baseline instructed at eval, will add/update as appropriate    Time 6    Period Weeks    Status New    Target Date 07/05/20      PT LONG TERM GOAL #2   Title Improve FOTO outcome measure score to 60% or greater function    Baseline 55%    Time 6    Period Weeks    Status New    Target Date 07/05/20      PT LONG TERM GOAL #3   Title Tolerate standing/walking for activities  including grocery shopping, community mobility with LBP 3/10 or less    Baseline 7/10, has to lean on  cart for comfort    Time 6    Period Weeks    Status New    Target Date 07/05/20      PT LONG TERM GOAL #4   Title Increase bilat. hip abduction strength to 4+/5 or greater to improve lumbopelvic stability and help improve walking tolerance    Baseline 4/5 bilat.    Time 6    Period Weeks    Status New    Target Date 07/05/20      PT LONG TERM GOAL #5   Title Pt. to be able to demo neutral lumbar spine posture for standing for chores with LBP 3/10 or less    Baseline underying postural kyphosis for thoracic region but difficulty maintaining neutral lumbar spine posture with tendency forward flexion    Time 6    Period Weeks    Status New    Target Date 07/05/20                 Plan - 06/06/20 1240    Clinical Impression Statement Limited change in status this far with continued LBP worse with walking/standing and eased with rest anf forward flexion. Presentation still consistent with potential underlying stenosis however still no LE radiating symptoms. Tx. today for flexion bias exercises/stabilization and stretches along with manual and dry needling well-tolerated and pt. noted improved ability to stand up straight post-tx. but progress with therapy goals for standing tolerance and ambulation ongoing.    Personal Factors and Comorbidities Comorbidity 3+    Comorbidities AS, DDD, MI, CABG, neuropathy, see PMH    Examination-Activity Limitations Locomotion Level;Stand    Examination-Participation Restrictions Community Activity;Shop    Stability/Clinical Decision Making Evolving/Moderate complexity    Clinical Decision Making Moderate    Rehab Potential Good    PT Frequency 2x / week    PT Duration 6 weeks    PT Treatment/Interventions ADLs/Self Care Home Management;Cryotherapy;Software engineer;Therapeutic exercise;Patient/family  education;Manual techniques;Neuromuscular re-education;Balance training;Therapeutic activities;Functional mobility training;Dry needling;Taping    PT Next Visit Plan Ankylosing spondylitis with history L1 fx.-no spinal manipulation/spinal mobs and caution with trunk flexion, continue/progress flexion bias ROM and lumbar/core stabilization, add upper back/postural strengthening and pec stretch, further dry needling prn    PT Home Exercise Plan Access code: [email or web address]    Consulted and Agree with Plan of Care Patient           Patient will benefit from skilled therapeutic intervention in order to improve the following deficits and impairments:  Postural dysfunction,Impaired flexibility,Decreased strength,Decreased activity tolerance,Decreased range of motion,Difficulty walking,Impaired sensation  Visit Diagnosis: Abnormal posture  Chronic low back pain without sciatica, unspecified back pain laterality  Pain in thoracic spine     Problem List Patient Active Problem List   Diagnosis Date Noted  . Puncture wound of right foot 03/07/2020  . Alcohol use disorder, mild, abuse 10/14/2019  . Chronic bilateral low back pain without sciatica 07/13/2019  . Numbness and tingling in right hand 07/13/2019  . Fall at home, initial encounter 02/02/2019  . Leg cramping 12/22/2018  . Rash 05/23/2018  . Peripheral neuropathy 11/04/2017  . Gynecomastia, male 02/18/2017  . Healthcare maintenance 01/20/2017  . Aortic atherosclerosis ([place]) 12/03/2016  . History of cryptosporidiosis   . S/P CABG x 4 10/29/2013  . Hyperlipidemia 06/15/2008  . HTN, goal below 140/90 06/15/2008  . CAD (coronary artery disease) 06/15/2008  . SPONDYLITIS, ANKYLOSING 06/15/2008  . Atherosclerotic heart disease of native coronary artery without angina pectoris 06/15/2008  .  Gastro-esophageal reflux disease without esophagitis 06/15/2008    Beaulah Dinning, PT, DPT 06/06/20 12:44 PM  [facility] [address] [place], [place], [address] Phone: [phone]   Fax:  [phone]  Name: SHERVIN CYPERT MRN: [id number] Date of Birth: 1950-09-06

## 2020-06-08 ENCOUNTER — Ambulatory Visit: Payer: Medicare Other | Admitting: Physical Therapy

## 2020-06-08 ENCOUNTER — Encounter: Payer: Self-pay | Admitting: Physical Therapy

## 2020-06-08 ENCOUNTER — Other Ambulatory Visit: Payer: Self-pay

## 2020-06-08 DIAGNOSIS — G8929 Other chronic pain: Secondary | ICD-10-CM | POA: Diagnosis not present

## 2020-06-08 DIAGNOSIS — M545 Low back pain, unspecified: Secondary | ICD-10-CM | POA: Diagnosis not present

## 2020-06-08 DIAGNOSIS — R293 Abnormal posture: Secondary | ICD-10-CM | POA: Diagnosis not present

## 2020-06-08 DIAGNOSIS — M546 Pain in thoracic spine: Secondary | ICD-10-CM | POA: Diagnosis not present

## 2020-06-08 NOTE — Therapy (Signed)
[place], [place], [address] Phone: [phone]   Fax:  [phone]  Physical Therapy Treatment  Patient Details  Name: Gregory Hansen MRN: [id number] Date of Birth: 14-Jan-1951 Referring Provider (PT): Bo Merino, MD   Encounter Date: 06/08/2020   PT End of Session - 06/08/20 1700    Visit Number 3    Number of Visits 12    Date for PT Re-Evaluation 07/05/20    Authorization Type UHC MCR, progress note by visit 10, recheck FOTO visit 6    PT Start Time 1502    PT Stop Time 1545    PT Time Calculation (min) 43 min    Activity Tolerance Patient tolerated treatment well    Behavior During Therapy [facility] for tasks assessed/performed           Past Medical History:  Diagnosis Date  . Acute hepatitis 01/26/2019  . Acute kidney failure ([place]) 03/30/2018  . AKI (acute kidney injury) ([place])   . Anginal pain ([place])   . Anxiety   . Arthritis    SPINE   . Arthritis   . Cellulitis 03/30/2018  . Coronary artery disease    s/p multiple percutaneous interventions, PCI 202 for DMI and DES distal RCA and CFX-OM 2006  . Coronary artery disease   . Depression   . Depression, major, single episode, mild ([place]) 06/15/2008   Qualifier: Diagnosis of  By: Orville Govern CMA, Carol    . GERD (gastroesophageal reflux disease)   . Hyperlipidemia    mixed  . Hyperlipidemia   . Hypertension   . Major depressive disorder, single episode 06/15/2008   Overview:  Overview:  Qualifier: Diagnosis of  By: Ronne Binning   . MI (myocardial infarction) ([place])   . Myocardial infarction [facility])    ? 2006  . Peripheral neuropathy    calves and both feet  . Peripheral neuropathy    calves and both feet  . Rhabdomyolysis 02/02/2019  . Severe sepsis with acute organ dysfunction ([place]) 12/03/2016  . Spondylitis, ankylosing ([place])     Past Surgical History:  Procedure Laterality Date  . ACHILLES TENDON REPAIR    . CARDIAC CATHETERIZATION      stent RCA June3, 2002, Stent OM/PTCA circumflex August 13, 2000  . CORONARY ANGIOPLASTY WITH STENT PLACEMENT     first one in 2006; had another place 3 months ago (August 2013)  . CORONARY ANGIOPLASTY WITH STENT PLACEMENT  03/26/2013   RCA           DR COOPER  . CORONARY ANGIOPLASTY WITH STENT PLACEMENT    . CORONARY ARTERY BYPASS GRAFT N/A 10/29/2013   Procedure: CORONARY ARTERY BYPASS GRAFTING x 4 (LIMA-LAD, SVG-OM, SVG-PD-PL) ENDOSCOPIC VEIN HARVEST RIGHT THIGH;  Surgeon: Gaye Pollack, MD;  Location: [place] OR;  Service: Open Heart Surgery;  Laterality: N/A;  . CORONARY ARTERY BYPASS GRAFT    . FINGER SURGERY     5  TH     DIGIT RIGHT HAND  . FINGER SURGERY    . INTRAOPERATIVE TRANSESOPHAGEAL ECHOCARDIOGRAM N/A 10/29/2013   Procedure: INTRAOPERATIVE TRANSESOPHAGEAL ECHOCARDIOGRAM;  Surgeon: Gaye Pollack, MD;  Location: [facility] OR;  Service: Open Heart Surgery;  Laterality: N/A;  . KNEE ARTHROSCOPY W/ LASER  2003  . LEFT HEART CATHETERIZATION WITH CORONARY ANGIOGRAM N/A 10/01/2011   Procedure: LEFT HEART CATHETERIZATION WITH CORONARY ANGIOGRAM;  Surgeon: [name], MD;  Location: [facility] CATH LAB;  Service: Cardiovascular;  Laterality: N/A;  .  LEFT HEART CATHETERIZATION WITH CORONARY ANGIOGRAM N/A 03/26/2013   Procedure: LEFT HEART CATHETERIZATION WITH CORONARY ANGIOGRAM;  Surgeon: Blane Ohara, MD;  Location: [facility] CATH LAB;  Service: Cardiovascular;  Laterality: N/A;  . LEFT HEART CATHETERIZATION WITH CORONARY ANGIOGRAM N/A 10/25/2013   Procedure: LEFT HEART CATHETERIZATION WITH CORONARY ANGIOGRAM;  Surgeon: Blane Ohara, MD;  Location: [facility] CATH LAB;  Service: Cardiovascular;  Laterality: N/A;  . TONSILLECTOMY      There were no vitals filed for this visit.   Subjective Assessment - 06/08/20 1655    Subjective Temporary ease after last session with improved ability to stand up straight but standing/walking tolerance still limited. No overt leg pain but pt. reports legs feel "tired" with  prolonged standing and walking with "aching" at times.    Pertinent History ankylosing spondylitis, MI, CABG, cellulitis, anxiety/depression, L1 fracture, rhabdomyolysis, neuropathy, see PMH for further details    Currently in Pain? --   no pain at rest pre-tx. but increases with standing/walking up to 7-9/10 in left lumbar and gluteal region                            [facility] Adult PT Treatment/Exercise - 06/08/20 0001      Lumbar Exercises: Stretches   Double Knee to Chest Stretch Limitations 2x10 with legs on 65 cm P-ball    Lower Trunk Rotation Limitations 2 x 10 reps right side emphasis with legs on 55 cm P-ball    Pelvic Tilt 20 reps    Pelvic Tilt Limitations 3-5 second holds    Other Lumbar Stretch Exercise doorway pec stretch 20 sec x 3      Lumbar Exercises: Standing   Row Limitations Freemotion bilat. cable row 10 lbs. 2x10      Lumbar Exercises: Supine   Dead Bug 20 reps    Dead Bug Limitations 2 lb. ankle weight ea. leg and 3 lb. DB ea. UE      Manual Therapy   Manual therapy comments brief STM left lumbar region for assessment of muscle tenderness    Joint Mobilization LAD bilat. hips left side focus grade I-III            Trigger Point Dry Needling - 06/08/20 0001    Consent Given? Yes    Muscles Treated Back/Hip Gluteus maximus;Erector spinae    Dry Needling Comments needling to left glut max and erector spinae at L4-5 region on left with 30 gauge 50 mm needles    Electrical Stimulation Performed with Dry Needling Yes    E-stim with Dry Needling Details TENS 2 pps x 10 minutes                PT Education - 06/08/20 1700    Education Details POC    Person(s) Educated Patient    Methods Explanation    Comprehension Verbalized understanding               PT Long Term Goals - 05/24/20 1412      PT LONG TERM GOAL #1   Title Independent with HEP    Baseline instructed at eval, will add/update as appropriate    Time 6    Period  Weeks    Status New    Target Date 07/05/20      PT LONG TERM GOAL #2   Title Improve FOTO outcome measure score to 60% or greater function    Baseline 55%  Time 6    Period Weeks    Status New    Target Date 07/05/20      PT LONG TERM GOAL #3   Title Tolerate standing/walking for activities including grocery shopping, community mobility with LBP 3/10 or less    Baseline 7/10, has to lean on cart for comfort    Time 6    Period Weeks    Status New    Target Date 07/05/20      PT LONG TERM GOAL #4   Title Increase bilat. hip abduction strength to 4+/5 or greater to improve lumbopelvic stability and help improve walking tolerance    Baseline 4/5 bilat.    Time 6    Period Weeks    Status New    Target Date 07/05/20      PT LONG TERM GOAL #5   Title Pt. to be able to demo neutral lumbar spine posture for standing for chores with LBP 3/10 or less    Baseline underying postural kyphosis for thoracic region but difficulty maintaining neutral lumbar spine posture with tendency forward flexion    Time 6    Period Weeks    Status New    Target Date 07/05/20                 Plan - 06/08/20 1701    Clinical Impression Statement As with last session pt. noted post-tx. relief with ability for upright posture but limited change so far and in standing and walking tolerance. Still no overt radicular symptoms so suspect pain could be facet mediated but symptoms reported today of legs feeling tired/aching could potentially be associated with stenosis but will continue to monitor. Given history of chronic LBP and underlying AS expect progress will be gradual but if continuing to note significant limitations of standing and walking tolerance in the next few visits would consider check with referring provider to see if further imaging needed.    Personal Factors and Comorbidities Comorbidity 3+    Comorbidities AS, DDD, MI, CABG, neuropathy, see PMH    Examination-Activity Limitations  Locomotion Level;Stand    Examination-Participation Restrictions Community Activity;Shop    Stability/Clinical Decision Making Evolving/Moderate complexity    Clinical Decision Making Moderate    Rehab Potential Good    PT Frequency 2x / week    PT Duration 6 weeks    PT Treatment/Interventions ADLs/Self Care Home Management;Cryotherapy;Software engineer;Therapeutic exercise;Patient/family education;Manual techniques;Neuromuscular re-education;Balance training;Therapeutic activities;Functional mobility training;Dry needling;Taping    PT Next Visit Plan Ankylosing spondylitis with history L1 fx.-no spinal manipulation/spinal mobs and caution with trunk flexion, continue/progress flexion bias ROM and lumbar/core stabilization, add upper back/postural strengthening and pec stretch, further dry needling prn    PT Home Exercise Plan Access code: [email or web address]    Consulted and Agree with Plan of Care Patient           Patient will benefit from skilled therapeutic intervention in order to improve the following deficits and impairments:  Postural dysfunction,Impaired flexibility,Decreased strength,Decreased activity tolerance,Decreased range of motion,Difficulty walking,Impaired sensation  Visit Diagnosis: Abnormal posture  Chronic low back pain without sciatica, unspecified back pain laterality  Pain in thoracic spine     Problem List Patient Active Problem List   Diagnosis Date Noted  . Puncture wound of right foot 03/07/2020  . Alcohol use disorder, mild, abuse 10/14/2019  . Chronic bilateral low back pain without sciatica 07/13/2019  . Numbness and tingling in right hand 07/13/2019  . Fall at home, initial  encounter 02/02/2019  . Leg cramping 12/22/2018  . Rash 05/23/2018  . Peripheral neuropathy 11/04/2017  . Gynecomastia, male 02/18/2017  . Healthcare maintenance 01/20/2017  . Aortic atherosclerosis ([place]) 12/03/2016  . History of cryptosporidiosis   .  S/P CABG x 4 10/29/2013  . Hyperlipidemia 06/15/2008  . HTN, goal below 140/90 06/15/2008  . CAD (coronary artery disease) 06/15/2008  . SPONDYLITIS, ANKYLOSING 06/15/2008  . Atherosclerotic heart disease of native coronary artery without angina pectoris 06/15/2008  . Gastro-esophageal reflux disease without esophagitis 06/15/2008    Beaulah Dinning, PT, DPT 06/08/20 5:06 PM  [facility] [address] [place], [place], [address] Phone: [phone]   Fax:  [phone]  Name: Gregory Hansen MRN: [id number] Date of Birth: [birth year]

## 2020-06-09 NOTE — Telephone Encounter (Signed)
Called [employer] to check on patient's application. Rep states patient's application lists 2 ppl in the household, but income was only submitted for 1 person. Rep states that if the other occupant is a child, or they have separate bills/etc, then patient can call to check household size over the phone. Turnaround would be up to 3 business days once received.  Called patient and advised. Other occupant is his son. He will call [employer] to update.   Phone: [phone]

## 2020-06-13 ENCOUNTER — Ambulatory Visit: Payer: Medicare Other | Attending: Rheumatology | Admitting: Physical Therapy

## 2020-06-13 ENCOUNTER — Encounter: Payer: Self-pay | Admitting: Physical Therapy

## 2020-06-13 ENCOUNTER — Other Ambulatory Visit: Payer: Self-pay

## 2020-06-13 DIAGNOSIS — M546 Pain in thoracic spine: Secondary | ICD-10-CM | POA: Diagnosis not present

## 2020-06-13 DIAGNOSIS — G8929 Other chronic pain: Secondary | ICD-10-CM | POA: Diagnosis not present

## 2020-06-13 DIAGNOSIS — R293 Abnormal posture: Secondary | ICD-10-CM | POA: Diagnosis not present

## 2020-06-13 DIAGNOSIS — M545 Low back pain, unspecified: Secondary | ICD-10-CM | POA: Insufficient documentation

## 2020-06-13 NOTE — Therapy (Signed)
[place], [place], [address] Phone: [phone]   Fax:  [phone]  Physical Therapy Treatment  Patient Details  Name: Gregory Hansen MRN: [id number] Date of Birth: Jun 22, 1950 Referring Provider (PT): Bo Merino, MD   Encounter Date: 06/13/2020   PT End of Session - 06/13/20 1341    Visit Number 4    Number of Visits 12    Date for PT Re-Evaluation 07/05/20    Authorization Type UHC MCR, progress note by visit 10, recheck FOTO visit 6    PT Start Time 1334    PT Stop Time 1415    PT Time Calculation (min) 41 min    Activity Tolerance --   standing/walking tolerance limited due to leg fatigue otherwise session well-tolerated   Behavior During Therapy [facility] for tasks assessed/performed           Past Medical History:  Diagnosis Date  . Acute hepatitis 01/26/2019  . Acute kidney failure ([place]) 03/30/2018  . AKI (acute kidney injury) ([place])   . Anginal pain ([place])   . Anxiety   . Arthritis    SPINE   . Arthritis   . Cellulitis 03/30/2018  . Coronary artery disease    s/p multiple percutaneous interventions, PCI 202 for DMI and DES distal RCA and CFX-OM 2006  . Coronary artery disease   . Depression   . Depression, major, single episode, mild ([place]) 06/15/2008   Qualifier: Diagnosis of  By: Orville Govern CMA, Carol    . GERD (gastroesophageal reflux disease)   . Hyperlipidemia    mixed  . Hyperlipidemia   . Hypertension   . Major depressive disorder, single episode 06/15/2008   Overview:  Overview:  Qualifier: Diagnosis of  By: Ronne Binning   . MI (myocardial infarction) ([place])   . Myocardial infarction [facility])    ? 2006  . Peripheral neuropathy    calves and both feet  . Peripheral neuropathy    calves and both feet  . Rhabdomyolysis 02/02/2019  . Severe sepsis with acute organ dysfunction ([place]) 12/03/2016  . Spondylitis, ankylosing ([place])     Past Surgical History:  Procedure Laterality Date   . ACHILLES TENDON REPAIR    . CARDIAC CATHETERIZATION     stent RCA June3, 2002, Stent OM/PTCA circumflex August 13, 2000  . CORONARY ANGIOPLASTY WITH STENT PLACEMENT     first one in 2006; had another place 3 months ago (August 2013)  . CORONARY ANGIOPLASTY WITH STENT PLACEMENT  03/26/2013   RCA           DR COOPER  . CORONARY ANGIOPLASTY WITH STENT PLACEMENT    . CORONARY ARTERY BYPASS GRAFT N/A 10/29/2013   Procedure: CORONARY ARTERY BYPASS GRAFTING x 4 (LIMA-LAD, SVG-OM, SVG-PD-PL) ENDOSCOPIC VEIN HARVEST RIGHT THIGH;  Surgeon: Gaye Pollack, MD;  Location: [place] OR;  Service: Open Heart Surgery;  Laterality: N/A;  . CORONARY ARTERY BYPASS GRAFT    . FINGER SURGERY     5  TH     DIGIT RIGHT HAND  . FINGER SURGERY    . INTRAOPERATIVE TRANSESOPHAGEAL ECHOCARDIOGRAM N/A 10/29/2013   Procedure: INTRAOPERATIVE TRANSESOPHAGEAL ECHOCARDIOGRAM;  Surgeon: Gaye Pollack, MD;  Location: [facility] OR;  Service: Open Heart Surgery;  Laterality: N/A;  . KNEE ARTHROSCOPY W/ LASER  2003  . LEFT HEART CATHETERIZATION WITH CORONARY ANGIOGRAM N/A 10/01/2011   Procedure: LEFT HEART CATHETERIZATION WITH CORONARY ANGIOGRAM;  Surgeon: [name], MD;  Location: [facility] CATH  LAB;  Service: Cardiovascular;  Laterality: N/A;  . LEFT HEART CATHETERIZATION WITH CORONARY ANGIOGRAM N/A 03/26/2013   Procedure: LEFT HEART CATHETERIZATION WITH CORONARY ANGIOGRAM;  Surgeon: Blane Ohara, MD;  Location: [facility] CATH LAB;  Service: Cardiovascular;  Laterality: N/A;  . LEFT HEART CATHETERIZATION WITH CORONARY ANGIOGRAM N/A 10/25/2013   Procedure: LEFT HEART CATHETERIZATION WITH CORONARY ANGIOGRAM;  Surgeon: Blane Ohara, MD;  Location: [facility] CATH LAB;  Service: Cardiovascular;  Laterality: N/A;  . TONSILLECTOMY      There were no vitals filed for this visit.   Subjective Assessment - 06/13/20 1335    Subjective Pain a little less this week but continues with similar issues with limited standing and walking tolerance. No pain pre-tx.     Pertinent History ankylosing spondylitis, MI, CABG, cellulitis, anxiety/depression, L1 fracture, rhabdomyolysis, neuropathy, see PMH for further details    Currently in Pain? No/denies                             [facility] Adult PT Treatment/Exercise - 06/13/20 0001      Lumbar Exercises: Stretches   Passive Hamstring Stretch Left;Right;3 reps;30 seconds    Double Knee to Chest Stretch Limitations 15 reps with pt. holding knees    Piriformis Stretch Left;Right;3 reps;30 seconds      Lumbar Exercises: Aerobic   Tread Mill 1.8 mph 2% grade x 3 min   stopoped after 3 min due to c/o legs feeling weak     Lumbar Exercises: Standing   Row Limitations Freemotion cable row 10 lbs. 2x10 bilat. UE    Shoulder Extension Limitations Freemotion cable extension 2x10 7 lbs. bilat. UE      Lumbar Exercises: Supine   Dead Bug 20 reps    Bridge with Ball Squeeze 20 reps    Other Supine Lumbar Exercises clamshell blue band 2x10            Trigger Point Dry Needling - 06/13/20 0001    Consent Given? Yes    Muscles Treated Back/Hip Gluteus maximus;Erector spinae    Dry Needling Comments needling to left glut max and erector spinae at L4-5 region on left with 30 gauge 50 mm needles    Electrical Stimulation Performed with Dry Needling Yes    E-stim with Dry Needling Details TENS 2 pps x 10 minutes                     PT Long Term Goals - 05/24/20 1412      PT LONG TERM GOAL #1   Title Independent with HEP    Baseline instructed at eval, will add/update as appropriate    Time 6    Period Weeks    Status New    Target Date 07/05/20      PT LONG TERM GOAL #2   Title Improve FOTO outcome measure score to 60% or greater function    Baseline 55%    Time 6    Period Weeks    Status New    Target Date 07/05/20      PT LONG TERM GOAL #3   Title Tolerate standing/walking for activities including grocery shopping, community mobility with LBP 3/10 or less     Baseline 7/10, has to lean on cart for comfort    Time 6    Period Weeks    Status New    Target Date 07/05/20      PT LONG  TERM GOAL #4   Title Increase bilat. hip abduction strength to 4+/5 or greater to improve lumbopelvic stability and help improve walking tolerance    Baseline 4/5 bilat.    Time 6    Period Weeks    Status New    Target Date 07/05/20      PT LONG TERM GOAL #5   Title Pt. to be able to demo neutral lumbar spine posture for standing for chores with LBP 3/10 or less    Baseline underying postural kyphosis for thoracic region but difficulty maintaining neutral lumbar spine posture with tendency forward flexion    Time 6    Period Weeks    Status New    Target Date 07/05/20                 Plan - 06/13/20 1351    Clinical Impression Statement Attempted warm up on TM with incline to promote lumbar flexion but tolerance limited to 3 minutes due to c/o legs feeling weak>painful. Fair status with symptoms still potentially concerning for neurogenic claudication so if limitations persist over the next several visits plan recommend refer back to MD to see if MRI needed.    Personal Factors and Comorbidities Comorbidity 3+    Comorbidities AS, DDD, MI, CABG, neuropathy, see PMH    Examination-Activity Limitations Locomotion Level;Stand    Examination-Participation Restrictions Community Activity;Shop    Stability/Clinical Decision Making Evolving/Moderate complexity    Clinical Decision Making Moderate    Rehab Potential Good    PT Frequency 2x / week    PT Duration 6 weeks    PT Treatment/Interventions ADLs/Self Care Home Management;Cryotherapy;Software engineer;Therapeutic exercise;Patient/family education;Manual techniques;Neuromuscular re-education;Balance training;Therapeutic activities;Functional mobility training;Dry needling;Taping    PT Next Visit Plan Ankylosing spondylitis with history L1 fx.-no spinal manipulation/spinal  mobs and caution with trunk flexion, continue/progress flexion bias ROM and lumbar/core stabilization, add upper back/postural strengthening and pec stretch, further dry needling prn    PT Home Exercise Plan Access code: [email or web address]    Consulted and Agree with Plan of Care Patient           Patient will benefit from skilled therapeutic intervention in order to improve the following deficits and impairments:  Postural dysfunction,Impaired flexibility,Decreased strength,Decreased activity tolerance,Decreased range of motion,Difficulty walking,Impaired sensation  Visit Diagnosis: Abnormal posture  Chronic low back pain without sciatica, unspecified back pain laterality  Pain in thoracic spine  Chronic bilateral low back pain without sciatica     Problem List Patient Active Problem List   Diagnosis Date Noted  . Puncture wound of right foot 03/07/2020  . Alcohol use disorder, mild, abuse 10/14/2019  . Chronic bilateral low back pain without sciatica 07/13/2019  . Numbness and tingling in right hand 07/13/2019  . Fall at home, initial encounter 02/02/2019  . Leg cramping 12/22/2018  . Rash 05/23/2018  . Peripheral neuropathy 11/04/2017  . Gynecomastia, male 02/18/2017  . Healthcare maintenance 01/20/2017  . Aortic atherosclerosis ([place]) 12/03/2016  . History of cryptosporidiosis   . S/P CABG x 4 10/29/2013  . Hyperlipidemia 06/15/2008  . HTN, goal below 140/90 06/15/2008  . CAD (coronary artery disease) 06/15/2008  . SPONDYLITIS, ANKYLOSING 06/15/2008  . Atherosclerotic heart disease of native coronary artery without angina pectoris 06/15/2008  . Gastro-esophageal reflux disease without esophagitis 06/15/2008    Beaulah Dinning, PT, DPT 06/13/20 2:07 PM  [facility] [address] [place], [place], [address] Phone: [phone]   Fax:  [phone]  Name: CALEEL KINER MRN: [id number] Date of Birth: [birth year]

## 2020-06-15 ENCOUNTER — Encounter: Payer: Self-pay | Admitting: Physical Therapy

## 2020-06-15 ENCOUNTER — Other Ambulatory Visit: Payer: Self-pay

## 2020-06-15 ENCOUNTER — Ambulatory Visit: Payer: Medicare Other | Admitting: Physical Therapy

## 2020-06-15 DIAGNOSIS — G8929 Other chronic pain: Secondary | ICD-10-CM | POA: Diagnosis not present

## 2020-06-15 DIAGNOSIS — M546 Pain in thoracic spine: Secondary | ICD-10-CM

## 2020-06-15 DIAGNOSIS — R293 Abnormal posture: Secondary | ICD-10-CM | POA: Diagnosis not present

## 2020-06-15 DIAGNOSIS — M545 Low back pain, unspecified: Secondary | ICD-10-CM

## 2020-06-15 NOTE — Therapy (Signed)
[place], [place], [address] Phone: [phone]   Fax:  [phone]  Physical Therapy Treatment  Patient Details  Name: Gregory Hansen MRN: [id number] Date of Birth: Jul 31, 1950 Referring Provider (PT): Bo Merino, MD   Encounter Date: 06/15/2020   PT End of Session - 06/15/20 1220    Visit Number 5    Number of Visits 12    Date for PT Re-Evaluation 07/05/20    Authorization Type [employer] MCR, progress note by visit 10, recheck FOTO visit 6    PT Start Time 1139    PT Stop Time 1228    PT Time Calculation (min) 49 min    Activity Tolerance Patient tolerated treatment well    Behavior During Therapy [facility] for tasks assessed/performed           Past Medical History:  Diagnosis Date  . Acute hepatitis 01/26/2019  . Acute kidney failure ([place]) 03/30/2018  . AKI (acute kidney injury) ([place])   . Anginal pain ([place])   . Anxiety   . Arthritis    SPINE   . Arthritis   . Cellulitis 03/30/2018  . Coronary artery disease    s/p multiple percutaneous interventions, PCI 202 for DMI and DES distal RCA and CFX-OM 2006  . Coronary artery disease   . Depression   . Depression, major, single episode, mild ([place]) 06/15/2008   Qualifier: Diagnosis of  By: Orville Govern CMA, Carol    . GERD (gastroesophageal reflux disease)   . Hyperlipidemia    mixed  . Hyperlipidemia   . Hypertension   . Major depressive disorder, single episode 06/15/2008   Overview:  Overview:  Qualifier: Diagnosis of  By: Ronne Binning   . MI (myocardial infarction) ([place])   . Myocardial infarction [facility])    ? 2006  . Peripheral neuropathy    calves and both feet  . Peripheral neuropathy    calves and both feet  . Rhabdomyolysis 02/02/2019  . Severe sepsis with acute organ dysfunction ([place]) 12/03/2016  . Spondylitis, ankylosing ([place])     Past Surgical History:  Procedure Laterality Date  . ACHILLES TENDON REPAIR    . CARDIAC CATHETERIZATION      stent RCA June3, 2002, Stent OM/PTCA circumflex August 13, 2000  . CORONARY ANGIOPLASTY WITH STENT PLACEMENT     first one in 2006; had another place 3 months ago (August 2013)  . CORONARY ANGIOPLASTY WITH STENT PLACEMENT  03/26/2013   RCA           DR COOPER  . CORONARY ANGIOPLASTY WITH STENT PLACEMENT    . CORONARY ARTERY BYPASS GRAFT N/A 10/29/2013   Procedure: CORONARY ARTERY BYPASS GRAFTING x 4 (LIMA-LAD, SVG-OM, SVG-PD-PL) ENDOSCOPIC VEIN HARVEST RIGHT THIGH;  Surgeon: Gaye Pollack, MD;  Location: [facility] OR;  Service: Open Heart Surgery;  Laterality: N/A;  . CORONARY ARTERY BYPASS GRAFT    . FINGER SURGERY     5  TH     DIGIT RIGHT HAND  . FINGER SURGERY    . INTRAOPERATIVE TRANSESOPHAGEAL ECHOCARDIOGRAM N/A 10/29/2013   Procedure: INTRAOPERATIVE TRANSESOPHAGEAL ECHOCARDIOGRAM;  Surgeon: Gaye Pollack, MD;  Location: [facility] OR;  Service: Open Heart Surgery;  Laterality: N/A;  . KNEE ARTHROSCOPY W/ LASER  2003  . LEFT HEART CATHETERIZATION WITH CORONARY ANGIOGRAM N/A 10/01/2011   Procedure: LEFT HEART CATHETERIZATION WITH CORONARY ANGIOGRAM;  Surgeon: [name], MD;  Location: [facility] CATH LAB;  Service: Cardiovascular;  Laterality: N/A;  .  LEFT HEART CATHETERIZATION WITH CORONARY ANGIOGRAM N/A 03/26/2013   Procedure: LEFT HEART CATHETERIZATION WITH CORONARY ANGIOGRAM;  Surgeon: Blane Ohara, MD;  Location: [facility] CATH LAB;  Service: Cardiovascular;  Laterality: N/A;  . LEFT HEART CATHETERIZATION WITH CORONARY ANGIOGRAM N/A 10/25/2013   Procedure: LEFT HEART CATHETERIZATION WITH CORONARY ANGIOGRAM;  Surgeon: Blane Ohara, MD;  Location: [facility] CATH LAB;  Service: Cardiovascular;  Laterality: N/A;  . TONSILLECTOMY      There were no vitals filed for this visit.   Subjective Assessment - 06/15/20 1144    Subjective Pt. reports having some issues with increased right foot/LE swelling-taking Microzide medication to address. Back feeling a little better and not as painful to stand today.     Pertinent History ankylosing spondylitis, MI, CABG, cellulitis, anxiety/depression, L1 fracture, rhabdomyolysis, neuropathy, see PMH for further details    Currently in Pain? No/denies                             [facility] Adult PT Treatment/Exercise - 06/15/20 0001      Lumbar Exercises: Stretches   Passive Hamstring Stretch Left;Right;3 reps;30 seconds    Double Knee to Chest Stretch Limitations 20 reps with legs on 55 cm P-ball    Lower Trunk Rotation Limitations right LTR with legs on 55 cm P-ball x 10 reps   to right side only-pt. had increased left lumbar pain with left rotation so held this side   Piriformis Stretch Left;Right;3 reps;30 seconds      Lumbar Exercises: Aerobic   Nustep L6 x 5 min UE/LE      Lumbar Exercises: Standing   Row Limitations Freemotion cable row 13 lbs. 2x10 bilat. UE    Shoulder Extension Limitations Freemotion cable extension 7 lbs. bilat. UE 2x10    Other Standing Lumbar Exercises Theraband horizontal abduction green band 2x10, doorway pec stretch 3x20 sec      Lumbar Exercises: Supine   Dead Bug 20 reps    Bridge with clamshell 20 reps   green           Trigger Point Dry Needling - 06/15/20 0001    Consent Given? Yes    Muscles Treated Back/Hip Gluteus maximus;Erector spinae    Dry Needling Comments needling to left glut max and erector spinae at L4-5 region on left with 30 gauge 50 mm needles    Electrical Stimulation Performed with Dry Needling Yes    E-stim with Dry Needling Details TENS 2 pps x 10 minutes                     PT Long Term Goals - 05/24/20 1412      PT LONG TERM GOAL #1   Title Independent with HEP    Baseline instructed at eval, will add/update as appropriate    Time 6    Period Weeks    Status New    Target Date 07/05/20      PT LONG TERM GOAL #2   Title Improve FOTO outcome measure score to 60% or greater function    Baseline 55%    Time 6    Period Weeks    Status New    Target  Date 07/05/20      PT LONG TERM GOAL #3   Title Tolerate standing/walking for activities including grocery shopping, community mobility with LBP 3/10 or less    Baseline 7/10, has to lean on cart for comfort  Time 6    Period Weeks    Status New    Target Date 07/05/20      PT LONG TERM GOAL #4   Title Increase bilat. hip abduction strength to 4+/5 or greater to improve lumbopelvic stability and help improve walking tolerance    Baseline 4/5 bilat.    Time 6    Period Weeks    Status New    Target Date 07/05/20      PT LONG TERM GOAL #5   Title Pt. to be able to demo neutral lumbar spine posture for standing for chores with LBP 3/10 or less    Baseline underying postural kyphosis for thoracic region but difficulty maintaining neutral lumbar spine posture with tendency forward flexion    Time 6    Period Weeks    Status New    Target Date 07/05/20                 Plan - 06/15/20 1221    Clinical Impression Statement Mild improvement as noted in subjective with decreased LBP and functional gains for standing tolerance from baseline. Continued flexion bias ROM and strengthening progession with good tolerance and also continued dry needling given benefit noted to help with pain. Therapy goals still ongoing with limitations for standing and walking tolerance but plan continue POC given now noting some improvement.    Personal Factors and Comorbidities Comorbidity 3+    Comorbidities AS, DDD, MI, CABG, neuropathy, see PMH    Examination-Activity Limitations Locomotion Level;Stand    Examination-Participation Restrictions Community Activity;Shop    Stability/Clinical Decision Making Evolving/Moderate complexity    Clinical Decision Making Moderate    Rehab Potential Good    PT Frequency 2x / week    PT Duration 6 weeks    PT Treatment/Interventions ADLs/Self Care Home Management;Cryotherapy;Software engineer;Therapeutic exercise;Patient/family  education;Manual techniques;Neuromuscular re-education;Balance training;Therapeutic activities;Functional mobility training;Dry needling;Taping    PT Next Visit Plan Reacheck FOTO next session, Ankylosing spondylitis with history L1 fx.-no spinal manipulation/spinal mobs and caution with trunk flexion, continue/progress flexion bias ROM and lumbar/core stabilization, add upper back/postural strengthening and pec stretch, further dry needling prn    PT Home Exercise Plan Access code: [email or web address]    Consulted and Agree with Plan of Care Patient           Patient will benefit from skilled therapeutic intervention in order to improve the following deficits and impairments:  Postural dysfunction,Impaired flexibility,Decreased strength,Decreased activity tolerance,Decreased range of motion,Difficulty walking,Impaired sensation  Visit Diagnosis: Chronic low back pain without sciatica, unspecified back pain laterality  Abnormal posture  Pain in thoracic spine  Chronic bilateral low back pain without sciatica     Problem List Patient Active Problem List   Diagnosis Date Noted  . Puncture wound of right foot 03/07/2020  . Alcohol use disorder, mild, abuse 10/14/2019  . Chronic bilateral low back pain without sciatica 07/13/2019  . Numbness and tingling in right hand 07/13/2019  . Fall at home, initial encounter 02/02/2019  . Leg cramping 12/22/2018  . Rash 05/23/2018  . Peripheral neuropathy 11/04/2017  . Gynecomastia, male 02/18/2017  . Healthcare maintenance 01/20/2017  . Aortic atherosclerosis ([place]) 12/03/2016  . History of cryptosporidiosis   . S/P CABG x 4 10/29/2013  . Hyperlipidemia 06/15/2008  . HTN, goal below 140/90 06/15/2008  . CAD (coronary artery disease) 06/15/2008  . SPONDYLITIS, ANKYLOSING 06/15/2008  . Atherosclerotic heart disease of native coronary artery without angina pectoris 06/15/2008  . Gastro-esophageal reflux disease  without esophagitis 06/15/2008     Beaulah Dinning, PT, DPT 06/15/20 12:33 PM  [facility] [address] [place], [place], [address] Phone: [phone]   Fax:  [phone]  Name: Gregory Hansen MRN: [id number] Date of Birth: 08/07/1950

## 2020-06-16 NOTE — Telephone Encounter (Addendum)
F/u on patient assistance application with [employer] regarding an approval for HUMIRA patient assistance from 06/09/20 to 03/10/21.  Requested approval letter be re-faxed to clinic  Patient scheduled for Humira new start on 06/19/20 @ 10:30a with pharmacy clinic. Advised to hold Enbrel dose today since we need to start the Humira when Enbrel would be due. Last Enbrel dose was 06/09/20. Patient verbalized understanding. He has already scheduled shipment of the Humira to his home and expected arrival date is 06/28/20  Phone number: [phone]  Knox Saliva, PharmD, MPH Clinical Pharmacist (Rheumatology and Pulmonology)

## 2020-06-19 ENCOUNTER — Ambulatory Visit: Payer: Medicare Other | Admitting: Pharmacist

## 2020-06-19 ENCOUNTER — Other Ambulatory Visit: Payer: Self-pay

## 2020-06-19 DIAGNOSIS — Z79899 Other long term (current) drug therapy: Secondary | ICD-10-CM

## 2020-06-19 DIAGNOSIS — M459 Ankylosing spondylitis of unspecified sites in spine: Secondary | ICD-10-CM

## 2020-06-19 MED ORDER — HUMIRA (2 PEN) 40 MG/0.4ML ~~LOC~~ AJKT: 40.0000 mg | AUTO-INJECTOR | SUBCUTANEOUS | 2 refills | Status: DC

## 2020-06-19 NOTE — Progress Notes (Signed)
Pharmacy Note  Subjective:   Patient presents to clinic today to receive first dose  of Humira.  His last Enbrel dose was on 06/09/20 and held his last dose with the anticipation of starting Humira today.  Patient running a fever or have signs/symptoms of infection? No  Patient currently on antibiotics for the treatment of infection? No  Patient have any upcoming invasive procedures/surgeries? No  Objective: CMP     Component Value Date/Time   NA 133 (L) 05/02/2020 0824   NA 136 10/14/2019 1221   K 4.4 05/02/2020 0824   CL 101 05/02/2020 0824   CO2 26 05/02/2020 0824   GLUCOSE 107 (H) 05/02/2020 0824   BUN 17 05/02/2020 0824   BUN 10 10/14/2019 1221   CREATININE 0.99 05/02/2020 0824   CALCIUM 8.9 05/02/2020 0824   PROT 8.2 (H) 05/02/2020 0824   PROT 7.0 02/25/2019 1646   ALBUMIN 3.4 (L) 02/25/2019 1646   AST 32 05/02/2020 0824   ALT 25 05/02/2020 0824   ALKPHOS 141 (H) 02/25/2019 1646   BILITOT 0.7 05/02/2020 0824   BILITOT 0.9 02/25/2019 1646   GFRNONAA 77 05/02/2020 0824   GFRAA 89 05/02/2020 0824    CBC    Component Value Date/Time   WBC 5.1 05/02/2020 0824   RBC 4.23 05/02/2020 0824   HGB 10.9 (L) 05/02/2020 0824   HGB 11.8 (L) 10/14/2019 1221   HCT 35.3 (L) 05/02/2020 0824   HCT 37.9 10/14/2019 1221   PLT 150 05/02/2020 0824   PLT 137 (L) 10/14/2019 1221   MCV 83.5 05/02/2020 0824   MCV 83 10/14/2019 1221   MCH 25.8 (L) 05/02/2020 0824   MCHC 30.9 (L) 05/02/2020 0824   RDW 16.6 (H) 05/02/2020 0824   RDW 17.3 (H) 10/14/2019 1221   LYMPHSABS 1,025 05/02/2020 0824   LYMPHSABS 1.2 10/14/2019 1221   MONOABS 0.8 02/08/2019 0502   EOSABS 260 05/02/2020 0824   EOSABS 0.3 10/14/2019 1221   BASOSABS 51 05/02/2020 0824   BASOSABS 0.1 10/14/2019 1221    Baseline Immunosuppressant Therapy Labs TB GOLD Quantiferon TB Gold Latest Ref Rng & Units 11/10/2019  Quantiferon TB Gold Plus NEGATIVE NEGATIVE   Hepatitis Panel Hepatitis Latest Ref Rng & Units 11/10/2019   Hep B Surface Ag NON-REACTI NON-REACTIVE  Hep B IgM NON-REACTI NON-REACTIVE  Hep C Ab NON REACTIVE -  Hep C Ab NON-REACTI NON-REACTIVE  Hep C Ab NON-REACTI NON-REACTIVE  Hep A IgM NON REACTIVE -   HIV Lab Results  Component Value Date   HIV NON-REACTIVE 11/10/2019   HIV Reactive (A) 01/26/2019   HIV Non Reactive 03/31/2018   HIV Non Reactive 12/03/2016   Immunoglobulins Immunoglobulin Electrophoresis Latest Ref Rng & Units 12/27/2019  IgA  70 - 320 mg/dL <5(L)  IgG 600 - 1,540 mg/dL 2,752(H)  IgM 50 - 300 mg/dL 150   SPEP Serum Protein Electrophoresis Latest Ref Rng & Units 05/02/2020  Total Protein 6.1 - 8.1 g/dL 8.2(H)  Albumin 3.8 - 4.8 g/dL -  Alpha-1 0.2 - 0.3 g/dL -  Alpha-2 0.5 - 0.9 g/dL -  Beta Globulin 0.4 - 0.6 g/dL -  Beta 2 0.2 - 0.5 g/dL -  Gamma Globulin 0.8 - 1.7 g/dL -   Chest x-ray: 12/03/16  Unchanged cardiomegaly without active cardiopulmonary disease.  Assessment/Plan:  Counseled patient that Humira is a TNF blocking agent.  Counseled patient on purpose, proper use, and adverse effects of Humira.  Reviewed the most common adverse effects including infections, headache, and  injection site reactions. Discussed that there is the possibility of an increased risk of malignancy but it is not well understood if this increased risk is due to the medication or the disease state.  Advised patient to get yearly dermatology exams due to risk of skin cancer. Counseled patient that Humira should be held prior to scheduled surgery.  Patient UTD on vaccines. Noted 2nd COVID19 booster shot that as received 06/06/20  Demonstrated proper injection technique with Humira demo device  Patient able to demonstrate proper injection technique using the teach back method.  Patient self injected in the right thigh with:  Sample Medication: Humira 40mg /0.76mL NDC: [address]-09 Lot: [id number] Expiration: 03/2021  Patient tolerated well.  Observed for 30 mins in office for adverse  reaction - no adverse reaction noted.   Patient will continue Humira 40mg  SQ every 14 days. Patient is to return in 1 month for labs. F/u with Dr. Estanislado Pandy scheduled 07/31/20.  Standing orders placed for CBC and CMP today.  Humira approved through [place] patient assistance program.  Prescription was already sent with PAP application, and patient's anticipated shipment to home is already scheduled for 06/28/20. His next Humira dose is due 07/03/20.  All questions encouraged and answered.  Instructed patient to call with any further questions or concerns.  Knox Saliva, PharmD, MPH Clinical Pharmacist (Rheumatology and Pulmonology)  06/19/2020 8:29 AM

## 2020-06-19 NOTE — Patient Instructions (Signed)
Your next Humira dose is due on 4/25, 5/9, and every 14 days thereafter  Your prescription will be shipped from [facility]. Their phone number is [phone].  Please call to schedule shipment and confirm address. They will mail medication to your home.  Labs are due in 1 month then every 3 months.  Remember the 5 C's:  COUNTER - leave on the counter at least 30 minutes but up to overnight to bring medication to room temperature. This may help prevent stinging  COLD - place something cold (like an ice gel pack or cold water bottle) on the injection site just before cleansing with alcohol. This may help reduce pain  CLARITIN - use Claritin (generic name is loratadine) for the first two weeks of treatment or the day of, the day before, and the day after injecting. This will help to minimize injection site reactions  CORTISONE CREAM - apply if injection site is irritated and itching  CALL ME - if injection site reaction is bigger than the size of your fist, looks infected, blisters, or if you develop hives

## 2020-06-20 ENCOUNTER — Encounter: Payer: Self-pay | Admitting: Physical Therapy

## 2020-06-20 ENCOUNTER — Other Ambulatory Visit: Payer: Self-pay

## 2020-06-20 ENCOUNTER — Ambulatory Visit: Payer: Medicare Other | Admitting: Physical Therapy

## 2020-06-20 DIAGNOSIS — R293 Abnormal posture: Secondary | ICD-10-CM | POA: Diagnosis not present

## 2020-06-20 DIAGNOSIS — G8929 Other chronic pain: Secondary | ICD-10-CM

## 2020-06-20 DIAGNOSIS — M545 Low back pain, unspecified: Secondary | ICD-10-CM | POA: Diagnosis not present

## 2020-06-20 DIAGNOSIS — M546 Pain in thoracic spine: Secondary | ICD-10-CM

## 2020-06-20 NOTE — Therapy (Signed)
[place], [place], [address] Phone: [phone]   Fax:  [phone]  Physical Therapy Treatment  Patient Details  Name: Gregory Hansen MRN: [id number] Date of Birth: 05/03/1950 Referring Provider (PT): Bo Merino, MD   Encounter Date: 06/20/2020   PT End of Session - 06/20/20 1150    Visit Number 6    Number of Visits 12    Date for PT Re-Evaluation 07/05/20    Authorization Type U[employer] MCR, progress note by visit 10, recheck FOTO visit 10    PT Start Time 1101    PT Stop Time 1148    PT Time Calculation (min) 47 min    Activity Tolerance Patient tolerated treatment well    Behavior During Therapy [facility] for tasks assessed/performed           Past Medical History:  Diagnosis Date  . Acute hepatitis 01/26/2019  . Acute kidney failure ([place]) 03/30/2018  . AKI (acute kidney injury) ([place])   . Anginal pain ([place])   . Anxiety   . Arthritis    SPINE   . Arthritis   . Cellulitis 03/30/2018  . Coronary artery disease    s/p multiple percutaneous interventions, PCI 202 for DMI and DES distal RCA and CFX-OM 2006  . Coronary artery disease   . Depression   . Depression, major, single episode, mild ([place]) 06/15/2008   Qualifier: Diagnosis of  By: Orville Govern CMA, [place]    . GERD (gastroesophageal reflux disease)   . Hyperlipidemia    mixed  . Hyperlipidemia   . Hypertension   . Major depressive disorder, single episode 06/15/2008   Overview:  Overview:  Qualifier: Diagnosis of  By: Ronne Binning   . MI (myocardial infarction) ([place])   . Myocardial infarction [facility])    ? 2006  . Peripheral neuropathy    calves and both feet  . Peripheral neuropathy    calves and both feet  . Rhabdomyolysis 02/02/2019  . Severe sepsis with acute organ dysfunction ([place]) 12/03/2016  . Spondylitis, ankylosing ([place])     Past Surgical History:  Procedure Laterality Date  . ACHILLES TENDON REPAIR    . CARDIAC CATHETERIZATION      stent RCA June3, 2002, Stent OM/PTCA circumflex August 13, 2000  . CORONARY ANGIOPLASTY WITH STENT PLACEMENT     first one in 2006; had another place 3 months ago (August 2013)  . CORONARY ANGIOPLASTY WITH STENT PLACEMENT  03/26/2013   RCA           DR COOPER  . CORONARY ANGIOPLASTY WITH STENT PLACEMENT    . CORONARY ARTERY BYPASS GRAFT N/A 10/29/2013   Procedure: CORONARY ARTERY BYPASS GRAFTING x 4 (LIMA-LAD, SVG-OM, SVG-PD-PL) ENDOSCOPIC VEIN HARVEST RIGHT THIGH;  Surgeon: Gaye Pollack, MD;  Location: [place] OR;  Service: Open Heart Surgery;  Laterality: N/A;  . CORONARY ARTERY BYPASS GRAFT    . FINGER SURGERY     5  TH     DIGIT RIGHT HAND  . FINGER SURGERY    . INTRAOPERATIVE TRANSESOPHAGEAL ECHOCARDIOGRAM N/A 10/29/2013   Procedure: INTRAOPERATIVE TRANSESOPHAGEAL ECHOCARDIOGRAM;  Surgeon: Gaye Pollack, MD;  Location: [facility] OR;  Service: Open Heart Surgery;  Laterality: N/A;  . KNEE ARTHROSCOPY W/ LASER  2003  . LEFT HEART CATHETERIZATION WITH CORONARY ANGIOGRAM N/A 10/01/2011   Procedure: LEFT HEART CATHETERIZATION WITH CORONARY ANGIOGRAM;  Surgeon: [name], MD;  Location: [facility] CATH LAB;  Service: Cardiovascular;  Laterality: N/A;  .  LEFT HEART CATHETERIZATION WITH CORONARY ANGIOGRAM N/A 03/26/2013   Procedure: LEFT HEART CATHETERIZATION WITH CORONARY ANGIOGRAM;  Surgeon: Blane Ohara, MD;  Location: [facility] C[facility] LAB;  Service: Cardiovascular;  Laterality: N/A;  . LEFT HEART CATHETERIZATION WITH CORONARY ANGIOGRAM N/A 10/25/2013   Procedure: LEFT HEART CATHETERIZATION WITH CORONARY ANGIOGRAM;  Surgeon: Blane Ohara, MD;  Location: [facility] CATH LAB;  Service: Cardiovascular;  Laterality: N/A;  . TONSILLECTOMY      There were no vitals filed for this visit.   Subjective Assessment - 06/20/20 1117    Subjective Pt. reports left-sided back/gluteal region pain resolved over the weekend but still having c/o his legs feeling weak limiting standing/walking tolerance.    Pertinent History  ankylosing spondylitis, MI, CABG, cellulitis, anxiety/depression, L1 fracture, rhabdomyolysis, neuropathy, see PMH for further details    Currently in Pain? No/denies              [facility] PT Assessment - 06/20/20 0001      Observation/Other Assessments   Focus on Therapeutic Outcomes (FOTO)  56% function                         [facility] Adult PT Treatment/Exercise - 06/20/20 0001      Lumbar Exercises: Stretches   Passive Hamstring Stretch Left;Right;3 reps;30 seconds    Double Knee to Chest Stretch Limitations 20 reps with legs on 55 cm P-ball    Piriformis Stretch Left;3 reps;30 seconds      Lumbar Exercises: Aerobic   Nustep L6 x 5 min UE/LE      Lumbar Exercises: Standing   Row Limitations Freemotion bilat. cable row 13 lbs. 2x10    Shoulder Extension Limitations Freemotion cable extension bilat. 10 lbs. 2x10      Lumbar Exercises: Supine   Dead Bug 20 reps    Bridge 20 reps            Trigger Point Dry Needling - 06/20/20 0001    Consent Given? Yes    Muscles Treated Back/Hip Gluteus maximus;Erector spinae    Dry Needling Comments needling to left glut max and erector spinae at L4-5 region on left with 30 gauge 50 mm needles    Electrical Stimulation Performed with Dry Needling Yes    E-stim with Dry Needling Details TENS 2 pps x 10 minutes                     PT Long Term Goals - 06/20/20 1119      PT LONG TERM GOAL #1   Title Independent with HEP    Baseline instructed at eval, will add/update as appropriate    Time 6    Period Weeks    Status On-going      PT LONG TERM GOAL #2   Title Improve FOTO outcome measure score to 60% or greater function    Baseline 56% function    Time 6    Period Weeks    Status On-going      PT LONG TERM GOAL #3   Title Tolerate standing/walking for activities including grocery shopping, community mobility with LBP 3/10 or less    Baseline ongoing    Time 6    Period Weeks    Status On-going       PT LONG TERM GOAL #4   Title Increase bilat. hip abduction strength to 4+/5 or greater to improve lumbopelvic stability and help improve walking tolerance    Baseline  4/5 bilat.    Time 6    Period Weeks    Status On-going      PT LONG TERM GOAL #5   Title Pt. to be able to demo neutral lumbar spine posture for standing for chores with LBP 3/10 or less    Baseline underying postural kyphosis for thoracic region but difficulty maintaining neutral lumbar spine posture with tendency forward flexion    Time 6    Period Weeks    Status On-going                 Plan - 06/20/20 1122    Clinical Impression Statement Improving status for localized left lumbar/posterolateral hip pain but still continues with symptoms of legs feeling weak with standing/walking potentially concerning for neurogenic claudication and with associated functional limitations as noted per FOTO status today. Given benefit noted for decreased pain plan continue tx. but as previously if leg symptoms persist recommend MD follow up for further assessment (sees MD 07/31/20 but if symptoms persisting plan inform sooner).    Personal Factors and Comorbidities Comorbidity 3+    Comorbidities AS, DDD, MI, CABG, neuropathy, see PMH    Examination-Activity Limitations Locomotion Level;Stand    Examination-Participation Restrictions Community Activity;Shop    Stability/Clinical Decision Making Evolving/Moderate complexity    Clinical Decision Making Moderate    Rehab Potential Good    PT Frequency 2x / week    PT Duration 6 weeks    PT Treatment/Interventions ADLs/Self Care Home Management;Cryotherapy;Software engineer;Therapeutic exercise;Patient/family education;Manual techniques;Neuromuscular re-education;Balance training;Therapeutic activities;Functional mobility training;Dry needling;Taping    PT Next Visit Plan Ankylosing spondylitis with history L1 fx.-no spinal manipulation/spinal mobs and  caution with trunk flexion, continue/progress flexion bias ROM and lumbar/core stabilization, add upper back/postural strengthening and pec stretch, further dry needling prn    PT Home Exercise Plan Access code: [email or web address]    Consulted and Agree with Plan of Care Patient           Patient will benefit from skilled therapeutic intervention in order to improve the following deficits and impairments:  Postural dysfunction,Impaired flexibility,Decreased strength,Decreased activity tolerance,Decreased range of motion,Difficulty walking,Impaired sensation  Visit Diagnosis: Chronic low back pain without sciatica, unspecified back pain laterality  Abnormal posture  Pain in thoracic spine     Problem List Patient Active Problem List   Diagnosis Date Noted  . Puncture wound of right foot 03/07/2020  . Alcohol use disorder, mild, abuse 10/14/2019  . Chronic bilateral low back pain without sciatica 07/13/2019  . Numbness and tingling in right hand 07/13/2019  . Fall at home, initial encounter 02/02/2019  . Leg cramping 12/22/2018  . Rash 05/23/2018  . Peripheral neuropathy 11/04/2017  . Gynecomastia, male 02/18/2017  . Healthcare maintenance 01/20/2017  . Aortic atherosclerosis ([place]) 12/03/2016  . History of cryptosporidiosis   . S/P CABG x 4 10/29/2013  . Hyperlipidemia 06/15/2008  . HTN, goal below 140/90 06/15/2008  . CAD (coronary artery disease) 06/15/2008  . SPONDYLITIS, ANKYLOSING 06/15/2008  . Atherosclerotic heart disease of native coronary artery without angina pectoris 06/15/2008  . Gastro-esophageal reflux disease without esophagitis 06/15/2008    Beaulah Dinning, PT, DPT 06/20/20 11:51 AM  [facility] [address] [place], [place], [address] Phone: [phone]   Fax:  [phone]  Name: WALKER SITAR MRN: [id number] Date of Birth: 03-22-1950

## 2020-06-22 ENCOUNTER — Encounter: Payer: Self-pay | Admitting: Physical Therapy

## 2020-06-22 ENCOUNTER — Ambulatory Visit: Payer: Medicare Other | Admitting: Physical Therapy

## 2020-06-22 ENCOUNTER — Other Ambulatory Visit: Payer: Self-pay

## 2020-06-22 DIAGNOSIS — M546 Pain in thoracic spine: Secondary | ICD-10-CM

## 2020-06-22 DIAGNOSIS — G8929 Other chronic pain: Secondary | ICD-10-CM

## 2020-06-22 DIAGNOSIS — M545 Low back pain, unspecified: Secondary | ICD-10-CM

## 2020-06-22 DIAGNOSIS — R293 Abnormal posture: Secondary | ICD-10-CM | POA: Diagnosis not present

## 2020-06-22 NOTE — Therapy (Signed)
[place], [place], [address] Phone: [phone]   Fax:  [phone]  Physical Therapy Treatment  Patient Details  Name: Gregory Hansen MRN: [id number] Date of Birth: 1950/10/05 Referring Provider (PT): Bo Merino, MD   Encounter Date: 06/22/2020   PT End of Session - 06/22/20 1035    Visit Number 7    Number of Visits 12    Date for PT Re-Evaluation 07/05/20    Authorization Type UHC MCR, progress note by visit 10, recheck FOTO visit 10    PT Start Time 1018    PT Stop Time 1102    PT Time Calculation (min) 44 min    Activity Tolerance Patient tolerated treatment well    Behavior During Therapy [facility] for tasks assessed/performed           Past Medical History:  Diagnosis Date  . Acute hepatitis 01/26/2019  . Acute kidney failure ([place]) 03/30/2018  . AKI (acute kidney injury) ([place])   . Anginal pain ([place])   . Anxiety   . Arthritis    SPINE   . Arthritis   . Cellulitis 03/30/2018  . Coronary artery disease    s/p multiple percutaneous interventions, PCI 202 for DMI and DES distal RCA and CFX-OM 2006  . Coronary artery disease   . Depression   . Depression, major, single episode, mild ([place]) 06/15/2008   Qualifier: Diagnosis of  By: Orville Govern CMA, Carol    . GERD (gastroesophageal reflux disease)   . Hyperlipidemia    mixed  . Hyperlipidemia   . Hypertension   . Major depressive disorder, single episode 06/15/2008   Overview:  Overview:  Qualifier: Diagnosis of  By: Ronne Binning   . MI (myocardial infarction) ([place])   . Myocardial infarction [facility])    ? 2006  . Peripheral neuropathy    calves and both feet  . Peripheral neuropathy    calves and both feet  . Rhabdomyolysis 02/02/2019  . Severe sepsis with acute organ dysfunction ([place]) 12/03/2016  . Spondylitis, ankylosing ([place])     Past Surgical History:  Procedure Laterality Date  . ACHILLES TENDON REPAIR    . CARDIAC CATHETERIZATION      stent RCA June3, 2002, Stent OM/PTCA circumflex August 13, 2000  . CORONARY ANGIOPLASTY WITH STENT PLACEMENT     first one in 2006; had another place 3 months ago (August 2013)  . CORONARY ANGIOPLASTY WITH STENT PLACEMENT  03/26/2013   RCA           DR COOPER  . CORONARY ANGIOPLASTY WITH STENT PLACEMENT    . CORONARY ARTERY BYPASS GRAFT N/A 10/29/2013   Procedure: CORONARY ARTERY BYPASS GRAFTING x 4 (LIMA-LAD, SVG-OM, SVG-PD-PL) ENDOSCOPIC VEIN HARVEST RIGHT THIGH;  Surgeon: Gaye Pollack, MD;  Location: [facility] OR;  Service: Open Heart Surgery;  Laterality: N/A;  . CORONARY ARTERY BYPASS GRAFT    . FINGER SURGERY     5  TH     DIGIT RIGHT HAND  . FINGER SURGERY    . INTRAOPERATIVE TRANSESOPHAGEAL ECHOCARDIOGRAM N/A 10/29/2013   Procedure: INTRAOPERATIVE TRANSESOPHAGEAL ECHOCARDIOGRAM;  Surgeon: Gaye Pollack, MD;  Location: [facility] OR;  Service: Open Heart Surgery;  Laterality: N/A;  . KNEE ARTHROSCOPY W/ LASER  2003  . LEFT HEART CATHETERIZATION WITH CORONARY ANGIOGRAM N/A 10/01/2011   Procedure: LEFT HEART CATHETERIZATION WITH CORONARY ANGIOGRAM;  Surgeon: [name], MD;  Location: [facility] CATH LAB;  Service: Cardiovascular;  Laterality: N/A;  .  LEFT HEART CATHETERIZATION WITH CORONARY ANGIOGRAM N/A 03/26/2013   Procedure: LEFT HEART CATHETERIZATION WITH CORONARY ANGIOGRAM;  Surgeon: Blane Ohara, MD;  Location: [facility] CATH LAB;  Service: Cardiovascular;  Laterality: N/A;  . LEFT HEART CATHETERIZATION WITH CORONARY ANGIOGRAM N/A 10/25/2013   Procedure: LEFT HEART CATHETERIZATION WITH CORONARY ANGIOGRAM;  Surgeon: Blane Ohara, MD;  Location: [facility] CATH LAB;  Service: Cardiovascular;  Laterality: N/A;  . TONSILLECTOMY      There were no vitals filed for this visit.   Subjective Assessment - 06/22/20 1024    Subjective Status similar to last session with improvement of LBP/left gluteal pain but continued limitations of standing and walking tolerance. See assessment/plan.    Pertinent History  ankylosing spondylitis, MI, CABG, cellulitis, anxiety/depression, L1 fracture, rhabdomyolysis, neuropathy, see PMH for further details    Currently in Pain? No/denies                             [facility] Adult PT Treatment/Exercise - 06/22/20 0001      Lumbar Exercises: Stretches   Passive Hamstring Stretch Left;Right;3 reps;30 seconds    Double Knee to Chest Stretch Limitations 20 reps with legs on 55 cm P-ball    Lower Trunk Rotation Limitations x10 reps ea. way with legs on 55 cm P-ball    Piriformis Stretch Left;3 reps;30 seconds    Other Lumbar Stretch Exercise doorway pec stretch 3x20 sec      Lumbar Exercises: Aerobic   Nustep L6 x 5 min UE/LE      Lumbar Exercises: Standing   Row Limitations Freemotion bilat. cable extension 3x10 with 13 lbs.    Shoulder Extension Limitations Freemotion bilat. cable extension 2x10 with 10 lbs.      Lumbar Exercises: Supine   Dead Bug 20 reps    Bridge 20 reps    Other Supine Lumbar Exercises Theraband horizontal abduction 2x10 with blue band      Manual Therapy   Joint Mobilization grade I-II lower lumbar side glide in right sidelying)            Trigger Point Dry Needling - 06/22/20 0001    Consent Given? Yes    Education Handout Provided Previously provided    Muscles Treated Back/Hip Gluteus maximus;Erector spinae    Dry Needling Comments needling to left glut max and erector spinae at L4-5 region on left with 30 gauge 50 mm needles    Electrical Stimulation Performed with Dry Needling Yes    E-stim with Dry Needling Details TENS 2 pps x 10 minutes                     PT Long Term Goals - 06/20/20 1119      PT LONG TERM GOAL #1   Title Independent with HEP    Baseline instructed at eval, will add/update as appropriate    Time 6    Period Weeks    Status On-going      PT LONG TERM GOAL #2   Title Improve FOTO outcome measure score to 60% or greater function    Baseline 56% function    Time 6     Period Weeks    Status On-going      PT LONG TERM GOAL #3   Title Tolerate standing/walking for activities including grocery shopping, community mobility with LBP 3/10 or less    Baseline ongoing    Time 6    Period  Weeks    Status On-going      PT LONG TERM GOAL #4   Title Increase bilat. hip abduction strength to 4+/5 or greater to improve lumbopelvic stability and help improve walking tolerance    Baseline 4/5 bilat.    Time 6    Period Weeks    Status On-going      PT LONG TERM GOAL #5   Title Pt. to be able to demo neutral lumbar spine posture for standing for chores with LBP 3/10 or less    Baseline underying postural kyphosis for thoracic region but difficulty maintaining neutral lumbar spine posture with tendency forward flexion    Time 6    Period Weeks    Status On-going                 Plan - 06/22/20 1057    Clinical Impression Statement As previously pt. has noted in improvement with local lumbar and left gluteal pain but walking/stanidng tolerance still limited/potentially concerning for neurogenic claudication. Plan continue PT but MD informed via message of status and will await to see if further imaging needed prior to MD follow up next month.    Personal Factors and Comorbidities Comorbidity 3+    Comorbidities AS, DDD, MI, CABG, neuropathy, see PMH    Examination-Activity Limitations Locomotion Level;Stand    Examination-Participation Restrictions Community Activity;Shop    Stability/Clinical Decision Making Evolving/Moderate complexity    Clinical Decision Making Moderate    Rehab Potential Good    PT Frequency 2x / week    PT Duration 6 weeks    PT Treatment/Interventions ADLs/Self Care Home Management;Cryotherapy;Software engineer;Therapeutic exercise;Patient/family education;Manual techniques;Neuromuscular re-education;Balance training;Therapeutic activities;Functional mobility training;Dry needling;Taping    PT Next  Visit Plan Ankylosing spondylitis with history L1 fx.-no spinal manipulation/spinal mobs and caution with trunk flexion, continue/progress flexion bias ROM and lumbar/core stabilization, add upper back/postural strengthening and pec stretch, further dry needling prn    PT Home Exercise Plan Access code: [email or web address]    Consulted and Agree with Plan of Care Patient           Patient will benefit from skilled therapeutic intervention in order to improve the following deficits and impairments:  Postural dysfunction,Impaired flexibility,Decreased strength,Decreased activity tolerance,Decreased range of motion,Difficulty walking,Impaired sensation  Visit Diagnosis: Chronic low back pain without sciatica, unspecified back pain laterality  Abnormal posture  Pain in thoracic spine     Problem List Patient Active Problem List   Diagnosis Date Noted  . Puncture wound of right foot 03/07/2020  . Alcohol use disorder, mild, abuse 10/14/2019  . Chronic bilateral low back pain without sciatica 07/13/2019  . Numbness and tingling in right hand 07/13/2019  . Fall at home, initial encounter 02/02/2019  . Leg cramping 12/22/2018  . Rash 05/23/2018  . Peripheral neuropathy 11/04/2017  . Gynecomastia, male 02/18/2017  . Healthcare maintenance 01/20/2017  . Aortic atherosclerosis ([place]) 12/03/2016  . History of cryptosporidiosis   . S/P CABG x 4 10/29/2013  . Hyperlipidemia 06/15/2008  . HTN, goal below 140/90 06/15/2008  . CAD (coronary artery disease) 06/15/2008  . SPONDYLITIS, ANKYLOSING 06/15/2008  . Atherosclerotic heart disease of native coronary artery without angina pectoris 06/15/2008  . Gastro-esophageal reflux disease without esophagitis 06/15/2008   Beaulah Dinning, PT, DPT 06/22/20 11:00 AM  [place], [place], [address] Phone: [phone]   Fax:  [phone]  Name: Gregory Hansen MRN: [id number] Date  of Birth: [birth year]

## 2020-06-23 ENCOUNTER — Other Ambulatory Visit: Payer: Self-pay | Admitting: Pharmacist

## 2020-06-27 ENCOUNTER — Other Ambulatory Visit: Payer: Self-pay

## 2020-06-27 ENCOUNTER — Ambulatory Visit: Payer: Medicare Other | Admitting: Physical Therapy

## 2020-06-27 ENCOUNTER — Encounter: Payer: Self-pay | Admitting: Physical Therapy

## 2020-06-27 DIAGNOSIS — R293 Abnormal posture: Secondary | ICD-10-CM

## 2020-06-27 DIAGNOSIS — G8929 Other chronic pain: Secondary | ICD-10-CM | POA: Diagnosis not present

## 2020-06-27 DIAGNOSIS — M546 Pain in thoracic spine: Secondary | ICD-10-CM

## 2020-06-27 DIAGNOSIS — M545 Low back pain, unspecified: Secondary | ICD-10-CM

## 2020-06-27 NOTE — Therapy (Signed)
[place], [place], [address] Phone: [phone]   Fax:  [phone]  Physical Therapy Treatment  Patient Details  Name: Gregory Hansen MRN: [id number] Date of Birth: 23-Feb-1951 Referring Provider (PT): Bo Merino, MD   Encounter Date: 06/27/2020   PT End of Session - 06/27/20 1104    Visit Number 8    Number of Visits 12    Date for PT Re-Evaluation 07/05/20    Authorization Type UHC MCR, progress note by visit 10, recheck FOTO visit 10    PT Start Time 1103    PT Stop Time 1142    PT Time Calculation (min) 39 min    Activity Tolerance Patient tolerated treatment well    Behavior During Therapy [facility] for tasks assessed/performed           Past Medical History:  Diagnosis Date  . Acute hepatitis 01/26/2019  . Acute kidney failure ([place]) 03/30/2018  . AKI (acute kidney injury) ([place])   . Anginal pain ([place])   . Anxiety   . Arthritis    SPINE   . Arthritis   . Cellulitis 03/30/2018  . Coronary artery disease    s/p multiple percutaneous interventions, PCI 202 for DMI and DES distal RCA and CFX-OM 2006  . Coronary artery disease   . Depression   . Depression, major, single episode, mild ([place]) 06/15/2008   Qualifier: Diagnosis of  By: Orville Govern CMA, [place]    . GERD (gastroesophageal reflux disease)   . Hyperlipidemia    mixed  . Hyperlipidemia   . Hypertension   . Major depressive disorder, single episode 06/15/2008   Overview:  Overview:  Qualifier: Diagnosis of  By: Ronne Binning   . MI (myocardial infarction) ([place])   . Myocardial infarction [facility])    ? 2006  . Peripheral neuropathy    calves and both feet  . Peripheral neuropathy    calves and both feet  . Rhabdomyolysis 02/02/2019  . Severe sepsis with acute organ dysfunction ([place]) 12/03/2016  . Spondylitis, ankylosing ([place])     Past Surgical History:  Procedure Laterality Date  . ACHILLES TENDON REPAIR    . CARDIAC CATHETERIZATION      stent RCA June3, 2002, Stent OM/PTCA circumflex August 13, 2000  . CORONARY ANGIOPLASTY WITH STENT PLACEMENT     first one in 2006; had another place 3 months ago (August 2013)  . CORONARY ANGIOPLASTY WITH STENT PLACEMENT  03/26/2013   RCA           DR COOPER  . CORONARY ANGIOPLASTY WITH STENT PLACEMENT    . CORONARY ARTERY BYPASS GRAFT N/A 10/29/2013   Procedure: CORONARY ARTERY BYPASS GRAFTING x 4 (LIMA-LAD, SVG-OM, SVG-PD-PL) ENDOSCOPIC VEIN HARVEST RIGHT THIGH;  Surgeon: Gaye Pollack, MD;  Location: [place] OR;  Service: Open Heart Surgery;  Laterality: N/A;  . CORONARY ARTERY BYPASS GRAFT    . FINGER SURGERY     5  TH     DIGIT RIGHT HAND  . FINGER SURGERY    . INTRAOPERATIVE TRANSESOPHAGEAL ECHOCARDIOGRAM N/A 10/29/2013   Procedure: INTRAOPERATIVE TRANSESOPHAGEAL ECHOCARDIOGRAM;  Surgeon: Gaye Pollack, MD;  Location: [facility] OR;  Service: Open Heart Surgery;  Laterality: N/A;  . KNEE ARTHROSCOPY W/ LASER  2003  . LEFT HEART CATHETERIZATION WITH CORONARY ANGIOGRAM N/A 10/01/2011   Procedure: LEFT HEART CATHETERIZATION WITH CORONARY ANGIOGRAM;  Surgeon: [name], MD;  Location: [facility] CATH LAB;  Service: Cardiovascular;  Laterality: N/A;  .  LEFT HEART CATHETERIZATION WITH CORONARY ANGIOGRAM N/A 03/26/2013   Procedure: LEFT HEART CATHETERIZATION WITH CORONARY ANGIOGRAM;  Surgeon: Blane Ohara, MD;  Location: [facility] CATH LAB;  Service: Cardiovascular;  Laterality: N/A;  . LEFT HEART CATHETERIZATION WITH CORONARY ANGIOGRAM N/A 10/25/2013   Procedure: LEFT HEART CATHETERIZATION WITH CORONARY ANGIOGRAM;  Surgeon: Blane Ohara, MD;  Location: [facility] CATH LAB;  Service: Cardiovascular;  Laterality: N/A;  . TONSILLECTOMY      There were no vitals filed for this visit.   Subjective Assessment - 06/27/20 1106    Subjective " I am doing about the same. all the pain is in the lower back, but generally I am able to standing better."    Patient Stated Goals be able to stand up straighter and improve  walking ability    Currently in Pain? Yes    Pain Score 1    at worst 7-8/10             [facility] PT Assessment - 06/27/20 0001      Assessment   Medical Diagnosis Ankylosing spondylitis, thoracic DDD, arthropathy of lumbar facet    Referring Provider (PT) Bo Merino, MD                         [facility] Adult PT Treatment/Exercise - 06/27/20 0001      Therapeutic Activites    Therapeutic Activities Other Therapeutic Activities    Other Therapeutic Activities standing posture at the kitchen sink opening cabinet and placing foot in, x 6 min   74min RLE, 3 min LLE.     Lumbar Exercises: Stretches   Hip Flexor Stretch 2 reps;Right;Left;30 seconds   with added glute set     Lumbar Exercises: Aerobic   Recumbent Bike L5 x 7  min      Lumbar Exercises: Standing   Other Standing Lumbar Exercises palloff press 2 x 12 bil with 10#, bil press with UE on physioball 3 x 10 pressing holding 1 sec    Other Standing Lumbar Exercises standing hip extension 2 x going to fatigue alternating L/R with green theraband   bil HHA from freemotion                 PT Education - 06/27/20 1151    Education Details standing posture opening cabinets under sink placing foot in.    Person(s) Educated Patient    Methods Explanation;Verbal cues    Comprehension Verbalized understanding;Verbal cues required               PT Long Term Goals - 06/20/20 1119      PT LONG TERM GOAL #1   Title Independent with HEP    Baseline instructed at eval, will add/update as appropriate    Time 6    Period Weeks    Status On-going      PT LONG TERM GOAL #2   Title Improve FOTO outcome measure score to 60% or greater function    Baseline 56% function    Time 6    Period Weeks    Status On-going      PT LONG TERM GOAL #3   Title Tolerate standing/walking for activities including grocery shopping, community mobility with LBP 3/10 or less    Baseline ongoing    Time 6    Period  Weeks    Status On-going      PT LONG TERM GOAL #4   Title Increase bilat. hip abduction  strength to 4+/5 or greater to improve lumbopelvic stability and help improve walking tolerance    Baseline 4/5 bilat.    Time 6    Period Weeks    Status On-going      PT LONG TERM GOAL #5   Title Pt. to be able to demo neutral lumbar spine posture for standing for chores with LBP 3/10 or less    Baseline underying postural kyphosis for thoracic region but difficulty maintaining neutral lumbar spine posture with tendency forward flexion    Time 6    Period Weeks    Status On-going                 Plan - 06/27/20 1150    Clinical Impression Statement continued working on hip/ core strengthening in erect positiong which he did well with, but does requiring intermittent breaks due to fatigue/ soreness in the low back. reviewed standing psoture especially at the sink since he reported his biggest challenge is, and discussed opening cabinet and putting foot in.    PT Treatment/Interventions ADLs/Self Care Home Management;Cryotherapy;Software engineer;Therapeutic exercise;Patient/family education;Manual techniques;Neuromuscular re-education;Balance training;Therapeutic activities;Functional mobility training;Dry needling;Taping    PT Next Visit Plan Ankylosing spondylitis with history L1 fx.-no spinal manipulation/spinal mobs and caution with trunk flexion, continue/progress flexion bias ROM and lumbar/core stabilization, add upper back/postural strengthening and pec stretch, further dry needling prn    PT Home Exercise Plan Access code: [email or web address]    Consulted and Agree with Plan of Care Patient           Patient will benefit from skilled therapeutic intervention in order to improve the following deficits and impairments:  Postural dysfunction,Impaired flexibility,Decreased strength,Decreased activity tolerance,Decreased range of motion,Difficulty walking,Impaired  sensation  Visit Diagnosis: Chronic low back pain without sciatica, unspecified back pain laterality  Abnormal posture  Pain in thoracic spine  Chronic bilateral low back pain without sciatica     Problem List Patient Active Problem List   Diagnosis Date Noted  . Puncture wound of right foot 03/07/2020  . Alcohol use disorder, mild, abuse 10/14/2019  . Chronic bilateral low back pain without sciatica 07/13/2019  . Numbness and tingling in right hand 07/13/2019  . Fall at home, initial encounter 02/02/2019  . Leg cramping 12/22/2018  . Rash 05/23/2018  . Peripheral neuropathy 11/04/2017  . Gynecomastia, male 02/18/2017  . Healthcare maintenance 01/20/2017  . Aortic atherosclerosis ([place]) 12/03/2016  . History of cryptosporidiosis   . S/P CABG x 4 10/29/2013  . Hyperlipidemia 06/15/2008  . HTN, goal below 140/90 06/15/2008  . CAD (coronary artery disease) 06/15/2008  . SPONDYLITIS, ANKYLOSING 06/15/2008  . Atherosclerotic heart disease of native coronary artery without angina pectoris 06/15/2008  . Gastro-esophageal reflux disease without esophagitis 06/15/2008   Starr Lake PT, DPT, LAT, ATC  06/27/20  11:54 AM      [facility] [address] [place], [place], [address] Phone: [phone]   Fax:  [phone]  Name: Gregory Hansen MRN: [id number] Date of Birth: 1950/04/01

## 2020-06-30 ENCOUNTER — Ambulatory Visit: Payer: Medicare Other | Admitting: Physical Therapy

## 2020-07-03 ENCOUNTER — Encounter: Payer: Self-pay | Admitting: Rheumatology

## 2020-07-03 NOTE — Telephone Encounter (Signed)
Please advise the patient to hold humira until the infection has completely cleared.  If his symptoms persist or worsen he should be evaluated by his PCP prior to resuming humira.

## 2020-07-04 ENCOUNTER — Ambulatory Visit: Payer: Medicare Other | Admitting: Physical Therapy

## 2020-07-04 ENCOUNTER — Other Ambulatory Visit: Payer: Self-pay

## 2020-07-06 ENCOUNTER — Ambulatory Visit: Payer: Medicare Other | Admitting: Physical Therapy

## 2020-07-10 ENCOUNTER — Encounter: Payer: Self-pay | Admitting: Physical Therapy

## 2020-07-11 ENCOUNTER — Ambulatory Visit: Payer: Medicare Other | Attending: Rheumatology | Admitting: Physical Therapy

## 2020-07-11 ENCOUNTER — Encounter: Payer: Self-pay | Admitting: Physical Therapy

## 2020-07-11 ENCOUNTER — Other Ambulatory Visit: Payer: Self-pay

## 2020-07-11 DIAGNOSIS — M545 Low back pain, unspecified: Secondary | ICD-10-CM | POA: Insufficient documentation

## 2020-07-11 DIAGNOSIS — R293 Abnormal posture: Secondary | ICD-10-CM | POA: Insufficient documentation

## 2020-07-11 DIAGNOSIS — G8929 Other chronic pain: Secondary | ICD-10-CM | POA: Insufficient documentation

## 2020-07-11 DIAGNOSIS — M546 Pain in thoracic spine: Secondary | ICD-10-CM | POA: Insufficient documentation

## 2020-07-11 NOTE — Therapy (Signed)
[place], [place], [address] Phone: [phone]   Fax:  [phone]  Physical Therapy Treatment / Re-certification  Patient Details  Name: Gregory Hansen MRN: [id number] Date of Birth: 1950-03-15 Referring Provider (PT): Bo Merino, MD   Encounter Date: 07/11/2020   PT End of Session - 07/11/20 1058    Visit Number 9    Number of Visits 12    Date for PT Re-Evaluation 08/01/20    Authorization Type UHC MCR, progress note by visit 10, recheck FOTO visit 10    PT Start Time 1100    PT Stop Time 1144    PT Time Calculation (min) 44 min    Activity Tolerance Patient tolerated treatment well    Behavior During Therapy [facility] for tasks assessed/performed           Past Medical History:  Diagnosis Date  . Acute hepatitis 01/26/2019  . Acute kidney failure ([place]) 03/30/2018  . AKI (acute kidney injury) ([place])   . Anginal pain ([place])   . Anxiety   . Arthritis    SPINE   . Arthritis   . Cellulitis 03/30/2018  . Coronary artery disease    s/p multiple percutaneous interventions, PCI 202 for DMI and DES distal RCA and CFX-OM 2006  . Coronary artery disease   . Depression   . Depression, major, single episode, mild ([place]) 06/15/2008   Qualifier: Diagnosis of  By: Orville Govern CMA, Carol    . GERD (gastroesophageal reflux disease)   . Hyperlipidemia    mixed  . Hyperlipidemia   . Hypertension   . Major depressive disorder, single episode 06/15/2008   Overview:  Overview:  Qualifier: Diagnosis of  By: Ronne Binning   . MI (myocardial infarction) ([place])   . Myocardial infarction [facility])    ? 2006  . Peripheral neuropathy    calves and both feet  . Peripheral neuropathy    calves and both feet  . Rhabdomyolysis 02/02/2019  . Severe sepsis with acute organ dysfunction ([place]) 12/03/2016  . Spondylitis, ankylosing ([place])     Past Surgical History:  Procedure Laterality Date  . ACHILLES TENDON REPAIR    .  CARDIAC CATHETERIZATION     stent RCA June3, 2002, Stent OM/PTCA circumflex August 13, 2000  . CORONARY ANGIOPLASTY WITH STENT PLACEMENT     first one in 2006; had another place 3 months ago (August 2013)  . CORONARY ANGIOPLASTY WITH STENT PLACEMENT  03/26/2013   RCA           DR COOPER  . CORONARY ANGIOPLASTY WITH STENT PLACEMENT    . CORONARY ARTERY BYPASS GRAFT N/A 10/29/2013   Procedure: CORONARY ARTERY BYPASS GRAFTING x 4 (LIMA-LAD, SVG-OM, SVG-PD-PL) ENDOSCOPIC VEIN HARVEST RIGHT THIGH;  Surgeon: Gaye Pollack, MD;  Location: [place] OR;  Service: Open Heart Surgery;  Laterality: N/A;  . CORONARY ARTERY BYPASS GRAFT    . FINGER SURGERY     5  TH     DIGIT RIGHT HAND  . FINGER SURGERY    . INTRAOPERATIVE TRANSESOPHAGEAL ECHOCARDIOGRAM N/A 10/29/2013   Procedure: INTRAOPERATIVE TRANSESOPHAGEAL ECHOCARDIOGRAM;  Surgeon: Gaye Pollack, MD;  Location: [facility] OR;  Service: Open Heart Surgery;  Laterality: N/A;  . KNEE ARTHROSCOPY W/ LASER  2003  . LEFT HEART CATHETERIZATION WITH CORONARY ANGIOGRAM N/A 10/01/2011   Procedure: LEFT HEART CATHETERIZATION WITH CORONARY ANGIOGRAM;  Surgeon: [name], MD;  Location: [facility] CATH LAB;  Service: Cardiovascular;  Laterality:  N/A;  . LEFT HEART CATHETERIZATION WITH CORONARY ANGIOGRAM N/A 03/26/2013   Procedure: LEFT HEART CATHETERIZATION WITH CORONARY ANGIOGRAM;  Surgeon: Blane Ohara, MD;  Location: [facility] CATH LAB;  Service: Cardiovascular;  Laterality: N/A;  . LEFT HEART CATHETERIZATION WITH CORONARY ANGIOGRAM N/A 10/25/2013   Procedure: LEFT HEART CATHETERIZATION WITH CORONARY ANGIOGRAM;  Surgeon: Blane Ohara, MD;  Location: [facility] CATH LAB;  Service: Cardiovascular;  Laterality: N/A;  . TONSILLECTOMY      There were no vitals filed for this visit.   Subjective Assessment - 07/11/20 1101    Subjective "my lower back is at 1/10. with walking I am still feeling the familiar tiredness in my legs, and it can depend on when it will start."    Pain Score 1     at worst 8-9/10   Pain Location Back    Pain Orientation Lower    Aggravating Factors  standing doing the dishes    Pain Relieving Factors sit and rest              [facility] PT Assessment - 07/11/20 0001      Assessment   Medical Diagnosis Ankylosing spondylitis, thoracic DDD, arthropathy of lumbar facet    Referring Provider (PT) Bo Merino, MD                         [facility] Adult PT Treatment/Exercise - 07/11/20 0001      Lumbar Exercises: Aerobic   Recumbent Bike L5 x 5 min LE   Verb cues for abdominal set every other 30sec.     Lumbar Exercises: Seated   LAQ on Ball Weights (lbs) 4    LAQ on Ball Limitations 2  x 5 unweighted bil, 1 x 10 4# bil   seated on on dyna disc   Sit to Stand --   Hip hinge 2setsx10reps Cues for neutral posture, Second set with verb/tact cues for essentric control   Other Seated Lumbar Exercises anterior pelvic tilt 1 x 10 holding 3 sec; Marching with anterior pelvic tilt 1x10   seated on dyna disc   Other Seated Lumbar Exercises Pallof Press with Green Tband   Seated on dyna disc              Balance Exercises - 07/11/20 0001      Balance Exercises: Standing   Marching Solid surface;15 reps   tactile cues to keep back in neutral positiioning, intermittent CGA due to increased postural sway.                 PT Long Term Goals - 06/20/20 1119      PT LONG TERM GOAL #1   Title Independent with HEP    Baseline instructed at eval, will add/update as appropriate    Time 6    Period Weeks    Status On-going      PT LONG TERM GOAL #2   Title Improve FOTO outcome measure score to 60% or greater function    Baseline 56% function    Time 6    Period Weeks    Status On-going      PT LONG TERM GOAL #3   Title Tolerate standing/walking for activities including grocery shopping, community mobility with LBP 3/10 or less    Baseline ongoing    Time 6    Period Weeks    Status On-going      PT LONG TERM GOAL #4    Title  Increase bilat. hip abduction strength to 4+/5 or greater to improve lumbopelvic stability and help improve walking tolerance    Baseline 4/5 bilat.    Time 6    Period Weeks    Status On-going      PT LONG TERM GOAL #5   Title Pt. to be able to demo neutral lumbar spine posture for standing for chores with LBP 3/10 or less    Baseline underying postural kyphosis for thoracic region but difficulty maintaining neutral lumbar spine posture with tendency forward flexion    Time 6    Period Weeks    Status On-going                 Plan - 07/11/20 1146    Clinical Impression Statement Mrs Jani is making good progress with physical therapy improving low back pain postural control. However still reports intermittent soreness in the low back with feeling of fatigue in the legs with prlonged walking/ standing. focused session on core and hip strengtheing with emphasis on endurance training, as well as maintaining neutral spine which he required tactile cues for proper form intermittently throuhgout session. Plan to see him for 1 -2 more visit to update/ finalize HEP and addressing any remaning quesitons and if doing well then plan to discharge.    PT Frequency 1x / week    PT Duration 3 weeks    PT Treatment/Interventions ADLs/Self Care Home Management;Cryotherapy;Software engineer;Therapeutic exercise;Patient/family education;Manual techniques;Neuromuscular re-education;Balance training;Therapeutic activities;Functional mobility training;Dry needling;Taping    PT Next Visit Plan FOTO, review goals, HEP continue/progress flexion bias ROM and lumbar/core stabilization, add upper back/postural strengthening and pec stretch,    PT Home Exercise Plan Access code: [email or web address]    Consulted and Agree with Plan of Care Patient           Patient will benefit from skilled therapeutic intervention in order to improve the following deficits and impairments:  Postural  dysfunction,Impaired flexibility,Decreased strength,Decreased activity tolerance,Decreased range of motion,Difficulty walking,Impaired sensation  Visit Diagnosis: Chronic low back pain without sciatica, unspecified back pain laterality  Abnormal posture  Pain in thoracic spine  Chronic bilateral low back pain without sciatica     Problem List Patient Active Problem List   Diagnosis Date Noted  . Puncture wound of right foot 03/07/2020  . Alcohol use disorder, mild, abuse 10/14/2019  . Chronic bilateral low back pain without sciatica 07/13/2019  . Numbness and tingling in right hand 07/13/2019  . Fall at home, initial encounter 02/02/2019  . Leg cramping 12/22/2018  . Rash 05/23/2018  . Peripheral neuropathy 11/04/2017  . Gynecomastia, male 02/18/2017  . Healthcare maintenance 01/20/2017  . Aortic atherosclerosis ([place]) 12/03/2016  . History of cryptosporidiosis   . S/P CABG x 4 10/29/2013  . Hyperlipidemia 06/15/2008  . HTN, goal below 140/90 06/15/2008  . CAD (coronary artery disease) 06/15/2008  . SPONDYLITIS, ANKYLOSING 06/15/2008  . Atherosclerotic heart disease of native coronary artery without angina pectoris 06/15/2008  . Gastro-esophageal reflux disease without esophagitis 06/15/2008   Starr Lake PT, DPT, LAT, ATC  07/11/20  12:43 PM      [place] [facility] [address] [place], [place], [address] Phone: [phone]   Fax:  [phone]  Name: ASSER LUCENA MRN: [id number] Date of Birth: 1950/03/18

## 2020-07-13 ENCOUNTER — Other Ambulatory Visit: Payer: Self-pay

## 2020-07-13 ENCOUNTER — Encounter: Payer: Self-pay | Admitting: Physical Therapy

## 2020-07-13 ENCOUNTER — Ambulatory Visit: Payer: Medicare Other | Admitting: Physical Therapy

## 2020-07-13 DIAGNOSIS — R293 Abnormal posture: Secondary | ICD-10-CM | POA: Diagnosis not present

## 2020-07-13 DIAGNOSIS — M545 Low back pain, unspecified: Secondary | ICD-10-CM | POA: Diagnosis not present

## 2020-07-13 DIAGNOSIS — G8929 Other chronic pain: Secondary | ICD-10-CM

## 2020-07-13 DIAGNOSIS — M546 Pain in thoracic spine: Secondary | ICD-10-CM | POA: Diagnosis not present

## 2020-07-13 NOTE — Therapy (Signed)
[facility] [address] [place], [place], [address] Phone: [phone]   Fax:  [phone]  Physical Therapy Treatment/Discharge Summary   Patient Details  Name: Gregory Hansen MRN: [id number] Date of Birth: 12/23/1950 Referring Provider (PT): Shaili Deveshwar, MD   Encounter Date: 07/13/2020   PT End of Session - 07/13/20 1331    Visit Number 10    Number of Visits 12    Date for PT Re-Evaluation 08/01/20    PT Start Time 1335    PT Stop Time 1413    PT Time Calculation (min) 38 min    Activity Tolerance Patient tolerated treatment well    Behavior During Therapy WFL for tasks assessed/performed           Past Medical History:  Diagnosis Date  . Acute hepatitis 01/26/2019  . Acute kidney failure (HCC) 03/30/2018  . AKI (acute kidney injury) (HCC)   . Anginal pain (HCC)   . Anxiety   . Arthritis    SPINE   . Arthritis   . Cellulitis 03/30/2018  . Coronary artery disease    s/p multiple percutaneous interventions, PCI 202 for DMI and DES distal RCA and CFX-OM 2006  . Coronary artery disease   . Depression   . Depression, major, single episode, mild (HCC) 06/15/2008   Qualifier: Diagnosis of  By: Fiato, CMA, Carol    . GERD (gastroesophageal reflux disease)   . Hyperlipidemia    mixed  . Hyperlipidemia   . Hypertension   . Major depressive disorder, single episode 06/15/2008   Overview:  Overview:  Qualifier: Diagnosis of  By: Fiato, CMA, Carol   . MI (myocardial infarction) (HCC)   . Myocardial infarction (HCC)    ? 2006  . Peripheral neuropathy    calves and both feet  . Peripheral neuropathy    calves and both feet  . Rhabdomyolysis 02/02/2019  . Severe sepsis with acute organ dysfunction (HCC) 12/03/2016  . Spondylitis, ankylosing (HCC)     Past Surgical History:  Procedure Laterality Date  . ACHILLES TENDON REPAIR    . CARDIAC CATHETERIZATION     stent RCA June3, 2002, Stent OM/PTCA circumflex August 13, 2000  . CORONARY ANGIOPLASTY WITH STENT PLACEMENT     first one in 2006; had another place 3 months ago (August 2013)  . CORONARY ANGIOPLASTY WITH STENT PLACEMENT  03/26/2013   RCA           DR COOPER  . CORONARY ANGIOPLASTY WITH STENT PLACEMENT    . CORONARY ARTERY BYPASS GRAFT N/A 10/29/2013   Procedure: CORONARY ARTERY BYPASS GRAFTING x 4 (LIMA-LAD, SVG-OM, SVG-PD-PL) ENDOSCOPIC VEIN HARVEST RIGHT THIGH;  Surgeon: Bryan K Bartle, MD;  Location: [facility] OR;  Service: Open Heart Surgery;  Laterality: N/A;  . CORONARY ARTERY BYPASS GRAFT    . FINGER SURGERY     5  TH     DIGIT RIGHT HAND  . FINGER SURGERY    . INTRAOPERATIVE TRANSESOPHAGEAL ECHOCARDIOGRAM N/A 10/29/2013   Procedure: INTRAOPERATIVE TRANSESOPHAGEAL ECHOCARDIOGRAM;  Surgeon: Bryan K Bartle, MD;  Location: [facility] OR;  Service: Open Heart Surgery;  Laterality: N/A;  . KNEE ARTHROSCOPY W/ LASER  2003  . LEFT HEART CATHETERIZATION WITH CORONARY ANGIOGRAM N/A 10/01/2011   Procedure: LEFT HEART CATHETERIZATION WITH CORONARY ANGIOGRAM;  Surgeon: Michael Cooper, MD;  Location: [facility] CATH LAB;  Service: Cardiovascular;  Laterality: N/A;  . LEFT HEART CATHETERIZATION WITH CORONARY ANGIOGRAM N/A 03/26/2013   Procedure: LEFT HEART   CATHETERIZATION WITH CORONARY ANGIOGRAM;  Surgeon: Michael D Cooper, MD;  Location: [facility] CATH LAB;  Service: Cardiovascular;  Laterality: N/A;  . LEFT HEART CATHETERIZATION WITH CORONARY ANGIOGRAM N/A 10/25/2013   Procedure: LEFT HEART CATHETERIZATION WITH CORONARY ANGIOGRAM;  Surgeon: Michael D Cooper, MD;  Location: [facility] CATH LAB;  Service: Cardiovascular;  Laterality: N/A;  . TONSILLECTOMY      There were no vitals filed for this visit.   Subjective Assessment - 07/13/20 1332    Subjective Patient report he ordered his dynadisc that should arrive today, has been paying attention to his seated posture. Continues to experience increased LBP to as much as 7/10 with standing >5 min.    Pertinent History ankylosing spondylitis, MI,  CABG, cellulitis, anxiety/depression, L1 fracture, rhabdomyolysis, neuropathy, see PMH for further details    Limitations Standing;Walking;House hold activities    Currently in Pain? No/denies    Pain Score 0-No pain   Has been backing off on IBU lately.   Multiple Pain Sites No              [facility] PT Assessment - 07/13/20 1412      Assessment   Medical Diagnosis Ankylosing spondylitis, thoracic DDD, arthropathy of lumbar facet    Referring Provider (PT) Shaili Deveshwar, MD      Strength   Overall Strength Comments Hip ABD R=L=4+/5; Hip Ext R=L=4+/5                        [facility] Adult PT Treatment/Exercise - 07/13/20 0001      Lumbar Exercises: Standing   Other Standing Lumbar Exercises palloff press 2 x 10 bil with Green Tband, R/L discussed sitting on dynadisc at home pressing holding 1 sec      Knee/Hip Exercises: Standing   Hip Abduction 10 reps    Abduction Limitations R/L x2set with Green Tband    Hip Extension 10 reps    Extension Limitations R/L x2set with Green Tband      Knee/Hip Exercises: Seated   Sit to Sand 10 reps   Emphesize scap/glute sqeeze at stand     Shoulder Exercises: Stretch   Other Shoulder Stretches Doorway stretch x30sec                  PT Education - 07/13/20 1435    Education Details Reviewed and progressed HEP for standing hip strengthening and core stability exercises, POC to discharge with instructions for progressing independently.    Person(s) Educated Patient    Methods Explanation;Demonstration;Verbal cues    Comprehension Returned demonstration;Verbal cues required               PT Long Term Goals - 07/13/20 1339      PT LONG TERM GOAL #1   Title Independent with HEP    Status Achieved      PT LONG TERM GOAL #2   Title Improve FOTO outcome measure score to 60% or greater function    Status Partially Met      PT LONG TERM GOAL #3   Title Tolerate standing/walking for activities including grocery  shopping, community mobility with LBP 3/10 or less    Status Not Met      PT LONG TERM GOAL #4   Title Increase bilat. hip abduction strength to 4+/5 or greater to improve lumbopelvic stability and help improve walking tolerance    Status Achieved      PT LONG TERM GOAL #5   Title Pt.   to be able to demo neutral lumbar spine posture for standing for chores with LBP 3/10 or less    Status Achieved                 Plan - 07/13/20 1444    Clinical Impression Statement Patient has made good progress toward most therapy goals, has come close to predicted FOTO score.  Patient continues to c/o pain with standing that comes on in <5-10min, but feels he has the tools to correct with attention to posture, raising on foot onto step and engaging core.  Patient is in agreement that he has a good HEP and with the exercises added today is ready to perform independently and discharge from therapy.    PT Treatment/Interventions ADLs/Self Care Home Management;Cryotherapy;Electrical Stimulation;Moist Heat;Gait training;Therapeutic exercise;Patient/family education;Manual techniques;Neuromuscular re-education;Balance training;Therapeutic activities;Functional mobility training;Dry needling;Taping           Patient will benefit from skilled therapeutic intervention in order to improve the following deficits and impairments:  Postural dysfunction,Impaired flexibility,Decreased strength,Decreased activity tolerance,Decreased range of motion,Difficulty walking,Impaired sensation  Visit Diagnosis: Chronic low back pain without sciatica, unspecified back pain laterality  Abnormal posture  Chronic bilateral low back pain without sciatica     Problem List Patient Active Problem List   Diagnosis Date Noted  . Puncture wound of right foot 03/07/2020  . Alcohol use disorder, mild, abuse 10/14/2019  . Chronic bilateral low back pain without sciatica 07/13/2019  . Numbness and tingling in right hand  07/13/2019  . Fall at home, initial encounter 02/02/2019  . Leg cramping 12/22/2018  . Rash 05/23/2018  . Peripheral neuropathy 11/04/2017  . Gynecomastia, male 02/18/2017  . Healthcare maintenance 01/20/2017  . Aortic atherosclerosis (HCC) 12/03/2016  . History of cryptosporidiosis   . S/P CABG x 4 10/29/2013  . Hyperlipidemia 06/15/2008  . HTN, goal below 140/90 06/15/2008  . CAD (coronary artery disease) 06/15/2008  . SPONDYLITIS, ANKYLOSING 06/15/2008  . Atherosclerotic heart disease of native coronary artery without angina pectoris 06/15/2008  . Gastro-esophageal reflux disease without esophagitis 06/15/2008   PHYSICAL THERAPY DISCHARGE SUMMARY     , PT  07/13/2020, 3:28 PM  [facility] [address] [place], [place], [address] Phone: [phone]   Fax:  [phone]  Name: Gregory Hansen MRN: [id number] Date of Birth: 11/29/1950     Visits from Start of Care: 10  Current functional level related to goals / functional outcomes: Patient reports improvement in general strength and walking tolerance as well as no LBP at rest; however, reports continuation of standing tolerance, pain increases with 5-10 min of standing to 7/10.  He feels he now has the tools to address this with improved posture and core activation.    Remaining deficits: Decreased standing tolerance, decreased core stability.    Education / Equipment: Final HEP with handout and independent progression.  Energy conservation, spreading out exercises during the day and week.   Plan: Patient agrees to discharge.  Patient goals were partially met. Patient is being discharged due to being pleased with the current functional level.  ?????       

## 2020-07-13 NOTE — Patient Instructions (Signed)
Access Code: [email or web address] URL: [email or web address] Date: 07/13/2020 Prepared by: Starr Lake  Exercises Hooklying Single Knee to Chest Stretch - 2-3 x daily - 7 x weekly - 1 sets - 5 reps - 10 seconds hold Supine Posterior Pelvic Tilt - 1-2 x daily - 7 x weekly - 2 sets - 10 reps - 5 seconds hold Supine March with Posterior Pelvic Tilt - 1 x daily - 7 x weekly - 2 sets - 10 reps Hooklying Isometric Hip Flexion - 1 x daily - 7 x weekly - 1-2 sets - 10 reps - 5 seconds hold Supine Piriformis Stretch with Foot on Ground - 1-2 x daily - 7 x weekly - 1 sets - 3 reps - 30 seconds hold Sit to Stand - 1 x daily - 7 x weekly - 2 sets - 10 reps Doorway Pec Stretch at 90 Degrees Abduction - 2 x daily - 7 x weekly - 2 reps - 2 sets - 30 seconds hold Swiss Ball March - 1 x daily - 7 x weekly - 2 sets - 10 reps Swiss Ball March Arms Out - 1 x daily - 7 x weekly - 2 sets - 10 reps Squatting Anti-Rotation Press - 1 x daily - 7 x weekly - 2 sets - 10 reps Mini Squat with Counter Support - 1 x daily - 7 x weekly - 2 sets - 10 reps Swiss Ball March and Kick - 1 x daily - 7 x weekly - 2 sets - 10 reps Standing Hip Abduction with Resistance at Ankles and Counter Support - 1 x daily - 7 x weekly - 2 sets - 10 reps Standing Hip Extension with Resistance at Ankles and Counter Support - 1 x daily - 7 x weekly - 2 sets - 10 reps

## 2020-07-17 NOTE — Progress Notes (Signed)
Office Visit Note  Patient: Gregory Hansen             Date of Birth: 1950/05/18           MRN: [id number]             PCP: Rise Patience, DO Referring: Rise Patience, DO Visit Date: 07/31/2020 Occupation: @GUAROCC @  Subjective:  Medication monitoring.   History of Present Illness: Gregory Hansen is a 70 y.o. male with history of ankylosing spondylitis, osteoarthritis and degenerative disc disease.  He has been on Humira for almost 6 weeks now.  He skipped his last dose of Humira as he developed an upper respiratory tract infection.  He took his next dose today.  He states he still have some nasal congestion.  He has been taking ibuprofen due to underlying arthritis.  He continues to have discomfort in the thoracic and lumbar region when he walks.  He also has some weakness in his thigh muscles.  None of the joints are swollen.  The swelling in his right ankle joint is completely resolved.  He has had no recurrence of iritis.  Activities of Daily Living:  Patient reports morning stiffness for 0 minutes.   Patient Denies nocturnal pain.  Difficulty dressing/grooming: Denies Difficulty climbing stairs: Denies Difficulty getting out of chair: Denies Difficulty using hands for taps, buttons, cutlery, and/or writing: Denies  Review of Systems  Constitutional: Negative for fatigue and night sweats.  HENT: Positive for mouth dryness. Negative for mouth sores and nose dryness.   Eyes: Negative for pain, redness, itching, visual disturbance and dryness.  Respiratory: Positive for shortness of breath. Negative for cough, hemoptysis and difficulty breathing.   Cardiovascular: Negative for chest pain, palpitations, hypertension, irregular heartbeat and swelling in legs/feet.  Gastrointestinal: Negative for abdominal pain, blood in stool, constipation and diarrhea.  Endocrine: Negative for increased urination.  Genitourinary: Negative for painful urination.  Musculoskeletal: Negative for  arthralgias, joint pain, joint swelling, myalgias, muscle weakness, morning stiffness, muscle tenderness and myalgias.  Skin: Negative for color change, rash, hair loss, nodules/bumps, redness, skin tightness, ulcers and sensitivity to sunlight.  Allergic/Immunologic: Negative for susceptible to infections.  Neurological: Positive for dizziness, light-headedness and weakness. Negative for fainting, numbness, headaches, memory loss and night sweats.  Hematological: Negative for swollen glands.  Psychiatric/Behavioral: Negative for depressed mood, confusion and sleep disturbance. The patient is not nervous/anxious.     PMFS History:  Patient Active Problem List   Diagnosis Date Noted  . Puncture wound of right foot 03/07/2020  . Alcohol use disorder, mild, abuse 10/14/2019  . Chronic bilateral low back pain without sciatica 07/13/2019  . Numbness and tingling in right hand 07/13/2019  . Fall at home, initial encounter 02/02/2019  . Leg cramping 12/22/2018  . Rash 05/23/2018  . Peripheral neuropathy 11/04/2017  . Gynecomastia, male 02/18/2017  . Healthcare maintenance 01/20/2017  . Aortic atherosclerosis ([place]) 12/03/2016  . History of cryptosporidiosis   . S/P CABG x 4 10/29/2013  . Hyperlipidemia 06/15/2008  . HTN, goal below 140/90 06/15/2008  . CAD (coronary artery disease) 06/15/2008  . SPONDYLITIS, ANKYLOSING 06/15/2008  . Atherosclerotic heart disease of native coronary artery without angina pectoris 06/15/2008  . Gastro-esophageal reflux disease without esophagitis 06/15/2008    Past Medical History:  Diagnosis Date  . Acute hepatitis 01/26/2019  . Acute kidney failure ([place]) 03/30/2018  . AKI (acute kidney injury) ([place])   . Anginal pain ([place])   . Anxiety   . Arthritis  SPINE   . Arthritis   . Cellulitis 03/30/2018  . Coronary artery disease    s/p multiple percutaneous interventions, PCI 202 for DMI and DES distal RCA and CFX-OM 2006  . Coronary artery disease   .  Depression   . Depression, major, single episode, mild ([place]) 06/15/2008   Qualifier: Diagnosis of  By: Orville Govern CMA, Carol    . GERD (gastroesophageal reflux disease)   . Hyperlipidemia    mixed  . Hyperlipidemia   . Hypertension   . Major depressive disorder, single episode 06/15/2008   Overview:  Overview:  Qualifier: Diagnosis of  By: Ronne Binning   . MI (myocardial infarction) ([place])   . Myocardial infarction [facility])    ? 2006  . Peripheral neuropathy    calves and both feet  . Peripheral neuropathy    calves and both feet  . Rhabdomyolysis 02/02/2019  . Severe sepsis with acute organ dysfunction ([place]) 12/03/2016  . Spondylitis, ankylosing (HCC)     Family History  Problem Relation Age of Onset  . Breast cancer Mother 54  . Heart disease Father   . Heart disease Brother   . Leukemia Maternal Grandmother   . Heart disease Maternal Grandfather   . Heart disease Other        positive for cardiac disease and both brothers have had cardiac events  . Heart attack Brother   . Allergic rhinitis Neg Hx   . Angioedema Neg Hx   . Asthma Neg Hx   . Atopy Neg Hx   . Immunodeficiency Neg Hx   . Eczema Neg Hx   . Urticaria Neg Hx   . Colon cancer Neg Hx   . Colon polyps Neg Hx   . Esophageal cancer Neg Hx   . Rectal cancer Neg Hx   . Stomach cancer Neg Hx    Past Surgical History:  Procedure Laterality Date  . ACHILLES TENDON REPAIR    . CARDIAC CATHETERIZATION     stent RCA June3, 2002, Stent OM/PTCA circumflex August 13, 2000  . CORONARY ANGIOPLASTY WITH STENT PLACEMENT     first one in 2006; had another place 3 months ago (August 2013)  . CORONARY ANGIOPLASTY WITH STENT PLACEMENT  03/26/2013   RCA           DR COOPER  . CORONARY ANGIOPLASTY WITH STENT PLACEMENT    . CORONARY ARTERY BYPASS GRAFT N/A 10/29/2013   Procedure: CORONARY ARTERY BYPASS GRAFTING x 4 (LIMA-LAD, SVG-OM, SVG-PD-PL) ENDOSCOPIC VEIN HARVEST RIGHT THIGH;  Surgeon: Gaye Pollack, MD;  Location: [facility] OR;   Service: Open Heart Surgery;  Laterality: N/A;  . CORONARY ARTERY BYPASS GRAFT    . FINGER SURGERY     5  TH     DIGIT RIGHT HAND  . FINGER SURGERY    . INTRAOPERATIVE TRANSESOPHAGEAL ECHOCARDIOGRAM N/A 10/29/2013   Procedure: INTRAOPERATIVE TRANSESOPHAGEAL ECHOCARDIOGRAM;  Surgeon: Gaye Pollack, MD;  Location: [facility] OR;  Service: Open Heart Surgery;  Laterality: N/A;  . KNEE ARTHROSCOPY W/ LASER  2003  . LEFT HEART CATHETERIZATION WITH CORONARY ANGIOGRAM N/A 10/01/2011   Procedure: LEFT HEART CATHETERIZATION WITH CORONARY ANGIOGRAM;  Surgeon: [name], MD;  Location: [facility] CATH LAB;  Service: Cardiovascular;  Laterality: N/A;  . LEFT HEART CATHETERIZATION WITH CORONARY ANGIOGRAM N/A 03/26/2013   Procedure: LEFT HEART CATHETERIZATION WITH CORONARY ANGIOGRAM;  Surgeon: Blane Ohara, MD;  Location: [facility] CATH LAB;  Service: Cardiovascular;  Laterality: N/A;  . LEFT HEART CATHETERIZATION  WITH CORONARY ANGIOGRAM N/A 10/25/2013   Procedure: LEFT HEART CATHETERIZATION WITH CORONARY ANGIOGRAM;  Surgeon: Blane Ohara, MD;  Location: [facility] CATH LAB;  Service: Cardiovascular;  Laterality: N/A;  . TONSILLECTOMY     Social History   Social History Narrative   ** Merged History Encounter **       Lives in a one level home with son, and dog, Scientist, physiological. Works out of the house. 2 steps into house. Smoke alarms in house. No grab bars in bathroom. Has some area rugs and throw rugs with backing.      No pets of his own. Restores antique pens, does like to walk and cycle but having problems with peripheral neuropathy.       Firearm is safely stored.      Wears seat belts in vehicle. Does use sunblock and tries to avoid sun due to vitiligo and previous sunburns.      Eats varied diet. Likes protein, fresh fruits and veggies. Watches fats, saturated fats. Drink a lot of water.    Immunization History  Administered Date(s) Administered  . Influenza, High Dose Seasonal PF 11/23/2016  . Influenza,inj,Quad PF,6+  Mos 12/18/2017, 11/20/2018  . Influenza-Unspecified 12/02/2019  . PFIZER(Purple Top)SARS-COV-2 Vaccination 05/03/2019, 05/24/2019, 12/07/2019, 06/06/2020  . Pneumococcal Conjugate-13 01/20/2017  . Pneumococcal Polysaccharide-23 07/01/2015, 04/01/2018  . Tdap 06/30/2015  . Zoster Recombinat (Shingrix) 06/28/2019, 09/13/2019     Objective: Vital Signs: BP 137/84 (BP Location: Left Arm, Patient Position: Sitting, Cuff Size: Normal)   Pulse 68   Ht 6\' 2"  (1.88 m)   Wt 260 lb (117.9 kg)   BMI 33.38 kg/m    Physical Exam Vitals and nursing note reviewed.  Constitutional:      Appearance: He is well-developed.  HENT:     Head: Normocephalic and atraumatic.  Eyes:     Conjunctiva/sclera: Conjunctivae normal.     Pupils: Pupils are equal, round, and reactive to light.  Cardiovascular:     Rate and Rhythm: Normal rate and regular rhythm.     Heart sounds: Normal heart sounds.  Pulmonary:     Effort: Pulmonary effort is normal.     Breath sounds: Normal breath sounds.  Abdominal:     General: Bowel sounds are normal.     Palpations: Abdomen is soft.  Musculoskeletal:     Cervical back: Normal range of motion and neck supple.  Skin:    General: Skin is warm and dry.     Capillary Refill: Capillary refill takes less than 2 seconds.  Neurological:     Mental Status: He is alert and oriented to person, place, and time.  Psychiatric:        Behavior: Behavior normal.      Musculoskeletal Exam: Patient has some limitation range of motion of his cervical spine.  He has some thoracic kyphosis but no point tenderness.  There was no tenderness over lumbar region or SI joints.  Shoulder joints, elbow joints, wrist joints with good range of motion.  He had bilateral PIP and DIP thickening.  Hip joints and knee joints in good range of motion.  There was no warmth or swelling over ankle joints.  There was no tenderness over MTPs.  There was no evidence of Achilles tendinitis or plantar  fasciitis. CDAI Exam: CDAI Score: -- Patient Global: --; Provider Global: -- Swollen: 0 ; Tender: 0  Joint Exam 07/31/2020   No joint exam has been documented for this visit   There is currently no  information documented on the homunculus. Go to the Rheumatology activity and complete the homunculus joint exam.  Investigation: No additional findings.  Imaging: No results found.  Recent Labs: Lab Results  Component Value Date   WBC 3.6 (L) 07/24/2020   HGB 10.6 (L) 07/24/2020   PLT 154 07/24/2020   NA 133 (L) 07/24/2020   K 4.0 07/24/2020   CL 100 07/24/2020   CO2 23 07/24/2020   GLUCOSE 108 (H) 07/24/2020   BUN 12 07/24/2020   CREATININE 0.80 07/24/2020   BILITOT 0.4 07/24/2020   ALKPHOS 141 (H) 02/25/2019   AST 35 07/24/2020   ALT 26 07/24/2020   PROT 7.8 07/24/2020   ALBUMIN 3.4 (L) 02/25/2019   CALCIUM 9.0 07/24/2020   GFRAA 105 07/24/2020   QFTBGOLDPLUS NEGATIVE 11/10/2019    Speciality Comments: Enbrel started December 09, 2019  Procedures:  No procedures performed Allergies: Patient has no known allergies.   Assessment / Plan:     Visit Diagnoses: Ankylosing spondylitis, unspecified site of spine (HCC)-patient has been on Humira for 6 weeks now.  He skipped his last dose of Humira as he developed upper respiratory tract infection.  He took the dose of Humira today.  He still have some nasal congestion.  He denies any fever.  He states if he stops taking ibuprofen he starts having increased pain and discomfort.  Although he has not noticed any swelling in his joints since he has been on Humira.  The right ankle joint swelling has resolved.  High risk medication use - Humira 40mg  every 14 days. (previously on Enbrel 50 mg subcu weekly started December 09, 2019).  His labs are stable.  No neutropenia was noted.  He was advised to have repeat CBC in 1 month.  Otherwise he will have labs every 3 months to monitor for drug toxicity.  TB gold was negative on  November 10, 2019.  He has not seen his PCP in a long time.  Have advised her to schedule an appointment with his PCP.  He states his immunizations are up-to-date.  He has not seen a dermatologist in a while.  I advised him to schedule an appointment with a dermatologist for annual skin examination to screen for nonmelanoma skin cancer.  Chronic SI joint pain-he currently does not have any discomfort.  History of iritis - No recurrence in the last 20 years  DDD (degenerative disc disease), thoracic-he has discomfort off and on.  Arthropathy of lumbar facet joint-he gives history of chronic discomfort.  Primary osteoarthritis of both knees - Bilateral severe osteoarthritis and severe chondromalacia patella.  He denies any discomfort in his knee joints today.  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  No interventions are needed.  Medical problems are listed as follows:  History of vertebral fracture - CT lumbar 01/26/19: acute predominantly horizontally oriented fracture of L1 vertebral body.  Due to injury  Peripheral polyneuropathy  Coronary artery disease involving native coronary artery of native heart without angina pectoris  Vitiligo  S/P CABG x 4  Gastro-esophageal reflux disease without esophagitis  History of cryptosporidiosis  Essential hypertension-increased risk of heart disease with autoimmune disease was discussed.  Dietary modifications and regular exercise was emphasized.  A handout was placed in the AVS.  History of depression  History of alcohol use disorder  Orders: No orders of the defined types were placed in this encounter.  No orders of the defined types were placed in this encounter.  Follow-Up Instructions: Return for Ankylosing spondylitis.   Bo Merino, MD  Note - This record has been created using Editor, commissioning.  Chart creation errors have been sought, but may not always  have been located. Such  creation errors do not reflect on  the standard of medical care.

## 2020-07-24 ENCOUNTER — Other Ambulatory Visit: Payer: Self-pay

## 2020-07-24 DIAGNOSIS — Z79899 Other long term (current) drug therapy: Secondary | ICD-10-CM

## 2020-07-25 ENCOUNTER — Other Ambulatory Visit: Payer: Self-pay

## 2020-07-25 DIAGNOSIS — Z79899 Other long term (current) drug therapy: Secondary | ICD-10-CM

## 2020-07-25 LAB — COMPLETE METABOLIC PANEL WITH GFR
AG Ratio: 0.9 (calc) — ABNORMAL LOW (ref 1.0–2.5)
ALT: 26 U/L (ref 9–46)
AST: 35 U/L (ref 10–35)
Albumin: 3.7 g/dL (ref 3.6–5.1)
Alkaline phosphatase (APISO): 59 U/L (ref 35–144)
BUN: 12 mg/dL (ref 7–25)
CO2: 23 mmol/L (ref 20–32)
Calcium: 9 mg/dL (ref 8.6–10.3)
Chloride: 100 mmol/L (ref 98–110)
Creat: 0.8 mg/dL (ref 0.70–1.18)
GFR, Est African American: 105 mL/min/{1.73_m2} (ref >=60)
GFR, Est Non African American: 91 mL/min/{1.73_m2} (ref >=60)
Globulin: 4.1 g/dL (calc) — ABNORMAL HIGH (ref 1.9–3.7)
Glucose, Bld: 108 mg/dL — ABNORMAL HIGH (ref 65–99)
Potassium: 4 mmol/L (ref 3.5–5.3)
Sodium: 133 mmol/L — ABNORMAL LOW (ref 135–146)
Total Bilirubin: 0.4 mg/dL (ref 0.2–1.2)
Total Protein: 7.8 g/dL (ref 6.1–8.1)

## 2020-07-25 LAB — CBC WITH DIFFERENTIAL/PLATELET
Absolute Monocytes: 418 cells/uL (ref 200–950)
Basophils Absolute: 40 cells/uL (ref 0–200)
Basophils Relative: 1.1 %
Eosinophils Absolute: 299 cells/uL (ref 15–500)
Eosinophils Relative: 8.3 %
HCT: 34.5 % — ABNORMAL LOW (ref 38.5–50.0)
Hemoglobin: 10.6 g/dL — ABNORMAL LOW (ref 13.2–17.1)
Lymphs Abs: 1246 cells/uL (ref 850–3900)
MCH: 24.7 pg — ABNORMAL LOW (ref 27.0–33.0)
MCHC: 30.7 g/dL — ABNORMAL LOW (ref 32.0–36.0)
MCV: 80.2 fL (ref 80.0–100.0)
MPV: 10.5 fL (ref 7.5–12.5)
Monocytes Relative: 11.6 %
Neutro Abs: 1598 cells/uL (ref 1500–7800)
Neutrophils Relative %: 44.4 %
Platelets: 154 10*3/uL (ref 140–400)
RBC: 4.3 10*6/uL (ref 4.20–5.80)
RDW: 16.8 % — ABNORMAL HIGH (ref 11.0–15.0)
Total Lymphocyte: 34.6 %
WBC: 3.6 10*3/uL — ABNORMAL LOW (ref 3.8–10.8)

## 2020-07-25 NOTE — Progress Notes (Signed)
Anemia persists.  White cell count is low most likely due to immunosuppressive therapy.  Recommend repeat CBC in 1 month.  CMP is a stable.

## 2020-07-31 ENCOUNTER — Other Ambulatory Visit: Payer: Self-pay

## 2020-07-31 ENCOUNTER — Encounter: Payer: Self-pay | Admitting: Rheumatology

## 2020-07-31 ENCOUNTER — Ambulatory Visit: Payer: Medicare Other | Admitting: Rheumatology

## 2020-07-31 VITALS — BP 137/84 | HR 68 | Ht 74.0 in | Wt 260.0 lb

## 2020-07-31 DIAGNOSIS — Z8669 Personal history of other diseases of the nervous system and sense organs: Secondary | ICD-10-CM | POA: Diagnosis not present

## 2020-07-31 DIAGNOSIS — M533 Sacrococcygeal disorders, not elsewhere classified: Secondary | ICD-10-CM

## 2020-07-31 DIAGNOSIS — Z951 Presence of aortocoronary bypass graft: Secondary | ICD-10-CM

## 2020-07-31 DIAGNOSIS — M5134 Other intervertebral disc degeneration, thoracic region: Secondary | ICD-10-CM | POA: Diagnosis not present

## 2020-07-31 DIAGNOSIS — Z79899 Other long term (current) drug therapy: Secondary | ICD-10-CM

## 2020-07-31 DIAGNOSIS — Z8781 Personal history of (healed) traumatic fracture: Secondary | ICD-10-CM

## 2020-07-31 DIAGNOSIS — Z87898 Personal history of other specified conditions: Secondary | ICD-10-CM

## 2020-07-31 DIAGNOSIS — D802 Selective deficiency of immunoglobulin A [IgA]: Secondary | ICD-10-CM | POA: Diagnosis not present

## 2020-07-31 DIAGNOSIS — R7989 Other specified abnormal findings of blood chemistry: Secondary | ICD-10-CM

## 2020-07-31 DIAGNOSIS — M459 Ankylosing spondylitis of unspecified sites in spine: Secondary | ICD-10-CM

## 2020-07-31 DIAGNOSIS — Z8659 Personal history of other mental and behavioral disorders: Secondary | ICD-10-CM

## 2020-07-31 DIAGNOSIS — M170 Bilateral primary osteoarthritis of knee: Secondary | ICD-10-CM | POA: Diagnosis not present

## 2020-07-31 DIAGNOSIS — I2510 Atherosclerotic heart disease of native coronary artery without angina pectoris: Secondary | ICD-10-CM

## 2020-07-31 DIAGNOSIS — K219 Gastro-esophageal reflux disease without esophagitis: Secondary | ICD-10-CM

## 2020-07-31 DIAGNOSIS — Z8619 Personal history of other infectious and parasitic diseases: Secondary | ICD-10-CM

## 2020-07-31 DIAGNOSIS — L80 Vitiligo: Secondary | ICD-10-CM | POA: Diagnosis not present

## 2020-07-31 DIAGNOSIS — M25471 Effusion, right ankle: Secondary | ICD-10-CM

## 2020-07-31 DIAGNOSIS — G629 Polyneuropathy, unspecified: Secondary | ICD-10-CM

## 2020-07-31 DIAGNOSIS — I10 Essential (primary) hypertension: Secondary | ICD-10-CM

## 2020-07-31 DIAGNOSIS — M47816 Spondylosis without myelopathy or radiculopathy, lumbar region: Secondary | ICD-10-CM | POA: Diagnosis not present

## 2020-07-31 DIAGNOSIS — G8929 Other chronic pain: Secondary | ICD-10-CM

## 2020-07-31 NOTE — Patient Instructions (Addendum)
Standing Labs We placed an order today for your standing lab work.   Please have your standing labs drawn in August and every 3 months  If possible, please have your labs drawn 2 weeks prior to your appointment so that the provider can discuss your results at your appointment.  Please note that you may see your imaging and lab results in [place] before we have reviewed them. We may be awaiting multiple results to interpret others before contacting you. Please allow our office up to 72 hours to thoroughly review all of the results before contacting the office for clarification of your results.  We have open lab daily: Monday through Thursday from 1:30-4:30 PM and Friday from 1:30-4:00 PM at the office of Dr. Bo Merino, [facility].   Please be advised, all patients with office appointments requiring lab work will take precedent over walk-in lab work.  If possible, please come for your lab work on Monday and Friday afternoons, as you may experience shorter wait times. The office is located at [address] [place], [place], [place] [address] No appointment is necessary.   Labs are drawn by [facility]. Please bring your co-pay at the time of your lab draw.  You may receive a bill from [facility] for your lab work.  If you wish to have your labs drawn at another location, please call the office 24 hours in advance to send orders.  If you have any questions regarding directions or hours of operation,  please call [phone].   As a reminder, please drink plenty of water prior to coming for your lab work. Thanks!   Heart Disease Prevention   Your inflammatory disease increases your risk of heart disease which includes heart attack, stroke, atrial fibrillation (irregular heartbeats), high blood pressure, heart failure and atherosclerosis (plaque in the arteries).  It is important to reduce your risk by:   . Keep blood pressure, cholesterol, and blood sugar at healthy levels    . Smoking Cessation   . Maintain a healthy weight  o BMI 20-25   . Eat a healthy diet  o Plenty of fresh fruit, vegetables, and whole grains  o Limit saturated fats, foods high in sodium, and added sugars  o DASH and Mediterranean diet   . Increase physical activity  o Recommend moderate physically activity for 150 minutes per week/ 30 minutes a day for five days a week These can be broken up into three separate ten-minute sessions during the day.   . Reduce Stress  . Meditation, slow breathing exercises, yoga, coloring books  . Dental visits twice a year   Please schedule skin exam to screen for nonmelanoma skin cancer.

## 2020-08-10 DIAGNOSIS — D1801 Hemangioma of skin and subcutaneous tissue: Secondary | ICD-10-CM | POA: Diagnosis not present

## 2020-08-10 DIAGNOSIS — L300 Nummular dermatitis: Secondary | ICD-10-CM | POA: Diagnosis not present

## 2020-08-10 DIAGNOSIS — L821 Other seborrheic keratosis: Secondary | ICD-10-CM | POA: Diagnosis not present

## 2020-08-10 DIAGNOSIS — L80 Vitiligo: Secondary | ICD-10-CM | POA: Diagnosis not present

## 2020-08-23 ENCOUNTER — Other Ambulatory Visit: Payer: Self-pay | Admitting: *Deleted

## 2020-08-23 ENCOUNTER — Telehealth: Payer: Self-pay | Admitting: Allergy

## 2020-08-23 DIAGNOSIS — Z79899 Other long term (current) drug therapy: Secondary | ICD-10-CM

## 2020-08-23 LAB — CBC WITH DIFFERENTIAL/PLATELET
Absolute Monocytes: 690 cells/uL (ref 200–950)
Basophils Absolute: 42 cells/uL (ref 0–200)
Basophils Relative: 0.7 %
Eosinophils Absolute: 228 cells/uL (ref 15–500)
Eosinophils Relative: 3.8 %
HCT: 33.8 % — ABNORMAL LOW (ref 38.5–50.0)
Hemoglobin: 10.2 g/dL — ABNORMAL LOW (ref 13.2–17.1)
Lymphs Abs: 1428 cells/uL (ref 850–3900)
MCH: 24.7 pg — ABNORMAL LOW (ref 27.0–33.0)
MCHC: 30.2 g/dL — ABNORMAL LOW (ref 32.0–36.0)
MCV: 81.8 fL (ref 80.0–100.0)
MPV: 10 fL (ref 7.5–12.5)
Monocytes Relative: 11.5 %
Neutro Abs: 3612 cells/uL (ref 1500–7800)
Neutrophils Relative %: 60.2 %
Platelets: 148 10*3/uL (ref 140–400)
RBC: 4.13 10*6/uL — ABNORMAL LOW (ref 4.20–5.80)
RDW: 17.3 % — ABNORMAL HIGH (ref 11.0–15.0)
Total Lymphocyte: 23.8 %
WBC: 6 10*3/uL (ref 3.8–10.8)

## 2020-08-23 MED ORDER — IPRATROPIUM BROMIDE 0.06 % NA SOLN: NASAL | 5 refills | Status: DC

## 2020-08-23 NOTE — Telephone Encounter (Signed)
Refill of atrovent sent in for pt to [facility]

## 2020-08-23 NOTE — Telephone Encounter (Signed)
Pt states wants a refill on Ipratropium and to be sent to [facility] [address] phone number to pharmacy [phone].

## 2020-08-24 NOTE — Progress Notes (Signed)
White count is normal.  Hemoglobin is low and stable.

## 2020-08-27 ENCOUNTER — Telehealth: Payer: Medicare Other | Admitting: Family

## 2020-08-27 ENCOUNTER — Encounter: Payer: Self-pay | Admitting: Family

## 2020-08-27 DIAGNOSIS — I959 Hypotension, unspecified: Secondary | ICD-10-CM

## 2020-08-27 NOTE — Progress Notes (Signed)
Virtual Visit Consent   Gregory Hansen, you are scheduled for a virtual visit with a [facility] provider today.     Just as with appointments in the office, your consent must be obtained to participate.  Your consent will be active for this visit and any virtual visit you may have with one of our providers in the next 365 days.     If you have a MyChart account, a copy of this consent can be sent to you electronically.  All virtual visits are billed to your insurance company just like a traditional visit in the office.    As this is a virtual visit, video technology does not allow for your provider to perform a traditional examination.  This may limit your provider's ability to fully assess your condition.  If your provider identifies any concerns that need to be evaluated in person or the need to arrange testing (such as labs, EKG, etc.), we will make arrangements to do so.     Although advances in technology are sophisticated, we cannot ensure that it will always work on either your end or our end.  If the connection with a video visit is poor, the visit may have to be switched to a telephone visit.  With either a video or telephone visit, we are not always able to ensure that we have a secure connection.     I need to obtain your verbal consent now.   Are you willing to proceed with your visit today?    Gregory Hansen has provided verbal consent on 08/27/2020 for a virtual visit (video or telephone).   Evelina Dun, FNP   Date: 08/27/2020 6:29 PM   Virtual Visit via Video Note   IEvelina Dun, connected [email or web address]@ ([id number], [birth year]) on 08/27/20 at  6:15 PM EDT by a video-enabled telemedicine application and verified that I am speaking with the correct person using two identifiers.  Location: Patient: Virtual Visit Location Patient: Home Provider: Virtual Visit Location Provider: Home   I discussed the limitations of evaluation and management by telemedicine and the  availability of in person appointments. The patient expressed understanding and agreed to proceed.    History of Present Illness: Gregory Hansen is a 70 y.o. who identifies as a male who was assigned male at birth, and is being seen today for hypotension. He reports he checked his BP yesterday and today and it has ranged from 83/47- 101/57. However, through the last week it has been 120's/70's.   He reports he has been dizzy today. Reports he is drinking lots of fluids. He reports he fell yesterday after he was bending over. He landed on carpet only caused a small bruise on his shoulder, but fell because he became dizzy.   He is taking losartan 100 mg, HCTZ 12.5 mg, and Coreg 12.5 mg.   HPI: HPI  Problems:  Patient Active Problem List   Diagnosis Date Noted   Puncture wound of right foot 03/07/2020   Alcohol use disorder, mild, abuse 10/14/2019   Chronic bilateral low back pain without sciatica 07/13/2019   Numbness and tingling in right hand 07/13/2019   Fall at home, initial encounter 02/02/2019   Leg cramping 12/22/2018   Rash 05/23/2018   Peripheral neuropathy 11/04/2017   Gynecomastia, male 02/18/2017   Healthcare maintenance 01/20/2017   Aortic atherosclerosis ([place]) 12/03/2016   History of cryptosporidiosis    S/P CABG x 4 10/29/2013   Hyperlipidemia 06/15/2008   HTN, goal below  140/90 06/15/2008   CAD (coronary artery disease) 06/15/2008   SPONDYLITIS, ANKYLOSING 06/15/2008   Atherosclerotic heart disease of native coronary artery without angina pectoris 06/15/2008   Gastro-esophageal reflux disease without esophagitis 06/15/2008    Allergies: No Known Allergies Medications:  Current Outpatient Medications:    Adalimumab (HUMIRA PEN) 40 MG/0.4ML PNKT, Inject 40 mg into the skin every 14 (fourteen) days., Disp: 2 each, Rfl: 2   aspirin 81 MG tablet, Take 81 mg by mouth every evening. , Disp: , Rfl:    buPROPion (WELLBUTRIN XL) 300 MG 24 hr tablet, Take 300 mg by mouth  daily., Disp: , Rfl:    carvedilol (COREG) 12.5 MG tablet, Take 1 tablet (12.5 mg total) by mouth 2 (two) times daily with a meal., Disp: 180 tablet, Rfl: 3   docusate sodium (COLACE) 100 MG capsule, Take 100 mg by mouth daily as needed for mild constipation., Disp: , Rfl:    Fexofenadine HCl (MUCINEX ALLERGY PO), Take 400 mg by mouth 2 (two) times daily as needed., Disp: , Rfl:    hydrochlorothiazide (MICROZIDE) 12.5 MG capsule, TAKE ONE CAPSULE BY MOUTH DAILY, Disp: 90 capsule, Rfl: 3   IBUPROFEN PO, Take by mouth., Disp: , Rfl:    ipratropium (ATROVENT) 0.06 % nasal spray, Sprays each nostril 1-2 time daily, Disp: 15 mL, Rfl: 5   losartan (COZAAR) 100 MG tablet, Take 1 tablet (100 mg total) by mouth at bedtime., Disp: 90 tablet, Rfl: 3   nitroGLYCERIN (NITROSTAT) 0.4 MG SL tablet, Place 0.4 mg under the tongue every 5 (five) minutes as needed for chest pain., Disp: , Rfl:    pantoprazole (PROTONIX) 40 MG tablet, Take 1 tablet (40 mg total) by mouth daily., Disp: 90 tablet, Rfl: 3   rosuvastatin (CRESTOR) 20 MG tablet, Take 1 tablet (20 mg total) by mouth daily., Disp: 90 tablet, Rfl: 3   sildenafil (REVATIO) 20 MG tablet, TAKE 2-5 TABLETS BY MOUTH AS NEEDED PRIOR TO SEXUAL ACTIVITY, Disp: 50 tablet, Rfl: 12   Vilazodone HCl (VIIBRYD PO), Take 20 mg by mouth daily. , Disp: , Rfl:    vitamin B-12 (CYANOCOBALAMIN) 1000 MCG tablet, Take 1,000 mcg by mouth daily., Disp: , Rfl:   Observations/Objective: Patient is well-developed, well-nourished in no acute distress.  Resting comfortably  at home.  Head is normocephalic, atraumatic.  No labored breathing.  Speech is clear and coherent with logical content.  Patient is alert and oriented at baseline.  No distress noted  Assessment and Plan: 1. Hypotension, unspecified hypotension type Hold Coreg, HCTZ and Losartan  Can take Coreg in AM if BP is >140/90. He has only been taking HCTZ as needed for fluid, but has been taking every day recently.  Hold this.  He will call his PCP in AM to discuss medication changes.   Follow Up Instructions: I discussed the assessment and treatment plan with the patient. The patient was provided an opportunity to ask questions and all were answered. The patient agreed with the plan and demonstrated an understanding of the instructions.  A copy of instructions were sent to the patient via MyChart.  The patient was advised to call back or seek an in-person evaluation if the symptoms worsen or if the condition fails to improve as anticipated.  Time:  I spent 14 minutes with the patient via telehealth technology discussing the above problems/concerns.    Evelina Dun, FNP

## 2020-09-04 ENCOUNTER — Encounter: Payer: Self-pay | Admitting: Family Medicine

## 2020-09-04 ENCOUNTER — Encounter: Payer: Self-pay | Admitting: Rheumatology

## 2020-09-20 ENCOUNTER — Other Ambulatory Visit: Payer: Self-pay | Admitting: *Deleted

## 2020-09-20 DIAGNOSIS — M459 Ankylosing spondylitis of unspecified sites in spine: Secondary | ICD-10-CM

## 2020-09-20 MED ORDER — HUMIRA (2 PEN) 40 MG/0.4ML ~~LOC~~ AJKT: 40.0000 mg | AUTO-INJECTOR | SUBCUTANEOUS | 2 refills | Status: DC

## 2020-09-20 NOTE — Telephone Encounter (Signed)
Next Visit: 10/31/2020  Last Visit: 07/31/2020  Last Fill: 06/19/2020  DX:Ankylosing spondylitis, unspecified site of spine  Current Dose per office note 07/31/2020: Humira 40mg  every 14 days  Labs: 08/23/2020, CBC w/diff White count is normal.  Hemoglobin is low and stable. 07/24/2020 CMP Anemia persists.  White cell count is low most likely due to immunosuppressivetherapy.  Recommend repeat CBC in 1 month.  CMP is a stable.  TB Gold: 11/10/2019 negative   Okay to refill Humira?

## 2020-10-17 NOTE — Progress Notes (Signed)
Office Visit Note  Patient: Gregory Hansen             Date of Birth: September 21, 1950           MRN: [id number]             PCP: Rise Patience, DO Referring: Rise Patience, DO Visit Date: 10/31/2020 Occupation: '@GUAROCC'$ @  Subjective:  Left sided lower back pain   History of Present Illness: Gregory Hansen is a 70 y.o. male with history of ankylosing spondylitis, iritis, and DDD.  He has not currently taking any immunosuppressive agents.  He discontinued Humira in June to experiencing fatigue and dizziness.  He states that he was also experiencing recurrent infections while on Humira.  He is apprehensive to take any immunosuppressive agents at this time.  He has been taking ibuprofen 800 mg at bedtime which she feels has been managing his symptoms.  He has not been waking up in the night with pain and has no morning stiffness when rising from bed.  He does experience pain throughout the day which is worse with activity.  He has been using a cane to assist with ambulation which helps with his left-sided lower back pain.  He has not experiencing any symptoms of radiculopathy at this time.  He denies any other joint pain or joint swelling at this time.  He has no Achilles tendinitis or plantar fasciitis.  He denies any iritis flares.  His last flare was about 15 years ago per patient.  He states that his left eye feels and "fuzzy" but he has not had any eye pain or photophobia.  He plans on scheduling an appointment with his ophthalmologist for further evaluation.    Activities of Daily Living:  Patient reports morning stiffness 0 minutes  Patient Denies nocturnal pain.  Difficulty dressing/grooming: Denies Difficulty climbing stairs: Denies Difficulty getting out of chair: Denies Difficulty using hands for taps, buttons, cutlery, and/or writing: Denies  Review of Systems  Constitutional:  Negative for fatigue and night sweats.  HENT:  Positive for mouth dryness. Negative for mouth sores and  nose dryness.   Eyes:  Positive for visual disturbance (Left eye blurry vision). Negative for redness and dryness.  Respiratory:  Negative for cough, hemoptysis, shortness of breath and difficulty breathing.   Cardiovascular:  Negative for chest pain, palpitations, hypertension, irregular heartbeat and swelling in legs/feet.  Gastrointestinal:  Positive for constipation. Negative for blood in stool and diarrhea.  Endocrine: Negative for increased urination.  Genitourinary:  Negative for painful urination.  Musculoskeletal:  Positive for joint pain and joint pain. Negative for joint swelling, myalgias, muscle weakness, morning stiffness, muscle tenderness and myalgias.  Skin:  Positive for rash. Negative for color change, hair loss, nodules/bumps, skin tightness, ulcers and sensitivity to sunlight.  Allergic/Immunologic: Negative for susceptible to infections.  Neurological:  Negative for dizziness, fainting, memory loss, night sweats and weakness.  Hematological:  Negative for swollen glands.  Psychiatric/Behavioral:  Negative for depressed mood and sleep disturbance. The patient is not nervous/anxious.    PMFS History:  Patient Active Problem List   Diagnosis Date Noted   Puncture wound of right foot 03/07/2020   Alcohol use disorder, mild, abuse 10/14/2019   Chronic bilateral low back pain without sciatica 07/13/2019   Numbness and tingling in right hand 07/13/2019   Fall at home, initial encounter 02/02/2019   Leg cramping 12/22/2018   Rash 05/23/2018   Peripheral neuropathy 11/04/2017   Gynecomastia, male 02/18/2017   Healthcare  maintenance 01/20/2017   Aortic atherosclerosis ([place]) 12/03/2016   History of cryptosporidiosis    S/P CABG x 4 10/29/2013   Hyperlipidemia 06/15/2008   HTN, goal below 140/90 06/15/2008   CAD (coronary artery disease) 06/15/2008   SPONDYLITIS, ANKYLOSING 06/15/2008   Atherosclerotic heart disease of native coronary artery without angina pectoris  06/15/2008   Gastro-esophageal reflux disease without esophagitis 06/15/2008    Past Medical History:  Diagnosis Date   Acute hepatitis 01/26/2019   Acute kidney failure ([place]) 03/30/2018   AKI (acute kidney injury) ([place])    Anginal pain ([place])    Anxiety    Arthritis    SPINE    Arthritis    Cellulitis 03/30/2018   Coronary artery disease    s/p multiple percutaneous interventions, PCI 202 for DMI and DES distal RCA and CFX-OM 2006   Coronary artery disease    Depression    Depression, major, single episode, mild ([place]) 06/15/2008   Qualifier: Diagnosis of  By: Orville Govern, CMA, Carol     GERD (gastroesophageal reflux disease)    Hyperlipidemia    mixed   Hyperlipidemia    Hypertension    Major depressive disorder, single episode 06/15/2008   Overview:  Overview:  Qualifier: Diagnosis of  By: Orville Govern, CMA, Carol    MI (myocardial infarction) ([place])    Myocardial infarction ([place])    ? 2006   Peripheral neuropathy    calves and both feet   Peripheral neuropathy    calves and both feet   Rhabdomyolysis 02/02/2019   Severe sepsis with acute organ dysfunction ([place]) 12/03/2016   Spondylitis, ankylosing ([place])     Family History  Problem Relation Age of Onset   Breast cancer Mother 36   Heart disease Father    Cancer Brother    Heart disease Brother    Heart attack Brother    Leukemia Maternal Grandmother    Heart disease Maternal Grandfather    Heart disease Other        positive for cardiac disease and both brothers have had cardiac events   Allergic rhinitis Neg Hx    Angioedema Neg Hx    Asthma Neg Hx    Atopy Neg Hx    Immunodeficiency Neg Hx    Eczema Neg Hx    Urticaria Neg Hx    Colon cancer Neg Hx    Colon polyps Neg Hx    Esophageal cancer Neg Hx    Rectal cancer Neg Hx    Stomach cancer Neg Hx    Past Surgical History:  Procedure Laterality Date   ACHILLES TENDON REPAIR     CARDIAC CATHETERIZATION     stent RCA June3, 2002, Stent OM/PTCA circumflex August 13, 2000    CORONARY ANGIOPLASTY WITH STENT PLACEMENT     first one in 2006; had another place 3 months ago (August 2013)   CORONARY ANGIOPLASTY WITH STENT PLACEMENT  03/26/2013   RCA           DR COOPER   CORONARY ANGIOPLASTY WITH STENT PLACEMENT     CORONARY ARTERY BYPASS GRAFT N/A 10/29/2013   Procedure: CORONARY ARTERY BYPASS GRAFTING x 4 (LIMA-LAD, SVG-OM, SVG-PD-PL) ENDOSCOPIC VEIN HARVEST RIGHT THIGH;  Surgeon: Gaye Pollack, MD;  Location: [facility] OR;  Service: Open Heart Surgery;  Laterality: N/A;   CORONARY ARTERY BYPASS GRAFT     FINGER SURGERY     5  TH     DIGIT RIGHT HAND   FINGER SURGERY  INTRAOPERATIVE TRANSESOPHAGEAL ECHOCARDIOGRAM N/A 10/29/2013   Procedure: INTRAOPERATIVE TRANSESOPHAGEAL ECHOCARDIOGRAM;  Surgeon: Gaye Pollack, MD;  Location: [facility];  Service: Open Heart Surgery;  Laterality: N/A;   KNEE ARTHROSCOPY W/ LASER  2003   LEFT HEART CATHETERIZATION WITH CORONARY ANGIOGRAM N/A 10/01/2011   Procedure: LEFT HEART CATHETERIZATION WITH CORONARY ANGIOGRAM;  Surgeon: [name], MD;  Location[facility] C[facility] LAB;  Service: Cardiovascular;  Laterality: N/A;   LEFT HEART CATHETERIZATION WITH CORONARY ANGIOGRAM N/A 03/26/2013   Procedure: LEFT HEART CATHETERIZATION WITH CORONARY ANGIOGRAM;  Surgeon: Blane Ohara, MD;  Location: [facility] CATH LAB;  Service: Cardiovascular;  Laterality: N/A;   LEFT HEART CATHETERIZATION WITH CORONARY ANGIOGRAM N/A 10/25/2013   Procedure: LEFT HEART CATHETERIZATION WITH CORONARY ANGIOGRAM;  Surgeon: Blane Ohara, MD;  Location: [facility] CATH LAB;  Service: Cardiovascular;  Laterality: N/A;   TONSILLECTOMY     Social History   Social History Narrative   ** Merged History Encounter **       Lives in a one level home with son, and dog, Scientist, physiological. Works out of the house. 2 steps into house. Smoke alarms in house. No grab bars in bathroom. Has some area rugs and throw rugs with backing.      No pets of his own. Restores antique pens, does like to walk and cycle but having  problems with peripheral neuropathy.       Firearm is safely stored.      Wears seat belts in vehicle. Does use sunblock and tries to avoid sun due to vitiligo and previous sunburns.      Eats varied diet. Likes protein, fresh fruits and veggies. Watches fats, saturated fats. Drink a lot of water.    Immunization History  Administered Date(s) Administered   Influenza, High Dose Seasonal PF 11/23/2016   Influenza,inj,Quad PF,6+ Mos 12/18/2017, 11/20/2018   Influenza-Unspecified 12/02/2019   PFIZER(Purple Top)SARS-COV-2 Vaccination 05/03/2019, 05/24/2019, 12/07/2019, 06/06/2020   Pneumococcal Conjugate-13 01/20/2017   Pneumococcal Polysaccharide-23 07/01/2015, 04/01/2018   Tdap 06/30/2015   Zoster Recombinat (Shingrix) 06/28/2019, 09/13/2019     Objective: Vital Signs: BP 127/84 (BP Location: Left Arm, Patient Position: Sitting, Cuff Size: Small)   Pulse 70   Resp 12   Ht '6\' 2"'$  (1.88 m)   Wt 261 lb (118.4 kg)   BMI 33.51 kg/m    Physical Exam Vitals and nursing note reviewed.  Constitutional:      Appearance: He is well-developed.  HENT:     Head: Normocephalic and atraumatic.  Eyes:     Conjunctiva/sclera: Conjunctivae normal.     Pupils: Pupils are equal, round, and reactive to light.  Pulmonary:     Effort: Pulmonary effort is normal.  Abdominal:     Palpations: Abdomen is soft.  Musculoskeletal:     Cervical back: Normal range of motion and neck supple.  Skin:    General: Skin is warm and dry.     Capillary Refill: Capillary refill takes less than 2 seconds.  Neurological:     Mental Status: He is alert and oriented to person, place, and time.  Psychiatric:        Behavior: Behavior normal.     Musculoskeletal Exam: C-spine, thoracic spine, and lumbar spine have limited ROM.  Discomfort with extension of the C-spine.  Thoracic kyphosis noted.  No midline spinal tenderness or SI joint tenderness to palpation.  Shoulder joints, elbow joints, wrist joints, MCPs,  PIPs, DIPs have good range of motion with no synovitis.  PIP and DIP  thickening consistent with osteoarthritis of both hands noted.  He was able to make a complete fist bilaterally.  Hip joints have good range of motion with no discomfort.  Knee joints have good range of motion with no warmth or effusion.  Ankle joints have good range of motion with no tenderness or joint swelling.  No evidence of Achilles tendinitis or plantar fasciitis.  CDAI Exam: CDAI Score: -- Patient Global: --; Provider Global: -- Swollen: --; Tender: -- Joint Exam 10/31/2020   No joint exam has been documented for this visit   There is currently no information documented on the homunculus. Go to the Rheumatology activity and complete the homunculus joint exam.  Investigation: No additional findings.  Imaging: No results found.  Recent Labs: Lab Results  Component Value Date   WBC 6.0 08/23/2020   HGB 10.2 (L) 08/23/2020   PLT 148 08/23/2020   NA 133 (L) 07/24/2020   K 4.0 07/24/2020   CL 100 07/24/2020   CO2 23 07/24/2020   GLUCOSE 108 (H) 07/24/2020   BUN 12 07/24/2020   CREATININE 0.80 07/24/2020   BILITOT 0.4 07/24/2020   ALKPHOS 141 (H) 02/25/2019   AST 35 07/24/2020   ALT 26 07/24/2020   PROT 7.8 07/24/2020   ALBUMIN 3.4 (L) 02/25/2019   CALCIUM 9.0 07/24/2020   GFRAA 105 07/24/2020   QFTBGOLDPLUS NEGATIVE 11/10/2019    Speciality Comments: Enbrel started December 09, 2019  Procedures:  No procedures performed Allergies: Patient has no known allergies.   Assessment / Plan:     Visit Diagnoses: Ankylosing spondylitis, unspecified site of spine ([place]) -He has no joint tenderness or synovitis on examination today.  He has not currently taking any immunosuppressive agents.  He has not had any signs or symptoms of a flare recently.  He has been taking ibuprofen 800 mg at bedtime which has been alleviating his lower back discomfort.  He has not had any nocturnal pain or morning stiffness  recently.  He has some left-sided lower back pain which is aggravated by physical activity.  He has been using a cane to assist with ambulation, which improves his posture and pain.  He did not have any SI joint tenderness or midline spinal tenderness on examination today.   His last dose of Humira was on 08/14/2020.  He discontinued Humira due to experiencing dizziness, falls, and fatigue.  He was also having difficulty getting over an upper respiratory tract infection which started in March 2022.  He was initially on Enbrel but had an inadequate response.  He is apprehensive to start on any other immunosuppressive agents at this time.  Different treatment options were discussed today in detail and all questions were addressed. He does not want to make any medication changes at this time.  He plans on continuing to take ibuprofen 600 to 800 mg at bedtime.  A sed rate and CRP will be updated today. CMP with GFR was also ordered due to his daily NSAID use. He was advised to notify us if he develops any new or worsening symptoms.  He was advised to monitor for increased nocturnal pain as well as morning stiffness.  He will follow-up in the office in 6 months or sooner if necessary. plan: Sedimentation rate, C-reactive protein  High risk medication use - He is not currently taking any immunosuppressive agents.  He has been taking ibuprofen 800 mg at bedtime for pain relief.  He was previously on Enbrel followed by Humira.  He discontinued Humira  in June 2022 due to experiencing fatigue and dizziness as well as recurrent infections.  He does not want to take any immunosuppressive agents at this time.  CBC and CMP updated on 07/24/20.  CBC updated 08/23/20.   He is due to update CBC and CMP.  Orders were released. - Plan: CBC with Differential/Platelet, COMPLETE METABOLIC PANEL WITH GFR He has no personal history or family history of IBD.  No new rashes. No signs of psoriasis at this time.  No iritis flares in over 15  years.   Chronic SI joint pain: He has persistent left SI joint pain and stiffness.  He has not been experiencing any nocturnal pain or morning stiffness but has increased discomfort with activity.  He has been using a cane to assist with ambulation which helps to alleviate some of his discomfort and helps to improve his posture.  He has been taking ibuprofen 800 mg at bedtime which alleviates his discomfort.  History of iritis - No recurrence in the last 15-20 years.  He is not experiencing any eye pain or photophobia at this time.  He has noticed some blurriness in the left eye and was advised to schedule appointment with his ophthalmologist for further evaluation.  DDD (degenerative disc disease), thoracic: Thoracic kyphosis noted.  No midline spinal tenderness.    Arthropathy of lumbar facet joint: He has no midline spinal tenderness at this time.  Limited range of motion noted on examination today.  Primary osteoarthritis of both knees - Bilateral severe osteoarthritis and severe chondromalacia patella.  He has good range of motion in both knee joints on examination today.  No warmth or effusion was noted.  He has been using a cane to assist with ambulation.  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  No interventions are needed.  History of vertebral fracture - CT lumbar 01/26/19: acute predominantly horizontally oriented fracture of L1 vertebral body.  Due to injury  Peripheral polyneuropathy  Coronary artery disease involving native coronary artery of native heart without angina pectoris  Vitiligo  Gastro-esophageal reflux disease without esophagitis  S/P CABG x 4  Essential hypertension  History of cryptosporidiosis  History of depression  History of alcohol use disorder  Orders: Orders Placed This Encounter  Procedures   Sedimentation rate   C-reactive protein   CBC with  Differential/Platelet   COMPLETE METABOLIC PANEL WITH GFR   No orders of the defined types were placed in this encounter.     Follow-Up Instructions: Return in about 6 months (around 05/03/2021) for Ankylosing Spondylitis.   Ofilia Neas, PA-C  Note - This record has been created using Dragon software.  Chart creation errors have been sought, but may not always  have been located. Such creation errors do not reflect on  the standard of medical care.

## 2020-10-27 MED ORDER — NITROGLYCERIN 0.4 MG SL SUBL: 0.4000 mg | SUBLINGUAL_TABLET | SUBLINGUAL | 1 refills | Status: DC | PRN

## 2020-10-27 NOTE — Telephone Encounter (Signed)
Refilled NTG per pt request.  Pt last seen in Jan 2021 by Richardson Dopp.  Gave enough to last until 04/11/21.  Put in my chart message to ensure he does not take NTG within 24 hours of sildenafil.

## 2020-10-31 ENCOUNTER — Other Ambulatory Visit: Payer: Self-pay

## 2020-10-31 ENCOUNTER — Ambulatory Visit: Payer: Medicare Other | Admitting: Physician Assistant

## 2020-10-31 ENCOUNTER — Encounter: Payer: Self-pay | Admitting: Physician Assistant

## 2020-10-31 VITALS — BP 127/84 | HR 70 | Resp 12 | Ht 74.0 in | Wt 261.0 lb

## 2020-10-31 DIAGNOSIS — Z951 Presence of aortocoronary bypass graft: Secondary | ICD-10-CM

## 2020-10-31 DIAGNOSIS — L80 Vitiligo: Secondary | ICD-10-CM

## 2020-10-31 DIAGNOSIS — Z87898 Personal history of other specified conditions: Secondary | ICD-10-CM

## 2020-10-31 DIAGNOSIS — Z8659 Personal history of other mental and behavioral disorders: Secondary | ICD-10-CM

## 2020-10-31 DIAGNOSIS — M533 Sacrococcygeal disorders, not elsewhere classified: Secondary | ICD-10-CM

## 2020-10-31 DIAGNOSIS — M170 Bilateral primary osteoarthritis of knee: Secondary | ICD-10-CM

## 2020-10-31 DIAGNOSIS — Z8669 Personal history of other diseases of the nervous system and sense organs: Secondary | ICD-10-CM | POA: Diagnosis not present

## 2020-10-31 DIAGNOSIS — I2510 Atherosclerotic heart disease of native coronary artery without angina pectoris: Secondary | ICD-10-CM

## 2020-10-31 DIAGNOSIS — M5134 Other intervertebral disc degeneration, thoracic region: Secondary | ICD-10-CM

## 2020-10-31 DIAGNOSIS — Z8781 Personal history of (healed) traumatic fracture: Secondary | ICD-10-CM

## 2020-10-31 DIAGNOSIS — K219 Gastro-esophageal reflux disease without esophagitis: Secondary | ICD-10-CM

## 2020-10-31 DIAGNOSIS — G629 Polyneuropathy, unspecified: Secondary | ICD-10-CM | POA: Diagnosis not present

## 2020-10-31 DIAGNOSIS — D802 Selective deficiency of immunoglobulin A [IgA]: Secondary | ICD-10-CM | POA: Diagnosis not present

## 2020-10-31 DIAGNOSIS — M47816 Spondylosis without myelopathy or radiculopathy, lumbar region: Secondary | ICD-10-CM | POA: Diagnosis not present

## 2020-10-31 DIAGNOSIS — M459 Ankylosing spondylitis of unspecified sites in spine: Secondary | ICD-10-CM

## 2020-10-31 DIAGNOSIS — G8929 Other chronic pain: Secondary | ICD-10-CM

## 2020-10-31 DIAGNOSIS — Z79899 Other long term (current) drug therapy: Secondary | ICD-10-CM

## 2020-10-31 DIAGNOSIS — Z8619 Personal history of other infectious and parasitic diseases: Secondary | ICD-10-CM

## 2020-10-31 DIAGNOSIS — I10 Essential (primary) hypertension: Secondary | ICD-10-CM

## 2020-11-01 LAB — COMPLETE METABOLIC PANEL WITH GFR
AG Ratio: 0.9 (calc) — ABNORMAL LOW (ref 1.0–2.5)
ALT: 28 U/L (ref 9–46)
AST: 33 U/L (ref 10–35)
Albumin: 3.7 g/dL (ref 3.6–5.1)
Alkaline phosphatase (APISO): 61 U/L (ref 35–144)
BUN: 16 mg/dL (ref 7–25)
CO2: 24 mmol/L (ref 20–32)
Calcium: 8.9 mg/dL (ref 8.6–10.3)
Chloride: 99 mmol/L (ref 98–110)
Creat: 1.04 mg/dL (ref 0.70–1.28)
Globulin: 3.9 g/dL (calc) — ABNORMAL HIGH (ref 1.9–3.7)
Glucose, Bld: 99 mg/dL (ref 65–99)
Potassium: 4 mmol/L (ref 3.5–5.3)
Sodium: 133 mmol/L — ABNORMAL LOW (ref 135–146)
Total Bilirubin: 0.6 mg/dL (ref 0.2–1.2)
Total Protein: 7.6 g/dL (ref 6.1–8.1)
eGFR: 77 mL/min/{1.73_m2} (ref >=60)

## 2020-11-01 LAB — CBC WITH DIFFERENTIAL/PLATELET
Absolute Monocytes: 620 cells/uL (ref 200–950)
Basophils Absolute: 50 cells/uL (ref 0–200)
Basophils Relative: 1 %
Eosinophils Absolute: 220 cells/uL (ref 15–500)
Eosinophils Relative: 4.4 %
HCT: 33.7 % — ABNORMAL LOW (ref 38.5–50.0)
Hemoglobin: 10.2 g/dL — ABNORMAL LOW (ref 13.2–17.1)
Lymphs Abs: 1305 cells/uL (ref 850–3900)
MCH: 24.4 pg — ABNORMAL LOW (ref 27.0–33.0)
MCHC: 30.3 g/dL — ABNORMAL LOW (ref 32.0–36.0)
MCV: 80.6 fL (ref 80.0–100.0)
MPV: 9.9 fL (ref 7.5–12.5)
Monocytes Relative: 12.4 %
Neutro Abs: 2805 cells/uL (ref 1500–7800)
Neutrophils Relative %: 56.1 %
Platelets: 154 10*3/uL (ref 140–400)
RBC: 4.18 10*6/uL — ABNORMAL LOW (ref 4.20–5.80)
RDW: 17.1 % — ABNORMAL HIGH (ref 11.0–15.0)
Total Lymphocyte: 26.1 %
WBC: 5 10*3/uL (ref 3.8–10.8)

## 2020-11-01 LAB — SEDIMENTATION RATE: Sed Rate: 22 mm/h — ABNORMAL HIGH (ref 0–20)

## 2020-11-01 LAB — C-REACTIVE PROTEIN: CRP: 2.5 mg/L (ref <8.0)

## 2020-11-01 NOTE — Progress Notes (Signed)
ESR is borderline elevated-22 but has improved.  CRP WNL.  Patient remains anemic-hgb and hct are stable. WBC count WNL.   CMP stable. Please forward lab work to PCP as requested by the patient.

## 2020-11-13 ENCOUNTER — Encounter (HOSPITAL_COMMUNITY): Payer: Self-pay | Admitting: *Deleted

## 2020-11-13 ENCOUNTER — Ambulatory Visit (HOSPITAL_COMMUNITY)
Admission: EM | Admit: 2020-11-13 | Discharge: 2020-11-13 | Disposition: A | Discharge status: Home / Self Care | Payer: Medicare Other | Attending: Student | Admitting: Student

## 2020-11-13 ENCOUNTER — Other Ambulatory Visit: Payer: Self-pay

## 2020-11-13 DIAGNOSIS — S81812A Laceration without foreign body, left lower leg, initial encounter: Secondary | ICD-10-CM | POA: Diagnosis not present

## 2020-11-13 MED ORDER — DOXYCYCLINE HYCLATE 100 MG PO CAPS: 100.0000 mg | ORAL_CAPSULE | Freq: Two times a day (BID) | ORAL | 0 refills | Status: AC

## 2020-11-13 NOTE — ED Triage Notes (Signed)
Abrasion to Lt lower anterior leg.

## 2020-11-13 NOTE — ED Provider Notes (Signed)
[place]    CSN: [id number] Arrival date & time: 11/13/20  1613      History   Chief Complaint Chief Complaint  Patient presents with   Abrasion    HPI Gregory Hansen is a 70 y.o. male presenting with laceration to left lower extremity sustained about 3 hours ago.  States that he was trying to reach something on a shelf at a department store, the stool slipped and gouged his anterior left shin.  Applied a tight bandage and the stop the bleeding, but then he couldn't get the bandage off and so he came to the urgent care.  He is not on anticoagulant coagulation, but does take a daily baby aspirin.  Denies sensation changes, though he does have neuropathy at baseline.  HPI  Past Medical History:  Diagnosis Date   Acute hepatitis 01/26/2019   Acute kidney failure ([place]) 03/30/2018   AKI (acute kidney injury) ([place])    Anginal pain ([place])    Anxiety    Arthritis    SPINE    Arthritis    Cellulitis 03/30/2018   Coronary artery disease    s/p multiple percutaneous interventions, PCI 202 for DMI and DES distal RCA and CFX-OM 2006   Coronary artery disease    Depression    Depression, major, single episode, mild ([place]) 06/15/2008   Qualifier: Diagnosis of  By: Orville Govern, CMA, Carol     GERD (gastroesophageal reflux disease)    Hyperlipidemia    mixed   Hyperlipidemia    Hypertension    Major depressive disorder, single episode 06/15/2008   Overview:  Overview:  Qualifier: Diagnosis of  By: Orville Govern, CMA, Carol    MI (myocardial infarction) ([place])    Myocardial infarction ([place])    ? 2006   Peripheral neuropathy    calves and both feet   Peripheral neuropathy    calves and both feet   Rhabdomyolysis 02/02/2019   Severe sepsis with acute organ dysfunction ([place]) 12/03/2016   Spondylitis, ankylosing ([place])     Patient Active Problem List   Diagnosis Date Noted   Puncture wound of right foot 03/07/2020   Alcohol use disorder, mild, abuse 10/14/2019   Chronic bilateral low back  pain without sciatica 07/13/2019   Numbness and tingling in right hand 07/13/2019   Fall at home, initial encounter 02/02/2019   Leg cramping 12/22/2018   Rash 05/23/2018   Peripheral neuropathy 11/04/2017   Gynecomastia, male 02/18/2017   Healthcare maintenance 01/20/2017   Aortic atherosclerosis ([place]) 12/03/2016   History of cryptosporidiosis    S/P CABG x 4 10/29/2013   Hyperlipidemia 06/15/2008   HTN, goal below 140/90 06/15/2008   CAD (coronary artery disease) 06/15/2008   SPONDYLITIS, ANKYLOSING 06/15/2008   Atherosclerotic heart disease of native coronary artery without angina pectoris 06/15/2008   Gastro-esophageal reflux disease without esophagitis 06/15/2008    Past Surgical History:  Procedure Laterality Date   ACHILLES TENDON REPAIR     CARDIAC CATHETERIZATION     stent RCA June3, 2002, Stent OM/PTCA circumflex August 13, 2000   CORONARY ANGIOPLASTY WITH STENT PLACEMENT     first one in 2006; had another place 3 months ago (August 2013)   CORONARY ANGIOPLASTY WITH STENT PLACEMENT  03/26/2013   RCA           DR COOPER   CORONARY ANGIOPLASTY WITH STENT PLACEMENT     CORONARY ARTERY BYPASS GRAFT N/A 10/29/2013   Procedure: CORONARY ARTERY BYPASS GRAFTING x 4 (LIMA-LAD,  SVG-OM, SVG-PD-PL) ENDOSCOPIC VEIN HARVEST RIGHT THIGH;  Surgeon: Gaye Pollack, MD;  Location: [facility];  Service: Open Heart Surgery;  Laterality: N/A;   CORONARY ARTERY BYPASS GRAFT     FINGER SURGERY     5  TH     DIGIT RIGHT HAND   FINGER SURGERY     INTRAOPERATIVE TRANSESOPHAGEAL ECHOCARDIOGRAM N/A 10/29/2013   Procedure: INTRAOPERATIVE TRANSESOPHAGEAL ECHOCARDIOGRAM;  Surgeon: Gaye Pollack, MD;  Location: [facility] OR;  Service: Open Heart Surgery;  Laterality: N/A;   KNEE ARTHROSCOPY W/ LASER  2003   LEFT HEART CATHETERIZATION WITH CORONARY ANGIOGRAM N/A 10/01/2011   Procedure: LEFT HEART CATHETERIZATION WITH CORONARY ANGIOGRAM;  Surgeon: [name], MD;  Location: [facility] CATH LAB;  Service: Cardiovascular;   Laterality: N/A;   LEFT HEART CATHETERIZATION WITH CORONARY ANGIOGRAM N/A 03/26/2013   Procedure: LEFT HEART CATHETERIZATION WITH CORONARY ANGIOGRAM;  Surgeon: Blane Ohara, MD;  Location: [facility] CATH LAB;  Service: Cardiovascular;  Laterality: N/A;   LEFT HEART CATHETERIZATION WITH CORONARY ANGIOGRAM N/A 10/25/2013   Procedure: LEFT HEART CATHETERIZATION WITH CORONARY ANGIOGRAM;  Surgeon: Blane Ohara, MD;  Location: [facility] CATH LAB;  Service: Cardiovascular;  Laterality: N/A;   TONSILLECTOMY         Home Medications    Prior to Admission medications   Medication Sig Start Date End Date Taking? Authorizing Provider  doxycycline (VIBRAMYCIN) 100 MG capsule Take 1 capsule (100 mg total) by mouth 2 (two) times daily for 7 days. 11/13/20 11/20/20 Yes Hazel Sams, PA-C  aspirin 81 MG tablet Take 81 mg by mouth every evening.     [provider]  buPROPion (WELLBUTRIN XL) 300 MG 24 hr tablet Take 300 mg by mouth daily.    [provider]  carvedilol (COREG) 12.5 MG tablet Take 1 tablet (12.5 mg total) by mouth 2 (two) times daily with a meal. 10/26/19   Leavy Cella, RPH-CPP  clobetasol cream (TEMOVATE) 0.05 % Apply topically 2 (two) times daily. 08/10/20   [provider]  hydrochlorothiazide (MICROZIDE) 12.5 MG capsule TAKE ONE CAPSULE BY MOUTH DAILY 06/27/20   Lilland, Alana, DO  IBUPROFEN PO Take by mouth.    [provider]  losartan (COZAAR) 100 MG tablet Take 1 tablet (100 mg total) by mouth at bedtime. 10/26/19   Leavy Cella, RPH-CPP  nitroGLYCERIN (NITROSTAT) 0.4 MG SL tablet Place 1 tablet (0.4 mg total) under the tongue every 5 (five) minutes as needed for chest pain. 10/27/20 04/11/21  Richardson Dopp T, PA-C  pantoprazole (PROTONIX) 40 MG tablet Take 1 tablet (40 mg total) by mouth daily. 05/30/20 05/25/21  [name], MD  Psyllium Husk POWD  09/08/20   [provider]  rosuvastatin (CRESTOR) 20 MG tablet Take 1 tablet (20 mg total) by mouth  daily. 02/14/20   Lilland, Alana, DO  sildenafil (REVATIO) 20 MG tablet TAKE 2-5 TABLETS BY MOUTH AS NEEDED PRIOR TO SEXUAL ACTIVITY 03/30/20   Richardson Dopp T, PA-C  Vilazodone HCl (VIIBRYD PO) Take 20 mg by mouth daily.     [provider]  vitamin B-12 (CYANOCOBALAMIN) 1000 MCG tablet Take 1,000 mcg by mouth daily.    [provider]    Family History Family History  Problem Relation Age of Onset   Breast cancer Mother 68   Heart disease Father    Cancer Brother    Heart disease Brother    Heart attack Brother    Leukemia Maternal Grandmother    Heart  disease Maternal Grandfather    Heart disease Other        positive for cardiac disease and both brothers have had cardiac events   Allergic rhinitis Neg Hx    Angioedema Neg Hx    Asthma Neg Hx    Atopy Neg Hx    Immunodeficiency Neg Hx    Eczema Neg Hx    Urticaria Neg Hx    Colon cancer Neg Hx    Colon polyps Neg Hx    Esophageal cancer Neg Hx    Rectal cancer Neg Hx    Stomach cancer Neg Hx     Social History Social History   Tobacco Use   Smoking status: Former    Packs/day: 2.00    Years: 10.00    Pack years: 20.00    Types: Cigarettes    Quit date: 06/30/1979    Years since quitting: 41.4   Smokeless tobacco: Never   Tobacco comments:    no plans to start back  Vaping Use   Vaping Use: Never used  Substance Use Topics   Alcohol use: Yes    Alcohol/week: 16.0 standard drinks    Types: 14 Glasses of wine, 2 Shots of liquor per week    Comment: 3-4 glasses of wine at dinner everyday.   Drug use: No     Allergies   Patient has no known allergies.   Review of Systems Review of Systems  Skin:  Positive for wound.  All other systems reviewed and are negative.   Physical Exam Triage Vital Signs ED Triage Vitals  Enc Vitals Group     BP 11/13/20 1714 (!) 147/76     Pulse Rate 11/13/20 1714 73     Resp 11/13/20 1714 18     Temp 11/13/20 1714 97.9 F (36.6 C)     Temp src --       SpO2 11/13/20 1714 99 %     Weight --      Height --      Head Circumference --      Peak Flow --      Pain Score 11/13/20 1717 0     Pain Loc --      Pain Edu? --      Excl. in [place]? --    No data found.  Updated Vital Signs BP (!) 147/76   Pulse 73   Temp 97.9 F (36.6 C)   Resp 18   SpO2 99%   Visual Acuity Right Eye Distance:   Left Eye Distance:   Bilateral Distance:    Right Eye Near:   Left Eye Near:    Bilateral Near:     Physical Exam Vitals reviewed.  Constitutional:      General: He is not in acute distress.    Appearance: Normal appearance. He is not ill-appearing or diaphoretic.  HENT:     Head: Normocephalic and atraumatic.  Cardiovascular:     Rate and Rhythm: Normal rate and regular rhythm.     Heart sounds: Normal heart sounds.  Pulmonary:     Effort: Pulmonary effort is normal.     Breath sounds: Normal breath sounds.  Skin:    General: Skin is warm.     Comments: L anterior shin- 2.5cm laceration, still actively bleeding. Sensation intact. DP 2+, cap refill <2 seconds. See image below.  Neurological:     General: No focal deficit present.     Mental Status: He is alert and oriented to  person, place, and time.  Psychiatric:        Mood and Affect: Mood normal.        Behavior: Behavior normal.        Thought Content: Thought content normal.        Judgment: Judgment normal.       UC Treatments / Results  Labs (all labs ordered are listed, but only abnormal results are displayed) Labs Reviewed - No data to display  EKG   Radiology No results found.  Procedures Laceration Repair  Date/Time: 11/13/2020 6:42 PM Performed by: Hazel Sams, PA-C Authorized by: Hazel Sams, PA-C   Consent:    Consent obtained:  Verbal   Consent given by:  Patient   Risks discussed:  Infection, need for additional repair, poor cosmetic result, pain and poor wound healing   Alternatives discussed:  No treatment Universal protocol:     Procedure explained and questions answered to patient or proxy's satisfaction: yes     Patient identity confirmed:  Verbally with patient Anesthesia:    Anesthesia method:  None Laceration details:    Location:  Leg   Leg location:  L lower leg   Length (cm):  2.5 Pre-procedure details:    Preparation:  Patient was prepped and draped in usual sterile fashion Treatment:    Area cleansed with:  Povidone-iodine and saline   Amount of cleaning:  Standard   Irrigation solution:  Sterile saline Skin repair:    Repair method:  Steri-Strips   Number of Steri-Strips:  3 Approximation:    Approximation:  Close Repair type:    Repair type:  Simple Post-procedure details:    Dressing:  Non-adherent dressing   Procedure completion:  Tolerated well, no immediate complications (including critical care time)  Medications Ordered in UC Medications - No data to display  Initial Impression / Assessment and Plan / UC Course  I have reviewed the triage vital signs and the nursing notes.  Pertinent labs & imaging results that were available during my care of the patient were reviewed by me and considered in my medical decision making (see chart for details).     This patient is a very pleasant 70 y.o. year old male presenting with laceration L anterior shin. Neurovascularly intact. 3 steri strips applied as above. He is on daily baby aspirin. Tdap UTD 2017. He is immunocompetent, but provided paper script for doxycycline which he can start if concern for infection. Wound care provided and discussed. Strict ED return precautions discussed. Patient verbalizes understanding and agreement.  .   Final Clinical Impressions(s) / UC Diagnoses   Final diagnoses:  Laceration of left lower extremity, initial encounter     Discharge Instructions      -Leave the steri strips on for 4-5 days. You can apply vaseline to loosen them when it's time to remove them. We're also happy to help you here.  [facility]  your wound with gentle soap and water 1-2 times daily.   Keep wrapped during the day or when you're doing something that could get it dirty (working, [employer], cooking, Social research officer, government). Avoid cleansing with hydrogen peroxide or alcohol!! -Seek additional medical attention if the wound is getting worse instead of better- redness increasing in size, pain getting worse, new/worsening discharge, new fevers/chills, etc. -If you do become concerned for infection, you can start the doxycycline. If the symptoms are still getting worse, follow-up with [place] or your PCP.    ED Prescriptions     Medication  Sig Dispense Auth. Provider   doxycycline (VIBRAMYCIN) 100 MG capsule Take 1 capsule (100 mg total) by mouth 2 (two) times daily for 7 days. 14 capsule Hazel Sams, PA-C      PDMP not reviewed this encounter.   Hazel Sams, PA-C 11/13/20 [phone]

## 2020-11-13 NOTE — Discharge Instructions (Addendum)
-  Leave the steri strips on for 4-5 days. You can apply vaseline to loosen them when it's time to remove them. We're also happy to help you here.  [facility] your wound with gentle soap and water 1-2 times daily.   Keep wrapped during the day or when you're doing something that could get it dirty (working, [employer], cooking, Social research officer, government). Avoid cleansing with hydrogen peroxide or alcohol!! -Seek additional medical attention if the wound is getting worse instead of better- redness increasing in size, pain getting worse, new/worsening discharge, new fevers/chills, etc. -If you do become concerned for infection, you can start the doxycycline. If the symptoms are still getting worse, follow-up with [place] or your PCP.

## 2020-11-13 NOTE — ED Notes (Signed)
Dried dsy removed from Lt anterior lower leg.

## 2020-11-16 ENCOUNTER — Other Ambulatory Visit: Payer: Self-pay | Admitting: Pharmacist

## 2020-11-16 DIAGNOSIS — I10 Essential (primary) hypertension: Secondary | ICD-10-CM

## 2020-11-27 DIAGNOSIS — H35033 Hypertensive retinopathy, bilateral: Secondary | ICD-10-CM | POA: Diagnosis not present

## 2020-11-27 DIAGNOSIS — Q141 Congenital malformation of retina: Secondary | ICD-10-CM | POA: Diagnosis not present

## 2020-11-27 DIAGNOSIS — I10 Essential (primary) hypertension: Secondary | ICD-10-CM | POA: Diagnosis not present

## 2020-11-27 DIAGNOSIS — H25813 Combined forms of age-related cataract, bilateral: Secondary | ICD-10-CM | POA: Diagnosis not present

## 2020-11-27 DIAGNOSIS — H43813 Vitreous degeneration, bilateral: Secondary | ICD-10-CM | POA: Diagnosis not present

## 2020-11-28 DIAGNOSIS — H209 Unspecified iridocyclitis: Secondary | ICD-10-CM | POA: Diagnosis not present

## 2020-11-28 DIAGNOSIS — H25811 Combined forms of age-related cataract, right eye: Secondary | ICD-10-CM | POA: Diagnosis not present

## 2020-11-28 DIAGNOSIS — H25812 Combined forms of age-related cataract, left eye: Secondary | ICD-10-CM | POA: Diagnosis not present

## 2020-11-28 DIAGNOSIS — Z01818 Encounter for other preprocedural examination: Secondary | ICD-10-CM | POA: Diagnosis not present

## 2020-12-01 ENCOUNTER — Encounter: Payer: Self-pay | Admitting: Family Medicine

## 2020-12-15 DIAGNOSIS — H25812 Combined forms of age-related cataract, left eye: Secondary | ICD-10-CM | POA: Diagnosis not present

## 2020-12-29 DIAGNOSIS — H25811 Combined forms of age-related cataract, right eye: Secondary | ICD-10-CM | POA: Diagnosis not present

## 2021-02-06 ENCOUNTER — Encounter: Payer: Self-pay | Admitting: Family Medicine

## 2021-02-06 ENCOUNTER — Ambulatory Visit (INDEPENDENT_AMBULATORY_CARE_PROVIDER_SITE_OTHER): Payer: Medicare Other | Admitting: Family Medicine

## 2021-02-06 ENCOUNTER — Other Ambulatory Visit: Payer: Self-pay

## 2021-02-06 VITALS — BP 151/80 | HR 70 | Wt 244.0 lb

## 2021-02-06 DIAGNOSIS — G5622 Lesion of ulnar nerve, left upper limb: Secondary | ICD-10-CM

## 2021-02-06 DIAGNOSIS — I10 Essential (primary) hypertension: Secondary | ICD-10-CM | POA: Diagnosis not present

## 2021-02-06 NOTE — Patient Instructions (Signed)
For the tingling in your fingers, I think it is related to nerve that is impinging your elbow.  You will want to avoid putting your elbows on tables and having pressure on it as that can make the pain worse.  You can try heat or ice to see if that helps.  Sometimes wrapping a towel or compression stocking around it at night can help mainly to ensure that you are not over flexing your elbow.  For the hypotension, I would keep track of the episodes and see if it is related at all to the sildenafil.  If you consistently have blood pressures that have the bottom number as lower than 60 please let me know and we may back off of the medications a little bit.  If you continue to have episodes of dizziness and feel like you are falling we may need to do the same as well.  Please make sure that you are doing slow movements when getting up or doing large position changes.  I am so excited about your weight loss and that you are doing a lot better with your back pain and getting back into biking again.

## 2021-02-06 NOTE — Progress Notes (Signed)
    SUBJECTIVE:   CHIEF COMPLAINT / HPI:   Patient reports that since our last visit he started using the [employer] subscription and has lost over 20 pounds.  He reports that through the program they do a calorie counting and that has actually helped him cut back on his alcohol intake as well and there are some nights where he does not drink anything at all.  He does note that his left fourth and fifth finger will intermittently become numb.  He does note that he rests on his elbows quite a bit and that makes the pain worse.  He also notes that he sleeps with his arm curled up and propped underneath him.  PERTINENT  PMH / PSH: Reviewed  OBJECTIVE:   BP (!) 151/80   Pulse 70   Wt 244 lb (110.7 kg)   SpO2 100%   BMI 31.33 kg/m   General: NAD, well-appearing, well-nourished Respiratory: No respiratory distress, breathing comfortably, able to speak in full sentences Skin: warm and dry, no rashes noted on exposed skin Psych: Appropriate affect and mood Left elbow: - Inspection: no obvious deformity. No swelling, erythema or bruising - Palpation: TTP over the cubital tunnel - ROM: full active ROM in flexion and extension. No crepitus - Strength: 5/5 strength in wrist flexion and extension without pain. 5/5 strength in biceps, triceps. - Neuro: NV intact distally - Special testing: positive Tinel's at the cubital tunnel    ASSESSMENT/PLAN:   HTN, goal below 140/90 Patient with intermittent hypotensive episodes documented in his BP log, patient intermittently has dizziness.  Unsure if this is related to concomitant use of sildenafil.  Other pressures have been stable. If BP continues to have symptomatic low measurements over the next several weeks then we will consider decreasing the losartan to 50 mg.   Cubital tunnel syndrome, Left Patient with positive Tinel's at the elbow for the ulnar nerve. Discussed conservative management with patient and to avoid positioning himself with his elbows  to help relieve the pressure.  Back pain  Weight loss Patient has had significant provement in his back pain secondary to his weight loss, he reports going from being around 260s and weight to around 235 on his home scale  Rise Patience, [place]

## 2021-02-06 NOTE — Assessment & Plan Note (Signed)
Patient with intermittent hypotensive episodes documented in his BP log, patient intermittently has dizziness.  Unsure if this is related to concomitant use of sildenafil.  Other pressures have been stable. If BP continues to have symptomatic low measurements over the next several weeks then we will consider decreasing the losartan to 50 mg.

## 2021-02-20 ENCOUNTER — Other Ambulatory Visit: Payer: Self-pay | Admitting: Family Medicine

## 2021-03-16 DIAGNOSIS — H43813 Vitreous degeneration, bilateral: Secondary | ICD-10-CM | POA: Diagnosis not present

## 2021-03-16 DIAGNOSIS — Q141 Congenital malformation of retina: Secondary | ICD-10-CM | POA: Diagnosis not present

## 2021-03-16 DIAGNOSIS — H35033 Hypertensive retinopathy, bilateral: Secondary | ICD-10-CM | POA: Diagnosis not present

## 2021-04-20 NOTE — Progress Notes (Signed)
Office Visit Note  Patient: Gregory Hansen             Date of Birth: February 24, 1951           MRN: [id number]             PCP: Rise Patience, DO Referring: Rise Patience, DO Visit Date: 05/03/2021 Occupation: @GUAROCC @  Subjective:  Follow-up  History of Present Illness: Gregory Hansen is a 71 y.o. male with history of ankylosing spondylitis, osteoarthritis and degenerative disc disease.  He states he has been doing well.  He had intentional weight loss of 10 pounds.  He states since the weight loss he has noticed increased discomfort in his lower back and in his knee joints.  He had his daughter's wedding last fall.  He states that he developed some numbness in his left middle finger which the orthopedic surgeon said was related to the ulnar nerve.  The symptoms have resolved now  Activities of Daily Living:  Patient reports morning stiffness for 0  none .   Patient Reports nocturnal pain.  Difficulty dressing/grooming: Denies Difficulty climbing stairs: Denies Difficulty getting out of chair: Denies Difficulty using hands for taps, buttons, cutlery, and/or writing: Denies  Review of Systems  Constitutional:  Negative for fatigue.  HENT:  Positive for mouth dryness.   Eyes:  Positive for dryness.  Respiratory:  Negative for shortness of breath.   Cardiovascular:  Positive for swelling in legs/feet.  Gastrointestinal:  Positive for constipation.  Endocrine: Positive for excessive thirst and increased urination.  Genitourinary:  Negative for difficulty urinating.  Musculoskeletal:  Positive for joint pain, gait problem and joint pain.  Skin:  Negative for rash.  Allergic/Immunologic: Negative for susceptible to infections.  Neurological:  Positive for numbness.  Hematological:  Positive for bruising/bleeding tendency.  Psychiatric/Behavioral:  Negative for sleep disturbance.    PMFS History:  Patient Active Problem List   Diagnosis Date Noted   Chronic bilateral low back  pain without sciatica 07/13/2019   Fall at home, initial encounter 02/02/2019   Rash 05/23/2018   Peripheral neuropathy 11/04/2017   Gynecomastia, male 02/18/2017   Healthcare maintenance 01/20/2017   Aortic atherosclerosis ([place]) 12/03/2016   History of cryptosporidiosis    S/P CABG x 4 10/29/2013   Hyperlipidemia 06/15/2008   HTN, goal below 140/90 06/15/2008   CAD (coronary artery disease) 06/15/2008   SPONDYLITIS, ANKYLOSING 06/15/2008   Atherosclerotic heart disease of native coronary artery without angina pectoris 06/15/2008   Gastro-esophageal reflux disease without esophagitis 06/15/2008    Past Medical History:  Diagnosis Date   Acute hepatitis 01/26/2019   Acute kidney failure ([place]) 03/30/2018   AKI (acute kidney injury) ([place])    Alcohol use disorder, mild, abuse 10/14/2019   Anginal pain ([place])    Anxiety    Arthritis    SPINE    Arthritis    Cellulitis 03/30/2018   Coronary artery disease    s/p multiple percutaneous interventions, PCI 202 for DMI and DES distal RCA and CFX-OM 2006   Coronary artery disease    Depression    Depression, major, single episode, mild ([place]) 06/15/2008   Qualifier: Diagnosis of  By: Orville Govern, CMA, Carol     GERD (gastroesophageal reflux disease)    Hyperlipidemia    mixed   Hyperlipidemia    Hypertension    Major depressive disorder, single episode 06/15/2008   Overview:  Overview:  Qualifier: Diagnosis of  By: Orville Govern CMA, Carol    MI (myocardial  infarction) ([place])    Myocardial infarction ([place])    ? 2006   Numbness and tingling in right hand 07/13/2019   Peripheral neuropathy    calves and both feet   Peripheral neuropathy    calves and both feet   Puncture wound of right foot 03/07/2020   Rhabdomyolysis 02/02/2019   Severe sepsis with acute organ dysfunction ([place]) 12/03/2016   Spondylitis, ankylosing ([place])     Family History  Problem Relation Age of Onset   Breast cancer Mother 64   Heart disease Father    Cancer Brother    Heart  disease Brother    Heart attack Brother    Leukemia Maternal Grandmother    Heart disease Maternal Grandfather    Heart disease Other        positive for cardiac disease and both brothers have had cardiac events   Allergic rhinitis Neg Hx    Angioedema Neg Hx    Asthma Neg Hx    Atopy Neg Hx    Immunodeficiency Neg Hx    Eczema Neg Hx    Urticaria Neg Hx    Colon cancer Neg Hx    Colon polyps Neg Hx    Esophageal cancer Neg Hx    Rectal cancer Neg Hx    Stomach cancer Neg Hx    Past Surgical History:  Procedure Laterality Date   ACHILLES TENDON REPAIR     CARDIAC CATHETERIZATION     stent RCA June3, 2002, Stent OM/PTCA circumflex August 13, 2000   CATARACT EXTRACTION Bilateral    CORONARY ANGIOPLASTY WITH STENT PLACEMENT     first one in 2006; had another place 3 months ago (August 2013)   CORONARY ANGIOPLASTY WITH STENT PLACEMENT  03/26/2013   RCA           DR COOPER   CORONARY ANGIOPLASTY WITH STENT PLACEMENT     CORONARY ARTERY BYPASS GRAFT N/A 10/29/2013   Procedure: CORONARY ARTERY BYPASS GRAFTING x 4 (LIMA-LAD, SVG-OM, SVG-PD-PL) ENDOSCOPIC VEIN HARVEST RIGHT THIGH;  Surgeon: Gaye Pollack, MD;  Location: [facility];  Service: Open Heart Surgery;  Laterality: N/A;   CORONARY ARTERY BYPASS GRAFT     FINGER SURGERY     5  TH     DIGIT RIGHT HAND   FINGER SURGERY     INTRAOPERATIVE TRANSESOPHAGEAL ECHOCARDIOGRAM N/A 10/29/2013   Procedure: INTRAOPERATIVE TRANSESOPHAGEAL ECHOCARDIOGRAM;  Surgeon: Gaye Pollack, MD;  Location: [facility];  Service: Open Heart Surgery;  Laterality: N/A;   KNEE ARTHROSCOPY W/ LASER  2003   LEFT HEART CATHETERIZATION WITH CORONARY ANGIOGRAM N/A 10/01/2011   Procedure: LEFT HEART CATHETERIZATION WITH CORONARY ANGIOGRAM;  Surgeon: [name], MD;  Location: [facility] CATH LAB;  Service: Cardiovascular;  Laterality: N/A;   LEFT HEART CATHETERIZATION WITH CORONARY ANGIOGRAM N/A 03/26/2013   Procedure: LEFT HEART CATHETERIZATION WITH CORONARY ANGIOGRAM;   Surgeon: Blane Ohara, MD;  Location: [facility] CATH LAB;  Service: Cardiovascular;  Laterality: N/A;   LEFT HEART CATHETERIZATION WITH CORONARY ANGIOGRAM N/A 10/25/2013   Procedure: LEFT HEART CATHETERIZATION WITH CORONARY ANGIOGRAM;  Surgeon: Blane Ohara, MD;  Location: [facility] CATH LAB;  Service: Cardiovascular;  Laterality: N/A;   TONSILLECTOMY     Social History   Social History Narrative   ** Merged History Encounter **       Lives in a one level home with son, and dog, Scientist, physiological. Works out of the house. 2 steps into house. Smoke alarms in house. No grab bars  in bathroom. Has some area rugs and throw rugs with backing.      No pets of his own. Restores antique pens, does like to walk and cycle but having problems with peripheral neuropathy.       Firearm is safely stored.      Wears seat belts in vehicle. Does use sunblock and tries to avoid sun due to vitiligo and previous sunburns.      Eats varied diet. Likes protein, fresh fruits and veggies. Watches fats, saturated fats. Drink a lot of water.    Immunization History  Administered Date(s) Administered   Influenza, High Dose Seasonal PF 11/23/2016   Influenza,inj,Quad PF,6+ Mos 12/18/2017, 11/20/2018   Influenza-Unspecified 12/02/2019, 12/01/2020   PFIZER(Purple Top)SARS-COV-2 Vaccination 05/03/2019, 05/24/2019, 12/07/2019, 06/06/2020   Pfizer Covid-19 Vaccine Bivalent Booster 53yrs & up 12/01/2020   Pneumococcal Conjugate-13 01/20/2017   Pneumococcal Polysaccharide-23 07/01/2015, 04/01/2018   Tdap 06/30/2015   Zoster Recombinat (Shingrix) 06/28/2019, 09/13/2019     Objective: Vital Signs: BP (!) 151/83 (BP Location: Left Arm, Patient Position: Sitting, Cuff Size: Large)    Pulse 69    Resp 12    Ht 6\' 2"  (1.88 m)    Wt 251 lb 6.4 oz (114 kg)    BMI 32.28 kg/m    Physical Exam Vitals and nursing note reviewed.  Constitutional:      Appearance: He is well-developed.  HENT:     Head: Normocephalic and atraumatic.  Eyes:      Conjunctiva/sclera: Conjunctivae normal.     Pupils: Pupils are equal, round, and reactive to light.  Cardiovascular:     Rate and Rhythm: Normal rate and regular rhythm.     Heart sounds: Normal heart sounds.  Pulmonary:     Effort: Pulmonary effort is normal.     Breath sounds: Normal breath sounds.  Abdominal:     General: Bowel sounds are normal.     Palpations: Abdomen is soft.  Musculoskeletal:     Cervical back: Normal range of motion and neck supple.  Skin:    General: Skin is warm and dry.     Capillary Refill: Capillary refill takes less than 2 seconds.  Neurological:     Mental Status: He is alert and oriented to person, place, and time.  Psychiatric:        Behavior: Behavior normal.     Musculoskeletal Exam: C-spine was in good range of motion.  He had good mobility in his lumbar spine.  He had no tenderness across thoracic or lumbar spine.  There was no tenderness over SI joints.  Shoulder joints, elbow joints, wrist joints with good range of motion.  He had bilateral PIP and DIP thickening.  He had good range of motion of his hip joints and knee joints.  There was no tenderness over ankles or MTPs.  CDAI Exam: CDAI Score: -- Patient Global: --; Provider Global: -- Swollen: --; Tender: -- Joint Exam 05/03/2021   No joint exam has been documented for this visit   There is currently no information documented on the homunculus. Go to the Rheumatology activity and complete the homunculus joint exam.  Investigation: No additional findings.  Imaging: No results found.  Recent Labs: Lab Results  Component Value Date   WBC 5.0 10/31/2020   HGB 10.2 (L) 10/31/2020   PLT 154 10/31/2020   NA 133 (L) 10/31/2020   K 4.0 10/31/2020   CL 99 10/31/2020   CO2 24 10/31/2020   GLUCOSE 99 10/31/2020  BUN 16 10/31/2020   CREATININE 1.04 10/31/2020   BILITOT 0.6 10/31/2020   ALKPHOS 141 (H) 02/25/2019   AST 33 10/31/2020   ALT 28 10/31/2020   PROT 7.6 10/31/2020    ALBUMIN 3.4 (L) 02/25/2019   CALCIUM 8.9 10/31/2020   GFRAA 105 07/24/2020   QFTBGOLDPLUS NEGATIVE 11/10/2019    Speciality Comments: Enbrel started December 09, 2019, Humira 04/22-07/22  Procedures:  No procedures performed Allergies: Patient has no known allergies.   Assessment / Plan:     Visit Diagnoses: Ankylosing spondylitis, unspecified site of spine (HCC)-continues to do well.  He had fairly good mobility in his entire spine.  He tried Enbrel for few months in 2021 and Humira for few months in 2022 with no improvement in his symptoms.  He denies any history of joint swelling.  He had no recurrence of iritis.  Primary osteoarthritis of both knees - Bilateral severe osteoarthritis and severe chondromalacia patella.  He had no warmth swelling or effusion.  He good range of motion his knee joints.  He has noticed improvement in his knee joint since he had intentional weight loss.  Chronic SI joint pain-he has discomfort in his lower back.  DDD (degenerative disc disease), thoracic - Thoracic kyphosis noted  Arthropathy of lumbar facet joint-he is off-and-on discomfort.  He is currently doing well.  History of iritis - No recurrence in the last 15-20 years  History of vertebral fracture - CT lumbar 01/26/19: acute predominantly horizontally oriented fracture of L1 vertebral body.  Due to injury  Essential hypertension-his blood pressure was elevated.  I advised him to monitor blood pressure closely and follow-up with his PCP.  Coronary artery disease involving native coronary artery of native heart without angina pectoris  S/P CABG x 4  Peripheral polyneuropathy  Gastro-esophageal reflux disease without esophagitis  IgA deficiency, selective ([place]) - He was evaluated by ID and immunology.  No interventions are needed.  History of depression  Vitiligo  History of cryptosporidiosis  History of alcohol use disorder  Orders: No orders of the defined types were placed in  this encounter.  No orders of the defined types were placed in this encounter.    Follow-Up Instructions: Return in about 1 year (around 05/03/2022) for Ankylosing spondylitis, Osteoarthritis.   Bo Merino, MD  Note - This record has been created using Editor, commissioning.  Chart creation errors have been sought, but may not always  have been located. Such creation errors do not reflect on  the standard of medical care.

## 2021-04-23 ENCOUNTER — Other Ambulatory Visit: Payer: Self-pay | Admitting: Cardiovascular Disease

## 2021-05-03 ENCOUNTER — Encounter: Payer: Self-pay | Admitting: Rheumatology

## 2021-05-03 ENCOUNTER — Ambulatory Visit: Payer: Medicare Other | Admitting: Rheumatology

## 2021-05-03 ENCOUNTER — Other Ambulatory Visit: Payer: Self-pay

## 2021-05-03 VITALS — BP 151/83 | HR 69 | Resp 12 | Ht 74.0 in | Wt 251.4 lb

## 2021-05-03 DIAGNOSIS — Z951 Presence of aortocoronary bypass graft: Secondary | ICD-10-CM | POA: Diagnosis not present

## 2021-05-03 DIAGNOSIS — Z8781 Personal history of (healed) traumatic fracture: Secondary | ICD-10-CM

## 2021-05-03 DIAGNOSIS — Z87898 Personal history of other specified conditions: Secondary | ICD-10-CM

## 2021-05-03 DIAGNOSIS — M5134 Other intervertebral disc degeneration, thoracic region: Secondary | ICD-10-CM | POA: Diagnosis not present

## 2021-05-03 DIAGNOSIS — D802 Selective deficiency of immunoglobulin A [IgA]: Secondary | ICD-10-CM

## 2021-05-03 DIAGNOSIS — Z8659 Personal history of other mental and behavioral disorders: Secondary | ICD-10-CM

## 2021-05-03 DIAGNOSIS — M170 Bilateral primary osteoarthritis of knee: Secondary | ICD-10-CM | POA: Diagnosis not present

## 2021-05-03 DIAGNOSIS — Z79899 Other long term (current) drug therapy: Secondary | ICD-10-CM

## 2021-05-03 DIAGNOSIS — L80 Vitiligo: Secondary | ICD-10-CM

## 2021-05-03 DIAGNOSIS — Z8669 Personal history of other diseases of the nervous system and sense organs: Secondary | ICD-10-CM

## 2021-05-03 DIAGNOSIS — I10 Essential (primary) hypertension: Secondary | ICD-10-CM | POA: Diagnosis not present

## 2021-05-03 DIAGNOSIS — M533 Sacrococcygeal disorders, not elsewhere classified: Secondary | ICD-10-CM

## 2021-05-03 DIAGNOSIS — G629 Polyneuropathy, unspecified: Secondary | ICD-10-CM

## 2021-05-03 DIAGNOSIS — K219 Gastro-esophageal reflux disease without esophagitis: Secondary | ICD-10-CM | POA: Diagnosis not present

## 2021-05-03 DIAGNOSIS — M47816 Spondylosis without myelopathy or radiculopathy, lumbar region: Secondary | ICD-10-CM

## 2021-05-03 DIAGNOSIS — M459 Ankylosing spondylitis of unspecified sites in spine: Secondary | ICD-10-CM | POA: Diagnosis not present

## 2021-05-03 DIAGNOSIS — G8929 Other chronic pain: Secondary | ICD-10-CM

## 2021-05-03 DIAGNOSIS — I2510 Atherosclerotic heart disease of native coronary artery without angina pectoris: Secondary | ICD-10-CM

## 2021-05-03 DIAGNOSIS — Z8619 Personal history of other infectious and parasitic diseases: Secondary | ICD-10-CM

## 2021-06-28 ENCOUNTER — Other Ambulatory Visit: Payer: Self-pay | Admitting: Cardiovascular Disease

## 2021-07-30 ENCOUNTER — Other Ambulatory Visit: Payer: Self-pay | Admitting: Family Medicine

## 2021-07-30 DIAGNOSIS — I10 Essential (primary) hypertension: Secondary | ICD-10-CM

## 2021-08-14 ENCOUNTER — Encounter: Payer: Self-pay | Admitting: *Deleted

## 2021-08-21 ENCOUNTER — Ambulatory Visit (INDEPENDENT_AMBULATORY_CARE_PROVIDER_SITE_OTHER): Payer: Medicare Other | Admitting: Family Medicine

## 2021-08-21 ENCOUNTER — Encounter: Payer: Self-pay | Admitting: Family Medicine

## 2021-08-21 VITALS — BP 128/70 | HR 83 | Ht 74.0 in | Wt 258.0 lb

## 2021-08-21 DIAGNOSIS — K219 Gastro-esophageal reflux disease without esophagitis: Secondary | ICD-10-CM

## 2021-08-21 DIAGNOSIS — M25561 Pain in right knee: Secondary | ICD-10-CM

## 2021-08-21 MED ORDER — PANTOPRAZOLE SODIUM 40 MG PO TBEC
40.0000 mg | DELAYED_RELEASE_TABLET | Freq: Every day | ORAL | 2 refills | Status: DC
Start: 2021-08-21 — End: 2021-08-21

## 2021-08-21 MED ORDER — BACLOFEN 10 MG PO TABS: 5.0000 mg | ORAL_TABLET | Freq: Three times a day (TID) | ORAL | 0 refills | Status: DC | PRN

## 2021-08-21 MED ORDER — PANTOPRAZOLE SODIUM 40 MG PO TBEC: 40.0000 mg | DELAYED_RELEASE_TABLET | Freq: Every day | ORAL | 2 refills | Status: DC

## 2021-08-21 NOTE — Assessment & Plan Note (Signed)
Patient trialed 1 week off PPI but had significant recurrence of symptoms. - Protonix 40 mg refilled

## 2021-08-21 NOTE — Addendum Note (Signed)
Addended by: Christen Bame D on: 08/21/2021 01:57 PM   Modules accepted: Orders

## 2021-08-21 NOTE — Patient Instructions (Signed)
I am going to go ahead and send in your prescription for Protonix.  Let me know if you have any issues with getting it.  For your knee pain, I asked on my list of possibilities is a muscular issue so I think we should trial out a safer muscle relaxant.  We are going to try a medication called baclofen, I am going to send in the 10 mg tablets but you can cut these in half to start off with and see if that helps you. If there is no improvement over the next 7 days then I want you to call the clinic back and let me know and we will plan to get an x-ray of your knee at that time.

## 2021-08-21 NOTE — Progress Notes (Signed)
SUBJECTIVE:   CHIEF COMPLAINT / HPI:   Patient presents for right knee pain.  He notes that for the last few weeks/months he has been having a pain that is underneath his knee.  The pain somewhat travels upwards towards his buttocks and his right buttocks is also sore.  He does have a history of a pinched nerve that is required muscle relaxants before.  He reports that he has tried all the conservative measures including different types of massage therapy, stretching, Ibruprofen without much improvement. He does note that if he puts his ankle on his other knee that the stretch it causes will sometimes help his pain. He denies any radiation from his back or any new pain (as he has chronic pain and neuropathy from ankylosing spondylitis).  Patient does also note that over the last year he is been having some decreased walking due to his legs being more aching.  He notes that his legs are not cramping in his bilateral but that when he is walking in the grocery store he may have to take a sit down in between.  He has had some weight gain due to not being able  He is also wondering if he can get back on his Protonix.  He notes that it was being filled by his cardiologist but it has been discontinued.  He trialed being off of it for about a week and noted that the reflux symptoms significantly worsened.  PERTINENT  PMH / PSH: Reviewed  OBJECTIVE:   BP 128/70   Pulse 83   Ht '6\' 2"'$  (1.88 m)   Wt 258 lb (117 kg)   SpO2 98%   BMI 33.13 kg/m   General: NAD, well-appearing, well-nourished Respiratory: No respiratory distress, breathing comfortably, able to speak in full sentences Skin: warm and dry, no rashes noted on exposed skin Psych: Appropriate affect and mood Right Knee: - Inspection: no gross deformity. No swelling/effusion, erythema or bruising. Skin intact - Palpation: no TTP over the joint line or along the greater trochanter of the hip - ROM: Slightly decreased ROM of the right hip  with FABER motions, they were motion created pain/stretching sensation in the piriformis muscle - Strength: 5/5 strength - Neuro/vasc: NV intact - Special Tests: - LIGAMENTS: negative anterior and posterior drawer, negative Lachman's, no MCL or LCL laxity  -- MENISCUS: negative McMurray's -- PF JOINT: nml patellar mobility bilaterally.  negative patellar grind, negative patellar apprehension  Hips: slightly limited ROM, positive FABER of the right leg.  Back: Spinous processes of the lumbar spine nontender to palpation, no paraspinal muscular pain.  No SI joint pain on palpation.   ASSESSMENT/PLAN:   Right knee pain  right piriformis pain Differential includes muscular strain, arthritis, patellofemoral syndrome, Baker's cyst.  Physical exam is largely reassuring and more consistent with a muscular strain.  Patient does also seem to have piriformis/gluteus strain and is currently doing piriformis stretches which have improved symptoms somewhat. At this time, will treat as such and manage conservatively.  If no improvement with muscle relaxers over the next 7 days then will consider x-ray of the knee. - Continue piriformis stretches - Encourage continue ambulation as able - Baclofen 10 mg 3 times daily as needed, instructed patient to cut pills in half and trial the 5 mg dosing first - Ibuprofen as needed - Follow-up in 1 week if no improvement with plans for x-ray at that time  Bilateral lower extremity aching Initially concern for PAD given the  description, though not necessarily consistent with claudication.  ABIs obtained in the office were normal.  Patient does have some hyperpigmentation of the lower extremities which could be a sign of venous insufficiency. - Continue to closely monitor - Treating right knee pain and will monitor for worsening or if improvement was able to be more active  Gastro-esophageal reflux disease without esophagitis Patient trialed 1 week off PPI but had  significant recurrence of symptoms. - Protonix 40 mg refilled     Gregory Hansen, Gregory Hansen

## 2021-08-23 ENCOUNTER — Other Ambulatory Visit: Payer: Self-pay | Admitting: Family Medicine

## 2021-08-23 DIAGNOSIS — I10 Essential (primary) hypertension: Secondary | ICD-10-CM

## 2021-08-24 ENCOUNTER — Other Ambulatory Visit: Payer: Self-pay | Admitting: Family Medicine

## 2021-08-24 DIAGNOSIS — I10 Essential (primary) hypertension: Secondary | ICD-10-CM

## 2021-08-27 ENCOUNTER — Other Ambulatory Visit: Payer: Self-pay

## 2021-08-27 MED ORDER — HYDROCHLOROTHIAZIDE 12.5 MG PO CAPS: 12.5000 mg | ORAL_CAPSULE | Freq: Every day | ORAL | 3 refills | Status: DC

## 2021-10-15 ENCOUNTER — Encounter: Payer: Self-pay | Admitting: Family Medicine

## 2021-10-15 NOTE — Telephone Encounter (Signed)
Called patient in response to [employer].   At approx 5 am, patient ambulated to restroom. He went to sit on toilet and then fell off. This is the second time this has happened.   Reports that back of left calf and ankle are sore. He has been ambulating with assistance from walker.  Reports that he has been feeling on and off dizziness for "sometime now"  Unsure if he blacked out last night, however, did feel dizzy prior to falling. Denies head injury.   Provided with ED precautions and scheduled for tomorrow afternoon with Dr. Ronnald Ramp.   Talbot Grumbling, RN

## 2021-10-16 ENCOUNTER — Encounter: Payer: Self-pay | Admitting: Student

## 2021-10-16 ENCOUNTER — Ambulatory Visit (HOSPITAL_COMMUNITY): Payer: Medicare Other | Attending: Family Medicine

## 2021-10-16 ENCOUNTER — Ambulatory Visit (INDEPENDENT_AMBULATORY_CARE_PROVIDER_SITE_OTHER): Payer: Medicare Other | Admitting: Family Medicine

## 2021-10-16 VITALS — BP 117/58 | HR 79 | Ht 74.0 in | Wt 255.0 lb

## 2021-10-16 DIAGNOSIS — R55 Syncope and collapse: Secondary | ICD-10-CM | POA: Diagnosis not present

## 2021-10-16 NOTE — Patient Instructions (Signed)
Good to see you today - Thank you for coming in!  Things we discussed today:  1) Fainting and lightheadedness: Your BP is lower than we want. This can cause lightheadedness and fainting if it is too low. Please STOP taking your losartan and hydrochlorathiazide. These meds could be contributing to your low blood pressure.  We will also check blood tests to check your electrolytes and you have been scheduled to get a cardiac ultrasound to look at your heart.  Please always bring your medication bottles  Come back to see [place] in 2 weeks to check on your blood pressure.

## 2021-10-16 NOTE — Assessment & Plan Note (Addendum)
Hx of lightheadedness associated with standing, recently increasing in frequency. Denies SOB, chest pain, or vision changes during episodes. Had a syncope episode this past Sunday when sitting on toilet, woke up sec-mins later, had urinary incontinence, and was alert soon after waking up. Denies head trauma. Had similar episode of falling in bathroom 2 years ago, but did not have LOC at that time. BP today is lower than his usual. Orthostatics today unremarkable. EKG NSR. Differential includes syncope due to hypotension, vasovagal, arrhythmia, and seizure. Hypotension is most likely given BP lower than his baseline and lightheadedness associated with standing, although orthostatics was wnl today. Vasovagal is also considered given syncope in setting of urination, but less likely to cause frequent lightheadedness without obvious trigger. Arrhythmia and other cardiac etiologies is considered given pts cardiac hx, although EKG unremarkable today. Seizure is less likely to have rapid recovery of alertness after syncope and does not explain lightheadedness.  - Stop HCTZ and Losartan. Follow-up in 2wks to check BP. - Consider thorough Neuro exam at f/u visit - Cardiac U/S scheduled - BMP for electrolyte abnormalities - EKG and orthostatics wnl today

## 2021-10-16 NOTE — Progress Notes (Signed)
    SUBJECTIVE:   CHIEF COMPLAINT / HPI: Gregory Hansen is a 59yoM w/ hx of HTN, CAD w/ bypass that presents for a recent fall. On Sunday, pt woke up at 4am to urinate, he was using his walker, sat down on the toilet, leaned forward and loss conciousness. Pt woke up on the floor and had urinary incontinence while passed out. He regained consciousness quickly and was alert. Did not have head cuts or bruises. He reports that he has had increased lightheadedness in past few years that has worsened recently. He reports lightheadedness is associated with standing. Denies SOB, chest pain, or vision changes when lightheaded. He had a similar episode of falling in the bathroom 2 years ago, but did not have complete LOC at that time.   PERTINENT  PMH / PSH: as above  OBJECTIVE:   BP (!) 117/58   Pulse 79   Ht '6\' 2"'$  (1.88 m)   Wt 255 lb (115.7 kg)   SpO2 99%   BMI 32.74 kg/m   Gen: Friendly, cheerful older man.  Neuro: Alert and grossly oriented. Strength grossly intact. Lightheadedness when ambulating from front desk to exam room.  HEENT: NCAT. Sclera anicteric Pulm: Normal WOB on RA  Orthostatic BP unremarkable EKG shows NSR, regular intervals, and no ST changes  ASSESSMENT/PLAN:   Syncope Hx of lightheadedness associated with standing, recently increasing in frequency. Denies SOB, chest pain, or vision changes during episodes. Had a syncope episode this past Sunday when sitting on toilet, woke up sec-mins later, had urinary incontinence, and was alert soon after waking up. Denies head trauma. Had similar episode of falling in bathroom 2 years ago, but did not have LOC at that time. BP today is lower than his usual. Orthostatics today unremarkable. EKG NSR. Differential includes syncope due to hypotension, vasovagal, arrhythmia, and seizure. Hypotension is most likely given BP lower than his baseline and lightheadedness associated with standing, although orthostatics was wnl today. Vasovagal is also  considered given syncope in setting of urination, but less likely to cause frequent lightheadedness without obvious trigger. Arrhythmia and other cardiac etiologies is considered given pts cardiac hx, although EKG unremarkable today. Seizure is less likely to have rapid recovery of alertness after syncope and does not explain lightheadedness.  - Stop HCTZ and Losartan. Follow-up in 2wks to check BP. - Consider thorough Neuro exam at f/u visit - Cardiac U/S scheduled - BMP for electrolyte abnormalities - EKG and orthostatics wnl today    Arlyce Dice, MD [place]

## 2021-10-17 LAB — CBC
Hematocrit: 33.3 % — ABNORMAL LOW (ref 37.5–51.0)
Hemoglobin: 9.7 g/dL — ABNORMAL LOW (ref 13.0–17.7)
MCH: 22.8 pg — ABNORMAL LOW (ref 26.6–33.0)
MCHC: 29.1 g/dL — ABNORMAL LOW (ref 31.5–35.7)
MCV: 78 fL — ABNORMAL LOW (ref 79–97)
Platelets: 140 10*3/uL — ABNORMAL LOW (ref 150–450)
RBC: 4.26 x10E6/uL (ref 4.14–5.80)
RDW: 17.1 % — ABNORMAL HIGH (ref 11.6–15.4)
WBC: 7.2 10*3/uL (ref 3.4–10.8)

## 2021-10-17 LAB — COMPREHENSIVE METABOLIC PANEL
ALT: 24 IU/L (ref 0–44)
AST: 31 IU/L (ref 0–40)
Albumin/Globulin Ratio: 1.1 — ABNORMAL LOW (ref 1.2–2.2)
Albumin: 3.8 g/dL (ref 3.8–4.8)
Alkaline Phosphatase: 76 IU/L (ref 44–121)
BUN/Creatinine Ratio: 16 (ref 10–24)
BUN: 24 mg/dL (ref 8–27)
Bilirubin Total: 1 mg/dL (ref 0.0–1.2)
CO2: 21 mmol/L (ref 20–29)
Calcium: 8.8 mg/dL (ref 8.6–10.2)
Chloride: 101 mmol/L (ref 96–106)
Creatinine, Ser: 1.5 mg/dL — ABNORMAL HIGH (ref 0.76–1.27)
Globulin, Total: 3.6 g/dL (ref 1.5–4.5)
Glucose: 100 mg/dL — ABNORMAL HIGH (ref 70–99)
Potassium: 4.1 mmol/L (ref 3.5–5.2)
Sodium: 137 mmol/L (ref 134–144)
Total Protein: 7.4 g/dL (ref 6.0–8.5)
eGFR: 49 mL/min/{1.73_m2} — ABNORMAL LOW (ref >59)

## 2021-10-19 ENCOUNTER — Ambulatory Visit (HOSPITAL_COMMUNITY)
Admission: RE | Admit: 2021-10-19 | Discharge: 2021-10-19 | Disposition: A | Discharge status: Home / Self Care | Payer: Medicare Other | Source: Ambulatory Visit | Attending: Family Medicine | Admitting: Family Medicine

## 2021-10-19 DIAGNOSIS — E785 Hyperlipidemia, unspecified: Secondary | ICD-10-CM | POA: Diagnosis not present

## 2021-10-19 DIAGNOSIS — R55 Syncope and collapse: Secondary | ICD-10-CM | POA: Insufficient documentation

## 2021-10-19 DIAGNOSIS — Z951 Presence of aortocoronary bypass graft: Secondary | ICD-10-CM | POA: Insufficient documentation

## 2021-10-19 DIAGNOSIS — I358 Other nonrheumatic aortic valve disorders: Secondary | ICD-10-CM | POA: Diagnosis not present

## 2021-10-19 DIAGNOSIS — I10 Essential (primary) hypertension: Secondary | ICD-10-CM | POA: Insufficient documentation

## 2021-10-19 LAB — ECHOCARDIOGRAM COMPLETE
Area-P 1/2: 2.74 cm2
S' Lateral: 3.1 cm

## 2021-10-29 ENCOUNTER — Telehealth: Payer: Self-pay | Admitting: Family Medicine

## 2021-10-29 NOTE — Telephone Encounter (Signed)
Discussed that echo results are unremarkable. Had AKI, likely due to hypotension. Since stopping losartan and HCTZ, he reports that he is having fewer episodes of lightheadedness. No syncope. He did notice increased leg swelling so he restarted his HCTZ. Has f/u with Dr Oleh Genin later this month.

## 2021-11-05 ENCOUNTER — Encounter: Payer: Self-pay | Admitting: Family Medicine

## 2021-11-05 ENCOUNTER — Ambulatory Visit (INDEPENDENT_AMBULATORY_CARE_PROVIDER_SITE_OTHER): Payer: Medicare Other | Admitting: Family Medicine

## 2021-11-05 VITALS — BP 126/78 | HR 70 | Ht 74.0 in | Wt 252.0 lb

## 2021-11-05 DIAGNOSIS — I10 Essential (primary) hypertension: Secondary | ICD-10-CM

## 2021-11-05 NOTE — Patient Instructions (Signed)
I am so glad you have not had any more syncopal events.  I want you to keep a close eye in your blood pressures that you have been, you been doing great with that.  I want you to keep measurements over the next week, if the measurements are consistently >140 for the top number then I want you to call back and let me know.  At that time we may need to consider adding back a really small dose of the previous medication losartan.  Continue taking hydrochlorothiazide, we will recheck your kidney function today.

## 2021-11-05 NOTE — Assessment & Plan Note (Signed)
Due to syncopal events, losartan 100 mg daily and HCTZ 12.5 mg daily was stopped at last visit in our office.  Patient has since restarted the HCTZ without any symptoms.  Did have 1 dose of losartan and had subsequent hypotensive measurements. - Continue HCTZ 12 point milligram daily - BMP today - Monitor blood pressures, if systolic persistently > 712 then make an appointment in 1-2 weeks or call. - Patient to bring BP cuff to next visit so we can compare against before adjusting medications - If still elevated, consideration to add back a very low-dose of losartan with close monitoring

## 2021-11-05 NOTE — Progress Notes (Signed)
    SUBJECTIVE:   CHIEF COMPLAINT / HPI:   HTN  Syncope follow-up - No further syncopal episodes - At last visit they had stopped Losartan and HCTZ - Patient had leg swelling and restarted HCTZ with stable BP and no symptoms - Had elevations of BP into the 160s and took one dose of Losartan and subsequent measurements were hypotensive with systolics in the 61U.  - Feels that his home cuff measures higher than here in the office.   PERTINENT  PMH / PSH: Reviewed  OBJECTIVE:   BP 126/78   Pulse 70   Ht '6\' 2"'$  (1.88 m)   Wt 252 lb (114.3 kg)   SpO2 100%   BMI 32.35 kg/m   General: NAD, well-appearing, well-nourished Respiratory: No respiratory distress, breathing comfortably, able to speak in full sentences Skin: warm and dry, no rashes noted on exposed skin Psych: Appropriate affect and mood   ASSESSMENT/PLAN:   HTN, goal below 140/90 Due to syncopal events, losartan 100 mg daily and HCTZ 12.5 mg daily was stopped at last visit in our office.  Patient has since restarted the HCTZ without any symptoms.  Did have 1 dose of losartan and had subsequent hypotensive measurements. - Continue HCTZ 12 point milligram daily - BMP today - Monitor blood pressures, if systolic persistently > 837 then make an appointment in 1-2 weeks or call. - Patient to bring BP cuff to next visit so we can compare against before adjusting medications - If still elevated, consideration to add back a very low-dose of losartan with close monitoring     Rise Patience, [place]

## 2021-11-06 LAB — BASIC METABOLIC PANEL
BUN/Creatinine Ratio: 12 (ref 10–24)
BUN: 14 mg/dL (ref 8–27)
CO2: 23 mmol/L (ref 20–29)
Calcium: 9.3 mg/dL (ref 8.6–10.2)
Chloride: 97 mmol/L (ref 96–106)
Creatinine, Ser: 1.13 mg/dL (ref 0.76–1.27)
Glucose: 103 mg/dL — ABNORMAL HIGH (ref 70–99)
Potassium: 3.6 mmol/L (ref 3.5–5.2)
Sodium: 135 mmol/L (ref 134–144)
eGFR: 69 mL/min/{1.73_m2} (ref >59)

## 2021-11-11 ENCOUNTER — Other Ambulatory Visit: Payer: Self-pay | Admitting: Cardiovascular Disease

## 2021-11-23 ENCOUNTER — Other Ambulatory Visit: Payer: Self-pay | Admitting: Family Medicine

## 2021-12-03 ENCOUNTER — Other Ambulatory Visit: Payer: Self-pay | Admitting: Physician Assistant

## 2021-12-03 NOTE — Telephone Encounter (Signed)
Pt's pharmacy is requesting a refill on sildenafil. Would Dr. Burt Knack like to refill this medication? Please address

## 2021-12-14 ENCOUNTER — Encounter (HOSPITAL_COMMUNITY): Payer: Self-pay | Admitting: Emergency Medicine

## 2021-12-14 ENCOUNTER — Encounter (HOSPITAL_COMMUNITY): Payer: Self-pay

## 2021-12-14 ENCOUNTER — Emergency Department (HOSPITAL_COMMUNITY)
Admission: EM | Admit: 2021-12-14 | Discharge: 2021-12-14 | Disposition: A | Discharge status: Home / Self Care | Payer: Medicare Other | Attending: Emergency Medicine | Admitting: Emergency Medicine

## 2021-12-14 ENCOUNTER — Ambulatory Visit (INDEPENDENT_AMBULATORY_CARE_PROVIDER_SITE_OTHER): Payer: Medicare Other

## 2021-12-14 ENCOUNTER — Other Ambulatory Visit: Payer: Self-pay

## 2021-12-14 ENCOUNTER — Ambulatory Visit (HOSPITAL_COMMUNITY)
Admission: EM | Admit: 2021-12-14 | Discharge: 2021-12-14 | Disposition: A | Discharge status: Home / Self Care | Payer: Medicare Other | Attending: Physician Assistant | Admitting: Physician Assistant

## 2021-12-14 DIAGNOSIS — Z7982 Long term (current) use of aspirin: Secondary | ICD-10-CM | POA: Insufficient documentation

## 2021-12-14 DIAGNOSIS — S82832A Other fracture of upper and lower end of left fibula, initial encounter for closed fracture: Secondary | ICD-10-CM

## 2021-12-14 DIAGNOSIS — J90 Pleural effusion, not elsewhere classified: Secondary | ICD-10-CM

## 2021-12-14 DIAGNOSIS — W08XXXA Fall from other furniture, initial encounter: Secondary | ICD-10-CM | POA: Diagnosis not present

## 2021-12-14 DIAGNOSIS — S299XXA Unspecified injury of thorax, initial encounter: Secondary | ICD-10-CM | POA: Diagnosis present

## 2021-12-14 DIAGNOSIS — S301XXA Contusion of abdominal wall, initial encounter: Secondary | ICD-10-CM | POA: Insufficient documentation

## 2021-12-14 DIAGNOSIS — S2241XA Multiple fractures of ribs, right side, initial encounter for closed fracture: Secondary | ICD-10-CM | POA: Diagnosis not present

## 2021-12-14 DIAGNOSIS — Z79899 Other long term (current) drug therapy: Secondary | ICD-10-CM | POA: Diagnosis not present

## 2021-12-14 DIAGNOSIS — S82432A Displaced oblique fracture of shaft of left fibula, initial encounter for closed fracture: Secondary | ICD-10-CM | POA: Insufficient documentation

## 2021-12-14 DIAGNOSIS — S82831A Other fracture of upper and lower end of right fibula, initial encounter for closed fracture: Secondary | ICD-10-CM | POA: Diagnosis not present

## 2021-12-14 DIAGNOSIS — S82839A Other fracture of upper and lower end of unspecified fibula, initial encounter for closed fracture: Secondary | ICD-10-CM

## 2021-12-14 DIAGNOSIS — J439 Emphysema, unspecified: Secondary | ICD-10-CM | POA: Diagnosis not present

## 2021-12-14 MED ORDER — HYDROCODONE-ACETAMINOPHEN 5-325 MG PO TABS: 1.0000 | ORAL_TABLET | Freq: Four times a day (QID) | ORAL | 0 refills | Status: DC | PRN

## 2021-12-14 MED ORDER — DOCUSATE SODIUM 100 MG PO CAPS: 100.0000 mg | ORAL_CAPSULE | Freq: Two times a day (BID) | ORAL | 0 refills | Status: DC

## 2021-12-14 NOTE — ED Notes (Signed)
Patient is being discharged from the Urgent Care and sent to the Emergency Department via personal vehicle. Per Ewell Poe PA, patient is in need of higher level of care due to broken leg and ribs. Patient is aware and verbalizes understanding of plan of care.  Vitals:   12/14/21 1805  BP: (!) 186/101  Pulse: 64  Resp: 20  Temp: 98.2 F (36.8 C)  SpO2: 97%

## 2021-12-14 NOTE — Discharge Instructions (Addendum)
Wear the cam walker boot to stabilize your ankle.  Take the medications as needed for your rib pain.  Follow-up with the orthopedic doctor to be rechecked.  Return to the emergency room for fevers chills worsening symptoms

## 2021-12-14 NOTE — ED Provider Notes (Signed)
[facility] EMERGENCY DEPARTMENT Provider Note   CSN: [id number] Arrival date & time: 12/14/21  1918     History  Chief Complaint  Patient presents with   Gregory Hansen is a 71 y.o. male.  HPI   Patient presents from the urgent care for evaluation of rib fractures and a fibula fracture.  Patient states he stubbed his toe trying to get to his toilet a couple days ago.  He went to sit down on the bench and while leaning forward the bench flipped and patient ended up falling onto his right ribs.  Patient sustained significant bruising in his flank area.  He has noted some persistent discomfort in that area especially when he presses on it.  It also hurts if he takes a deep breath or cough.  He is not feeling short of breath.  He is not feeling lightheaded.  Has not had any vomiting or diarrhea.  No abdominal pain.  Patient had a fainting spell several weeks ago.  During that episode he did twist his left leg.  He had noticed swelling after that initially but otherwise had been able to walk around on it and had not thought too much of it.  He went to an urgent care today where he had x-rays of his ribs and ankle.  Showed a distal fibula fracture as well as several rib fractures.  He was sent to the ED for further evaluation.  Home Medications Prior to Admission medications   Medication Sig Start Date End Date Taking? Authorizing Provider  docusate sodium (COLACE) 100 MG capsule Take 1 capsule (100 mg total) by mouth every 12 (twelve) hours. 12/14/21  Yes Dorie Rank, MD  HYDROcodone-acetaminophen (NORCO/VICODIN) 5-325 MG tablet Take 1 tablet by mouth every 6 (six) hours as needed. 12/14/21  Yes Dorie Rank, MD  aspirin 81 MG tablet Take 81 mg by mouth every evening.     [provider]  buPROPion (WELLBUTRIN XL) 300 MG 24 hr tablet Take 300 mg by mouth daily.    [provider]  carvedilol (COREG) 12.5 MG tablet TAKE 1 TABLET BY MOUTH  TWICE DAILY  WITH MEALS 07/30/21   Ganta, Anupa, DO  hydrochlorothiazide (MICROZIDE) 12.5 MG capsule Take 12.5 mg by mouth daily.    [provider]  IBUPROFEN PO Take by mouth.    [provider]  nitroGLYCERIN (NITROSTAT) 0.4 MG SL tablet Place 1 tablet (0.4 mg total) under the tongue every 5 (five) minutes as needed for chest pain. 10/27/20 08/21/21  Richardson Dopp T, PA-C  pantoprazole (PROTONIX) 40 MG tablet Take 1 tablet (40 mg total) by mouth daily. 08/21/21   Lilland, Alana, DO  rosuvastatin (CRESTOR) 20 MG tablet TAKE 1 TABLET BY MOUTH DAILY 11/23/21   Lilland, Alana, DO  sildenafil (REVATIO) 20 MG tablet TAKE 2-5 TABLETS BY MOUTH AS NEEDED PRIOR TO SEXUAL ACTIVITY 12/03/21   Richardson Dopp T, PA-C  Vilazodone HCl (VIIBRYD PO) Take 20 mg by mouth daily.     [provider]  vitamin B-12 (CYANOCOBALAMIN) 1000 MCG tablet Take 1,000 mcg by mouth daily.    [provider]      Allergies    Patient has no known allergies.    Review of Systems   Review of Systems  Physical Exam Updated Vital Signs BP (!) 160/86   Pulse 65   Temp 98.6 F (37 C) (Oral)   Resp 18   SpO2 99%  Physical  Exam Vitals and nursing note reviewed.  Constitutional:      General: He is not in acute distress.    Appearance: He is well-developed.  HENT:     Head: Normocephalic and atraumatic.     Right Ear: External ear normal.     Left Ear: External ear normal.  Eyes:     General: No scleral icterus.       Right eye: No discharge.        Left eye: No discharge.     Conjunctiva/sclera: Conjunctivae normal.  Neck:     Trachea: No tracheal deviation.  Cardiovascular:     Rate and Rhythm: Normal rate.  Pulmonary:     Effort: Pulmonary effort is normal. No respiratory distress.     Breath sounds: No stridor.     Comments: Bruising noted right flank area, tenderness to palpation Abdominal:     General: There is no distension.  Musculoskeletal:        General: No swelling, tenderness or  deformity.     Cervical back: Neck supple.     Comments: No significant tenderness distal fibula, no erythema or edema  Skin:    General: Skin is warm and dry.     Findings: No rash.  Neurological:     Mental Status: He is alert.     Cranial Nerves: Cranial nerve deficit: no gross deficits.     ED Results / Procedures / Treatments   Labs (all labs ordered are listed, but only abnormal results are displayed) Labs Reviewed - No data to display  EKG None  Radiology DG Ribs Unilateral W/Chest Right  Result Date: 12/14/2021 CLINICAL DATA:  Injury. EXAM: RIGHT RIBS AND CHEST - 3+ VIEW COMPARISON:  Chest x-ray 12/03/2016 FINDINGS: There are acute lateral right eighth, ninth and tenth rib fractures. The eighth and ninth rib fractures are displaced. There is some mild chest wall emphysema in a small right pleural effusion. No pneumothorax visualized. The cardiomediastinal silhouette is stable. Sternotomy wires are again seen. No focal lung infiltrate. IMPRESSION: 1. Acute lateral right eighth, ninth and tenth rib fractures. (Ninth and tenth rib fractures are displaced) 2. Small right pleural effusion. 3. No pneumothorax. 4. Mild chest wall emphysema. Electronically Signed   By: Ronney Asters M.D.   On: 12/14/2021 18:49   DG Foot Complete Left  Result Date: 12/14/2021 CLINICAL DATA:  Injury. EXAM: LEFT FOOT - COMPLETE 3 VIEW COMPARISON:  None Available. FINDINGS: Oblique fracture is present in the distal fibula. No acute bone or soft tissue abnormality is present in the foot. Microvascular calcifications are present. IMPRESSION: 1. Oblique fracture of the distal fibula. Recommend ankle radiographs for further evaluation. 2. No acute bone or soft tissue abnormality in the foot. Electronically Signed   By: San Morelle M.D.   On: 12/14/2021 18:47    Procedures Procedures    Medications Ordered in ED Medications - No data to display  ED Course/ Medical Decision Making/ A&P                            Medical Decision Making Problems Addressed: Closed fracture of distal end of fibula, unspecified fracture morphology, initial encounter: undiagnosed new problem with uncertain prognosis Closed fracture of multiple ribs of right side, initial encounter: acute illness or injury  Amount and/or Complexity of Data Reviewed Radiology: independent interpretation performed.  Risk OTC drugs. Prescription drug management.   Reviewed the records from the urgent care.  I also personally reviewed the images performed at the urgent care.  Patient had a chest x-ray that showed rib fractures and a small pleural effusion.  I reviewed the films and the patient does not have any significant effusion that would require thoracentesis or chest tube.  He does not have any pneumothorax.  Patient does have several rib fractures but this injury occurred a couple days ago.  He has been managing at home without any significant distress.  Patient is not feeling lightheaded.  He is not feeling dizzy.  I do not feel that blood testing is necessary at this time regarding his injury.  I do not feel that CT scan of the chest is necessary as I am not concerned about acute vascular injury.  Discussed with the patient we certainly could do blood tests and CT scans although I do not think it would necessarily change the management of symptoms.  His injury occurred a couple days ago and has been able to manage at home.  I think it is reasonable to treat him with pain medications.  Regarding his ankle injury this does not sound like it occurred with his recent fall.  He did not twist his ankle a couple days ago.  Sounds like this may be more of an occult injury.  We will place him in a cam walker and recommend outpatient follow-up with orthopedics.  Patient and his daughter are comfortable with this plan.        Final Clinical Impression(s) / ED Diagnoses Final diagnoses:  Closed fracture of multiple ribs of right  side, initial encounter  Closed fracture of distal end of fibula, unspecified fracture morphology, initial encounter    Rx / DC Orders ED Discharge Orders          Ordered    HYDROcodone-acetaminophen (NORCO/VICODIN) 5-325 MG tablet  Every 6 hours PRN        12/14/21 2000    docusate sodium (COLACE) 100 MG capsule  Every 12 hours        12/14/21 Allayne Stack, MD 12/14/21 2006

## 2021-12-14 NOTE — ED Triage Notes (Signed)
Pt reported to ED for further evaluation of broken ribs on rt side and "broke leg" after falling in bathroom on Wednesday.

## 2021-12-14 NOTE — ED Provider Notes (Signed)
[place]    CSN: [id number] Arrival date & time: 12/14/21  1710      History   Chief Complaint Chief Complaint  Patient presents with   Fall   Toe Injury    Little left toe   Back Injury    HPI RONDEL EPISCOPO is a 71 y.o. male.   Patient here today with daughter for evaluation of back pain after he fell off a bench. He reports that he stubbed his left 5th toe trying to get to his toilet 2 days ago. When he stubbed his toe he sat down on the bench to get a good look at his toe and in doing so leaned forward at which time the bench flipped from underneath him. He continues to have pain to his right back. He denies any pain in his left foot or ankle due to neuropathy. He does not report fever. He does report that back pain will catch his breath at times.   The history is provided by the patient and a relative.    Past Medical History:  Diagnosis Date   Acute hepatitis 01/26/2019   Acute kidney failure ([place]) 03/30/2018   AKI (acute kidney injury) ([place])    Alcohol use disorder, mild, abuse 10/14/2019   Anginal pain ([place])    Anxiety    Arthritis    SPINE    Arthritis    Cellulitis 03/30/2018   Coronary artery disease    s/p multiple percutaneous interventions, PCI 202 for DMI and DES distal RCA and CFX-OM 2006   Coronary artery disease    Depression    Depression, major, single episode, mild ([place]) 06/15/2008   Qualifier: Diagnosis of  By: Orville Govern, CMA, Carol     GERD (gastroesophageal reflux disease)    Hyperlipidemia    mixed   Hyperlipidemia    Hypertension    Major depressive disorder, single episode 06/15/2008   Overview:  Overview:  Qualifier: Diagnosis of  By: Orville Govern, CMA, Carol    MI (myocardial infarction) ([place])    Myocardial infarction ([place])    ? 2006   Numbness and tingling in right hand 07/13/2019   Peripheral neuropathy    calves and both feet   Peripheral neuropathy    calves and both feet   Puncture wound of right foot 03/07/2020   Rhabdomyolysis  02/02/2019   Severe sepsis with acute organ dysfunction ([place]) 12/03/2016   Spondylitis, ankylosing ([place])     Patient Active Problem List   Diagnosis Date Noted   Syncope 10/16/2021   Chronic bilateral low back pain without sciatica 07/13/2019   Fall at home, initial encounter 02/02/2019   Rash 05/23/2018   Peripheral neuropathy 11/04/2017   Gynecomastia, male 02/18/2017   Healthcare maintenance 01/20/2017   Aortic atherosclerosis ([place]) 12/03/2016   History of cryptosporidiosis    S/P CABG x 4 10/29/2013   Hyperlipidemia 06/15/2008   HTN, goal below 140/90 06/15/2008   CAD (coronary artery disease) 06/15/2008   SPONDYLITIS, ANKYLOSING 06/15/2008   Atherosclerotic heart disease of native coronary artery without angina pectoris 06/15/2008   Gastro-esophageal reflux disease without esophagitis 06/15/2008    Past Surgical History:  Procedure Laterality Date   ACHILLES TENDON REPAIR     CARDIAC CATHETERIZATION     stent RCA June3, 2002, Stent OM/PTCA circumflex August 13, 2000   CATARACT EXTRACTION Bilateral    CORONARY ANGIOPLASTY WITH STENT PLACEMENT     first one in 2006; had another place 3 months ago (August  2013)   CORONARY ANGIOPLASTY WITH STENT PLACEMENT  03/26/2013   RCA           DR COOPER   CORONARY ANGIOPLASTY WITH STENT PLACEMENT     CORONARY ARTERY BYPASS GRAFT N/A 10/29/2013   Procedure: CORONARY ARTERY BYPASS GRAFTING x 4 (LIMA-LAD, SVG-OM, SVG-PD-PL) ENDOSCOPIC VEIN HARVEST RIGHT THIGH;  Surgeon: Gaye Pollack, MD;  Location: [facility];  Service: Open Heart Surgery;  Laterality: N/A;   CORONARY ARTERY BYPASS GRAFT     FINGER SURGERY     5  TH     DIGIT RIGHT HAND   FINGER SURGERY     INTRAOPERATIVE TRANSESOPHAGEAL ECHOCARDIOGRAM N/A 10/29/2013   Procedure: INTRAOPERATIVE TRANSESOPHAGEAL ECHOCARDIOGRAM;  Surgeon: Gaye Pollack, MD;  Location: [facility] OR;  Service: Open Heart Surgery;  Laterality: N/A;   KNEE ARTHROSCOPY W/ LASER  2003   LEFT HEART CATHETERIZATION WITH  CORONARY ANGIOGRAM N/A 10/01/2011   Procedure: LEFT HEART CATHETERIZATION WITH CORONARY ANGIOGRAM;  Surgeon: [name], MD;  Location[facility] LAB;  Service: Cardiovascular;  Laterality: N/A;   LEFT HEART CATHETERIZATION WITH CORONARY ANGIOGRAM N/A 03/26/2013   Procedure: LEFT HEART CATHETERIZATION WITH CORONARY ANGIOGRAM;  Surgeon: Blane Ohara, MD;  Location: [facility] CATH LAB;  Service: Cardiovascular;  Laterality: N/A;   LEFT HEART CATHETERIZATION WITH CORONARY ANGIOGRAM N/A 10/25/2013   Procedure: LEFT HEART CATHETERIZATION WITH CORONARY ANGIOGRAM;  Surgeon: Blane Ohara, MD;  Location: [facility] CATH LAB;  Service: Cardiovascular;  Laterality: N/A;   TONSILLECTOMY         Home Medications    Prior to Admission medications   Medication Sig Start Date End Date Taking? Authorizing Provider  aspirin 81 MG tablet Take 81 mg by mouth every evening.    Yes [provider]  buPROPion (WELLBUTRIN XL) 300 MG 24 hr tablet Take 300 mg by mouth daily.   Yes [provider]  carvedilol (COREG) 12.5 MG tablet TAKE 1 TABLET BY MOUTH  TWICE DAILY WITH MEALS 07/30/21  Yes Ganta, Anupa, DO  hydrochlorothiazide (MICROZIDE) 12.5 MG capsule Take 12.5 mg by mouth daily.   Yes [provider]  IBUPROFEN PO Take by mouth.   Yes [provider]  pantoprazole (PROTONIX) 40 MG tablet Take 1 tablet (40 mg total) by mouth daily. 08/21/21  Yes Lilland, Alana, DO  rosuvastatin (CRESTOR) 20 MG tablet TAKE 1 TABLET BY MOUTH DAILY 11/23/21  Yes Lilland, Alana, DO  sildenafil (REVATIO) 20 MG tablet TAKE 2-5 TABLETS BY MOUTH AS NEEDED PRIOR TO SEXUAL ACTIVITY 12/03/21  Yes Richardson Dopp T, PA-C  Vilazodone HCl (VIIBRYD PO) Take 20 mg by mouth daily.    Yes [provider]  vitamin B-12 (CYANOCOBALAMIN) 1000 MCG tablet Take 1,000 mcg by mouth daily.   Yes [provider]  nitroGLYCERIN (NITROSTAT) 0.4 MG SL tablet Place 1 tablet (0.4 mg total) under the tongue every 5  (five) minutes as needed for chest pain. 10/27/20 08/21/21  Liliane Shi, PA-C    Family History Family History  Problem Relation Age of Onset   Breast cancer Mother 32   Heart disease Father    Cancer Brother    Heart disease Brother    Heart attack Brother    Leukemia Maternal Grandmother    Heart disease Maternal Grandfather    Heart disease Other        positive for cardiac disease and both brothers have had cardiac events   Allergic rhinitis Neg Hx    Angioedema  Neg Hx    Asthma Neg Hx    Atopy Neg Hx    Immunodeficiency Neg Hx    Eczema Neg Hx    Urticaria Neg Hx    Colon cancer Neg Hx    Colon polyps Neg Hx    Esophageal cancer Neg Hx    Rectal cancer Neg Hx    Stomach cancer Neg Hx     Social History Social History   Tobacco Use   Smoking status: Former    Packs/day: 2.00    Years: 10.00    Total pack years: 20.00    Types: Cigarettes    Quit date: 06/30/1979    Years since quitting: 42.4   Smokeless tobacco: Never   Tobacco comments:    no plans to start back  Vaping Use   Vaping Use: Never used  Substance Use Topics   Alcohol use: Yes    Alcohol/week: 14.0 standard drinks of alcohol    Types: 12 Glasses of wine, 2 Shots of liquor per week    Comment: 2-3 glasses of wine at dinner everyday.   Drug use: No     Allergies   Patient has no known allergies.   Review of Systems Review of Systems  Constitutional:  Negative for chills and fever.  Eyes:  Negative for discharge and redness.  Respiratory:  Negative for shortness of breath.   Cardiovascular:  Positive for chest pain (chest wall pain- posterior right).  Gastrointestinal:  Positive for constipation. Negative for abdominal pain and blood in stool.  Genitourinary:  Negative for dysuria and hematuria.  Musculoskeletal:  Positive for back pain and myalgias.  Skin:  Positive for wound.  Neurological:  Positive for numbness (neuropathy at baseline).     Physical Exam Triage Vital  Signs ED Triage Vitals [12/14/21 1805]  Enc Vitals Group     BP      Pulse      Resp 20     Temp      Temp Source Oral     SpO2      Weight      Height      Head Circumference      Peak Flow      Pain Score      Pain Loc      Pain Edu?      Excl. in [place]?    No data found.  Updated Vital Signs BP (!) 186/101 (BP Location: Left Arm)   Pulse 64   Temp 98.2 F (36.8 C) (Oral)   Resp 20   SpO2 97%      Physical Exam Vitals and nursing note reviewed.  Constitutional:      General: He is not in acute distress.    Appearance: Normal appearance. He is not ill-appearing.  HENT:     Head: Normocephalic and atraumatic.  Eyes:     Conjunctiva/sclera: Conjunctivae normal.  Cardiovascular:     Rate and Rhythm: Normal rate.  Pulmonary:     Comments: Patient is noted to be winded when walking to and from xray Musculoskeletal:     Comments: Patient ambulates without difficulty. No TTP to left lateral foot, mild swelling to left lower leg, left 5th toe, erythema surrounding superficial wound to left lateral 5th toe  Skin:    Capillary Refill: Normal cap refill to left 5th toe    Comments: Significant ecchymosis noted to right low thoracic and lumbar back, TTP  noted to same  Neurological:  Mental Status: He is alert.     Comments: Sensation diminished to left lateral foot, 4th and 5th toes  Psychiatric:        Mood and Affect: Mood normal.        Behavior: Behavior normal.        Thought Content: Thought content normal.      UC Treatments / Results  Labs (all labs ordered are listed, but only abnormal results are displayed) Labs Reviewed - No data to display  EKG   Radiology DG Ribs Unilateral W/Chest Right  Result Date: 12/14/2021 CLINICAL DATA:  Injury. EXAM: RIGHT RIBS AND CHEST - 3+ VIEW COMPARISON:  Chest x-ray 12/03/2016 FINDINGS: There are acute lateral right eighth, ninth and tenth rib fractures. The eighth and ninth rib fractures are displaced. There is  some mild chest wall emphysema in a small right pleural effusion. No pneumothorax visualized. The cardiomediastinal silhouette is stable. Sternotomy wires are again seen. No focal lung infiltrate. IMPRESSION: 1. Acute lateral right eighth, ninth and tenth rib fractures. (Ninth and tenth rib fractures are displaced) 2. Small right pleural effusion. 3. No pneumothorax. 4. Mild chest wall emphysema. Electronically Signed   By: Ronney Asters M.D.   On: 12/14/2021 18:49   DG Foot Complete Left  Result Date: 12/14/2021 CLINICAL DATA:  Injury. EXAM: LEFT FOOT - COMPLETE 3 VIEW COMPARISON:  None Available. FINDINGS: Oblique fracture is present in the distal fibula. No acute bone or soft tissue abnormality is present in the foot. Microvascular calcifications are present. IMPRESSION: 1. Oblique fracture of the distal fibula. Recommend ankle radiographs for further evaluation. 2. No acute bone or soft tissue abnormality in the foot. Electronically Signed   By: San Morelle M.D.   On: 12/14/2021 18:47    Procedures Procedures (including critical care time)  Medications Ordered in UC Medications - No data to display  Initial Impression / Assessment and Plan / UC Course  I have reviewed the triage vital signs and the nursing notes.  Pertinent labs & imaging results that were available during my care of the patient were reviewed by me and considered in my medical decision making (see chart for details).    Xrays reveal multiple concerning fractures. Once patient is aware of fracture to distal fibula he reports he has had swelling there for 2 weeks but no pain. He denies pain with ambulation. Recommended further evaluation in the ED- patient is agreeable to same. Daughter will drive him- he will remain non-weight bearing during the transfer- wheel chair used to get to car- advised to request wheel chair once he arrives at ED.   Final Clinical Impressions(s) / UC Diagnoses   Final diagnoses:  Closed  traumatic displaced fracture of multiple ribs of right side  Pleural effusion on right  Closed fracture of distal end of left fibula, unspecified fracture morphology, initial encounter   Discharge Instructions   None    ED Prescriptions   None    PDMP not reviewed this encounter.   Francene Finders, PA-C 12/14/21 1935

## 2021-12-14 NOTE — ED Triage Notes (Signed)
Patient fell after stubbing his toe on a wooden bench in his bathroom. This started to bleed so he hat to look at it. The bench flipped from under him and he fell, hurting his back. Onset Wednesday.  States had some pin point sharp pain in the upper back (right low scapula and lower right back).

## 2021-12-17 ENCOUNTER — Telehealth: Payer: Self-pay

## 2021-12-17 NOTE — Patient Outreach (Signed)
  Care Coordination [facility] Note Transition Care Management Follow-up Telephone Call Date of discharge and from where: [facility] 12/14/21 How have you been since you were released from the hospital? "The first couple of days were tough but I am better today". Any questions or concerns? No  Items Reviewed: Did the pt receive and understand the discharge instructions provided? Yes  Medications obtained and verified? Yes  Other? No  Any new allergies since your discharge? No  Dietary orders reviewed? No Do you have support at home? Yes   Home Care and Equipment/Supplies: Were home health services ordered? no If so, what is the name of the agency? N/A  Has the agency set up a time to come to the patient's home? no Were any new equipment or medical supplies ordered?  No What is the name of the medical supply agency? N/A Were you able to get the supplies/equipment? not applicable Do you have any questions related to the use of the equipment or supplies? No  Functional Questionnaire: (I = Independent and D = Dependent) ADLs: I  Bathing/Dressing- I  Meal Prep- I  Eating- I  Maintaining continence- I  Transferring/Ambulation- I  Managing Meds- I  Follow up appointments reviewed:  PCP Hospital f/u appt confirmed? No  Specialist Hospital f/u appt confirmed? No  Patient calling Ortho today Are transportation arrangements needed? No  If their condition worsens, is the pt aware to call PCP or go to the Emergency Dept.? Yes Was the patient provided with contact information for the PCP's office or ED? Yes Was to pt encouraged to call back with questions or concerns? Yes  SDOH assessments and interventions completed:   Yes  Care Coordination Interventions Activated:  Yes   Care Coordination Interventions:  No Care Coordination interventions needed at this time.   Encounter Outcome:  Pt. Visit Completed

## 2021-12-18 ENCOUNTER — Telehealth: Payer: Self-pay

## 2021-12-18 NOTE — Telephone Encounter (Signed)
        Patient  visited [place] on 10/6     Telephone encounter attempt :  1st  A HIPAA compliant voice message was left requesting a return call.  Instructed patient to call back   Mertha Clyatt Pop Health Care Guide, [place], Care Management  [phone] [address], [place], [place] [address] Phone: [phone] Email: [email or web address]       

## 2021-12-19 ENCOUNTER — Ambulatory Visit (INDEPENDENT_AMBULATORY_CARE_PROVIDER_SITE_OTHER): Payer: Medicare Other

## 2021-12-19 ENCOUNTER — Ambulatory Visit (INDEPENDENT_AMBULATORY_CARE_PROVIDER_SITE_OTHER): Payer: Medicare Other | Admitting: Orthopaedic Surgery

## 2021-12-19 DIAGNOSIS — M25572 Pain in left ankle and joints of left foot: Secondary | ICD-10-CM | POA: Diagnosis not present

## 2021-12-19 DIAGNOSIS — S82432A Displaced oblique fracture of shaft of left fibula, initial encounter for closed fracture: Secondary | ICD-10-CM | POA: Diagnosis not present

## 2021-12-19 NOTE — Progress Notes (Signed)
Chief Complaint: Left ankle fracture     History of Present Illness:    Gregory Hansen is a 71 y.o. male presents today for follow-up after a fall on October 14, 2021.  He states that he initially had some pain and swelling about the ankle although he was able to bear weight on it and subsequently did not present for follow-up at that time.  He recently had a fall in which he broke several ribs.  Foot x-ray was obtained which did show a nondisplaced fibular fracture.  He is here today for further assessment.  He was placed in a cam boot.  He does not like this.  He denies any history of diabetes.    Surgical History:   None  PMH/PSH/Family History/Social History/Meds/Allergies:    Past Medical History:  Diagnosis Date   Acute hepatitis 01/26/2019   Acute kidney failure ([place]) 03/30/2018   AKI (acute kidney injury) ([place])    Alcohol use disorder, mild, abuse 10/14/2019   Anginal pain ([place])    Anxiety    Arthritis    SPINE    Arthritis    Cellulitis 03/30/2018   Coronary artery disease    s/p multiple percutaneous interventions, PCI 202 for DMI and DES distal RCA and CFX-OM 2006   Coronary artery disease    Depression    Depression, major, single episode, mild ([place]) 06/15/2008   Qualifier: Diagnosis of  By: Orville Govern, CMA, Carol     GERD (gastroesophageal reflux disease)    Hyperlipidemia    mixed   Hyperlipidemia    Hypertension    Major depressive disorder, single episode 06/15/2008   Overview:  Overview:  Qualifier: Diagnosis of  By: Orville Govern, CMA, Carol    MI (myocardial infarction) ([place])    Myocardial infarction ([place])    ? 2006   Numbness and tingling in right hand 07/13/2019   Peripheral neuropathy    calves and both feet   Peripheral neuropathy    calves and both feet   Puncture wound of right foot 03/07/2020   Rhabdomyolysis 02/02/2019   Severe sepsis with acute organ dysfunction ([place]) 12/03/2016   Spondylitis, ankylosing ([place])    Past  Surgical History:  Procedure Laterality Date   ACHILLES TENDON REPAIR     CARDIAC CATHETERIZATION     stent RCA June3, 2002, Stent OM/PTCA circumflex August 13, 2000   CATARACT EXTRACTION Bilateral    CORONARY ANGIOPLASTY WITH STENT PLACEMENT     first one in 2006; had another place 3 months ago (August 2013)   CORONARY ANGIOPLASTY WITH STENT PLACEMENT  03/26/2013   RCA           DR COOPER   CORONARY ANGIOPLASTY WITH STENT PLACEMENT     CORONARY ARTERY BYPASS GRAFT N/A 10/29/2013   Procedure: CORONARY ARTERY BYPASS GRAFTING x 4 (LIMA-LAD, SVG-OM, SVG-PD-PL) ENDOSCOPIC VEIN HARVEST RIGHT THIGH;  Surgeon: Gaye Pollack, MD;  Location: [place];  Service: Open Heart Surgery;  Laterality: N/A;   CORONARY ARTERY BYPASS GRAFT     FINGER SURGERY     5  TH     DIGIT RIGHT HAND   FINGER SURGERY     INTRAOPERATIVE TRANSESOPHAGEAL ECHOCARDIOGRAM N/A 10/29/2013   Procedure: INTRAOPERATIVE TRANSESOPHAGEAL ECHOCARDIOGRAM;  Surgeon: Gaye Pollack, MD;  Location: [place];  Service:  Open Heart Surgery;  Laterality: N/A;   KNEE ARTHROSCOPY W/ LASER  2003   LEFT HEART CATHETERIZATION WITH CORONARY ANGIOGRAM N/A 10/01/2011   Procedure: LEFT HEART CATHETERIZATION WITH CORONARY ANGIOGRAM;  Surgeon: [name], MD;  [facility];  Service: Cardiovascular;  Laterality: N/A;   LEFT HEART CATHETERIZATION WITH CORONARY ANGIOGRAM N/A 03/26/2013   Procedure: LEFT HEART CATHETERIZATION WITH CORONARY ANGIOGRAM;  Surgeon: Blane Ohara, MD;  Location: [facility] CATH LAB;  Service: Cardiovascular;  Laterality: N/A;   LEFT HEART CATHETERIZATION WITH CORONARY ANGIOGRAM N/A 10/25/2013   Procedure: LEFT HEART CATHETERIZATION WITH CORONARY ANGIOGRAM;  Surgeon: Blane Ohara, MD;  Location: [facility] CATH LAB;  Service: Cardiovascular;  Laterality: N/A;   TONSILLECTOMY     Social History   Socioeconomic History   Marital status: Divorced    Spouse name: n/a   Number of children: 2   Years of education: Masters degree    Highest education level: Master's degree (e.g., MA, MS, MEng, MEd, MSW, MBA)  Occupational History   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   Drug use: No   Sexual activity: Not Currently    Birth control/protection: Condom  Other Topics Concern   Not on file  Social History Narrative   ** Merged History Encounter **       Lives in a one level home with son, and dog, Scientist, physiological. Works out of the house. 2 steps into house. Smoke alarms in house. No grab bars in bathroom. Has some area rugs and throw rugs with backing.      No pets of his own. Restores antique pens, does like to walk and cycle but having problems with peripheral neuropathy.       Firearm is safely stored.      Wears seat belts in vehicle. Does use sunblock and tries to avoid sun due to vitiligo and previous sunburns.      Eats varied diet. Likes protein, fresh fruits and veggies. Watches fats, saturated fats. Drink a lot of water.    Social Determinants of Health   Financial Resource Strain: Low Risk  (12/18/2017)   Overall Financial Resource Strain (CARDIA)    Difficulty of Paying Living Expenses: Not hard at all  Food Insecurity: No Food Insecurity (12/17/2021)   Hunger Vital Sign    Worried About Running Out of Food in the Last Year: Never true    Ran Out of Food in the Last Year: Never true  Transportation Needs: No Transportation Needs (12/17/2021)   PRAPARE - Hydrologist  (Medical): No    Lack of Transportation (Non-Medical): No  Physical Activity: Inactive (12/18/2017)   Exercise Vital Sign    Days of Exercise per Week: 0 days    Minutes of Exercise per Session: 0 min  Stress:  No Stress Concern Present (12/18/2017)   [place]    Feeling of Stress : Not at all  Social Connections: Moderately Isolated (12/18/2017)   Social Connection and Isolation Panel [NHANES]    Frequency of Communication with Friends and Family: Three times a week    Frequency of Social Gatherings with Friends and Family: Once a week    Attends Religious Services: Never    Marine scientist or Organizations: No    Attends Music therapist: Never    Marital Status: Divorced   Family History  Problem Relation Age of Onset   Breast cancer Mother 29   Heart disease Father    Cancer Brother    Heart disease Brother    Heart attack Brother    Leukemia Maternal Grandmother    Heart disease Maternal Grandfather    Heart disease Other        positive for cardiac disease and both brothers have had cardiac events   Allergic rhinitis Neg Hx    Angioedema Neg Hx    Asthma Neg Hx    Atopy Neg Hx    Immunodeficiency Neg Hx    Eczema Neg Hx    Urticaria Neg Hx    Colon cancer Neg Hx    Colon polyps Neg Hx    Esophageal cancer Neg Hx    Rectal cancer Neg Hx    Stomach cancer Neg Hx    No Known Allergies Current Outpatient Medications  Medication Sig Dispense Refill   aspirin 81 MG tablet Take 81 mg by mouth every evening.      buPROPion (WELLBUTRIN XL) 300 MG 24 hr tablet Take 300 mg by mouth daily.     carvedilol (COREG) 12.5 MG tablet TAKE 1 TABLET BY MOUTH  TWICE DAILY WITH MEALS 200 tablet 2   docusate sodium (COLACE) 100 MG capsule Take 1 capsule (100 mg total) by mouth every 12 (twelve) hours. 20 capsule 0   hydrochlorothiazide (MICROZIDE) 12.5 MG capsule Take 12.5 mg by mouth daily.      HYDROcodone-acetaminophen (NORCO/VICODIN) 5-325 MG tablet Take 1 tablet by mouth every 6 (six) hours as needed. 12 tablet 0   IBUPROFEN PO Take by mouth.     nitroGLYCERIN (NITROSTAT) 0.4 MG SL tablet Place 1 tablet (0.4 mg total) under the tongue every 5 (five) minutes as needed for chest pain. 30 tablet 1   pantoprazole (PROTONIX) 40 MG tablet Take 1 tablet (40 mg total) by mouth daily. 90 tablet 2   rosuvastatin (CRESTOR) 20 MG tablet TAKE 1 TABLET BY MOUTH DAILY 100 tablet 2   sildenafil (REVATIO) 20 MG tablet TAKE 2-5 TABLETS BY MOUTH AS NEEDED PRIOR TO SEXUAL ACTIVITY 50 tablet 3   Vilazodone HCl (VIIBRYD PO) Take 20 mg by mouth daily.      vitamin B-12 (CYANOCOBALAMIN) 1000 MCG tablet Take 1,000 mcg by mouth daily.     No current facility-administered medications for this visit.   No results found.  Review of Systems:   A ROS was performed including pertinent positives and negatives as documented in the HPI.  Physical Exam :   Constitutional: NAD and appears stated age Neurological: Alert and oriented Psych: Appropriate affect and cooperative There were no vitals taken for this visit.   Comprehensive Musculoskeletal Exam:    Tenderness palpation about the distal fibula.  Sensation is intact in all distributions distally.  2+ dorsalis pedis pulse.  Range of motion is  from 10 degrees dorsiflexion 30 is plantarflexion  Imaging:   Xray (3 views left ankle): Essentially nondisplaced Weber B fibula fracture with congruent mortise joint    I personally reviewed and interpreted the radiographs.   Assessment:   71 y.o. male with a nondisplaced left fibula fracture.  He has been walking on this for the last 2 months.  To that effect I do believe he can continue to mobilize and walk on this without any type of restriction.  He can get into a normal shoe at this time.  I will see him back in 2 months for final x-rays  Plan :    -Return to clinic in 2 months for final  x-rays     I personally saw and evaluated the patient, and participated in the management and treatment plan.  Vanetta Mulders, MD Attending Physician, Orthopedic Surgery  This document was dictated using Dragon voice recognition software. A reasonable attempt at proof reading has been made to minimize errors.

## 2022-01-03 ENCOUNTER — Encounter: Payer: Self-pay | Admitting: Family Medicine

## 2022-02-11 ENCOUNTER — Other Ambulatory Visit: Payer: Self-pay | Admitting: Physician Assistant

## 2022-02-11 DIAGNOSIS — H35033 Hypertensive retinopathy, bilateral: Secondary | ICD-10-CM | POA: Diagnosis not present

## 2022-02-11 DIAGNOSIS — H524 Presbyopia: Secondary | ICD-10-CM | POA: Diagnosis not present

## 2022-02-11 DIAGNOSIS — H26493 Other secondary cataract, bilateral: Secondary | ICD-10-CM | POA: Diagnosis not present

## 2022-02-11 DIAGNOSIS — I10 Essential (primary) hypertension: Secondary | ICD-10-CM | POA: Diagnosis not present

## 2022-02-11 DIAGNOSIS — H43813 Vitreous degeneration, bilateral: Secondary | ICD-10-CM | POA: Diagnosis not present

## 2022-02-20 ENCOUNTER — Ambulatory Visit (INDEPENDENT_AMBULATORY_CARE_PROVIDER_SITE_OTHER): Payer: Medicare Other

## 2022-02-20 ENCOUNTER — Ambulatory Visit (INDEPENDENT_AMBULATORY_CARE_PROVIDER_SITE_OTHER): Payer: Medicare Other | Admitting: Orthopaedic Surgery

## 2022-02-20 DIAGNOSIS — S82432A Displaced oblique fracture of shaft of left fibula, initial encounter for closed fracture: Secondary | ICD-10-CM | POA: Diagnosis not present

## 2022-02-20 DIAGNOSIS — M25572 Pain in left ankle and joints of left foot: Secondary | ICD-10-CM

## 2022-02-20 NOTE — Progress Notes (Signed)
Chief Complaint: Left ankle fracture     History of Present Illness:   02/20/2022: Presents today for follow-up of his left ankle.  He is now walking without any limp.  He has no pain.  Overall feeling much better.  He is here today for further assessment.  Gregory Hansen is a 71 y.o. male presents today for follow-up after a fall on October 14, 2021.  He states that he initially had some pain and swelling about the ankle although he was able to bear weight on it and subsequently did not present for follow-up at that time.  He recently had a fall in which he broke several ribs.  Foot x-ray was obtained which did show a nondisplaced fibular fracture.  He is here today for further assessment.  He was placed in a cam boot.  He does not like this.  He denies any history of diabetes.    Surgical History:   None  PMH/PSH/Family History/Social History/Meds/Allergies:    Past Medical History:  Diagnosis Date   Acute hepatitis 01/26/2019   Acute kidney failure ([place]) 03/30/2018   AKI (acute kidney injury) ([place])    Alcohol use disorder, mild, abuse 10/14/2019   Anginal pain ([place])    Anxiety    Arthritis    SPINE    Arthritis    Cellulitis 03/30/2018   Coronary artery disease    s/p multiple percutaneous interventions, PCI 202 for DMI and DES distal RCA and CFX-OM 2006   Coronary artery disease    Depression    Depression, major, single episode, mild ([place]) 06/15/2008   Qualifier: Diagnosis of  By: Orville Govern, CMA, Carol     GERD (gastroesophageal reflux disease)    Hyperlipidemia    mixed   Hyperlipidemia    Hypertension    Major depressive disorder, single episode 06/15/2008   Overview:  Overview:  Qualifier: Diagnosis of  By: Orville Govern, CMA, Carol    MI (myocardial infarction) ([place])    Myocardial infarction ([place])    ? 2006   Numbness and tingling in right hand 07/13/2019   Peripheral neuropathy    calves and both feet   Peripheral neuropathy    calves and both  feet   Puncture wound of right foot 03/07/2020   Rhabdomyolysis 02/02/2019   Severe sepsis with acute organ dysfunction ([place]) 12/03/2016   Spondylitis, ankylosing ([place])    Past Surgical History:  Procedure Laterality Date   ACHILLES TENDON REPAIR     CARDIAC CATHETERIZATION     stent RCA June3, 2002, Stent OM/PTCA circumflex August 13, 2000   CATARACT EXTRACTION Bilateral    CORONARY ANGIOPLASTY WITH STENT PLACEMENT     first one in 2006; had another place 3 months ago (August 2013)   CORONARY ANGIOPLASTY WITH STENT PLACEMENT  03/26/2013   RCA           DR COOPER   CORONARY ANGIOPLASTY WITH STENT PLACEMENT     CORONARY ARTERY BYPASS GRAFT N/A 10/29/2013   Procedure: CORONARY ARTERY BYPASS GRAFTING x 4 (LIMA-LAD, SVG-OM, SVG-PD-PL) ENDOSCOPIC VEIN HARVEST RIGHT THIGH;  Surgeon: Gaye Pollack, MD;  Location: [place];  Service: Open Heart Surgery;  Laterality: N/A;   CORONARY ARTERY BYPASS GRAFT     FINGER SURGERY     5  TH  DIGIT RIGHT HAND   FINGER SURGERY     INTRAOPERATIVE TRANSESOPHAGEAL ECHOCARDIOGRAM N/A 10/29/2013   Procedure: INTRAOPERATIVE TRANSESOPHAGEAL ECHOCARDIOGRAM;  Surgeon: Gaye Pollack, MD;  Location: [facility] OR;  Service: Open Heart Surgery;  Laterality: N/A;   KNEE ARTHROSCOPY W/ LASER  2003   LEFT HEART CATHETERIZATION WITH CORONARY ANGIOGRAM N/A 10/01/2011   Procedure: LEFT HEART CATHETERIZATION WITH CORONARY ANGIOGRAM;  Surgeon: [name], MD;  Location: [facility] CATH LAB;  Service: Cardiovascular;  Laterality: N/A;   LEFT HEART CATHETERIZATION WITH CORONARY ANGIOGRAM N/A 03/26/2013   Procedure: LEFT HEART CATHETERIZATION WITH CORONARY ANGIOGRAM;  Surgeon: Blane Ohara, MD;  Location[facility] C[facility] LAB;  Service: Cardiovascular;  Laterality: N/A;   LEFT HEART CATHETERIZATION WITH CORONARY ANGIOGRAM N/A 10/25/2013   Procedure: LEFT HEART CATHETERIZATION WITH CORONARY ANGIOGRAM;  Surgeon: Blane Ohara, MD;  Location: [facility] C[facility] LAB;  Service: Cardiovascular;  Laterality:  N/A;   TONSILLECTOMY     Social History   Socioeconomic History   Marital status: Divorced    Spouse name: n/a   Number of children: 2   Years of education: Masters degree   Highest education level: Master's degree (e.g., MA, MS, MEng, MEd, MSW, MBA)  Occupational History   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   Drug use: No   Sexual activity: Not Currently    Birth control/protection: Condom  Other Topics Concern   Not on file  Social History Narrative   ** Merged History Encounter **       Lives in a one level home with son, and dog, Scientist, physiological. Works out of the house. 2 steps into house. Smoke alarms in house. No grab bars in bathroom. Has some area rugs and throw rugs with backing.      No pets of his own. Restores antique pens, does like to walk and cycle but having problems with peripheral neuropathy.       Firearm is safely stored.      Wears seat belts in vehicle. Does use sunblock and tries to avoid sun due to vitiligo and previous sunburns.      Eats varied diet. Likes protein, fresh fruits and veggies. Watches fats, saturated fats. Drink a lot of water.    Social Determinants of Health   Financial Resource Strain: Low Risk  (12/18/2017)   Overall Financial Resource Strain (CARDIA)    Difficulty of Paying Living Expenses: Not hard at all  Food Insecurity: No Food Insecurity (12/17/2021)   Hunger Vital Sign    Worried About Running Out of Food in the  Last Year: Never true    Ran Out of Food in the Last Year: Never true  Transportation Needs: No Transportation Needs (12/17/2021)   PRAPARE - Hydrologist (Medical): No    Lack of Transportation (  Non-Medical): No  Physical Activity: Inactive (12/18/2017)   Exercise Vital Sign    Days of Exercise per Week: 0 days    Minutes of Exercise per Session: 0 min  Stress: No Stress Concern Present (12/18/2017)   [place]    Feeling of Stress : Not at all  Social Connections: Moderately Isolated (12/18/2017)   Social Connection and Isolation Panel [NHANES]    Frequency of Communication with Friends and Family: Three times a week    Frequency of Social Gatherings with Friends and Family: Once a week    Attends Religious Services: Never    Marine scientist or Organizations: No    Attends Music therapist: Never    Marital Status: Divorced   Family History  Problem Relation Age of Onset   Breast cancer Mother 79   Heart disease Father    Cancer Brother    Heart disease Brother    Heart attack Brother    Leukemia Maternal Grandmother    Heart disease Maternal Grandfather    Heart disease Other        positive for cardiac disease and both brothers have had cardiac events   Allergic rhinitis Neg Hx    Angioedema Neg Hx    Asthma Neg Hx    Atopy Neg Hx    Immunodeficiency Neg Hx    Eczema Neg Hx    Urticaria Neg Hx    Colon cancer Neg Hx    Colon polyps Neg Hx    Esophageal cancer Neg Hx    Rectal cancer Neg Hx    Stomach cancer Neg Hx    No Known Allergies Current Outpatient Medications  Medication Sig Dispense Refill   aspirin 81 MG tablet Take 81 mg by mouth every evening.      buPROPion (WELLBUTRIN XL) 300 MG 24 hr tablet Take 300 mg by mouth daily.     carvedilol (COREG) 12.5 MG tablet TAKE 1 TABLET BY MOUTH  TWICE DAILY WITH MEALS 200 tablet 2   docusate sodium  (COLACE) 100 MG capsule Take 1 capsule (100 mg total) by mouth every 12 (twelve) hours. 20 capsule 0   hydrochlorothiazide (MICROZIDE) 12.5 MG capsule Take 12.5 mg by mouth daily.     HYDROcodone-acetaminophen (NORCO/VICODIN) 5-325 MG tablet Take 1 tablet by mouth every 6 (six) hours as needed. 12 tablet 0   IBUPROFEN PO Take by mouth.     nitroGLYCERIN (NITROSTAT) 0.4 MG SL tablet DISSOLVE 1 TAB UNDER TONGUE FOR CHEST PAIN - IF PAIN REMAINS AFTER 5 MIN, CALL 911 AND REPEAT DOSE. MAX 3 TABS IN 15 MINUTES. Please call [phone] to schedule an appointment for future refills. Thank you. 1st attempt. 25 tablet 0   pantoprazole (PROTONIX) 40 MG tablet Take 1 tablet (40 mg total) by mouth daily. 90 tablet 2   rosuvastatin (CRESTOR) 20 MG tablet TAKE 1 TABLET BY MOUTH DAILY 100 tablet 2   sildenafil (REVATIO) 20 MG tablet TAKE 2-5 TABLETS BY MOUTH AS NEEDED PRIOR TO SEXUAL ACTIVITY 50 tablet 3   Vilazodone HCl (VIIBRYD PO) Take 20 mg by mouth daily.      vitamin B-12 (CYANOCOBALAMIN) 1000 MCG tablet Take 1,000 mcg by mouth daily.     No current facility-administered medications for this visit.   No results found.  Review of Systems:   A ROS was performed including pertinent positives and negatives as documented in the HPI.  Physical Exam :  Constitutional: NAD and appears stated age Neurological: Alert and oriented Psych: Appropriate affect and cooperative There were no vitals taken for this visit.   Comprehensive Musculoskeletal Exam:    Np tenderness palpation about the distal fibula.  Sensation is intact in all distributions distally.  2+ dorsalis pedis pulse.  Ankle range of motion is normal as compared to the contralateral side with no swelling  Imaging:   Xray (3 views left ankle): Essentially nondisplaced Weber B fibula fracture with congruent mortise joint, there is some callus formation on x-rays today    I personally reviewed and interpreted the radiographs.   Assessment:    71 y.o. male with a nondisplaced left fibula fracture.  Overall he does have significant callus formation compared to the previous x-ray although this is slowly forming.  At this time he will continue to be active as tolerated.  I have not provided any specific restrictions.  I will plan to see him back in 3 months  Plan :    -Return to clinic in 3 months for final x-rays     I personally saw and evaluated the patient, and participated in the management and treatment plan.  Vanetta Mulders, MD Attending Physician, Orthopedic Surgery  This document was dictated using Dragon voice recognition software. A reasonable attempt at proof reading has been made to minimize errors.  To patient's 6-week.

## 2022-02-21 ENCOUNTER — Encounter: Payer: Self-pay | Admitting: Family Medicine

## 2022-02-21 ENCOUNTER — Ambulatory Visit: Payer: Medicare Other | Admitting: Family Medicine

## 2022-02-21 VITALS — BP 145/82 | HR 75 | Ht 74.0 in | Wt 253.4 lb

## 2022-02-21 DIAGNOSIS — R0989 Other specified symptoms and signs involving the circulatory and respiratory systems: Secondary | ICD-10-CM | POA: Diagnosis not present

## 2022-02-21 DIAGNOSIS — I10 Essential (primary) hypertension: Secondary | ICD-10-CM

## 2022-02-21 NOTE — Progress Notes (Signed)
    SUBJECTIVE:   CHIEF COMPLAINT / HPI:   HTN: - Medications: HCTZ 12.'5mg'$   - Compliance: Good - Checking BP at home: Yes, ranges from 100s-160s/60s-80s. Average in the 130s/70s - Home BP cuff seems to be reading slightly higher diastolic numbers in our clinic number -Does report that he has some symptoms of hypotension when his blood pressures are on the lower 100s.  Phlegm - Uses nasal saline spray which helps with dryness - Seems to have some issues with dry mouth - Wanting to make sure Mucinex is safe to use  Abnormal foot movement - Notes that his toes will sometimes curl downwards when taking a step but will straighten out before his foot is on the ground - Does not cause any balance issues - Is not painful or any, cramping sensation   PERTINENT  PMH / PSH: Reviewed  OBJECTIVE:   BP (!) 145/82   Pulse 75   Ht '6\' 2"'$  (1.88 m)   Wt 253 lb 6.4 oz (114.9 kg)   SpO2 100%   BMI 32.53 kg/m   General: NAD, well-appearing, well-nourished Respiratory: No respiratory distress, breathing comfortably, able to speak in full sentences Skin: warm and dry, no rashes noted on exposed skin Psych: Appropriate affect and mood Right Foot: chronic skin changes present, non-TTP in the plantar region, no obvious abnormality, full ROM of the foot and toes  ASSESSMENT/PLAN:   HTN, goal below 140/90 BP slightly above goal in office today 145/82.  Home measurements typically average in the 130s over 70s.  Has hypotensive symptoms with the lower measurements that have happened in the 1 teens. - Goal of avoiding hypotensive symptoms - Continue HCTZ 12.5 mg daily and Coreg 12.5 mg twice daily - Continue to monitor, adjust medications if hypotensive symptoms persist   Phlegm production Mucinex alone should be safe for patient, avoid DM medication variations  Abnormal foot movement No concerns at this time as it is not affecting ambulation or ability to maintain balance. - Continue to  monitor   Rise Patience, [place]

## 2022-02-21 NOTE — Assessment & Plan Note (Signed)
BP slightly above goal in office today 145/82.  Home measurements typically average in the 130s over 70s.  Has hypotensive symptoms with the lower measurements that have happened in the 1 teens. - Goal of avoiding hypotensive symptoms - Continue HCTZ 12.5 mg daily and Coreg 12.5 mg twice daily - Continue to monitor, adjust medications if hypotensive symptoms persist

## 2022-02-21 NOTE — Patient Instructions (Signed)
Your blood pressure is still a little bit up in clinic, but I would rather it be a little high than you feeling symptomatic with the lower numbers so we will not adjust any of your medications at this time.   Let's keep an eye on the toe symptoms, I don't think there is anything to do at this time unless it starts to impact your balance or your ability to walk and brace yourself.   You should be able to use regular mucinex, just don't use the mucinex DM.

## 2022-04-18 NOTE — Progress Notes (Signed)
Office Visit Note  Patient: Gregory Hansen             Date of Birth: 1950-11-20           MRN: [id number]             PCP: Rise Patience, DO Referring: Rise Patience, DO Visit Date: 05/02/2022 Occupation: @GUAROCC$ @  Subjective:  Pain in joints  History of Present Illness: Gregory PALAZZOLO is a 72 y.o. male with history of ankylosing spondylitis, osteoarthritis and degenerative disc disease.  He states he has been off Humira for a year now.  He notices increased discomfort in his joints since he has been off Humira.  Although he does not want to take immunosuppressive agents.  He states the pain is manageable.  He has been active and doing exercises on a daily basis.  He is also practicing chair yoga which has been helpful.  He denies any history of inflammatory arthritis, Planter fasciitis or Achilles tendinitis.   Activities of Daily Living:  Patient reports morning stiffness for 0 minute.   Patient Denies nocturnal pain.  Difficulty dressing/grooming: Denies Difficulty climbing stairs: Denies Difficulty getting out of chair: Denies Difficulty using hands for taps, buttons, cutlery, and/or writing: Denies  Review of Systems  Constitutional:  Positive for fatigue.  HENT:  Positive for mouth dryness.   Eyes:  Positive for dryness.  Respiratory: Negative.  Negative for shortness of breath.   Cardiovascular: Negative.  Negative for chest pain and palpitations.  Gastrointestinal:  Positive for constipation. Negative for blood in stool and diarrhea.  Endocrine: Positive for increased urination.  Genitourinary: Negative.  Negative for involuntary urination.  Musculoskeletal:  Positive for gait problem. Negative for joint pain, joint pain, joint swelling, myalgias, muscle weakness, morning stiffness, muscle tenderness and myalgias.  Skin:  Positive for sensitivity to sunlight. Negative for pallor and hair loss.  Allergic/Immunologic: Negative.  Negative for susceptible to infections.   Neurological:  Positive for dizziness. Negative for headaches.  Hematological: Negative.  Negative for swollen glands.  Psychiatric/Behavioral:  Positive for sleep disturbance. Negative for depressed mood. The patient is not nervous/anxious.     PMFS History:  Patient Active Problem List   Diagnosis Date Noted   Chronic bilateral low back pain without sciatica 07/13/2019   Peripheral neuropathy 11/04/2017   Gynecomastia, male 02/18/2017   Healthcare maintenance 01/20/2017   Aortic atherosclerosis ([place]) 12/03/2016   History of cryptosporidiosis    S/P CABG x 4 10/29/2013   Hyperlipidemia 06/15/2008   HTN, goal below 140/90 06/15/2008   CAD (coronary artery disease) 06/15/2008   SPONDYLITIS, ANKYLOSING 06/15/2008   Atherosclerotic heart disease of native coronary artery without angina pectoris 06/15/2008   Gastro-esophageal reflux disease without esophagitis 06/15/2008    Past Medical History:  Diagnosis Date   Acute hepatitis 01/26/2019   Acute kidney failure ([place]) 03/30/2018   AKI (acute kidney injury) ([place])    Alcohol use disorder, mild, abuse 10/14/2019   Anginal pain ([place])    Anxiety    Arthritis    SPINE    Arthritis    Cellulitis 03/30/2018   Coronary artery disease    s/p multiple percutaneous interventions, PCI 202 for DMI and DES distal RCA and CFX-OM 2006   Coronary artery disease    Depression    Depression, major, single episode, mild ([place]) 06/15/2008   Qualifier: Diagnosis of  By: Orville Govern, CMA, [place]     GERD (gastroesophageal reflux disease)    Hyperlipidemia  mixed   Hyperlipidemia    Hypertension    Major depressive disorder, single episode 06/15/2008   Overview:  Overview:  Qualifier: Diagnosis of  By: Orville Govern, CMA, Carol    MI (myocardial infarction) ([place])    Myocardial infarction ([place])    ? 2006   Numbness and tingling in right hand 07/13/2019   Peripheral neuropathy    calves and both feet   Peripheral neuropathy    calves and both feet   Puncture wound  of right foot 03/07/2020   Rhabdomyolysis 02/02/2019   Severe sepsis with acute organ dysfunction ([place]) 12/03/2016   Spondylitis, ankylosing ([place])     Family History  Problem Relation Age of Onset   Breast cancer Mother 44   Heart disease Father    Cancer Brother    Heart disease Brother    Heart attack Brother    Leukemia Maternal Grandmother    Heart disease Maternal Grandfather    Heart disease Other        positive for cardiac disease and both brothers have had cardiac events   Allergic rhinitis Neg Hx    Angioedema Neg Hx    Asthma Neg Hx    Atopy Neg Hx    Immunodeficiency Neg Hx    Eczema Neg Hx    Urticaria Neg Hx    Colon cancer Neg Hx    Colon polyps Neg Hx    Esophageal cancer Neg Hx    Rectal cancer Neg Hx    Stomach cancer Neg Hx    Past Surgical History:  Procedure Laterality Date   ACHILLES TENDON REPAIR     CARDIAC CATHETERIZATION     stent RCA June3, 2002, Stent OM/PTCA circumflex August 13, 2000   CATARACT EXTRACTION Bilateral    CORONARY ANGIOPLASTY WITH STENT PLACEMENT     first one in 2006; had another place 3 months ago (August 2013)   CORONARY ANGIOPLASTY WITH STENT PLACEMENT  03/26/2013   RCA           DR COOPER   CORONARY ANGIOPLASTY WITH STENT PLACEMENT     CORONARY ARTERY BYPASS GRAFT N/A 10/29/2013   Procedure: CORONARY ARTERY BYPASS GRAFTING x 4 (LIMA-LAD, SVG-OM, SVG-PD-PL) ENDOSCOPIC VEIN HARVEST RIGHT THIGH;  Surgeon: Gaye Pollack, MD;  Location: [facility];  Service: Open Heart Surgery;  Laterality: N/A;   CORONARY ARTERY BYPASS GRAFT     FINGER SURGERY     5  TH     DIGIT RIGHT HAND   FINGER SURGERY     INTRAOPERATIVE TRANSESOPHAGEAL ECHOCARDIOGRAM N/A 10/29/2013   Procedure: INTRAOPERATIVE TRANSESOPHAGEAL ECHOCARDIOGRAM;  Surgeon: Gaye Pollack, MD;  Location: [place] OR;  Service: Open Heart Surgery;  Laterality: N/A;   KNEE ARTHROSCOPY W/ LASER  2003   LEFT HEART CATHETERIZATION WITH CORONARY ANGIOGRAM N/A 10/01/2011   Procedure: LEFT HEART  CATHETERIZATION WITH CORONARY ANGIOGRAM;  Surgeon: [name], MD;  Location: [facility] CATH LAB;  Service: Cardiovascular;  Laterality: N/A;   LEFT HEART CATHETERIZATION WITH CORONARY ANGIOGRAM N/A 03/26/2013   Procedure: LEFT HEART CATHETERIZATION WITH CORONARY ANGIOGRAM;  Surgeon: Blane Ohara, MD;  Location: [facility] CATH LAB;  Service: Cardiovascular;  Laterality: N/A;   LEFT HEART CATHETERIZATION WITH CORONARY ANGIOGRAM N/A 10/25/2013   Procedure: LEFT HEART CATHETERIZATION WITH CORONARY ANGIOGRAM;  Surgeon: Blane Ohara, MD;  Location: [facility] CATH LAB;  Service: Cardiovascular;  Laterality: N/A;   TONSILLECTOMY     Social History   Social History Narrative   ** Merged  History Encounter **       Lives in a one level home with son, and dog, Morton. Works out of the house. 2 steps into house. Smoke alarms in house. No grab bars in bathroom. Has some area rugs and throw rugs with backing.      No pets of his own. Restores antique pens, does like to walk and cycle but having problems with peripheral neuropathy.       Firearm is safely stored.      Wears seat belts in vehicle. Does use sunblock and tries to avoid sun due to vitiligo and previous sunburns.      Eats varied diet. Likes protein, fresh fruits and veggies. Watches fats, saturated fats. Drink a lot of water.    Immunization History  Administered Date(s) Administered   Influenza, High Dose Seasonal PF 11/23/2016   Influenza,inj,Quad PF,6+ Mos 12/18/2017, 11/20/2018   Influenza-Unspecified 12/02/2019, 12/01/2020   PFIZER(Purple Top)SARS-COV-2 Vaccination 05/03/2019, 05/24/2019, 12/07/2019, 06/06/2020   Pfizer Covid-19 Vaccine Bivalent Booster 72yr & up 12/01/2020   Pneumococcal Conjugate-13 01/20/2017   Pneumococcal Polysaccharide-23 07/01/2015, 04/01/2018   Tdap 06/30/2015   Unspecified SARS-COV-2 Vaccination 12/10/2021   Zoster Recombinat (Shingrix) 06/28/2019, 09/13/2019     Objective: Vital Signs: BP (!) 145/79 (BP  Location: Left Arm, Patient Position: Sitting, Cuff Size: Large)   Pulse 73   Resp 16   Ht 6' 2"$  (1.88 m)   Wt 249 lb (112.9 kg)   BMI 31.97 kg/m    Physical Exam Vitals and nursing note reviewed.  Constitutional:      Appearance: He is well-developed.  HENT:     Head: Normocephalic and atraumatic.  Eyes:     Conjunctiva/sclera: Conjunctivae normal.     Pupils: Pupils are equal, round, and reactive to light.  Cardiovascular:     Rate and Rhythm: Normal rate and regular rhythm.     Heart sounds: Normal heart sounds.  Pulmonary:     Effort: Pulmonary effort is normal.     Breath sounds: Normal breath sounds.  Abdominal:     General: Bowel sounds are normal.     Palpations: Abdomen is soft.  Musculoskeletal:     Cervical back: Normal range of motion and neck supple.  Skin:    General: Skin is warm and dry.     Capillary Refill: Capillary refill takes less than 2 seconds.  Neurological:     Mental Status: He is alert and oriented to person, place, and time.  Psychiatric:        Behavior: Behavior normal.      Musculoskeletal Exam: He had limited extension of his cervical spine.  He had limited lateral rotation of the cervical spine.  Thoracic kyphosis was noted.  He has some limitation range of motion of thoracic and lumbar spine.  He had no SI joint tenderness.  Shoulders, elbows, wrist joints, MCPs PIPs and DIPs were in good range of motion.  He had bilateral PIP and DIP thickening with no synovitis.  Bilateral hip joints and knee joints were in good range of motion without any warmth swelling or effusion.  There was no tenderness over ankles or MTPs.  There was no Achilles tendinitis of Planter fasciitis.  CDAI Exam: CDAI Score: -- Patient Global: --; Provider Global: -- Swollen: --; Tender: -- Joint Exam 05/02/2022   No joint exam has been documented for this visit   There is currently no information documented on the homunculus. Go to the Rheumatology activity and  complete  the homunculus joint exam.  Investigation: No additional findings.  Imaging: No results found.  Recent Labs: Lab Results  Component Value Date   WBC 7.2 10/16/2021   HGB 9.7 (L) 10/16/2021   PLT 140 (L) 10/16/2021   NA 135 11/05/2021   K 3.6 11/05/2021   CL 97 11/05/2021   CO2 23 11/05/2021   GLUCOSE 103 (H) 11/05/2021   BUN 14 11/05/2021   CREATININE 1.13 11/05/2021   BILITOT 1.0 10/16/2021   ALKPHOS 76 10/16/2021   AST 31 10/16/2021   ALT 24 10/16/2021   PROT 7.4 10/16/2021   ALBUMIN 3.8 10/16/2021   CALCIUM 9.3 11/05/2021   GFRAA 105 07/24/2020   QFTBGOLDPLUS NEGATIVE 11/10/2019    Speciality Comments: Enbrel started December 09, 2019, Humira 04/22-07/22  Procedures:  No procedures performed Allergies: Patient has no known allergies.   Assessment / Plan:     Visit Diagnoses: Ankylosing spondylitis, unspecified site of spine ([place]) - Ttd with Enbrel in 2021 at [facility] 2022 with no improvement in symptoms.  He returns today after 1 year later.  He states he had less pain while he was on Humira.  However, he does not want to be on immunosuppressive therapies.  He states the discomfort he is and is manageable.  History of iritis - No recurrence in 20 years.  Primary osteoarthritis of both knees - Bilateral severe osteoarthritis and severe chondromalacia patella.  He is not having much discomfort.  No warmth swelling or effusion was noted.  Chronic SI joint pain-he continues to have some discomfort in the SI joints.  DDD (degenerative disc disease), thoracic-he has limitation with range of motion without much discomfort today.  Arthropathy of lumbar facet joint-he has chronic discomfort.  History of vertebral fracture - L1 vertebral body fracture November 2020 due to injury.  Osteoporosis screening-he is 72 years old.  He had to fractures related to trauma.  He denies taking long-term antiepileptics, antacids or thyroid medications.  He denies long-term use  of steroids.  Based on his age he will schedule DEXA scan to rule out osteoporosis.  Calcium rich diet and exercise was emphasized.  Essential hypertension-his blood pressure was elevated initially but the repeat blood pressure was normal.  Other medical problems listed as follows:  Coronary artery disease involving native coronary artery of native heart without angina pectoris  S/P CABG x 4  Chronic anemia  Peripheral polyneuropathy  Gastro-esophageal reflux disease without esophagitis  IgA deficiency, selective ([place]) - Evaluated by ID and immunology in the past.  Vitiligo  History of depression  History of cryptosporidiosis  History of alcohol use disorder  Orders: No orders of the defined types were placed in this encounter.  No orders of the defined types were placed in this encounter.    Follow-Up Instructions: Return in about 1 year (around 05/03/2023) for Ankylosing spondylitis.   Bo Merino, MD  Note - This record has been created using Editor, commissioning.  Chart creation errors have been sought, but may not always  have been located. Such creation errors do not reflect on  the standard of medical care.

## 2022-05-02 ENCOUNTER — Encounter: Payer: Self-pay | Admitting: Rheumatology

## 2022-05-02 ENCOUNTER — Other Ambulatory Visit: Payer: Self-pay | Admitting: *Deleted

## 2022-05-02 ENCOUNTER — Ambulatory Visit: Payer: Medicare Other | Attending: Rheumatology | Admitting: Rheumatology

## 2022-05-02 VITALS — BP 127/72 | HR 70 | Resp 16 | Ht 74.0 in | Wt 249.0 lb

## 2022-05-02 DIAGNOSIS — G629 Polyneuropathy, unspecified: Secondary | ICD-10-CM | POA: Diagnosis not present

## 2022-05-02 DIAGNOSIS — M459 Ankylosing spondylitis of unspecified sites in spine: Secondary | ICD-10-CM

## 2022-05-02 DIAGNOSIS — M47816 Spondylosis without myelopathy or radiculopathy, lumbar region: Secondary | ICD-10-CM | POA: Diagnosis not present

## 2022-05-02 DIAGNOSIS — Z8669 Personal history of other diseases of the nervous system and sense organs: Secondary | ICD-10-CM

## 2022-05-02 DIAGNOSIS — M533 Sacrococcygeal disorders, not elsewhere classified: Secondary | ICD-10-CM | POA: Diagnosis not present

## 2022-05-02 DIAGNOSIS — Z13820 Encounter for screening for osteoporosis: Secondary | ICD-10-CM

## 2022-05-02 DIAGNOSIS — Z8781 Personal history of (healed) traumatic fracture: Secondary | ICD-10-CM

## 2022-05-02 DIAGNOSIS — I10 Essential (primary) hypertension: Secondary | ICD-10-CM

## 2022-05-02 DIAGNOSIS — D802 Selective deficiency of immunoglobulin A [IgA]: Secondary | ICD-10-CM

## 2022-05-02 DIAGNOSIS — I2510 Atherosclerotic heart disease of native coronary artery without angina pectoris: Secondary | ICD-10-CM

## 2022-05-02 DIAGNOSIS — M170 Bilateral primary osteoarthritis of knee: Secondary | ICD-10-CM | POA: Diagnosis not present

## 2022-05-02 DIAGNOSIS — K219 Gastro-esophageal reflux disease without esophagitis: Secondary | ICD-10-CM

## 2022-05-02 DIAGNOSIS — L80 Vitiligo: Secondary | ICD-10-CM

## 2022-05-02 DIAGNOSIS — Z8659 Personal history of other mental and behavioral disorders: Secondary | ICD-10-CM

## 2022-05-02 DIAGNOSIS — Z951 Presence of aortocoronary bypass graft: Secondary | ICD-10-CM

## 2022-05-02 DIAGNOSIS — D649 Anemia, unspecified: Secondary | ICD-10-CM | POA: Diagnosis not present

## 2022-05-02 DIAGNOSIS — Z87898 Personal history of other specified conditions: Secondary | ICD-10-CM

## 2022-05-02 DIAGNOSIS — Z8619 Personal history of other infectious and parasitic diseases: Secondary | ICD-10-CM

## 2022-05-02 DIAGNOSIS — G8929 Other chronic pain: Secondary | ICD-10-CM

## 2022-05-02 DIAGNOSIS — M5134 Other intervertebral disc degeneration, thoracic region: Secondary | ICD-10-CM

## 2022-05-13 ENCOUNTER — Ambulatory Visit (HOSPITAL_BASED_OUTPATIENT_CLINIC_OR_DEPARTMENT_OTHER)
Admission: RE | Admit: 2022-05-13 | Discharge: 2022-05-13 | Disposition: A | Discharge status: Home / Self Care | Payer: Medicare Other | Source: Ambulatory Visit | Attending: Rheumatology | Admitting: Rheumatology

## 2022-05-13 DIAGNOSIS — M85852 Other specified disorders of bone density and structure, left thigh: Secondary | ICD-10-CM | POA: Insufficient documentation

## 2022-05-13 DIAGNOSIS — Z13820 Encounter for screening for osteoporosis: Secondary | ICD-10-CM | POA: Insufficient documentation

## 2022-05-14 ENCOUNTER — Encounter: Payer: Self-pay | Admitting: *Deleted

## 2022-05-14 NOTE — Progress Notes (Signed)
DEXA scan indicates osteopenia with T-score of -1.6 in the left femoral neck.  Use of calcium rich diet and vitamin D intake is recommended.  Resistive exercises are recommended.  We should repeat DEXA scan in 2 years.

## 2022-05-14 NOTE — Progress Notes (Signed)
Total calcium intake should be 1200 mg between dietary and supplement.  Most of the calcium intake should come from diet.  I do not have a vitamin D level.  He may take calcium with vitamin D which is over-the-counter.  A future vitamin D level will be helpful in guiding the dose of vitamin D.

## 2022-05-22 ENCOUNTER — Ambulatory Visit (HOSPITAL_BASED_OUTPATIENT_CLINIC_OR_DEPARTMENT_OTHER): Payer: Medicare Other | Admitting: Orthopaedic Surgery

## 2022-05-22 ENCOUNTER — Ambulatory Visit (INDEPENDENT_AMBULATORY_CARE_PROVIDER_SITE_OTHER): Payer: Medicare Other

## 2022-05-22 DIAGNOSIS — M25572 Pain in left ankle and joints of left foot: Secondary | ICD-10-CM | POA: Diagnosis not present

## 2022-05-22 DIAGNOSIS — S82402A Unspecified fracture of shaft of left fibula, initial encounter for closed fracture: Secondary | ICD-10-CM | POA: Diagnosis not present

## 2022-05-22 NOTE — Progress Notes (Signed)
Chief Complaint: Left ankle fracture     History of Present Illness:   05/22/2022: Presents today for follow-up of his left ankle fibular fracture.  Overall he has no pain.  He is back to all of his activities without any pain.  Gregory Hansen is a 72 y.o. male presents today for follow-up after a fall on October 14, 2021.  He states that he initially had some pain and swelling about the ankle although he was able to bear weight on it and subsequently did not present for follow-up at that time.  He recently had a fall in which he broke several ribs.  Foot x-ray was obtained which did show a nondisplaced fibular fracture.  He is here today for further assessment.  He was placed in a cam boot.  He does not like this.  He denies any history of diabetes.    Surgical History:   None  PMH/PSH/Family History/Social History/Meds/Allergies:    Past Medical History:  Diagnosis Date   Acute hepatitis 01/26/2019   Acute kidney failure ([place]) 03/30/2018   AKI (acute kidney injury) ([place])    Alcohol use disorder, mild, abuse 10/14/2019   Anginal pain ([place])    Anxiety    Arthritis    SPINE    Arthritis    Cellulitis 03/30/2018   Coronary artery disease    s/p multiple percutaneous interventions, PCI 202 for DMI and DES distal RCA and CFX-OM 2006   Coronary artery disease    Depression    Depression, major, single episode, mild ([place]) 06/15/2008   Qualifier: Diagnosis of  By: Orville Govern, CMA, Carol     GERD (gastroesophageal reflux disease)    Hyperlipidemia    mixed   Hyperlipidemia    Hypertension    Major depressive disorder, single episode 06/15/2008   Overview:  Overview:  Qualifier: Diagnosis of  By: Orville Govern, CMA, Carol    MI (myocardial infarction) ([place])    Myocardial infarction ([place])    ? 2006   Numbness and tingling in right hand 07/13/2019   Peripheral neuropathy    calves and both feet   Peripheral neuropathy    calves and both feet   Puncture wound of right  foot 03/07/2020   Rhabdomyolysis 02/02/2019   Severe sepsis with acute organ dysfunction ([place]) 12/03/2016   Spondylitis, ankylosing ([place])    Past Surgical History:  Procedure Laterality Date   ACHILLES TENDON REPAIR     CARDIAC CATHETERIZATION     stent RCA June3, 2002, Stent OM/PTCA circumflex August 13, 2000   CATARACT EXTRACTION Bilateral    CORONARY ANGIOPLASTY WITH STENT PLACEMENT     first one in 2006; had another place 3 months ago (August 2013)   CORONARY ANGIOPLASTY WITH STENT PLACEMENT  03/26/2013   RCA           DR COOPER   CORONARY ANGIOPLASTY WITH STENT PLACEMENT     CORONARY ARTERY BYPASS GRAFT N/A 10/29/2013   Procedure: CORONARY ARTERY BYPASS GRAFTING x 4 (LIMA-LAD, SVG-OM, SVG-PD-PL) ENDOSCOPIC VEIN HARVEST RIGHT THIGH;  Surgeon: Gaye Pollack, MD;  Location: [facility];  Service: Open Heart Surgery;  Laterality: N/A;   CORONARY ARTERY BYPASS GRAFT     FINGER SURGERY     5  TH     DIGIT RIGHT HAND  FINGER SURGERY     INTRAOPERATIVE TRANSESOPHAGEAL ECHOCARDIOGRAM N/A 10/29/2013   Procedure: INTRAOPERATIVE TRANSESOPHAGEAL ECHOCARDIOGRAM;  Surgeon: Gaye Pollack, MD;  Location[facility] OR;  Service: Open Heart Surgery;  Laterality: N/A;   KNEE ARTHROSCOPY W/ LASER  2003   LEFT HEART CATHETERIZATION WITH CORONARY ANGIOGRAM N/A 10/01/2011   Procedure: LEFT HEART CATHETERIZATION WITH CORONARY ANGIOGRAM;  Surgeon: [name], MD;  Location: [facility] CATH LAB;  Service: Cardiovascular;  Laterality: N/A;   LEFT HEART CATHETERIZATION WITH CORONARY ANGIOGRAM N/A 03/26/2013   Procedure: LEFT HEART CATHETERIZATION WITH CORONARY ANGIOGRAM;  Surgeon: Blane Ohara, MD;  Location: [facility] CATH LAB;  Service: Cardiovascular;  Laterality: N/A;   LEFT HEART CATHETERIZATION WITH CORONARY ANGIOGRAM N/A 10/25/2013   Procedure: LEFT HEART CATHETERIZATION WITH CORONARY ANGIOGRAM;  Surgeon: Blane Ohara, MD;  Location: [facility] CATH LAB;  Service: Cardiovascular;  Laterality: N/A;   TONSILLECTOMY     Social  History   Socioeconomic History   Marital status: Divorced    Spouse name: n/a   Number of children: 2   Years of education: Masters degree   Highest education level: Master's degree (e.g., MA, MS, MEng, MEd, MSW, MBA)  Occupational History   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   Drug use: No   Sexual activity: Not Currently    Birth control/protection: Condom  Other Topics Concern   Not on file  Social History Narrative   ** Merged History Encounter **       Lives in a one level home with son, and dog, Scientist, physiological. Works out of the house. 2 steps into house. Smoke alarms in house. No grab bars in bathroom. Has some area rugs and throw rugs with backing.      No pets of his own. Restores antique pens, does like to walk and cycle but having problems with peripheral neuropathy.       Firearm is safely stored.      Wears seat belts in vehicle. Does use sunblock and tries to avoid sun due to vitiligo and previous sunburns.      Eats varied diet. Likes protein, fresh fruits and veggies. Watches fats, saturated fats. Drink a lot of water.    Social Determinants of Health   Financial Resource Strain: Low Risk  (12/18/2017)   Overall Financial Resource Strain (CARDIA)    Difficulty of Paying Living Expenses: Not hard at all  Food Insecurity: No Food Insecurity (12/17/2021)   Hunger Vital Sign    Worried About Running Out of Food in the Last  Year: Never true    Ran Out of Food in the Last Year: Never true  Transportation Needs: No Transportation Needs (12/17/2021)   PRAPARE - Transportation    Lack of Transportation (Medical): No    Lack of  Transportation (Non-Medical): No  Physical Activity: Inactive (12/18/2017)   Exercise Vital Sign    Days of Exercise per Week: 0 days    Minutes of Exercise per Session: 0 min  Stress: No Stress Concern Present (12/18/2017)   [place]    Feeling of Stress : Not at all  Social Connections: Moderately Isolated (12/18/2017)   Social Connection and Isolation Panel [NHANES]    Frequency of Communication with Friends and Family: Three times a week    Frequency of Social Gatherings with Friends and Family: Once a week    Attends Religious Services: Never    Marine scientist or Organizations: No    Attends Music therapist: Never    Marital Status: Divorced   Family History  Problem Relation Age of Onset   Breast cancer Mother 59   Heart disease Father    Cancer Brother    Heart disease Brother    Heart attack Brother    Leukemia Maternal Grandmother    Heart disease Maternal Grandfather    Heart disease Other        positive for cardiac disease and both brothers have had cardiac events   Allergic rhinitis Neg Hx    Angioedema Neg Hx    Asthma Neg Hx    Atopy Neg Hx    Immunodeficiency Neg Hx    Eczema Neg Hx    Urticaria Neg Hx    Colon cancer Neg Hx    Colon polyps Neg Hx    Esophageal cancer Neg Hx    Rectal cancer Neg Hx    Stomach cancer Neg Hx    No Known Allergies Current Outpatient Medications  Medication Sig Dispense Refill   aspirin 81 MG tablet Take 81 mg by mouth every evening.      buPROPion (WELLBUTRIN XL) 300 MG 24 hr tablet Take 300 mg by mouth daily.     carvedilol (COREG) 12.5 MG tablet TAKE 1 TABLET BY MOUTH  TWICE DAILY WITH MEALS 200 tablet 2   hydrochlorothiazide  (MICROZIDE) 12.5 MG capsule Take 12.5 mg by mouth daily.     IBUPROFEN PO Take by mouth.     nitroGLYCERIN (NITROSTAT) 0.4 MG SL tablet DISSOLVE 1 TAB UNDER TONGUE FOR CHEST PAIN - IF PAIN REMAINS AFTER 5 MIN, CALL 911 AND REPEAT DOSE. MAX 3 TABS IN 15 MINUTES. Please call [phone] to schedule an appointment for future refills. Thank you. 1st attempt. 25 tablet 0   pantoprazole (PROTONIX) 40 MG tablet Take 1 tablet (40 mg total) by mouth daily. 90 tablet 2   rosuvastatin (CRESTOR) 20 MG tablet TAKE 1 TABLET BY MOUTH DAILY 100 tablet 2   sildenafil (REVATIO) 20 MG tablet TAKE 2-5 TABLETS BY MOUTH AS NEEDED PRIOR TO SEXUAL ACTIVITY 50 tablet 3   Vilazodone HCl (VIIBRYD PO) Take 20 mg by mouth daily.      vitamin B-12 (CYANOCOBALAMIN) 1000 MCG tablet Take 1,000 mcg by mouth daily.     No current facility-administered medications for this visit.   No results found.  Review of Systems:   A ROS was performed including pertinent positives and negatives as documented in the HPI.  Physical Exam :   Constitutional: NAD and appears stated age Neurological: Alert and oriented Psych: Appropriate affect and cooperative There were no vitals taken for this visit.   Comprehensive Musculoskeletal Exam:    Np tenderness palpation about the distal fibula.  Sensation is intact  in all distributions distally.  2+ dorsalis pedis pulse.  Ankle range of motion is normal as compared to the contralateral side with no swelling  Imaging:   Xray (3 views left ankle): Essentially nondisplaced Weber B fibula fracture with congruent mortise joint, there is some callus formation on x-rays today    I personally reviewed and interpreted the radiographs.   Assessment:   73 y.o. male with a nondisplaced left fibula fracture.  Overall he does have some significant callus on today's x-rays.  Given this I do believe that since he has no pain he may follow-up as needed Plan :    -Return to clinic as  needed     I personally saw and evaluated the patient, and participated in the management and treatment plan.  Vanetta Mulders, MD Attending Physician, Orthopedic Surgery  This document was dictated using Dragon voice recognition software. A reasonable attempt at proof reading has been made to minimize errors.  To patient's 6-week.

## 2022-07-06 ENCOUNTER — Other Ambulatory Visit: Payer: Self-pay | Admitting: Family Medicine

## 2022-07-06 DIAGNOSIS — I10 Essential (primary) hypertension: Secondary | ICD-10-CM

## 2022-07-18 ENCOUNTER — Telehealth: Payer: Self-pay | Admitting: Family Medicine

## 2022-07-18 NOTE — Telephone Encounter (Signed)
Contacted Camden Koeth Boswell to schedule their annual wellness visit. Appointment made for 07/22/2022.  Thank you,  [facility] Support [facility] Direct dial  [phone]

## 2022-07-22 ENCOUNTER — Ambulatory Visit (INDEPENDENT_AMBULATORY_CARE_PROVIDER_SITE_OTHER): Payer: Medicare Other

## 2022-07-22 VITALS — Ht 74.0 in | Wt 249.0 lb

## 2022-07-22 DIAGNOSIS — Z0000 Encounter for general adult medical examination without abnormal findings: Secondary | ICD-10-CM | POA: Diagnosis not present

## 2022-07-22 NOTE — Progress Notes (Signed)
Subjective:   Gregory Hansen is a 72 y.o. male who presents for Medicare Annual/Subsequent preventive examination.  I connected with  Gregory Hansen on 07/22/22 by a audio enabled telemedicine application and verified that I am speaking with the correct person using two identifiers.  Patient Location: Home  Provider Location: Home Office  I discussed the limitations of evaluation and management by telemedicine. The patient expressed understanding and agreed to proceed.  Review of Systems     Cardiac Risk Factors include: advanced age (>75men, >41 women);hypertension;male gender     Objective:    Today's Vitals   07/22/22 1411  Weight: 249 lb (112.9 kg)  Height: 6\' 2"  (1.88 m)   Body mass index is 31.97 kg/m.     07/22/2022    2:12 PM 02/21/2022    3:33 PM 12/14/2021    8:05 PM 10/16/2021    2:20 PM 08/21/2021   11:27 AM 02/06/2021    2:57 PM 05/24/2020   11:05 AM  Advanced Directives  Does Patient Have a Medical Advance Directive? Yes No Yes Yes Yes No Yes  Type of Advance Directive Living will;Healthcare Power of Attorney  Living will;Healthcare Power of Attorney Living will Living will;Healthcare Power of Attorney  Living will;Healthcare Power of Attorney  Does patient want to make changes to medical advance directive? No - Patient declined   No - Patient declined No - Patient declined  No - Patient declined  Copy of Healthcare Power of Attorney in Chart? Yes - validated most recent copy scanned in chart (See row information)  Yes - validated most recent copy scanned in chart (See row information), Physician notified No - copy requested Yes - validated most recent copy scanned in chart (See row information)  Yes - validated most recent copy scanned in chart (See row information)  Would patient like information on creating a medical advance directive?  No - Patient declined    No - Patient declined     Current Medications (verified) Outpatient Encounter Medications as of  07/22/2022  Medication Sig   aspirin 81 MG tablet Take 81 mg by mouth every evening.    buPROPion (WELLBUTRIN XL) 300 MG 24 hr tablet Take 300 mg by mouth daily.   carvedilol (COREG) 12.5 MG tablet TAKE 1 TABLET BY MOUTH TWICE  DAILY WITH MEALS   hydrochlorothiazide (MICROZIDE) 12.5 MG capsule Take 12.5 mg by mouth daily.   IBUPROFEN PO Take by mouth.   nitroGLYCERIN (NITROSTAT) 0.4 MG SL tablet DISSOLVE 1 TAB UNDER TONGUE FOR CHEST PAIN - IF PAIN REMAINS AFTER 5 MIN, CALL 911 AND REPEAT DOSE. MAX 3 TABS IN 15 MINUTES. Please call [phone] to schedule an appointment for future refills. Thank you. 1st attempt.   pantoprazole (PROTONIX) 40 MG tablet Take 1 tablet (40 mg total) by mouth daily.   rosuvastatin (CRESTOR) 20 MG tablet TAKE 1 TABLET BY MOUTH DAILY   sildenafil (REVATIO) 20 MG tablet TAKE 2-5 TABLETS BY MOUTH AS NEEDED PRIOR TO SEXUAL ACTIVITY   Vilazodone HCl (VIIBRYD PO) Take 20 mg by mouth daily.    vitamin B-12 (CYANOCOBALAMIN) 1000 MCG tablet Take 1,000 mcg by mouth daily.   No facility-administered encounter medications on file as of 07/22/2022.    Allergies (verified) Patient has no known allergies.   History: Past Medical History:  Diagnosis Date   Acute hepatitis 01/26/2019   Acute kidney failure (HCC) 03/30/2018   AKI (acute kidney injury) (HCC)    Alcohol use disorder, mild, abuse  10/14/2019   Anginal pain (HCC)    Anxiety    Arthritis    SPINE    Arthritis    Cellulitis 03/30/2018   Coronary artery disease    s/p multiple percutaneous interventions, PCI 202 for DMI and DES distal RCA and CFX-OM 2006   Coronary artery disease    Depression    Depression, major, single episode, mild (HCC) 06/15/2008   Qualifier: Diagnosis of  By: Denyse Amass, CMA, Carol     GERD (gastroesophageal reflux disease)    Hyperlipidemia    mixed   Hyperlipidemia    Hypertension    Major depressive disorder, single episode 06/15/2008   Overview:  Overview:  Qualifier: Diagnosis of  By:  Denyse Amass, CMA, Carol    MI (myocardial infarction) (HCC)    Myocardial infarction (HCC)    ? 2006   Numbness and tingling in right hand 07/13/2019   Peripheral neuropathy    calves and both feet   Peripheral neuropathy    calves and both feet   Puncture wound of right foot 03/07/2020   Rhabdomyolysis 02/02/2019   Severe sepsis with acute organ dysfunction (HCC) 12/03/2016   Spondylitis, ankylosing (HCC)    Past Surgical History:  Procedure Laterality Date   ACHILLES TENDON REPAIR     CARDIAC CATHETERIZATION     stent RCA June3, 2002, Stent OM/PTCA circumflex August 13, 2000   CATARACT EXTRACTION Bilateral    CORONARY ANGIOPLASTY WITH STENT PLACEMENT     first one in 2006; had another place 3 months ago (August 2013)   CORONARY ANGIOPLASTY WITH STENT PLACEMENT  03/26/2013   RCA           DR COOPER   CORONARY ANGIOPLASTY WITH STENT PLACEMENT     CORONARY ARTERY BYPASS GRAFT N/A 10/29/2013   Procedure: CORONARY ARTERY BYPASS GRAFTING x 4 (LIMA-LAD, SVG-OM, SVG-PD-PL) ENDOSCOPIC VEIN HARVEST RIGHT THIGH;  Surgeon: Alleen Borne, MD;  Location: [facility] OR;  Service: Open Heart Surgery;  Laterality: N/A;   CORONARY ARTERY BYPASS GRAFT     FINGER SURGERY     5  TH     DIGIT RIGHT HAND   FINGER SURGERY     INTRAOPERATIVE TRANSESOPHAGEAL ECHOCARDIOGRAM N/A 10/29/2013   Procedure: INTRAOPERATIVE TRANSESOPHAGEAL ECHOCARDIOGRAM;  Surgeon: Alleen Borne, MD;  Location: [facility] OR;  Service: Open Heart Surgery;  Laterality: N/A;   KNEE ARTHROSCOPY W/ LASER  2003   LEFT HEART CATHETERIZATION WITH CORONARY ANGIOGRAM N/A 10/01/2011   Procedure: LEFT HEART CATHETERIZATION WITH CORONARY ANGIOGRAM;  Surgeon: [name], MD;  Location: [facility] CATH LAB;  Service: Cardiovascular;  Laterality: N/A;   LEFT HEART CATHETERIZATION WITH CORONARY ANGIOGRAM N/A 03/26/2013   Procedure: LEFT HEART CATHETERIZATION WITH CORONARY ANGIOGRAM;  Surgeon: Micheline Chapman, MD;  Location: [facility] CATH LAB;  Service: Cardiovascular;   Laterality: N/A;   LEFT HEART CATHETERIZATION WITH CORONARY ANGIOGRAM N/A 10/25/2013   Procedure: LEFT HEART CATHETERIZATION WITH CORONARY ANGIOGRAM;  Surgeon: Micheline Chapman, MD;  Location: [facility] CATH LAB;  Service: Cardiovascular;  Laterality: N/A;   TONSILLECTOMY     Family History  Problem Relation Age of Onset   Breast cancer Mother 69   Heart disease Father    Cancer Brother    Heart disease Brother    Heart attack Brother    Leukemia Maternal Grandmother    Heart disease Maternal Grandfather    Heart disease Other        positive for cardiac disease and both brothers have had  cardiac events   Allergic rhinitis Neg Hx    Angioedema Neg Hx    Asthma Neg Hx    Atopy Neg Hx    Immunodeficiency Neg Hx    Eczema Neg Hx    Urticaria Neg Hx    Colon cancer Neg Hx    Colon polyps Neg Hx    Esophageal cancer Neg Hx    Rectal cancer Neg Hx    Stomach cancer Neg Hx    Social History   Socioeconomic History   Marital status: Divorced    Spouse name: n/a   Number of children: 2   Years of education: Masters degree   Highest education level: Master's degree (e.g., MA, MS, MEng, MEd, MSW, MBA)  Occupational History   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   Drug use: No   Sexual activity: Not Currently    Birth control/protection: Condom  Other Topics Concern   Not on file  Social History Narrative    ** Merged History Encounter **       Lives in a one level home with son, and dog, Chief Operating Officer. Works out of the house. 2 steps into house. Smoke alarms in house. No grab bars in bathroom. Has some area rugs and throw rugs with backing.      No pets of his own. Restores antique pens, does like to walk and cycle but having problems with peripheral neuropathy.       Firearm is safely stored.      Wears seat belts in vehicle. Does use sunblock and tries to avoid sun due to vitiligo and previous sunburns.      Eats varied diet. Likes protein, fresh fruits and veggies. Watches fats, saturated fats. Drink a lot of water.    Social Determinants of Health   Financial Resource Strain: Low Risk  (07/22/2022)   Overall Financial Resource Strain (CARDIA)    Difficulty of Paying Living Expenses: Not hard at all  Food Insecurity: No Food Insecurity (07/22/2022)   Hunger Vital Sign    Worried About Running Out of Food in the Last Year: Never true    Ran Out of Food in the Last Year: Never true  Transportation Needs: No Transportation Needs (07/22/2022)   PRAPARE - Administrator, Civil Service (Medical): No    Lack of Transportation (Non-Medical): No  Physical Activity: Insufficiently Active (07/22/2022)   Exercise Vital Sign    Days of Exercise per Week: 3 days    Minutes of Exercise per Session: 30 min  Stress: No Stress Concern Present (07/22/2022)   [employer] of Occupational Health - Occupational Stress Questionnaire    Feeling of Stress : Not at all  Social Connections: Socially Isolated (07/22/2022)  Social Advertising account executive [NHANES]    Frequency of Communication with Friends and Family: More than three times a week    Frequency of Social Gatherings with Friends and Family: Once a week    Attends Religious Services: Never    Database administrator or Organizations: No    Attends Engineer, structural: Never    Marital Status: Divorced    Tobacco  Counseling Counseling given: Not Answered Tobacco comments: no plans to start back   Clinical Intake:  Pre-visit preparation completed: Yes  Pain : No/denies pain  Diabetes: No  How often do you need to have someone help you when you read instructions, pamphlets, or other written materials from your doctor or pharmacy?: 1 - Never  Diabetic?No   Interpreter Needed?: No  Information entered by :: Kandis Fantasia LPN   Activities of Daily Living    07/22/2022    2:13 PM  In your present state of health, do you have any difficulty performing the following activities:  Hearing? 0  Vision? 0  Difficulty concentrating or making decisions? 0  Walking or climbing stairs? 0  Dressing or bathing? 0  Doing errands, shopping? 0  Preparing Food and eating ? N  Using the Toilet? N  In the past six months, have you accidently leaked urine? N  Do you have problems with loss of bowel control? N  Managing your Medications? N  Managing your Finances? N  Housekeeping or managing your Housekeeping? N    Patient Care Team: Evelena Leyden, DO as PCP - General (Family Medicine) [name], MD as PCP - Cardiology (Cardiology) Hinda Kehr, MD as Referring Physician (Ophthalmology) Berton Bon, NP (Inactive) as Nurse Practitioner (Nurse Practitioner) Venancio Poisson, MD as Consulting Physician (Dermatology) Lunette Stands, MD as Referring Physician (Orthopedic Surgery) Marcelline Deist, MD as Consulting Physician (Ophthalmology) Pollyann Savoy, MD as Consulting Physician (Rheumatology)  Indicate any recent Medical Services you may have received from other than [facility] providers in the past year (date may be approximate).     Assessment:   This is a routine wellness examination for Zalmen.  Hearing/Vision screen Hearing Screening - Comments:: Denies hearing difficulties   Vision Screening - Comments:: Wears rx glasses - up to date with routine eye exams with [facility]     Dietary issues and exercise activities discussed: Current Exercise Habits: Home exercise routine, Type of exercise: walking, Time (Minutes): 30, Frequency (Times/Week): 5, Weekly Exercise (Minutes/Week): 150, Intensity: Mild   Goals Addressed             This Visit's Progress    Remain active and independent        Depression Screen    07/22/2022    2:21 PM 02/21/2022    3:32 PM 11/05/2021    2:52 PM 10/16/2021    2:20 PM 08/21/2021   11:26 AM 02/06/2021    2:57 PM 03/07/2020    4:13 PM  PHQ 2/9 Scores  PHQ - 2 Score 0 0 0 1 0 0 0  PHQ- 9 Score  0 1 3 2 1  0    Fall Risk    07/22/2022    2:11 PM 02/21/2022    3:32 PM 11/05/2021    2:52 PM 10/16/2021    2:21 PM 08/21/2021   11:26 AM  Fall Risk   Falls in the past year? 0 1 0 1 1  Number falls in past yr: 0 0 0 0 1  Injury with Fall? 0  0 0 1 0  Risk for fall due to : No Fall Risks    History of fall(s);Impaired balance/gait  Follow up Falls prevention discussed;Education provided;Falls evaluation completed    Falls evaluation completed;Falls prevention discussed    FALL RISK PREVENTION PERTAINING TO THE HOME:  Any stairs in or around the home? Yes  If so, are there any without handrails? No  Home free of loose throw rugs in walkways, pet beds, electrical cords, etc? Yes  Adequate lighting in your home to reduce risk of falls? Yes   ASSISTIVE DEVICES UTILIZED TO PREVENT FALLS:  Life alert? No  Use of a cane, walker or w/c? No  Grab bars in the bathroom? Yes  Shower chair or bench in shower? Yes  Elevated toilet seat or a handicapped toilet? Yes   TIMED UP AND GO:  Was the test performed? No . Telephonic visit   Cognitive Function:    12/18/2017    3:28 PM  MMSE - Mini Mental State Exam  Orientation to time 5  Orientation to Place 5  Registration 3  Attention/ Calculation 5  Recall 3  Language- name 2 objects 2  Language- repeat 1  Language- follow 3 step command 3  Language- read & follow direction 1   Write a sentence 1  Copy design 1  Total score 30        07/22/2022    2:13 PM 12/18/2017    3:27 PM  6CIT Screen  What Year? 0 points 0 points  What month? 0 points 0 points  What time? 0 points 0 points  Count back from 20 0 points 0 points  Months in reverse 0 points 0 points  Repeat phrase 0 points 0 points  Total Score 0 points 0 points    Immunizations Immunization History  Administered Date(s) Administered   Influenza, High Dose Seasonal PF 11/23/2016   Influenza,inj,Quad PF,6+ Mos 12/18/2017, 11/20/2018   Influenza-Unspecified 12/02/2019, 12/01/2020   PFIZER(Purple Top)SARS-COV-2 Vaccination 05/03/2019, 05/24/2019, 12/07/2019, 06/06/2020   Pfizer Covid-19 Vaccine Bivalent Booster 41yrs & up 12/01/2020   Pneumococcal Conjugate-13 01/20/2017   Pneumococcal Polysaccharide-23 07/01/2015, 04/01/2018   Tdap 06/30/2015   Unspecified SARS-COV-2 Vaccination 12/10/2021   Zoster Recombinat (Shingrix) 06/28/2019, 09/13/2019    TDAP status: Up to date  Flu Vaccine status: Up to date  Pneumococcal vaccine status: Up to date  Covid-19 vaccine status: Information provided on how to obtain vaccines.   Qualifies for Shingles Vaccine? Yes   Zostavax completed No   Shingrix Completed?: Yes  Screening Tests Health Maintenance  Topic Date Due   COVID-19 Vaccine (7 - 2023-24 season) 02/04/2022   INFLUENZA VACCINE  10/10/2022   Medicare Annual Wellness (AWV)  07/22/2023   DTaP/Tdap/Td (2 - Td or Tdap) 06/29/2025   COLONOSCOPY (Pts 45-44yrs Insurance coverage will need to be confirmed)  02/06/2030   Pneumonia Vaccine 51+ Years old  Completed   Hepatitis C Screening  Completed   Zoster Vaccines- Shingrix  Completed   HPV VACCINES  Aged Out   Fecal DNA (Cologuard)  Discontinued    Health Maintenance  Health Maintenance Due  Topic Date Due   COVID-19 Vaccine (7 - 2023-24 season) 02/04/2022    Colorectal cancer screening: Type of screening: Colonoscopy. Completed  02/07/20. Repeat every 10 years  Lung Cancer Screening: (Low Dose CT Chest recommended if Age 32-80 years, 30 pack-year currently smoking OR have quit w/in 15years.) does not qualify.   Lung Cancer Screening Referral: n/a  Additional Screening:  Hepatitis C Screening: does qualify; Completed 11/10/19  Vision Screening: Recommended annual ophthalmology exams for early detection of glaucoma and other disorders of the eye. Is the patient up to date with their annual eye exam?  Yes  Who is the provider or what is the name of the office in which the patient attends annual eye exams? Dr. Genia Del If pt is not established with a provider, would they like to be referred to a provider to establish care? No .   Dental Screening: Recommended annual dental exams for proper oral hygiene  Community Resource Referral / Chronic Care Management: CRR required this visit?  No   CCM required this visit?  No      Plan:     I have personally reviewed and noted the following in the patient's chart:   Medical and social history Use of alcohol, tobacco or illicit drugs  Current medications and supplements including opioid prescriptions. Patient is not currently taking opioid prescriptions. Functional ability and status Nutritional status Physical activity Advanced directives List of other physicians Hospitalizations, surgeries, and ER visits in previous 12 months Vitals Screenings to include cognitive, depression, and falls Referrals and appointments  In addition, I have reviewed and discussed with patient certain preventive protocols, quality metrics, and best practice recommendations. A written personalized care plan for preventive services as well as general preventive health recommendations were provided to patient.     Durwin Nora, [place]   [id number]   Due to this being a virtual visit, the after visit summary with patients personalized plan was offered to patient via mail or  my-chart. Patient would like to access on my-chart  Nurse Notes: No concerns

## 2022-07-22 NOTE — Patient Instructions (Signed)
Gregory Hansen , Thank you for taking time to come for your Medicare Wellness Visit. I appreciate your ongoing commitment to your health goals. Please review the following plan we discussed and let me know if I can assist you in the future.   These are the goals we discussed:  Goals      Remain active and independent        This is a list of the screening recommended for you and due dates:  Health Maintenance  Topic Date Due   COVID-19 Vaccine (7 - 2023-24 season) 02/04/2022   Flu Shot  10/10/2022   Medicare Annual Wellness Visit  07/22/2023   DTaP/Tdap/Td vaccine (2 - Td or Tdap) 06/29/2025   Colon Cancer Screening  02/06/2030   Pneumonia Vaccine  Completed   Hepatitis C Screening: USPSTF Recommendation to screen - Ages 29-79 yo.  Completed   Zoster (Shingles) Vaccine  Completed   HPV Vaccine  Aged Out   Cologuard (Stool DNA test)  Discontinued    Advanced directives: We have a copy of your advanced directives available in your record should your provider ever need to access them.   Conditions/risks identified: Aim for 30 minutes of exercise or brisk walking, 6-8 glasses of water, and 5 servings of fruits and vegetables each day.  Next appointment: Follow up in one year for your annual wellness visit.   Preventive Care 18 Years and Older, Male  Preventive care refers to lifestyle choices and visits with your health care provider that can promote health and wellness. What does preventive care include? A yearly physical exam. This is also called an annual well check. Dental exams once or twice a year. Routine eye exams. Ask your health care provider how often you should have your eyes checked. Personal lifestyle choices, including: Daily care of your teeth and gums. Regular physical activity. Eating a healthy diet. Avoiding tobacco and drug use. Limiting alcohol use. Practicing safe sex. Taking low doses of aspirin every day. Taking vitamin and mineral supplements as  recommended by your health care provider. What happens during an annual well check? The services and screenings done by your health care provider during your annual well check will depend on your age, overall health, lifestyle risk factors, and family history of disease. Counseling  Your health care provider may ask you questions about your: Alcohol use. Tobacco use. Drug use. Emotional well-being. Home and relationship well-being. Sexual activity. Eating habits. History of falls. Memory and ability to understand (cognition). Work and work Astronomer. Screening  You may have the following tests or measurements: Height, weight, and BMI. Blood pressure. Lipid and cholesterol levels. These may be checked every 5 years, or more frequently if you are over 36 years old. Skin check. Lung cancer screening. You may have this screening every year starting at age 14 if you have a 30-pack-year history of smoking and currently smoke or have quit within the past 15 years. Fecal occult blood test (FOBT) of the stool. You may have this test every year starting at age 26. Flexible sigmoidoscopy or colonoscopy. You may have a sigmoidoscopy every 5 years or a colonoscopy every 10 years starting at age 26. Prostate cancer screening. Recommendations will vary depending on your family history and other risks. Hepatitis C blood test. Hepatitis B blood test. Sexually transmitted disease (STD) testing. Diabetes screening. This is done by checking your blood sugar (glucose) after you have not eaten for a while (fasting). You may have this done every 1-3 years.  Abdominal aortic aneurysm (AAA) screening. You may need this if you are a current or former smoker. Osteoporosis. You may be screened starting at age 30 if you are at high risk. Talk with your health care provider about your test results, treatment options, and if necessary, the need for more tests. Vaccines  Your health care provider may recommend  certain vaccines, such as: Influenza vaccine. This is recommended every year. Tetanus, diphtheria, and acellular pertussis (Tdap, Td) vaccine. You may need a Td booster every 10 years. Zoster vaccine. You may need this after age 52. Pneumococcal 13-valent conjugate (PCV13) vaccine. One dose is recommended after age 78. Pneumococcal polysaccharide (PPSV23) vaccine. One dose is recommended after age 82. Talk to your health care provider about which screenings and vaccines you need and how often you need them. This information is not intended to replace advice given to you by your health care provider. Make sure you discuss any questions you have with your health care provider. Document Released: 03/24/2015 Document Revised: 11/15/2015 Document Reviewed: 12/27/2014 Elsevier Interactive Patient Education  2017 [employer] Prevention in the Home Falls can cause injuries. They can happen to people of all ages. There are many things you can do to make your home safe and to help prevent falls. What can I do on the outside of my home? Regularly fix the edges of walkways and driveways and fix any cracks. Remove anything that might make you trip as you walk through a door, such as a raised step or threshold. Trim any bushes or trees on the path to your home. Use bright outdoor lighting. Clear any walking paths of anything that might make someone trip, such as rocks or tools. Regularly check to see if handrails are loose or broken. Make sure that both sides of any steps have handrails. Any raised decks and porches should have guardrails on the edges. Have any leaves, snow, or ice cleared regularly. Use sand or salt on walking paths during winter. Clean up any spills in your garage right away. This includes oil or grease spills. What can I do in the bathroom? Use night lights. Install grab bars by the toilet and in the tub and shower. Do not use towel bars as grab bars. Use non-skid mats or  decals in the tub or shower. If you need to sit down in the shower, use a plastic, non-slip stool. Keep the floor dry. Clean up any water that spills on the floor as soon as it happens. Remove soap buildup in the tub or shower regularly. Attach bath mats securely with double-sided non-slip rug tape. Do not have throw rugs and other things on the floor that can make you trip. What can I do in the bedroom? Use night lights. Make sure that you have a light by your bed that is easy to reach. Do not use any sheets or blankets that are too big for your bed. They should not hang down onto the floor. Have a firm chair that has side arms. You can use this for support while you get dressed. Do not have throw rugs and other things on the floor that can make you trip. What can I do in the kitchen? Clean up any spills right away. Avoid walking on wet floors. Keep items that you use a lot in easy-to-reach places. If you need to reach something above you, use a strong step stool that has a grab bar. Keep electrical cords out of the way. Do not  use floor polish or wax that makes floors slippery. If you must use wax, use non-skid floor wax. Do not have throw rugs and other things on the floor that can make you trip. What can I do with my stairs? Do not leave any items on the stairs. Make sure that there are handrails on both sides of the stairs and use them. Fix handrails that are broken or loose. Make sure that handrails are as long as the stairways. Check any carpeting to make sure that it is firmly attached to the stairs. Fix any carpet that is loose or worn. Avoid having throw rugs at the top or bottom of the stairs. If you do have throw rugs, attach them to the floor with carpet tape. Make sure that you have a light switch at the top of the stairs and the bottom of the stairs. If you do not have them, ask someone to add them for you. What else can I do to help prevent falls? Wear shoes that: Do not  have high heels. Have rubber bottoms. Are comfortable and fit you well. Are closed at the toe. Do not wear sandals. If you use a stepladder: Make sure that it is fully opened. Do not climb a closed stepladder. Make sure that both sides of the stepladder are locked into place. Ask someone to hold it for you, if possible. Clearly mark and make sure that you can see: Any grab bars or handrails. First and last steps. Where the edge of each step is. Use tools that help you move around (mobility aids) if they are needed. These include: Canes. Walkers. Scooters. Crutches. Turn on the lights when you go into a dark area. Replace any light bulbs as soon as they burn out. Set up your furniture so you have a clear path. Avoid moving your furniture around. If any of your floors are uneven, fix them. If there are any pets around you, be aware of where they are. Review your medicines with your doctor. Some medicines can make you feel dizzy. This can increase your chance of falling. Ask your doctor what other things that you can do to help prevent falls. This information is not intended to replace advice given to you by your health care provider. Make sure you discuss any questions you have with your health care provider. Document Released: 12/22/2008 Document Revised: 08/03/2015 Document Reviewed: 04/01/2014 Elsevier Interactive Patient Education  2017 [employer].

## 2022-09-19 ENCOUNTER — Other Ambulatory Visit: Payer: Self-pay | Admitting: Cardiovascular Disease

## 2022-09-19 ENCOUNTER — Other Ambulatory Visit: Payer: Self-pay | Admitting: Physician Assistant

## 2022-09-19 NOTE — Telephone Encounter (Signed)
Gave pt #[id number] tabs only until upcoming appt.

## 2022-09-19 NOTE — Telephone Encounter (Signed)
Pt is requesting a refill on sildenafil. Pt has an upcoming appt on September 30, 2022. Would Dr. Excell Seltzer like to refill this medication? Please address

## 2022-09-30 ENCOUNTER — Encounter: Payer: Self-pay | Admitting: Cardiovascular Disease

## 2022-09-30 ENCOUNTER — Ambulatory Visit: Payer: Medicare Other | Attending: Cardiovascular Disease | Admitting: Cardiovascular Disease

## 2022-09-30 VITALS — BP 140/82 | HR 69 | Ht 74.0 in | Wt 253.4 lb

## 2022-09-30 DIAGNOSIS — I10 Essential (primary) hypertension: Secondary | ICD-10-CM

## 2022-09-30 DIAGNOSIS — E782 Mixed hyperlipidemia: Secondary | ICD-10-CM | POA: Diagnosis not present

## 2022-09-30 DIAGNOSIS — I700 Atherosclerosis of aorta: Secondary | ICD-10-CM | POA: Diagnosis not present

## 2022-09-30 DIAGNOSIS — I2510 Atherosclerotic heart disease of native coronary artery without angina pectoris: Secondary | ICD-10-CM | POA: Diagnosis not present

## 2022-09-30 MED ORDER — SILDENAFIL CITRATE 20 MG PO TABS: ORAL_TABLET | ORAL | 10 refills | Status: DC

## 2022-09-30 NOTE — Progress Notes (Signed)
Cardiology Office Note:    Date:  09/30/2022   ID:  Gregory Hansen, DOB 1950/07/23, MRN [id number]  PCP:  Tiffany Kocher, DO   [facility] Providers Cardiologist:  [name], MD     Referring MD: Tiffany Kocher, DO   Chief Complaint  Patient presents with   Coronary Artery Disease    History of Present Illness:    Gregory Hansen is a 72 y.o. male with a hx of:  Coronary artery disease  S/p multiple PCI procedures S/p CABG in 2015 Hypertension  Hyperlipidemia  GERD  Anxiety  DJD Neuropathy S/p fall in 01/2019 >> L1 burst fx, c/b rhabdo, AKI, elevated LFTs Ankylosing spondylitis   The patient is here alone today.  I have not seen him in the office since 2018, but he is followed regularly with our APP's, last seen by Tereso Newcomer in 2022.  He has been doing okay from a cardiac perspective.  He denies any chest pain, chest pressure, or shortness of breath.  He has become more limited by ankylosing spondylitis.  He ambulates with a walking stick to help him stand up straighter.  He notes that he has gained some weight, but reports a healthy diet with whole, nutrient rich foods.  Reports home blood pressure readings typically in the 130s over 70s.  Past Medical History:  Diagnosis Date   Acute hepatitis 01/26/2019   Acute kidney failure (HCC) 03/30/2018   AKI (acute kidney injury) (HCC)    Alcohol use disorder, mild, abuse 10/14/2019   Anginal pain (HCC)    Anxiety    Arthritis    SPINE    Arthritis    Cellulitis 03/30/2018   Coronary artery disease    s/p multiple percutaneous interventions, PCI 202 for DMI and DES distal RCA and CFX-OM 2006   Coronary artery disease    Depression    Depression, major, single episode, mild (HCC) 06/15/2008   Qualifier: Diagnosis of  By: Denyse Amass, CMA, Carol     GERD (gastroesophageal reflux disease)    Hyperlipidemia    mixed   Hyperlipidemia    Hypertension    Major depressive disorder, single episode 06/15/2008    Overview:  Overview:  Qualifier: Diagnosis of  By: Denyse Amass, CMA, Carol    MI (myocardial infarction) (HCC)    Myocardial infarction (HCC)    ? 2006   Numbness and tingling in right hand 07/13/2019   Peripheral neuropathy    calves and both feet   Peripheral neuropathy    calves and both feet   Puncture wound of right foot 03/07/2020   Rhabdomyolysis 02/02/2019   Severe sepsis with acute organ dysfunction (HCC) 12/03/2016   Spondylitis, ankylosing (HCC)     Past Surgical History:  Procedure Laterality Date   ACHILLES TENDON REPAIR     CARDIAC CATHETERIZATION     stent RCA June3, 2002, Stent OM/PTCA circumflex August 13, 2000   CATARACT EXTRACTION Bilateral    CORONARY ANGIOPLASTY WITH STENT PLACEMENT     first one in 2006; had another place 3 months ago (August 2013)   CORONARY ANGIOPLASTY WITH STENT PLACEMENT  03/26/2013   RCA           DR Nohemi Nicklaus   CORONARY ANGIOPLASTY WITH STENT PLACEMENT     CORONARY ARTERY BYPASS GRAFT N/A 10/29/2013   Procedure: CORONARY ARTERY BYPASS GRAFTING x 4 (LIMA-LAD, SVG-OM, SVG-PD-PL) ENDOSCOPIC VEIN HARVEST RIGHT THIGH;  Surgeon: Alleen Borne, MD;  Location: [facility] OR;  Service:  Open Heart Surgery;  Laterality: N/A;   CORONARY ARTERY BYPASS GRAFT     FINGER SURGERY     5  TH     DIGIT RIGHT HAND   FINGER SURGERY     INTRAOPERATIVE TRANSESOPHAGEAL ECHOCARDIOGRAM N/A 10/29/2013   Procedure: INTRAOPERATIVE TRANSESOPHAGEAL ECHOCARDIOGRAM;  Surgeon: Alleen Borne, MD;  Location: [facility] OR;  Service: Open Heart Surgery;  Laterality: N/A;   KNEE ARTHROSCOPY W/ LASER  2003   LEFT HEART CATHETERIZATION WITH CORONARY ANGIOGRAM N/A 10/01/2011   Procedure: LEFT HEART CATHETERIZATION WITH CORONARY ANGIOGRAM;  Surgeon: [name], MD;  Location: [facility] CATH LAB;  Service: Cardiovascular;  Laterality: N/A;   LEFT HEART CATHETERIZATION WITH CORONARY ANGIOGRAM N/A 03/26/2013   Procedure: LEFT HEART CATHETERIZATION WITH CORONARY ANGIOGRAM;  Surgeon: Micheline Chapman, MD;   Location: [facility] CATH LAB;  Service: Cardiovascular;  Laterality: N/A;   LEFT HEART CATHETERIZATION WITH CORONARY ANGIOGRAM N/A 10/25/2013   Procedure: LEFT HEART CATHETERIZATION WITH CORONARY ANGIOGRAM;  Surgeon: Micheline Chapman, MD;  Location: [facility] CATH LAB;  Service: Cardiovascular;  Laterality: N/A;   TONSILLECTOMY      Current Medications: Current Meds  Medication Sig   aspirin 81 MG tablet Take 81 mg by mouth every evening.    buPROPion (WELLBUTRIN XL) 300 MG 24 hr tablet Take 300 mg by mouth daily.   carvedilol (COREG) 12.5 MG tablet TAKE 1 TABLET BY MOUTH TWICE  DAILY WITH MEALS   hydrochlorothiazide (MICROZIDE) 12.5 MG capsule Take 12.5 mg by mouth daily.   IBUPROFEN PO Take by mouth.   nitroGLYCERIN (NITROSTAT) 0.4 MG SL tablet DISSOLVE 1 TAB UNDER TONGUE FOR CHEST PAIN - IF PAIN REMAINS AFTER 5 MIN, CALL 911 AND REPEAT DOSE. MAX 3 TABS IN 15 MINUTES. Please call [phone] to schedule an appointment for future refills. Thank you. 1st attempt.   pantoprazole (PROTONIX) 40 MG tablet Take 1 tablet (40 mg total) by mouth daily.   rosuvastatin (CRESTOR) 20 MG tablet TAKE 1 TABLET BY MOUTH DAILY   sildenafil (REVATIO) 20 MG tablet TAKE 2-5 TABLETS BY MOUTH AS NEEDED PRIOR TO SEXUAL ACTIVITY   Vilazodone HCl (VIIBRYD PO) Take 20 mg by mouth daily.    vitamin B-12 (CYANOCOBALAMIN) 1000 MCG tablet Take 1,000 mcg by mouth daily.     Allergies:   Patient has no known allergies.   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   Drug use: No   Sexual activity: Not  Currently    Birth control/protection: Condom  Other Topics Concern   Not on file  Social History Narrative   ** Merged History Encounter **       Lives in a one level home with son, and dog, Chief Operating Officer. Works out of the house. 2 steps into house. Smoke alarms in house. No grab bars in bathroom. Has some area rugs and throw rugs with backing.      No pets of his own. Restores antique pens, does like to walk and cycle but having problems with peripheral neuropathy.       Firearm is safely stored.      Wears seat belts in vehicle. Does use sunblock and tries to avoid sun due to vitiligo and previous sunburns.      Eats varied diet. Likes protein, fresh fruits and veggies. Watches fats, saturated fats. Drink a lot of water.    Social Determinants of Health   Financial Resource Strain: Low Risk  (07/22/2022)   Overall Financial Resource Strain (CARDIA)    Difficulty of Paying Living Expenses: Not hard at all  Food Insecurity: No Food Insecurity (07/22/2022)   Hunger Vital Sign    Worried About Running Out of Food in the Last Year: Never true    Ran Out of Food in the Last Year: Never true  Transportation Needs: No Transportation Needs (07/22/2022)   PRAPARE - Administrator, Civil Service (Medical): No    Lack of Transportation (Non-Medical): No  Physical Activity: Insufficiently Active (07/22/2022)   Exercise Vital Sign    Days of Exercise per Week: 3 days    Minutes of Exercise per Session: 30 min  Stress: No Stress Concern Present (07/22/2022)    [employer] of Occupational Health - Occupational Stress Questionnaire    Feeling of Stress : Not at all  Social Connections: Socially Isolated (07/22/2022)   Social Connection and Isolation Panel [NHANES]    Frequency of Communication with Friends and Family: More than three times a week    Frequency of Social Gatherings with Friends and Family: Once a week    Attends Religious Services: Never    Database administrator or Organizations: No    Attends Engineer, structural: Never    Marital Status: Divorced     Family History: The patient's family history includes Breast cancer (age of onset: 40) in his mother; Cancer in his brother; Heart attack in his brother; Heart disease in his brother, father, maternal grandfather, and another family member; Leukemia in his maternal grandmother. There is no history of Allergic rhinitis, Angioedema, Asthma, Atopy, Immunodeficiency, Eczema, Urticaria, Colon cancer, Colon polyps, Esophageal cancer, Rectal cancer, or Stomach cancer.  ROS:   Please see the history of present illness.    All other systems reviewed and are negative.  EKGs/Labs/Other Studies Reviewed:    The following studies were reviewed today: Cardiac Studies & Procedures       ECHOCARDIOGRAM  ECHOCARDIOGRAM COMPLETE 10/19/2021  Narrative ECHOCARDIOGRAM REPORT    Patient Name:   Gregory Hansen [facility] Date of Exam: 10/19/2021 Medical Rec #:  [id number]      Height:       74.0 in Accession #:    [id number]     Weight:       255.0 lb Date of Birth:  [birth year]      BSA:          2.410 m Patient Age:    71 years  BP:           82/48 mmHg Patient Gender: M              HR:           64 bpm. Exam Location:  Outpatient  Procedure: 2D Echo, 3D Echo, Color Doppler, Cardiac Doppler and Strain Analysis  Indications:    R55 Syncope  History:        Patient has no prior history of Echocardiogram examinations. Prior CABG; Risk Factors:Hypertension and  Dyslipidemia.  Sonographer:    Irving Burton Senior RDCS Referring Phys: [id number] CARINA M BROWN   Sonographer Comments: BP retaken at end of exam, up to 117/73 IMPRESSIONS   1. Left ventricular ejection fraction, by estimation, is 55 to 60%. Left ventricular ejection fraction by 3D volume is 55 %. The left ventricle has normal function. The left ventricle has no regional wall motion abnormalities. There is mild concentric left ventricular hypertrophy. Left ventricular diastolic parameters are consistent with Grade II diastolic dysfunction (pseudonormalization). The average left ventricular global longitudinal strain is -20.0 %. The global longitudinal strain is normal. 2. Right ventricular systolic function is normal. The right ventricular size is normal. There is normal pulmonary artery systolic pressure. 3. Left atrial size was mildly dilated. 4. The mitral valve is grossly normal. No evidence of mitral valve regurgitation. No evidence of mitral stenosis. 5. The aortic valve is tricuspid. There is mild calcification of the aortic valve. Aortic valve regurgitation is trivial. No aortic stenosis is present. 6. The inferior vena cava is normal in size with greater than 50% respiratory variability, suggesting right atrial pressure of 3 mmHg.  Comparison(s): No prior Echocardiogram.  FINDINGS Left Ventricle: Left ventricular ejection fraction, by estimation, is 55 to 60%. Left ventricular ejection fraction by 3D volume is 55 %. The left ventricle has normal function. The left ventricle has no regional wall motion abnormalities. The average left ventricular global longitudinal strain is -20.0 %. The global longitudinal strain is normal. The left ventricular internal cavity size was normal in size. There is mild concentric left ventricular hypertrophy. Left ventricular diastolic parameters are consistent with Grade II diastolic dysfunction (pseudonormalization).  Right Ventricle: The right ventricular  size is normal. No increase in right ventricular wall thickness. Right ventricular systolic function is normal. There is normal pulmonary artery systolic pressure. The tricuspid regurgitant velocity is 1.90 m/s, and with an assumed right atrial pressure of 3 mmHg, the estimated right ventricular systolic pressure is 17.4 mmHg.  Left Atrium: Left atrial size was mildly dilated.  Right Atrium: Right atrial size was normal in size.  Pericardium: There is no evidence of pericardial effusion.  Mitral Valve: The mitral valve is grossly normal. No evidence of mitral valve regurgitation. No evidence of mitral valve stenosis.  Tricuspid Valve: The tricuspid valve is normal in structure. Tricuspid valve regurgitation is trivial. No evidence of tricuspid stenosis.  Aortic Valve: The aortic valve is tricuspid. There is mild calcification of the aortic valve. There is mild aortic valve annular calcification. Aortic valve regurgitation is trivial. No aortic stenosis is present.  Pulmonic Valve: The pulmonic valve was normal in structure. Pulmonic valve regurgitation is mild to moderate.  Aorta: The aortic root, ascending aorta, aortic arch and descending aorta are all structurally normal, with no evidence of dilitation or obstruction.  Venous: The inferior vena cava is normal in size with greater than 50% respiratory variability, suggesting right atrial pressure of 3 mmHg.  IAS/Shunts: No atrial level shunt detected by  color flow Doppler.   LEFT VENTRICLE PLAX 2D LVIDd:         4.50 cm         Diastology LVIDs:         3.10 cm         LV e' medial:    5.22 cm/s LV PW:         1.30 cm         LV E/e' medial:  15.8 LV IVS:        1.10 cm         LV e' lateral:   4.03 cm/s LVOT diam:     2.60 cm         LV E/e' lateral: 20.5 LV SV:         102 LV SV Index:   42              2D LVOT Area:     5.31 cm        Longitudinal Strain 2D Strain GLS  -20.0 % Avg:  3D Volume EF LV 3D EF:     Left ventricul ar ejection fraction by 3D volume is 55 %.  3D Volume EF: 3D EF:        55 % LV EDV:       182 ml LV ESV:       82 ml LV SV:        100 ml  RIGHT VENTRICLE RV S prime:     8.49 cm/s TAPSE (M-mode): 1.7 cm  LEFT ATRIUM             Index        RIGHT ATRIUM           Index LA diam:        5.20 cm 2.16 cm/m   RA Area:     13.40 cm LA Vol (A2C):   88.8 ml 36.85 ml/m  RA Volume:   28.10 ml  11.66 ml/m LA Vol (A4C):   92.0 ml 38.18 ml/m LA Biplane Vol: 93.1 ml 38.63 ml/m AORTIC VALVE LVOT Vmax:   87.30 cm/s LVOT Vmean:  58.000 cm/s LVOT VTI:    0.192 m  AORTA Ao Root diam: 3.90 cm Ao Asc diam:  3.70 cm  MITRAL VALVE               TRICUSPID VALVE MV Area (PHT): 2.74 cm    TR Peak grad:   14.4 mmHg MV Decel Time: 277 msec    TR Vmax:        190.00 cm/s MV E velocity: 82.70 cm/s MV A velocity: 73.90 cm/s  SHUNTS MV E/A ratio:  1.12        Systemic VTI:  0.19 m Systemic Diam: 2.60 cm  Riley Lam MD Electronically signed by Riley Lam MD Signature Date/Time: 10/19/2021/10:36:46 AM    Final              EKG:   EKG Interpretation Date/Time:  Monday September 30 2022 09:50:17 EDT Ventricular Rate:  69 PR Interval:  160 QRS Duration:  118 QT Interval:  434 QTC Calculation: 465 R Axis:   99  Text Interpretation: Normal sinus rhythm Right bundle branch block When compared with ECG of 16-Oct-2021 15:32, Right bundle branch block has replaced Incomplete right bundle branch block Confirmed by [name] [phone]) on 09/30/2022 9:58:25 AM    Recent Labs: 10/16/2021: ALT 24; Hemoglobin 9.7; Platelets 140 11/05/2021: BUN 14; Creatinine,  Ser 1.13; Potassium 3.6; Sodium 135  Recent Lipid Panel    Component Value Date/Time   CHOL 149 02/25/2019 1646   TRIG 90 02/25/2019 1646   HDL 31 (L) 02/25/2019 1646   CHOLHDL 4.8 02/25/2019 1646   CHOLHDL 2.8 05/25/2015 0937   VLDL 29 05/25/2015 0937   LDLCALC 101 (H) 02/25/2019 1646    LDLDIRECT 57 10/14/2019 1221   LDLDIRECT 89.7 08/01/2011 0921     Risk Assessment/Calculations:           Physical Exam:    VS:  BP (!) 140/82   Pulse 69   Ht 6\' 2"  (1.88 m)   Wt 253 lb 6.4 oz (114.9 kg)   SpO2 99%   BMI 32.53 kg/m     Wt Readings from Last 3 Encounters:  09/30/22 253 lb 6.4 oz (114.9 kg)  07/22/22 249 lb (112.9 kg)  05/02/22 249 lb (112.9 kg)     GEN:  Well nourished, well developed in no acute distress HEENT: Normal NECK: No JVD; No carotid bruits LYMPHATICS: No lymphadenopathy CARDIAC: RRR, no murmurs, rubs, gallops RESPIRATORY:  Clear to auscultation without rales, wheezing or rhonchi  ABDOMEN: Soft, non-tender, non-distended.  Prominent chest wall veins noted. MUSCULOSKELETAL:  No edema; No deformity  SKIN: Warm and dry.  Lower extremity varicosities noted. NEUROLOGIC:  Alert and oriented x 3 PSYCHIATRIC:  Normal affect   ASSESSMENT:    1. HTN, goal below 140/90   2. Aortic atherosclerosis (HCC)   3. Coronary artery disease involving native coronary artery of native heart without angina pectoris   4. Mixed hyperlipidemia   5. Essential hypertension    PLAN:    In order of problems listed above:  Home blood pressure readings at goal.  Treated with carvedilol and hydrochlorothiazide.  Reports good compliance with this.  Follow-up 1 year.  Check metabolic panel today. Treated with aspirin and a statin drug. Remains angina-free ever since CABG.  No changes in his medical program today. Treated with rosuvastatin 20 mg daily.  Last LDL cholesterol was 57 back in 2021.  Check lipids today. As above     Medication Adjustments/Labs and Tests Ordered: Current medicines are reviewed at length with the patient today.  Concerns regarding medicines are outlined above.  Orders Placed This Encounter  Procedures   EKG 12-Lead   No orders of the defined types were placed in this encounter.   There are no Patient Instructions on file for this  visit.   Signed, [name], MD  09/30/2022 10:07 AM    [facility]

## 2022-09-30 NOTE — Patient Instructions (Signed)
Medication Instructions:  Your physician recommends that you continue on your current medications as directed. Please refer to the Current Medication list given to you today.  *If you need a refill on your cardiac medications before your next appointment, please call your pharmacy*   Lab Work: CBC, CMET, Lipids today If you have labs (blood work) drawn today and your tests are completely normal, you will receive your results only by: [place] (if you have MyChart) OR A paper copy in the mail If you have any lab test that is abnormal or we need to change your treatment, we will call you to review the results.   Testing/Procedures: NONE   Follow-Up: At [facility], you and your health needs are our priority.  As part of our continuing mission to provide you with exceptional heart care, we have created designated Provider Care Teams.  These Care Teams include your primary Cardiologist (physician) and Advanced Practice Providers (APPs -  Physician Assistants and Nurse Practitioners) who all work together to provide you with the care you need, when you need it.  We recommend signing up for the patient portal called "MyChart".  Sign up information is provided on this After Visit Summary.  MyChart is used to connect with patients for Virtual Visits (Telemedicine).  Patients are able to view lab/test results, encounter notes, upcoming appointments, etc.  Non-urgent messages can be sent to your provider as well.   To learn more about what you can do with MyChart, go to [email or web address].    Your next appointment:   1 year(s)  Provider:   [name], MD

## 2022-10-01 LAB — COMPREHENSIVE METABOLIC PANEL
ALT: 23 IU/L (ref 0–44)
AST: 37 IU/L (ref 0–40)
Albumin: 3.7 g/dL — ABNORMAL LOW (ref 3.8–4.8)
Alkaline Phosphatase: 74 IU/L (ref 44–121)
BUN/Creatinine Ratio: 9 — ABNORMAL LOW (ref 10–24)
BUN: 11 mg/dL (ref 8–27)
Bilirubin Total: 0.3 mg/dL (ref 0.0–1.2)
CO2: 25 mmol/L (ref 20–29)
Calcium: 9.1 mg/dL (ref 8.6–10.2)
Chloride: 98 mmol/L (ref 96–106)
Creatinine, Ser: 1.26 mg/dL (ref 0.76–1.27)
Globulin, Total: 3.5 g/dL (ref 1.5–4.5)
Glucose: 94 mg/dL (ref 70–99)
Potassium: 4.1 mmol/L (ref 3.5–5.2)
Sodium: 135 mmol/L (ref 134–144)
Total Protein: 7.2 g/dL (ref 6.0–8.5)
eGFR: 60 mL/min/{1.73_m2} (ref >59)

## 2022-10-01 LAB — CBC
Hematocrit: 32.9 % — ABNORMAL LOW (ref 37.5–51.0)
Hemoglobin: 9.6 g/dL — ABNORMAL LOW (ref 13.0–17.7)
MCH: 22.8 pg — ABNORMAL LOW (ref 26.6–33.0)
MCHC: 29.2 g/dL — ABNORMAL LOW (ref 31.5–35.7)
MCV: 78 fL — ABNORMAL LOW (ref 79–97)
Platelets: 195 10*3/uL (ref 150–450)
RBC: 4.21 x10E6/uL (ref 4.14–5.80)
RDW: 18.3 % — ABNORMAL HIGH (ref 11.6–15.4)
WBC: 6.2 10*3/uL (ref 3.4–10.8)

## 2022-10-01 LAB — LIPID PANEL
Chol/HDL Ratio: 1.8 ratio (ref 0.0–5.0)
Cholesterol, Total: 100 mg/dL (ref 100–199)
HDL: 57 mg/dL (ref >39)
LDL Chol Calc (NIH): 28 mg/dL (ref 0–99)
Triglycerides: 74 mg/dL (ref 0–149)
VLDL Cholesterol Cal: 15 mg/dL (ref 5–40)

## 2022-10-10 ENCOUNTER — Other Ambulatory Visit: Payer: Self-pay

## 2022-10-10 MED ORDER — PANTOPRAZOLE SODIUM 40 MG PO TBEC: 40.0000 mg | DELAYED_RELEASE_TABLET | Freq: Every day | ORAL | 2 refills | Status: DC

## 2022-10-18 ENCOUNTER — Other Ambulatory Visit: Payer: Self-pay

## 2022-10-21 MED ORDER — HYDROCHLOROTHIAZIDE 12.5 MG PO CAPS: 12.5000 mg | ORAL_CAPSULE | Freq: Every day | ORAL | 1 refills | Status: DC

## 2022-11-20 ENCOUNTER — Other Ambulatory Visit: Payer: Self-pay | Admitting: Cardiovascular Disease

## 2022-11-21 ENCOUNTER — Other Ambulatory Visit: Payer: Self-pay

## 2022-11-21 MED ORDER — ROSUVASTATIN CALCIUM 20 MG PO TABS: 20.0000 mg | ORAL_TABLET | Freq: Every day | ORAL | 2 refills | Status: DC

## 2022-12-02 ENCOUNTER — Encounter: Payer: Self-pay | Admitting: Student

## 2022-12-04 ENCOUNTER — Other Ambulatory Visit: Payer: Self-pay

## 2022-12-04 MED ORDER — HYDROCHLOROTHIAZIDE 12.5 MG PO CAPS: 12.5000 mg | ORAL_CAPSULE | Freq: Every day | ORAL | 1 refills | Status: DC

## 2022-12-13 ENCOUNTER — Other Ambulatory Visit: Payer: Self-pay | Admitting: Physician Assistant

## 2022-12-16 ENCOUNTER — Encounter: Payer: Self-pay | Admitting: Rheumatology

## 2022-12-17 NOTE — Progress Notes (Unsigned)
Office Visit Note  Patient: Gregory Hansen             Date of Birth: 11-19-1950           MRN: [id number]             PCP: Tiffany Kocher, DO Referring: Tiffany Kocher, DO Visit Date: 12/18/2022 Occupation: @GUAROCC @  Subjective:  No chief complaint on file.   History of Present Illness: Gregory Hansen is a 72 y.o. male ***with ankylosing spondylitis, osteoarthritis and degenerative disc disease.  He returns today after his last visit in February 2024.    Activities of Daily Living:  Patient reports morning stiffness for *** {minute/hour:19697}.   Patient {ACTIONS;DENIES/REPORTS:21021675::"Denies"} nocturnal pain.  Difficulty dressing/grooming: {ACTIONS;DENIES/REPORTS:21021675::"Denies"} Difficulty climbing stairs: {ACTIONS;DENIES/REPORTS:21021675::"Denies"} Difficulty getting out of chair: {ACTIONS;DENIES/REPORTS:21021675::"Denies"} Difficulty using hands for taps, buttons, cutlery, and/or writing: {ACTIONS;DENIES/REPORTS:21021675::"Denies"}  No Rheumatology ROS completed.   PMFS History:  Patient Active Problem List   Diagnosis Date Noted   Chronic bilateral low back pain without sciatica 07/13/2019   Peripheral neuropathy 11/04/2017   Gynecomastia, male 02/18/2017   Healthcare maintenance 01/20/2017   Aortic atherosclerosis (HCC) 12/03/2016   History of cryptosporidiosis    S/P CABG x 4 10/29/2013   Hyperlipidemia 06/15/2008   HTN, goal below 140/90 06/15/2008   CAD (coronary artery disease) 06/15/2008   SPONDYLITIS, ANKYLOSING 06/15/2008   Atherosclerotic heart disease of native coronary artery without angina pectoris 06/15/2008   Gastro-esophageal reflux disease without esophagitis 06/15/2008    Past Medical History:  Diagnosis Date   Acute hepatitis 01/26/2019   Acute kidney failure (HCC) 03/30/2018   AKI (acute kidney injury) (HCC)    Alcohol use disorder, mild, abuse 10/14/2019   Anginal pain (HCC)    Anxiety    Arthritis    SPINE    Arthritis     Cellulitis 03/30/2018   Coronary artery disease    s/p multiple percutaneous interventions, PCI 202 for DMI and DES distal RCA and CFX-OM 2006   Coronary artery disease    Depression    Depression, major, single episode, mild (HCC) 06/15/2008   Qualifier: Diagnosis of  By: Denyse Amass, CMA, Carol     GERD (gastroesophageal reflux disease)    Hyperlipidemia    mixed   Hyperlipidemia    Hypertension    Major depressive disorder, single episode 06/15/2008   Overview:  Overview:  Qualifier: Diagnosis of  By: Denyse Amass, CMA, Carol    MI (myocardial infarction) (HCC)    Myocardial infarction (HCC)    ? 2006   Numbness and tingling in right hand 07/13/2019   Peripheral neuropathy    calves and both feet   Peripheral neuropathy    calves and both feet   Puncture wound of right foot 03/07/2020   Rhabdomyolysis 02/02/2019   Severe sepsis with acute organ dysfunction (HCC) 12/03/2016   Spondylitis, ankylosing (HCC)     Family History  Problem Relation Age of Onset   Breast cancer Mother 10   Heart disease Father    Cancer Brother    Heart disease Brother    Heart attack Brother    Leukemia Maternal Grandmother    Heart disease Maternal Grandfather    Heart disease Other        positive for cardiac disease and both brothers have had cardiac events   Allergic rhinitis Neg Hx    Angioedema Neg Hx    Asthma Neg Hx    Atopy Neg Hx    Immunodeficiency Neg Hx  Eczema Neg Hx    Urticaria Neg Hx    Colon cancer Neg Hx    Colon polyps Neg Hx    Esophageal cancer Neg Hx    Rectal cancer Neg Hx    Stomach cancer Neg Hx    Past Surgical History:  Procedure Laterality Date   ACHILLES TENDON REPAIR     CARDIAC CATHETERIZATION     stent RCA June3, 2002, Stent OM/PTCA circumflex August 13, 2000   CATARACT EXTRACTION Bilateral    CORONARY ANGIOPLASTY WITH STENT PLACEMENT     first one in 2006; had another place 3 months ago (August 2013)   CORONARY ANGIOPLASTY WITH STENT PLACEMENT  03/26/2013   RCA            DR COOPER   CORONARY ANGIOPLASTY WITH STENT PLACEMENT     CORONARY ARTERY BYPASS GRAFT N/A 10/29/2013   Procedure: CORONARY ARTERY BYPASS GRAFTING x 4 (LIMA-LAD, SVG-OM, SVG-PD-PL) ENDOSCOPIC VEIN HARVEST RIGHT THIGH;  Surgeon: Alleen Borne, MD;  Location: [facility] OR;  Service: Open Heart Surgery;  Laterality: N/A;   CORONARY ARTERY BYPASS GRAFT     FINGER SURGERY     5  TH     DIGIT RIGHT HAND   FINGER SURGERY     INTRAOPERATIVE TRANSESOPHAGEAL ECHOCARDIOGRAM N/A 10/29/2013   Procedure: INTRAOPERATIVE TRANSESOPHAGEAL ECHOCARDIOGRAM;  Surgeon: Alleen Borne, MD;  Location: [facility] OR;  Service: Open Heart Surgery;  Laterality: N/A;   KNEE ARTHROSCOPY W/ LASER  2003   LEFT HEART CATHETERIZATION WITH CORONARY ANGIOGRAM N/A 10/01/2011   Procedure: LEFT HEART CATHETERIZATION WITH CORONARY ANGIOGRAM;  Surgeon: [name], MD;  Location: [facility] CATH LAB;  Service: Cardiovascular;  Laterality: N/A;   LEFT HEART CATHETERIZATION WITH CORONARY ANGIOGRAM N/A 03/26/2013   Procedure: LEFT HEART CATHETERIZATION WITH CORONARY ANGIOGRAM;  Surgeon: Micheline Chapman, MD;  Location: [facility] CATH LAB;  Service: Cardiovascular;  Laterality: N/A;   LEFT HEART CATHETERIZATION WITH CORONARY ANGIOGRAM N/A 10/25/2013   Procedure: LEFT HEART CATHETERIZATION WITH CORONARY ANGIOGRAM;  Surgeon: Micheline Chapman, MD;  Location: [facility] CATH LAB;  Service: Cardiovascular;  Laterality: N/A;   TONSILLECTOMY     Social History   Social History Narrative   ** Merged History Encounter **       Lives in a one level home with son, and dog, Chief Operating Officer. Works out of the house. 2 steps into house. Smoke alarms in house. No grab bars in bathroom. Has some area rugs and throw rugs with backing.      No pets of his own. Restores antique pens, does like to walk and cycle but having problems with peripheral neuropathy.       Firearm is safely stored.      Wears seat belts in vehicle. Does use sunblock and tries to avoid sun due to vitiligo and  previous sunburns.      Eats varied diet. Likes protein, fresh fruits and veggies. Watches fats, saturated fats. Drink a lot of water.    Immunization History  Administered Date(s) Administered   Fluad Quad(high Dose 65+) 12/05/2022   Influenza, High Dose Seasonal PF 11/23/2016   Influenza,inj,Quad PF,6+ Mos 12/18/2017, 11/20/2018   Influenza-Unspecified 12/02/2019, 12/01/2020, 12/10/2021   PFIZER(Purple Top)SARS-COV-2 Vaccination 05/03/2019, 05/24/2019, 12/07/2019, 06/06/2020   Pfizer Covid-19 Vaccine Bivalent Booster 86yrs & up 12/01/2020   Pfizer(Comirnaty)Fall Seasonal Vaccine 12 years and older 12/05/2022   Pneumococcal Conjugate-13 01/20/2017   Pneumococcal Polysaccharide-23 07/01/2015, 04/01/2018   Tdap 06/30/2015   Unspecified SARS-COV-2 Vaccination 12/10/2021  Zoster Recombinant(Shingrix) 06/28/2019, 09/13/2019     Objective: Vital Signs: There were no vitals taken for this visit.   Physical Exam   Musculoskeletal Exam: ***  CDAI Exam: CDAI Score: -- Patient Global: --; Provider Global: -- Swollen: --; Tender: -- Joint Exam 12/18/2022   No joint exam has been documented for this visit   There is currently no information documented on the homunculus. Go to the Rheumatology activity and complete the homunculus joint exam.  Investigation: No additional findings.  Imaging: No results found.  Recent Labs: Lab Results  Component Value Date   WBC 6.2 09/30/2022   HGB 9.6 (L) 09/30/2022   PLT 195 09/30/2022   NA 135 09/30/2022   K 4.1 09/30/2022   CL 98 09/30/2022   CO2 25 09/30/2022   GLUCOSE 94 09/30/2022   BUN 11 09/30/2022   CREATININE 1.26 09/30/2022   BILITOT 0.3 09/30/2022   ALKPHOS 74 09/30/2022   AST 37 09/30/2022   ALT 23 09/30/2022   PROT 7.2 09/30/2022   ALBUMIN 3.7 (L) 09/30/2022   CALCIUM 9.1 09/30/2022   GFRAA 105 07/24/2020   QFTBGOLDPLUS NEGATIVE 11/10/2019    Speciality Comments: Enbrel started December 09, 2019, Humira  04/22-07/22  Procedures:  No procedures performed Allergies: Patient has no known allergies.   Assessment / Plan:     Visit Diagnoses: Ankylosing spondylitis, unspecified site of spine (HCC) - Ttd with Enbrel in 2021 at [facility] 2022 with no improvement in symptoms.  Patient declined immunosuppressive therapy in the past.  History of iritis - No recurrence in over 20 years.  Primary osteoarthritis of both knees - He has bilateral severe osteoarthritis and severe chondromalacia patella without much discomfort.  Chronic SI joint pain  DDD (degenerative disc disease), thoracic  Arthropathy of lumbar facet joint  History of vertebral fracture - L1 vertebral fracture November 2020 after an injury.  Osteopenia of multiple sites - May 13, 2022 the BMD measured at [facility] Left is 0.856 g/cm2 with a T-score of -1.6.  Coronary artery disease involving native coronary artery of native heart without angina pectoris  S/P CABG x 4  Essential hypertension  Chronic anemia  Gastro-esophageal reflux disease without esophagitis  Peripheral polyneuropathy  IgA deficiency, selective (HCC)  Vitiligo  History of depression  History of cryptosporidiosis  History of alcohol use disorder  Orders: No orders of the defined types were placed in this encounter.  No orders of the defined types were placed in this encounter.   Face-to-face time spent with patient was *** minutes. Greater than 50% of time was spent in counseling and coordination of care.  Follow-Up Instructions: No follow-ups on file.   Pollyann Savoy, MD  Note - This record has been created using Animal nutritionist.  Chart creation errors have been sought, but may not always  have been located. Such creation errors do not reflect on  the standard of medical care.

## 2022-12-18 ENCOUNTER — Ambulatory Visit (INDEPENDENT_AMBULATORY_CARE_PROVIDER_SITE_OTHER): Payer: Medicare Other

## 2022-12-18 ENCOUNTER — Encounter: Payer: Self-pay | Admitting: Rheumatology

## 2022-12-18 ENCOUNTER — Ambulatory Visit: Payer: Medicare Other | Attending: Rheumatology | Admitting: Rheumatology

## 2022-12-18 VITALS — BP 179/96 | HR 74 | Resp 17 | Ht 74.0 in | Wt 252.2 lb

## 2022-12-18 DIAGNOSIS — Z8619 Personal history of other infectious and parasitic diseases: Secondary | ICD-10-CM

## 2022-12-18 DIAGNOSIS — Z87898 Personal history of other specified conditions: Secondary | ICD-10-CM

## 2022-12-18 DIAGNOSIS — M533 Sacrococcygeal disorders, not elsewhere classified: Secondary | ICD-10-CM

## 2022-12-18 DIAGNOSIS — M25511 Pain in right shoulder: Secondary | ICD-10-CM

## 2022-12-18 DIAGNOSIS — M5442 Lumbago with sciatica, left side: Secondary | ICD-10-CM

## 2022-12-18 DIAGNOSIS — G629 Polyneuropathy, unspecified: Secondary | ICD-10-CM

## 2022-12-18 DIAGNOSIS — M170 Bilateral primary osteoarthritis of knee: Secondary | ICD-10-CM

## 2022-12-18 DIAGNOSIS — Z8659 Personal history of other mental and behavioral disorders: Secondary | ICD-10-CM

## 2022-12-18 DIAGNOSIS — Z8781 Personal history of (healed) traumatic fracture: Secondary | ICD-10-CM | POA: Diagnosis not present

## 2022-12-18 DIAGNOSIS — M8589 Other specified disorders of bone density and structure, multiple sites: Secondary | ICD-10-CM | POA: Diagnosis not present

## 2022-12-18 DIAGNOSIS — M542 Cervicalgia: Secondary | ICD-10-CM | POA: Diagnosis not present

## 2022-12-18 DIAGNOSIS — M459 Ankylosing spondylitis of unspecified sites in spine: Secondary | ICD-10-CM

## 2022-12-18 DIAGNOSIS — Z8669 Personal history of other diseases of the nervous system and sense organs: Secondary | ICD-10-CM

## 2022-12-18 DIAGNOSIS — G8929 Other chronic pain: Secondary | ICD-10-CM

## 2022-12-18 DIAGNOSIS — K219 Gastro-esophageal reflux disease without esophagitis: Secondary | ICD-10-CM

## 2022-12-18 DIAGNOSIS — M5134 Other intervertebral disc degeneration, thoracic region: Secondary | ICD-10-CM | POA: Diagnosis not present

## 2022-12-18 DIAGNOSIS — M47816 Spondylosis without myelopathy or radiculopathy, lumbar region: Secondary | ICD-10-CM

## 2022-12-18 DIAGNOSIS — D649 Anemia, unspecified: Secondary | ICD-10-CM

## 2022-12-18 DIAGNOSIS — I2510 Atherosclerotic heart disease of native coronary artery without angina pectoris: Secondary | ICD-10-CM

## 2022-12-18 DIAGNOSIS — D802 Selective deficiency of immunoglobulin A [IgA]: Secondary | ICD-10-CM

## 2022-12-18 DIAGNOSIS — Z951 Presence of aortocoronary bypass graft: Secondary | ICD-10-CM

## 2022-12-18 DIAGNOSIS — I10 Essential (primary) hypertension: Secondary | ICD-10-CM

## 2022-12-18 DIAGNOSIS — L80 Vitiligo: Secondary | ICD-10-CM

## 2022-12-18 NOTE — Patient Instructions (Addendum)
Shoulder Exercises Ask your health care provider which exercises are safe for you. Do exercises exactly as told by your health care provider and adjust them as directed. It is normal to feel mild stretching, pulling, tightness, or discomfort as you do these exercises. Stop right away if you feel sudden pain or your pain gets worse. Do not begin these exercises until told by your health care provider. Stretching exercises External rotation and abduction This exercise is sometimes called corner stretch. The exercise rotates your arm outward (external rotation) and moves your arm out from your body (abduction). Stand in a doorway with one of your feet slightly in front of the other. This is called a staggered stance. If you cannot reach your forearms to the door frame, stand facing a corner of a room. Choose one of the following positions as told by your health care provider: Place your hands and forearms on the door frame above your head. Place your hands and forearms on the door frame at the height of your head. Place your hands on the door frame at the height of your elbows. Slowly move your weight onto your front foot until you feel a stretch across your chest and in the front of your shoulders. Keep your head and chest upright and keep your abdominal muscles tight. Hold for __________ seconds. To release the stretch, shift your weight to your back foot. Repeat __________ times. Complete this exercise __________ times a day. Extension, standing  Stand and hold a broomstick, a cane, or a similar object behind your back. Your hands should be a little wider than shoulder-width apart. Your palms should face away from your back. Keeping your elbows straight and your shoulder muscles relaxed, move the stick away from your body until you feel a stretch in your shoulders (extension). Avoid shrugging your shoulders while you move the stick. Keep your shoulder blades tucked down toward the middle of your  back. Hold for __________ seconds. Slowly return to the starting position. Repeat __________ times. Complete this exercise __________ times a day. Range-of-motion exercises Pendulum  Stand near a wall or a surface that you can hold onto for balance. Bend at the waist and let your left / right arm hang straight down. Use your other arm to support you. Keep your back straight and do not lock your knees. Relax your left / right arm and shoulder muscles, and move your hips and your trunk so your left / right arm swings freely. Your arm should swing because of the motion of your body, not because you are using your arm or shoulder muscles. Keep moving your hips and trunk so your arm swings in the following directions, as told by your health care provider: Side to side. Forward and backward. In clockwise and counterclockwise circles. Continue each motion for __________ seconds, or for as long as told by your health care provider. Slowly return to the starting position. Repeat __________ times. Complete this exercise __________ times a day. Shoulder flexion, standing  Stand and hold a broomstick, a cane, or a similar object. Place your hands a little more than shoulder-width apart on the object. Your left / right hand should be palm-up, and your other hand should be palm-down. Keep your elbow straight and your shoulder muscles relaxed. Push the stick up with your healthy arm to raise your left / right arm in front of your body, and then over your head until you feel a stretch in your shoulder (flexion). Avoid shrugging your shoulder  while you raise your arm. Keep your shoulder blade tucked down toward the middle of your back. Hold for __________ seconds. Slowly return to the starting position. Repeat __________ times. Complete this exercise __________ times a day. Shoulder abduction, standing  Stand and hold a broomstick, a cane, or a similar object. Place your hands a little more than  shoulder-width apart on the object. Your left / right hand should be palm-up, and your other hand should be palm-down. Keep your elbow straight and your shoulder muscles relaxed. Push the object across your body toward your left / right side. Raise your left / right arm to the side of your body (abduction) until you feel a stretch in your shoulder. Do not raise your arm above shoulder height unless your health care provider tells you to do that. If directed, raise your arm over your head. Avoid shrugging your shoulder while you raise your arm. Keep your shoulder blade tucked down toward the middle of your back. Hold for __________ seconds. Slowly return to the starting position. Repeat __________ times. Complete this exercise __________ times a day. Internal rotation  Place your left / right hand behind your back, palm-up. Use your other hand to dangle an exercise band, a broomstick, or a similar object over your shoulder. Grasp the band with your left / right hand so you are holding on to both ends. Gently pull up on the band until you feel a stretch in the front of your left / right shoulder. The movement of your arm toward the center of your body is called internal rotation. Avoid shrugging your shoulder while you raise your arm. Keep your shoulder blade tucked down toward the middle of your back. Hold for __________ seconds. Release the stretch by letting go of the band and lowering your hands. Repeat __________ times. Complete this exercise __________ times a day. Strengthening exercises External rotation  Sit in a stable chair without armrests. Secure an exercise band to a stable object at elbow height on your left / right side. Place a soft object, such as a folded towel or a small pillow, between your left / right upper arm and your body to move your elbow about 4 inches (10 cm) away from your side. Hold the end of the exercise band so it is tight and there is no slack. Keeping your  elbow pressed against the soft object, slowly move your forearm out, away from your abdomen (external rotation). Keep your body steady so only your forearm moves. Hold for __________ seconds. Slowly return to the starting position. Repeat __________ times. Complete this exercise __________ times a day. Shoulder abduction  Sit in a stable chair without armrests, or stand up. Hold a __________ lb / kg weight in your left / right hand, or hold an exercise band with both hands. Start with your arms straight down and your left / right palm facing in, toward your body. Slowly lift your left / right hand out to your side (abduction). Do not lift your hand above shoulder height unless your health care provider tells you that this is safe. Keep your arms straight. Avoid shrugging your shoulder while you do this movement. Keep your shoulder blade tucked down toward the middle of your back. Hold for __________ seconds. Slowly lower your arm, and return to the starting position. Repeat __________ times. Complete this exercise __________ times a day. Shoulder extension  Sit in a stable chair without armrests, or stand up. Secure an exercise band to a  stable object in front of you so it is at shoulder height. Hold one end of the exercise band in each hand. Straighten your elbows and lift your hands up to shoulder height. Squeeze your shoulder blades together as you pull your hands down to the sides of your thighs (extension). Stop when your hands are straight down by your sides. Do not let your hands go behind your body. Hold for __________ seconds. Slowly return to the starting position. Repeat __________ times. Complete this exercise __________ times a day. Shoulder row  Sit in a stable chair without armrests, or stand up. Secure an exercise band to a stable object in front of you so it is at chest height. Hold one end of the exercise band in each hand. Position your palms so that your thumbs are  facing the ceiling (neutral position). Bend each of your elbows to a 90-degree angle (right angle) and keep your upper arms at your sides. Step back or move the chair back until the band is tight and there is no slack. Slowly pull your elbows back behind you. Hold for __________ seconds. Slowly return to the starting position. Repeat __________ times. Complete this exercise __________ times a day. Shoulder press-ups  Sit in a stable chair that has armrests. Sit upright, with your feet flat on the floor. Put your hands on the armrests so your elbows are bent and your fingers are pointing forward. Your hands should be about even with the sides of your body. Push down on the armrests and use your arms to lift yourself off the chair. Straighten your elbows and lift yourself up as much as you comfortably can. Move your shoulder blades down, and avoid letting your shoulders move up toward your ears. Keep your feet on the ground. As you get stronger, your feet should support less of your body weight as you lift yourself up. Hold for __________ seconds. Slowly lower yourself back into the chair. Repeat __________ times. Complete this exercise __________ times a day. Wall push-ups  Stand so you are facing a stable wall. Your feet should be about one arm-length away from the wall. Lean forward and place your palms on the wall at shoulder height. Keep your feet flat on the floor as you bend your elbows and lean forward toward the wall. Hold for __________ seconds. Straighten your elbows to push yourself back to the starting position. Repeat __________ times. Complete this exercise __________ times a day. This information is not intended to replace advice given to you by your health care provider. Make sure you discuss any questions you have with your health care provider. Document Revised: 04/17/2021 Document Reviewed: 04/17/2021 Elsevier Patient Education  2024 [employer] Low Back Sprain or  Strain Rehab Ask your health care provider which exercises are safe for you. Do exercises exactly as told by your health care provider and adjust them as directed. It is normal to feel mild stretching, pulling, tightness, or discomfort as you do these exercises. Stop right away if you feel sudden pain or your pain gets worse. Do not begin these exercises until told by your health care provider. Stretching and range-of-motion exercises These exercises warm up your muscles and joints and improve the movement and flexibility of your back. These exercises also help to relieve pain, numbness, and tingling. Lumbar rotation  Lie on your back on a firm bed or the floor with your knees bent. Straighten your arms out to your sides so each arm forms a 90-degree  angle (right angle) with a side of your body. Slowly move (rotate) both of your knees to one side of your body until you feel a stretch in your lower back (lumbar). Try not to let your shoulders lift off the floor. Hold this position for __________ seconds. Tense your abdominal muscles and slowly move your knees back to the starting position. Repeat this exercise on the other side of your body. Repeat __________ times. Complete this exercise __________ times a day. Single knee to chest  Lie on your back on a firm bed or the floor with both legs straight. Bend one of your knees. Use your hands to move your knee up toward your chest until you feel a gentle stretch in your lower back and buttock. Hold your leg in this position by holding on to the front of your knee. Keep your other leg as straight as possible. Hold this position for __________ seconds. Slowly return to the starting position. Repeat with your other leg. Repeat __________ times. Complete this exercise __________ times a day. Prone extension on elbows  Lie on your abdomen on a firm bed or the floor (prone position). Prop yourself up on your elbows. Use your arms to help lift your  chest up until you feel a gentle stretch in your abdomen and your lower back. This will place some of your body weight on your elbows. If this is uncomfortable, try stacking pillows under your chest. Your hips should stay down, against the surface that you are lying on. Keep your hip and back muscles relaxed. Hold this position for __________ seconds. Slowly relax your upper body and return to the starting position. Repeat __________ times. Complete this exercise __________ times a day. Strengthening exercises These exercises build strength and endurance in your back. Endurance is the ability to use your muscles for a long time, even after they get tired. Pelvic tilt This exercise strengthens the muscles that lie deep in the abdomen. Lie on your back on a firm bed or the floor with your legs extended. Bend your knees so they are pointing toward the ceiling and your feet are flat on the floor. Tighten your lower abdominal muscles to press your lower back against the floor. This motion will tilt your pelvis so your tailbone points up toward the ceiling instead of pointing to your feet or the floor. To help with this exercise, you may place a small towel under your lower back and try to push your back into the towel. Hold this position for __________ seconds. Let your muscles relax completely before you repeat this exercise. Repeat __________ times. Complete this exercise __________ times a day. Alternating arm and leg raises  Get on your hands and knees on a firm surface. If you are on a hard floor, you may want to use padding, such as an exercise mat, to cushion your knees. Line up your arms and legs. Your hands should be directly below your shoulders, and your knees should be directly below your hips. Lift your left leg behind you. At the same time, raise your right arm and straighten it in front of you. Do not lift your leg higher than your hip. Do not lift your arm higher than your  shoulder. Keep your abdominal and back muscles tight. Keep your hips facing the ground. Do not arch your back. Keep your balance carefully, and do not hold your breath. Hold this position for __________ seconds. Slowly return to the starting position. Repeat with your right  leg and your left arm. Repeat __________ times. Complete this exercise __________ times a day. Abdominal set with straight leg raise  Lie on your back on a firm bed or the floor. Bend one of your knees and keep your other leg straight. Tense your abdominal muscles and lift your straight leg up, 4-6 inches (10-15 cm) off the ground. Keep your abdominal muscles tight and hold this position for __________ seconds. Do not hold your breath. Do not arch your back. Keep it flat against the ground. Keep your abdominal muscles tense as you slowly lower your leg back to the starting position. Repeat with your other leg. Repeat __________ times. Complete this exercise __________ times a day. Single leg lower with bent knees Lie on your back on a firm bed or the floor. Tense your abdominal muscles and lift your feet off the floor, one foot at a time, so your knees and hips are bent in 90-degree angles (right angles). Your knees should be over your hips and your lower legs should be parallel to the floor. Keeping your abdominal muscles tense and your knee bent, slowly lower one of your legs so your toe touches the ground. Lift your leg back up to return to the starting position. Do not hold your breath. Do not let your back arch. Keep your back flat against the ground. Repeat with your other leg. Repeat __________ times. Complete this exercise __________ times a day. Posture and body mechanics Good posture and healthy body mechanics can help to relieve stress in your body's tissues and joints. Body mechanics refers to the movements and positions of your body while you do your daily activities. Posture is part of body mechanics.  Good posture means: Your spine is in its natural S-curve position (neutral). Your shoulders are pulled back slightly. Your head is not tipped forward (neutral). Follow these guidelines to improve your posture and body mechanics in your everyday activities. Standing  When standing, keep your spine neutral and your feet about hip-width apart. Keep a slight bend in your knees. Your ears, shoulders, and hips should line up. When you do a task in which you stand in one place for a long time, place one foot up on a stable object that is 2-4 inches (5-10 cm) high, such as a footstool. This helps keep your spine neutral. Sitting  When sitting, keep your spine neutral and keep your feet flat on the floor. Use a footrest, if necessary, and keep your thighs parallel to the floor. Avoid rounding your shoulders, and avoid tilting your head forward. When working at a desk or a computer, keep your desk at a height where your hands are slightly lower than your elbows. Slide your chair under your desk so you are close enough to maintain good posture. When working at a computer, place your monitor at a height where you are looking straight ahead and you do not have to tilt your head forward or downward to look at the screen. Resting When lying down and resting, avoid positions that are most painful for you. If you have pain with activities such as sitting, bending, stooping, or squatting, lie in a position in which your body does not bend very much. For example, avoid curling up on your side with your arms and knees near your chest (fetal position). If you have pain with activities such as standing for a long time or reaching with your arms, lie with your spine in a neutral position and bend your knees  slightly. Try the following positions: Lying on your side with a pillow between your knees. Lying on your back with a pillow under your knees. Lifting  When lifting objects, keep your feet at least shoulder-width  apart and tighten your abdominal muscles. Bend your knees and hips and keep your spine neutral. It is important to lift using the strength of your legs, not your back. Do not lock your knees straight out. Always ask for help to lift heavy or awkward objects. This information is not intended to replace advice given to you by your health care provider. Make sure you discuss any questions you have with your health care provider. Document Revised: 07/01/2022 Document Reviewed: 05/15/2020 Elsevier Patient Education  2024 [employer].

## 2022-12-28 ENCOUNTER — Ambulatory Visit (HOSPITAL_COMMUNITY)
Admission: RE | Admit: 2022-12-28 | Discharge: 2022-12-28 | Disposition: A | Discharge status: Home / Self Care | Payer: Medicare Other | Source: Ambulatory Visit | Attending: Rheumatology | Admitting: Rheumatology

## 2022-12-28 DIAGNOSIS — M8589 Other specified disorders of bone density and structure, multiple sites: Secondary | ICD-10-CM | POA: Diagnosis not present

## 2022-12-28 DIAGNOSIS — G8929 Other chronic pain: Secondary | ICD-10-CM | POA: Insufficient documentation

## 2022-12-28 DIAGNOSIS — Z8781 Personal history of (healed) traumatic fracture: Secondary | ICD-10-CM | POA: Diagnosis not present

## 2022-12-28 DIAGNOSIS — M4807 Spinal stenosis, lumbosacral region: Secondary | ICD-10-CM | POA: Diagnosis not present

## 2022-12-28 DIAGNOSIS — M459 Ankylosing spondylitis of unspecified sites in spine: Secondary | ICD-10-CM | POA: Insufficient documentation

## 2022-12-28 DIAGNOSIS — M5442 Lumbago with sciatica, left side: Secondary | ICD-10-CM | POA: Insufficient documentation

## 2022-12-28 DIAGNOSIS — M533 Sacrococcygeal disorders, not elsewhere classified: Secondary | ICD-10-CM | POA: Insufficient documentation

## 2022-12-28 DIAGNOSIS — M5126 Other intervertebral disc displacement, lumbar region: Secondary | ICD-10-CM | POA: Diagnosis not present

## 2022-12-28 DIAGNOSIS — M47816 Spondylosis without myelopathy or radiculopathy, lumbar region: Secondary | ICD-10-CM | POA: Insufficient documentation

## 2022-12-28 DIAGNOSIS — M48061 Spinal stenosis, lumbar region without neurogenic claudication: Secondary | ICD-10-CM | POA: Diagnosis not present

## 2022-12-30 DIAGNOSIS — M48062 Spinal stenosis, lumbar region with neurogenic claudication: Secondary | ICD-10-CM | POA: Diagnosis not present

## 2022-12-30 DIAGNOSIS — M4316 Spondylolisthesis, lumbar region: Secondary | ICD-10-CM | POA: Diagnosis not present

## 2023-01-15 DIAGNOSIS — M48062 Spinal stenosis, lumbar region with neurogenic claudication: Secondary | ICD-10-CM | POA: Diagnosis not present

## 2023-01-15 DIAGNOSIS — M6281 Muscle weakness (generalized): Secondary | ICD-10-CM | POA: Diagnosis not present

## 2023-01-20 ENCOUNTER — Ambulatory Visit (INDEPENDENT_AMBULATORY_CARE_PROVIDER_SITE_OTHER): Payer: Medicare Other | Admitting: Student

## 2023-01-20 ENCOUNTER — Encounter: Payer: Self-pay | Admitting: Student

## 2023-01-20 VITALS — BP 156/81 | HR 74 | Wt 252.5 lb

## 2023-01-20 DIAGNOSIS — M7021 Olecranon bursitis, right elbow: Secondary | ICD-10-CM

## 2023-01-20 DIAGNOSIS — I10 Essential (primary) hypertension: Secondary | ICD-10-CM

## 2023-01-20 NOTE — Assessment & Plan Note (Signed)
Blood pressure above goal, patient has not taken blood pressure medications today. -Continue hydrochlorothiazide 12.5 mg and carvedilol 12.5 mg daily - Follow-up in 2 weeks

## 2023-01-20 NOTE — Progress Notes (Signed)
    SUBJECTIVE:   CHIEF COMPLAINT / HPI:   Elbow swelling Elbow swelling, noted a few weeks ago.  Stable swelling, not growing.  Not painful, no erythema, no warmth, no reduced range of motion.  No fevers, chills, NVD, night sweats.  No known traumatic injury.  Hypertension Patient reports that he has not taken his blood pressure medication today.  Checks his blood pressure at home, and most pressures are within goal.  Occasional blood pressures also to goal 1-2 days per week.   OBJECTIVE:   BP (!) 156/81   Pulse 74   Wt 252 lb 8 oz (114.5 kg)   SpO2 100%   BMI 32.42 kg/m    General: NAD, pleasant Cardio: RRR, no MRG. Cap Refill <2s. Respiratory: CTAB, normal wob on RA MSK: Swelling over olecranon bursa, see image below.  Full range of motion of elbow, no TTP, 5/5 strength. Skin: Warm and dry       ASSESSMENT/PLAN:   Assessment & Plan Olecranon bursitis of right elbow Nonpainful, nonsymptomatic olecranon bursitis.  No signs of infection. - Ice, compression, padding, OTC NSAIDs - Follow-up if not improving HTN, goal below 140/90 Blood pressure above goal, patient has not taken blood pressure medications today. -Continue hydrochlorothiazide 12.5 mg and carvedilol 12.5 mg daily - Follow-up in 2 weeks   Tiffany Kocher, DO [facility]

## 2023-01-20 NOTE — Patient Instructions (Signed)
It was great to see you! Thank you for allowing me to participate in your care!   I recommend that you always bring your medications to each appointment as this makes it easy to ensure we are on the correct medications and helps [place] not miss when refills are needed.  Our plans for today:  - Follow-up in 2 weeks for blood pressure recheck - Wear compression sleeve for elbow. Taking ibuprofen as needed. Ice the elbow and use padding when putting elbow on hard surfaces.   Take care and seek immediate care sooner if you develop any concerns. Please remember to show up 15 minutes before your scheduled appointment time!  Tiffany Kocher, DO [facility]

## 2023-01-21 DIAGNOSIS — M48062 Spinal stenosis, lumbar region with neurogenic claudication: Secondary | ICD-10-CM | POA: Diagnosis not present

## 2023-01-22 NOTE — Progress Notes (Signed)
I discussed the MRI results with the patient.  Patient states that he had injection by Dr. Lorrine Kin yesterday which relieved most of his discomfort.  Please forward MRI results to Dr. Lorrine Kin at [facility] neurosurgery.

## 2023-01-27 DIAGNOSIS — M6281 Muscle weakness (generalized): Secondary | ICD-10-CM | POA: Diagnosis not present

## 2023-01-27 DIAGNOSIS — M48062 Spinal stenosis, lumbar region with neurogenic claudication: Secondary | ICD-10-CM | POA: Diagnosis not present

## 2023-01-31 ENCOUNTER — Ambulatory Visit (INDEPENDENT_AMBULATORY_CARE_PROVIDER_SITE_OTHER): Payer: Medicare Other | Admitting: Student

## 2023-01-31 ENCOUNTER — Encounter: Payer: Self-pay | Admitting: Student

## 2023-01-31 VITALS — BP 136/70 | HR 66 | Wt 252.0 lb

## 2023-01-31 DIAGNOSIS — I10 Essential (primary) hypertension: Secondary | ICD-10-CM | POA: Diagnosis not present

## 2023-01-31 NOTE — Progress Notes (Signed)
    SUBJECTIVE:   CHIEF COMPLAINT / HPI:   Hypertension Patient follow-up for elevated blood pressure reading at last visit.  He did not take his blood pressure medication.  He is currently taking metoprolol and hydrochlorothiazide.  He reports compliance with his medications today and for the past week.  OBJECTIVE:   BP 136/70   Pulse 66   Wt 252 lb (114.3 kg)   SpO2 98%   BMI 32.35 kg/m    General: NAD, pleasant Cardio: RRR, no MRG. Respiratory: CTAB, normal wob on RA GI: Abdomen is soft, not tender, not distended. BS present Skin: Warm and dry  ASSESSMENT/PLAN:   Assessment & Plan HTN, goal below 140/90 Blood pressure in goal today.  Compliant with medications, no side effects.  Asymptomatic. - Continue carvedilol 12.5 mg daily - Continue hydrochlorothiazide 12.5 mg daily - Follow-up in 1 year or sooner if necessary  Tiffany Kocher, DO [facility]

## 2023-01-31 NOTE — Patient Instructions (Signed)
It was great to see you! Thank you for allowing me to participate in your care!   I recommend that you always bring your medications to each appointment as this makes it easy to ensure we are on the correct medications and helps [place] not miss when refills are needed.  Our plans for today:  - Please follow-up with me in 1 year or sooner if needed  Take care and seek immediate care sooner if you develop any concerns. Please remember to show up 15 minutes before your scheduled appointment time!  Tiffany Kocher, DO [facility]

## 2023-01-31 NOTE — Assessment & Plan Note (Signed)
Blood pressure in goal today.  Compliant with medications, no side effects.  Asymptomatic. - Continue carvedilol 12.5 mg daily - Continue hydrochlorothiazide 12.5 mg daily - Follow-up in 1 year or sooner if necessary

## 2023-02-03 DIAGNOSIS — M48062 Spinal stenosis, lumbar region with neurogenic claudication: Secondary | ICD-10-CM | POA: Diagnosis not present

## 2023-02-03 DIAGNOSIS — M6281 Muscle weakness (generalized): Secondary | ICD-10-CM | POA: Diagnosis not present

## 2023-02-10 DIAGNOSIS — M48062 Spinal stenosis, lumbar region with neurogenic claudication: Secondary | ICD-10-CM | POA: Diagnosis not present

## 2023-02-10 DIAGNOSIS — M6281 Muscle weakness (generalized): Secondary | ICD-10-CM | POA: Diagnosis not present

## 2023-02-13 DIAGNOSIS — H26493 Other secondary cataract, bilateral: Secondary | ICD-10-CM | POA: Diagnosis not present

## 2023-02-17 DIAGNOSIS — M48062 Spinal stenosis, lumbar region with neurogenic claudication: Secondary | ICD-10-CM | POA: Diagnosis not present

## 2023-02-17 DIAGNOSIS — M6281 Muscle weakness (generalized): Secondary | ICD-10-CM | POA: Diagnosis not present

## 2023-02-24 DIAGNOSIS — M6281 Muscle weakness (generalized): Secondary | ICD-10-CM | POA: Diagnosis not present

## 2023-02-24 DIAGNOSIS — M48062 Spinal stenosis, lumbar region with neurogenic claudication: Secondary | ICD-10-CM | POA: Diagnosis not present

## 2023-04-18 NOTE — Progress Notes (Signed)
Office Visit Note  Patient: Gregory Hansen             Date of Birth: 1950-05-11           MRN: [id number]             PCP: Tiffany Kocher, DO Referring: Evelena Leyden, DO Visit Date: 05/01/2023 Occupation: @GUAROCC @  Subjective:  Joint just presents  History of Present Illness: Gregory Hansen is a 73 y.o. male with ankylosing spondylitis, osteoarthritis and degenerative disc disease.  He returns today after his last visit in October 2024.  He states he continues to have a stiffness in his neck upper back and lower back.  He had lumbar spine injection few months back which helped.  He states the pain is gradually coming back.  Right shoulder joint pain improved.  He continues to have some stiffness in his knees but denies any joint swelling.  There is no history of uveitis, plantar fasciitis or Achilles tendinitis.    Activities of Daily Living:  Patient reports morning stiffness for 0 minutes.   Patient Denies nocturnal pain.  Difficulty dressing/grooming: Denies Difficulty climbing stairs: Denies Difficulty getting out of chair: Denies Difficulty using hands for taps, buttons, cutlery, and/or writing: Denies  Review of Systems  Constitutional:  Negative for fatigue.  HENT:  Positive for mouth dryness. Negative for mouth sores.   Eyes:  Negative for dryness.  Respiratory:  Negative for shortness of breath.   Cardiovascular:  Positive for swelling in legs/feet. Negative for chest pain and palpitations.  Gastrointestinal:  Positive for constipation. Negative for blood in stool and diarrhea.  Endocrine: Negative for increased urination.  Genitourinary:  Negative for involuntary urination.  Musculoskeletal:  Positive for joint pain, gait problem and joint pain. Negative for joint swelling, myalgias, muscle weakness, morning stiffness, muscle tenderness and myalgias.  Skin:  Positive for redness. Negative for color change, rash and sensitivity to sunlight.  Allergic/Immunologic:  Negative for susceptible to infections.  Neurological:  Positive for light-headedness. Negative for dizziness and headaches.  Hematological:  Negative for swollen glands.  Psychiatric/Behavioral:  Negative for depressed mood and sleep disturbance. The patient is not nervous/anxious.     PMFS History:  Patient Active Problem List   Diagnosis Date Noted   Chronic bilateral low back pain without sciatica 07/13/2019   Peripheral neuropathy 11/04/2017   Gynecomastia, male 02/18/2017   Healthcare maintenance 01/20/2017   Aortic atherosclerosis (HCC) 12/03/2016   History of cryptosporidiosis    S/P CABG x 4 10/29/2013   Hyperlipidemia 06/15/2008   HTN, goal below 140/90 06/15/2008   CAD (coronary artery disease) 06/15/2008   SPONDYLITIS, ANKYLOSING 06/15/2008   Atherosclerotic heart disease of native coronary artery without angina pectoris 06/15/2008   Gastro-esophageal reflux disease without esophagitis 06/15/2008    Past Medical History:  Diagnosis Date   Acute hepatitis 01/26/2019   Acute kidney failure (HCC) 03/30/2018   AKI (acute kidney injury) (HCC)    Alcohol use disorder, mild, abuse 10/14/2019   Anginal pain (HCC)    Anxiety    Arthritis    SPINE    Arthritis    Cellulitis 03/30/2018   Coronary artery disease    s/p multiple percutaneous interventions, PCI 202 for DMI and DES distal RCA and CFX-OM 2006   Coronary artery disease    Depression    Depression, major, single episode, mild (HCC) 06/15/2008   Qualifier: Diagnosis of  By: Denyse Amass, CMA, Carol     GERD (gastroesophageal reflux disease)  Hyperlipidemia    mixed   Hyperlipidemia    Hypertension    Major depressive disorder, single episode 06/15/2008   Overview:  Overview:  Qualifier: Diagnosis of  By: Denyse Amass, CMA, Carol    MI (myocardial infarction) (HCC)    Myocardial infarction (HCC)    ? 2006   Numbness and tingling in right hand 07/13/2019   Peripheral neuropathy    calves and both feet   Peripheral neuropathy     calves and both feet   Puncture wound of right foot 03/07/2020   Rhabdomyolysis 02/02/2019   Severe sepsis with acute organ dysfunction (HCC) 12/03/2016   Spondylitis, ankylosing (HCC)     Family History  Problem Relation Age of Onset   Breast cancer Mother 6   Heart disease Father    Cancer Brother    Heart disease Brother    Prostate cancer Brother    Heart attack Brother    Leukemia Maternal Grandmother    Heart disease Maternal Grandfather    Heart disease Other        positive for cardiac disease and both brothers have had cardiac events   Allergic rhinitis Neg Hx    Angioedema Neg Hx    Asthma Neg Hx    Atopy Neg Hx    Immunodeficiency Neg Hx    Eczema Neg Hx    Urticaria Neg Hx    Colon cancer Neg Hx    Colon polyps Neg Hx    Esophageal cancer Neg Hx    Rectal cancer Neg Hx    Stomach cancer Neg Hx    Past Surgical History:  Procedure Laterality Date   ACHILLES TENDON REPAIR     CARDIAC CATHETERIZATION     stent RCA June3, 2002, Stent OM/PTCA circumflex August 13, 2000   CATARACT EXTRACTION Bilateral    CORONARY ANGIOPLASTY WITH STENT PLACEMENT     first one in 2006; had another place 3 months ago (August 2013)   CORONARY ANGIOPLASTY WITH STENT PLACEMENT  03/26/2013   RCA           DR COOPER   CORONARY ANGIOPLASTY WITH STENT PLACEMENT     CORONARY ARTERY BYPASS GRAFT N/A 10/29/2013   Procedure: CORONARY ARTERY BYPASS GRAFTING x 4 (LIMA-LAD, SVG-OM, SVG-PD-PL) ENDOSCOPIC VEIN HARVEST RIGHT THIGH;  Surgeon: Alleen Borne, MD;  Location: [facility] OR;  Service: Open Heart Surgery;  Laterality: N/A;   CORONARY ARTERY BYPASS GRAFT     FINGER SURGERY     5  TH     DIGIT RIGHT HAND   FINGER SURGERY     INTRAOPERATIVE TRANSESOPHAGEAL ECHOCARDIOGRAM N/A 10/29/2013   Procedure: INTRAOPERATIVE TRANSESOPHAGEAL ECHOCARDIOGRAM;  Surgeon: Alleen Borne, MD;  Location: [facility] OR;  Service: Open Heart Surgery;  Laterality: N/A;   KNEE ARTHROSCOPY W/ LASER  2003   LEFT HEART  CATHETERIZATION WITH CORONARY ANGIOGRAM N/A 10/01/2011   Procedure: LEFT HEART CATHETERIZATION WITH CORONARY ANGIOGRAM;  Surgeon: [name], MD;  Location: [facility] CATH LAB;  Service: Cardiovascular;  Laterality: N/A;   LEFT HEART CATHETERIZATION WITH CORONARY ANGIOGRAM N/A 03/26/2013   Procedure: LEFT HEART CATHETERIZATION WITH CORONARY ANGIOGRAM;  Surgeon: Micheline Chapman, MD;  Location: [facility] CATH LAB;  Service: Cardiovascular;  Laterality: N/A;   LEFT HEART CATHETERIZATION WITH CORONARY ANGIOGRAM N/A 10/25/2013   Procedure: LEFT HEART CATHETERIZATION WITH CORONARY ANGIOGRAM;  Surgeon: Micheline Chapman, MD;  Location: [facility] CATH LAB;  Service: Cardiovascular;  Laterality: N/A;   TONSILLECTOMY     Social  History   Social History Narrative   ** Merged History Encounter **       Lives in a one level home with son, and dog, Chief Operating Officer. Works out of the house. 2 steps into house. Smoke alarms in house. No grab bars in bathroom. Has some area rugs and throw rugs with backing.      No pets of his own. Restores antique pens, does like to walk and cycle but having problems with peripheral neuropathy.       Firearm is safely stored.      Wears seat belts in vehicle. Does use sunblock and tries to avoid sun due to vitiligo and previous sunburns.      Eats varied diet. Likes protein, fresh fruits and veggies. Watches fats, saturated fats. Drink a lot of water.    Immunization History  Administered Date(s) Administered   Fluad Quad(high Dose 65+) 12/05/2022   Influenza, High Dose Seasonal PF 11/23/2016   Influenza,inj,Quad PF,6+ Mos 12/18/2017, 11/20/2018   Influenza-Unspecified 12/02/2019, 12/01/2020, 12/10/2021   PFIZER(Purple Top)SARS-COV-2 Vaccination 05/03/2019, 05/24/2019, 12/07/2019, 06/06/2020   Pfizer Covid-19 Vaccine Bivalent Booster 70yrs & up 12/01/2020   Pfizer(Comirnaty)Fall Seasonal Vaccine 12 years and older 12/05/2022   Pneumococcal Conjugate-13 01/20/2017   Pneumococcal  Polysaccharide-23 07/01/2015, 04/01/2018   Tdap 06/30/2015   Unspecified SARS-COV-2 Vaccination 12/10/2021   Zoster Recombinant(Shingrix) 06/28/2019, 09/13/2019     Objective: Vital Signs: BP (!) 154/97 (BP Location: Left Arm, Patient Position: Sitting, Cuff Size: Normal)   Pulse 79   Resp 17   Ht 6\' 2"  (1.88 m)   Wt 265 lb 3.2 oz (120.3 kg)   BMI 34.05 kg/m    Physical Exam Vitals and nursing note reviewed.  Constitutional:      Appearance: He is well-developed.  HENT:     Head: Normocephalic and atraumatic.  Eyes:     Conjunctiva/sclera: Conjunctivae normal.     Pupils: Pupils are equal, round, and reactive to light.  Cardiovascular:     Rate and Rhythm: Normal rate and regular rhythm.     Heart sounds: Normal heart sounds.  Pulmonary:     Effort: Pulmonary effort is normal.     Breath sounds: Normal breath sounds.  Abdominal:     General: Bowel sounds are normal.     Palpations: Abdomen is soft.  Musculoskeletal:     Cervical back: Normal range of motion and neck supple.     Right lower leg: Edema present.     Left lower leg: Edema present.  Skin:    General: Skin is warm and dry.     Capillary Refill: Capillary refill takes less than 2 seconds.  Neurological:     Mental Status: He is alert and oriented to person, place, and time.  Psychiatric:        Behavior: Behavior normal.      Musculoskeletal Exam: He had some limitation with lateral rotation flexion and extension.  Thoracic kyphosis was noted.  He had limited range of motion of thoracic and lumbar spine.  Shoulders, elbows, wrist, MCPs PIPs and DIPs Juengel range of motion without any discomfort.  Hip joints and knee joints with good range of motion.  No warmth swelling or effusion was noted.  There was no tenderness over ankles or MTPs.  Bilateral lower extremity edema was noted.  CDAI Exam: CDAI Score: -- Patient Global: --; Provider Global: -- Swollen: --; Tender: -- Joint Exam 05/01/2023   No joint  exam has been documented for this visit  There is currently no information documented on the homunculus. Go to the Rheumatology activity and complete the homunculus joint exam.  Investigation: No additional findings.  Imaging: No results found.  Recent Labs: Lab Results  Component Value Date   WBC 6.2 09/30/2022   HGB 9.6 (L) 09/30/2022   PLT 195 09/30/2022   NA 135 09/30/2022   K 4.1 09/30/2022   CL 98 09/30/2022   CO2 25 09/30/2022   GLUCOSE 94 09/30/2022   BUN 11 09/30/2022   CREATININE 1.26 09/30/2022   BILITOT 0.3 09/30/2022   ALKPHOS 74 09/30/2022   AST 37 09/30/2022   ALT 23 09/30/2022   PROT 7.2 09/30/2022   ALBUMIN 3.7 (L) 09/30/2022   CALCIUM 9.1 09/30/2022   GFRAA 105 07/24/2020   QFTBGOLDPLUS NEGATIVE 11/10/2019    Speciality Comments: Enbrel started December 09, 2019, Humira 04/22-07/22  Procedures:  No procedures performed Allergies: Patient has no known allergies.   Assessment / Plan:     Visit Diagnoses: Ankylosing spondylitis, unspecified site of spine (HCC) - Ttd with Enbrel in 2021 at [facility] 2022 with no improvement in symptoms.  Patient declined immunosuppressive therapy in the past.  Patient states he noted some improvement on Biologics but it was not worth the risk of immunosuppression.  Advised him to contact me if he had any change in his symptoms.  History of iritis - No recurrence in over 20 years.  Primary osteoarthritis of both knees-he continues to have some stiffness in his knees.  No warmth swelling or effusion was noted.  Chronic SI joint pain-he denies any discomfort today.  Neck pain -he continues to have a stiffness in the cervical region.  He has limited range of motion without much discomfort.  Multilevel spondylosis with C5-C6 narrowing and spondylolisthesis was noted.  Facet joint arthropathy was noted.  DDD (degenerative disc disease), thoracic-thoracic kyphosis without any point tenderness was noted.  Chronic left-sided  low back pain with left-sided sciatica -he continues to have lower back pain.  He had relief from cortisone injection.  He had limited range of motion.  Levoscoliosis was noted.  L4-L5 spondylolisthesis was noted.  Facet joint arthropathy was noted.  Arthropathy of lumbar facet joint  History of vertebral fracture - L1 vertebral fracture November 2020 after an injury.  Osteopenia of multiple sites - May 13, 2022 the BMD measured at [facility] Ne[facility] Left is 0.856 g/cm2 with a T-score of -1.6.  Calcium rich diet and vitamin D was advised.  Other medical problems listed as follows:  Coronary artery disease involving native coronary artery of native heart without angina pectoris  S/P CABG x 4  Essential hypertension  Chronic anemia  Peripheral polyneuropathy  Gastro-esophageal reflux disease without esophagitis  IgA deficiency, selective (HCC)  Vitiligo  History of depression  History of cryptosporidiosis  History of alcohol use disorder  Orders: No orders of the defined types were placed in this encounter.  No orders of the defined types were placed in this encounter.    Follow-Up Instructions: Return in about 1 year (around 04/30/2024) for Ankylosing spondylitis.   Pollyann Savoy, MD  Note - This record has been created using Animal nutritionist.  Chart creation errors have been sought, but may not always  have been located. Such creation errors do not reflect on  the standard of medical care.

## 2023-05-01 ENCOUNTER — Encounter: Payer: Self-pay | Admitting: Rheumatology

## 2023-05-01 ENCOUNTER — Ambulatory Visit: Payer: Medicare Other | Attending: Rheumatology | Admitting: Rheumatology

## 2023-05-01 ENCOUNTER — Encounter: Payer: Self-pay | Admitting: Cardiovascular Disease

## 2023-05-01 VITALS — BP 154/97 | HR 79 | Resp 17 | Ht 74.0 in | Wt 265.2 lb

## 2023-05-01 DIAGNOSIS — D649 Anemia, unspecified: Secondary | ICD-10-CM

## 2023-05-01 DIAGNOSIS — M459 Ankylosing spondylitis of unspecified sites in spine: Secondary | ICD-10-CM | POA: Diagnosis not present

## 2023-05-01 DIAGNOSIS — M5134 Other intervertebral disc degeneration, thoracic region: Secondary | ICD-10-CM | POA: Diagnosis not present

## 2023-05-01 DIAGNOSIS — I2510 Atherosclerotic heart disease of native coronary artery without angina pectoris: Secondary | ICD-10-CM

## 2023-05-01 DIAGNOSIS — Z8659 Personal history of other mental and behavioral disorders: Secondary | ICD-10-CM

## 2023-05-01 DIAGNOSIS — M533 Sacrococcygeal disorders, not elsewhere classified: Secondary | ICD-10-CM | POA: Diagnosis not present

## 2023-05-01 DIAGNOSIS — G8929 Other chronic pain: Secondary | ICD-10-CM

## 2023-05-01 DIAGNOSIS — L80 Vitiligo: Secondary | ICD-10-CM

## 2023-05-01 DIAGNOSIS — Z8669 Personal history of other diseases of the nervous system and sense organs: Secondary | ICD-10-CM | POA: Diagnosis not present

## 2023-05-01 DIAGNOSIS — M542 Cervicalgia: Secondary | ICD-10-CM

## 2023-05-01 DIAGNOSIS — M170 Bilateral primary osteoarthritis of knee: Secondary | ICD-10-CM | POA: Diagnosis not present

## 2023-05-01 DIAGNOSIS — D802 Selective deficiency of immunoglobulin A [IgA]: Secondary | ICD-10-CM

## 2023-05-01 DIAGNOSIS — M8589 Other specified disorders of bone density and structure, multiple sites: Secondary | ICD-10-CM

## 2023-05-01 DIAGNOSIS — M5442 Lumbago with sciatica, left side: Secondary | ICD-10-CM

## 2023-05-01 DIAGNOSIS — M47816 Spondylosis without myelopathy or radiculopathy, lumbar region: Secondary | ICD-10-CM | POA: Diagnosis not present

## 2023-05-01 DIAGNOSIS — Z8781 Personal history of (healed) traumatic fracture: Secondary | ICD-10-CM

## 2023-05-01 DIAGNOSIS — Z951 Presence of aortocoronary bypass graft: Secondary | ICD-10-CM

## 2023-05-01 DIAGNOSIS — Z8619 Personal history of other infectious and parasitic diseases: Secondary | ICD-10-CM

## 2023-05-01 DIAGNOSIS — I10 Essential (primary) hypertension: Secondary | ICD-10-CM

## 2023-05-01 DIAGNOSIS — M25511 Pain in right shoulder: Secondary | ICD-10-CM

## 2023-05-01 DIAGNOSIS — Z87898 Personal history of other specified conditions: Secondary | ICD-10-CM

## 2023-05-01 DIAGNOSIS — G629 Polyneuropathy, unspecified: Secondary | ICD-10-CM

## 2023-05-01 DIAGNOSIS — K219 Gastro-esophageal reflux disease without esophagitis: Secondary | ICD-10-CM

## 2023-06-12 DIAGNOSIS — M48062 Spinal stenosis, lumbar region with neurogenic claudication: Secondary | ICD-10-CM | POA: Diagnosis not present

## 2023-06-20 ENCOUNTER — Other Ambulatory Visit: Payer: Self-pay | Admitting: Student

## 2023-08-06 DIAGNOSIS — M48062 Spinal stenosis, lumbar region with neurogenic claudication: Secondary | ICD-10-CM | POA: Diagnosis not present

## 2023-08-06 DIAGNOSIS — M4316 Spondylolisthesis, lumbar region: Secondary | ICD-10-CM | POA: Diagnosis not present

## 2023-08-07 ENCOUNTER — Ambulatory Visit: Admitting: Student

## 2023-08-07 ENCOUNTER — Encounter: Payer: Self-pay | Admitting: Student

## 2023-08-07 ENCOUNTER — Ambulatory Visit (HOSPITAL_COMMUNITY)
Admission: RE | Admit: 2023-08-07 | Discharge: 2023-08-07 | Disposition: A | Discharge status: Home / Self Care | Source: Ambulatory Visit | Attending: Family Medicine | Admitting: Family Medicine

## 2023-08-07 VITALS — BP 102/77 | HR 82 | Ht 74.0 in | Wt 254.1 lb

## 2023-08-07 DIAGNOSIS — R42 Dizziness and giddiness: Secondary | ICD-10-CM

## 2023-08-07 DIAGNOSIS — I10 Essential (primary) hypertension: Secondary | ICD-10-CM | POA: Diagnosis not present

## 2023-08-07 DIAGNOSIS — R0609 Other forms of dyspnea: Secondary | ICD-10-CM

## 2023-08-07 DIAGNOSIS — D649 Anemia, unspecified: Secondary | ICD-10-CM

## 2023-08-07 LAB — POCT HEMOGLOBIN: Hemoglobin: 9.5 g/dL — AB (ref 11–14.6)

## 2023-08-07 MED ORDER — CARVEDILOL 6.25 MG PO TABS: 6.2500 mg | ORAL_TABLET | Freq: Two times a day (BID) | ORAL | 1 refills | Status: DC

## 2023-08-07 NOTE — Assessment & Plan Note (Signed)
 Decrease medication as above.

## 2023-08-07 NOTE — Progress Notes (Signed)
    SUBJECTIVE:   CHIEF COMPLAINT / HPI:    Fall, dizziness Patient reports he fell 2 weeks ago, did not hit his head.  Did not seek medical care.  He has had no difficulty with ambulating, has been himself.  No stroke symptoms.  He has been having ongoing dizziness, episodes are random.  Not related to position.  Dizzy episodes last for less than 30 seconds at a time.  Sometimes improves with sitting.  He has noted some lower blood pressures at home. No fevers, chills, changes in vision, changes in hearing, NVD, chest pain.  Exertional Dyspnea Also endorses worsening exertional dyspnea with activity.  He has significant heart history including a CABG x 4.  He feels that this is related to his back pain, he is currently being followed by neurosurgery for lumbar stenosis-status post injections.  He states that if his back are truly bad, it is hard to catch his breath-however he does admit that dyspnea can occur outside of his back pain.  PERTINENT  PMH / PSH: HTN, CAD, CABG x 4, ankylosing spondylitis  OBJECTIVE:   BP 102/77   Pulse 82   Ht 6\' 2"  (1.88 m)   Wt 254 lb 2 oz (115.3 kg)   SpO2 99%   BMI 32.63 kg/m    General: NAD, pleasant HEENT: Normocephalic, atraumatic head. Cardio: RRR, no MRG. Cap Refill <2s. Respiratory: CTAB, normal wob on RA Skin: Warm and dry Neuro: CN II: PERRL CN III, IV,VI: EOMI CV V: Normal sensation in V1, V2, V3 CVII: Symmetric smile and brow raise CN VIII: Normal hearing CN IX,X: Symmetric palate raise  CN XI: 5/5 shoulder shrug CN XII: Symmetric tongue protrusion  UE and LE strength 5/5 Normal sensation in UE and LE bilaterally Dix-Hallpike negative  ASSESSMENT/PLAN:   Assessment & Plan Dizziness Associated with fall 2 weeks ago.  Did not hit his head.  Normal neuroexam today.  Positive orthostatics today.  Dix-Hallpike negative. EKG reviewed today, normal sinus-similar to prior.  Additional differential includes: Arrhythmia,  cardiomyopathy, anemia, endocrinopathy, dehydration. - TSH, CBC, CMET - Discontinue hydrochlorothiazide , decrease carvedilol  to 6.25 mg twice daily - Echocardiogram - Consider Zio patch -ED precautions discussed Exertional dyspnea Differential as above including CAD. - He will follow-up with the cardiologist, may need coronary CT given worsening exertional dyspnea versus cath with his significant cardiac history Anemia, unspecified type Hemoglobin 9.5 in office  Colonoscopy 3 years ago with polyps removed.  Unclear cause of anemia. - Iron studies ordered HTN, goal below 140/90 Decrease medication as above.  Lavada Porteous, DO [facility]

## 2023-08-07 NOTE — Patient Instructions (Addendum)
 It was great to see you! Thank you for allowing me to participate in your care!   I recommend that you always bring your medications to each appointment as this makes it easy to ensure we are on the correct medications and helps us  not miss when refills are needed.  Our plans for today:  - Please stop hydrochlorothiazide  - Please take a reduced dose of carvedilol .  I have sent in a new prescription for 6.25 mg twice daily.  If the pills are big enough to break in half, you could break your pills in half instead. - I have ordered an echocardiogram, they will call you to schedule -Please call your cardiologist to schedule appointment as soon as possible to discuss shortness of breath while walking and dizziness - We are checking some labs today, I will call you if they are abnormal will send you a MyChart message or a letter if they are normal.  If you do not hear about your labs in the next 2 weeks please let us  know.  Take care and seek immediate care sooner if you develop any concerns. Please remember to show up 15 minutes before your scheduled appointment time!  Lavada Porteous, DO [facility]

## 2023-08-08 ENCOUNTER — Other Ambulatory Visit: Payer: Self-pay | Admitting: Pain Medicine

## 2023-08-08 ENCOUNTER — Ambulatory Visit: Payer: Self-pay | Admitting: Student

## 2023-08-08 DIAGNOSIS — M4316 Spondylolisthesis, lumbar region: Secondary | ICD-10-CM

## 2023-08-08 LAB — CBC WITH DIFFERENTIAL/PLATELET
Basophils Absolute: 0.1 10*3/uL (ref 0.0–0.2)
Basos: 1 %
EOS (ABSOLUTE): 0.4 10*3/uL (ref 0.0–0.4)
Eos: 6 %
Hematocrit: 33.9 % — ABNORMAL LOW (ref 37.5–51.0)
Hemoglobin: 9.9 g/dL — ABNORMAL LOW (ref 13.0–17.7)
Immature Grans (Abs): 0 10*3/uL (ref 0.0–0.1)
Immature Granulocytes: 0 %
Lymphocytes Absolute: 1.2 10*3/uL (ref 0.7–3.1)
Lymphs: 18 %
MCH: 23.2 pg — ABNORMAL LOW (ref 26.6–33.0)
MCHC: 29.2 g/dL — ABNORMAL LOW (ref 31.5–35.7)
MCV: 79 fL (ref 79–97)
Monocytes Absolute: 0.8 10*3/uL (ref 0.1–0.9)
Monocytes: 12 %
Neutrophils Absolute: 4.4 10*3/uL (ref 1.4–7.0)
Neutrophils: 63 %
Platelets: 237 10*3/uL (ref 150–450)
RBC: 4.27 x10E6/uL (ref 4.14–5.80)
RDW: 19.1 % — ABNORMAL HIGH (ref 11.6–15.4)
WBC: 7 10*3/uL (ref 3.4–10.8)

## 2023-08-08 LAB — COMPREHENSIVE METABOLIC PANEL WITH GFR
ALT: 17 IU/L (ref 0–44)
AST: 24 IU/L (ref 0–40)
Albumin: 4 g/dL (ref 3.8–4.8)
Alkaline Phosphatase: 82 IU/L (ref 44–121)
BUN/Creatinine Ratio: 12 (ref 10–24)
BUN: 23 mg/dL (ref 8–27)
Bilirubin Total: 0.5 mg/dL (ref 0.0–1.2)
CO2: 24 mmol/L (ref 20–29)
Calcium: 9.4 mg/dL (ref 8.6–10.2)
Chloride: 89 mmol/L — ABNORMAL LOW (ref 96–106)
Creatinine, Ser: 1.96 mg/dL — ABNORMAL HIGH (ref 0.76–1.27)
Globulin, Total: 3.2 g/dL (ref 1.5–4.5)
Glucose: 121 mg/dL — ABNORMAL HIGH (ref 70–99)
Potassium: 3.6 mmol/L (ref 3.5–5.2)
Sodium: 129 mmol/L — ABNORMAL LOW (ref 134–144)
Total Protein: 7.2 g/dL (ref 6.0–8.5)
eGFR: 35 mL/min/{1.73_m2} — ABNORMAL LOW (ref >59)

## 2023-08-08 LAB — IRON,TIBC AND FERRITIN PANEL
Ferritin: 22 ng/mL — ABNORMAL LOW (ref 30–400)
Iron Saturation: 7 % — CL (ref 15–55)
Iron: 27 ug/dL — ABNORMAL LOW (ref 38–169)
Total Iron Binding Capacity: 389 ug/dL (ref 250–450)
UIBC: 362 ug/dL — ABNORMAL HIGH (ref 111–343)

## 2023-08-08 LAB — TSH RFX ON ABNORMAL TO FREE T4: TSH: 3.25 u[IU]/mL (ref 0.450–4.500)

## 2023-08-08 NOTE — Telephone Encounter (Signed)
 Called patient x2, no answer. Left HIPAA compliant VM with ED precautions.  If patient calls back, Dr. Telford Feather will be out of the office this afternoon.  You can inform him of the following:  -His hemoglobin is stable, however he has significant iron deficiency anemia.  He will need to go back to see GI for EGD and colonoscopy.  The treatment will be iron supplementation, but before we start supplementation we need to see him back on Monday for the other concerns that we have found on labs. -His creatinine level, which is a marker of kidney function is significantly elevated.  This can be a sign of an acute kidney injury.  We recommend he drinks plenty of water, and has repeat lab work on Monday.  If his creatinine is still elevated, we will likely need to get ultrasound of his kidneys and send him to nephrology.  Additionally we will need to check his urine on Monday. -His sodium level is low, this could be related to his hydrochlorothiazide .  He is no longer taking hydrochlorothiazide , but we will recheck this on Monday.   I will route this message to our staff to call patient to get his appointment scheduled for Monday.  He needs a doctor's visit, please schedule and access to care.  If there is no availability for access to care or other providers on Monday, please contact Dr. Bevin Bucks who was precepting this afternoon to try to find a slot for him.   For the provider seeing him on Monday: Patient was seen for dizziness, initiated workup.  Found to have AKI and severe iron deficiency anemia. Please obtain BMP, UA for newly found kidney impairment Consider bilateral renal ultrasound and/or nephrology referral Needs referral to GI for EGD and colonoscopy Consider starting iron supplementation

## 2023-08-08 NOTE — Progress Notes (Signed)
 Attempted to reach patient. To make appt in ATC for Mon. Jun 2nd . No answer. LVM for patient to call the office to make this appt. Linnie Riches, CMA.

## 2023-08-11 ENCOUNTER — Ambulatory Visit (INDEPENDENT_AMBULATORY_CARE_PROVIDER_SITE_OTHER): Admitting: Student

## 2023-08-11 VITALS — BP 125/70 | HR 72 | Temp 97.6°F | Ht 74.0 in | Wt 254.0 lb

## 2023-08-11 DIAGNOSIS — D649 Anemia, unspecified: Secondary | ICD-10-CM | POA: Diagnosis not present

## 2023-08-11 DIAGNOSIS — N179 Acute kidney failure, unspecified: Secondary | ICD-10-CM

## 2023-08-11 MED ORDER — FERROUS GLUCONATE 324 (38 FE) MG PO TABS: 324.0000 mg | ORAL_TABLET | ORAL | 0 refills | Status: DC

## 2023-08-11 NOTE — Progress Notes (Signed)
  SUBJECTIVE:   CHIEF COMPLAINT / HPI:   Presents today for follow-up regarding his dizziness and last seen by Dr. Telford Feather on 5/29.  He had concerning lab abnormalities in particular AKI with hyponatremia and anemia with iron deficiency.  He denies any worsening of his dizziness.  Denies blood in urine or stool and had an otherwise normal weekend.  Recommendations from PCP Please obtain BMP, UA for newly found kidney impairment Consider bilateral renal ultrasound and/or nephrology referral Needs referral to GI for EGD and colonoscopy Consider starting iron supplementation  PERTINENT  PMH / PSH: HTN, CAD s/p CABG, HLD, GERD  OBJECTIVE:  BP 125/70   Pulse 72   Temp 97.6 F (36.4 C)   Ht 6\' 2"  (1.88 m)   Wt 254 lb (115.2 kg)   SpO2 99%   BMI 32.61 kg/m  General: Well-appearing, NAD CV: RRR, no murmurs appreciable  ASSESSMENT/PLAN:   Assessment & Plan AKI (acute kidney injury) (HCC) Reviewed lab work with patient.  Recheck BMP and UA, he has been drinking water regularly.  AKI may be related to dehydration and combination with HCTZ use.  If persisting, consider nephrology referral and bilateral renal ultrasound. Anemia, unspecified type Reviewed lab work with patient, sent ferrous gluconate to take every other day and recommend taking with something with vitamin C such as orange juice.  Placed referral to GI for EGD and colonoscopy.  Colonoscopy approximately 4 years ago was remarkable for 4 polyps although not advised to have follow-up for another 10 years.  He has never had EGD.  He will need follow-up regarding his iron levels and likely 2 months to measure response to oral iron supplementation.  Additionally, previous PSA significantly elevated at 9.34.  Will recheck for consideration of prostate cancer.  Veronia Goon, DO 08/11/2023, 10:36 AM PGY-3, [facility]

## 2023-08-11 NOTE — Patient Instructions (Addendum)
 It was great to see you today! Thank you for choosing [facility] for your primary care.  Today we addressed: Repeat lab work today and urine.  I referred you to GI.  Should you continue to have dysfunctional kidney numbers, I will speak with you further regarding kidney ultrasound and a nephrology referral.  If you haven't already, sign up for My Chart to have easy access to your labs results, and communication with your primary care physician. We are checking some labs today. If they are abnormal, I will call you. If they are normal, I will send you a MyChart message (if it is active) or a letter in the mail. If you do not hear about your labs in the next 2 weeks, please call the office. Return if symptoms worsen or fail to improve. Please arrive 15 minutes before your appointment to ensure smooth check in process.  We appreciate your efforts in making this happen.  Thank you for allowing me to participate in your care, Veronia Goon, DO 08/11/2023, 10:37 AM PGY-3, [facility]

## 2023-08-12 ENCOUNTER — Ambulatory Visit: Payer: Self-pay | Admitting: Student

## 2023-08-12 DIAGNOSIS — R809 Proteinuria, unspecified: Secondary | ICD-10-CM

## 2023-08-12 DIAGNOSIS — N179 Acute kidney failure, unspecified: Secondary | ICD-10-CM

## 2023-08-12 LAB — URINALYSIS
Bilirubin, UA: NEGATIVE
Glucose, UA: NEGATIVE
Nitrite, UA: NEGATIVE
Specific Gravity, UA: 1.022 (ref 1.005–1.030)
Urobilinogen, Ur: 1 mg/dL (ref 0.2–1.0)
pH, UA: 6.5 (ref 5.0–7.5)

## 2023-08-12 LAB — BASIC METABOLIC PANEL WITH GFR
BUN/Creatinine Ratio: 11 (ref 10–24)
BUN: 18 mg/dL (ref 8–27)
CO2: 22 mmol/L (ref 20–29)
Calcium: 9.9 mg/dL (ref 8.6–10.2)
Chloride: 95 mmol/L — ABNORMAL LOW (ref 96–106)
Creatinine, Ser: 1.67 mg/dL — ABNORMAL HIGH (ref 0.76–1.27)
Glucose: 129 mg/dL — ABNORMAL HIGH (ref 70–99)
Potassium: 3.7 mmol/L (ref 3.5–5.2)
Sodium: 133 mmol/L — ABNORMAL LOW (ref 134–144)
eGFR: 43 mL/min/{1.73_m2} — ABNORMAL LOW (ref >59)

## 2023-08-12 LAB — PSA: Prostate Specific Ag, Serum: 1.7 ng/mL (ref 0.0–4.0)

## 2023-08-18 ENCOUNTER — Ambulatory Visit
Admission: RE | Admit: 2023-08-18 | Discharge: 2023-08-18 | Disposition: A | Discharge status: Home / Self Care | Source: Ambulatory Visit | Attending: Family Medicine

## 2023-08-18 DIAGNOSIS — R809 Proteinuria, unspecified: Secondary | ICD-10-CM | POA: Diagnosis not present

## 2023-08-18 DIAGNOSIS — N179 Acute kidney failure, unspecified: Secondary | ICD-10-CM | POA: Diagnosis not present

## 2023-08-21 ENCOUNTER — Ambulatory Visit
Admission: RE | Admit: 2023-08-21 | Discharge: 2023-08-21 | Disposition: A | Discharge status: Home / Self Care | Source: Ambulatory Visit | Attending: Pain Medicine | Admitting: Pain Medicine

## 2023-08-21 DIAGNOSIS — M48061 Spinal stenosis, lumbar region without neurogenic claudication: Secondary | ICD-10-CM | POA: Diagnosis not present

## 2023-08-21 DIAGNOSIS — M5126 Other intervertebral disc displacement, lumbar region: Secondary | ICD-10-CM | POA: Diagnosis not present

## 2023-08-21 DIAGNOSIS — M4316 Spondylolisthesis, lumbar region: Secondary | ICD-10-CM

## 2023-08-25 ENCOUNTER — Ambulatory Visit: Payer: Self-pay | Admitting: Student

## 2023-08-26 ENCOUNTER — Ambulatory Visit (HOSPITAL_COMMUNITY)
Admission: RE | Admit: 2023-08-26 | Discharge: 2023-08-26 | Disposition: A | Discharge status: Home / Self Care | Source: Ambulatory Visit | Attending: Family Medicine | Admitting: Family Medicine

## 2023-08-26 ENCOUNTER — Encounter: Payer: Self-pay | Admitting: Student

## 2023-08-26 DIAGNOSIS — I10 Essential (primary) hypertension: Secondary | ICD-10-CM

## 2023-08-26 DIAGNOSIS — I3481 Nonrheumatic mitral (valve) annulus calcification: Secondary | ICD-10-CM | POA: Insufficient documentation

## 2023-08-26 DIAGNOSIS — I517 Cardiomegaly: Secondary | ICD-10-CM | POA: Diagnosis not present

## 2023-08-26 DIAGNOSIS — R42 Dizziness and giddiness: Secondary | ICD-10-CM | POA: Diagnosis not present

## 2023-08-26 DIAGNOSIS — R0609 Other forms of dyspnea: Secondary | ICD-10-CM | POA: Insufficient documentation

## 2023-08-26 LAB — ECHOCARDIOGRAM COMPLETE
AR max vel: 3.67 cm2
AV Area VTI: 3.53 cm2
AV Area mean vel: 2.77 cm2
AV Mean grad: 3 mmHg
AV Peak grad: 5.7 mmHg
Ao pk vel: 1.19 m/s
Area-P 1/2: 2.7 cm2
Calc EF: 67.2 %
S' Lateral: 3 cm
Single Plane A2C EF: 68.6 %
Single Plane A4C EF: 63.5 %

## 2023-08-26 MED ORDER — CARVEDILOL 6.25 MG PO TABS: 6.2500 mg | ORAL_TABLET | Freq: Two times a day (BID) | ORAL | 1 refills | Status: DC

## 2023-08-26 NOTE — Progress Notes (Signed)
*  PRELIMINARY RESULTS* Echocardiogram 2D Echocardiogram has been performed.  Garrette Caine D Asberry Lascola 08/26/2023, 10:50 AM

## 2023-09-11 ENCOUNTER — Encounter: Payer: Self-pay | Admitting: Physician Assistant

## 2023-09-24 ENCOUNTER — Encounter: Payer: Self-pay | Admitting: Student

## 2023-09-24 ENCOUNTER — Other Ambulatory Visit: Payer: Self-pay | Admitting: Student

## 2023-09-25 ENCOUNTER — Other Ambulatory Visit

## 2023-09-25 ENCOUNTER — Encounter: Payer: Self-pay | Admitting: Physician Assistant

## 2023-09-25 ENCOUNTER — Ambulatory Visit: Admitting: Physician Assistant

## 2023-09-25 VITALS — BP 110/60 | HR 76 | Ht 69.5 in | Wt 254.0 lb

## 2023-09-25 DIAGNOSIS — I2510 Atherosclerotic heart disease of native coronary artery without angina pectoris: Secondary | ICD-10-CM

## 2023-09-25 DIAGNOSIS — K219 Gastro-esophageal reflux disease without esophagitis: Secondary | ICD-10-CM

## 2023-09-25 DIAGNOSIS — D509 Iron deficiency anemia, unspecified: Secondary | ICD-10-CM

## 2023-09-25 DIAGNOSIS — K760 Fatty (change of) liver, not elsewhere classified: Secondary | ICD-10-CM

## 2023-09-25 DIAGNOSIS — M459 Ankylosing spondylitis of unspecified sites in spine: Secondary | ICD-10-CM

## 2023-09-25 DIAGNOSIS — Z860101 Personal history of adenomatous and serrated colon polyps: Secondary | ICD-10-CM | POA: Diagnosis not present

## 2023-09-25 DIAGNOSIS — Z87891 Personal history of nicotine dependence: Secondary | ICD-10-CM

## 2023-09-25 LAB — HEPATIC FUNCTION PANEL
ALT: 12 U/L (ref 0–53)
AST: 15 U/L (ref 0–37)
Albumin: 3.9 g/dL (ref 3.5–5.2)
Alkaline Phosphatase: 80 U/L (ref 39–117)
Bilirubin, Direct: 0.2 mg/dL (ref 0.0–0.3)
Total Bilirubin: 0.6 mg/dL (ref 0.2–1.2)
Total Protein: 7 g/dL (ref 6.0–8.3)

## 2023-09-25 LAB — CBC WITH DIFFERENTIAL/PLATELET
Basophils Absolute: 0.1 K/uL (ref 0.0–0.1)
Basophils Relative: 1 % (ref 0.0–3.0)
Eosinophils Absolute: 0.6 K/uL (ref 0.0–0.7)
Eosinophils Relative: 12.1 % — ABNORMAL HIGH (ref 0.0–5.0)
HCT: 33 % — ABNORMAL LOW (ref 39.0–52.0)
Hemoglobin: 10.2 g/dL — ABNORMAL LOW (ref 13.0–17.0)
Lymphocytes Relative: 19.4 % (ref 12.0–46.0)
Lymphs Abs: 1 K/uL (ref 0.7–4.0)
MCHC: 30.8 g/dL (ref 30.0–36.0)
MCV: 76.9 fl — ABNORMAL LOW (ref 78.0–100.0)
Monocytes Absolute: 0.7 K/uL (ref 0.1–1.0)
Monocytes Relative: 13.2 % — ABNORMAL HIGH (ref 3.0–12.0)
Neutro Abs: 2.7 K/uL (ref 1.4–7.7)
Neutrophils Relative %: 54.3 % (ref 43.0–77.0)
Platelets: 164 K/uL (ref 150.0–400.0)
RBC: 4.29 Mil/uL (ref 4.22–5.81)
RDW: 20.4 % — ABNORMAL HIGH (ref 11.5–15.5)
WBC: 5.1 K/uL (ref 4.0–10.5)

## 2023-09-25 LAB — BASIC METABOLIC PANEL WITH GFR
BUN: 14 mg/dL (ref 6–23)
CO2: 25 meq/L (ref 19–32)
Calcium: 9.5 mg/dL (ref 8.4–10.5)
Chloride: 104 meq/L (ref 96–112)
Creatinine, Ser: 1.08 mg/dL (ref 0.40–1.50)
GFR: 67.85 mL/min (ref >60.00)
Glucose, Bld: 91 mg/dL (ref 70–99)
Potassium: 4.5 meq/L (ref 3.5–5.1)
Sodium: 135 meq/L (ref 135–145)

## 2023-09-25 LAB — IBC + FERRITIN
Ferritin: 15.7 ng/mL — ABNORMAL LOW (ref 22.0–322.0)
Iron: 118 ug/dL (ref 42–165)
Saturation Ratios: 27.3 % (ref 20.0–50.0)
TIBC: 432.6 ug/dL (ref 250.0–450.0)
Transferrin: 309 mg/dL (ref 212.0–360.0)

## 2023-09-25 LAB — PROTIME-INR
INR: 1.2 ratio — ABNORMAL HIGH (ref 0.8–1.0)
Prothrombin Time: 12.7 s (ref 9.6–13.1)

## 2023-09-25 MED ORDER — NA SULFATE-K SULFATE-MG SULF 17.5-3.13-1.6 GM/177ML PO SOLN: 1.0000 | Freq: Once | ORAL | 0 refills | Status: AC

## 2023-09-25 NOTE — Progress Notes (Signed)
 09/25/2023 Gregory Hansen [id number] 1950-08-03  Referring provider: Howell Lunger, DO Primary GI doctor: Dr. San (Dr. Teressa)  ASSESSMENT AND PLAN:  IDA 08/07/2023  HGB 9.5 MCV 79 Platelets 237 08/07/2023 Iron 27 Ferritin 22  Recent Labs    09/30/22 1015 08/07/23 1455 08/07/23 1600  HGB 9.6* 9.9* 9.5*  Has been on iron 325 mg every other day for last 30 days, some constipation with this but uses 1/2 capful of miralax  a day Minor BRB on TP rare, after BM, no melena The patient is hemodynamically stable.  He does have significant iron deficiency anemia.   He will require diagnostic work-up including an upper and lower endoscopy.   will check CBC, iron/ferritin. ( Patient had fall 2 weeks ago and does have hematoma left side) Depending on the iron indices and if the patient can not tolerate oral iron, may need to consider iron transfusion outpatient  ER precautions discussed with the patient: severe weakness/dizziness, severe AB pain, vomit blood, shortness of breath or chest pain.  Risk of bowel prep, conscious sedation, and EGD and colonoscopy were discussed. Risks include but are not limited to dehydration, pain, bleeding, cardiopulmonary process, bowel perforation, or other possible adverse outcomes.. Treatment plan was discussed with patient, and agreed upon.  GERD Has been on pantoprazole  40 mg once daily for years Well controlled No melena, N,V, dysphagia  Personal history of tubular adenomatous polyps No family history of colon cancer 02/07/2020 colonoscopy for FOBT positive stool four TA polyps 2 to 5 mm descending transverse, diverticulosis left colon, internal hemorrhoids Recall 3 years  CAD status post bypass 2015 and multiple PCI surgeries 08/26/2023 echo EF 60 to 65%, grade 1 diastolic, no AS Follows with Dr. Wonda last seen 09/2022 No chest pain, no SOB  Ankylosing spondylitis Not on any medications, has walking stick He is not on any medications  at this time  Hepatic steatosis Seen on liver Doppler 01/2019, mild splenomegaly patient had acute transaminitis secondary to rhabdo/fall negative hepatitis panel    Latest Ref Rng & Units 08/07/2023    2:55 PM 09/30/2022   10:15 AM 10/16/2021    5:25 PM  Hepatic Function  Total Protein 6.0 - 8.5 g/dL 7.2  7.2  7.4   Albumin  3.8 - 4.8 g/dL 4.0  3.7  3.8   AST 0 - 40 IU/L 24  37  31   ALT 0 - 44 IU/L 17  23  24    Alk Phosphatase 44 - 121 IU/L 82  74  76   Total Bilirubin 0.0 - 1.2 mg/dL 0.5  0.3  1.0    Platelets 237  FIB 4 = 1.79  - need LFTs and CBC monitored every 6 months, - repeat AB US , check INR - consider ELF/fibrosure/elastography -Continue to work on risk factor modification including diet exercise and control of risk factors including blood sugars.  Patient Care Team: Howell Lunger, DO as PCP - General (Family Medicine) Wonda Sharper, MD as PCP - Cardiology (Cardiology) Elihue Mitzie POUR, MD as Referring Physician (Ophthalmology) Windle Fret, NP (Inactive) as Nurse Practitioner (Nurse Practitioner) Cary Doffing, MD as Consulting Physician (Dermatology) Gaspar Kung, MD as Referring Physician (Orthopedic Surgery) Regenia Prentice Clack, MD as Consulting Physician (Ophthalmology) Dolphus Reiter, MD as Consulting Physician (Rheumatology)  HISTORY OF PRESENT ILLNESS: 73 y.o. male with a past medical history listed below presents for evaluation of IDA.   Last seen in the office in 2021 by Dr. Teressa for a colonoscopy.  Discussed the use of AI scribe software for clinical note transcription with the patient, who gave verbal consent to proceed.  History of Present Illness   Gregory Hansen is a 73 year old male who presents for evaluation of iron deficiency anemia.  He has iron deficiency anemia identified through a routine blood test. He has no prior history of anemia and denies significant blood in his stool, noting only occasional light blood attributed to  external hemorrhoids. No dark red or black stools. He has been on iron supplementation every other day since May 2025, experiencing mild constipation as a side effect, for which he uses Miralax  with significant relief. No weight loss or night sweats.  He has a history of gastroesophageal reflux disease (GERD) and has been on Protonix  once daily since his twenties. His symptoms are well-controlled with the medication, although he avoids certain spicy foods. No nausea, vomiting, or difficulty swallowing.  He has a history of ankylosing spondylitis and a back injury from a fall in his garage, resulting in an offset between vertebrae L5 and L6. He uses a walking stick to help maintain an upright posture and reports that without it, he tends to hunch over. He has previously received spinal injections, which provided temporary relief, but he is not currently on any oral medications for this condition.  He reports a recent fall two weeks ago, resulting in a left hematoma. No abdominal pain associated with this incident.  He has a history of fatty liver and mild splenomegaly noted on a liver Doppler in 2020, but his liver function tests have been normal since then. He also mentions a referral to nephrology due to protein in his urine, although a renal ultrasound was negative.      He  reports that he quit smoking about 44 years ago. His smoking use included cigarettes. He started smoking about 54 years ago. He has a 20 pack-year smoking history. He has been exposed to tobacco smoke. He has never used smokeless tobacco. He reports current alcohol use of about 14.0 standard drinks of alcohol per week. He reports that he does not use drugs.  RELEVANT GI HISTORY, IMAGING AND LABS: Results   LABS Ferritin: Low  RADIOLOGY Liver Doppler: Fatty liver (2020) Spleen: Mild splenomegaly (2020) Renal Ultrasound: Negative  DIAGNOSTIC Colonoscopy: Polyps, internal hemorrhoids, diverticulosis  (02/07/2020) Echocardiogram: Normal (08/25/2021)      CBC    Component Value Date/Time   WBC 7.0 08/07/2023 1455   WBC 5.0 10/31/2020 1333   RBC 4.27 08/07/2023 1455   RBC 4.18 (L) 10/31/2020 1333   HGB 9.5 (A) 08/07/2023 1600   HGB 9.9 (L) 08/07/2023 1455   HCT 33.9 (L) 08/07/2023 1455   PLT 237 08/07/2023 1455   MCV 79 08/07/2023 1455   MCH 23.2 (L) 08/07/2023 1455   MCH 24.4 (L) 10/31/2020 1333   MCHC 29.2 (L) 08/07/2023 1455   MCHC 30.3 (L) 10/31/2020 1333   RDW 19.1 (H) 08/07/2023 1455   LYMPHSABS 1.2 08/07/2023 1455   MONOABS 0.8 02/08/2019 0502   EOSABS 0.4 08/07/2023 1455   BASOSABS 0.1 08/07/2023 1455   Recent Labs    09/30/22 1015 08/07/23 1455 08/07/23 1600  HGB 9.6* 9.9* 9.5*    CMP     Component Value Date/Time   NA 133 (L) 08/11/2023 1047   K 3.7 08/11/2023 1047   CL 95 (L) 08/11/2023 1047   CO2 22 08/11/2023 1047   GLUCOSE 129 (H) 08/11/2023 1047   GLUCOSE 99  10/31/2020 1333   BUN 18 08/11/2023 1047   CREATININE 1.67 (H) 08/11/2023 1047   CREATININE 1.04 10/31/2020 1333   CALCIUM  9.9 08/11/2023 1047   PROT 7.2 08/07/2023 1455   ALBUMIN  4.0 08/07/2023 1455   AST 24 08/07/2023 1455   ALT 17 08/07/2023 1455   ALKPHOS 82 08/07/2023 1455   BILITOT 0.5 08/07/2023 1455   GFRNONAA 91 07/24/2020 1450   GFRAA 105 07/24/2020 1450      Latest Ref Rng & Units 08/07/2023    2:55 PM 09/30/2022   10:15 AM 10/16/2021    5:25 PM  Hepatic Function  Total Protein 6.0 - 8.5 g/dL 7.2  7.2  7.4   Albumin  3.8 - 4.8 g/dL 4.0  3.7  3.8   AST 0 - 40 IU/L 24  37  31   ALT 0 - 44 IU/L 17  23  24    Alk Phosphatase 44 - 121 IU/L 82  74  76   Total Bilirubin 0.0 - 1.2 mg/dL 0.5  0.3  1.0       Current Medications:    Current Outpatient Medications (Cardiovascular):    carvedilol  (COREG ) 6.25 MG tablet, Take 1 tablet (6.25 mg total) by mouth 2 (two) times daily with a meal.   rosuvastatin  (CRESTOR ) 20 MG tablet, Take 1 tablet (20 mg total) by mouth daily.    sildenafil  (REVATIO ) 20 MG tablet, TAKE 2-5 TABLETS BY MOUTH AS NEEDED PRIOR TO SEXUAL ACTIVITY   nitroGLYCERIN  (NITROSTAT ) 0.4 MG SL tablet, DISSOLVE 1 TAB UNDER TONGUE FOR CHEST PAIN - IF PAIN REMAINS AFTER 5 MIN, CALL 911 AND REPEAT DOSE. MAX 3 TABS IN 15 MINUTES - NEED APPOINTMENT FOR FURTHER REFILLS (Patient not taking: Reported on 09/25/2023)   Current Outpatient Medications (Analgesics):    aspirin  81 MG tablet, Take 81 mg by mouth every evening.   Current Outpatient Medications (Hematological):    ferrous gluconate  (FERGON) 324 MG tablet, Take 1 tablet (324 mg total) by mouth every other day.   vitamin B-12 (CYANOCOBALAMIN ) 1000 MCG tablet, Take 1,000 mcg by mouth daily.  Current Outpatient Medications (Other):    buPROPion  (WELLBUTRIN  XL) 300 MG 24 hr tablet, Take 300 mg by mouth daily.   Calcium -Magnesium -Vitamin D (CALCIUM  1200+D3 PO), 2 (two) times daily.   OVER THE COUNTER MEDICATION, Pain relieving foot cream   pantoprazole  (PROTONIX ) 40 MG tablet, TAKE 1 TABLET BY MOUTH DAILY   Polyethylene Glycol 3350  POWD, daily.   Vilazodone HCl (VIIBRYD PO), Take 20 mg by mouth daily.   Medical History:  Past Medical History:  Diagnosis Date   Acute hepatitis 01/26/2019   Acute kidney failure (HCC) 03/30/2018   AKI (acute kidney injury) (HCC)    Alcohol use disorder, mild, abuse 10/14/2019   Anginal pain (HCC)    Anxiety    Arthritis    SPINE    Arthritis    Cellulitis 03/30/2018   Coronary artery disease    s/p multiple percutaneous interventions, PCI 202 for DMI and DES distal RCA and CFX-OM 2006   Coronary artery disease    Depression    Depression, major, single episode, mild (HCC) 06/15/2008   Qualifier: Diagnosis of  By: Wynetta, CMA, Carol     GERD (gastroesophageal reflux disease)    Hyperlipidemia    mixed   Hyperlipidemia    Hypertension    Major depressive disorder, single episode 06/15/2008   Overview:  Overview:  Qualifier: Diagnosis of  By: Wynetta REYNOLDS Ivanoff    MI  (  myocardial infarction) (HCC)    Myocardial infarction (HCC)    ? 2006   Numbness and tingling in right hand 07/13/2019   Peripheral neuropathy    calves and both feet   Peripheral neuropathy    calves and both feet   Puncture wound of right foot 03/07/2020   Rhabdomyolysis 02/02/2019   Severe sepsis with acute organ dysfunction (HCC) 12/03/2016   Spondylitis, ankylosing (HCC)    Allergies: No Known Allergies   Surgical History:  He  has a past surgical history that includes Cardiac catheterization; Coronary angioplasty with stent; Coronary angioplasty with stent (03/26/2013); Finger surgery; Coronary artery bypass graft (N/A, 10/29/2013); Intraoprative transesophageal echocardiogram (N/A, 10/29/2013); left heart catheterization with coronary angiogram (N/A, 10/01/2011); left heart catheterization with coronary angiogram (N/A, 03/26/2013); left heart catheterization with coronary angiogram (N/A, 10/25/2013); Coronary artery bypass graft; Coronary angioplasty with stent; Finger surgery; Knee arthroscopy w/ laser (2003); Tonsillectomy; Achilles tendon repair; and Cataract extraction (Bilateral). Family History:  His family history includes Breast cancer (age of onset: 15) in his mother; Heart attack in his brother; Heart disease in his brother, father, maternal grandfather, and another family member; Leukemia in his maternal grandmother; Migraines in his father; Prostate cancer in his brother.  REVIEW OF SYSTEMS  : All other systems reviewed and negative except where noted in the History of Present Illness.  PHYSICAL EXAM: BP 110/60 (BP Location: Left Arm, Patient Position: Sitting, Cuff Size: Large)   Pulse 76   Ht 5' 9.5 (1.765 m) Comment: height measured without shoes  Wt 254 lb (115.2 kg)   BMI 36.97 kg/m  Physical Exam   GENERAL APPEARANCE: Well nourished, in no apparent distress. HEENT: No cervical lymphadenopathy, unremarkable thyroid , sclerae anicteric, conjunctiva  pink. RESPIRATORY: Respiratory effort normal, breath sounds clear to auscultation bilaterally without rales, rhonchi, or wheezing. CARDIO: Regular rate and rhythm with no murmurs, rubs, or gallops, peripheral pulses intact. ABDOMEN: Soft, non-distended, active bowel sounds in all four quadrants, no tenderness to palpation, no rebound, no mass appreciated. Umbilical and ventral hernia present. Left abdominal hematoma with induration, healing. No hepatomegaly. RECTAL: Declines. MUSCULOSKELETAL: Full range of motion, normal gait, without edema. SKIN: Dry, intact without rashes or lesions. No jaundice. NEURO: Alert, oriented, no focal deficits. PSYCH: Cooperative, normal mood and affect.      Alan JONELLE Coombs, PA-C 2:13 PM

## 2023-09-25 NOTE — Patient Instructions (Signed)
 _______________________________________________________  If your blood pressure at your visit was 140/90 or greater, please contact your primary care physician to follow up on this. _______________________________________________________  If you are age 74 or older, your body mass index should be between 23-30. Your Body mass index is 36.97 kg/m. If this is out of the aforementioned range listed, please consider follow up with your Primary Care Provider.  If you are age 45 or younger, your body mass index should be between 19-25. Your Body mass index is 36.97 kg/m. If this is out of the aformentioned range listed, please consider follow up with your Primary Care Provider.   ________________________________________________________  The [facility] providers would like to encourage you to use MYCHART to communicate with providers for non-urgent requests or questions.  Due to long hold times on the telephone, sending your provider a message by [facility] may be a faster and more efficient way to get a response.  Please allow 48 business hours for a response.  Please remember that this is for non-urgent requests.  _______________________________________________________  Your provider has requested that you go to the basement level for lab work before leaving today. Press B on the elevator. The lab is located at the first door on the left as you exit the elevator.  You have been scheduled for an abdominal ultrasound at [facility] (1st floor of hospital) on 10-03-23 at 8:30am. Please arrive 15 minutes prior to your appointment for registration. Make certain not to have anything to eat or drink after midnight the night prior to your appointment. Should you need to reschedule your appointment, please contact radiology at [phone]. This test typically takes about 30 minutes to perform.  You have been scheduled for an endoscopy and colonoscopy. Please follow the written instructions given to  you at your visit today.  If you use inhalers (even only as needed), please bring them with you on the day of your procedure.  DO NOT TAKE 7 DAYS PRIOR TO TEST- Trulicity (dulaglutide) Ozempic, Wegovy (semaglutide) Mounjaro (tirzepatide) Bydureon Bcise (exanatide extended release)  DO NOT TAKE 1 DAY PRIOR TO YOUR TEST Rybelsus (semaglutide) Adlyxin (lixisenatide) Victoza (liraglutide) Byetta (exanatide) ___________________________________________________________________________  Due to recent changes in healthcare laws, you may see the results of your imaging and laboratory studies on MyChart before your provider has had a chance to review them.  We understand that in some cases there may be results that are confusing or concerning to you. Not all laboratory results come back in the same time frame and the provider may be waiting for multiple results in order to interpret others.  Please give us  48 hours in order for your provider to thoroughly review all the results before contacting the office for clarification of your results.   VISIT SUMMARY: Today, you were seen for a follow-up on your iron deficiency anemia. We discussed your current symptoms, medical history, and reviewed your recent lab results. We also addressed your GERD, ventral hernia, fatty liver, ankylosing spondylitis, and the recent hematoma from your fall.  YOUR PLAN: -IRON DEFICIENCY ANEMIA: Iron deficiency anemia means you have low levels of iron in your blood, which can cause fatigue and weakness. We will recheck your complete blood count (CBC) and iron levels. Additionally, we will schedule an upper endoscopy (EGD) and colonoscopy to look for any sources of bleeding in your gastrointestinal tract. Please make sure you have a driver for the procedure and follow the preparation instructions provided.  -GASTROESOPHAGEAL REFLUX DISEASE (GERD): GERD is a  condition where stomach acid frequently flows back into the tube  connecting your mouth and stomach, causing irritation. Your symptoms are well-controlled with Protonix . Continue taking your medication daily and avoid spicy foods like ghost peppers  -FATTY LIVER: Fatty liver means you have extra fat in your liver. Your liver function tests have been normal, but we will order an abdominal ultrasound to reassess your liver status.  -HEALING HEMATOMA: A hematoma is a collection of blood outside of blood vessels, usually caused by an injury. Your hematoma from the recent fall is healing, and there is some firmness in the area.  INSTRUCTIONS: Please follow up for the recheck of your CBC and iron levels. Ensure you have a driver for your EGD and colonoscopy, and follow the preparation instructions provided. We will also schedule an abdominal ultrasound to reassess your liver status.  Thank you for entrusting me with your care and choosing [facility].  Alan Coombs, PA-C

## 2023-09-26 ENCOUNTER — Ambulatory Visit: Payer: Self-pay | Admitting: Physician Assistant

## 2023-09-26 ENCOUNTER — Other Ambulatory Visit: Payer: Self-pay | Admitting: Student

## 2023-09-26 DIAGNOSIS — I10 Essential (primary) hypertension: Secondary | ICD-10-CM

## 2023-10-03 ENCOUNTER — Ambulatory Visit (HOSPITAL_COMMUNITY)
Admission: RE | Admit: 2023-10-03 | Discharge: 2023-10-03 | Disposition: A | Discharge status: Home / Self Care | Source: Ambulatory Visit | Attending: Physician Assistant | Admitting: Physician Assistant

## 2023-10-03 DIAGNOSIS — K760 Fatty (change of) liver, not elsewhere classified: Secondary | ICD-10-CM | POA: Insufficient documentation

## 2023-10-03 DIAGNOSIS — R7989 Other specified abnormal findings of blood chemistry: Secondary | ICD-10-CM | POA: Diagnosis not present

## 2023-10-03 DIAGNOSIS — K7689 Other specified diseases of liver: Secondary | ICD-10-CM | POA: Diagnosis not present

## 2023-10-06 ENCOUNTER — Ambulatory Visit

## 2023-10-06 VITALS — Ht 74.0 in | Wt 245.0 lb

## 2023-10-06 DIAGNOSIS — Z0000 Encounter for general adult medical examination without abnormal findings: Secondary | ICD-10-CM

## 2023-10-06 NOTE — Progress Notes (Signed)
 Because this visit was a virtual/telehealth visit,  certain criteria was not obtained, such a blood pressure, CBG if applicable, and timed get up and go. Any medications not marked as taking were not mentioned during the medication reconciliation part of the visit. Any vitals not documented were not able to be obtained due to this being a telehealth visit or patient was unable to self-report a recent blood pressure reading due to a lack of equipment at home via telehealth. Vitals that have been documented are verbally provided by the patient.   Subjective:   Gregory Hansen is a 73 y.o. who presents for a Medicare Wellness preventive visit.  As a reminder, Annual Wellness Visits don't include a physical exam, and some assessments may be limited, especially if this visit is performed virtually. We may recommend an in-person follow-up visit with your provider if needed.  Visit Complete: Virtual I connected with  Gregory Hansen on 10/06/23 by a audio enabled telemedicine application and verified that I am speaking with the correct person using two identifiers.  Patient Location: Home  Provider Location: Home Office  I discussed the limitations of evaluation and management by telemedicine. The patient expressed understanding and agreed to proceed.  Vital Signs: Because this visit was a virtual/telehealth visit, some criteria may be missing or patient reported. Any vitals not documented were not able to be obtained and vitals that have been documented are patient reported.  VideoDeclined- This patient declined Librarian, academic. Therefore the visit was completed with audio only.  Persons Participating in Visit: Patient.  AWV Questionnaire: Yes: Patient Medicare AWV questionnaire was completed by the patient on 09/30/2023; I have confirmed that all information answered by patient is correct and no changes since this date.  Cardiac Risk Factors include: advanced age  (>68men, >8 women);dyslipidemia;family history of premature cardiovascular disease;hypertension;male gender;obesity (BMI >30kg/m2);sedentary lifestyle     Objective:    Today's Vitals   10/06/23 1627  Weight: 245 lb (111.1 kg)  Height: 6' 2 (1.88 m)  PainSc: 0-No pain   Body mass index is 31.46 kg/m.     10/06/2023    4:29 PM 08/11/2023   10:21 AM 08/07/2023    2:02 PM 01/31/2023    2:39 PM 01/20/2023    1:44 PM 07/22/2022    2:12 PM 02/21/2022    3:33 PM  Advanced Directives  Does Patient Have a Medical Advance Directive? Yes No No No No Yes No  Type of Estate agent of [place];Living will     Living will;Healthcare Power of Attorney   Does patient want to make changes to medical advance directive? No - Patient declined     No - Patient declined   Copy of Healthcare Power of Attorney in Chart? Yes - validated most recent copy scanned in chart (See row information)     Yes - validated most recent copy scanned in chart (See row information)   Would patient like information on creating a medical advance directive? No - Patient declined No - Patient declined No - Patient declined No - Patient declined No - Patient declined  No - Patient declined    Current Medications (verified) Outpatient Encounter Medications as of 10/06/2023  Medication Sig   aspirin  81 MG tablet Take 81 mg by mouth every evening.    buPROPion  (WELLBUTRIN  XL) 300 MG 24 hr tablet Take 300 mg by mouth daily.   Calcium -Magnesium -Vitamin D (CALCIUM  1200+D3 PO) 2 (two) times daily.  carvedilol  (COREG ) 6.25 MG tablet TAKE 1 TABLET BY MOUTH 2 TIMES A DAY WITH A MEAL   ferrous gluconate  (FERGON) 324 MG tablet Take 1 tablet (324 mg total) by mouth every other day.   nitroGLYCERIN  (NITROSTAT ) 0.4 MG SL tablet DISSOLVE 1 TAB UNDER TONGUE FOR CHEST PAIN - IF PAIN REMAINS AFTER 5 MIN, CALL 911 AND REPEAT DOSE. MAX 3 TABS IN 15 MINUTES - NEED APPOINTMENT FOR FURTHER REFILLS (Patient not taking: Reported on  09/25/2023)   OVER THE COUNTER MEDICATION Pain relieving foot cream   pantoprazole  (PROTONIX ) 40 MG tablet TAKE 1 TABLET BY MOUTH DAILY   Polyethylene Glycol 3350  POWD daily.   rosuvastatin  (CRESTOR ) 20 MG tablet Take 1 tablet (20 mg total) by mouth daily.   sildenafil  (REVATIO ) 20 MG tablet TAKE 2-5 TABLETS BY MOUTH AS NEEDED PRIOR TO SEXUAL ACTIVITY   Vilazodone HCl (VIIBRYD PO) Take 20 mg by mouth daily.    vitamin B-12 (CYANOCOBALAMIN ) 1000 MCG tablet Take 1,000 mcg by mouth daily.   No facility-administered encounter medications on file as of 10/06/2023.    Allergies (verified) Patient has no known allergies.   History: Past Medical History:  Diagnosis Date   Acute hepatitis 01/26/2019   Acute kidney failure (HCC) 03/30/2018   AKI (acute kidney injury) (HCC)    Alcohol use disorder, mild, abuse 10/14/2019   Anginal pain (HCC)    Anxiety    Arthritis    SPINE    Arthritis    Cellulitis 03/30/2018   Coronary artery disease    s/p multiple percutaneous interventions, PCI 202 for DMI and DES distal RCA and CFX-OM 2006   Coronary artery disease    Depression    Depression, major, single episode, mild (HCC) 06/15/2008   Qualifier: Diagnosis of  By: Wynetta, CMA, Carol     GERD (gastroesophageal reflux disease)    Hyperlipidemia    mixed   Hyperlipidemia    Hypertension    Major depressive disorder, single episode 06/15/2008   Overview:  Overview:  Qualifier: Diagnosis of  By: Wynetta, CMA, Carol    MI (myocardial infarction) (HCC)    Myocardial infarction (HCC)    ? 2006   Numbness and tingling in right hand 07/13/2019   Peripheral neuropathy    calves and both feet   Peripheral neuropathy    calves and both feet   Puncture wound of right foot 03/07/2020   Rhabdomyolysis 02/02/2019   Severe sepsis with acute organ dysfunction (HCC) 12/03/2016   Spondylitis, ankylosing (HCC)    Past Surgical History:  Procedure Laterality Date   ACHILLES TENDON REPAIR     CARDIAC CATHETERIZATION      stent RCA June3, 2002, Stent OM/PTCA circumflex August 13, 2000   CATARACT EXTRACTION Bilateral    CORONARY ANGIOPLASTY WITH STENT PLACEMENT     first one in 2006; had another place 3 months ago (August 2013)   CORONARY ANGIOPLASTY WITH STENT PLACEMENT  03/26/2013   RCA           DR COOPER   CORONARY ANGIOPLASTY WITH STENT PLACEMENT     CORONARY ARTERY BYPASS GRAFT N/A 10/29/2013   Procedure: CORONARY ARTERY BYPASS GRAFTING x 4 (LIMA-LAD, SVG-OM, SVG-PD-PL) ENDOSCOPIC VEIN HARVEST RIGHT THIGH;  Surgeon: Dorise MARLA Fellers, MD;  Location: [facility] OR;  Service: Open Heart Surgery;  Laterality: N/A;   CORONARY ARTERY BYPASS GRAFT     FINGER SURGERY     5  TH     DIGIT RIGHT HAND  FINGER SURGERY     INTRAOPERATIVE TRANSESOPHAGEAL ECHOCARDIOGRAM N/A 10/29/2013   Procedure: INTRAOPERATIVE TRANSESOPHAGEAL ECHOCARDIOGRAM;  Surgeon: Dorise MARLA Fellers, MD;  Location: [facility] OR;  Service: Open Heart Surgery;  Laterality: N/A;   KNEE ARTHROSCOPY W/ LASER  2003   LEFT HEART CATHETERIZATION WITH CORONARY ANGIOGRAM N/A 10/01/2011   Procedure: LEFT HEART CATHETERIZATION WITH CORONARY ANGIOGRAM;  Surgeon: Ozell Fell, MD;  Location: [facility] CATH LAB;  Service: Cardiovascular;  Laterality: N/A;   LEFT HEART CATHETERIZATION WITH CORONARY ANGIOGRAM N/A 03/26/2013   Procedure: LEFT HEART CATHETERIZATION WITH CORONARY ANGIOGRAM;  Surgeon: Ozell JONETTA Fell, MD;  Location: [facility] CATH LAB;  Service: Cardiovascular;  Laterality: N/A;   LEFT HEART CATHETERIZATION WITH CORONARY ANGIOGRAM N/A 10/25/2013   Procedure: LEFT HEART CATHETERIZATION WITH CORONARY ANGIOGRAM;  Surgeon: Ozell JONETTA Fell, MD;  Location: [facility] CATH LAB;  Service: Cardiovascular;  Laterality: N/A;   TONSILLECTOMY     Family History  Problem Relation Age of Onset   Breast cancer Mother 67   Heart disease Father    Migraines Father    Heart disease Brother    Prostate cancer Brother    Heart attack Brother    Leukemia Maternal Grandmother    Heart disease  Maternal Grandfather    Heart disease Other        positive for cardiac disease and both brothers have had cardiac events   Allergic rhinitis Neg Hx    Angioedema Neg Hx    Asthma Neg Hx    Atopy Neg Hx    Immunodeficiency Neg Hx    Eczema Neg Hx    Urticaria Neg Hx    Colon cancer Neg Hx    Colon polyps Neg Hx    Esophageal cancer Neg Hx    Rectal cancer Neg Hx    Stomach cancer Neg Hx    Social History   Socioeconomic History   Marital status: Divorced    Spouse name: n/a   Number of children: 2   Years of education: Masters degree   Highest education level: Master's degree (e.g., MA, MS, MEng, MEd, MSW, MBA)  Occupational History   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   Drug use: No   Sexual activity: Not Currently    Birth control/protection: Condom  Other Topics Concern   Not on file  Social History Narrative   ** Merged History Encounter **       Lives in a one level home with son, and dog, Chief Operating Officer. Works out of the house. 2 steps into house. Smoke alarms in house. No grab bars in bathroom. Has some area rugs and throw rugs with backing.      No pets of his own. Restores antique pens, does like to  walk and cycle but having problems with peripheral neuropathy.       Firearm is safely stored.      Wears seat belts in vehicle. Does use sunblock and tries to avoid  sun due to vitiligo and previous sunburns.      Eats varied diet. Likes protein, fresh fruits and veggies. Watches fats, saturated fats. Drink a lot of water.    Social Drivers of Corporate investment banker Strain: Low Risk  (10/06/2023)   Overall Financial Resource Strain (CARDIA)    Difficulty of Paying Living Expenses: Not hard at all  Food Insecurity: No Food Insecurity (10/06/2023)   Hunger Vital Sign    Worried About Running Out of Food in the Last Year: Never true    Ran Out of Food in the Last Year: Never true  Transportation Needs: No Transportation Needs (10/06/2023)   PRAPARE - Administrator, Civil Service (Medical): No    Lack of Transportation (Non-Medical): No  Physical Activity: Inactive (10/06/2023)   Exercise Vital Sign    Days of Exercise per Week: 0 days    Minutes of Exercise per Session: 20 min  Stress: No Stress Concern Present (09/30/2023)   [employer] of Occupational Health - Occupational Stress Questionnaire    Feeling of Stress: Not at all  Social Connections: Moderately Integrated (10/06/2023)   Social Connection and Isolation Panel    Frequency of Communication with Friends and Family: Twice a week    Frequency of Social Gatherings with Friends and Family: Once a week    Attends Religious Services: More than 4 times per year    Active Member of [employer] or Organizations: No    Attends Engineer, structural: More than 4 times per year    Marital Status: Divorced  Recent Concern: Social Connections - Moderately Isolated (09/30/2023)   Social Connection and Isolation Panel    Frequency of Communication with Friends and Family: Twice a week    Frequency of Social Gatherings with Friends and Family: Once a week    Attends Religious Services: More than 4 times per year    Active Member of [employer] or Organizations: No    Attends Engineer, structural: Not on file    Marital Status: Divorced    Tobacco Counseling Counseling given:  Not Answered Tobacco comments: no plans to start back    Clinical Intake:  Pre-visit preparation completed: Yes  Pain : No/denies pain Pain Score: 0-No pain     BMI - recorded: 35.66 Nutritional Status: BMI > 30  Obese Nutritional Risks: None Diabetes: No  Lab Results  Component Value Date   HGBA1C 5.1 01/26/2019   HGBA1C 5.2 04/01/2018   HGBA1C 5.2 11/23/2016     How often do you need to have someone help you when you read instructions, pamphlets, or other written materials from your doctor or pharmacy?: 1 - Never  Interpreter Needed?: No  Information entered by :: Zarriah Starkel N. Irving Bloor, LPN.   Activities of Daily Living     10/06/2023    4:32 PM 09/30/2023    5:48 PM  In your present state of health, do you have any difficulty performing the following activities:  Hearing? 0 0  Vision? 0 0  Difficulty concentrating or making decisions? 0 0  Walking or climbing stairs? 1 1  Dressing or bathing? 0 0  Doing errands, shopping? 0 0  Preparing Food and eating ? N N  Using the Toilet? N N  In the past six months, have you accidently leaked urine? N N  Do you have problems with loss of bowel control? N N  Managing your Medications? N   Managing your Finances? N N  Housekeeping or managing your Housekeeping? N N    Patient Care Team: Howell Lunger, DO as PCP - General (Family Medicine) Wonda Sharper, MD as PCP - Cardiology (Cardiology) Elihue Mitzie POUR, MD as Referring Physician (Ophthalmology) Windle Fret, NP (Inactive) as Nurse Practitioner (Nurse Practitioner) Cary Doffing, MD as Consulting Physician (Dermatology) Gaspar Kung, MD as Referring Physician (Orthopedic Surgery) Regenia Prentice Clack, MD as Consulting Physician (Ophthalmology) Dolphus Reiter, MD as Consulting Physician (Rheumatology)  I have updated your Care Teams any recent Medical Services you may have received from other providers in the past year.     Assessment:   This is a  routine wellness examination for Gregory Hansen.  Hearing/Vision screen Hearing Screening - Comments:: Adequate hearing . Vision Screening - Comments:: Adequate vision with rx eyeglasses.  Up to date with [facility].   Goals Addressed             This Visit's Progress    10/06/23: My goal is to lose weight and do chair yoga.  I want to lose at least 15-20 pounds..         Depression Screen     10/06/2023    4:30 PM 08/07/2023    2:02 PM 01/31/2023    2:40 PM 01/20/2023    2:26 PM 07/22/2022    2:21 PM 02/21/2022    3:32 PM 11/05/2021    2:52 PM  PHQ 2/9 Scores  PHQ - 2 Score 0 0 0 0 0 0 0  PHQ- 9 Score 0 1 1 1   0 1    Fall Risk     10/06/2023    4:44 PM 09/30/2023    5:48 PM 08/07/2023    2:03 PM 01/31/2023    2:40 PM 01/20/2023    2:25 PM  Fall Risk   Falls in the past year? 1 1 1  0 0  Number falls in past yr: 1 1 0 0 0  Injury with Fall? 1 1 1  0 0  Risk for fall due to : History of fall(s);Impaired balance/gait;Orthopedic patient   No Fall Risks   Follow up Falls evaluation completed;Education provided   Falls prevention discussed     MEDICARE RISK AT HOME:  Medicare Risk at Home Any stairs in or around the home?: No Home free of loose throw rugs in walkways, pet beds, electrical cords, etc?: Yes Adequate lighting in your home to reduce risk of falls?: Yes Life alert?: No Use of a cane, walker or w/c?: Yes Grab bars in the bathroom?: No Shower chair or bench in shower?: Yes Elevated toilet seat or a handicapped toilet?: No  TIMED UP AND GO:  Was the test performed?  No  Cognitive Function: Declined/Normal: No cognitive concerns noted by patient or family. Patient alert, oriented, able to answer questions appropriately and recall recent events. No signs of memory loss or confusion.    10/06/2023    4:32 PM 12/18/2017    3:28 PM  MMSE - Mini Mental State Exam  Not completed: Unable to complete   Orientation to time  5  Orientation to Place  5   Registration  3  Attention/ Calculation  5  Recall  3  Language- name 2 objects  2  Language- repeat  1  Language- follow 3  step command  3  Language- read & follow direction  1  Write a sentence  1  Copy design  1  Total score  30        10/06/2023    4:32 PM 07/22/2022    2:13 PM 12/18/2017    3:27 PM  6CIT Screen  What Year? 0 points 0 points 0 points  What month? 0 points 0 points 0 points  What time? 0 points 0 points 0 points  Count back from 20 0 points 0 points 0 points  Months in reverse 0 points 0 points 0 points  Repeat phrase 0 points 0 points 0 points  Total Score 0 points 0 points 0 points    Immunizations Immunization History  Administered Date(s) Administered   Fluad Quad(high Dose 65+) 12/05/2022   Influenza, High Dose Seasonal PF 11/23/2016   Influenza,inj,Quad PF,6+ Mos 12/18/2017, 11/20/2018   Influenza-Unspecified 12/02/2019, 12/01/2020, 12/10/2021   PFIZER(Purple Top)SARS-COV-2 Vaccination 05/03/2019, 05/24/2019, 12/07/2019, 06/06/2020   Pfizer Covid-19 Vaccine Bivalent Booster 53yrs & up 12/01/2020   Pfizer(Comirnaty)Fall Seasonal Vaccine 12 years and older 12/05/2022   Pneumococcal Conjugate-13 01/20/2017   Pneumococcal Polysaccharide-23 07/01/2015, 04/01/2018   Tdap 06/30/2015   Unspecified SARS-COV-2 Vaccination 12/10/2021   Zoster Recombinant(Shingrix) 06/28/2019, 09/13/2019    Screening Tests Health Maintenance  Topic Date Due   COVID-19 Vaccine (8 - Pfizer risk 2024-25 season) 06/04/2023   INFLUENZA VACCINE  10/10/2023   Medicare Annual Wellness (AWV)  10/05/2024   DTaP/Tdap/Td (2 - Td or Tdap) 06/29/2025   Colonoscopy  02/06/2030   Pneumococcal Vaccine: 50+ Years  Completed   Hepatitis C Screening  Completed   Zoster Vaccines- Shingrix  Completed   Hepatitis B Vaccines  Aged Out   HPV VACCINES  Aged Out   Meningococcal B Vaccine  Aged Out   Fecal DNA (Cologuard)  Discontinued    Health Maintenance  Health Maintenance Due   Topic Date Due   COVID-19 Vaccine (8 - Pfizer risk 2024-25 season) 06/04/2023   Health Maintenance Items Addressed: Yes   Additional Screening:  Vision Screening: Recommended annual ophthalmology exams for early detection of glaucoma and other disorders of the eye. Would you like a referral to an eye doctor? No    Dental Screening: Recommended annual dental exams for proper oral hygiene  Community Resource Referral / Chronic Care Management: CRR required this visit?  No   CCM required this visit?  No   Plan:    I have personally reviewed and noted the following in the patient's chart:   Medical and social history Use of alcohol, tobacco or illicit drugs  Current medications and supplements including opioid prescriptions. Patient is not currently taking opioid prescriptions. Functional ability and status Nutritional status Physical activity Advanced directives List of other physicians Hospitalizations, surgeries, and ER visits in previous 12 months Vitals Screenings to include cognitive, depression, and falls Referrals and appointments  In addition, I have reviewed and discussed with patient certain preventive protocols, quality metrics, and best practice recommendations. A written personalized care plan for preventive services as well as general preventive health recommendations were provided to patient.   Roz LOISE Fuller, LPN   [id number]   After Visit Summary: (MyChart) Due to this being a telephonic visit, the after visit summary with patients personalized plan was offered to patient via MyChart   Notes: Nothing significant to report at this time.

## 2023-10-06 NOTE — Patient Instructions (Addendum)
 Mr. Boody , Thank you for taking time out of your busy schedule to complete your Annual Wellness Visit with me. I enjoyed our conversation and look forward to speaking with you again next year. I, as well as your care team,  appreciate your ongoing commitment to your health goals. Please review the following plan we discussed and let me know if I can assist you in the future. Your Game plan/ To Do List    Referrals: If you haven't heard from the office you've been referred to, please reach out to them at the phone provided.   Follow up Visits: Next Medicare AWV with our clinical staff: 10/07/2024 at 3:30 pm phone visit with Nurse Health Advisor   Have you seen your provider in the last 6 months (3 months if uncontrolled diabetes)? Yes Next Office Visit with your provider: Patient will call to schedule  Clinician Recommendations:  Aim for 30 minutes of exercise or brisk walking, 6-8 glasses of water, and 5 servings of fruits and vegetables each day.       This is a list of the screening recommended for you and due dates:  Health Maintenance  Topic Date Due   COVID-19 Vaccine (8 - Pfizer risk 2024-25 season) 06/04/2023   Flu Shot  10/10/2023   Medicare Annual Wellness Visit  10/05/2024   DTaP/Tdap/Td vaccine (2 - Td or Tdap) 06/29/2025   Colon Cancer Screening  02/06/2030   Pneumococcal Vaccine for age over 36  Completed   Hepatitis C Screening  Completed   Zoster (Shingles) Vaccine  Completed   Hepatitis B Vaccine  Aged Out   HPV Vaccine  Aged Out   Meningitis B Vaccine  Aged Out   Cologuard (Stool DNA test)  Discontinued    Advanced directives: (In Chart) A copy of your advanced directives are scanned into your chart should your provider ever need it. Advance Care Planning is important because it:  [x]  Makes sure you receive the medical care that is consistent with your values, goals, and preferences  [x]  It provides guidance to your family and loved ones and reduces their  decisional burden about whether or not they are making the right decisions based on your wishes.  Follow the link provided in your after visit summary or read over the paperwork we have mailed to you to help you started getting your Advance Directives in place. If you need assistance in completing these, please reach out to us  so that we can help you!  See attachments for Preventive Care and Fall Prevention Tips.

## 2023-10-13 DIAGNOSIS — R809 Proteinuria, unspecified: Secondary | ICD-10-CM | POA: Diagnosis not present

## 2023-10-22 ENCOUNTER — Other Ambulatory Visit: Payer: Self-pay | Admitting: Cardiovascular Disease

## 2023-10-23 ENCOUNTER — Other Ambulatory Visit: Payer: Self-pay | Admitting: Student

## 2023-10-29 ENCOUNTER — Encounter: Payer: Self-pay | Admitting: Gastroenterology

## 2023-11-03 ENCOUNTER — Other Ambulatory Visit: Payer: Self-pay | Admitting: Medical Genetics

## 2023-11-07 ENCOUNTER — Ambulatory Visit: Admitting: Gastroenterology

## 2023-11-07 ENCOUNTER — Encounter: Payer: Self-pay | Admitting: Gastroenterology

## 2023-11-07 VITALS — BP 178/89 | HR 72 | Temp 98.0°F | Resp 15 | Ht 69.5 in | Wt 254.0 lb

## 2023-11-07 DIAGNOSIS — K2970 Gastritis, unspecified, without bleeding: Secondary | ICD-10-CM

## 2023-11-07 DIAGNOSIS — D120 Benign neoplasm of cecum: Secondary | ICD-10-CM | POA: Diagnosis not present

## 2023-11-07 DIAGNOSIS — I2510 Atherosclerotic heart disease of native coronary artery without angina pectoris: Secondary | ICD-10-CM | POA: Diagnosis not present

## 2023-11-07 DIAGNOSIS — D123 Benign neoplasm of transverse colon: Secondary | ICD-10-CM | POA: Diagnosis not present

## 2023-11-07 DIAGNOSIS — K641 Second degree hemorrhoids: Secondary | ICD-10-CM

## 2023-11-07 DIAGNOSIS — K3189 Other diseases of stomach and duodenum: Secondary | ICD-10-CM

## 2023-11-07 DIAGNOSIS — D125 Benign neoplasm of sigmoid colon: Secondary | ICD-10-CM

## 2023-11-07 DIAGNOSIS — K219 Gastro-esophageal reflux disease without esophagitis: Secondary | ICD-10-CM

## 2023-11-07 DIAGNOSIS — K317 Polyp of stomach and duodenum: Secondary | ICD-10-CM

## 2023-11-07 DIAGNOSIS — D509 Iron deficiency anemia, unspecified: Secondary | ICD-10-CM

## 2023-11-07 DIAGNOSIS — Z860101 Personal history of adenomatous and serrated colon polyps: Secondary | ICD-10-CM

## 2023-11-07 DIAGNOSIS — K5520 Angiodysplasia of colon without hemorrhage: Secondary | ICD-10-CM

## 2023-11-07 DIAGNOSIS — K635 Polyp of colon: Secondary | ICD-10-CM

## 2023-11-07 DIAGNOSIS — D122 Benign neoplasm of ascending colon: Secondary | ICD-10-CM

## 2023-11-07 DIAGNOSIS — K5730 Diverticulosis of large intestine without perforation or abscess without bleeding: Secondary | ICD-10-CM

## 2023-11-07 DIAGNOSIS — I10 Essential (primary) hypertension: Secondary | ICD-10-CM | POA: Diagnosis not present

## 2023-11-07 MED ORDER — SODIUM CHLORIDE 0.9 % IV SOLN: 500.0000 mL | Freq: Once | INTRAVENOUS | Status: DC

## 2023-11-07 NOTE — Progress Notes (Signed)
 Called to room to assist during endoscopic procedure.  Patient ID and intended procedure confirmed with present staff. Received instructions for my participation in the procedure from the performing physician.

## 2023-11-07 NOTE — Op Note (Signed)
 [facility] Patient Name: Gregory Hansen Procedure Date: 11/07/2023 1:07 PM MRN: [id number] Endoscopist: Sandor Flatter , MD, [id number] Age: 73 Referring MD:  Date of Birth: 1950-10-14 Gender: Male Account #: [id number] Procedure:                Colonoscopy Indications:              Iron deficiency anemia Medicines:                Monitored Anesthesia Care Procedure:                Pre-Anesthesia Assessment:                           - Prior to the procedure, a History and Physical                            was performed, and patient medications and                            allergies were reviewed. The patient's tolerance of                            previous anesthesia was also reviewed. The risks                            and benefits of the procedure and the sedation                            options and risks were discussed with the patient.                            All questions were answered, and informed consent                            was obtained. Prior Anticoagulants: The patient has                            taken no anticoagulant or antiplatelet agents. ASA                            Grade Assessment: III - A patient with severe                            systemic disease. After reviewing the risks and                            benefits, the patient was deemed in satisfactory                            condition to undergo the procedure.                           After obtaining informed consent, the colonoscope  was passed under direct vision. Throughout the                            procedure, the patient's blood pressure, pulse, and                            oxygen saturations were monitored continuously. The                            CF [id number] #[id number] was introduced through the anus                            and advanced to the the cecum, identified by                            appendiceal orifice and ileocecal  valve. The                            colonoscopy was performed without difficulty. The                            patient tolerated the procedure well. The quality                            of the bowel preparation was good. The ileocecal                            valve, appendiceal orifice, and rectum were                            photographed. Scope In: 1:52:06 PM Scope Out: 2:21:32 PM Scope Withdrawal Time: 0 hours 25 minutes 53 seconds  Total Procedure Duration: 0 hours 29 minutes 26 seconds  Findings:                 The perianal and digital rectal examinations were                            normal.                           Two small angioectasias with typical arborization                            were found in the proximal ascending colon and in                            the cecum.                           Four sessile polyps were found in the transverse                            colon, ascending colon, and cecum (2). The polyps  were 3 to 6 mm in size. These polyps were removed                            with a cold snare. Resection and retrieval were                            complete. Estimated blood loss was minimal.                           Two sessile polyps were found in the sigmoid colon.                            The polyps were 3 to 4 mm in size. These polyps                            were removed with a cold snare. Resection and                            retrieval were complete. Estimated blood loss was                            minimal.                           Multiple medium-mouthed and small-mouthed                            diverticula were found in the sigmoid colon.                           Non-bleeding internal hemorrhoids were found during                            retroflexion. The hemorrhoids were small and Grade                            II (internal hemorrhoids that prolapse but reduce                             spontaneously).                           The ascending colon revealed significantly                            excessive looping. Advancing the scope required                            using manual pressure and straightening and                            shortening the scope to obtain bowel loop reduction. Complications:            No immediate complications. Estimated Blood Loss:     Estimated blood loss was  minimal. Impression:               - Two colonic angioectasias.                           - Four 3 to 6 mm polyps in the transverse colon, in                            the ascending colon and in the cecum, removed with                            a cold snare. Resected and retrieved.                           - Two 3 to 4 mm polyps in the sigmoid colon,                            removed with a cold snare. Resected and retrieved.                           - Diverticulosis in the sigmoid colon.                           - Non-bleeding internal hemorrhoids.                           - There was significant looping of the colon. Recommendation:           - Patient has a contact number available for                            emergencies. The signs and symptoms of potential                            delayed complications were discussed with the                            patient. Return to normal activities tomorrow.                            Written discharge instructions were provided to the                            patient.                           - Resume previous diet.                           - Continue present medications.                           - Await pathology results.                           - Repeat colonoscopy for surveillance based on  pathology results.                           - Return to GI office PRN. Sandor Flatter, MD 11/07/2023 2:39:03 PM

## 2023-11-07 NOTE — Op Note (Signed)
 [facility] Patient Name: Gregory Hansen Procedure Date: 11/07/2023 1:08 PM MRN: [id number] Endoscopist: Sandor Flatter , MD, [id number] Age: 73 Referring MD:  Date of Birth: Oct 26, 1950 Gender: Male Account #: [id number] Procedure:                Upper GI endoscopy Indications:              Iron deficiency anemia                           Separately, history of GERD, well controlled on                            current therapy. Medicines:                Monitored Anesthesia Care Procedure:                Pre-Anesthesia Assessment:                           - Prior to the procedure, a History and Physical                            was performed, and patient medications and                            allergies were reviewed. The patient's tolerance of                            previous anesthesia was also reviewed. The risks                            and benefits of the procedure and the sedation                            options and risks were discussed with the patient.                            All questions were answered, and informed consent                            was obtained. Prior Anticoagulants: The patient has                            taken no anticoagulant or antiplatelet agents. ASA                            Grade Assessment: III - A patient with severe                            systemic disease. After reviewing the risks and                            benefits, the patient was deemed in satisfactory  condition to undergo the procedure.                           After obtaining informed consent, the endoscope was                            passed under direct vision. Throughout the                            procedure, the patient's blood pressure, pulse, and                            oxygen saturations were monitored continuously. The                            GIF [id number] #[id number] was introduced through the                             mouth, and advanced to the second part of duodenum.                            The upper GI endoscopy was accomplished without                            difficulty. The patient tolerated the procedure                            well. Scope In: Scope Out: Findings:                 The examined esophagus was normal.                           Scattered mild inflammation characterized by                            congestion (edema) and erythema was found in the                            gastric fundus, in the gastric body, at the                            incisura and in the gastric antrum. Biopsies were                            taken with a cold forceps for Helicobacter pylori                            testing. Estimated blood loss was minimal.                           A single 3 mm sessile polyp with no bleeding was                            found at the  pylorus. The polyp was removed with a                            cold biopsy forceps. Resection and retrieval were                            complete. Estimated blood loss was minimal.                           The examined duodenum was normal. Biopsies were                            taken with a cold forceps for histology. Estimated                            blood loss was minimal. Complications:            No immediate complications. Estimated Blood Loss:     Estimated blood loss was minimal. Impression:               - Normal esophagus.                           - Gastritis. Biopsied.                           - A single gastric polyp. Resected and retrieved.                           - Normal examined duodenum. Biopsied. Recommendation:           - Patient has a contact number available for                            emergencies. The signs and symptoms of potential                            delayed complications were discussed with the                            patient. Return to normal activities tomorrow.                             Written discharge instructions were provided to the                            patient.                           - Resume previous diet.                           - Continue present medications.                           - Await pathology results.                           -  Colonoscopy today.                           - If pathology results are unremarakable and                            continued iron deficiency anemia, plan for further                            small bowel interrogation with video capsule                            endoscopy. Sandor Flatter, MD 11/07/2023 2:34:11 PM

## 2023-11-07 NOTE — Patient Instructions (Signed)
 Take your b/p medication immediately when you get home. Resume all of your previous medications today as ordered. Read your discharge instructions. The biopsy results will be back in 2 weeks.   YOU HAD AN ENDOSCOPIC PROCEDURE TODAY AT THE [facility]:   Refer to the procedure report that was given to you for any specific questions about what was found during the examination.  If the procedure report does not answer your questions, please call your gastroenterologist to clarify.  If you requested that your care partner not be given the details of your procedure findings, then the procedure report has been included in a sealed envelope for you to review at your convenience later.  YOU SHOULD EXPECT: Some feelings of bloating in the abdomen. Passage of more gas than usual.  Walking can help get rid of the air that was put into your GI tract during the procedure and reduce the bloating. If you had a lower endoscopy (such as a colonoscopy or flexible sigmoidoscopy) you may notice spotting of blood in your stool or on the toilet paper. If you underwent a bowel prep for your procedure, you may not have a normal bowel movement for a few days.  Please Note:  You might notice some irritation and congestion in your nose or some drainage.  This is from the oxygen used during your procedure.  There is no need for concern and it should clear up in a day or so.  SYMPTOMS TO REPORT IMMEDIATELY:  Following lower endoscopy (colonoscopy or flexible sigmoidoscopy):  Excessive amounts of blood in the stool  Significant tenderness or worsening of abdominal pains  Swelling of the abdomen that is new, acute  Fever of 100F or higher  Following upper endoscopy (EGD)  Vomiting of blood or coffee ground material  New chest pain or pain under the shoulder blades  Painful or persistently difficult swallowing  New shortness of breath  Fever of 100F or higher  Black, tarry-looking stools  For urgent or  emergent issues, a gastroenterologist can be reached at any hour by calling [phone]. Do not use MyChart messaging for urgent concerns.    DIET:  We do recommend a small meal at first, but then you may proceed to your regular diet.  Drink plenty of fluids but you should avoid alcoholic beverages for 24 hours.  ACTIVITY:  You should plan to take it easy for the rest of today and you should NOT DRIVE or use heavy machinery until tomorrow (because of the sedation medicines used during the test).    FOLLOW UP: Our staff will call the number listed on your records the next business day following your procedure.  We will call around 7:15- 8:00 am to check on you and address any questions or concerns that you may have regarding the information given to you following your procedure. If we do not reach you, we will leave a message.     If any biopsies were taken you will be contacted by phone or by letter within the next 1-3 weeks.  Please call us  at [phone] if you have not heard about the biopsies in 3 weeks.    SIGNATURES/CONFIDENTIALITY: You and/or your care partner have signed paperwork which will be entered into your electronic medical record.  These signatures attest to the fact that that the information above on your After Visit Summary has been reviewed and is understood.  Full responsibility of the confidentiality of this discharge information lies with you and/or  your care-partner.

## 2023-11-07 NOTE — Progress Notes (Signed)
 Pt sedate, gd SR's, VSS, report to RN

## 2023-11-07 NOTE — Progress Notes (Signed)
Updated medical record with pt

## 2023-11-07 NOTE — Progress Notes (Signed)
 GASTROENTEROLOGY PROCEDURE H&P NOTE   Primary Care Physician: Howell Lunger, DO    Reason for Procedure:  Iron deficiency anemia  Plan:    EGD, colonoscopy  Patient is appropriate for endoscopic procedure(s) in the ambulatory ([facility]) setting.  The nature of the procedure, as well as the risks, benefits, and alternatives were carefully and thoroughly reviewed with the patient. Ample time for discussion and questions allowed. The patient understood, was satisfied, and agreed to proceed.     HPI: Gregory Hansen is a 73 y.o. male who presents for EGD and colonoscopy for evaluation of iron deficiency anemia.  No overt GI bleed.  Started oral iron in 07/2023 with mild constipation and started MiraLAX  for this with good response.  Does a history of GERD and has been on Protonix  40 mg daily for many many years.  Symptoms well-controlled on current therapy.  Colonoscopy in 01/2020 with 4 subcentimeter adenomatous, left-sided diverticulosis, internal hemorrhoids with recommendation to repeat in 3 years.  Past Medical History:  Diagnosis Date   Acute hepatitis 01/26/2019   Acute kidney failure (HCC) 03/30/2018   AKI (acute kidney injury) (HCC)    Alcohol use disorder, mild, abuse 10/14/2019   Anemia    Anginal pain (HCC)    Anxiety    Arthritis    SPINE    Arthritis    Cataract    bilateral removed   Cellulitis 03/30/2018   Coronary artery disease    s/p multiple percutaneous interventions, PCI 202 for DMI and DES distal RCA and CFX-OM 2006   Coronary artery disease    Depression    Depression, major, single episode, mild (HCC) 06/15/2008   Qualifier: Diagnosis of  By: Wynetta, CMA, Carol     GERD (gastroesophageal reflux disease)    Hyperlipidemia    mixed   Hyperlipidemia    Hypertension    Major depressive disorder, single episode 06/15/2008   Overview:  Overview:  Qualifier: Diagnosis of  By: Wynetta, CMA, Carol    MI (myocardial infarction) (HCC)    Myocardial  infarction (HCC)    ? 2006   Numbness and tingling in right hand 07/13/2019   Peripheral neuropathy    calves and both feet   Peripheral neuropathy    calves and both feet   Puncture wound of right foot 03/07/2020   Rhabdomyolysis 02/02/2019   Severe sepsis with acute organ dysfunction (HCC) 12/03/2016   Spondylitis, ankylosing (HCC)     Past Surgical History:  Procedure Laterality Date   ACHILLES TENDON REPAIR     CARDIAC CATHETERIZATION     stent RCA June3, 2002, Stent OM/PTCA circumflex August 13, 2000   CATARACT EXTRACTION Bilateral    COLONOSCOPY W/ POLYPECTOMY  2021   4 polyps, 3 yr recall, Toribio Cedar   CORONARY ANGIOPLASTY WITH STENT PLACEMENT     first one in 2006; had another place 3 months ago (August 2013)   CORONARY ANGIOPLASTY WITH STENT PLACEMENT  03/26/2013   RCA           DR COOPER   CORONARY ANGIOPLASTY WITH STENT PLACEMENT     CORONARY ARTERY BYPASS GRAFT N/A 10/29/2013   Procedure: CORONARY ARTERY BYPASS GRAFTING x 4 (LIMA-LAD, SVG-OM, SVG-PD-PL) ENDOSCOPIC VEIN HARVEST RIGHT THIGH;  Surgeon: Dorise MARLA Fellers, MD;  Location: [facility] OR;  Service: Open Heart Surgery;  Laterality: N/A;   CORONARY ARTERY BYPASS GRAFT     FINGER SURGERY     5  TH     DIGIT  RIGHT HAND   INTRAOPERATIVE TRANSESOPHAGEAL ECHOCARDIOGRAM N/A 10/29/2013   Procedure: INTRAOPERATIVE TRANSESOPHAGEAL ECHOCARDIOGRAM;  Surgeon: Dorise MARLA Fellers, MD;  Location: [facility] OR;  Service: Open Heart Surgery;  Laterality: N/A;   KNEE ARTHROSCOPY W/ LASER Left 2003   LEFT HEART CATHETERIZATION WITH CORONARY ANGIOGRAM N/A 10/01/2011   Procedure: LEFT HEART CATHETERIZATION WITH CORONARY ANGIOGRAM;  Surgeon: Ozell Fell, MD;  Location: [facility] CATH LAB;  Service: Cardiovascular;  Laterality: N/A;   LEFT HEART CATHETERIZATION WITH CORONARY ANGIOGRAM N/A 03/26/2013   Procedure: LEFT HEART CATHETERIZATION WITH CORONARY ANGIOGRAM;  Surgeon: Ozell JONETTA Fell, MD;  Location: [facility] CATH LAB;  Service: Cardiovascular;  Laterality:  N/A;   LEFT HEART CATHETERIZATION WITH CORONARY ANGIOGRAM N/A 10/25/2013   Procedure: LEFT HEART CATHETERIZATION WITH CORONARY ANGIOGRAM;  Surgeon: Ozell JONETTA Fell, MD;  Location: [facility] CATH LAB;  Service: Cardiovascular;  Laterality: N/A;   TONSILLECTOMY      Prior to Admission medications   Medication Sig Start Date End Date Taking? Authorizing Provider  aspirin  81 MG tablet Take 81 mg by mouth every evening.    Yes [provider]  buPROPion  (WELLBUTRIN  XL) 300 MG 24 hr tablet Take 300 mg by mouth daily.   Yes [provider]  Calcium -Magnesium -Vitamin D (CALCIUM  1200+D3 PO) 2 (two) times daily. 05/27/22  Yes [provider]  carvedilol  (COREG ) 6.25 MG tablet TAKE 1 TABLET BY MOUTH 2 TIMES A DAY WITH A MEAL 09/27/23  Yes Howell Lunger, DO  pantoprazole  (PROTONIX ) 40 MG tablet TAKE 1 TABLET BY MOUTH DAILY 10/23/23  Yes Fell Ozell, MD  rosuvastatin  (CRESTOR ) 20 MG tablet TAKE 1 TABLET BY MOUTH DAILY 10/23/23  Yes Howell Lunger, DO  Vilazodone HCl 20 MG TABS Take 1 tablet by mouth daily. 10/07/23  Yes [provider]  vitamin B-12 (CYANOCOBALAMIN ) 1000 MCG tablet Take 1,000 mcg by mouth daily.   Yes [provider]  ferrous gluconate  (FERGON) 324 MG tablet Take 1 tablet (324 mg total) by mouth every other day. 08/11/23   Orlando Pond, DO  nitroGLYCERIN  (NITROSTAT ) 0.4 MG SL tablet DISSOLVE 1 TAB UNDER TONGUE FOR CHEST PAIN - IF PAIN REMAINS AFTER 5 MIN, CALL 911 AND REPEAT DOSE. MAX 3 TABS IN 15 MINUTES - NEED APPOINTMENT FOR FURTHER REFILLS Patient not taking: Reported on 09/25/2023 12/16/22   Lelon Hamilton T, PA-C  OVER THE COUNTER MEDICATION Pain relieving foot cream    [provider]  Polyethylene Glycol 3350  POWD daily. 05/10/22   [provider]  sildenafil  (REVATIO ) 20 MG tablet TAKE 2-5 TABLETS BY MOUTH AS NEEDED PRIOR TO SEXUAL ACTIVITY 09/30/22   Fell Ozell, MD    Current Outpatient Medications  Medication Sig  Dispense Refill   aspirin  81 MG tablet Take 81 mg by mouth every evening.      buPROPion  (WELLBUTRIN  XL) 300 MG 24 hr tablet Take 300 mg by mouth daily.     Calcium -Magnesium -Vitamin D (CALCIUM  1200+D3 PO) 2 (two) times daily.     carvedilol  (COREG ) 6.25 MG tablet TAKE 1 TABLET BY MOUTH 2 TIMES A DAY WITH A MEAL 60 tablet 1   pantoprazole  (PROTONIX ) 40 MG tablet TAKE 1 TABLET BY MOUTH DAILY 30 tablet 0   rosuvastatin  (CRESTOR ) 20 MG tablet TAKE 1 TABLET BY MOUTH DAILY 100 tablet 2   Vilazodone HCl 20 MG TABS Take 1 tablet by mouth daily.     vitamin B-12 (CYANOCOBALAMIN ) 1000 MCG tablet Take 1,000 mcg by mouth daily.     ferrous gluconate  [facility])  324 MG tablet Take 1 tablet (324 mg total) by mouth every other day. 90 tablet 0   nitroGLYCERIN  (NITROSTAT ) 0.4 MG SL tablet DISSOLVE 1 TAB UNDER TONGUE FOR CHEST PAIN - IF PAIN REMAINS AFTER 5 MIN, CALL 911 AND REPEAT DOSE. MAX 3 TABS IN 15 MINUTES - NEED APPOINTMENT FOR FURTHER REFILLS (Patient not taking: Reported on 09/25/2023) 25 tablet 1   OVER THE COUNTER MEDICATION Pain relieving foot cream     Polyethylene Glycol 3350  POWD daily.     sildenafil  (REVATIO ) 20 MG tablet TAKE 2-5 TABLETS BY MOUTH AS NEEDED PRIOR TO SEXUAL ACTIVITY 50 tablet 10   Current Facility-Administered Medications  Medication Dose Route Frequency Provider Last Rate Last Admin   0.9 %  sodium chloride  infusion  500 mL Intravenous Once Bladen Umar V, DO        Allergies as of 11/07/2023   (No Known Allergies)    Family History  Problem Relation Age of Onset   Breast cancer Mother 79   Heart disease Father    Migraines Father    Heart disease Brother    Prostate cancer Brother    Heart attack Brother    Leukemia Maternal Grandmother    Heart disease Maternal Grandfather    Heart disease Other        positive for cardiac disease and both brothers have had cardiac events   Allergic rhinitis Neg Hx    Angioedema Neg Hx    Asthma Neg Hx    Atopy Neg Hx     Immunodeficiency Neg Hx    Eczema Neg Hx    Urticaria Neg Hx    Colon cancer Neg Hx    Colon polyps Neg Hx    Esophageal cancer Neg Hx    Rectal cancer Neg Hx    Stomach cancer Neg Hx     Social History   Socioeconomic History   Marital status: Divorced    Spouse name: n/a   Number of children: 2   Years of education: Masters degree   Highest education level: Master's degree (e.g., MA, MS, MEng, MEd, MSW, MBA)  Occupational History   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   Drug use: Never   Sexual activity: Not Currently    Birth control/protection: Condom  Other Topics Concern   Not on file  Social History Narrative   ** Merged History Encounter **       Lives in a one level home with son, and dog, Chief Operating Officer. Works out of the house. 2 steps into house. Smoke alarms in house. No grab bars in bathroom. Has some area rugs and throw rugs with backing.      No pets of his own. Restores antique pens,  does like to walk and cycle but having problems with peripheral neuropathy.       Firearm is safely stored.      Wears seat belts in vehicle. Does use sunblock and tries to avoid sun due to vitiligo and previous sunburns.      Eats varied diet. Likes protein, fresh fruits and veggies. Watches fats, saturated fats. Drink a lot of water.    Social Drivers of Research scientist (physical sciences) Strain: Low Risk  (10/06/2023)   Overall Financial Resource Strain (CARDIA)    Difficulty of Paying Living Expenses: Not hard at all  Food Insecurity: No Food Insecurity (10/06/2023)   Hunger Vital Sign    Worried About Running Out of Food in the Last Year: Never true    Ran Out of Food in the Last Year: Never true  Transportation Needs: No Transportation Needs (10/06/2023)   PRAPARE - Administrator, Civil Service (Medical): No    Lack of Transportation (Non-Medical): No  Physical Activity: Inactive (10/06/2023)   Exercise Vital Sign    Days of Exercise per Week: 0 days    Minutes of Exercise per Session: 20 min  Stress: No Stress Concern Present (09/30/2023)   [employer] of Occupational Health - Occupational Stress Questionnaire    Feeling of Stress: Not at all  Social Connections: Moderately Integrated (10/06/2023)   Social Connection and Isolation Panel    Frequency of Communication with Friends and Family: Twice a week    Frequency of Social Gatherings with Friends and Family: Once a week    Attends Religious Services: More than 4 times per year    Active Member of [employer] or Organizations: No    Attends Engineer, structural: More than 4 times per year    Marital Status: Divorced  Recent Concern: Social Connections - Moderately Isolated (09/30/2023)   Social Connection and Isolation Panel    Frequency of Communication with Friends and Family: Twice a week    Frequency of Social Gatherings with Friends and Family: Once a week    Attends Religious Services: More than 4 times per year    Active Member of [employer] or Organizations: No    Attends Engineer, structural: Not on file    Marital Status: Divorced  Intimate Partner Violence: Not At Risk (10/06/2023)   Humiliation, Afraid, Rape, and Kick questionnaire    Fear of Current or Ex-Partner: No    Emotionally Abused: No    Physically Abused: No    Sexually Abused: No    Physical Exam: Vital  signs in last 24 hours: @BP  (!) 178/88   Pulse 76   Temp 98 F (36.7 C) (Temporal)   Ht 5' 9.5 (1.765 m)   Wt 254 lb (115.2 kg)   SpO2 98%   BMI 36.97 kg/m  GEN: NAD EYE: Sclerae anicteric ENT: MMM CV: Non-tachycardic Pulm: CTA b/l GI: Soft, NT/ND NEURO:  Alert & Oriented x 3   Sandor Flatter, DO [facility]   11/07/2023 1:20 PM

## 2023-11-11 ENCOUNTER — Telehealth: Payer: Self-pay

## 2023-11-11 ENCOUNTER — Other Ambulatory Visit: Payer: Self-pay | Admitting: Gastroenterology

## 2023-11-11 DIAGNOSIS — K635 Polyp of colon: Secondary | ICD-10-CM | POA: Diagnosis not present

## 2023-11-11 DIAGNOSIS — D125 Benign neoplasm of sigmoid colon: Secondary | ICD-10-CM | POA: Diagnosis not present

## 2023-11-11 DIAGNOSIS — K319 Disease of stomach and duodenum, unspecified: Secondary | ICD-10-CM | POA: Diagnosis not present

## 2023-11-11 DIAGNOSIS — D123 Benign neoplasm of transverse colon: Secondary | ICD-10-CM | POA: Diagnosis not present

## 2023-11-11 DIAGNOSIS — K317 Polyp of stomach and duodenum: Secondary | ICD-10-CM | POA: Diagnosis not present

## 2023-11-11 DIAGNOSIS — D122 Benign neoplasm of ascending colon: Secondary | ICD-10-CM | POA: Diagnosis not present

## 2023-11-11 DIAGNOSIS — D120 Benign neoplasm of cecum: Secondary | ICD-10-CM | POA: Diagnosis not present

## 2023-11-11 NOTE — Telephone Encounter (Signed)
 Left message

## 2023-11-11 NOTE — Addendum Note (Signed)
 Addended by: Sander Speckman W on: 11/11/2023 04:57 PM   Modules accepted: Orders

## 2023-11-13 ENCOUNTER — Ambulatory Visit: Payer: Self-pay | Admitting: Gastroenterology

## 2023-11-13 DIAGNOSIS — D509 Iron deficiency anemia, unspecified: Secondary | ICD-10-CM

## 2023-11-13 LAB — SURGICAL PATHOLOGY

## 2023-11-19 ENCOUNTER — Encounter: Payer: Self-pay | Admitting: Student

## 2023-11-19 DIAGNOSIS — I10 Essential (primary) hypertension: Secondary | ICD-10-CM

## 2023-11-20 MED ORDER — CARVEDILOL 6.25 MG PO TABS: 6.2500 mg | ORAL_TABLET | Freq: Two times a day (BID) | ORAL | 1 refills | Status: DC

## 2023-11-28 ENCOUNTER — Other Ambulatory Visit: Payer: Self-pay | Admitting: Cardiovascular Disease

## 2023-12-02 ENCOUNTER — Encounter: Payer: Self-pay | Admitting: Student

## 2023-12-05 ENCOUNTER — Ambulatory Visit: Admitting: Physician Assistant

## 2023-12-11 ENCOUNTER — Ambulatory Visit (INDEPENDENT_AMBULATORY_CARE_PROVIDER_SITE_OTHER): Admitting: Gastroenterology

## 2023-12-11 DIAGNOSIS — K3189 Other diseases of stomach and duodenum: Secondary | ICD-10-CM | POA: Diagnosis not present

## 2023-12-11 DIAGNOSIS — D500 Iron deficiency anemia secondary to blood loss (chronic): Secondary | ICD-10-CM

## 2023-12-11 NOTE — Progress Notes (Signed)
 SN: DPM-SVC-Q Exp: 06/17/2024 LOT: [id number]  Patient arrived for VCE. Reported the prep went well. This RN explained capsule dietary restrictions for the next few hours. Pt advised to return at 4 pm to return capsule equipment.  Patient verbalized understanding. Opened capsule, ensured capsule was flashing prior to the patient swallowing the capsule. Patient swallowed capsule without difficulty.  Patient told to call the office with any questions and if capsule has not passed after 72 hours. No further questions by the conclusion of the visit.

## 2023-12-11 NOTE — Patient Instructions (Signed)
 You may have clear liquids beginning at 10:30 am after ingesting the capsule.    You can have a light lunch at 12:30 pm; sandwich and half bowl of soup.  Return to the office at 4 pm to return the equipment.   Return to you normal diet at 5 pm.   Call [phone] and ask for Daphne Moats RN if you have any questions.  You should pass the capsule in your stool 8-48 hours after ingestion. If you have not passed the capsule, after 72 hours, please contact the office at [phone].

## 2023-12-12 ENCOUNTER — Other Ambulatory Visit: Payer: Self-pay | Admitting: Cardiovascular Disease

## 2023-12-17 NOTE — Progress Notes (Signed)
 Video Capsule Endoscopy completed and images reviewed and notable for the following:  Findings: 1) Complete capsule study with adequate prep.  First duodenal image at 1 minute 30 seconds.  First cecal image at 5 hours 17 minutes for total small bowel transit of 5 hours 15 minutes. 2) Prominent fold versus submucosal lesion with erosion/ulceration at 2 hours 9 minutes 3) Tiny distal small bowel polypoid lymphoid tissue  Summary: Complete capsule study for iron deficiency anemia.  Small area of prominent fold versus submucosal lesion with erosion located 2 hours into the small bowel.  Otherwise, no small bowel AVMs, active bleeding, or blood noted throughout the small bowel.  Recommendations: - Continue oral iron - Repeat CBC and iron panel in 3 months - The area of possible ulceration/erosion is likely located in the mid small bowel and I suspect outside the reach of single balloon enteroscopy.  Can further evaluate with CT enterography for additional visualization and attempt at localization.  Could also consider referral for double-balloon enteroscopy   Sandor Flatter, DO, [facility]

## 2023-12-18 ENCOUNTER — Telehealth: Payer: Self-pay

## 2023-12-18 DIAGNOSIS — D500 Iron deficiency anemia secondary to blood loss (chronic): Secondary | ICD-10-CM

## 2023-12-18 NOTE — Telephone Encounter (Signed)
 Left message for patient to return call to further discuss results.  Will continue efforts.

## 2023-12-18 NOTE — Telephone Encounter (Signed)
-----   Message from Sandor LULLA Flatter sent at 12/17/2023  5:15 PM EDT ----- Please contact patient and let him know video Capsule Endoscopy completed and images reviewed and notable for the following:  Findings: 1) Complete capsule study with adequate prep.  First duodenal image at 1 minute 30 seconds.  First cecal image at 5 hours 17 minutes for total small bowel transit of 5 hours 15 minutes. 2) Prominent fold versus submucosal lesion with erosion/ulceration at 2 hours 9 minutes 3) Tiny distal small bowel polypoid lymphoid tissue  Summary: Complete capsule study for iron deficiency anemia.  Small area of prominent fold versus submucosal lesion with erosion located 2 hours into the small bowel.  Otherwise, no small bowel AVMs, active bleeding, or blood noted throughout the small bowel.  Recommendations: - Continue oral iron - Repeat CBC and iron panel in 3 months - The area of possible ulceration/erosion is likely located in the mid small bowel and I suspect outside the reach of single balloon enteroscopy.  Can further evaluate with CT enterography for additional visualization and attempt at localization.  Could also consider referral for double-balloon enteroscopy  Thanks.

## 2023-12-22 NOTE — Telephone Encounter (Signed)
 Patient returning call Requesting a call back  Please advise  Thank you

## 2023-12-23 NOTE — Telephone Encounter (Signed)
 Attempted to reach patient by phone, but there was no answer and voicemail was not working.  Will continue efforts.

## 2023-12-24 ENCOUNTER — Other Ambulatory Visit: Payer: Self-pay

## 2023-12-24 DIAGNOSIS — D500 Iron deficiency anemia secondary to blood loss (chronic): Secondary | ICD-10-CM

## 2023-12-24 NOTE — Telephone Encounter (Signed)
 Patient informed of results of capsule endoscopy per Dr San. Patient advised that CT enterography has been scheduled on 01-01-24 at [facility] at 2:30pm.  Patient advised to arrive at 1 pm.    Patient advised that he will need lab work prior to CT enterography.  Patient instructed to go to our lab for lab work and that no appointment is needed.  Reminder sent to patient in MyChart.  Patient agreed to plan and verbalized understanding.  No further questions or concerns.

## 2023-12-25 ENCOUNTER — Other Ambulatory Visit (INDEPENDENT_AMBULATORY_CARE_PROVIDER_SITE_OTHER)

## 2023-12-25 DIAGNOSIS — D500 Iron deficiency anemia secondary to blood loss (chronic): Secondary | ICD-10-CM

## 2023-12-25 LAB — BASIC METABOLIC PANEL WITH GFR
BUN: 12 mg/dL (ref 6–23)
CO2: 27 meq/L (ref 19–32)
Calcium: 9.4 mg/dL (ref 8.4–10.5)
Chloride: 101 meq/L (ref 96–112)
Creatinine, Ser: 1.32 mg/dL (ref 0.40–1.50)
GFR: 53.24 mL/min — ABNORMAL LOW (ref >60.00)
Glucose, Bld: 97 mg/dL (ref 70–99)
Potassium: 4.6 meq/L (ref 3.5–5.1)
Sodium: 137 meq/L (ref 135–145)

## 2023-12-26 NOTE — Progress Notes (Unsigned)
 12/29/2023 Gregory Hansen [id number] 04-25-1950  Referring provider: Howell Lunger, DO Primary GI doctor: Dr. San (Dr. Teressa)  ASSESSMENT AND PLAN:  IDA 09/25/2023  HGB 10.2 MCV 76.9 Platelets 164.0 09/25/2023 Iron 118 Ferritin 15.7  Recent Labs    08/07/23 1455 08/07/23 1600 09/25/23 1439  HGB 9.9* 9.5* 10.2*  11/07/2023 EGD normal esophagus, gastritis, single gastric polyp normal duodenum negative celiac benign polyp negative H. pylori negative metaplasia 11/07/2023 colonoscopy good bowel prep 2 colonic AVMs, 4 polyps 3 to 6 mm transverse colon 2 polyps 3 to 4 mm sigmoid colon diverticulosis nonbleeding internal hemorrhoids significant looping of the colon.  TA polyp negative high-grade, HP recall 5 years 12/11/2023 capsule endoscopy had adequate prep.  Small area of prominent fold versus submucosal lesion with erosion located 2 hours in the small bowel otherwise no small bowel AVM active bleeding bloody etc.   On miralax  as needed 4-5 days a week, denies diarrhea, overt GI bleeding CTE scheduled 01/01/2024   -consider double-balloon enteroscopy -continue iron every other day -Recheck iron, ferritin  GERD 11/07/2023 EGD normal esophagus, gastritis, single gastric polyp normal duodenum negative celiac benign polyp negative H. pylori negative metaplasia Has been on pantoprazole  40 mg once daily for years Well controlled No melena, N,V, dysphagia  Personal history of tubular adenomatous polyps No family history of colon cancer 02/07/2020 colonoscopy for FOBT positive stool four TA polyps 2 to 5 mm descending transverse, diverticulosis left colon, internal hemorrhoids 11/07/2023 colonoscopy good bowel prep 2 colonic AVMs, 4 polyps 3 to 6 mm transverse colon 2 polyps 3 to 4 mm sigmoid colon diverticulosis nonbleeding internal hemorrhoids significant looping of the colon.  TA polyp negative high-grade, HP recall 5 years  CAD status post bypass 2015 and multiple PCI  surgeries 08/26/2023 echo EF 60 to 65%, grade 1 diastolic, no AS Follows with Dr. Wonda last seen 09/2022 No chest pain, no SOB  Ankylosing spondylitis Not on any medications, has walking stick He is not on any medications at this time  Hepatic steatosis without elevation Seen on liver Doppler 01/2019, mild splenomegaly patient had acute transaminitis secondary to rhabdo/fall negative hepatitis panel    Latest Ref Rng & Units 09/25/2023    2:39 PM 08/07/2023    2:55 PM 09/30/2022   10:15 AM  Hepatic Function  Total Protein 6.0 - 8.3 g/dL 7.0  7.2  7.2   Albumin  3.5 - 5.2 g/dL 3.9  4.0  3.7   AST 0 - 37 U/L 15  24  37   ALT 0 - 53 U/L 12  17  23    Alk Phosphatase 39 - 117 U/L 80  82  74   Total Bilirubin 0.2 - 1.2 mg/dL 0.6  0.5  0.3   Bilirubin, Direct 0.0 - 0.3 mg/dL 0.2      Platelets 762  FIB 4 = 1.79  - need LFTs and CBC monitored every 6 months, - repeat AB US , check INR - consider ELF/fibrosure/elastography -Continue to work on risk factor modification including diet exercise and control of risk factors including blood sugars.  Patient Care Team: Howell Lunger, DO as PCP - General (Family Medicine) Wonda Sharper, MD as PCP - Cardiology (Cardiology) Elihue Mitzie POUR, MD as Referring Physician (Ophthalmology) Windle Fret, NP (Inactive) as Nurse Practitioner (Nurse Practitioner) Cary Doffing, MD as Consulting Physician (Dermatology) Gaspar Kung, MD as Referring Physician (Orthopedic Surgery) Mincey, Prentice Clack, MD as Consulting Physician (Ophthalmology) Dolphus Reiter, MD as Consulting Physician (Rheumatology)  HISTORY OF PRESENT ILLNESS: 73 y.o. male with a past medical history listed below presents for evaluation of IDA.   Last seen in the office in 2021 by Dr. Teressa for a colonoscopy.  Patient last seen in the office 12/11/2023 by Dr. San for capsule endoscopy had adequate prep.  Small area of prominent fold versus submucosal lesion with erosion  located 2 hours in the small bowel otherwise no small bowel AVM active bleeding bloody etc.  CTE scheduled 01/01/2024 consider double-balloon enteroscopy  Discussed the use of AI scribe software for clinical note transcription with the patient, who gave verbal consent to proceed.  History of Present Illness   Gregory Hansen is a 73 year old male with chronic kidney disease who presents for a follow-up visit.  He is here to discuss recent test results. Recent blood tests showed low kidney function. He recalls a previous instance of low kidney function that improved on a subsequent test. He is currently taking iron supplements every other day, which has previously improved his iron levels.  He experiences pain in the mornings, initially fearing it was related to his kidneys, but otherwise feels well and sleeps adequately. No abdominal pain, black stools, blood in the stool, shortness of breath, or chest pain. He occasionally uses Miralax , about four to five days a week, to manage bowel movements and reports no constipation or diarrhea.  He has a history of reflux managed with Protonix  and is also on metoprolol . He continues to drink a small amount of low-proof wine. His liver function was normal in July.  He underwent a capsule endoscopy which showed a potential lesion or fold in the small bowel.      He  reports that he quit smoking about 44 years ago. His smoking use included cigarettes. He started smoking about 54 years ago. He has a 20 pack-year smoking history. He has been exposed to tobacco smoke. He has never used smokeless tobacco. He reports current alcohol use of about 14.0 standard drinks of alcohol per week. He reports that he does not use drugs.  RELEVANT GI HISTORY, IMAGING AND LABS: Results   LABS Creatinine: 1.32 (12/22/2023)  DIAGNOSTIC Capsule Endoscopy: Possible lesion or fold in the small intestine two hours into transit      CBC    Component Value Date/Time   WBC 5.1  09/25/2023 1439   RBC 4.29 09/25/2023 1439   HGB 10.2 (L) 09/25/2023 1439   HGB 9.9 (L) 08/07/2023 1455   HCT 33.0 (L) 09/25/2023 1439   HCT 33.9 (L) 08/07/2023 1455   PLT 164.0 09/25/2023 1439   PLT 237 08/07/2023 1455   MCV 76.9 (L) 09/25/2023 1439   MCV 79 08/07/2023 1455   MCH 23.2 (L) 08/07/2023 1455   MCH 24.4 (L) 10/31/2020 1333   MCHC 30.8 09/25/2023 1439   RDW 20.4 (H) 09/25/2023 1439   RDW 19.1 (H) 08/07/2023 1455   LYMPHSABS 1.0 09/25/2023 1439   LYMPHSABS 1.2 08/07/2023 1455   MONOABS 0.7 09/25/2023 1439   EOSABS 0.6 09/25/2023 1439   EOSABS 0.4 08/07/2023 1455   BASOSABS 0.1 09/25/2023 1439   BASOSABS 0.1 08/07/2023 1455   Recent Labs    08/07/23 1455 08/07/23 1600 09/25/23 1439  HGB 9.9* 9.5* 10.2*    CMP     Component Value Date/Time   NA 137 12/25/2023 1446   NA 133 (L) 08/11/2023 1047   K 4.6 12/25/2023 1446   CL 101 12/25/2023 1446   CO2 27 12/25/2023  1446   GLUCOSE 97 12/25/2023 1446   BUN 12 12/25/2023 1446   BUN 18 08/11/2023 1047   CREATININE 1.32 12/25/2023 1446   CREATININE 1.04 10/31/2020 1333   CALCIUM  9.4 12/25/2023 1446   PROT 7.0 09/25/2023 1439   PROT 7.2 08/07/2023 1455   ALBUMIN  3.9 09/25/2023 1439   ALBUMIN  4.0 08/07/2023 1455   AST 15 09/25/2023 1439   ALT 12 09/25/2023 1439   ALKPHOS 80 09/25/2023 1439   BILITOT 0.6 09/25/2023 1439   BILITOT 0.5 08/07/2023 1455   GFRNONAA 91 07/24/2020 1450   GFRAA 105 07/24/2020 1450      Latest Ref Rng & Units 09/25/2023    2:39 PM 08/07/2023    2:55 PM 09/30/2022   10:15 AM  Hepatic Function  Total Protein 6.0 - 8.3 g/dL 7.0  7.2  7.2   Albumin  3.5 - 5.2 g/dL 3.9  4.0  3.7   AST 0 - 37 U/L 15  24  37   ALT 0 - 53 U/L 12  17  23    Alk Phosphatase 39 - 117 U/L 80  82  74   Total Bilirubin 0.2 - 1.2 mg/dL 0.6  0.5  0.3   Bilirubin, Direct 0.0 - 0.3 mg/dL 0.2         Current Medications:    Current Outpatient Medications (Cardiovascular):    carvedilol  (COREG ) 6.25 MG tablet,  Take 1 tablet (6.25 mg total) by mouth 2 (two) times daily with a meal.   nitroGLYCERIN  (NITROSTAT ) 0.4 MG SL tablet, DISSOLVE 1 TAB UNDER TONGUE FOR CHEST PAIN - IF PAIN REMAINS AFTER 5 MIN, CALL 911 AND REPEAT DOSE. MAX 3 TABS IN 15 MINUTES - NEED APPOINTMENT FOR FURTHER REFILLS   rosuvastatin  (CRESTOR ) 20 MG tablet, TAKE 1 TABLET BY MOUTH DAILY   sildenafil  (REVATIO ) 20 MG tablet, TAKE 2-5 TABLETS BY MOUTH AS NEEDED PRIOR TO SEXUAL ACTIVITY   Current Outpatient Medications (Analgesics):    aspirin  81 MG tablet, Take 81 mg by mouth every evening.   Current Outpatient Medications (Hematological):    ferrous gluconate  (FERGON) 324 MG tablet, Take 1 tablet (324 mg total) by mouth every other day.   vitamin B-12 (CYANOCOBALAMIN ) 1000 MCG tablet, Take 1,000 mcg by mouth daily.  Current Outpatient Medications (Other):    buPROPion  (WELLBUTRIN  XL) 300 MG 24 hr tablet, Take 300 mg by mouth daily.   Calcium -Magnesium -Vitamin D (CALCIUM  1200+D3 PO), 2 (two) times daily.   OVER THE COUNTER MEDICATION, Pain relieving foot cream   pantoprazole  (PROTONIX ) 40 MG tablet, TAKE 1 TABLET BY MOUTH DAILY   Polyethylene Glycol 3350  POWD, daily.   Vilazodone HCl 20 MG TABS, Take 1 tablet by mouth daily.  Medical History:  Past Medical History:  Diagnosis Date   Acute hepatitis 01/26/2019   Acute kidney failure 03/30/2018   AKI (acute kidney injury)    Alcohol use disorder, mild, abuse 10/14/2019   Anemia    Anginal pain    Anxiety    Arthritis    SPINE    Arthritis    Cataract    bilateral removed   Cellulitis 03/30/2018   Coronary artery disease    s/p multiple percutaneous interventions, PCI 202 for DMI and DES distal RCA and CFX-OM 2006   Coronary artery disease    Depression    Depression, major, single episode, mild 06/15/2008   Qualifier: Diagnosis of  By: Wynetta, CMA, Carol     GERD (gastroesophageal reflux disease)    Hyperlipidemia  mixed   Hyperlipidemia    Hypertension    Major  depressive disorder, single episode 06/15/2008   Overview:  Overview:  Qualifier: Diagnosis of  By: Wynetta, CMA, Carol    MI (myocardial infarction) (HCC)    Myocardial infarction (HCC)    ? 2006   Numbness and tingling in right hand 07/13/2019   Peripheral neuropathy    calves and both feet   Peripheral neuropathy    calves and both feet   Puncture wound of right foot 03/07/2020   Rhabdomyolysis 02/02/2019   Severe sepsis with acute organ dysfunction (HCC) 12/03/2016   Spondylitis, ankylosing (HCC)    Allergies: No Known Allergies   Surgical History:  He  has a past surgical history that includes Cardiac catheterization; Coronary angioplasty with stent; Coronary angioplasty with stent (03/26/2013); Finger surgery; Coronary artery bypass graft (N/A, 10/29/2013); Intraoprative transesophageal echocardiogram (N/A, 10/29/2013); left heart catheterization with coronary angiogram (N/A, 10/01/2011); left heart catheterization with coronary angiogram (N/A, 03/26/2013); left heart catheterization with coronary angiogram (N/A, 10/25/2013); Coronary artery bypass graft; Coronary angioplasty with stent; Knee arthroscopy w/ laser (Left, 2003); Tonsillectomy; Achilles tendon repair; Cataract extraction (Bilateral); and Colonoscopy w/ polypectomy (2021). Family History:  His family history includes Breast cancer (age of onset: 57) in his mother; Heart attack in his brother; Heart disease in his brother, father, maternal grandfather, and another family member; Leukemia in his maternal grandmother; Migraines in his father; Prostate cancer in his brother.  REVIEW OF SYSTEMS  : All other systems reviewed and negative except where noted in the History of Present Illness.  PHYSICAL EXAM: BP (!) 140/80   Pulse 73   Ht 6' 2 (1.88 m)   Wt 256 lb (116.1 kg)   BMI 32.87 kg/m  Physical Exam   GENERAL APPEARANCE: Well nourished, in no apparent distress HEENT: No cervical lymphadenopathy, unremarkable thyroid ,  sclerae anicteric, conjunctiva pink RESPIRATORY: Respiratory effort normal, BS equal bilateral without rales, rhonchi, wheezing CARDIO: RRR with no MRGs, peripheral pulses intact ABDOMEN: Soft, non distended, active bowel sounds in all 4 quadrants, no tenderness to palpation, no rebound, no mass appreciated RECTAL: declines MUSCULOSKELETAL: Full ROM, normal gait, without edema SKIN: Dry, intact without rashes or lesions. No jaundice. NEURO: Alert, oriented, no focal deficits PSYCH: Cooperative, normal mood and affect.      Alan JONELLE Coombs, PA-C 9:18 AM

## 2023-12-29 ENCOUNTER — Other Ambulatory Visit

## 2023-12-29 ENCOUNTER — Encounter: Payer: Self-pay | Admitting: Physician Assistant

## 2023-12-29 ENCOUNTER — Ambulatory Visit: Admitting: Physician Assistant

## 2023-12-29 ENCOUNTER — Ambulatory Visit: Payer: Self-pay | Admitting: Physician Assistant

## 2023-12-29 VITALS — BP 140/80 | HR 73 | Ht 74.0 in | Wt 256.0 lb

## 2023-12-29 DIAGNOSIS — Z860101 Personal history of adenomatous and serrated colon polyps: Secondary | ICD-10-CM

## 2023-12-29 DIAGNOSIS — D509 Iron deficiency anemia, unspecified: Secondary | ICD-10-CM

## 2023-12-29 DIAGNOSIS — M459 Ankylosing spondylitis of unspecified sites in spine: Secondary | ICD-10-CM

## 2023-12-29 DIAGNOSIS — K760 Fatty (change of) liver, not elsewhere classified: Secondary | ICD-10-CM

## 2023-12-29 DIAGNOSIS — I2510 Atherosclerotic heart disease of native coronary artery without angina pectoris: Secondary | ICD-10-CM

## 2023-12-29 DIAGNOSIS — K5730 Diverticulosis of large intestine without perforation or abscess without bleeding: Secondary | ICD-10-CM | POA: Diagnosis not present

## 2023-12-29 DIAGNOSIS — K219 Gastro-esophageal reflux disease without esophagitis: Secondary | ICD-10-CM

## 2023-12-29 DIAGNOSIS — D500 Iron deficiency anemia secondary to blood loss (chronic): Secondary | ICD-10-CM

## 2023-12-29 LAB — CBC WITH DIFFERENTIAL/PLATELET
Basophils Absolute: 0 K/uL (ref 0.0–0.1)
Basophils Relative: 0.9 % (ref 0.0–3.0)
Eosinophils Absolute: 0.5 K/uL (ref 0.0–0.7)
Eosinophils Relative: 9.8 % — ABNORMAL HIGH (ref 0.0–5.0)
HCT: 38.4 % — ABNORMAL LOW (ref 39.0–52.0)
Hemoglobin: 11.9 g/dL — ABNORMAL LOW (ref 13.0–17.0)
Lymphocytes Relative: 24.1 % (ref 12.0–46.0)
Lymphs Abs: 1.3 K/uL (ref 0.7–4.0)
MCHC: 31.1 g/dL (ref 30.0–36.0)
MCV: 80.4 fl (ref 78.0–100.0)
Monocytes Absolute: 0.6 K/uL (ref 0.1–1.0)
Monocytes Relative: 11.1 % (ref 3.0–12.0)
Neutro Abs: 2.9 K/uL (ref 1.4–7.7)
Neutrophils Relative %: 54.1 % (ref 43.0–77.0)
Platelets: 169 K/uL (ref 150.0–400.0)
RBC: 4.78 Mil/uL (ref 4.22–5.81)
RDW: 20.7 % — ABNORMAL HIGH (ref 11.5–15.5)
WBC: 5.3 K/uL (ref 4.0–10.5)

## 2023-12-29 LAB — IBC + FERRITIN
Ferritin: 15.2 ng/mL — ABNORMAL LOW (ref 22.0–322.0)
Iron: 24 ug/dL — ABNORMAL LOW (ref 42–165)
Saturation Ratios: 5.3 % — ABNORMAL LOW (ref 20.0–50.0)
TIBC: 453.6 ug/dL — ABNORMAL HIGH (ref 250.0–450.0)
Transferrin: 324 mg/dL (ref 212.0–360.0)

## 2023-12-29 LAB — COMPREHENSIVE METABOLIC PANEL WITH GFR
ALT: 14 U/L (ref 0–53)
AST: 19 U/L (ref 0–37)
Albumin: 4.1 g/dL (ref 3.5–5.2)
Alkaline Phosphatase: 75 U/L (ref 39–117)
BUN: 10 mg/dL (ref 6–23)
CO2: 27 meq/L (ref 19–32)
Calcium: 9.4 mg/dL (ref 8.4–10.5)
Chloride: 98 meq/L (ref 96–112)
Creatinine, Ser: 1.09 mg/dL (ref 0.40–1.50)
GFR: 66.98 mL/min (ref >60.00)
Glucose, Bld: 112 mg/dL — ABNORMAL HIGH (ref 70–99)
Potassium: 4.5 meq/L (ref 3.5–5.1)
Sodium: 135 meq/L (ref 135–145)
Total Bilirubin: 0.5 mg/dL (ref 0.2–1.2)
Total Protein: 7.6 g/dL (ref 6.0–8.3)

## 2023-12-29 NOTE — Patient Instructions (Addendum)
 Your provider has requested that you go to the basement level for lab work before leaving today. Press B on the elevator. The lab is located at the first door on the left as you exit the elevator.  Miralax  is an osmotic laxative.  It only brings more water into the stool.  This is safe to take daily.  Can take up to 17 gram of miralax  twice a day.  Mix with juice or coffee.  Start 1 capful at night for 3-4 days and reassess your response in 3-4 days.  You can increase and decrease the dose based on your response.  Remember, it can take up to 3-4 days to take effect OR for the effects to wear off.   I often pair this with benefiber in the morning to help assure the stool is not too loose.   Metabolic dysfunction associated seatohepatitis  Now the leading cause of liver failure in the [place] .  It is normally from such risk factors as obesity, diabetes, insulin  resistance, high cholesterol, or metabolic syndrome.  The only definitive therapy is weight loss and exercise.   Suggest walking 20-30 mins daily.  Decreasing carbohydrates, increasing veggies.    Fatty Liver Fatty liver is the accumulation of fat in liver cells. It is also called hepatosteatosis or steatohepatitis. It is normal for your liver to contain some fat. If fat is more than 5 to 10% of your liver's weight, you have fatty liver.  There are often no symptoms (problems) for years while damage is still occurring. People often learn about their fatty liver when they have medical tests for other reasons. Fat can damage your liver for years or even decades without causing problems. When it becomes severe, it can cause fatigue, weight loss, weakness, and confusion. This makes you more likely to develop more serious liver problems. The liver is the largest organ in the body. It does a lot of work and often gives no warning signs when it is sick until late in a disease. The liver has many important jobs including: Breaking  down foods. Storing vitamins, iron, and other minerals. Making proteins. Making bile for food digestion. Breaking down many products including medications, alcohol and some poisons.  PROGNOSIS  Fatty liver may cause no damage or it can lead to an inflammation of the liver. This is, called steatohepatitis.  Over time the liver may become scarred and hardened. This condition is called cirrhosis. Cirrhosis is serious and may lead to liver failure or cancer. NASH is one of the leading causes of cirrhosis. About 10-20% of Americans have fatty liver and a smaller 2-5% has NASH.  TREATMENT  Weight loss, fat restriction, and exercise in overweight patients produces inconsistent results but is worth trying. Good control of diabetes may reduce fatty liver. Eat a balanced, healthy diet. Increase your physical activity. There are no medical or surgical treatments for a fatty liver or NASH, but improving your diet and increasing your exercise may help prevent or reverse some of the damage.  Due to recent changes in healthcare laws, you may see the results of your imaging and laboratory studies on MyChart before your provider has had a chance to review them.  We understand that in some cases there may be results that are confusing or concerning to you. Not all laboratory results come back in the same time frame and the provider may be waiting for multiple results in order to interpret others.  Please give us  48 hours in order  for your provider to thoroughly review all the results before contacting the office for clarification of your results.    I appreciate the  opportunity to care for you  Thank You   [facility]

## 2023-12-29 NOTE — Progress Notes (Signed)
 Agree with the assessment and plan as outlined by Alan Coombs, PA-C. Patient is scheduled for CT enterography this week to evaluate the abnormal VCE findings. If CTE abnormal, will plan on referral to [facility] for double balloon enteroscopy to further evaluate.   Gregory Trull, DO, [facility]

## 2024-01-01 ENCOUNTER — Ambulatory Visit (HOSPITAL_COMMUNITY)
Admission: RE | Admit: 2024-01-01 | Discharge: 2024-01-01 | Disposition: A | Discharge status: Home / Self Care | Source: Ambulatory Visit | Attending: Gastroenterology | Admitting: Gastroenterology

## 2024-01-01 DIAGNOSIS — K7460 Unspecified cirrhosis of liver: Secondary | ICD-10-CM | POA: Insufficient documentation

## 2024-01-01 DIAGNOSIS — D500 Iron deficiency anemia secondary to blood loss (chronic): Secondary | ICD-10-CM | POA: Insufficient documentation

## 2024-01-01 DIAGNOSIS — K5730 Diverticulosis of large intestine without perforation or abscess without bleeding: Secondary | ICD-10-CM | POA: Diagnosis not present

## 2024-01-01 DIAGNOSIS — N3289 Other specified disorders of bladder: Secondary | ICD-10-CM | POA: Diagnosis not present

## 2024-01-01 DIAGNOSIS — M4856XA Collapsed vertebra, not elsewhere classified, lumbar region, initial encounter for fracture: Secondary | ICD-10-CM | POA: Insufficient documentation

## 2024-01-01 MED ORDER — IOHEXOL 300 MG/ML  SOLN
100.0000 mL | Freq: Once | INTRAMUSCULAR | Status: AC | PRN
  Administered 2024-01-01: 100 mL via INTRAVENOUS

## 2024-01-05 ENCOUNTER — Ambulatory Visit: Payer: Self-pay | Admitting: Gastroenterology

## 2024-01-05 ENCOUNTER — Encounter: Payer: Self-pay | Admitting: Gastroenterology

## 2024-01-14 ENCOUNTER — Other Ambulatory Visit: Payer: Self-pay | Admitting: Medical Genetics

## 2024-01-14 DIAGNOSIS — Z006 Encounter for examination for normal comparison and control in clinical research program: Secondary | ICD-10-CM

## 2024-01-24 ENCOUNTER — Other Ambulatory Visit: Payer: Self-pay | Admitting: Cardiovascular Disease

## 2024-02-02 ENCOUNTER — Other Ambulatory Visit: Payer: Self-pay | Admitting: Cardiovascular Disease

## 2024-02-06 ENCOUNTER — Encounter: Payer: Self-pay | Admitting: Cardiovascular Disease

## 2024-02-09 NOTE — Telephone Encounter (Signed)
 Pt of Dr. Wonda. Passed 3rd attempt. Does Dr. Wonda want to refill? Please advise.

## 2024-02-11 MED ORDER — PANTOPRAZOLE SODIUM 40 MG PO TBEC: 40.0000 mg | DELAYED_RELEASE_TABLET | Freq: Every day | ORAL | 0 refills | Status: AC

## 2024-02-14 LAB — GENECONNECT MOLECULAR SCREEN: Genetic Analysis Overall Interpretation: NEGATIVE

## 2024-03-06 DIAGNOSIS — D649 Anemia, unspecified: Secondary | ICD-10-CM

## 2024-03-08 MED ORDER — FERROUS GLUCONATE 324 (38 FE) MG PO TABS: 324.0000 mg | ORAL_TABLET | ORAL | 0 refills | Status: AC

## 2024-03-13 ENCOUNTER — Other Ambulatory Visit: Payer: Self-pay | Admitting: Cardiovascular Disease

## 2024-03-15 ENCOUNTER — Other Ambulatory Visit (INDEPENDENT_AMBULATORY_CARE_PROVIDER_SITE_OTHER)

## 2024-03-15 DIAGNOSIS — D500 Iron deficiency anemia secondary to blood loss (chronic): Secondary | ICD-10-CM

## 2024-03-15 LAB — IBC + FERRITIN
Ferritin: 16.3 ng/mL — ABNORMAL LOW (ref 22.0–322.0)
Iron: 40 ug/dL — ABNORMAL LOW (ref 42–165)
Saturation Ratios: 8.7 % — ABNORMAL LOW (ref 20.0–50.0)
TIBC: 459.2 ug/dL — ABNORMAL HIGH (ref 250.0–450.0)
Transferrin: 328 mg/dL (ref 212.0–360.0)

## 2024-03-16 ENCOUNTER — Ambulatory Visit: Payer: Self-pay | Admitting: Physician Assistant

## 2024-03-16 DIAGNOSIS — K760 Fatty (change of) liver, not elsewhere classified: Secondary | ICD-10-CM

## 2024-03-16 DIAGNOSIS — D500 Iron deficiency anemia secondary to blood loss (chronic): Secondary | ICD-10-CM

## 2024-03-16 DIAGNOSIS — K219 Gastro-esophageal reflux disease without esophagitis: Secondary | ICD-10-CM

## 2024-03-18 ENCOUNTER — Ambulatory Visit: Admitting: Physician Assistant

## 2024-03-22 ENCOUNTER — Encounter: Payer: Self-pay | Admitting: Cardiovascular Disease

## 2024-03-22 ENCOUNTER — Ambulatory Visit: Attending: Cardiovascular Disease | Admitting: Cardiovascular Disease

## 2024-03-22 VITALS — BP 128/82 | HR 70 | Ht 74.0 in | Wt 258.8 lb

## 2024-03-22 DIAGNOSIS — E782 Mixed hyperlipidemia: Secondary | ICD-10-CM | POA: Diagnosis not present

## 2024-03-22 DIAGNOSIS — I700 Atherosclerosis of aorta: Secondary | ICD-10-CM | POA: Diagnosis not present

## 2024-03-22 DIAGNOSIS — I2510 Atherosclerotic heart disease of native coronary artery without angina pectoris: Secondary | ICD-10-CM | POA: Diagnosis not present

## 2024-03-22 DIAGNOSIS — I10 Essential (primary) hypertension: Secondary | ICD-10-CM | POA: Diagnosis not present

## 2024-03-22 NOTE — Assessment & Plan Note (Signed)
 Treated with rosuvastatin  20 mg daily, goal LDL less than 70, lipids followed by PCP.

## 2024-03-22 NOTE — Progress Notes (Signed)
 " Cardiology Office Note:    Date:  03/22/2024   ID:  Gregory Hansen, DOB 1950/05/03, MRN [id number]  PCP:  Howell Lunger, DO   [facility] Providers Cardiologist:  Ozell Fell, MD     Referring MD: Howell Lunger, DO   Chief Complaint  Patient presents with   Coronary Artery Disease    History of Present Illness:    Gregory Hansen is a 74 y.o. male with a hx of:  Coronary artery disease  S/p multiple PCI procedures S/p CABG in 2015 Hypertension  Hyperlipidemia  GERD  Anxiety  DJD Neuropathy S/p fall in 01/2019 >> L1 burst fx, c/b rhabdo, AKI, elevated LFTs Ankylosing spondylitis   The patient is here alone today. He has had a few falls in the past year and has sustained a mild lower leg fracture, rib fracture, and back injury. He didn't require any surgeries.  Overall he is getting along okay he continues to ambulate with a cane for stability.  He reports that he has some weakness in his legs and some limitation related to his ankylosing spondylitis.  He denies any chest pain, chest pressure, or shortness of breath.  Current Medications: Active Medications[1]   Allergies:   Patient has no known allergies.   ROS:   Please see the history of present illness.    All other systems reviewed and are negative.  EKGs/Labs/Other Studies Reviewed:    The following studies were reviewed today: Cardiac Studies & Procedures   ______________________________________________________________________________________________     ECHOCARDIOGRAM  ECHOCARDIOGRAM COMPLETE 08/26/2023  Narrative ECHOCARDIOGRAM REPORT    Patient Name:   Gregory Hansen [facility] Date of Exam: 08/26/2023 Medical Rec #:  [id number]      Height:       74.0 in Accession #:    [id number]     Weight:       254.0 lb Date of Birth:  1951/01/11      BSA:          2.406 m Patient Age:    72 years       BP:           162/83 mmHg Patient Gender: M              HR:           64 bpm. Exam Location:   Outpatient  Procedure: 2D Echo, Cardiac Doppler and Color Doppler (Both Spectral and Color Flow Doppler were utilized during procedure).  Indications:    R06.9 DOE  History:        Patient has prior history of Echocardiogram examinations, most recent 10/19/2021.  Sonographer:    Eva Lash Referring Phys: [phone] LAYMON PARAS MCINTYRE  IMPRESSIONS   1. Left ventricular ejection fraction, by estimation, is 60 to 65%. The left ventricle has normal function. The left ventricle has no regional wall motion abnormalities. There is mild concentric left ventricular hypertrophy of the septal segment. Left ventricular diastolic parameters are consistent with Grade I diastolic dysfunction (impaired relaxation). 2. Right ventricular systolic function is normal. The right ventricular size is normal. 3. Left atrial size was mildly dilated. 4. The mitral valve is normal in structure. Trivial mitral valve regurgitation. No evidence of mitral stenosis. 5. The aortic valve is tricuspid. Aortic valve regurgitation is trivial. No aortic stenosis is present. 6. Aortic dilatation noted. There is dilatation of the aortic root, measuring 41 mm. 7. The inferior vena cava is normal in size with greater than 50% respiratory  variability, suggesting right atrial pressure of 3 mmHg.  FINDINGS Left Ventricle: Left ventricular ejection fraction, by estimation, is 60 to 65%. The left ventricle has normal function. The left ventricle has no regional wall motion abnormalities. The left ventricular internal cavity size was normal in size. There is mild concentric left ventricular hypertrophy of the septal segment. Left ventricular diastolic parameters are consistent with Grade I diastolic dysfunction (impaired relaxation). Indeterminate filling pressures.  Right Ventricle: The right ventricular size is normal. No increase in right ventricular wall thickness. Right ventricular systolic function is normal.  Left Atrium: Left  atrial size was mildly dilated.  Right Atrium: Right atrial size was normal in size.  Pericardium: There is no evidence of pericardial effusion.  Mitral Valve: The mitral valve is normal in structure. There is mild thickening of the mitral valve leaflet(s). There is mild calcification of the mitral valve leaflet(s). Trivial mitral valve regurgitation. No evidence of mitral valve stenosis.  Tricuspid Valve: The tricuspid valve is normal in structure. Tricuspid valve regurgitation is trivial. No evidence of tricuspid stenosis.  Aortic Valve: The aortic valve is tricuspid. Aortic valve regurgitation is trivial. No aortic stenosis is present. Aortic valve mean gradient measures 3.0 mmHg. Aortic valve peak gradient measures 5.7 mmHg. Aortic valve area, by VTI measures 3.53 cm.  Pulmonic Valve: The pulmonic valve was normal in structure. Pulmonic valve regurgitation is trivial. No evidence of pulmonic stenosis.  Aorta: Aortic dilatation noted. There is dilatation of the aortic root, measuring 41 mm.  Venous: The inferior vena cava is normal in size with greater than 50% respiratory variability, suggesting right atrial pressure of 3 mmHg.  IAS/Shunts: No atrial level shunt detected by color flow Doppler.   LEFT VENTRICLE PLAX 2D LVIDd:         3.90 cm      Diastology LVIDs:         3.00 cm      LV e' medial:    7.62 cm/s LV PW:         1.20 cm      LV E/e' medial:  11.5 LV IVS:        1.20 cm      LV e' lateral:   7.29 cm/s LVOT diam:     2.60 cm      LV E/e' lateral: 12.1 LV SV:         94 LV SV Index:   39 LVOT Area:     5.31 cm  LV Volumes (MOD) LV vol d, MOD A2C: 102.0 ml LV vol d, MOD A4C: 112.0 ml LV vol s, MOD A2C: 32.0 ml LV vol s, MOD A4C: 40.9 ml LV SV MOD A2C:     70.0 ml LV SV MOD A4C:     112.0 ml LV SV MOD BP:      75.4 ml  RIGHT VENTRICLE RV S prime:     11.00 cm/s TAPSE (M-mode): 1.8 cm  LEFT ATRIUM              Index LA Vol (A2C):   115.0 ml 47.80 ml/m LA  Vol (A4C):   72.5 ml  30.13 ml/m LA Biplane Vol: 94.4 ml  39.24 ml/m AORTIC VALVE AV Area (Vmax):    3.67 cm AV Area (Vmean):   2.77 cm AV Area (VTI):     3.53 cm AV Vmax:           119.00 cm/s AV Vmean:  85.200 cm/s AV VTI:            0.266 m AV Peak Grad:      5.7 mmHg AV Mean Grad:      3.0 mmHg LVOT Vmax:         82.20 cm/s LVOT Vmean:        44.500 cm/s LVOT VTI:          0.177 m LVOT/AV VTI ratio: 0.67  AORTA Ao Asc diam: 3.80 cm  MITRAL VALVE               TRICUSPID VALVE MV Area (PHT): 2.70 cm    TR Peak grad:   14.4 mmHg MV Decel Time: 281 msec    TR Vmax:        190.00 cm/s MV E velocity: 88.00 cm/s MV A velocity: 89.60 cm/s  SHUNTS MV E/A ratio:  0.98        Systemic VTI:  0.18 m Systemic Diam: 2.60 cm  Annabella Scarce MD Electronically signed by Annabella Scarce MD Signature Date/Time: 08/26/2023/2:17:05 PM    Final          ______________________________________________________________________________________________      EKG:        Recent Labs: 08/07/2023: TSH 3.250 12/29/2023: ALT 14; BUN 10; Creatinine, Ser 1.09; Hemoglobin 11.9; Platelets 169.0; Potassium 4.5; Sodium 135  Recent Lipid Panel    Component Value Date/Time   CHOL 100 09/30/2022 1015   TRIG 74 09/30/2022 1015   HDL 57 09/30/2022 1015   CHOLHDL 1.8 09/30/2022 1015   CHOLHDL 2.8 05/25/2015 0937   VLDL 29 05/25/2015 0937   LDLCALC 28 09/30/2022 1015   LDLDIRECT 57 10/14/2019 1221   LDLDIRECT 89.7 08/01/2011 0921     Risk Assessment/Calculations:                Physical Exam:    VS:  BP 128/82 (BP Location: Right Arm, Patient Position: Sitting, Cuff Size: Normal)   Pulse 70   Ht 6' 2 (1.88 m)   Wt 258 lb 12.8 oz (117.4 kg)   SpO2 96%   BMI 33.23 kg/m     Wt Readings from Last 3 Encounters:  03/22/24 258 lb 12.8 oz (117.4 kg)  12/29/23 256 lb (116.1 kg)  11/07/23 254 lb (115.2 kg)     GEN:  Well nourished, well developed in no acute  distress HEENT: Normal NECK: No JVD; No carotid bruits LYMPHATICS: No lymphadenopathy CARDIAC: RRR, no murmurs, rubs, gallops RESPIRATORY:  Clear to auscultation without rales, wheezing or rhonchi  ABDOMEN: Soft, non-tender, non-distended MUSCULOSKELETAL:  No edema; No deformity  SKIN: Warm and dry, chronic stasis changes of the lower extremities noted. NEUROLOGIC:  Alert and oriented x 3 PSYCHIATRIC:  Normal affect   Assessment & Plan Aortic atherosclerosis Continue aspirin  and a statin drug Coronary artery disease involving native coronary artery of native heart without angina pectoris No anginal symptoms.  Patient's status post CABG.  Continue aspirin , rosuvastatin , and carvedilol  Mixed hyperlipidemia Treated with rosuvastatin  20 mg daily, goal LDL less than 70, lipids followed by PCP. Essential hypertension Patient brings in home blood pressure readings.  Most are in good range but he does have occasional readings with systolics greater than 140 mmHg.  He has had some dizziness and this is improved with a lower dose of carvedilol .  I think we should leave his medications as is on carvedilol  6.25 mg twice daily with blood pressure in range today in the office at 128/82 mmHg.  Patient with significant fall risk best to not overtreat him.            Medication Adjustments/Labs and Tests Ordered: Current medicines are reviewed at length with the patient today.  Concerns regarding medicines are outlined above.  No orders of the defined types were placed in this encounter.  No orders of the defined types were placed in this encounter.   Patient Instructions  Medication Instructions:  No medication changes were made at this visit. Continue current regimen.   *If you need a refill on your cardiac medications before your next appointment, please call your pharmacy*  Lab Work: None ordered today. If you have labs (blood work) drawn today and your tests are completely normal, you  will receive your results only by: MyChart Message (if you have MyChart) OR A paper copy in the mail If you have any lab test that is abnormal or we need to change your treatment, we will call you to review the results.  Testing/Procedures: None ordered today.  Follow-Up: At [facility], you and your health needs are our priority.  As part of our continuing mission to provide you with exceptional heart care, our providers are all part of one team.  This team includes your primary Cardiologist (physician) and Advanced Practice Providers or APPs (Physician Assistants and Nurse Practitioners) who all work together to provide you with the care you need, when you need it.  Your next appointment:   1 year(s)  Provider:   Ozell Fell, MD      Signed, Ozell Fell, MD  03/22/2024 12:03 PM    [facility]     [1]  Current Meds  Medication Sig   aspirin  81 MG tablet Take 81 mg by mouth every evening.    buPROPion  (WELLBUTRIN  XL) 300 MG 24 hr tablet Take 300 mg by mouth daily.   Calcium -Magnesium -Vitamin D (CALCIUM  1200+D3 PO) 2 (two) times daily.   carvedilol  (COREG ) 6.25 MG tablet Take 1 tablet (6.25 mg total) by mouth 2 (two) times daily with a meal.   ferrous gluconate  (FERGON) 324 MG tablet Take 1 tablet (324 mg total) by mouth every other day. (Patient taking differently: Take 324 mg by mouth daily.)   nitroGLYCERIN  (NITROSTAT ) 0.4 MG SL tablet DISSOLVE 1 TAB UNDER TONGUE FOR CHEST PAIN - IF PAIN REMAINS AFTER 5 MIN, CALL 911 AND REPEAT DOSE. MAX 3 TABS IN 15 MINUTES - NEED APPOINTMENT FOR FURTHER REFILLS   OVER THE COUNTER MEDICATION Pain relieving foot cream   Polyethylene Glycol 3350  POWD daily.   rosuvastatin  (CRESTOR ) 20 MG tablet TAKE 1 TABLET BY MOUTH DAILY   sildenafil  (REVATIO ) 20 MG tablet TAKE 2-5 TABLETS BY MOUTH AS NEEDED PRIOR TO SEXUAL ACTIVITY   Vilazodone HCl 20 MG TABS Take 1 tablet by mouth daily.   vitamin B-12 (CYANOCOBALAMIN ) 1000 MCG  tablet Take 1,000 mcg by mouth daily.   "

## 2024-03-22 NOTE — Assessment & Plan Note (Signed)
 Continue aspirin  and a statin drug

## 2024-03-22 NOTE — Patient Instructions (Signed)

## 2024-03-22 NOTE — Assessment & Plan Note (Signed)
 No anginal symptoms.  Patient's status post CABG.  Continue aspirin , rosuvastatin , and carvedilol 

## 2024-03-25 ENCOUNTER — Other Ambulatory Visit: Payer: Self-pay | Admitting: Student

## 2024-03-25 DIAGNOSIS — I10 Essential (primary) hypertension: Secondary | ICD-10-CM

## 2024-04-29 ENCOUNTER — Ambulatory Visit: Admitting: Physician Assistant

## 2024-05-04 ENCOUNTER — Ambulatory Visit: Payer: Medicare Other | Admitting: Rheumatology

## 2024-10-07 ENCOUNTER — Encounter
